# Patient Record
Sex: Female | Born: 1965 | Race: White | Hispanic: No | Marital: Married | Smoking: Never smoker
Health system: Southern US, Community
[De-identification: ages and names within clinical notes are randomized; demographics above are authoritative.]

## PROBLEM LIST (undated history)

## (undated) DIAGNOSIS — E282 Polycystic ovarian syndrome: Secondary | ICD-10-CM

## (undated) DIAGNOSIS — T8339XA Other mechanical complication of intrauterine contraceptive device, initial encounter: Secondary | ICD-10-CM

## (undated) DIAGNOSIS — E119 Type 2 diabetes mellitus without complications: Secondary | ICD-10-CM

## (undated) DIAGNOSIS — F32A Depression, unspecified: Secondary | ICD-10-CM

## (undated) DIAGNOSIS — K219 Gastro-esophageal reflux disease without esophagitis: Secondary | ICD-10-CM

## (undated) DIAGNOSIS — F329 Major depressive disorder, single episode, unspecified: Secondary | ICD-10-CM

## (undated) DIAGNOSIS — G894 Chronic pain syndrome: Secondary | ICD-10-CM

## (undated) DIAGNOSIS — M5030 Other cervical disc degeneration, unspecified cervical region: Secondary | ICD-10-CM

## (undated) DIAGNOSIS — I10 Essential (primary) hypertension: Secondary | ICD-10-CM

## (undated) DIAGNOSIS — Z332 Encounter for elective termination of pregnancy: Secondary | ICD-10-CM

## (undated) DIAGNOSIS — J4520 Mild intermittent asthma, uncomplicated: Secondary | ICD-10-CM

## (undated) DIAGNOSIS — J302 Other seasonal allergic rhinitis: Secondary | ICD-10-CM

## (undated) DIAGNOSIS — Z973 Presence of spectacles and contact lenses: Secondary | ICD-10-CM

## (undated) DIAGNOSIS — R7303 Prediabetes: Secondary | ICD-10-CM

## (undated) DIAGNOSIS — M51369 Other intervertebral disc degeneration, lumbar region without mention of lumbar back pain or lower extremity pain: Secondary | ICD-10-CM

## (undated) DIAGNOSIS — F411 Generalized anxiety disorder: Secondary | ICD-10-CM

## (undated) DIAGNOSIS — M5126 Other intervertebral disc displacement, lumbar region: Secondary | ICD-10-CM

## (undated) DIAGNOSIS — IMO0002 Reserved for concepts with insufficient information to code with codable children: Secondary | ICD-10-CM

## (undated) DIAGNOSIS — M5136 Other intervertebral disc degeneration, lumbar region: Secondary | ICD-10-CM

## (undated) DIAGNOSIS — F419 Anxiety disorder, unspecified: Secondary | ICD-10-CM

## (undated) DIAGNOSIS — I639 Cerebral infarction, unspecified: Secondary | ICD-10-CM

## (undated) HISTORY — DX: Other intervertebral disc displacement, lumbar region: M51.26

## (undated) HISTORY — PX: OTHER SURGICAL HISTORY: SHX169

## (undated) HISTORY — DX: Reserved for concepts with insufficient information to code with codable children: IMO0002

## (undated) HISTORY — DX: Depression, unspecified: F32.A

## (undated) HISTORY — PX: WISDOM TOOTH EXTRACTION: SHX21

## (undated) HISTORY — DX: Cerebral infarction, unspecified: I63.9

## (undated) HISTORY — DX: Anxiety disorder, unspecified: F41.9

## (undated) HISTORY — DX: Encounter for elective termination of pregnancy: Z33.2

## (undated) HISTORY — DX: Major depressive disorder, single episode, unspecified: F32.9

## (undated) HISTORY — DX: Essential (primary) hypertension: I10

---

## 1998-10-11 ENCOUNTER — Inpatient Hospital Stay (HOSPITAL_COMMUNITY): Admission: AD | Admit: 1998-10-11 | Discharge: 1998-10-11 | Payer: Self-pay | Admitting: Gynecology

## 1999-01-09 ENCOUNTER — Ambulatory Visit (HOSPITAL_COMMUNITY): Admission: RE | Admit: 1999-01-09 | Discharge: 1999-01-09 | Payer: Self-pay | Admitting: Gynecology

## 1999-01-09 ENCOUNTER — Encounter: Payer: Self-pay | Admitting: Gynecology

## 1999-02-23 HISTORY — PX: CHOLECYSTECTOMY: SHX55

## 1999-03-04 ENCOUNTER — Other Ambulatory Visit: Admission: RE | Admit: 1999-03-04 | Discharge: 1999-03-04 | Payer: Self-pay | Admitting: Internal Medicine

## 1999-06-09 ENCOUNTER — Encounter: Payer: Self-pay | Admitting: Gynecology

## 1999-06-09 ENCOUNTER — Ambulatory Visit (HOSPITAL_COMMUNITY): Admission: RE | Admit: 1999-06-09 | Discharge: 1999-06-09 | Payer: Self-pay | Admitting: Gynecology

## 1999-07-03 ENCOUNTER — Ambulatory Visit (HOSPITAL_COMMUNITY): Admission: RE | Admit: 1999-07-03 | Discharge: 1999-07-03 | Payer: Self-pay | Admitting: Gynecology

## 1999-07-03 ENCOUNTER — Encounter: Payer: Self-pay | Admitting: Gynecology

## 1999-08-04 ENCOUNTER — Inpatient Hospital Stay (HOSPITAL_COMMUNITY): Admission: AD | Admit: 1999-08-04 | Discharge: 1999-08-04 | Payer: Self-pay | Admitting: Gynecology

## 1999-09-24 ENCOUNTER — Inpatient Hospital Stay (HOSPITAL_COMMUNITY): Admission: AD | Admit: 1999-09-24 | Discharge: 1999-09-26 | Payer: Self-pay | Admitting: Gynecology

## 1999-10-22 ENCOUNTER — Encounter (INDEPENDENT_AMBULATORY_CARE_PROVIDER_SITE_OTHER): Payer: Self-pay | Admitting: Specialist

## 1999-10-22 ENCOUNTER — Ambulatory Visit (HOSPITAL_COMMUNITY): Admission: RE | Admit: 1999-10-22 | Discharge: 1999-10-23 | Payer: Self-pay | Admitting: Surgery

## 1999-11-13 ENCOUNTER — Other Ambulatory Visit: Admission: RE | Admit: 1999-11-13 | Discharge: 1999-11-13 | Payer: Self-pay | Admitting: Gynecology

## 1999-11-22 HISTORY — PX: CHOLECYSTECTOMY, LAPAROSCOPIC: SHX56

## 2000-11-18 ENCOUNTER — Other Ambulatory Visit: Admission: RE | Admit: 2000-11-18 | Discharge: 2000-11-18 | Payer: Self-pay | Admitting: Gynecology

## 2001-03-22 ENCOUNTER — Other Ambulatory Visit: Admission: RE | Admit: 2001-03-22 | Discharge: 2001-03-22 | Payer: Self-pay | Admitting: Radiology

## 2001-11-20 ENCOUNTER — Other Ambulatory Visit: Admission: RE | Admit: 2001-11-20 | Discharge: 2001-11-20 | Payer: Self-pay | Admitting: Gynecology

## 2002-12-11 ENCOUNTER — Other Ambulatory Visit: Admission: RE | Admit: 2002-12-11 | Discharge: 2002-12-11 | Payer: Self-pay | Admitting: Gynecology

## 2004-02-05 ENCOUNTER — Other Ambulatory Visit: Admission: RE | Admit: 2004-02-05 | Discharge: 2004-02-05 | Payer: Self-pay | Admitting: Gynecology

## 2005-04-09 ENCOUNTER — Ambulatory Visit: Payer: Self-pay | Admitting: "Endocrinology

## 2005-05-14 ENCOUNTER — Ambulatory Visit: Payer: Self-pay | Admitting: Family Medicine

## 2005-05-17 ENCOUNTER — Other Ambulatory Visit: Admission: RE | Admit: 2005-05-17 | Discharge: 2005-05-17 | Payer: Self-pay | Admitting: Gynecology

## 2005-12-16 ENCOUNTER — Ambulatory Visit: Payer: Self-pay | Admitting: Specialist

## 2006-01-10 ENCOUNTER — Ambulatory Visit: Payer: Self-pay | Admitting: Family Medicine

## 2006-04-23 ENCOUNTER — Encounter (INDEPENDENT_AMBULATORY_CARE_PROVIDER_SITE_OTHER): Payer: Self-pay | Admitting: Internal Medicine

## 2006-04-23 LAB — CONVERTED CEMR LAB: Hgb A1c MFr Bld: 5.4 %

## 2006-05-05 ENCOUNTER — Encounter (INDEPENDENT_AMBULATORY_CARE_PROVIDER_SITE_OTHER): Payer: Self-pay | Admitting: Internal Medicine

## 2006-07-15 ENCOUNTER — Other Ambulatory Visit: Admission: RE | Admit: 2006-07-15 | Discharge: 2006-07-15 | Payer: Self-pay | Admitting: Gynecology

## 2006-09-22 ENCOUNTER — Encounter (INDEPENDENT_AMBULATORY_CARE_PROVIDER_SITE_OTHER): Payer: Self-pay | Admitting: Internal Medicine

## 2006-10-27 ENCOUNTER — Telehealth (INDEPENDENT_AMBULATORY_CARE_PROVIDER_SITE_OTHER): Payer: Self-pay | Admitting: *Deleted

## 2006-11-11 ENCOUNTER — Encounter (INDEPENDENT_AMBULATORY_CARE_PROVIDER_SITE_OTHER): Payer: Self-pay | Admitting: Internal Medicine

## 2006-12-14 ENCOUNTER — Encounter (INDEPENDENT_AMBULATORY_CARE_PROVIDER_SITE_OTHER): Payer: Self-pay | Admitting: Internal Medicine

## 2006-12-14 DIAGNOSIS — E8881 Metabolic syndrome: Secondary | ICD-10-CM | POA: Insufficient documentation

## 2006-12-14 DIAGNOSIS — E669 Obesity, unspecified: Secondary | ICD-10-CM | POA: Insufficient documentation

## 2006-12-14 DIAGNOSIS — E282 Polycystic ovarian syndrome: Secondary | ICD-10-CM | POA: Insufficient documentation

## 2006-12-14 DIAGNOSIS — E119 Type 2 diabetes mellitus without complications: Secondary | ICD-10-CM

## 2006-12-14 DIAGNOSIS — E042 Nontoxic multinodular goiter: Secondary | ICD-10-CM | POA: Insufficient documentation

## 2006-12-14 DIAGNOSIS — J309 Allergic rhinitis, unspecified: Secondary | ICD-10-CM | POA: Insufficient documentation

## 2006-12-14 DIAGNOSIS — R7303 Prediabetes: Secondary | ICD-10-CM | POA: Insufficient documentation

## 2006-12-14 DIAGNOSIS — L680 Hirsutism: Secondary | ICD-10-CM | POA: Insufficient documentation

## 2006-12-14 DIAGNOSIS — J45909 Unspecified asthma, uncomplicated: Secondary | ICD-10-CM | POA: Insufficient documentation

## 2006-12-14 DIAGNOSIS — F411 Generalized anxiety disorder: Secondary | ICD-10-CM | POA: Insufficient documentation

## 2006-12-14 DIAGNOSIS — F419 Anxiety disorder, unspecified: Secondary | ICD-10-CM | POA: Insufficient documentation

## 2006-12-29 ENCOUNTER — Ambulatory Visit: Payer: Self-pay | Admitting: Family Medicine

## 2007-01-02 LAB — CONVERTED CEMR LAB
ALT: 14 units/L (ref 0–35)
AST: 13 units/L (ref 0–37)
Albumin: 3.2 g/dL — ABNORMAL LOW (ref 3.5–5.2)
Alkaline Phosphatase: 76 units/L (ref 39–117)
BUN: 12 mg/dL (ref 6–23)
Basophils Absolute: 0 10*3/uL (ref 0.0–0.1)
Basophils Relative: 0 % (ref 0.0–1.0)
Bilirubin, Direct: 0.1 mg/dL (ref 0.0–0.3)
CO2: 25 meq/L (ref 19–32)
Calcium: 8.5 mg/dL (ref 8.4–10.5)
Chloride: 108 meq/L (ref 96–112)
Cholesterol: 156 mg/dL (ref 0–200)
Creatinine, Ser: 0.8 mg/dL (ref 0.4–1.2)
Eosinophils Absolute: 0.2 10*3/uL (ref 0.0–0.6)
Eosinophils Relative: 1.4 % (ref 0.0–5.0)
GFR calc Af Amer: 102 mL/min
GFR calc non Af Amer: 84 mL/min
Glucose, Bld: 87 mg/dL (ref 70–99)
HCT: 37.5 % (ref 36.0–46.0)
HDL: 49.5 mg/dL (ref >39.0)
Hemoglobin: 13.4 g/dL (ref 12.0–15.0)
LDL Cholesterol: 82 mg/dL (ref 0–99)
Lymphocytes Relative: 16.5 % (ref 12.0–46.0)
MCHC: 35.7 g/dL (ref 30.0–36.0)
MCV: 88.2 fL (ref 78.0–100.0)
Monocytes Absolute: 0.4 10*3/uL (ref 0.2–0.7)
Monocytes Relative: 4.1 % (ref 3.0–11.0)
Neutro Abs: 8.4 10*3/uL — ABNORMAL HIGH (ref 1.4–7.7)
Neutrophils Relative %: 78 % — ABNORMAL HIGH (ref 43.0–77.0)
Platelets: 261 10*3/uL (ref 150–400)
Potassium: 4.1 meq/L (ref 3.5–5.1)
RBC: 4.25 M/uL (ref 3.87–5.11)
RDW: 11.9 % (ref 11.5–14.6)
Sodium: 140 meq/L (ref 135–145)
TSH: 0.66 microintl units/mL (ref 0.35–5.50)
Total Bilirubin: 0.6 mg/dL (ref 0.3–1.2)
Total CHOL/HDL Ratio: 3.2
Total Protein: 6.5 g/dL (ref 6.0–8.3)
Triglycerides: 125 mg/dL (ref 0–149)
VLDL: 25 mg/dL (ref 0–40)
WBC: 10.8 10*3/uL — ABNORMAL HIGH (ref 4.5–10.5)

## 2007-01-04 ENCOUNTER — Telehealth (INDEPENDENT_AMBULATORY_CARE_PROVIDER_SITE_OTHER): Payer: Self-pay | Admitting: Internal Medicine

## 2007-01-24 ENCOUNTER — Telehealth (INDEPENDENT_AMBULATORY_CARE_PROVIDER_SITE_OTHER): Payer: Self-pay | Admitting: *Deleted

## 2007-06-09 ENCOUNTER — Encounter (INDEPENDENT_AMBULATORY_CARE_PROVIDER_SITE_OTHER): Payer: Self-pay | Admitting: Internal Medicine

## 2007-07-14 ENCOUNTER — Ambulatory Visit: Payer: Self-pay | Admitting: Family Medicine

## 2007-07-14 LAB — CONVERTED CEMR LAB
Bilirubin Urine: NEGATIVE
Blood in Urine, dipstick: NEGATIVE
Glucose, Urine, Semiquant: NEGATIVE
Ketones, urine, test strip: NEGATIVE
Nitrite: NEGATIVE
Protein, U semiquant: NEGATIVE
Specific Gravity, Urine: 1.025
Urobilinogen, UA: 0.2
WBC Urine, dipstick: NEGATIVE
pH: 6

## 2007-07-18 LAB — CONVERTED CEMR LAB
ALT: 15 units/L (ref 0–35)
AST: 17 units/L (ref 0–37)
Albumin: 3.5 g/dL (ref 3.5–5.2)
Alkaline Phosphatase: 78 units/L (ref 39–117)
BUN: 15 mg/dL (ref 6–23)
Bilirubin, Direct: 0.1 mg/dL (ref 0.0–0.3)
CO2: 29 meq/L (ref 19–32)
Calcium: 8.7 mg/dL (ref 8.4–10.5)
Chloride: 103 meq/L (ref 96–112)
Cholesterol: 154 mg/dL (ref 0–200)
Creatinine, Ser: 0.8 mg/dL (ref 0.4–1.2)
GFR calc Af Amer: 102 mL/min
GFR calc non Af Amer: 84 mL/min
Glucose, Bld: 77 mg/dL (ref 70–99)
HDL: 52.2 mg/dL (ref >39.0)
Hgb A1c MFr Bld: 5.9 % (ref 4.6–6.0)
LDL Cholesterol: 76 mg/dL (ref 0–99)
Potassium: 3.8 meq/L (ref 3.5–5.1)
Sodium: 134 meq/L — ABNORMAL LOW (ref 135–145)
TSH: 0.55 microintl units/mL (ref 0.35–5.50)
Total Bilirubin: 0.8 mg/dL (ref 0.3–1.2)
Total CHOL/HDL Ratio: 3
Total Protein: 7.1 g/dL (ref 6.0–8.3)
Triglycerides: 127 mg/dL (ref 0–149)
VLDL: 25 mg/dL (ref 0–40)

## 2007-08-11 ENCOUNTER — Other Ambulatory Visit: Admission: RE | Admit: 2007-08-11 | Discharge: 2007-08-11 | Payer: Self-pay | Admitting: Gynecology

## 2007-09-05 ENCOUNTER — Ambulatory Visit: Payer: Self-pay | Admitting: Family Medicine

## 2007-09-05 DIAGNOSIS — N390 Urinary tract infection, site not specified: Secondary | ICD-10-CM | POA: Insufficient documentation

## 2007-09-05 LAB — CONVERTED CEMR LAB
Bilirubin Urine: NEGATIVE
Glucose, Urine, Semiquant: NEGATIVE
Ketones, urine, test strip: NEGATIVE
Nitrite: POSITIVE
Specific Gravity, Urine: 1.02
Urobilinogen, UA: 0.2
pH: 6.5

## 2007-09-18 ENCOUNTER — Encounter (INDEPENDENT_AMBULATORY_CARE_PROVIDER_SITE_OTHER): Payer: Self-pay | Admitting: Internal Medicine

## 2007-09-18 ENCOUNTER — Ambulatory Visit: Payer: Self-pay | Admitting: Family Medicine

## 2007-09-18 DIAGNOSIS — J069 Acute upper respiratory infection, unspecified: Secondary | ICD-10-CM | POA: Insufficient documentation

## 2007-09-18 DIAGNOSIS — M542 Cervicalgia: Secondary | ICD-10-CM | POA: Insufficient documentation

## 2007-09-18 DIAGNOSIS — R21 Rash and other nonspecific skin eruption: Secondary | ICD-10-CM | POA: Insufficient documentation

## 2007-09-19 LAB — CONVERTED CEMR LAB
Basophils Absolute: 0 10*3/uL (ref 0.0–0.1)
Basophils Relative: 0 % (ref 0–1)
Eosinophils Absolute: 0.5 10*3/uL (ref 0.0–0.7)
Eosinophils Relative: 6 % — ABNORMAL HIGH (ref 0–5)
HCT: 39.6 % (ref 36.0–46.0)
Hemoglobin: 13.3 g/dL (ref 12.0–15.0)
Lymphocytes Relative: 24 % (ref 12–46)
Lymphs Abs: 2 10*3/uL (ref 0.7–4.0)
MCHC: 33.6 g/dL (ref 30.0–36.0)
MCV: 87.4 fL (ref 78.0–100.0)
Monocytes Absolute: 0.4 10*3/uL (ref 0.1–1.0)
Monocytes Relative: 5 % (ref 3–12)
Neutro Abs: 5.5 10*3/uL (ref 1.7–7.7)
Neutrophils Relative %: 64 % (ref 43–77)
Platelets: 267 10*3/uL (ref 150–400)
RBC: 4.53 M/uL (ref 3.87–5.11)
RDW: 12.9 % (ref 11.5–15.5)
WBC: 8.5 10*3/uL (ref 4.0–10.5)

## 2007-09-25 ENCOUNTER — Telehealth (INDEPENDENT_AMBULATORY_CARE_PROVIDER_SITE_OTHER): Payer: Self-pay | Admitting: Internal Medicine

## 2007-10-05 ENCOUNTER — Encounter: Payer: Self-pay | Admitting: Family Medicine

## 2007-11-01 ENCOUNTER — Encounter (INDEPENDENT_AMBULATORY_CARE_PROVIDER_SITE_OTHER): Payer: Self-pay | Admitting: Internal Medicine

## 2007-12-21 ENCOUNTER — Telehealth (INDEPENDENT_AMBULATORY_CARE_PROVIDER_SITE_OTHER): Payer: Self-pay | Admitting: *Deleted

## 2008-02-12 ENCOUNTER — Telehealth (INDEPENDENT_AMBULATORY_CARE_PROVIDER_SITE_OTHER): Payer: Self-pay | Admitting: *Deleted

## 2008-05-03 ENCOUNTER — Encounter (INDEPENDENT_AMBULATORY_CARE_PROVIDER_SITE_OTHER): Payer: Self-pay | Admitting: *Deleted

## 2008-07-04 ENCOUNTER — Telehealth (INDEPENDENT_AMBULATORY_CARE_PROVIDER_SITE_OTHER): Payer: Self-pay | Admitting: Internal Medicine

## 2008-07-23 ENCOUNTER — Telehealth (INDEPENDENT_AMBULATORY_CARE_PROVIDER_SITE_OTHER): Payer: Self-pay | Admitting: Internal Medicine

## 2008-08-22 ENCOUNTER — Telehealth (INDEPENDENT_AMBULATORY_CARE_PROVIDER_SITE_OTHER): Payer: Self-pay | Admitting: Internal Medicine

## 2008-08-27 ENCOUNTER — Telehealth (INDEPENDENT_AMBULATORY_CARE_PROVIDER_SITE_OTHER): Payer: Self-pay | Admitting: Internal Medicine

## 2008-08-28 ENCOUNTER — Encounter: Payer: Self-pay | Admitting: Family Medicine

## 2008-10-10 ENCOUNTER — Encounter: Payer: Self-pay | Admitting: Gynecology

## 2008-10-10 ENCOUNTER — Ambulatory Visit: Payer: Self-pay | Admitting: Gynecology

## 2008-10-10 ENCOUNTER — Other Ambulatory Visit: Admission: RE | Admit: 2008-10-10 | Discharge: 2008-10-10 | Payer: Self-pay | Admitting: Gynecology

## 2008-10-10 ENCOUNTER — Encounter (INDEPENDENT_AMBULATORY_CARE_PROVIDER_SITE_OTHER): Payer: Self-pay | Admitting: Internal Medicine

## 2008-11-01 ENCOUNTER — Encounter (INDEPENDENT_AMBULATORY_CARE_PROVIDER_SITE_OTHER): Payer: Self-pay | Admitting: Internal Medicine

## 2008-11-01 ENCOUNTER — Ambulatory Visit: Payer: Self-pay | Admitting: Gynecology

## 2008-11-01 ENCOUNTER — Encounter (INDEPENDENT_AMBULATORY_CARE_PROVIDER_SITE_OTHER): Payer: Self-pay | Admitting: *Deleted

## 2008-11-12 ENCOUNTER — Encounter (INDEPENDENT_AMBULATORY_CARE_PROVIDER_SITE_OTHER): Payer: Self-pay | Admitting: *Deleted

## 2008-11-21 ENCOUNTER — Telehealth (INDEPENDENT_AMBULATORY_CARE_PROVIDER_SITE_OTHER): Payer: Self-pay | Admitting: Internal Medicine

## 2008-11-29 ENCOUNTER — Encounter (INDEPENDENT_AMBULATORY_CARE_PROVIDER_SITE_OTHER): Payer: Self-pay | Admitting: Internal Medicine

## 2008-12-27 ENCOUNTER — Ambulatory Visit: Payer: Self-pay | Admitting: Family Medicine

## 2008-12-27 DIAGNOSIS — E781 Pure hyperglyceridemia: Secondary | ICD-10-CM | POA: Insufficient documentation

## 2009-09-29 ENCOUNTER — Ambulatory Visit: Payer: Self-pay | Admitting: Gynecology

## 2009-10-16 ENCOUNTER — Other Ambulatory Visit: Admission: RE | Admit: 2009-10-16 | Discharge: 2009-10-16 | Payer: Self-pay | Admitting: Gynecology

## 2009-10-16 ENCOUNTER — Ambulatory Visit: Payer: Self-pay | Admitting: Gynecology

## 2010-01-02 ENCOUNTER — Telehealth: Payer: Self-pay | Admitting: Family Medicine

## 2010-03-22 LAB — CONVERTED CEMR LAB: Hgb A1c MFr Bld: 5.7 %

## 2010-03-24 NOTE — Progress Notes (Signed)
Summary: Needs appt  Phone Note Outgoing Call Call back at Home Phone [phone]   Call placed by: DeShannon Smith CMA Duncan Dull),  January 02, 2010 2:38 PM Call placed to: Patient Summary of Call: calling pt to advise she needs to be seen in order to get refill for Metformin, pt needs to establish with a MD. Initial call taken by: Mervin Hack CMA Duncan Dull),  January 02, 2010 2:38 PM  Follow-up for Phone Call        left message on machine at home for patient to return my call.  DeShannon Smith CMA Duncan Dull)  January 02, 2010 2:38 PM   Patient returned call, appt. scheduled for next week w/ Dr. Dayton Martes. (Patient requested female) Follow-up by: Melody Comas,  January 02, 2010 5:16 PM

## 2010-07-10 NOTE — Op Note (Signed)
[facility]  Patient:    Debra Myers, Debra Myers                        MRN: [id number] Proc. Date: 11/22/99 Adm. Date:  [id number] Disc. Date: [id number] Attending:  Abigail Miyamoto A                           Operative Report  PREOPERATIVE DIAGNOSIS:  Symptomatic cholelithiasis.  POSTOPERATIVE DIAGNOSIS:  Symptomatic cholelithiasis.  PROCEDURE:  Laparoscopic cholecystectomy.  SURGEON:  Douglas A. Magnus Ivan, M.D.  ASSISTANT:  Sandria Bales. Ezzard Standing, M.D.  ANESTHESIA:  General endotracheal anesthesia.  PROCEDURE IN DETAIL:  The patient was brought to the operating room, identified as Debra Myers.  She was placed supine on the operating table and general anesthesia was induced.  Her abdomen was then prepped and draped in usual sterile fashion.  Using a #15 blade, a small transverse incision was made just below the umbilicus.  Incision was carried down to the fascia.  The fascia was opened with a scalpel.  A hemostat was then used to pass into the peritoneal cavity.  Next, a 0 Vicryl purse-string suture was placed around the fascia opening.  The Hasson port was then placed through the opening and insufflation of the abdomen was begun.  Next, an 11 mm port was placed in patients epigastrium and two 5 mm ports were placed in the patients right flank, all under direct vision.  The gallbladder was then grasped and retracted above the liver bed.  Dissection was then carried out at the base. The cystic duct was dissected out and clipped three times proximally and once distally and transected with scissors.  The cystic artery was likewise identified and clipped twice proximally and once distally and transected as well.  The gallbladder was then slowly dissected free from the liver bed with the electrocautery.  Hemostasis appeared to be achieved in the liver bed.  The gallbladder was then completely excised and removed through the port at the umbilicus.  The abdomen was  then thoroughly irrigated.  The 0 Vicryl at the umbilicus was then fastened in place.  All ports were then removed under direct vision.  The abdomen was deflated.  All skin incisions were then anesthetized with 0.25% Marcaine and then closed with 4-0 Monocryl subcuticular stitches.  Steri-Strips, gauze and tape were then applied.  The patient tolerated the procedure well.  All sponge, needle and instrument counts were correct at the end of the procedure.  The patient was then taken in stable condition from the operating room to the recovery room. DD:  11/03/99 TD:  11/04/99 Job: [id number]

## 2010-07-10 NOTE — Discharge Summary (Signed)
[facility]  Patient:    Debra Myers                         MRN: [id number] Adm. Date:  [id number] Attending:  Tonye Royalty                           Discharge Summary  CHIEF COMPLAINT:              Spontaneous rupture of membranes at approximately 0730 hours today.  HISTORY:                      The patient is a 45 year old gravida 2, para 0, AB 1 with an estimated date of confinement of October 10, 1999, currently 37-1/[redacted] weeks gestation, who stated that at approximately 0730 hours this morning she had a rupture of membranes and mild contractions.  She presented to [facility] where she was placed on the monitor. Fetal heart rates were recorded at 130-140 beats per minute.  Reassuring fetal heart rate tracing with contractions every two to three minutes apart.  She had Nitrazine positive and positive pooling that confirmed spontaneous rupture of membranes.  Her cervix is 1.0 to 2.0 cm, 70% effaced, vertex, and -2 station.  She ambulated on the floor and was re-examined and was 3-4, about 80%-90% and effaced, and -3 station.  A bedside ultrasound to confirm presentation, indeed confirmed that it was a vertex presentation.  The patients prenatal course is significant for the fact that she has had a history of cholelithiasis and had a general surgical consultation, for which a cholecystectomy is planned after the postpartum.  She has also had a history of postpartum depression in the past as well.  She otherwise besides her occasional flare-ups of her colic attacks, and had done otherwise well during her pregnancy.  She has had normal liver function tests as well during her follow-up visits.  PAST MEDICAL HISTORY:         1. History of cholelithiasis in 1987.                               2. Had a therapeutic abortion.  ALLERGIES:                    Denied.  SOCIAL HISTORY:               She is a Engineer, civil (consulting).  PAST SURGICAL HISTORY:         A wisdom tooth removed in 1986.  REVIEW OF SYSTEMS:            See Holister form.  PHYSICAL EXAMINATION:  VITAL SIGNS:                  Blood pressure 143/76, respirations 18, pulse 90, temperature 99.3 degrees.  LUNGS:                        Clear to auscultation.  No rhonchi or wheezes.  HEART:                        A regular rate and rhythm, no murmurs or gallops.  ABDOMEN:  Gravid uterus, vertex presentation by bilateral ________ maneuver, confirmed by bedside ultrasound.  Positive fetal heart tones.  PELVIC:                       Is 3.0 to 4.0 cm, 80%-90% effaced, -3 station. Clear fluid noticed in the vaginal pooling, a -2 to -3 station.  EXTREMITIES:                  Deep tendon reflexes 1+, negative clonus.  PRENATAL LABORATORY DATA:      Blood type A-positive, negative antibody screen, VDRL nonreactive.  Rubella immune.  Hepatitis-B surface antigen and HIV negative.  She had declined a maternal serum alpha fetoprotein.  Diabetes screen was normal.  Group-B Streptococcus culture was negative, and a Pap smear was negative.  ASSESSMENT:                   A 45 year old gravida 2, para 0, AB 1, at                               37-1/2 weeks estimated gestational age with                               spontaneous rupture of membranes at 0730 hours                               this morning was clear.  Patient with                               group-B Streptococcus culture proven to                               be negative.  An uneventful prenatal course                               with the exception of a history of                               cholelithiasis, with occasional episodes of                               right upper quadrant discomfort.  Follow-up                               liver function tests have been normal.  PLAN:                         To proceed with a cholecystectomy after the six-week postpartum visit.  Admit to labor  and delivery.  Pitocin augmentation in the event of protracted labor.  Anticipate a vaginal delivery. DD:  09/24/99 TD:  09/24/99 Job: [id number]

## 2010-10-19 ENCOUNTER — Other Ambulatory Visit: Payer: Self-pay | Admitting: Gynecology

## 2010-11-19 ENCOUNTER — Other Ambulatory Visit: Payer: Self-pay | Admitting: Gynecology

## 2010-11-23 ENCOUNTER — Other Ambulatory Visit: Payer: Self-pay | Admitting: *Deleted

## 2010-11-23 MED ORDER — SPIRONOLACTONE 25 MG PO TABS: 25.0000 mg | ORAL_TABLET | Freq: Every day | ORAL | Status: DC

## 2010-11-23 NOTE — Telephone Encounter (Signed)
Overdue for annual exam.

## 2010-12-21 ENCOUNTER — Other Ambulatory Visit: Payer: Self-pay | Admitting: Gynecology

## 2010-12-25 ENCOUNTER — Encounter: Payer: Self-pay | Admitting: Gynecology

## 2010-12-25 ENCOUNTER — Ambulatory Visit (INDEPENDENT_AMBULATORY_CARE_PROVIDER_SITE_OTHER): Payer: PRIVATE HEALTH INSURANCE

## 2010-12-25 ENCOUNTER — Ambulatory Visit (INDEPENDENT_AMBULATORY_CARE_PROVIDER_SITE_OTHER): Payer: PRIVATE HEALTH INSURANCE | Admitting: Gynecology

## 2010-12-25 ENCOUNTER — Other Ambulatory Visit (HOSPITAL_COMMUNITY)
Admission: RE | Admit: 2010-12-25 | Discharge: 2010-12-25 | Disposition: A | Discharge status: Home / Self Care | Payer: PRIVATE HEALTH INSURANCE | Source: Ambulatory Visit | Attending: Gynecology | Admitting: Gynecology

## 2010-12-25 VITALS — BP 128/80 | Ht 64.5 in | Wt 258.0 lb

## 2010-12-25 DIAGNOSIS — N949 Unspecified condition associated with female genital organs and menstrual cycle: Secondary | ICD-10-CM

## 2010-12-25 DIAGNOSIS — IMO0001 Reserved for inherently not codable concepts without codable children: Secondary | ICD-10-CM

## 2010-12-25 DIAGNOSIS — Z309 Encounter for contraceptive management, unspecified: Secondary | ICD-10-CM

## 2010-12-25 DIAGNOSIS — I10 Essential (primary) hypertension: Secondary | ICD-10-CM

## 2010-12-25 DIAGNOSIS — Z01419 Encounter for gynecological examination (general) (routine) without abnormal findings: Secondary | ICD-10-CM

## 2010-12-25 DIAGNOSIS — R635 Abnormal weight gain: Secondary | ICD-10-CM

## 2010-12-25 DIAGNOSIS — E119 Type 2 diabetes mellitus without complications: Secondary | ICD-10-CM

## 2010-12-25 DIAGNOSIS — R102 Pelvic and perineal pain: Secondary | ICD-10-CM

## 2010-12-25 LAB — COMPREHENSIVE METABOLIC PANEL WITH GFR
ALT: 15 U/L (ref 0–35)
AST: 14 U/L (ref 0–37)
Albumin: 3.9 g/dL (ref 3.5–5.2)
Alkaline Phosphatase: 91 U/L (ref 39–117)
BUN: 10 mg/dL (ref 6–23)
CO2: 24 meq/L (ref 19–32)
Calcium: 8.6 mg/dL (ref 8.4–10.5)
Chloride: 106 meq/L (ref 96–112)
Creat: 0.76 mg/dL (ref 0.50–1.10)
Glucose, Bld: 81 mg/dL (ref 70–99)
Potassium: 4.2 meq/L (ref 3.5–5.3)
Sodium: 141 meq/L (ref 135–145)
Total Bilirubin: 0.3 mg/dL (ref 0.3–1.2)
Total Protein: 6.5 g/dL (ref 6.0–8.3)

## 2010-12-25 LAB — HEMOGLOBIN A1C
Hgb A1c MFr Bld: 5.8 % — ABNORMAL HIGH
Mean Plasma Glucose: 120 mg/dL — ABNORMAL HIGH

## 2010-12-25 MED ORDER — NORETHINDRONE ACET-ETHINYL EST 1-20 MG-MCG PO TABS: 1.0000 | ORAL_TABLET | Freq: Every day | ORAL | Status: DC

## 2010-12-25 MED ORDER — DESOGESTREL-ETHINYL ESTRADIOL 0.15-0.02/0.01 MG (21/5) PO TABS: 1.0000 | ORAL_TABLET | Freq: Every day | ORAL | Status: DC

## 2010-12-25 NOTE — Progress Notes (Signed)
RICK WARNICK 08-22-1965 [id number]   History:    45 y.o.  for annual exam with some complaints of on and off right lower quadrant discomfort. The patient is overweight. She's been followed by her family physician for type 2 diabetes for which she is on metformin 500 mg twice a day. She has not seen him in over 3 years. She also for hypertension she has been taking spironolactone. She's also been on low dose oral contraceptive pill it appears that she's on dispirinone known as a progestational agent. She reports her cycles being normal.  Past medical history,surgical history, family history and social history were all reviewed and documented in the EPIC chart.  ROS:  Was performed and pertinent positives and negatives are included in the history.  Exam: chaperone present BP 128/80  Ht 5' 4.5" (1.638 m)  Wt 258 lb (117.028 kg)  BMI 43.60 kg/m2  LMP 12/18/2010  Body mass index is 43.60 kg/(m^2).  General appearance : Well developed well nourished female. No acute distress HEENT: Neck supple, trachea midline, no carotid bruits, no thyroidmegaly Lungs: Clear to auscultation, no rhonchi or wheezes, or rib retractions  Heart: Regular rate and rhythm, no murmurs or gallops Breast:Examined in sitting and supine position were symmetrical in appearance, no palpable masses or tenderness,  no skin retraction, no nipple inversion, no nipple discharge, no skin discoloration, no axillary or supraclavicular lymphadenopathy Abdomen: no palpable masses or tenderness, no rebound or guarding Extremities: no edema or skin discoloration or tenderness  Pelvic:  Bartholin, Urethra, Skene Glands: Within normal limits             Vagina: No gross lesions or discharge  Cervix: No gross lesions or discharge  Uterus  anteverted no limited due to patient's abdominal girth, normal size, shape and consistency, non-tender and mobile  Adnexa  Without masses or tenderness  Anus and perineum  normal   Rectovaginal   normal sphincter tone without palpated masses or tenderness             Hemoccult not done     Assessment/Plan:  45 y.o. female for annual exam with complaints of some mild right lower quadrant discomfort. Due to patient's weight an abdominal girth would proceed with an ultrasound today which demonstrated an ultrasound with a uterus that measured 8.7 x 5.0 x 3.9 cm with an endometrial stripe of 3.7 mm she had a left ovarian pedicle free thin-walled avascular cyst measuring 29 x 25 x 24 mm right ovary and solid avascular cyst measuring 11 x 9 x 9 mm slightly smaller than the previous scan August 2011 and no free fluid was noted. Patient was encouraged to follow up with her internist has been 3 years and she would like to see a different internist. We will refer her to one of our internal medicine  colleague in the community. We will check a hemoglobin A1c will also check a CBC, TSH, total cholesterol, and comprehensive metabolic panel along with a urinalysis and Pap smear. Her last mammogram was in September 2011 she will schedule her for this month. She was encouraged to continue monthly self breast examination. Will notify her as any abnormality within the above mentioned test otherwise we'll see her back in one year or when necessary.  Ok Edwards MD, 1:53 PM 12/25/2010

## 2010-12-30 ENCOUNTER — Other Ambulatory Visit: Payer: Self-pay | Admitting: *Deleted

## 2010-12-30 DIAGNOSIS — R7309 Other abnormal glucose: Secondary | ICD-10-CM

## 2011-01-13 ENCOUNTER — Other Ambulatory Visit (INDEPENDENT_AMBULATORY_CARE_PROVIDER_SITE_OTHER): Payer: PRIVATE HEALTH INSURANCE | Admitting: *Deleted

## 2011-01-13 ENCOUNTER — Encounter: Payer: Self-pay | Admitting: Gynecology

## 2011-01-13 DIAGNOSIS — R7309 Other abnormal glucose: Secondary | ICD-10-CM

## 2011-01-13 DIAGNOSIS — R7989 Other specified abnormal findings of blood chemistry: Secondary | ICD-10-CM

## 2011-05-27 ENCOUNTER — Telehealth: Payer: Self-pay | Admitting: Internal Medicine

## 2011-05-27 NOTE — Telephone Encounter (Signed)
Yes please make this a 30 min appt

## 2011-05-27 NOTE — Telephone Encounter (Signed)
Pt is calling and was a previous Debra Myers pt last seen 02/22/2009 and she would like to establish with you. She was wondering if this would be ok and where I could put her on the schedule.

## 2011-06-11 ENCOUNTER — Ambulatory Visit: Payer: PRIVATE HEALTH INSURANCE | Admitting: Family Medicine

## 2011-07-09 ENCOUNTER — Encounter: Payer: Self-pay | Admitting: Family Medicine

## 2011-07-09 ENCOUNTER — Ambulatory Visit (INDEPENDENT_AMBULATORY_CARE_PROVIDER_SITE_OTHER): Payer: PRIVATE HEALTH INSURANCE | Admitting: Family Medicine

## 2011-07-09 VITALS — BP 120/82 | Temp 98.1°F | Resp 88 | Ht 64.25 in | Wt 264.0 lb

## 2011-07-09 DIAGNOSIS — E781 Pure hyperglyceridemia: Secondary | ICD-10-CM

## 2011-07-09 DIAGNOSIS — E042 Nontoxic multinodular goiter: Secondary | ICD-10-CM

## 2011-07-09 DIAGNOSIS — E119 Type 2 diabetes mellitus without complications: Secondary | ICD-10-CM

## 2011-07-09 DIAGNOSIS — I10 Essential (primary) hypertension: Secondary | ICD-10-CM | POA: Insufficient documentation

## 2011-07-09 LAB — LIPID PANEL
HDL: 55 mg/dL (ref >39)
Triglycerides: 246 mg/dL — ABNORMAL HIGH (ref <150)
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
## 2011-07-09 NOTE — Patient Instructions (Signed)
Great to meet you. Please make an appointment for physical for your convenience.

## 2011-07-12 ENCOUNTER — Other Ambulatory Visit: Payer: Self-pay | Admitting: *Deleted

## 2011-07-12 MED ORDER — METFORMIN HCL 500 MG PO TABS: 500.0000 mg | ORAL_TABLET | Freq: Two times a day (BID) | ORAL | Status: DC

## 2011-07-15 ENCOUNTER — Telehealth: Payer: Self-pay

## 2011-07-15 NOTE — Telephone Encounter (Signed)
Pt request lab orders to be faxed to [facility] fax # [phone]. Pt scheduled CPX 08/17/11.Pt wants labs drawn prior to CPX at [facility].Please advise.

## 2011-07-16 NOTE — Telephone Encounter (Signed)
Orders written, in my box. 

## 2011-07-16 NOTE — Telephone Encounter (Signed)
Order faxed.

## 2011-08-17 ENCOUNTER — Encounter: Payer: Self-pay | Admitting: Family Medicine

## 2011-08-17 ENCOUNTER — Ambulatory Visit (INDEPENDENT_AMBULATORY_CARE_PROVIDER_SITE_OTHER): Payer: PRIVATE HEALTH INSURANCE | Admitting: Family Medicine

## 2011-08-17 VITALS — BP 112/68 | HR 92 | Temp 98.0°F | Ht 64.5 in | Wt 263.0 lb

## 2011-08-17 DIAGNOSIS — E119 Type 2 diabetes mellitus without complications: Secondary | ICD-10-CM

## 2011-08-17 DIAGNOSIS — I10 Essential (primary) hypertension: Secondary | ICD-10-CM

## 2011-08-17 DIAGNOSIS — Z0000 Encounter for general adult medical examination without abnormal findings: Secondary | ICD-10-CM
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
## 2011-08-17 NOTE — Patient Instructions (Addendum)
Great to see you. Please send me your lab results when you get them.

## 2011-10-12 ENCOUNTER — Other Ambulatory Visit: Payer: Self-pay

## 2011-10-12 MED ORDER — HYDROCODONE-ACETAMINOPHEN 5-325 MG PO TABS: 1.0000 | ORAL_TABLET | Freq: Two times a day (BID) | ORAL | Status: DC

## 2011-10-12 NOTE — Telephone Encounter (Signed)
Pt left v/m requesting refill hydrocodone-apap to [facility] pt has first pain clinic appt 10/20/11. Pt request call back.

## 2011-10-12 NOTE — Telephone Encounter (Signed)
Medicine called to pharmacy. 

## 2011-10-12 NOTE — Telephone Encounter (Signed)
Ok to refill one month only. No further refills.

## 2011-10-20 ENCOUNTER — Ambulatory Visit: Payer: Self-pay | Admitting: Pain Medicine

## 2011-10-29 ENCOUNTER — Other Ambulatory Visit: Payer: Self-pay

## 2011-10-29 MED ORDER — HYDROCODONE-ACETAMINOPHEN 5-325 MG PO TABS: 1.0000 | ORAL_TABLET | Freq: Two times a day (BID) | ORAL | Status: DC

## 2011-10-29 MED ORDER — SPIRONOLACTONE 25 MG PO TABS: 25.0000 mg | ORAL_TABLET | Freq: Every day | ORAL | Status: DC

## 2011-10-29 NOTE — Telephone Encounter (Signed)
Medicine called to [facility], left message on cell phone voice mail advising patient.

## 2011-10-29 NOTE — Telephone Encounter (Signed)
Pt states last filled 10/12/11 form # 30.  States she takes one BID, tries to take less if she can.

## 2011-10-29 NOTE — Telephone Encounter (Signed)
Pt request refill Hydrocodone to [facility] pain clinic will not prescribe meds until complete work up is done; pt scheduled for MRI and psych eval. Pt will be out of med Tues and pt request call back.Please advise.

## 2011-10-29 NOTE — Telephone Encounter (Signed)
When was it last refilled?

## 2011-10-29 NOTE — Telephone Encounter (Signed)
Pt request refill spironolactone 25 mg to [facility]. Pt notified done while on phone.

## 2011-11-05 ENCOUNTER — Telehealth: Payer: Self-pay

## 2011-11-05 ENCOUNTER — Other Ambulatory Visit: Payer: Self-pay | Admitting: Family Medicine

## 2011-11-05 NOTE — Telephone Encounter (Signed)
Refill request denied.  We just refilled her Norco which has the same amount of hydrocodone in it as Vicodin 5-500.

## 2011-11-05 NOTE — Telephone Encounter (Signed)
Pt left v/m getting test at pain clinic; Norco 5-325 not holding pain; previously pt given Vicodin 5-500 and that seemed to help pain more.[facility] [address]Please advise.

## 2011-11-08 ENCOUNTER — Ambulatory Visit: Payer: Self-pay | Admitting: Pain Medicine

## 2011-11-08 NOTE — Telephone Encounter (Signed)
Left message asking pt to call back. 

## 2011-11-08 NOTE — Telephone Encounter (Signed)
Advised patient. She says the pharmacist told her that since the meds are generic, and made by different manufacturers, that's why the norco doesn't work as well as the vicodin.  Pt said she would be glad to bring the norco that she has left back here for disposal.  Please advise.

## 2011-11-08 NOTE — Telephone Encounter (Signed)
Advised patient, she will come in tomorrow.

## 2011-11-08 NOTE — Telephone Encounter (Signed)
Please have her bring in Norco and then we can send in rx.

## 2011-11-10 MED ORDER — HYDROCODONE-ACETAMINOPHEN 5-300 MG PO TABS: ORAL_TABLET | ORAL | Status: DC

## 2011-11-10 NOTE — Telephone Encounter (Signed)
Medicine called to pharmacy, changed on med list.

## 2011-11-10 NOTE — Telephone Encounter (Signed)
Ok to send in 30 day supply of vicodin as requested to her pharmacy.

## 2011-11-10 NOTE — Addendum Note (Signed)
Addended by: Eliezer Bottom on: 11/10/2011 09:37 AM   Modules accepted: Orders

## 2011-11-10 NOTE — Telephone Encounter (Signed)
Pt has brought in her bottle of norco 5/325 for disposal and wants new script for vicodin 5/300, what she has had before,  [facility] to [facility] [address]  See notes below.

## 2011-11-24 ENCOUNTER — Ambulatory Visit: Payer: Self-pay | Admitting: Pain Medicine

## 2011-11-30 ENCOUNTER — Ambulatory Visit: Payer: Self-pay | Admitting: Pain Medicine

## 2011-12-22 ENCOUNTER — Ambulatory Visit: Payer: Self-pay | Admitting: Pain Medicine

## 2011-12-26 ENCOUNTER — Other Ambulatory Visit: Payer: Self-pay | Admitting: Gynecology

## 2011-12-27 NOTE — Telephone Encounter (Signed)
Pt due for annual exam.

## 2012-01-06 ENCOUNTER — Ambulatory Visit: Payer: Self-pay | Admitting: Pain Medicine

## 2012-01-25 ENCOUNTER — Ambulatory Visit: Payer: Self-pay | Admitting: Pain Medicine

## 2012-02-08 ENCOUNTER — Encounter: Payer: PRIVATE HEALTH INSURANCE | Admitting: Gynecology

## 2012-02-09 ENCOUNTER — Ambulatory Visit: Payer: Self-pay | Admitting: Pain Medicine

## 2012-02-11 ENCOUNTER — Ambulatory Visit (INDEPENDENT_AMBULATORY_CARE_PROVIDER_SITE_OTHER): Payer: PRIVATE HEALTH INSURANCE | Admitting: Family Medicine

## 2012-02-11 ENCOUNTER — Encounter: Payer: Self-pay | Admitting: Family Medicine

## 2012-02-11 VITALS — BP 118/80 | HR 88 | Temp 97.9°F | Wt 265.0 lb

## 2012-02-11 DIAGNOSIS — E669 Obesity, unspecified: Secondary | ICD-10-CM

## 2012-02-11 NOTE — Patient Instructions (Addendum)
Good to see you. Please stop by to see Debra Myers on your way out.  Have a wonderful hliday.
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
## 2012-02-14 ENCOUNTER — Ambulatory Visit (INDEPENDENT_AMBULATORY_CARE_PROVIDER_SITE_OTHER): Payer: PRIVATE HEALTH INSURANCE | Admitting: Gynecology

## 2012-02-14 ENCOUNTER — Encounter: Payer: Self-pay | Admitting: Gynecology

## 2012-02-14 VITALS — BP 132/58 | Ht 64.0 in | Wt 266.0 lb

## 2012-02-14 DIAGNOSIS — N83209 Unspecified ovarian cyst, unspecified side: Secondary | ICD-10-CM | POA: Insufficient documentation

## 2012-02-14 DIAGNOSIS — Z01419 Encounter for gynecological examination (general) (routine) without abnormal findings: Secondary | ICD-10-CM

## 2012-02-14 DIAGNOSIS — I70209 Unspecified atherosclerosis of native arteries of extremities, unspecified extremity: Secondary | ICD-10-CM

## 2012-02-14 DIAGNOSIS — E1159 Type 2 diabetes mellitus with other circulatory complications: Secondary | ICD-10-CM

## 2012-02-14 DIAGNOSIS — E1151 Type 2 diabetes mellitus with diabetic peripheral angiopathy without gangrene: Secondary | ICD-10-CM

## 2012-02-14 DIAGNOSIS — E663 Overweight: Secondary | ICD-10-CM | POA: Insufficient documentation

## 2012-02-14 LAB — HEMOGLOBIN A1C: Mean Plasma Glucose: 128 mg/dL — ABNORMAL HIGH (ref <117)

## 2012-02-14 MED ORDER — DESOGESTREL-ETHINYL ESTRADIOL 0.15-0.02/0.01 MG (21/5) PO TABS: 1.0000 | ORAL_TABLET | Freq: Every day | ORAL | Status: DC

## 2012-02-14 NOTE — Progress Notes (Signed)
Debra Myers 1965-10-03 [id number]   History:    46 y.o.  for annual gyn exam with no complaints today. Her primary physician Dr. Dayton Martes has been following patient for her type 2 diabetes and hypertension and has been doing her lab work. She is in the process of being considered for lap banding as a result of her morbid obesity. Review of her record indicated that in November 2012 she had a left ovarian cyst which was echo-free measuring 2.9 x 2.5 x 2.4 cm an ACE small right ovarian cyst that measured 11 x 9 x 9 mm was avascular as well. Patient no prior history of abnormal Pap smear last was in 2012. Her last mammogram was in November 2012 as well as reported to be normal. She is having normal menstrual cycles and is on Viorele oral contraceptive pill.  Past medical history,surgical history, family history and social history were all reviewed and documented in the EPIC chart.  Gynecologic History Patient's last menstrual period was 01/17/2012. Contraception: OCP (estrogen/progesterone) Last Pap: 2012. Results were: normal Last mammogram: 2012. Results were: normal  Obstetric History OB History    Grav Para Term Preterm Abortions TAB SAB Ect Mult Living   2 1 1  1  1   1      # Outc Date GA Lbr Len/2nd Wgt Sex Del Anes PTL Lv   1 TRM     F SVD   Yes   2 SAB                ROS: A ROS was performed and pertinent positives and negatives are included in the history.  GENERAL: No fevers or chills. HEENT: No change in vision, no earache, sore throat or sinus congestion. NECK: No pain or stiffness. CARDIOVASCULAR: No chest pain or pressure. No palpitations. PULMONARY: No shortness of breath, cough or wheeze. GASTROINTESTINAL: No abdominal pain, nausea, vomiting or diarrhea, melena or bright red blood per rectum. GENITOURINARY: No urinary frequency, urgency, hesitancy or dysuria. MUSCULOSKELETAL: No joint or muscle pain, no back pain, no recent trauma. DERMATOLOGIC: No rash, no itching, no lesions.  ENDOCRINE: No polyuria, polydipsia, no heat or cold intolerance. No recent change in weight. HEMATOLOGICAL: No anemia or easy bruising or bleeding. NEUROLOGIC: No headache, seizures, numbness, tingling or weakness. PSYCHIATRIC: No depression, no loss of interest in normal activity or change in sleep pattern.     Exam: chaperone present  BP 132/58  Ht 5\' 4"  (1.626 m)  Wt 266 lb (120.657 kg)  BMI 45.66 kg/m2  LMP 01/17/2012  Body mass index is 45.66 kg/(m^2).  General appearance : Well developed well nourished female. No acute distress HEENT: Neck supple, trachea midline, no carotid bruits, no thyroidmegaly Lungs: Clear to auscultation, no rhonchi or wheezes, or rib retractions  Heart: Regular rate and rhythm, no murmurs or gallops Breast:Examined in sitting and supine position were symmetrical in appearance, no palpable masses or tenderness,  no skin retraction, no nipple inversion, no nipple discharge, no skin discoloration, no axillary or supraclavicular lymphadenopathy Abdomen: no palpable masses or tenderness, no rebound or guarding Extremities: no edema or skin discoloration or tenderness  Pelvic:  Bartholin, Urethra, Skene Glands: Within normal limits             Vagina: No gross lesions or discharge  Cervix: No gross lesions or discharge  Uterus  anteverted, normal size, shape and consistency, non-tender and mobile  Adnexa  Without masses or tenderness  Anus and perineum  normal  Rectovaginal  normal sphincter tone without palpated masses or tenderness             Hemoccult not done     Assessment/Plan:  46 y.o. female for annual exam who is morbidly obese and is being treated for hypertension diabetes my her primary care physician. She is in the process of being considered for lap banding procedure. We'll check her hemoglobin A1c in her TSH today. We did discuss the new Pap smear screening guidelines and no Pap smear was done today. She is scheduled to return back to the  office for an ultrasound next month for followup on these ovarian cyst. She had otherwise been asymptomatic. We discussed importance of calcium and vitamin D and regular exercise as well for osteoporosis prevention. She is due for mammogram next year.    Ok Edwards MD, 11:31 PM 02/14/2012

## 2012-02-14 NOTE — Patient Instructions (Addendum)
Bariatric Surgery (Gastrointestinal Surgery for Severe Obesity)  Severe obesity is a longstanding condition. It is difficult to treat through diet and exercise alone. Gastrointestinal surgery is the best option for people who are severely obese and cannot lose weight by traditional means, or who suffer from serious obesity-related health problems. The surgery promotes weight loss by decreasing the absorption of food and, in some operations, interrupting the digestive process. As in other treatments for obesity, the best results are achieved with healthy eating behaviors and regular physical activity.    People who may consider gastrointestinal surgery include those with a body mass index (BMI) above 40. This is about 100 pounds of overweight for men and 80 pounds for women. People with a BMI between 35 and 40 and who suffer from type 2 diabetes or life-threatening cardiopulmonary (heart and lung) problems, such as severe sleep apnea or obesity-related heart disease, may also be candidates for surgery. (To use the Body Mass Index chart. find your weight on the bottom of the graph. Go straight up from that point until you come to the line that matches your height. Then look to find your weight group).   The idea of gastrointestinal surgery to control obesity grew out of results of operations for cancer or severe ulcers that removed large portions of the stomach or small intestine. Patients undergoing these procedures tended to lose weight after surgery. So some physicians began to use such operations to treat severe obesity. The first operation that was widely used for severe obesity was the intestinal bypass. This operation was first used 40 years ago. It produced weight loss by causing malabsorption. The idea was that patients could eat large amounts of food, which would be poorly digested or passed along too fast for the body to absorb many calories. The problem with this surgery was that it caused a loss of essential nutrients. Also, its side effects were unpredictable and sometimes fatal. The original form of the intestinal bypass operation is no longer used.  THE NORMAL DIGESTIVE PROCESS  Normally, as food moves along the digestive tract, digestive juices and enzymes digest and absorb calories and nutrients. After we chew and swallow our food, it moves down the esophagus to the stomach. There a strong acid continues the digestive process. The stomach can hold about 3 pints of food at one time. When the stomach contents move to the first portion of the small intestine (duodenum ), bile and pancreatic juice speed up digestion. Most of the iron and calcium in the foods we eat is absorbed in the duodenum. The jejunum and ileum are the remaining two segments of the nearly 20 feet of small intestine. They complete the absorption of almost all calories and nutrients. The food particles that cannot be digested in the small intestine are stored in the large intestine until eliminated.    HOW DOES SURGERY PROMOTE WEIGHT LOSS?   Gastrointestinal surgery for obesity is also called bariatric surgery. It alters the digestive process. The operations promote weight loss by closing off parts of the stomach. This will make it smaller. Operations that only reduce stomach size are known as "restrictive operations". They restrict the amount of food the stomach can hold.  Some operations combine stomach restriction with a partial bypass of the small intestine. These procedures create a direct connection from the stomach to the lower segment of the small intestine. This causes bypassing portions of the digestive tract that absorb calories and nutrients. These are known as malabsorptive operations.    WHAT ARE THE SURGICAL OPTIONS?  There are several types of restrictive and malabsorptive operations. Each one carries its own benefits and risks.    Restrictive Operations   Restrictive operations serve only to restrict food intake. They do not interfere with the normal digestive process. To perform the surgery, doctors create a small pouch at the top of the stomach where food enters from the esophagus. At first, the pouch holds about 1 ounce of food. It later expands to 2-3 ounces. The lower outlet of the pouch usually has a diameter of only about  inch. This small outlet delays the emptying of food from the pouch and causes a feeling of fullness. As a result of this surgery, most people lose the ability to eat large amounts of food at one time. After an operation, the person usually can eat only  to 1 cup of food without discomfort or nausea. Also, food has to be well chewed. Restrictive operations for obesity include adjustable gastric banding (AGB) and vertical banded gastroplasty (VBG).    Adjustable gastric banding  In this procedure, a hollow band made of special material is placed around the stomach near its upper end. This creates a small pouch and a narrow passage into the larger remainder of the stomach. The band is then inflated with a salt solution. It can be tightened or loosened over time to change the size of the passage by increasing or decreasing the amount of salt solution.   The band is adjusted based on feelings of hunger and weight loss. Patients decide when they need an adjustment and come to their surgeons to evaluate this. The adjustment is done as an office visit. The band is fully reversible with a second surgery if the patient changes his/her mind. There is no cutting or re-routing of the intestine.   Vertical banded gastroplasty  VBG has been the most common restrictive operation for weight control. Both a band and staples are used to create a small stomach pouch. Vertical banded gastroplasty is based on the same principle of restriction as the band. But the stomach is surgically altered with the stapling. This treatment is not reversible.   Restrictive operations lead to weight loss in almost all patients. But they are less successful than malabsorptive operations in achieving substantial, long-term weight loss. About 30 percent of those who undergo VBG achieve normal weight. About 80 percent achieve some degree of weight loss. Some patients regain weight. Others are unable to adjust their eating habits and fail to lose the desired weight. Successful results depend on the patient's willingness to adopt a long-term plan of healthy eating and regular physical activity.    A common risk of restrictive operations is vomiting. This is caused when the small stomach is overly stretched by food particles that have not been chewed well. Band slippage and saline leakage have been reported after AGB. Risks of VBG include wearing away of the band and breakdown of the staple line. In a small number of cases, stomach juices may leak into the abdomen. This requires an emergency operation. In less than 1 percent of all cases, infection or death from complications may occur.  Malabsorptive Operations   Malabsorptive operations are the most common gastrointestinal surgeries for weight loss. They restrict both food intake and the amount of calories and nutrients the body absorbs.   Roux-en-Y gastric bypass (RGB)  This operation is the most common and successful malabsorptive surgery. First, a small stomach pouch is created to restrict food   intake. Next, a Y-shaped section of the small intestine is attached to the pouch. This allows food to bypass the lower stomach, the first segment of the small intestine (duodenum), and the first portion of the jejunum (the second segment of the small intestine). This bypass reduces the amount of calories and nutrients the body absorbs.   Biliopancreatic diversion (BPD)  In this more complicated malabsorptive operation, portions of the stomach are removed. The small pouch that remains is connected directly to the final segment of the small intestine, completely bypassing the duodenum and the jejunum. This procedure successfully promotes weight loss. But it is less frequently used than other types of surgery because of the high risk for nutritional deficiencies. A variation of BPD includes a "duodenal switch". This leaves a larger portion of the stomach intact, including the pyloric valve. This valve regulates the release of stomach contents into the small intestine. It also keeps a small part of the duodenum in the digestive pathway.    Malabsorptive operations produce more weight loss than restrictive operations. And they are more effective in reversing the health problems associated with severe obesity. Patients who have malabsorptive operations generally lose two-thirds of their excess weight within 2 years.   In addition to the risks of restrictive surgeries, malabsorptive operations also carry greater risk for nutritional deficiencies. This is because the procedure causes food to bypass the duodenum and jejunum. That is where most iron and calcium are absorbed. Menstruating women may develop anemia because not enough vitamin B12 and iron are absorbed. Decreased absorption of calcium may also bring on osteoporosis and metabolic bone disease. Patients are required to take nutritional supplements that usually prevent these deficiencies. Patients who have the biliopancreatic diversion surgery must also take fat-soluble (dissolved by fat) vitamins A, D, E, and K supplements.   RGB and BPD operations may also cause "dumping syndrome". This means that stomach contents move too rapidly through the small intestine. Symptoms include nausea, weakness, sweating, faintness, and sometimes diarrhea after eating. The duodenal switch operation keeps the pyloric valve intact. So it may reduce the likelihood of dumping syndrome.   The more extensive the bypass, the greater the risk is for complications and nutritional deficiencies. Patients with extensive bypasses of the normal digestive process require close monitoring. They also need life-long use of special foods, supplements, and medications.  EXPLORE BENEFITS AND RISKS  Surgery to produce weight loss is a serious undertaking. Anyone thinking about surgery should understand what the operation involves. Patients and physicians should carefully consider the following benefits and risks.    Benefits    Right after surgery, most patients lose weight quickly. They continue to lose for 18 to 24 months after the procedure. Most patients regain 5 to 10 percent of the weight they lost. But many maintain a long-term weight loss of about 100 pounds.   Surgery improves most obesity-related conditions. For example, in one study blood sugar levels of 83 percent of obese patients with diabetes returned to normal after surgery. Nearly all patients whose blood sugar levels did not return to normal were older. Or they had lived with diabetes for a long time.  Risks   Ten to 20 percent of patients who have weight-loss surgery require follow-up operations to correct complications. Abdominal hernia was the most common complication requiring follow-up surgery. But laparoscopic techniques seem to have solved this problem. In laparoscopy, the surgeon makes one or more small incisions. Slender surgical instruments are passed them. This technique eliminates the need   for a large incision. And it creates less tissue damage. Patients who are super obese (greater than 350 pounds) or have had previous abdominal surgery, may not be good candidates for laparoscopy. Less common complications include breakdown of the staple line and stretched stomach outlets.   Some obese patients who have weight-loss surgery develop gallstones. These are clumps of cholesterol and other matter that form in the gallbladder. During quick or substantial weight loss, one's risk of developing gallstones increases. Taking supplemental bile salts for the first 6 months after surgery can prevent them.   Nearly 30 percent of patients who have weight-loss surgery develop nutritional deficiencies. These include anemia, osteoporosis, and metabolic bone disease. These usually can be avoided if vitamin and mineral intakes are high enough.    Women of childbearing age should avoid pregnancy until their weight becomes stable. Quick weight loss and nutritional deficiencies can harm a growing fetus.   Other risks of restrictive surgeries include:   Band slippage.   Stomach prolapse.   Band erosion into the lumen of the stomach.   Port infection.   The main risk with malabsorption operations is life threatening. It is the risk of leak from any of the anastomosis. The more involved the operation, the more risk involved.   There is one other risk of having the surgery. If people do not follow a strict diet, they will stretch out their stomach pouches. Then they will not lose weight.  MEDICAL COSTS  Gastrointestinal surgery costs vary. They depend on the procedure. Medical insurance coverage varies by state and insurance provider. If you are considering gastrointestinal surgery, contact your r egional Medicare or Medicaid office or your insurance plan. Find out from them if the procedure is covered.  IS THE SURGERY FOR YOU?   Gastrointestinal surgery may be the next step for people who remain severely obese after trying nonsurgical approaches or have an obesity-related disease. Candidates for surgery have:   A BMI of 40 or more.   A BMI of 35 or more and a life-threatening obesity-related health problem such as:   Diabetes.   Severe sleep apnea.   Heart disease.   Obesity-related physical problems that interfere with:   Employment.   Walking.   Family function.  If you fit the profile for surgery, answers to these questions may help you decide whether weight-loss surgery is appropriate for you. Are you:   Unlikely to lose weight successfully without surgery?   Well informed about the surgical procedure? The effects of treatment?   Determined to lose weight? Improve your health?    Aware of how your life may change after the operation? Adjustment to the side effects of the surgery include the need to chew well and being unable to eat large meals.   Aware of the potential for serious complications? Dietary restrictions? Occasional failures?   Committed to lifelong medical follow-up?   Restrictive operations are very successful with patients who follow a diet created by a dietician. Support groups and follow up with caregivers is important.  Remember: There are no guarantees for any method to produce and maintain weight loss. This includes surgery. Success is possible only with:   Maximum cooperation.   Commitment to behavioral change.   Medical follow-up.  This cooperation and commitment must be carried out for the rest of your life.    ADDITIONAL RESOURCES  [employer] for Met[employer] Bariatric [employer]  [address], [address]  [place] [address]  www.asmbs.org      Weight-control Information Network (WIN)  [address] [place] [address]  www.niddk.nih.gov/health/nutrit/nutrit.htm  Document Released: 02/08/2005 Document Revised: 05/03/2011 Document Reviewed: 05/04/2006  [employer] Patient Information 2013 [employer].

## 2012-02-15 LAB — TSH: TSH: 0.684 u[IU]/mL (ref 0.350–4.500)

## 2012-03-03 ENCOUNTER — Ambulatory Visit: Payer: PRIVATE HEALTH INSURANCE | Admitting: Gynecology

## 2012-03-03 ENCOUNTER — Other Ambulatory Visit: Payer: PRIVATE HEALTH INSURANCE

## 2012-03-21 ENCOUNTER — Ambulatory Visit: Payer: Self-pay | Admitting: Pain Medicine

## 2012-04-12 ENCOUNTER — Ambulatory Visit: Payer: Self-pay | Admitting: Pain Medicine

## 2012-04-22 HISTORY — PX: LAPAROSCOPIC GASTRIC BANDING: SHX1100

## 2012-04-22 HISTORY — PX: OTHER SURGICAL HISTORY: SHX169

## 2012-04-24 ENCOUNTER — Ambulatory Visit: Payer: Self-pay | Admitting: Surgery

## 2012-05-03 ENCOUNTER — Telehealth: Payer: Self-pay

## 2012-05-03 ENCOUNTER — Ambulatory Visit: Payer: Self-pay | Admitting: Pain Medicine

## 2012-05-03 NOTE — Telephone Encounter (Signed)
Labs reviewed and I am not concerned about them.  TSH and FT4 were normal.  We can just keep an eye on them.

## 2012-05-03 NOTE — Telephone Encounter (Signed)
Pt to have lap band surgery 05/10/12 and pt had labs done at [facility]; T3 uptake was 22. Pt wants to know is that OK.Pt is faxing copy of labs;copy of labs in Dr Elmer Sow in box.Please advise. [facility] [employer].

## 2012-05-03 NOTE — Telephone Encounter (Signed)
Advised patient

## 2012-06-09 ENCOUNTER — Ambulatory Visit: Payer: Self-pay | Admitting: Pain Medicine

## 2012-06-15 ENCOUNTER — Other Ambulatory Visit: Payer: Self-pay | Admitting: Family Medicine

## 2012-07-14 ENCOUNTER — Other Ambulatory Visit: Payer: Self-pay | Admitting: Gynecology

## 2012-07-14 ENCOUNTER — Encounter: Payer: Self-pay | Admitting: Gynecology

## 2012-07-14 ENCOUNTER — Ambulatory Visit (INDEPENDENT_AMBULATORY_CARE_PROVIDER_SITE_OTHER): Payer: PRIVATE HEALTH INSURANCE | Admitting: Gynecology

## 2012-07-14 ENCOUNTER — Ambulatory Visit (INDEPENDENT_AMBULATORY_CARE_PROVIDER_SITE_OTHER): Payer: PRIVATE HEALTH INSURANCE

## 2012-07-14 VITALS — BP 124/80

## 2012-07-14 DIAGNOSIS — N946 Dysmenorrhea, unspecified: Secondary | ICD-10-CM

## 2012-07-14 DIAGNOSIS — N938 Other specified abnormal uterine and vaginal bleeding: Secondary | ICD-10-CM

## 2012-07-14 DIAGNOSIS — N949 Unspecified condition associated with female genital organs and menstrual cycle: Secondary | ICD-10-CM

## 2012-07-14 LAB — TSH: TSH: 0.425 u[IU]/mL (ref 0.350–4.500)

## 2012-07-14 LAB — CBC WITH DIFFERENTIAL/PLATELET
Basophils Absolute: 0 10*3/uL (ref 0.0–0.1)
HCT: 41.1 % (ref 36.0–46.0)
Hemoglobin: 14.1 g/dL (ref 12.0–15.0)
Lymphocytes Relative: 16 % (ref 12–46)
Lymphs Abs: 1.5 10*3/uL (ref 0.7–4.0)
Monocytes Absolute: 0.5 10*3/uL (ref 0.1–1.0)
Monocytes Relative: 5 % (ref 3–12)
Neutro Abs: 7.4 10*3/uL (ref 1.7–7.7)
RBC: 4.71 MIL/uL (ref 3.87–5.11)
WBC: 9.6 10*3/uL (ref 4.0–10.5)

## 2012-07-14 NOTE — Progress Notes (Signed)
47 year old gravida 2 para 1 AB 1 seen in the office for her annual exam 02/14/2012. Presented to the office today because of dysfunctional uterine bleeding while on hormone contraceptive pill. Patient had gastric banding  on March 19. One week afterwards had her normal menstrual cycle. She stated that she had crushed at least 2 of the oral contraceptive pills right before her surgery and still took them  on a regular days without any interruption. She spotted for 2 days and her periods restarted so she's had some breakthrough bleeding since her gastric banding. She is a type II diabetic. Since her surgery she lost 40 pounds. The patient states that she has maintained good compliance on oral contraceptive pill. She denied any unusual headache, double vision or nipple discharge. She is a type II diabetic on metformin.  Exam: Abdomen is: Soft nontender no rebound or guarding Pelvic: Bartholin urethra Skene was within normal limits Vagina: No lesions or discharge Cervix: No lesions or discharge Uterus: Anteverted , The limited because of abdominal girth Adnexa: Limited to abdominal girth Rectal exam: Not done  Sonohysterogram done today as a result of breakthrough bleeding on oral contraceptive pill results as follows:  Uterus measures 8.0 x 4.6 x 3.6 cm with endometrial stripe of 5 mm. Normal left ovary. A right corpus luteum cyst measuring 1.9 x 1.8 x 1.9 cm along with a small solid avascular area measuring 9 x 8 mm. There was no fluid in the cul-de-sac. The cervix was cleansed with Betadine solution and a sterile catheter was introduced into the uterine cavity and normal saline was instilled in an effort to perform the sonohysterogram. There was no intracavitary defect.    Assessment /plan: Breakthrough bleeding while on oral contraceptive pills. Patient has lost 40 pounds since her gastric banding in March of this year. TSH, prolactin, and CBC was obtained today. Unremarkable ultrasound today. It  appears that her isolated episode of breakthrough bleeding may be attributed to her weight gain as a result of her gastric banding in diet and exercise. We will doctors any abnormality on the above-mentioned test results. Otherwise will see her back in one year or when necessary.

## 2012-07-14 NOTE — Patient Instructions (Addendum)

## 2012-08-07 ENCOUNTER — Encounter: Payer: Self-pay | Admitting: Gynecology

## 2012-09-12 ENCOUNTER — Other Ambulatory Visit: Payer: Self-pay | Admitting: *Deleted

## 2012-09-12 MED ORDER — METFORMIN HCL 500 MG PO TABS: 500.0000 mg | ORAL_TABLET | Freq: Two times a day (BID) | ORAL | Status: DC

## 2012-09-24 ENCOUNTER — Other Ambulatory Visit: Payer: Self-pay | Admitting: Family Medicine

## 2012-09-29 ENCOUNTER — Ambulatory Visit: Payer: Self-pay | Admitting: Pain Medicine

## 2012-12-12 ENCOUNTER — Other Ambulatory Visit: Payer: Self-pay | Admitting: *Deleted

## 2012-12-12 MED ORDER — SPIRONOLACTONE 25 MG PO TABS: ORAL_TABLET | ORAL | Status: DC

## 2012-12-22 ENCOUNTER — Ambulatory Visit: Payer: Self-pay | Admitting: Pain Medicine

## 2013-01-12 ENCOUNTER — Ambulatory Visit (INDEPENDENT_AMBULATORY_CARE_PROVIDER_SITE_OTHER): Payer: PRIVATE HEALTH INSURANCE | Admitting: Internal Medicine

## 2013-01-12 ENCOUNTER — Encounter: Payer: Self-pay | Admitting: Internal Medicine

## 2013-01-12 VITALS — BP 120/80 | HR 91 | Temp 97.7°F | Wt 230.0 lb

## 2013-01-12 DIAGNOSIS — I10 Essential (primary) hypertension: Secondary | ICD-10-CM

## 2013-01-12 DIAGNOSIS — J309 Allergic rhinitis, unspecified: Secondary | ICD-10-CM

## 2013-01-12 DIAGNOSIS — E119 Type 2 diabetes mellitus without complications: Secondary | ICD-10-CM

## 2013-01-12 MED ORDER — MONTELUKAST SODIUM 10 MG PO TABS: 10.0000 mg | ORAL_TABLET | Freq: Every day | ORAL | Status: DC

## 2013-01-12 MED ORDER — CETIRIZINE HCL 10 MG PO TABS: 10.0000 mg | ORAL_TABLET | Freq: Every day | ORAL | Status: DC

## 2013-01-12 MED ORDER — SPIRONOLACTONE 25 MG PO TABS: 25.0000 mg | ORAL_TABLET | Freq: Every day | ORAL | Status: DC

## 2013-01-12 MED ORDER — METFORMIN HCL 500 MG PO TABS: 500.0000 mg | ORAL_TABLET | Freq: Every day | ORAL | Status: DC

## 2013-01-12 MED ORDER — FLUTICASONE PROPIONATE 50 MCG/ACT NA SUSP: 2.0000 | Freq: Every day | NASAL | Status: DC

## 2013-01-12 NOTE — Progress Notes (Signed)
Pre-visit discussion using our clinic review tool. No additional management support is needed unless otherwise documented below in the visit note.  

## 2013-01-12 NOTE — Assessment & Plan Note (Signed)
Last A1C 6.1 Metformin refilled today Foot exam today

## 2013-01-12 NOTE — Progress Notes (Signed)
Subjective:    Patient ID: Debra Myers, female    DOB: 1965/03/10, 47 y.o.   MRN: [id number]  HPI  Pt presents to the clinic today for a medication refill. She has no concerns today. She did have labs in July and she will fax them over.  Review of Systems      Past Medical History  Diagnosis Date  . Depression   . Vaginal delivery     ONE NSVD  . Elective abortion     ONE  . Asthma     ALLERGY INDUCED  . Diabetes mellitus     TYPE 2  . Anxiety   . Hypertension   . Degenerative disc disease     Current Outpatient Prescriptions  Medication Sig Dispense Refill  . buPROPion (WELLBUTRIN SR) 150 MG 12 hr tablet Take 150 mg by mouth 2 (two) times daily.      . cetirizine (ZYRTEC) 10 MG tablet Take 1 tablet (10 mg total) by mouth daily.  30 tablet  2  . desogestrel-ethinyl estradiol (VIORELE) 0.15-0.02/0.01 MG (21/5) tablet Take 1 tablet by mouth daily.  28 tablet  11  . fluticasone (FLONASE) 50 MCG/ACT nasal spray Place 2 sprays into both nostrils daily.  16 g  0  . Hydrocodone-Acetaminophen (VICODIN) 5-300 MG TABS Take one by mouth twice a day.  30 each  0  . LamoTRIgine (LAMICTAL PO) Take 250 mg by mouth.       Marland Kitchen LORazepam (ATIVAN) 0.5 MG tablet Take 0.5 mg by mouth every 8 (eight) hours.       . metaxalone (SKELAXIN) 800 MG tablet Take 800 mg by mouth daily.       . metFORMIN (GLUCOPHAGE) 500 MG tablet Take 1 tablet (500 mg total) by mouth daily with breakfast.  30 tablet  2  . montelukast (SINGULAIR) 10 MG tablet Take 1 tablet (10 mg total) by mouth at bedtime.  30 tablet  2  . naproxen sodium (ANAPROX) 220 MG tablet Take 220 mg by mouth 2 (two) times daily with a meal.        . PARoxetine (PAXIL-CR) 37.5 MG 24 hr tablet Take 37.5 mg by mouth 2 (two) times daily.      Marland Kitchen spironolactone (ALDACTONE) 25 MG tablet Take 1 tablet (25 mg total) by mouth daily.  30 tablet  2   No current facility-administered medications for this visit.    Allergies  Allergen Reactions  .  Penicillins     REACTION: GI UPSET  . Sulfonamide Derivatives     REACTION: rash    Family History  Problem Relation Age of Onset  . Hypertension Father   . Diabetes Father   . Asthma Maternal Grandmother     History   Social History  . Marital Status: Married    Spouse Name: N/A    Number of Children: N/A  . Years of Education: N/A   Occupational History  . Not on file.   Social History Main Topics  . Smoking status: Never Smoker   . Smokeless tobacco: Never Used  . Alcohol Use: Yes     Comment: rarely  . Drug Use: No  . Sexual Activity: Yes    Birth Control/ Protection: Pill   Other Topics Concern  . Not on file   Social History Narrative  . No narrative on file     Constitutional: Denies fever, malaise, fatigue, headache or abrupt weight changes.  Respiratory: Denies difficulty breathing, shortness of breath, cough  or sputum production.   Cardiovascular: Denies chest pain, chest tightness, palpitations or swelling in the hands or feet.  Neurological: Denies dizziness, difficulty with memory, difficulty with speech or problems with balance and coordination.   No other specific complaints in a complete review of systems (except as listed in HPI above).  Objective:   Physical Exam  BP 120/80  Pulse 91  Temp(Src) 97.7 F (36.5 C) (Tympanic)  Wt 230 lb (104.327 kg)  SpO2 98% Wt Readings from Last 3 Encounters:  01/12/13 230 lb (104.327 kg)  02/14/12 266 lb (120.657 kg)  02/11/12 265 lb (120.203 kg)    General: Appears her stated age, obese but well developed, well nourished in NAD. Cardiovascular: Normal rate and rhythm. S1,S2 noted.  No murmur, rubs or gallops noted. No JVD or BLE edema. No carotid bruits noted. Pulmonary/Chest: Normal effort and positive vesicular breath sounds. No respiratory distress. No wheezes, rales or ronchi noted.  Neurological: Alert and oriented. Cranial nerves II-XII intact. Coordination normal. +DTRs  bilaterally.   BMET    Component Value Date/Time   NA 141 12/25/2010 1103   K 4.2 12/25/2010 1103   CL 106 12/25/2010 1103   CO2 24 12/25/2010 1103   GLUCOSE 81 12/25/2010 1103   BUN 10 12/25/2010 1103   CREATININE 0.76 12/25/2010 1103   CREATININE 0.8 07/14/2007 1223   CALCIUM 8.6 12/25/2010 1103   GFRNONAA 84 07/14/2007 1223   GFRAA 102 07/14/2007 1223    Lipid Panel     Component Value Date/Time   CHOL 174 07/09/2011 1548   TRIG 246* 07/09/2011 1548   HDL 55 07/09/2011 1548   CHOLHDL 3.2 07/09/2011 1548   VLDL 49* 07/09/2011 1548   LDLCALC 70 07/09/2011 1548    CBC    Component Value Date/Time   WBC 9.6 07/14/2012 1055   RBC 4.71 07/14/2012 1055   HGB 14.1 07/14/2012 1055   HCT 41.1 07/14/2012 1055   PLT 253 07/14/2012 1055   MCV 87.3 07/14/2012 1055   MCH 29.9 07/14/2012 1055   MCHC 34.3 07/14/2012 1055   RDW 13.5 07/14/2012 1055   LYMPHSABS 1.5 07/14/2012 1055   MONOABS 0.5 07/14/2012 1055   EOSABS 0.2 07/14/2012 1055   BASOSABS 0.0 07/14/2012 1055    Hgb A1C Lab Results  Component Value Date   HGBA1C 6.1* 02/14/2012         Assessment & Plan:

## 2013-01-12 NOTE — Assessment & Plan Note (Signed)
Well controlled  Aldactone refilled today

## 2013-01-12 NOTE — Assessment & Plan Note (Signed)
No issues Refilled singulair, zyrtec and flonase

## 2013-02-09 ENCOUNTER — Other Ambulatory Visit: Payer: Self-pay | Admitting: *Deleted

## 2013-02-09 DIAGNOSIS — J309 Allergic rhinitis, unspecified: Secondary | ICD-10-CM

## 2013-02-09 MED ORDER — FLUTICASONE PROPIONATE 50 MCG/ACT NA SUSP: 2.0000 | Freq: Every day | NASAL | Status: DC

## 2013-02-20 ENCOUNTER — Other Ambulatory Visit: Payer: Self-pay | Admitting: Gynecology

## 2013-03-02 ENCOUNTER — Telehealth: Payer: Self-pay | Admitting: *Deleted

## 2013-03-02 DIAGNOSIS — Z01419 Encounter for gynecological examination (general) (routine) without abnormal findings: Secondary | ICD-10-CM

## 2013-03-02 NOTE — Telephone Encounter (Signed)
LEFT THE BELOW ON VOICEMAIL, ORDERS PLACED.

## 2013-03-02 NOTE — Telephone Encounter (Signed)
Dr. Dayton MartesAron had been doing her blood work since he is monitoring her hypertension and diabetes. She can have her TSH prolactin CBC done here the for office visit.

## 2013-03-02 NOTE — Telephone Encounter (Signed)
Pt has annual scheduled on 03/23/13 would like to have labs done prior to annual. Please advise

## 2013-03-23 ENCOUNTER — Encounter: Payer: PRIVATE HEALTH INSURANCE | Admitting: Gynecology

## 2013-03-28 ENCOUNTER — Other Ambulatory Visit: Payer: Self-pay | Admitting: Gynecology

## 2013-04-13 ENCOUNTER — Ambulatory Visit: Payer: Self-pay | Admitting: Pain Medicine

## 2013-04-13 ENCOUNTER — Other Ambulatory Visit: Payer: Self-pay | Admitting: Internal Medicine

## 2013-04-20 ENCOUNTER — Encounter: Payer: PRIVATE HEALTH INSURANCE | Admitting: Gynecology

## 2013-04-26 ENCOUNTER — Other Ambulatory Visit: Payer: Self-pay | Admitting: Gynecology

## 2013-05-14 ENCOUNTER — Other Ambulatory Visit: Payer: Self-pay | Admitting: Internal Medicine

## 2013-05-17 ENCOUNTER — Other Ambulatory Visit: Payer: Self-pay | Admitting: *Deleted

## 2013-05-17 MED ORDER — SPIRONOLACTONE 25 MG PO TABS: 25.0000 mg | ORAL_TABLET | Freq: Once | ORAL | Status: DC

## 2013-05-18 ENCOUNTER — Other Ambulatory Visit (HOSPITAL_COMMUNITY)
Admission: RE | Admit: 2013-05-18 | Discharge: 2013-05-18 | Disposition: A | Discharge status: Home / Self Care | Payer: PRIVATE HEALTH INSURANCE | Source: Ambulatory Visit | Attending: Gynecology | Admitting: Gynecology

## 2013-05-18 ENCOUNTER — Ambulatory Visit (INDEPENDENT_AMBULATORY_CARE_PROVIDER_SITE_OTHER): Payer: PRIVATE HEALTH INSURANCE | Admitting: Gynecology

## 2013-05-18 ENCOUNTER — Encounter: Payer: Self-pay | Admitting: Gynecology

## 2013-05-18 VITALS — BP 140/86 | Ht 66.0 in | Wt 227.0 lb

## 2013-05-18 DIAGNOSIS — Z01419 Encounter for gynecological examination (general) (routine) without abnormal findings: Secondary | ICD-10-CM

## 2013-05-18 DIAGNOSIS — Z1151 Encounter for screening for human papillomavirus (HPV): Secondary | ICD-10-CM | POA: Insufficient documentation

## 2013-05-18 DIAGNOSIS — Z8742 Personal history of other diseases of the female genital tract: Secondary | ICD-10-CM

## 2013-05-18 DIAGNOSIS — N949 Unspecified condition associated with female genital organs and menstrual cycle: Secondary | ICD-10-CM

## 2013-05-18 DIAGNOSIS — Z9884 Bariatric surgery status: Secondary | ICD-10-CM

## 2013-05-18 DIAGNOSIS — N938 Other specified abnormal uterine and vaginal bleeding: Secondary | ICD-10-CM

## 2013-05-18 DIAGNOSIS — R102 Pelvic and perineal pain: Secondary | ICD-10-CM

## 2013-05-18 DIAGNOSIS — Z113 Encounter for screening for infections with a predominantly sexual mode of transmission: Secondary | ICD-10-CM

## 2013-05-18 LAB — URINALYSIS W MICROSCOPIC + REFLEX CULTURE
BILIRUBIN URINE: NEGATIVE
Bacteria, UA: NONE SEEN
CASTS: NONE SEEN
GLUCOSE, UA: NEGATIVE mg/dL
Hgb urine dipstick: NEGATIVE
KETONES UR: NEGATIVE mg/dL
Leukocytes, UA: NEGATIVE
Nitrite: NEGATIVE
PH: 6 (ref 5.0–8.0)
Protein, ur: NEGATIVE mg/dL
RBC / HPF: NONE SEEN RBC/hpf (ref <3)
SPECIFIC GRAVITY, URINE: 1.027 (ref 1.005–1.030)
UROBILINOGEN UA: 0.2 mg/dL (ref 0.0–1.0)
WBC, UA: NONE SEEN WBC/hpf (ref <3)

## 2013-05-18 LAB — CBC WITH DIFFERENTIAL/PLATELET
BASOS ABS: 0 10*3/uL (ref 0.0–0.1)
Basophils Relative: 0 % (ref 0–1)
Eosinophils Absolute: 0.1 10*3/uL (ref 0.0–0.7)
Eosinophils Relative: 1 % (ref 0–5)
HEMATOCRIT: 37.4 % (ref 36.0–46.0)
HEMOGLOBIN: 13.2 g/dL (ref 12.0–15.0)
LYMPHS PCT: 17 % (ref 12–46)
Lymphs Abs: 1.6 10*3/uL (ref 0.7–4.0)
MCH: 29.8 pg (ref 26.0–34.0)
MCHC: 35.3 g/dL (ref 30.0–36.0)
MCV: 84.4 fL (ref 78.0–100.0)
MONO ABS: 0.3 10*3/uL (ref 0.1–1.0)
MONOS PCT: 3 % (ref 3–12)
NEUTROS ABS: 7.5 10*3/uL (ref 1.7–7.7)
Neutrophils Relative %: 79 % — ABNORMAL HIGH (ref 43–77)
Platelets: 291 10*3/uL (ref 150–400)
RBC: 4.43 MIL/uL (ref 3.87–5.11)
RDW: 13.5 % (ref 11.5–15.5)
WBC: 9.5 10*3/uL (ref 4.0–10.5)

## 2013-05-18 LAB — PREGNANCY, URINE: Preg Test, Ur: NEGATIVE

## 2013-05-18 NOTE — Progress Notes (Signed)
Debra ClockRobin D Myers [birth year] [id number]   History:    48 y.o.  for annual gyn exam who has not been seen for annual exam since 2013. Patient has complained of irregular bleeding for the past month and a half despite good compliance on oral contraceptive pill the same as she did a year ago. At that time last year she had an ultrasound and sonohysterogram with the following findings:  Uterus measures 8.0 x 4.6 x 3.6 cm with endometrial stripe of 5 mm. Normal left ovary. A right corpus luteum cyst measuring 1.9 x 1.8 x 1.9 cm along with a small solid avascular area measuring 9 x 8 mm. There was no fluid in the cul-de-sac. The cervix was cleansed with Betadine solution and a sterile catheter was introduced into the uterine cavity and normal saline was instilled in an effort to perform the sonohysterogram. There was no intracavitary defect.  The patient had gastric banding in March of 2014 it had lost 40 pounds so the irregular bleeding was attributed to that.  The patient also wanted to have an HIV and hepatitis C. test drawn since she was given injections to a friend of her (patient is a Engineer, civil (consulting)nurse) and had a needlestick last year and states that she had concerned about this individual who she assumes is gay and recently died.  Urine pregnancy test was negative today.  Past medical history,surgical history, family history and social history were all reviewed and documented in the EPIC chart.  Gynecologic History Patient's last menstrual period was 05/14/2013. Contraception: OCP (estrogen/progesterone) Last Pap: 2012. Results were: normal Last mammogram: 2014. Results were: normal  Obstetric History OB History  Gravida Para Term Preterm AB SAB TAB Ectopic Multiple Living  2 1 1  1 1    1     # Outcome Date GA Lbr Len/2nd Weight Sex Delivery Anes PTL Lv  2 SAB           1 TRM     F SVD   Y       ROS: A ROS was performed and pertinent positives and negatives are included in the history.  GENERAL: No fevers or chills. HEENT: No change in vision, no earache, sore throat or sinus congestion. NECK: No pain or stiffness. CARDIOVASCULAR: No chest pain or pressure. No palpitations. PULMONARY: No shortness of breath, cough or wheeze. GASTROINTESTINAL: No abdominal pain, nausea, vomiting or diarrhea, melena or bright red blood per rectum. GENITOURINARY: No urinary frequency, urgency, hesitancy or dysuria. MUSCULOSKELETAL: No joint or muscle pain, no back pain, no recent trauma. DERMATOLOGIC: No rash, no itching, no lesions. ENDOCRINE: No polyuria, polydipsia, no heat or cold intolerance. No recent change in weight. HEMATOLOGICAL: No anemia or easy bruising or bleeding. NEUROLOGIC: No headache, seizures, numbness, tingling or weakness. PSYCHIATRIC: No depression, no loss of interest in normal activity or change in sleep pattern.     Exam: chaperone present  BP 140/86  Ht 5\' 6"  (1.676 m)  Wt 227 lb (102.967 kg)  BMI 36.66 kg/m2  LMP 05/14/2013  Body mass index is 36.66 kg/(m^2).  General appearance : Well developed well nourished female. No acute distress HEENT: Neck supple, trachea midline, no carotid bruits, no thyroidmegaly Lungs: Clear to auscultation, no rhonchi or wheezes, or rib retractions  Heart: Regular rate and rhythm, no murmurs or gallops Breast:Examined in sitting and supine position were symmetrical in appearance, no palpable masses or tenderness,  no skin retraction, no nipple inversion, no nipple discharge, no skin discoloration,  no axillary or supraclavicular lymphadenopathy Abdomen: no palpable masses or tenderness, no rebound or guarding Extremities: no edema or skin discoloration or tenderness  Pelvic:  Bartholin, Urethra, Skene Glands: Within normal limits             Vagina: No gross lesions or discharge, dark brown blood present  Cervix: No gross lesions or discharge  Uterus  anteverted, normal size, shape and consistency, non-tender and mobile  Adnexa   Without masses or tenderness  Anus and perineum  normal   Rectovaginal  normal sphincter tone without palpated masses or tenderness             Hemoccult not indicated   Patient was counseled for endometrial biopsy after Pap smear was obtained. The cervix was cleansed with Betadine solution and a sterile Pipelle was introduced into the uterine cavity and tissue was obtained for histological evaluation.  Assessment/Plan:  48 y.o. female for annual exam with two-month history of breakthrough bleeding on 20 mcg oral contraceptive pill. Patient with history of gastric banding in 2014. She had been weighing 266 now weighing 227 pounds. An endometrial biopsy was done to rule out any pathology due to the fact that she is 48 years of age. She will return back to the office next week for a sonohysterogram in better assessment of her ovaries since she is slightly overweight and to compare with previous study 11 months ago. Because of her needlestick last year and HIV and hepatitis C was obtained today. The other labs obtained today were vitamin D, comprehensive metabolic panel, TSH, urinalysis, fasting lipid profile as well as her Pap smear. She is due for a mammogram in June of this year.  Note: This dictation was prepared with  Dragon/digital dictation along withSmart phrase technology. Any transcriptional errors that result from this process are unintentional.   Ok Edwards MD, 9:18 AM 05/18/2013

## 2013-05-19 LAB — COMPREHENSIVE METABOLIC PANEL
ALBUMIN: 3.6 g/dL (ref 3.5–5.2)
ALT: 10 U/L (ref 0–35)
AST: 10 U/L (ref 0–37)
Alkaline Phosphatase: 59 U/L (ref 39–117)
BUN: 14 mg/dL (ref 6–23)
CHLORIDE: 108 meq/L (ref 96–112)
CO2: 23 meq/L (ref 19–32)
Calcium: 8.3 mg/dL — ABNORMAL LOW (ref 8.4–10.5)
Creat: 0.74 mg/dL (ref 0.50–1.10)
GLUCOSE: 100 mg/dL — AB (ref 70–99)
POTASSIUM: 4.3 meq/L (ref 3.5–5.3)
Sodium: 137 mEq/L (ref 135–145)
Total Bilirubin: 0.3 mg/dL (ref 0.2–1.2)
Total Protein: 6.3 g/dL (ref 6.0–8.3)

## 2013-05-19 LAB — HEPATITIS C ANTIBODY: HCV AB: NEGATIVE

## 2013-05-19 LAB — LIPID PANEL
CHOL/HDL RATIO: 2.7 ratio
CHOLESTEROL: 146 mg/dL (ref 0–200)
HDL: 55 mg/dL (ref >39)
LDL Cholesterol: 70 mg/dL (ref 0–99)
TRIGLYCERIDES: 106 mg/dL (ref <150)
VLDL: 21 mg/dL (ref 0–40)

## 2013-05-19 LAB — HIV ANTIBODY (ROUTINE TESTING W REFLEX): HIV: NONREACTIVE

## 2013-05-19 LAB — VITAMIN D 25 HYDROXY (VIT D DEFICIENCY, FRACTURES): Vit D, 25-Hydroxy: 44 ng/mL (ref 30–89)

## 2013-05-19 LAB — TSH: TSH: 0.544 u[IU]/mL (ref 0.350–4.500)

## 2013-05-25 ENCOUNTER — Other Ambulatory Visit: Payer: Self-pay | Admitting: Gynecology

## 2013-05-31 ENCOUNTER — Encounter: Payer: PRIVATE HEALTH INSURANCE | Admitting: Family Medicine

## 2013-06-01 ENCOUNTER — Other Ambulatory Visit: Payer: Self-pay

## 2013-06-01 DIAGNOSIS — J309 Allergic rhinitis, unspecified: Secondary | ICD-10-CM

## 2013-06-01 MED ORDER — MONTELUKAST SODIUM 10 MG PO TABS: 10.0000 mg | ORAL_TABLET | Freq: Every day | ORAL | Status: DC

## 2013-06-06 ENCOUNTER — Ambulatory Visit: Payer: Self-pay | Admitting: Pain Medicine

## 2013-06-08 ENCOUNTER — Ambulatory Visit (INDEPENDENT_AMBULATORY_CARE_PROVIDER_SITE_OTHER): Payer: PRIVATE HEALTH INSURANCE | Admitting: Family Medicine

## 2013-06-08 ENCOUNTER — Encounter: Payer: Self-pay | Admitting: Family Medicine

## 2013-06-08 VITALS — BP 130/78 | Temp 98.0°F | Wt 231.5 lb

## 2013-06-08 DIAGNOSIS — I10 Essential (primary) hypertension: Secondary | ICD-10-CM

## 2013-06-08 DIAGNOSIS — N938 Other specified abnormal uterine and vaginal bleeding: Secondary | ICD-10-CM

## 2013-06-08 DIAGNOSIS — F411 Generalized anxiety disorder: Secondary | ICD-10-CM

## 2013-06-08 DIAGNOSIS — J45909 Unspecified asthma, uncomplicated: Secondary | ICD-10-CM

## 2013-06-08 DIAGNOSIS — N925 Other specified irregular menstruation: Secondary | ICD-10-CM

## 2013-06-08 DIAGNOSIS — J309 Allergic rhinitis, unspecified: Secondary | ICD-10-CM

## 2013-06-08 DIAGNOSIS — N949 Unspecified condition associated with female genital organs and menstrual cycle: Secondary | ICD-10-CM

## 2013-06-08 MED ORDER — SPIRONOLACTONE 25 MG PO TABS: 25.0000 mg | ORAL_TABLET | Freq: Once | ORAL | Status: DC

## 2013-06-08 MED ORDER — FLUTICASONE PROPIONATE 50 MCG/ACT NA SUSP: 2.0000 | Freq: Every day | NASAL | Status: DC

## 2013-06-08 MED ORDER — MONTELUKAST SODIUM 10 MG PO TABS: 10.0000 mg | ORAL_TABLET | Freq: Every day | ORAL | Status: DC

## 2013-06-08 MED ORDER — METFORMIN HCL 500 MG PO TABS: ORAL_TABLET | ORAL | Status: DC

## 2013-06-08 MED ORDER — CETIRIZINE HCL 10 MG PO TABS: 10.0000 mg | ORAL_TABLET | Freq: Every day | ORAL | Status: DC

## 2013-06-08 NOTE — Assessment & Plan Note (Signed)
>  25 minutes spent in face to face time with patient, >50% spent in counselling or coordination of care Feels symptoms controlled on current rx and grieving appropriately.

## 2013-06-08 NOTE — Assessment & Plan Note (Signed)
Seeing GYN
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
## 2013-06-08 NOTE — Progress Notes (Signed)
Pre visit review using our clinic review tool, if applicable. No additional management support is needed unless otherwise documented below in the visit note. 

## 2013-06-08 NOTE — Assessment & Plan Note (Signed)
Well controlled. No changes. 

## 2013-06-19 ENCOUNTER — Ambulatory Visit: Payer: Self-pay | Admitting: Pain Medicine

## 2013-07-06 ENCOUNTER — Ambulatory Visit: Payer: Self-pay | Admitting: Pain Medicine

## 2013-07-11 ENCOUNTER — Telehealth: Payer: Self-pay | Admitting: Gynecology

## 2013-07-11 NOTE — Telephone Encounter (Signed)
07/11/13-RECEIVED AUTHORIZATION FROM [employer] FOR PT SONOHYSTOGRAM- [id number]

## 2013-07-24 ENCOUNTER — Other Ambulatory Visit: Payer: Self-pay | Admitting: Gynecology

## 2013-07-24 DIAGNOSIS — N938 Other specified abnormal uterine and vaginal bleeding: Secondary | ICD-10-CM

## 2013-07-24 DIAGNOSIS — Z9884 Bariatric surgery status: Secondary | ICD-10-CM

## 2013-07-24 DIAGNOSIS — R102 Pelvic and perineal pain: Secondary | ICD-10-CM

## 2013-07-24 DIAGNOSIS — Z8742 Personal history of other diseases of the female genital tract: Secondary | ICD-10-CM

## 2013-07-26 ENCOUNTER — Telehealth: Payer: Self-pay | Admitting: *Deleted

## 2013-07-26 NOTE — Telephone Encounter (Signed)
Message copied by Aura Camps on Thu Jul 26, 2013  8:54 AM ------      Message from: Jerilynn Mages      Created: Thu Jul 26, 2013  8:24 AM      Regarding: shgm and bleeding       Patient is schedule for sonohysterogram tomorrow and started her cycle today, she is on a heavy flow.  Can you ask JF if he still wants to do sonohysterogram tomorrow? Her diagnosis last visit was DUB (dysfunctional uterine bleeding) ,  History of PCOS . ------

## 2013-07-26 NOTE — Telephone Encounter (Signed)
See below note.

## 2013-07-26 NOTE — Telephone Encounter (Signed)
Reschedule for next week

## 2013-07-26 NOTE — Telephone Encounter (Signed)
Debra Myers informed patient.

## 2013-07-27 ENCOUNTER — Other Ambulatory Visit: Payer: PRIVATE HEALTH INSURANCE

## 2013-07-27 ENCOUNTER — Ambulatory Visit: Payer: PRIVATE HEALTH INSURANCE | Admitting: Gynecology

## 2013-08-03 ENCOUNTER — Ambulatory Visit (INDEPENDENT_AMBULATORY_CARE_PROVIDER_SITE_OTHER): Payer: PRIVATE HEALTH INSURANCE

## 2013-08-03 ENCOUNTER — Ambulatory Visit (INDEPENDENT_AMBULATORY_CARE_PROVIDER_SITE_OTHER): Payer: PRIVATE HEALTH INSURANCE | Admitting: Gynecology

## 2013-08-03 DIAGNOSIS — N840 Polyp of corpus uteri: Secondary | ICD-10-CM

## 2013-08-03 DIAGNOSIS — Z8742 Personal history of other diseases of the female genital tract: Secondary | ICD-10-CM

## 2013-08-03 DIAGNOSIS — R102 Pelvic and perineal pain: Secondary | ICD-10-CM

## 2013-08-03 DIAGNOSIS — N938 Other specified abnormal uterine and vaginal bleeding: Secondary | ICD-10-CM

## 2013-08-03 DIAGNOSIS — N949 Unspecified condition associated with female genital organs and menstrual cycle: Secondary | ICD-10-CM

## 2013-08-03 DIAGNOSIS — N925 Other specified irregular menstruation: Secondary | ICD-10-CM

## 2013-08-03 DIAGNOSIS — Z9884 Bariatric surgery status: Secondary | ICD-10-CM

## 2013-08-03 NOTE — Progress Notes (Signed)
   Patient presented to the office today for evaluation of her dysfunctional uterine bleeding despite having good compliance oral contraceptive pill. Patient was seen in the office for annual exam on 05/18/2013 and was instructed to followup and she did not until now. Patient had a normal comprehensive metabolic panel, lipid profile, CBC, vitamin D level. She had a negative hepatitis C. and HIV testing because of concern of a possible needle stick at her work. All these tests were done on 05/18/2013. Her Pap smear at the same time was negative.  Ultrasound today: Uterus measures 7.6 x 4.1 x 3.2 cm with endometrial stripe of 3.7 mm. Normal uterus and ovaries. No free fluid seen in the cul-de-sac. The cervix was cleansed with Betadine solution. A sterile catheter was introduced into the uterine cavity and sterile normal saline was instilled into the uterine cavity and there was a questionable endometrial polyp measured 6 x 5 mm.  After this a sterile Pipelle was introduced into the uterine cavity and an endometrial biopsy was obtained and tissue submitted for histological evaluation.  Assessment/plan: Patient with several months of irregular bleeding although last menstrual cycle reported to be normal. Questionable small endometrial polyp. Patient has opted to wait 3 months for followup sonohysterogram. If polypoid lesions still present and or irregular bleeding persists despite good compliance on oral contraceptive pill and if the endometrial biopsy today is benign we will then proceed we'll resect scopic polypectomy.

## 2013-08-16 ENCOUNTER — Other Ambulatory Visit: Payer: Self-pay | Admitting: Family Medicine

## 2013-09-03 ENCOUNTER — Other Ambulatory Visit: Payer: Self-pay

## 2013-09-03 DIAGNOSIS — J302 Other seasonal allergic rhinitis: Secondary | ICD-10-CM

## 2013-09-03 MED ORDER — MONTELUKAST SODIUM 10 MG PO TABS: 10.0000 mg | ORAL_TABLET | Freq: Every day | ORAL | Status: DC

## 2013-09-04 ENCOUNTER — Telehealth: Payer: Self-pay

## 2013-09-04 NOTE — Telephone Encounter (Signed)
[address] [place] [address] p. [phone] f. [phone] To: [facility] (After Hours T[facility]) Fax: [phone] From: [facility] Date/ Time: 09/03/2013 5:30 PM Taken By: Felix AhmadiAmy Beinke, RN Caller: Zella Ballobin Facility: Not Collected Patient: Loralyn FreshwaterClapp, Betsey DOB: [birth year] Phone: [phone] Reason for Call: Caller was unable to be reached on callback - Left Message Regarding Appointment: No Appt Date: Appt Time: Unknown Provider: Reason: Details: Outcome:

## 2013-09-05 ENCOUNTER — Telehealth: Payer: Self-pay | Admitting: Family Medicine

## 2013-09-05 NOTE — Telephone Encounter (Signed)
Confidential Office Message [address] [place] [address] p. [phone] f. [phone] To: [facility] (After Hours Triage) Fax: [phone] From: [facility] Date/ Time: 09/03/2013 5:30 PM Taken By: Felix AhmadiAmy Beinke, RN Caller: Zella Ballobin Facility: Not Collected Patient: Debra Myers, Debra Myers DOB: 1966-01-13 Phone: [phone] Reason for Call: Caller was unable to be reached on callback - Left Message Regarding Appointment: No Appt Date: Appt Time: Unknown Provider: Reason: Details: Outcome: Confidential

## 2013-09-11 ENCOUNTER — Encounter: Payer: Self-pay | Admitting: Gynecology

## 2013-09-18 ENCOUNTER — Other Ambulatory Visit: Payer: Self-pay | Admitting: Family Medicine

## 2013-09-28 ENCOUNTER — Ambulatory Visit: Payer: Self-pay | Admitting: Pain Medicine

## 2013-10-18 ENCOUNTER — Other Ambulatory Visit: Payer: Self-pay | Admitting: Family Medicine

## 2013-10-23 ENCOUNTER — Other Ambulatory Visit: Payer: Self-pay | Admitting: Gynecology

## 2013-10-23 DIAGNOSIS — N840 Polyp of corpus uteri: Secondary | ICD-10-CM

## 2013-10-24 ENCOUNTER — Telehealth: Payer: Self-pay | Admitting: Gynecology

## 2013-10-24 NOTE — Telephone Encounter (Signed)
10/24/13-Per [employer] pt ins she will not need a prior authorization for repeat sonohystogram Scheduled with JF on 11/12/13/wl

## 2013-11-12 ENCOUNTER — Other Ambulatory Visit: Payer: Self-pay | Admitting: Gynecology

## 2013-11-12 ENCOUNTER — Ambulatory Visit: Payer: PRIVATE HEALTH INSURANCE | Admitting: Gynecology

## 2013-11-12 ENCOUNTER — Other Ambulatory Visit: Payer: PRIVATE HEALTH INSURANCE

## 2013-11-12 ENCOUNTER — Ambulatory Visit (INDEPENDENT_AMBULATORY_CARE_PROVIDER_SITE_OTHER): Payer: PRIVATE HEALTH INSURANCE | Admitting: Gynecology

## 2013-11-12 ENCOUNTER — Ambulatory Visit (INDEPENDENT_AMBULATORY_CARE_PROVIDER_SITE_OTHER): Payer: PRIVATE HEALTH INSURANCE

## 2013-11-12 DIAGNOSIS — N938 Other specified abnormal uterine and vaginal bleeding: Secondary | ICD-10-CM

## 2013-11-12 DIAGNOSIS — N83202 Unspecified ovarian cyst, left side: Secondary | ICD-10-CM

## 2013-11-12 DIAGNOSIS — N839 Noninflammatory disorder of ovary, fallopian tube and broad ligament, unspecified: Secondary | ICD-10-CM

## 2013-11-12 DIAGNOSIS — N840 Polyp of corpus uteri: Secondary | ICD-10-CM

## 2013-11-12 DIAGNOSIS — N83209 Unspecified ovarian cyst, unspecified side: Secondary | ICD-10-CM

## 2013-11-12 DIAGNOSIS — N83201 Unspecified ovarian cyst, right side: Secondary | ICD-10-CM | POA: Insufficient documentation

## 2013-11-12 DIAGNOSIS — N949 Unspecified condition associated with female genital organs and menstrual cycle: Secondary | ICD-10-CM

## 2013-11-12 MED ORDER — MEDROXYPROGESTERONE ACETATE 150 MG/ML IM SUSP
150.0000 mg | Freq: Once | INTRAMUSCULAR | Status: AC
  Administered 2013-11-12: 150 mg via INTRAMUSCULAR

## 2013-11-12 NOTE — Progress Notes (Signed)
   The patient presented to the office today for a sonohysterogram as part of her followup for dysfunction uterine bleeding. Patient was seen in the office 08/03/2013. Patient has been on her oral contraceptive pill and has been compliant.  June of this year patient had an ultrasound/sonohysterogram as follows: Ultrasound today: Uterus measures 7.6 x 4.1 x 3.2 cm with endometrial stripe of 3.7 mm. Normal uterus and ovaries. No free fluid seen in the cul-de-sac. The cervix was cleansed with Betadine solution. A sterile catheter was introduced into the uterine cavity and sterile normal saline was instilled into the uterine cavity and there was a questionable endometrial polyp measured 6 x 5 mm  She had an endometrial biopsy on the same day with a final result   Diagnosis Endometrium, biopsy - PROLIFERATIVE-TYPE ENDOMETRIUM WITH BREAKDOWN. - THERE IS NO EVIDENCE OF HYPERPLASIA OR MALIGNANCY. - SEE COMMENT. Microscopic Comment Sections show fragments of benign proliferative-type endometrium in which there are foci of breakdown with fragmented glands and condensed stroma. The features suggest anovulatory or estrogen withdrawal Bleeding.  Ultrasound today: Uterus measures 7.9 x 4.4 x 3.6 cm. A right solid focus was noted measuring 12 x 10 x 12 mm negative color flow. Left ovary remove all remaining tissue thin wall echo free cyst measuring 5.4 x 4.9 x 4.9 cm negative color flow. The cervix was cleansed with Betadine solution and a sterile catheter was introduced into the uterine cavity normal saline was instilled and a small right anterior defect measured 8 x 3 x 6 mm was noted very flimsy almost to the point that it was getting ready to slough off.   Assessment/plan: Left ovarian cyst measuring 5.1 cm thinwall echo free. We will do a CA 125 today. There is limitation discussed with the patient. She has 2 days remaining on her oral contraceptive pill which I have asked her not to continue. She will  receive a shot of Depo-Provera 150 mg IM in an attempt to decrease the size of this cyst and at the same time to see if a small endometrial polyp falls off. She will return back in 3 months for followup ultrasound sono histogram and if still present we will proceed with surgical resection of an endometrial polyp and or ovarian cystectomy as well. Literature information was provided./

## 2013-11-12 NOTE — Addendum Note (Signed)
Addended by: Berna Spare A on: 11/12/2013 12:37 PM   Modules accepted: Orders

## 2013-11-12 NOTE — Patient Instructions (Addendum)
Medroxyprogesterone injection [Contraceptive] What is this medicine? MEDROXYPROGESTERONE (me DROX ee proe JES te rone) contraceptive injections prevent pregnancy. They provide effective birth control for 3 months. Depo-subQ Provera 104 is also used for treating pain related to endometriosis. This medicine may be used for other purposes; ask your health care provider or pharmacist if you have questions. COMMON BRAND NAME(S): Depo-Provera, Depo-subQ Provera 104 What should I tell my health care provider before I take this medicine? They need to know if you have any of these conditions: -frequently drink alcohol -asthma -blood vessel disease or a history of a blood clot in the lungs or legs -bone disease such as osteoporosis -breast cancer -diabetes -eating disorder (anorexia nervosa or bulimia) -high blood pressure -HIV infection or AIDS -kidney disease -liver disease -mental depression -migraine -seizures (convulsions) -stroke -tobacco smoker -vaginal bleeding -an unusual or allergic reaction to medroxyprogesterone, other hormones, medicines, foods, dyes, or preservatives -pregnant or trying to get pregnant -breast-feeding How should I use this medicine? Depo-Provera Contraceptive injection is given into a muscle. Depo-subQ Provera 104 injection is given under the skin. These injections are given by a health care professional. You must not be pregnant before getting an injection. The injection is usually given during the first 5 days after the start of a menstrual period or 6 weeks after delivery of a baby. Talk to your pediatrician regarding the use of this medicine in children. Special care may be needed. These injections have been used in female children who have started having menstrual periods. Overdosage: If you think you have taken too much of this medicine contact a poison control center or emergency room at once. NOTE: This medicine is only for you. Do not share this medicine  with others. What if I miss a dose? Try not to miss a dose. You must get an injection once every 3 months to maintain birth control. If you cannot keep an appointment, call and reschedule it. If you wait longer than 13 weeks between Depo-Provera contraceptive injections or longer than 14 weeks between Depo-subQ Provera 104 injections, you could get pregnant. Use another method for birth control if you miss your appointment. You may also need a pregnancy test before receiving another injection. What may interact with this medicine? Do not take this medicine with any of the following medications: -bosentan This medicine may also interact with the following medications: -aminoglutethimide -antibiotics or medicines for infections, especially rifampin, rifabutin, rifapentine, and griseofulvin -aprepitant -barbiturate medicines such as phenobarbital or primidone -bexarotene -carbamazepine -medicines for seizures like ethotoin, felbamate, oxcarbazepine, phenytoin, topiramate -modafinil -St. John's wort This list may not describe all possible interactions. Give your health care provider a list of all the medicines, herbs, non-prescription drugs, or dietary supplements you use. Also tell them if you smoke, drink alcohol, or use illegal drugs. Some items may interact with your medicine. What should I watch for while using this medicine? This drug does not protect you against HIV infection (AIDS) or other sexually transmitted diseases. Use of this product may cause you to lose calcium from your bones. Loss of calcium may cause weak bones (osteoporosis). Only use this product for more than 2 years if other forms of birth control are not right for you. The longer you use this product for birth control the more likely you will be at risk for weak bones. Ask your health care professional how you can keep strong bones. You may have a change in bleeding pattern or irregular periods. Many females stop having    periods while taking this drug. If you have received your injections on time, your chance of being pregnant is very low. If you think you may be pregnant, see your health care professional as soon as possible. Tell your health care professional if you want to get pregnant within the next year. The effect of this medicine may last a long time after you get your last injection. What side effects may I notice from receiving this medicine? Side effects that you should report to your doctor or health care professional as soon as possible: -allergic reactions like skin rash, itching or hives, swelling of the face, lips, or tongue -breast tenderness or discharge -breathing problems -changes in vision -depression -feeling faint or lightheaded, falls -fever -pain in the abdomen, chest, groin, or leg -problems with balance, talking, walking -unusually weak or tired -yellowing of the eyes or skin Side effects that usually do not require medical attention (report to your doctor or health care professional if they continue or are bothersome): -acne -fluid retention and swelling -headache -irregular periods, spotting, or absent periods -temporary pain, itching, or skin reaction at site where injected -weight gain This list may not describe all possible side effects. Call your doctor for medical advice about side effects. You may report side effects to FDA at 1-800-FDA-1088. Where should I keep my medicine? This does not apply. The injection will be given to you by a health care professional. NOTE: This sheet is a summary. It may not cover all possible information. If you have questions about this medicine, talk to your doctor, pharmacist, or health care provider.  2015, Elsevier/Gold Standard. (2008-03-01 18:37:56) Ovarian Cyst An ovarian cyst is a fluid-filled sac that forms on an ovary. The ovaries are small organs that produce eggs in women. Various types of cysts can form on the ovaries. Most are  not cancerous. Many do not cause problems, and they often go away on their own. Some may cause symptoms and require treatment. Common types of ovarian cysts include:  Functional cysts--These cysts may occur every month during the menstrual cycle. This is normal. The cysts usually go away with the next menstrual cycle if the woman does not get pregnant. Usually, there are no symptoms with a functional cyst.  Endometrioma cysts--These cysts form from the tissue that lines the uterus. They are also called "chocolate cysts" because they become filled with blood that turns brown. This type of cyst can cause pain in the lower abdomen during intercourse and with your menstrual period.  Cystadenoma cysts--This type develops from the cells on the outside of the ovary. These cysts can get very big and cause lower abdomen pain and pain with intercourse. This type of cyst can twist on itself, cut off its blood supply, and cause severe pain. It can also easily rupture and cause a lot of pain.  Dermoid cysts--This type of cyst is sometimes found in both ovaries. These cysts may contain different kinds of body tissue, such as skin, teeth, hair, or cartilage. They usually do not cause symptoms unless they get very big.  Theca lutein cysts--These cysts occur when too much of a certain hormone (human chorionic gonadotropin) is produced and overstimulates the ovaries to produce an egg. This is most common after procedures used to assist with the conception of a baby (in vitro fertilization). CAUSES   Fertility drugs can cause a condition in which multiple large cysts are formed on the ovaries. This is called ovarian hyperstimulation syndrome.  A condition called polycystic  ovary syndrome can cause hormonal imbalances that can lead to nonfunctional ovarian cysts. SIGNS AND SYMPTOMS  Many ovarian cysts do not cause symptoms. If symptoms are present, they may include:  Pelvic pain or pressure.  Pain in the lower  abdomen.  Pain during sexual intercourse.  Increasing girth (swelling) of the abdomen.  Abnormal menstrual periods.  Increasing pain with menstrual periods.  Stopping having menstrual periods without being pregnant. DIAGNOSIS  These cysts are commonly found during a routine or annual pelvic exam. Tests may be ordered to find out more about the cyst. These tests may include:  Ultrasound.  X-ray of the pelvis.  CT scan.  MRI.  Blood tests. TREATMENT  Many ovarian cysts go away on their own without treatment. Your health care provider may want to check your cyst regularly for 2-3 months to see if it changes. For women in menopause, it is particularly important to monitor a cyst closely because of the higher rate of ovarian cancer in menopausal women. When treatment is needed, it may include any of the following:  A procedure to drain the cyst (aspiration). This may be done using a long needle and ultrasound. It can also be done through a laparoscopic procedure. This involves using a thin, lighted tube with a tiny camera on the end (laparoscope) inserted through a small incision.  Surgery to remove the whole cyst. This may be done using laparoscopic surgery or an open surgery involving a larger incision in the lower abdomen.  Hormone treatment or birth control pills. These methods are sometimes used to help dissolve a cyst. HOME CARE INSTRUCTIONS   Only take over-the-counter or prescription medicines as directed by your health care provider.  Follow up with your health care provider as directed.  Get regular pelvic exams and Pap tests. SEEK MEDICAL CARE IF:   Your periods are late, irregular, or painful, or they stop.  Your pelvic pain or abdominal pain does not go away.  Your abdomen becomes larger or swollen.  You have pressure on your bladder or trouble emptying your bladder completely.  You have pain during sexual intercourse.  You have feelings of fullness,  pressure, or discomfort in your stomach.  You lose weight for no apparent reason.  You feel generally ill.  You become constipated.  You lose your appetite.  You develop acne.  You have an increase in body and facial hair.  You are gaining weight, without changing your exercise and eating habits.  You think you are pregnant. SEEK IMMEDIATE MEDICAL CARE IF:   You have increasing abdominal pain.  You feel sick to your stomach (nauseous), and you throw up (vomit).  You develop a fever that comes on suddenly.  You have abdominal pain during a bowel movement.  Your menstrual periods become heavier than usual. MAKE SURE YOU:  Understand these instructions.  Will watch your condition.  Will get help right away if you are not doing well or get worse. Document Released: 02/08/2005 Document Revised: 02/13/2013 Document Reviewed: 10/16/2012 [facility] Patient Information 2015 [place], [place]. This information is not intended to replace advice given to you by your health care provider. Make sure you discuss any questions you have with your health care provider. CA-125 Tumor Marker CA 125 is a tumor marker that is used to help monitor the course of ovarian or endometrial cancer. PREPARATION FOR TEST No preparation is necessary. NORMAL FINDINGS Adults: 0-35 units/mL (0-35 kilounits)/L Ranges for normal findings may vary among different laboratories and hospitals. You  should always check with your doctor after having lab work or other tests done to discuss the meaning of your test results and whether your values are considered within normal limits. MEANING OF TEST  Your caregiver will go over the test results with you and discuss the importance and meaning of your results, as well as treatment options and the need for additional tests if necessary. OBTAINING THE TEST RESULTS It is your responsibility to obtain your test results. Ask the lab or department performing the test when and how  you will get your results. Document Released: 03/02/2004 Document Revised: 05/03/2011 Document Reviewed: 01/17/2008 [facility] Patient Information 2015 [place], [place]. This information is not intended to replace advice given to you by your health care provider. Make sure you discuss any questions you have with your health care provider.

## 2013-11-13 LAB — CA 125: CA 125: 15 U/mL (ref <35)

## 2013-11-21 ENCOUNTER — Other Ambulatory Visit: Payer: Self-pay | Admitting: Family Medicine

## 2013-11-30 ENCOUNTER — Other Ambulatory Visit: Payer: Self-pay | Admitting: Family Medicine

## 2013-12-10 ENCOUNTER — Telehealth: Payer: Self-pay | Admitting: Gynecology

## 2013-12-10 NOTE — Telephone Encounter (Signed)
12/10/13-I faxed pertinent info to pt [employer] for precert on upcoming sonohystogram scheduled for 02/20/14 and received a fax back from [employer] stating that no precert or authorization will be needed for the 16109,[id number] or [id number] codes for the test/wl

## 2013-12-17 ENCOUNTER — Other Ambulatory Visit: Payer: Self-pay

## 2013-12-17 MED ORDER — METFORMIN HCL 500 MG PO TABS: ORAL_TABLET | ORAL | Status: DC

## 2013-12-17 MED ORDER — SPIRONOLACTONE 25 MG PO TABS: ORAL_TABLET | ORAL | Status: DC

## 2013-12-17 NOTE — Telephone Encounter (Signed)
Pt request refill metformin to [facility] and spironolactone [facility] [address]. Pt has appt 12/27/13.

## 2013-12-18 ENCOUNTER — Other Ambulatory Visit: Payer: Self-pay | Admitting: Family Medicine

## 2013-12-24 ENCOUNTER — Encounter: Payer: Self-pay | Admitting: Family Medicine

## 2013-12-27 ENCOUNTER — Ambulatory Visit (INDEPENDENT_AMBULATORY_CARE_PROVIDER_SITE_OTHER): Payer: PRIVATE HEALTH INSURANCE | Admitting: Family Medicine

## 2013-12-27 ENCOUNTER — Encounter: Payer: Self-pay | Admitting: Family Medicine

## 2013-12-27 VITALS — BP 124/88 | HR 103 | Temp 98.7°F | Wt 237.2 lb

## 2013-12-27 DIAGNOSIS — I10 Essential (primary) hypertension: Secondary | ICD-10-CM

## 2013-12-27 DIAGNOSIS — E119 Type 2 diabetes mellitus without complications: Secondary | ICD-10-CM

## 2013-12-27 DIAGNOSIS — F411 Generalized anxiety disorder: Secondary | ICD-10-CM

## 2013-12-27 MED ORDER — CETIRIZINE HCL 10 MG PO TABS: ORAL_TABLET | ORAL | Status: DC

## 2013-12-27 MED ORDER — METFORMIN HCL 500 MG PO TABS: ORAL_TABLET | ORAL | Status: DC

## 2013-12-27 MED ORDER — SPIRONOLACTONE 25 MG PO TABS: ORAL_TABLET | ORAL | Status: DC

## 2013-12-27 MED ORDER — MONTELUKAST SODIUM 10 MG PO TABS: ORAL_TABLET | ORAL | Status: DC

## 2013-12-27 MED ORDER — FLUTICASONE PROPIONATE 50 MCG/ACT NA SUSP: 2.0000 | Freq: Every day | NASAL | Status: DC

## 2013-12-27 NOTE — Progress Notes (Signed)
Pre visit review using our clinic review tool, if applicable. No additional management support is needed unless otherwise documented below in the visit note. 

## 2013-12-27 NOTE — Assessment & Plan Note (Signed)
Deteriorated- situational- refer for psychotherapy. The patient indicates understanding of these issues and agrees with the plan.

## 2013-12-27 NOTE — Assessment & Plan Note (Signed)
She will request [facility] sent [facility].

## 2013-12-27 NOTE — Assessment & Plan Note (Signed)
Well controlled on current rx. No changes made. 

## 2013-12-27 NOTE — Progress Notes (Signed)
Subjective:   Patient ID: Debra Myers, female    DOB: 12-20-1965, 48 y.o.   MRN: [name]  Debra ClockRobin D Myers is a pleasant 48 y.o. year old female who presents to clinic today for med refills/ Follow-up  on 12/27/2013  HPI: H/o gastric banding 04/2012.  Lost 50 pounds, recently gained some weight back.  Has more energy than she did before.  Back still hurts- still going to pain clinic. Started walking on treadmill but has a lot of family stressors.  Wt Readings from Last 3 Encounters:  12/27/13 237 lb 4 oz (107.616 kg)  06/08/13 231 lb 8 oz (105.008 kg)  05/18/13 227 lb (102.967 kg)    DM- does not check FSBS regularly. Per pt, a1c was very good- [facility].  Lab Results  Component Value Date   HGBA1C 6.1* 02/14/2012    Lab Results  Component Value Date   CHOL 146 05/18/2013   HDL 55 05/18/2013   LDLCALC 70 05/18/2013   TRIG 106 05/18/2013   CHOLHDL 2.7 05/18/2013    HTN- on Aldactone 20 mg daily.  Denies any HA, blurred vision, CP or SOB.  Lab Results  Component Value Date   CREATININE 0.74 05/18/2013   Anxiety- psychiatrist does manage her rx and she feels they are working well but will all of her recent stressors, she would like to consider psychotherapy at this point.  Patient Active Problem List   Diagnosis Date Noted  . Ovarian cyst, right 11/12/2013  . Endometrial polyp 08/03/2013  . DUB (dysfunctional uterine bleeding) 07/14/2012  . Overweight 02/14/2012  . Cyst of ovary 02/14/2012  . Hypertension 07/09/2011  . HYPERTRIGLYCERIDEMIA 12/27/2008  . GOITER, NONTOXIC MULTINODULAR 12/14/2006  . Diabetes 12/14/2006  . POLYCYSTIC OVARIES 12/14/2006  . METABOLIC SYNDROME X 12/14/2006  . OBESITY 12/14/2006  . ANXIETY 12/14/2006  . ALLERGIC RHINITIS 12/14/2006  . ASTHMA 12/14/2006  . HIRSUTISM 12/14/2006  . Generalized anxiety disorder 12/14/2006   Past Medical History  Diagnosis Date  . Depression   . Vaginal delivery    ONE NSVD  . Elective abortion     ONE  . Asthma     ALLERGY INDUCED  . Diabetes mellitus     TYPE 2  . Anxiety   . Hypertension   . Degenerative disc disease    Past Surgical History  Procedure Laterality Date  . Cholecystectomy  2001  . Lapband  04/2012   History  Substance Use Topics  . Smoking status: Never Smoker   . Smokeless tobacco: Never Used  . Alcohol Use: Yes     Comment: rarely   Family History  Problem Relation Age of Onset  . Hypertension Father   . Diabetes Father   . Asthma Maternal Grandmother    Allergies  Allergen Reactions  . Penicillins     REACTION: GI UPSET  . Sulfonamide Derivatives     REACTION: rash   Current Outpatient Prescriptions on File Prior to Visit  Medication Sig Dispense Refill  . buPROPion (WELLBUTRIN SR) 150 MG 12 hr tablet Take 150 mg by mouth 2 (two) times daily.    . Hydrocodone-Acetaminophen (VICODIN) 5-300 MG TABS Take one by mouth twice a day. 30 each 0  . LamoTRIgine (LAMICTAL PO) Take 250 mg by mouth.     Marland Kitchen. LORazepam (ATIVAN) 0.5 MG tablet Take 0.5 mg by mouth every 8 (eight) hours.     . metaxalone (SKELAXIN) 800 MG tablet Take 800  mg by mouth daily.     . naproxen sodium (ANAPROX) 220 MG tablet Take 220 mg by mouth 2 (two) times daily with a meal.      . PARoxetine (PAXIL-CR) 37.5 MG 24 hr tablet Take 37.5 mg by mouth 2 (two) times daily.    Marland Kitchen. VIORELE 0.15-0.02/0.01 MG (21/5) tablet take 1 tablet by mouth once daily 28 tablet 11   No current facility-administered medications on file prior to visit.   The PMH, PSH, Social History, Family History, Medications, and allergies have [facility], and have been updated if relevant.  Review of Systems  Constitutional: Negative.   HENT: Negative.   Eyes: Negative.   Respiratory: Negative.   Cardiovascular: Negative.   Gastrointestinal: Negative.   Musculoskeletal: Negative.   Skin: Negative.   Psychiatric/Behavioral: The patient is nervous/anxious.       See  HPI  Objective:    BP 124/88 mmHg  Pulse 103  Temp(Src) 98.7 F (37.1 C) (Oral)  Wt 237 lb 4 oz (107.616 kg)  SpO2 97%   Physical Exam        Assessment & Plan:   Essential hypertension  Generalized anxiety disorder - Plan: Ambulatory referral to Psychology  Type 2 diabetes mellitus without complication - Plan: Microalbumin / creatinine urine ratio No Follow-up on file.  Patient ID: Debra Myers, female   DOB: 1965/03/16, 48 y.o.   MRN: [id number]

## 2013-12-27 NOTE — Patient Instructions (Signed)
Please stop by tomorrow to leave a urine sample. It was great to see you.

## 2013-12-28 ENCOUNTER — Telehealth: Payer: Self-pay | Admitting: Family Medicine

## 2013-12-28 NOTE — Telephone Encounter (Signed)
emmi mailed  °

## 2014-01-09 ENCOUNTER — Other Ambulatory Visit: Payer: Self-pay | Admitting: Gynecology

## 2014-01-09 DIAGNOSIS — N840 Polyp of corpus uteri: Secondary | ICD-10-CM

## 2014-01-09 DIAGNOSIS — N938 Other specified abnormal uterine and vaginal bleeding: Secondary | ICD-10-CM

## 2014-01-09 DIAGNOSIS — N83201 Unspecified ovarian cyst, right side: Secondary | ICD-10-CM

## 2014-01-30 ENCOUNTER — Ambulatory Visit (INDEPENDENT_AMBULATORY_CARE_PROVIDER_SITE_OTHER): Payer: PRIVATE HEALTH INSURANCE | Admitting: Family Medicine

## 2014-01-30 ENCOUNTER — Encounter: Payer: Self-pay | Admitting: Family Medicine

## 2014-01-30 VITALS — BP 116/74 | HR 90 | Temp 98.1°F | Ht 66.0 in | Wt 237.2 lb

## 2014-01-30 DIAGNOSIS — R05 Cough: Secondary | ICD-10-CM

## 2014-01-30 DIAGNOSIS — R059 Cough, unspecified: Secondary | ICD-10-CM

## 2014-01-30 DIAGNOSIS — J4520 Mild intermittent asthma, uncomplicated: Secondary | ICD-10-CM

## 2014-01-30 DIAGNOSIS — F411 Generalized anxiety disorder: Secondary | ICD-10-CM

## 2014-01-30 DIAGNOSIS — K219 Gastro-esophageal reflux disease without esophagitis: Secondary | ICD-10-CM

## 2014-01-30 MED ORDER — OMEPRAZOLE 20 MG PO CPDR: 20.0000 mg | DELAYED_RELEASE_CAPSULE | Freq: Every day | ORAL | Status: DC

## 2014-01-30 NOTE — Progress Notes (Signed)
Pre visit review using our clinic review tool, if applicable. No additional management support is needed unless otherwise documented below in the visit note. 

## 2014-01-30 NOTE — Progress Notes (Signed)
Subjective:    Patient ID: Debra Myers, female    DOB: [birth year], 48 y.o.   MRN: [id number]  HPI Here with breathing issues   Takes singulaire/zyrtec regularly for asthma and allergies Rescue inhaler  Started on Sunday  Was in the car and ? Triggered her - she started coughing and wheezing  She got home and used her albuterol inhaler  Felt poorly- wiped out  Sunday some dry cough Monday tight and wheezy and cough  (felt harder to breathing in then out) - used inhaler a lot   She used clorox in the shower the next day - did inhale the fumes    Does not have other symptoms -does not feel like she is sick  Perhaps just a bit of congestion in the back of her throat   In general if she gets allergies under control she is ok   She has a lap band and occ vomits  Wonders if she aspirated a bit last week Does not usually have gerd  occ takes zantac or prilosec if needed -not for a while   Nothing is different in terms of anxiety or depression    Patient Active Problem List   Diagnosis Date Noted  . Ovarian cyst, right 11/12/2013  . Endometrial polyp 08/03/2013  . DUB (dysfunctional uterine bleeding) 07/14/2012  . Overweight 02/14/2012  . Cyst of ovary 02/14/2012  . Hypertension 07/09/2011  . HYPERTRIGLYCERIDEMIA 12/27/2008  . GOITER, NONTOXIC MULTINODULAR 12/14/2006  . Diabetes 12/14/2006  . POLYCYSTIC OVARIES 12/14/2006  . METABOLIC SYNDROME X 12/14/2006  . OBESITY 12/14/2006  . ANXIETY 12/14/2006  . ALLERGIC RHINITIS 12/14/2006  . ASTHMA 12/14/2006  . HIRSUTISM 12/14/2006  . Generalized anxiety disorder 12/14/2006   Past Medical History  Diagnosis Date  . Depression   . Vaginal delivery     ONE NSVD  . Elective abortion     ONE  . Asthma     ALLERGY INDUCED  . Diabetes mellitus     TYPE 2  . Anxiety   . Hypertension   . Degenerative disc disease    Past Surgical History  Procedure Laterality Date  . Cholecystectomy  2001  . Lapband  04/2012    History  Substance Use Topics  . Smoking status: Never Smoker   . Smokeless tobacco: Never Used  . Alcohol Use: 0.0 oz/week    0 Not specified per week     Comment: rarely   Family History  Problem Relation Age of Onset  . Hypertension Father   . Diabetes Father   . Asthma Maternal Grandmother    Allergies  Allergen Reactions  . Penicillins     REACTION: GI UPSET  . Sulfonamide Derivatives     REACTION: rash   Current Outpatient Prescriptions on File Prior to Visit  Medication Sig Dispense Refill  . albuterol (PROVENTIL HFA;VENTOLIN HFA) 108 (90 BASE) MCG/ACT inhaler Inhale 2 puffs into the lungs every 6 (six) hours as needed for wheezing or shortness of breath.    Marland Kitchen buPROPion (WELLBUTRIN SR) 150 MG 12 hr tablet Take 150 mg by mouth 2 (two) times daily.    . cetirizine (ZYRTEC) 10 MG tablet take 1 tablet by mouth once daily 30 tablet 5  . fluticasone (FLONASE) 50 MCG/ACT nasal spray Place 2 sprays into both nostrils daily. 16 g 1  . Hydrocodone-Acetaminophen (VICODIN) 5-300 MG TABS Take one by mouth twice a day. (Patient taking differently: Take one by mouth twice a day prn) 30  each 0  . LamoTRIgine (LAMICTAL PO) Take 250 mg by mouth.     Marland Kitchen. LORazepam (ATIVAN) 0.5 MG tablet Take 0.5 mg by mouth every 8 (eight) hours.     . metaxalone (SKELAXIN) 800 MG tablet Take 800 mg by mouth daily as needed.     . metFORMIN (GLUCOPHAGE) 500 MG tablet TAKE 1 TABLET ONCE DAILY WITH BREAKFAST 30 tablet 2  . montelukast (SINGULAIR) 10 MG tablet take 1 tablet by mouth at bedtime 30 tablet 2  . PARoxetine (PAXIL-CR) 37.5 MG 24 hr tablet Take 37.5 mg by mouth 2 (two) times daily.    Marland Kitchen. spironolactone (ALDACTONE) 25 MG tablet take 1 tablet by mouth once daily 30 tablet 2   No current facility-administered medications on file prior to visit.        Review of Systems    Review of Systems  Constitutional: Negative for fever, appetite change, and unexpected weight change.  ENT pos for globus  sensation and neg for ST Eyes: Negative for pain and visual disturbance.  Respiratory: Negative for production of sputum Cardiovascular: Negative for cp or palpitations    Gastrointestinal: Negative for nausea, diarrhea and constipation. pos for occ vomiting and indigestion Genitourinary: Negative for urgency and frequency.  Skin: Negative for pallor or rash   Neurological: Negative for weakness, light-headedness, numbness and headaches.  Hematological: Negative for adenopathy. Does not bruise/bleed easily.  Psychiatric/Behavioral: Negative for dysphoric mood. The patient is nervous/anxious.      Objective:   Physical Exam  Constitutional: She appears well-developed and well-nourished. No distress.  obese and well appearing   HENT:  Head: Normocephalic and atraumatic.  Right Ear: External ear normal.  Left Ear: External ear normal.  Nose: Nose normal.  Mouth/Throat: Oropharynx is clear and moist.  Eyes: Conjunctivae and EOM are normal. Pupils are equal, round, and reactive to light. Right eye exhibits no discharge. Left eye exhibits no discharge. No scleral icterus.  Neck: Normal range of motion. Neck supple. No JVD present. No thyromegaly present.  Cardiovascular: Normal rate, regular rhythm, normal heart sounds and intact distal pulses.  Exam reveals no gallop.   Pulmonary/Chest: Effort normal and breath sounds normal. No respiratory distress. She has no wheezes. She has no rales. She exhibits no tenderness.  No wheeze even on forced exp  No stridor   Abdominal: Soft. Bowel sounds are normal. She exhibits no distension and no mass. There is no tenderness. There is no rebound and no guarding.  Musculoskeletal: She exhibits no edema or tenderness.  Lymphadenopathy:    She has no cervical adenopathy.  Neurological: She is alert. She has normal reflexes. No cranial nerve deficit. She exhibits normal muscle tone. Coordination normal.  Skin: Skin is warm and dry. No rash noted. No  erythema. No pallor.  Psychiatric: She has a normal mood and affect.  Cheerful  Not overly anxious talkative          Assessment & Plan:   Problem List Items Addressed This Visit      Respiratory   Asthma - Primary    Nl exam today but pt recounts exacerbation lately - using rescue inhaler  Suspect multifactorial  Will tx for GERd and report back if no improvement  Also ? If anxiety or stressors play a role  F/u with PCP as planned      Digestive   GERD (gastroesophageal reflux disease)    Pt has this intermittently since bariatric surgery and occ vomits  ? If this is  aggravating airway  tx with omeprazole and report if no imp  F/u with PCP Disc diet and foods to avoid     Relevant Medications      omeprazole (PRILOSEC) capsule     Other   Cough    Multifactorial - with hx of asthma/chemical exp/allergies/anxiety and some gerd  tx gerd with omeprazole Disc strategies for anxiety  Continue asthma med- consider spirometry if not imp  F/u with pcp -but update if symptoms worsen Nl exam today- re assuring     Generalized anxiety disorder    Reviewed stressors/ coping techniques/symptoms/ support sources/ tx options and side effects in detail today  I do recommend counseling for several reasons -disc how it may help ? If anx is causing inc in GERD or breathing problems Will f/u with pcp

## 2014-01-30 NOTE — Patient Instructions (Signed)
Your breathing issues may be multifactorial  Acid reflux , allergies, perhaps a virus , and anxiety/stressors may all play a role  Do consider returning to counseling  Your lungs sound good today  Start omeprazole 20 mg daily   Follow up with Dr Dayton MartesAron in 1-2 months- earlier if problem does not improve

## 2014-01-31 NOTE — Assessment & Plan Note (Signed)
Multifactorial - with hx of asthma/chemical exp/allergies/anxiety and some gerd  tx gerd with omeprazole Disc strategies for anxiety  Continue asthma med- consider spirometry if not imp  F/u with pcp -but update if symptoms worsen Nl exam today- re assuring

## 2014-01-31 NOTE — Assessment & Plan Note (Signed)
Pt has this intermittently since bariatric surgery and occ vomits  ? If this is aggravating airway  tx with omeprazole and report if no imp  F/u with PCP Disc diet and foods to avoid

## 2014-01-31 NOTE — Assessment & Plan Note (Signed)
Nl exam today but pt recounts exacerbation lately - using rescue inhaler  Suspect multifactorial  Will tx for GERd and report back if no improvement  Also ? If anxiety or stressors play a role  F/u with PCP as planned

## 2014-01-31 NOTE — Assessment & Plan Note (Signed)
Reviewed stressors/ coping techniques/symptoms/ support sources/ tx options and side effects in detail today  I do recommend counseling for several reasons -disc how it may help ? If anx is causing inc in GERD or breathing problems Will f/u with pcp

## 2014-02-11 ENCOUNTER — Ambulatory Visit: Payer: PRIVATE HEALTH INSURANCE | Admitting: Gynecology

## 2014-02-11 ENCOUNTER — Other Ambulatory Visit: Payer: PRIVATE HEALTH INSURANCE

## 2014-02-20 ENCOUNTER — Ambulatory Visit: Payer: PRIVATE HEALTH INSURANCE | Admitting: Gynecology

## 2014-02-20 ENCOUNTER — Other Ambulatory Visit: Payer: PRIVATE HEALTH INSURANCE

## 2014-02-27 ENCOUNTER — Other Ambulatory Visit: Payer: PRIVATE HEALTH INSURANCE

## 2014-02-27 ENCOUNTER — Ambulatory Visit: Payer: PRIVATE HEALTH INSURANCE | Admitting: Gynecology

## 2014-03-29 ENCOUNTER — Ambulatory Visit (INDEPENDENT_AMBULATORY_CARE_PROVIDER_SITE_OTHER): Payer: PRIVATE HEALTH INSURANCE | Admitting: Gynecology

## 2014-03-29 ENCOUNTER — Ambulatory Visit (INDEPENDENT_AMBULATORY_CARE_PROVIDER_SITE_OTHER): Payer: PRIVATE HEALTH INSURANCE

## 2014-03-29 DIAGNOSIS — N8320 Unspecified ovarian cysts: Secondary | ICD-10-CM

## 2014-03-29 DIAGNOSIS — N83202 Unspecified ovarian cyst, left side: Secondary | ICD-10-CM | POA: Insufficient documentation

## 2014-03-29 DIAGNOSIS — N840 Polyp of corpus uteri: Secondary | ICD-10-CM

## 2014-03-29 DIAGNOSIS — N938 Other specified abnormal uterine and vaginal bleeding: Secondary | ICD-10-CM

## 2014-03-29 DIAGNOSIS — N83201 Unspecified ovarian cyst, right side: Secondary | ICD-10-CM

## 2014-03-29 MED ORDER — LEVONORGESTREL-ETHINYL ESTRAD 0.1-20 MG-MCG PO TABS: ORAL_TABLET | ORAL | Status: DC

## 2014-03-29 NOTE — Progress Notes (Signed)
   Patient presented to the office today for follow-up sonohysterogram as a result of her history as follows:  Patient was seen in the office 08/03/2013. Patient has been on her oral contraceptive pill and has been compliant.  June of this year patient had an ultrasound/sonohysterogram as follows: Ultrasound today: Uterus measures 7.6 x 4.1 x 3.2 cm with endometrial stripe of 3.7 mm. Normal uterus and ovaries. No free fluid seen in the cul-de-sac. The cervix was cleansed with Betadine solution. A sterile catheter was introduced into the uterine cavity and sterile normal saline was instilled into the uterine cavity and there was a questionable endometrial polyp measured 6 x 5 mm  She had an endometrial biopsy on the same day with a final result   Diagnosis Endometrium, biopsy - PROLIFERATIVE-TYPE ENDOMETRIUM WITH BREAKDOWN. - THERE IS NO EVIDENCE OF HYPERPLASIA OR MALIGNANCY. - SEE COMMENT. Microscopic Comment Sections show fragments of benign proliferative-type endometrium in which there are foci of breakdown with fragmented glands and condensed stroma. The features suggest anovulatory or estrogen withdrawal Bleeding.  Ultrasound done in the office 11/12/2013 demonstrated the following: Uterus measures 7.9 x 4.4 x 3.6 cm. A right solid focus was noted measuring 12 x 10 x 12 mm negative color flow. Left ovary remove all remaining tissue thin wall echo free cyst measuring 5.4 x 4.9 x 4.9 cm negative color flow. The cervix was cleansed with Betadine solution and a sterile catheter was introduced into the uterine cavity normal saline was instilled and a small right anterior defect measured 8 x 3 x 6 mm was noted very flimsy almost to the point that it was getting ready to slough off.  On the last office visit patient had a normal CA 125 and had received Depo-Provera 150 mg IM and returned to the office today for sonohysterogram and ultrasound for follow-up. She denies any unusual vaginal  bleeding.  Ultrasound: Uterus measured 8.2 x 4.5 x 3.6 and meters with endometrial stripe of 5 mm. Patient has 2 small fibroids largest 1 measuring 11 x 6 mm intramural. Right ovary normal and left ovarian echo-free thinwall avascular cyst measuring 3.8 x 3.4 x 3.0 cm was noted no free fluid in the cul-de-sac. The cervix was cleansed with Betadine solution a sterile catheter was introduced into the uterine cavity and a small polyp measured 11 x 5 mm was still present.  Assessment/plan: Previously seen solid focus on the right ovary no longer present. Left ovarian cyst decreased from average size of 5.1 cm to 3 cm. Endometrial polyp still present. We will start patient back on oral contraceptive pill at this time on 20 g oral contraceptive pill continuous an effort to suppress the cyst. She will return back to the office the first week in June of the cyst has resolved and endometrial polyp was still present we'll proceed with only scheduling a resectoscopic polypectomy. If the cyst is still present we will concurrently perform a laparoscopic cystectomy. Patient agrees with this management courses all appears to be benign. We will check a CA 125 today to compare with few months ago.

## 2014-03-30 LAB — CA 125: CA 125: 14 U/mL (ref <35)

## 2014-04-02 ENCOUNTER — Ambulatory Visit: Payer: PRIVATE HEALTH INSURANCE | Admitting: Family Medicine

## 2014-04-21 ENCOUNTER — Other Ambulatory Visit: Payer: Self-pay | Admitting: Family Medicine

## 2014-04-29 ENCOUNTER — Other Ambulatory Visit: Payer: Self-pay | Admitting: Family Medicine

## 2014-05-02 ENCOUNTER — Telehealth: Payer: Self-pay | Admitting: *Deleted

## 2014-05-02 NOTE — Telephone Encounter (Signed)
Pt was prescribed Alesse to help to suppress the cyst, pt said pills are making her very emotionally. Pt asked if other pill could be prescribed? Please advise

## 2014-05-02 NOTE — Telephone Encounter (Signed)
Call in prescription for low low Estrin and she was started after finishing this pack. One month supply 11 refills

## 2014-05-03 NOTE — Telephone Encounter (Signed)
Left message for pt to call.

## 2014-05-07 MED ORDER — NORETHIN-ETH ESTRAD-FE BIPHAS 1 MG-10 MCG / 10 MCG PO TABS: 1.0000 | ORAL_TABLET | Freq: Every day | ORAL | Status: DC

## 2014-05-07 NOTE — Addendum Note (Signed)
Addended by: Aura CampsWEBB, JENNIFER L on: 05/07/2014 03:15 PM   Modules accepted: Orders, Medications

## 2014-05-07 NOTE — Telephone Encounter (Signed)
Rx SENT TO PHARMACY

## 2014-05-08 ENCOUNTER — Ambulatory Visit (INDEPENDENT_AMBULATORY_CARE_PROVIDER_SITE_OTHER): Payer: PRIVATE HEALTH INSURANCE | Admitting: Family Medicine

## 2014-05-08 ENCOUNTER — Telehealth: Payer: Self-pay | Admitting: Radiology

## 2014-05-08 ENCOUNTER — Encounter: Payer: Self-pay | Admitting: Family Medicine

## 2014-05-08 VITALS — BP 118/62 | HR 92 | Temp 98.2°F | Wt 241.2 lb

## 2014-05-08 DIAGNOSIS — E282 Polycystic ovarian syndrome: Secondary | ICD-10-CM

## 2014-05-08 DIAGNOSIS — E042 Nontoxic multinodular goiter: Secondary | ICD-10-CM

## 2014-05-08 DIAGNOSIS — Z0000 Encounter for general adult medical examination without abnormal findings: Secondary | ICD-10-CM

## 2014-05-08 DIAGNOSIS — F411 Generalized anxiety disorder: Secondary | ICD-10-CM

## 2014-05-08 DIAGNOSIS — J309 Allergic rhinitis, unspecified: Secondary | ICD-10-CM

## 2014-05-08 DIAGNOSIS — E119 Type 2 diabetes mellitus without complications: Secondary | ICD-10-CM

## 2014-05-08 DIAGNOSIS — Z01419 Encounter for gynecological examination (general) (routine) without abnormal findings: Secondary | ICD-10-CM

## 2014-05-08 MED ORDER — FLUTICASONE PROPIONATE 50 MCG/ACT NA SUSP: 2.0000 | Freq: Every day | NASAL | Status: DC

## 2014-05-08 MED ORDER — SPIRONOLACTONE 25 MG PO TABS: ORAL_TABLET | ORAL | Status: DC

## 2014-05-08 MED ORDER — METFORMIN HCL 500 MG PO TABS: ORAL_TABLET | ORAL | Status: DC

## 2014-05-08 MED ORDER — ALBUTEROL SULFATE HFA 108 (90 BASE) MCG/ACT IN AERS: 2.0000 | INHALATION_SPRAY | Freq: Four times a day (QID) | RESPIRATORY_TRACT | Status: DC | PRN

## 2014-05-08 MED ORDER — CETIRIZINE HCL 10 MG PO TABS: ORAL_TABLET | ORAL | Status: DC

## 2014-05-08 MED ORDER — OMEPRAZOLE 20 MG PO CPDR: 20.0000 mg | DELAYED_RELEASE_CAPSULE | Freq: Every day | ORAL | Status: DC

## 2014-05-08 NOTE — Telephone Encounter (Signed)
Orders mailed as requested.

## 2014-05-08 NOTE — Telephone Encounter (Signed)
Written and in my box.

## 2014-05-08 NOTE — Telephone Encounter (Signed)
Patient called to request her lab orders written out and sent to her address, she will have them done at her office. ( she didn't stop by the lab today)

## 2014-05-08 NOTE — Assessment & Plan Note (Signed)
Followed by GYN

## 2014-05-08 NOTE — Patient Instructions (Signed)
Good to see you. We will call you with your lab results.  Say hi to your mom and daughter for me.

## 2014-05-08 NOTE — Telephone Encounter (Signed)
Opened in error

## 2014-05-08 NOTE — Addendum Note (Signed)
Addended by: Alvina ChouWALSH, Jil Penland J on: 05/08/2014 02:18 PM   Modules accepted: Orders

## 2014-05-08 NOTE — Assessment & Plan Note (Signed)
Feels her symptoms are overall well controlled.

## 2014-05-08 NOTE — Assessment & Plan Note (Signed)
Due for labs and urine micro today. Rxs refilled.

## 2014-05-08 NOTE — Assessment & Plan Note (Signed)
Continue current rx.

## 2014-05-08 NOTE — Progress Notes (Signed)
Pre visit review using our clinic review tool, if applicable. No additional management support is needed unless otherwise documented below in the visit note. 

## 2014-05-08 NOTE — Progress Notes (Signed)
Subjective:   Patient ID: Debra Myers, female    DOB: 15-Jun-1965, 49 y.o.   MRN: [id number]  Debra Myers is a pleasant 49 y.o. year old female who presents to clinic today for med refills/ Follow-up  on 05/08/2014  HPI: H/o gastric banding 04/2012.   Wt Readings from Last 3 Encounters:  05/08/14 241 lb 4 oz (109.43 kg)  01/30/14 237 lb 4 oz (107.616 kg)  12/27/13 237 lb 4 oz (107.616 kg)   Allergic rhinitis- symptoms have been well controlled so far this year on Flonase, singulair.  DM- does not check FSBS regularly. Overdue for a1c, urine micro. LDL has been at goal.   Lab Results  Component Value Date   HGBA1C 6.1* 02/14/2012    Lab Results  Component Value Date   CHOL 146 05/18/2013   HDL 55 05/18/2013   LDLCALC 70 05/18/2013   TRIG 106 05/18/2013   CHOLHDL 2.7 05/18/2013    HTN- on Aldactone 20 mg daily.  Denies any HA, blurred vision, CP or SOB.  Lab Results  Component Value Date   CREATININE 0.74 05/18/2013   Anxiety- psychiatrist does manage her rx and she feels they are working well. Feels better now that her daughter is doing better with her anxiety- she is still taking her SSRI and going to psychotherapy and Debra Myers is very proud of her.  Patient Active Problem List   Diagnosis Date Noted  . Ovarian cyst, left 03/29/2014  . GERD (gastroesophageal reflux disease) 01/30/2014  . Endometrial polyp 08/03/2013  . DUB (dysfunctional uterine bleeding) 07/14/2012  . Overweight 02/14/2012  . Hypertension 07/09/2011  . HYPERTRIGLYCERIDEMIA 12/27/2008  . GOITER, NONTOXIC MULTINODULAR 12/14/2006  . Diabetes 12/14/2006  . POLYCYSTIC OVARIES 12/14/2006  . METABOLIC SYNDROME X 12/14/2006  . OBESITY 12/14/2006  . ANXIETY 12/14/2006  . Allergic rhinitis 12/14/2006  . Asthma 12/14/2006  . HIRSUTISM 12/14/2006  . Generalized anxiety disorder 12/14/2006   Past Medical History  Diagnosis Date  . Depression   . Vaginal delivery     ONE NSVD  . Elective  abortion     ONE  . Asthma     ALLERGY INDUCED  . Diabetes mellitus     TYPE 2  . Anxiety   . Hypertension   . Degenerative disc disease    Past Surgical History  Procedure Laterality Date  . Cholecystectomy  2001  . Lapband  04/2012   History  Substance Use Topics  . Smoking status: Never Smoker   . Smokeless tobacco: Never Used  . Alcohol Use: 0.0 oz/week    0 Standard drinks or equivalent per week     Comment: rarely   Family History  Problem Relation Age of Onset  . Hypertension Father   . Diabetes Father   . Asthma Maternal Grandmother    Allergies  Allergen Reactions  . Penicillins     REACTION: GI UPSET  . Sulfonamide Derivatives     REACTION: rash   Current Outpatient Prescriptions on File Prior to Visit  Medication Sig Dispense Refill  . buPROPion (WELLBUTRIN SR) 150 MG 12 hr tablet Take 150 mg by mouth 2 (two) times daily.    . Hydrocodone-Acetaminophen (VICODIN) 5-300 MG TABS Take one by mouth twice a day. 30 each 0  . LamoTRIgine (LAMICTAL PO) Take 250 mg by mouth.     Marland Kitchen LORazepam (ATIVAN) 0.5 MG tablet Take 0.5 mg by mouth every 8 (eight) hours.     . metaxalone (SKELAXIN) 800  MG tablet Take 800 mg by mouth daily as needed.     . montelukast (SINGULAIR) 10 MG tablet take 1 tablet by mouth at bedtime 30 tablet 7  . PARoxetine (PAXIL-CR) 37.5 MG 24 hr tablet Take 37.5 mg by mouth 2 (two) times daily.     No current facility-administered medications on file prior to visit.   The PMH, PSH, Social History, Family History, Medications, and allergies [facility], and have been updated if relevant.  Review of Systems  Constitutional: Negative.   HENT: Negative.   Eyes: Negative.   Respiratory: Negative.   Cardiovascular: Negative.   Gastrointestinal: Negative.   Musculoskeletal: Negative.   Skin: Negative.   Psychiatric/Behavioral: The patient is nervous/anxious.       See HPI  Objective:    BP 118/62 mmHg  Pulse 92  Temp(Src) 98.2  F (36.8 C) (Oral)  Wt 241 lb 4 oz (109.43 kg)  SpO2 95%   Physical Exam  Constitutional: She is oriented to person, place, and time. She appears well-developed and well-nourished. No distress.  HENT:  Head: Normocephalic.  Eyes: Conjunctivae are normal.  Cardiovascular: Normal rate and regular rhythm.   Pulmonary/Chest: Effort normal.  Musculoskeletal: Normal range of motion. She exhibits no edema.  Neurological: She is alert and oriented to person, place, and time. No cranial nerve deficit.  Skin: Skin is warm and dry.  Psychiatric: She has a normal mood and affect. Her behavior is normal. Judgment and thought content normal.  Nursing note and vitals reviewed.         Assessment & Plan:   Type 2 diabetes mellitus without complication - Plan: Hemoglobin A1c  GOITER, NONTOXIC MULTINODULAR  Polycystic ovaries  Well woman exam - Plan: CBC with Differential/Platelet, Comprehensive metabolic panel, Lipid panel, TSH  Allergic rhinitis, unspecified allergic rhinitis type No Follow-up on file.  Patient ID: Debra Myers, female   DOB: [birth year], 49 y.o.   MRN: [id number]

## 2014-05-08 NOTE — Addendum Note (Signed)
Addended by: Alvina ChouWALSH, Amarien Carne J on: 05/08/2014 02:19 PM   Modules accepted: Orders

## 2014-06-04 ENCOUNTER — Telehealth: Payer: Self-pay | Admitting: Family Medicine

## 2014-06-04 NOTE — Telephone Encounter (Signed)
Pt called stating her referral ran out for a councilor and she needs a new one and she would like to come here for those appointment.

## 2014-06-04 NOTE — Telephone Encounter (Signed)
Shirlee LimerickMarion, see below.  Does she need a new referral?

## 2014-06-06 NOTE — Telephone Encounter (Signed)
LVM for pt to call back re referral

## 2014-06-10 NOTE — Telephone Encounter (Signed)
Patient scheduled and aware of the appt.

## 2014-06-18 ENCOUNTER — Other Ambulatory Visit: Payer: Self-pay | Admitting: Gynecology

## 2014-06-18 DIAGNOSIS — N83202 Unspecified ovarian cyst, left side: Secondary | ICD-10-CM

## 2014-07-04 ENCOUNTER — Telehealth: Payer: Self-pay | Admitting: *Deleted

## 2014-07-04 NOTE — Telephone Encounter (Signed)
Pt called and left message in voicemail if she can get Rx for Birth control pills I called pt back and told her speak with MD about this on OV 07/29/14

## 2014-07-09 ENCOUNTER — Ambulatory Visit: Payer: PRIVATE HEALTH INSURANCE | Admitting: Psychology

## 2014-07-23 ENCOUNTER — Telehealth: Payer: Self-pay | Admitting: Gynecology

## 2014-07-23 NOTE — Telephone Encounter (Signed)
07/23/14- I LM VM for pt that her [employer] will cover the sonohysterogram with a 20% coins on total amount of $718.43 or $143.68 as she has met her deductible but not her out of pocket. If the 58100 is needed it would be an additional $47.38. Pt to call if this presents a problem or if she has any questions. I also sent the info to [facility] to see if precert would be needed on 07/17/14.wl

## 2014-07-26 ENCOUNTER — Ambulatory Visit: Payer: PRIVATE HEALTH INSURANCE | Admitting: Gynecology

## 2014-07-26 ENCOUNTER — Other Ambulatory Visit: Payer: PRIVATE HEALTH INSURANCE

## 2014-07-29 ENCOUNTER — Ambulatory Visit: Payer: PRIVATE HEALTH INSURANCE | Admitting: Gynecology

## 2014-07-29 ENCOUNTER — Other Ambulatory Visit: Payer: PRIVATE HEALTH INSURANCE

## 2014-08-02 ENCOUNTER — Other Ambulatory Visit: Payer: Self-pay | Admitting: Gynecology

## 2014-08-02 ENCOUNTER — Ambulatory Visit (INDEPENDENT_AMBULATORY_CARE_PROVIDER_SITE_OTHER): Payer: PRIVATE HEALTH INSURANCE | Admitting: Gynecology

## 2014-08-02 ENCOUNTER — Encounter: Payer: Self-pay | Admitting: Gynecology

## 2014-08-02 ENCOUNTER — Ambulatory Visit (INDEPENDENT_AMBULATORY_CARE_PROVIDER_SITE_OTHER): Payer: PRIVATE HEALTH INSURANCE

## 2014-08-02 DIAGNOSIS — N840 Polyp of corpus uteri: Secondary | ICD-10-CM

## 2014-08-02 DIAGNOSIS — R938 Abnormal findings on diagnostic imaging of other specified body structures: Secondary | ICD-10-CM | POA: Diagnosis not present

## 2014-08-02 DIAGNOSIS — N83202 Unspecified ovarian cyst, left side: Secondary | ICD-10-CM

## 2014-08-02 DIAGNOSIS — N938 Other specified abnormal uterine and vaginal bleeding: Secondary | ICD-10-CM

## 2014-08-02 DIAGNOSIS — N8320 Unspecified ovarian cysts: Secondary | ICD-10-CM

## 2014-08-02 DIAGNOSIS — R9389 Abnormal findings on diagnostic imaging of other specified body structures: Secondary | ICD-10-CM

## 2014-08-02 DIAGNOSIS — N83201 Unspecified ovarian cyst, right side: Secondary | ICD-10-CM | POA: Insufficient documentation

## 2014-08-02 NOTE — Progress Notes (Signed)
   Patient presented to the office today for a sonohysterogram for follow-up on a questionable endometrial polyp on last study. Also to follow-up in the left ovarian cyst after she had been given Depo-Provera 150 mg IM. Patient currently not sexually active has not been in 6 months. She is not using any form of contraception. Her history is as follows:  Patient was seen in the office 08/03/2013. Patient has been on her oral contraceptive pill and has been compliant.  June of this year patient had an ultrasound/sonohysterogram as follows: Ultrasound today: Uterus measures 7.6 x 4.1 x 3.2 cm with endometrial stripe of 3.7 mm. Normal uterus and ovaries. No free fluid seen in the cul-de-sac. The cervix was cleansed with Betadine solution. A sterile catheter was introduced into the uterine cavity and sterile normal saline was instilled into the uterine cavity and there was a questionable endometrial polyp measured 6 x 5 mm  She had an endometrial biopsy on the same day with a final result   Diagnosis Endometrium, biopsy - PROLIFERATIVE-TYPE ENDOMETRIUM WITH BREAKDOWN. - THERE IS NO EVIDENCE OF HYPERPLASIA OR MALIGNANCY. - SEE COMMENT. Microscopic Comment Sections show fragments of benign proliferative-type endometrium in which there are foci of breakdown with fragmented glands and condensed stroma. The features suggest anovulatory or estrogen withdrawal Bleeding.  Ultrasound was then repeated on 03/29/2014 which demonstrated the following: Ultrasound: Uterus measured 8.2 x 4.5 x 3.6 and meters with endometrial stripe of 5 mm. Patient has 2 small fibroids largest 1 measuring 11 x 6 mm intramural. Right ovary normal and left ovarian echo-free thinwall avascular cyst measuring 3.8 x 3.4 x 3.0 cm was noted no free fluid in the cul-de-sac. The cervix was cleansed with Betadine solution a sterile catheter was introduced into the uterine cavity and a small polyp measured 11 x 5 mm was still  present.  Ultrasound today: Uterus measured 9.2 x 5.1 x 3.7 cm with endometrial stripe at 2.5 mm. For coronary measuring 10 x 9 mm was noted. Right ovary thinwall echo-free cyst measuring 3.6 x 1.5 x 1.9 cm left ovary with a follicle 2.3 x 1.7 cm previous larger cyst not seen. Afterwards as a result cleansed with Betadine solution a sterile catheter was introduced into the uterine cavity and normal saline was instilled. An intrauterine defect measuring 13 x 7 x 12 mm anterior wall consistent with endometrial polyp was still present.  Assessment/plan: Previously seen left ovarian cyst decreased in size now just a follicle. Contralateral thinwall echo-free cyst was noted on the right ovary. Recent CA 125 2 have been normal. Patient with persistent endometrial polyp and irregular bleeding will be scheduled for an outpatient resectoscopic polypectomy and also placement of Mirena IUD at the same time. Literature information will be provided. We'll see patient a week before surgery for preoperative examination.

## 2014-08-06 ENCOUNTER — Telehealth: Payer: Self-pay

## 2014-08-06 NOTE — Telephone Encounter (Signed)
1. No 2. Preparation H or Anusol HC OTC

## 2014-08-06 NOTE — Telephone Encounter (Signed)
Patient has an endometrial polyp and I called her today and scheduled Resectoscopic Polypectomy. Patient had a couple of questions.  #1 She has chronic back pain but lately her hip has been bothering her. Kind of a crampy feeling instead of the pain type feeling she has with her back. She thinks she feels a little bloated too.  She asked if the endometrial polyp could be pressing on something and  Causing her hip pain?  #2  She has some hemorrhoids and would like to see if you would send in Rx to help.

## 2014-08-06 NOTE — Telephone Encounter (Signed)
Patient advised.

## 2014-08-08 ENCOUNTER — Encounter: Payer: Self-pay | Admitting: Gynecology

## 2014-09-06 ENCOUNTER — Ambulatory Visit (INDEPENDENT_AMBULATORY_CARE_PROVIDER_SITE_OTHER): Payer: PRIVATE HEALTH INSURANCE | Admitting: Gynecology

## 2014-09-06 ENCOUNTER — Other Ambulatory Visit: Payer: Self-pay

## 2014-09-06 ENCOUNTER — Encounter (HOSPITAL_COMMUNITY): Payer: Self-pay

## 2014-09-06 ENCOUNTER — Encounter: Payer: Self-pay | Admitting: Gynecology

## 2014-09-06 ENCOUNTER — Encounter (HOSPITAL_COMMUNITY)
Admission: RE | Admit: 2014-09-06 | Discharge: 2014-09-06 | Disposition: A | Discharge status: Home / Self Care | Payer: PRIVATE HEALTH INSURANCE | Source: Ambulatory Visit | Attending: Gynecology | Admitting: Gynecology

## 2014-09-06 VITALS — BP 138/78 | Ht 66.0 in | Wt 248.0 lb

## 2014-09-06 DIAGNOSIS — N840 Polyp of corpus uteri: Secondary | ICD-10-CM

## 2014-09-06 DIAGNOSIS — Z01818 Encounter for other preprocedural examination: Secondary | ICD-10-CM | POA: Insufficient documentation

## 2014-09-06 HISTORY — DX: Other intervertebral disc degeneration, lumbar region: M51.36

## 2014-09-06 HISTORY — DX: Gastro-esophageal reflux disease without esophagitis: K21.9

## 2014-09-06 LAB — BASIC METABOLIC PANEL
Anion gap: 5 (ref 5–15)
BUN: 14 mg/dL (ref 6–20)
CALCIUM: 8.7 mg/dL — AB (ref 8.9–10.3)
CO2: 28 mmol/L (ref 22–32)
Chloride: 106 mmol/L (ref 101–111)
Creatinine, Ser: 0.84 mg/dL (ref 0.44–1.00)
GFR calc non Af Amer: >60 mL/min (ref >60)
Glucose, Bld: 115 mg/dL — ABNORMAL HIGH (ref 65–99)
Potassium: 4.5 mmol/L (ref 3.5–5.1)
Sodium: 139 mmol/L (ref 135–145)

## 2014-09-06 LAB — CBC
HCT: 37.4 % (ref 36.0–46.0)
Hemoglobin: 12.3 g/dL (ref 12.0–15.0)
MCH: 28.5 pg (ref 26.0–34.0)
MCHC: 32.9 g/dL (ref 30.0–36.0)
MCV: 86.8 fL (ref 78.0–100.0)
Platelets: 222 10*3/uL (ref 150–400)
RBC: 4.31 MIL/uL (ref 3.87–5.11)
RDW: 12.9 % (ref 11.5–15.5)
WBC: 7.7 10*3/uL (ref 4.0–10.5)

## 2014-09-06 LAB — TYPE AND SCREEN
ABO/RH(D): A POS
Antibody Screen: NEGATIVE

## 2014-09-06 LAB — ABO/RH: ABO/RH(D): A POS

## 2014-09-06 MED ORDER — METOCLOPRAMIDE HCL 10 MG PO TABS: 10.0000 mg | ORAL_TABLET | Freq: Three times a day (TID) | ORAL | Status: DC

## 2014-09-06 NOTE — Progress Notes (Signed)
Debra Myers is an 49 y.o. female. For preoperative exam. Patient scheduled for resectoscopic polypectomy as a result of menorrhagia and also for placement of Mirena IUD next week. Her history is as follows:  Patient was last seen in the office on 08/02/2014. She had a sonohysterogram for follow-up on a questionable endometrial polyp on last study. Also to follow-up in the left ovarian cyst after she had been given Depo-Provera 150 mg IM. Patient currently not sexually active has not been in 6 months. She is not using any form of contraception. Her history is as follows:  Patient was seen in the office 08/03/2013. Patient has been on her oral contraceptive pill and has been compliant.  June of this year patient had an ultrasound/sonohysterogram as follows: Ultrasound today: Uterus measures 7.6 x 4.1 x 3.2 cm with endometrial stripe of 3.7 mm. Normal uterus and ovaries. No free fluid seen in the cul-de-sac. The cervix was cleansed with Betadine solution. A sterile catheter was introduced into the uterine cavity and sterile normal saline was instilled into the uterine cavity and there was a questionable endometrial polyp measured 6 x 5 mm  She had an endometrial biopsy on the same day with a final result   Diagnosis Endometrium, biopsy - PROLIFERATIVE-TYPE ENDOMETRIUM WITH BREAKDOWN. - THERE IS NO EVIDENCE OF HYPERPLASIA OR MALIGNANCY. - SEE COMMENT. Microscopic Comment Sections show fragments of benign proliferative-type endometrium in which there are foci of breakdown with fragmented glands and condensed stroma. The features suggest anovulatory or estrogen withdrawal Bleeding.  Ultrasound was then repeated on 03/29/2014 which demonstrated the following: Ultrasound: Uterus measured 8.2 x 4.5 x 3.6 and meters with endometrial stripe of 5 mm. Patient has 2 small fibroids largest 1 measuring 11 x 6 mm intramural. Right ovary normal and left ovarian echo-free thinwall avascular cyst measuring  3.8 x 3.4 x 3.0 cm was noted no free fluid in the cul-de-sac. The cervix was cleansed with Betadine solution a sterile catheter was introduced into the uterine cavity and a small polyp measured 11 x 5 mm was still present.  Ultrasound today: Uterus measured 9.2 x 5.1 x 3.7 cm with endometrial stripe at 2.5 mm. For coronary measuring 10 x 9 mm was noted. Right ovary thinwall echo-free cyst measuring 3.6 x 1.5 x 1.9 cm left ovary with a follicle 2.3 x 1.7 cm previous larger cyst not seen. Afterwards as a result cleansed with Betadine solution a sterile catheter was introduced into the uterine cavity and normal saline was instilled. An intrauterine defect measuring 13 x 7 x 12 mm anterior wall consistent with endometrial polyp was still present.   Pertinent Gynecological History: Menses: regular but heavy Bleeding: see above Contraception: condoms DES exposure: unknown Blood transfusions: none Sexually transmitted diseases: no past history Previous GYN Procedures: nsvs  Last mammogram: normal Date: 2015 Last pap: normal Date: 2015 OB History: G2, P2   Menstrual History: Menarche age: 60  Patient's last menstrual period was 08/23/2014.    Past Medical History  Diagnosis Date  . Depression   . Vaginal delivery     ONE NSVD  . Elective abortion     ONE  . Asthma     ALLERGY INDUCED  . Diabetes mellitus     TYPE 2  . Anxiety   . Hypertension   . Degenerative disc disease   . Degenerative lumbar disc   . GERD (gastroesophageal reflux disease)     takes prilosec     Past Surgical History  Procedure Laterality Date  . Cholecystectomy  2001  . Lapband  04/2012  . Wisdom tooth extraction      Family History  Problem Relation Age of Onset  . Hypertension Father   . Diabetes Father   . Asthma Maternal Grandmother     Social History:  reports that she has never smoked. She has never used smokeless tobacco. She reports that she drinks alcohol. She reports that she does not use  illicit drugs.  Allergies:  Allergies  Allergen Reactions  . Penicillins     REACTION: GI UPSET  . Sulfonamide Derivatives     REACTION: rash     (Not in a hospital admission)  REVIEW OF SYSTEMS: A ROS was performed and pertinent positives and negatives are included in the history.  GENERAL: No fevers or chills. HEENT: No change in vision, no earache, sore throat or sinus congestion. NECK: No pain or stiffness. CARDIOVASCULAR: No chest pain or pressure. No palpitations. PULMONARY: No shortness of breath, cough or wheeze. GASTROINTESTINAL: No abdominal pain, nausea, vomiting or diarrhea, melena or bright red blood per rectum. GENITOURINARY: No urinary frequency, urgency, hesitancy or dysuria. MUSCULOSKELETAL: No joint or muscle pain, no back pain, no recent trauma. DERMATOLOGIC: No rash, no itching, no lesions. ENDOCRINE: No polyuria, polydipsia, no heat or cold intolerance. No recent change in weight. HEMATOLOGICAL: No anemia or easy bruising or bleeding. NEUROLOGIC: No headache, seizures, numbness, tingling or weakness. PSYCHIATRIC: No depression, no loss of interest in normal activity or change in sleep pattern.     Blood pressure 138/78, height 5\' 6"  (1.676 m), weight 248 lb (112.492 kg), last menstrual period 08/23/2014.  Physical Exam:  HEENT:unremarkable Neck:Supple, midline, no thyroid megaly, no carotid bruits Lungs:  Clear to auscultation no rhonchi's or wheezes Heart:Regular rate and rhythm, no murmurs or gallops Breast Exam: Symmetrical in appearance no palpable mass or tenderness no supraclavicular axillary lymphadenopathy Abdomen: Pendulous, soft nontender no rebound or guarding Pelvic:BUS within normal limits Vagina: No lesions or discharge Cervix: No lesions or discharge Uterus: Anteverted Adnexa: No palpable masses or tenderness Extremities: No cords, no edema Rectal: Not done  Results for orders placed or performed during the hospital encounter of 09/06/14 (from  the past 24 hour(s))  Basic metabolic panel     Status: Abnormal   Collection Time: 09/06/14  9:50 AM  Result Value Ref Range   Sodium 139 135 - 145 mmol/L   Potassium 4.5 3.5 - 5.1 mmol/L   Chloride 106 101 - 111 mmol/L   CO2 28 22 - 32 mmol/L   Glucose, Bld 115 (H) 65 - 99 mg/dL   BUN 14 6 - 20 mg/dL   Creatinine, Ser 1.300.84 0.44 - 1.00 mg/dL   Calcium 8.7 (L) 8.9 - 10.3 mg/dL   GFR calc non Af Amer >60 >60 mL/min   GFR calc Af Amer >60 >60 mL/min   Anion gap 5 5 - 15  CBC     Status: None   Collection Time: 09/06/14  9:50 AM  Result Value Ref Range   WBC 7.7 4.0 - 10.5 K/uL   RBC 4.31 3.87 - 5.11 MIL/uL   Hemoglobin 12.3 12.0 - 15.0 g/dL   HCT 86.537.4 78.436.0 - 69.646.0 %   MCV 86.8 78.0 - 100.0 fL   MCH 28.5 26.0 - 34.0 pg   MCHC 32.9 30.0 - 36.0 g/dL   RDW 29.512.9 28.411.5 - 13.215.5 %   Platelets 222 150 - 400 K/uL    No results found.  Assessment/Plan: With persistent endometrial polyp and menorrhagia. Patient scheduled to undergo resectoscopic polypectomy and placement of Mirena IUD for contraception as well as for cycle control.  The following surgical risk were discussed:                        Patient was counseled as to the risk of surgery to include the following:  1. Infection (prohylactic antibiotics will be administered)  2. DVT/Pulmonary Embolism (prophylactic pneumo compression stockings will be used)  3.Trauma to internal organs requiring additional surgical procedure to repair any injury to     Internal organs requiring perhaps additional hospitalization days.  4.Hemmorhage requiring transfusion and blood products which carry risks such as             anaphylactic reaction, hepatitis and AIDS  Patient had received literature information on the procedure scheduled and all her questions were answered and fully accepts all risk.   [facility] [id number] PMTD@Note :    Ok Edwards 09/06/2014, 12:01 PM

## 2014-09-06 NOTE — Patient Instructions (Signed)
Levonorgestrel intrauterine device (IUD) What is this medicine? LEVONORGESTREL IUD (LEE voe nor jes trel) is a contraceptive (birth control) device. The device is placed inside the uterus by a healthcare professional. It is used to prevent pregnancy and can also be used to treat heavy bleeding that occurs during your period. Depending on the device, it can be used for 3 to 5 years. This medicine may be used for other purposes; ask your health care provider or pharmacist if you have questions. COMMON BRAND NAME(S): LILETTA, Mirena, Skyla What should I tell my health care provider before I take this medicine? They need to know if you have any of these conditions: -abnormal Pap smear -cancer of the breast, uterus, or cervix -diabetes -endometritis -genital or pelvic infection now or in the past -have more than one sexual partner or your partner has more than one partner -heart disease -history of an ectopic or tubal pregnancy -immune system problems -IUD in place -liver disease or tumor -problems with blood clots or take blood-thinners -use intravenous drugs -uterus of unusual shape -vaginal bleeding that has not been explained -an unusual or allergic reaction to levonorgestrel, other hormones, silicone, or polyethylene, medicines, foods, dyes, or preservatives -pregnant or trying to get pregnant -breast-feeding How should I use this medicine? This device is placed inside the uterus by a health care professional. Talk to your pediatrician regarding the use of this medicine in children. Special care may be needed. Overdosage: If you think you have taken too much of this medicine contact a poison control center or emergency room at once. NOTE: This medicine is only for you. Do not share this medicine with others. What if I miss a dose? This does not apply. What may interact with this medicine? Do not take this medicine with any of the following  medications: -amprenavir -bosentan -fosamprenavir This medicine may also interact with the following medications: -aprepitant -barbiturate medicines for inducing sleep or treating seizures -bexarotene -griseofulvin -medicines to treat seizures like carbamazepine, ethotoin, felbamate, oxcarbazepine, phenytoin, topiramate -modafinil -pioglitazone -rifabutin -rifampin -rifapentine -some medicines to treat HIV infection like atazanavir, indinavir, lopinavir, nelfinavir, tipranavir, ritonavir -St. John's wort -warfarin This list may not describe all possible interactions. Give your health care provider a list of all the medicines, herbs, non-prescription drugs, or dietary supplements you use. Also tell them if you smoke, drink alcohol, or use illegal drugs. Some items may interact with your medicine. What should I watch for while using this medicine? Visit your doctor or health care professional for regular check ups. See your doctor if you or your partner has sexual contact with others, becomes HIV positive, or gets a sexual transmitted disease. This product does not protect you against HIV infection (AIDS) or other sexually transmitted diseases. You can check the placement of the IUD yourself by reaching up to the top of your vagina with clean fingers to feel the threads. Do not pull on the threads. It is a good habit to check placement after each menstrual period. Call your doctor right away if you feel more of the IUD than just the threads or if you cannot feel the threads at all. The IUD may come out by itself. You may become pregnant if the device comes out. If you notice that the IUD has come out use a backup birth control method like condoms and call your health care provider. Using tampons will not change the position of the IUD and are okay to use during your period. What side effects may   I notice from receiving this medicine? Side effects that you should report to your doctor or  health care professional as soon as possible: -allergic reactions like skin rash, itching or hives, swelling of the face, lips, or tongue -fever, flu-like symptoms -genital sores -high blood pressure -no menstrual period for 6 weeks during use -pain, swelling, warmth in the leg -pelvic pain or tenderness -severe or sudden headache -signs of pregnancy -stomach cramping -sudden shortness of breath -trouble with balance, talking, or walking -unusual vaginal bleeding, discharge -yellowing of the eyes or skin Side effects that usually do not require medical attention (report to your doctor or health care professional if they continue or are bothersome): -acne -breast pain -change in sex drive or performance -changes in weight -cramping, dizziness, or faintness while the device is being inserted -headache -irregular menstrual bleeding within first 3 to 6 months of use -nausea This list may not describe all possible side effects. Call your doctor for medical advice about side effects. You may report side effects to FDA at 1-800-FDA-1088. Where should I keep my medicine? This does not apply. NOTE: This sheet is a summary. It may not cover all possible information. If you have questions about this medicine, talk to your doctor, pharmacist, or health care provider.  2015, Elsevier/Gold Standard. (2011-03-11 13:54:04) Hysteroscopy Hysteroscopy is a procedure used for looking inside the womb (uterus). It may be done for various reasons, including:  To evaluate abnormal bleeding, fibroid (benign, noncancerous) tumors, polyps, scar tissue (adhesions), and possibly cancer of the uterus.  To look for lumps (tumors) and other uterine growths.  To look for causes of why a woman cannot get pregnant (infertility), causes of recurrent loss of pregnancy (miscarriages), or a lost intrauterine device (IUD).  To perform a sterilization by blocking the fallopian tubes from inside the uterus. In this  procedure, a thin, flexible tube with a tiny light and camera on the end of it (hysteroscope) is used to look inside the uterus. A hysteroscopy should be done right after a menstrual period to be sure you are not pregnant. LET [facility] CARE PROVIDER KNOW ABOUT:   Any allergies you have.  All medicines you are taking, including vitamins, herbs, eye drops, creams, and over-the-counter medicines.  Previous problems you or members of your family have had with the use of anesthetics.  Any blood disorders you have.  Previous surgeries you have had.  Medical conditions you have. RISKS AND COMPLICATIONS  Generally, this is a safe procedure. However, as with any procedure, complications can occur. Possible complications include:  Putting a hole in the uterus.  Excessive bleeding.  Infection.  Damage to the cervix.  Injury to other organs.  Allergic reaction to medicines.  Too much fluid used in the uterus for the procedure. BEFORE THE PROCEDURE   Ask your health care provider about changing or stopping any regular medicines.  Do not take aspirin or blood thinners for 1 week before the procedure, or as directed by your health care provider. These can cause bleeding.  If you smoke, do not smoke for 2 weeks before the procedure.  In some cases, a medicine is placed in the cervix the day before the procedure. This medicine makes the cervix have a larger opening (dilate). This makes it easier for the instrument to be inserted into the uterus during the procedure.  Do not eat or drink anything for at least 8 hours before the surgery.  Arrange for someone to take you home after the  procedure. PROCEDURE   You may be given a medicine to relax you (sedative). You may also be given one of the following:  A medicine that numbs the area around the cervix (local anesthetic).  A medicine that makes you sleep through the procedure (general anesthetic).  The hysteroscope is inserted  through the vagina into the uterus. The camera on the hysteroscope sends a picture to a TV screen. This gives the surgeon a good view inside the uterus.  During the procedure, air or a liquid is put into the uterus, which allows the surgeon to see better.  Sometimes, tissue is gently scraped from inside the uterus. These tissue samples are sent to a lab for testing. AFTER THE PROCEDURE   If you had a general anesthetic, you may be groggy for a couple hours after the procedure.  If you had a local anesthetic, you will be able to go home as soon as you are stable and feel ready.  You may have some cramping. This normally lasts for a couple days.  You may have bleeding, which varies from light spotting for a few days to menstrual-like bleeding for 3-7 days. This is normal.  If your test results are not back during the visit, make an appointment with your health care provider to find out the results. Document Released: 05/17/2000 Document Revised: 11/29/2012 Document Reviewed: 09/07/2012 [facility] Patient Information 2015 [place]. This information is not intended to replace advice given to you by your health care provider. Make sure you discuss any questions you have with your health care provider.

## 2014-09-06 NOTE — Patient Instructions (Addendum)
Your procedure is scheduled on: September 10, 2014    Enter through [facility]tr[facility] at: 6:00 am   Pick up the phone at the desk and dial [phone].  Call this number if you have problems the morning of surgery: [phone].  Remember: Do NOT eat food: after midnight on Monday night  Do NOT drink clear liquids after:  Midnight on Monday night  Take these medicines the morning of surgery with a SIP OF WATER:  Wellbutrin, prilosec, ativan if needed,  and aldactone  Bring inhaler with you on day of surgery and hold Metformin for 24 hours prior to surgery   Do NOT wear jewelry (body piercing), metal hair clips/bobby pins, make-up, or nail polish. Do NOT wear lotions, powders, or perfumes.  You may wear deoderant. Do NOT shave for 48 hours prior to surgery. Do NOT bring valuables to the hospital. Contacts, dentures, or bridgework may not be worn into surgery. Have a responsible adult drive you home and stay with you for 24 hours after your procedure

## 2014-09-09 ENCOUNTER — Telehealth: Payer: Self-pay

## 2014-09-09 MED ORDER — OXYCODONE-ACETAMINOPHEN 5-325 MG PO TABS: ORAL_TABLET | ORAL | Status: DC

## 2014-09-09 NOTE — Telephone Encounter (Signed)
Patient is scheduled for D&C, Hyst and Mirena IUD in the morning. She said at pre-op consult you told her she would not need Rx for pain. Could take Ibuprofen.  She said that makes her very nervous. She said she does not tolerate pain well. Her concern is if she needs something for pain and [place] and would have to ride to [place] to pick it up. Also since she is on pain meds for her back pain [facility] told her she would need to bring the written Rx to them first and they will approve it considering the other pain meds she takes, interactions, etc. Then she could get it filled.   She is concerned that she will have post op pain and need something. She said at least if you could go ahead and write her something and let her pick if up today and have it on hand just in case she needs it that would make her feel less anxious about this.

## 2014-09-09 NOTE — Telephone Encounter (Signed)
Dr. Glenetta HewJF provided me with hand written Rx for the patient. I placed med order in system with "no print" so that he can sign it in system. Patient notified she can pick it up at front desk.

## 2014-09-10 ENCOUNTER — Encounter (HOSPITAL_COMMUNITY): Payer: Self-pay | Admitting: Anesthesiology

## 2014-09-10 ENCOUNTER — Encounter (HOSPITAL_COMMUNITY)
Admission: RE | Disposition: A | Discharge status: Home / Self Care | Payer: Self-pay | Source: Ambulatory Visit | Attending: Gynecology

## 2014-09-10 ENCOUNTER — Ambulatory Visit (HOSPITAL_COMMUNITY)
Admission: RE | Admit: 2014-09-10 | Discharge: 2014-09-10 | Disposition: A | Discharge status: Home / Self Care | Payer: PRIVATE HEALTH INSURANCE | Source: Ambulatory Visit | Attending: Gynecology | Admitting: Gynecology

## 2014-09-10 ENCOUNTER — Ambulatory Visit (HOSPITAL_COMMUNITY): Payer: PRIVATE HEALTH INSURANCE | Admitting: Anesthesiology

## 2014-09-10 DIAGNOSIS — I10 Essential (primary) hypertension: Secondary | ICD-10-CM | POA: Diagnosis not present

## 2014-09-10 DIAGNOSIS — J45909 Unspecified asthma, uncomplicated: Secondary | ICD-10-CM | POA: Diagnosis not present

## 2014-09-10 DIAGNOSIS — E119 Type 2 diabetes mellitus without complications: Secondary | ICD-10-CM | POA: Diagnosis not present

## 2014-09-10 DIAGNOSIS — K219 Gastro-esophageal reflux disease without esophagitis: Secondary | ICD-10-CM | POA: Diagnosis not present

## 2014-09-10 DIAGNOSIS — M1990 Unspecified osteoarthritis, unspecified site: Secondary | ICD-10-CM | POA: Insufficient documentation

## 2014-09-10 DIAGNOSIS — N938 Other specified abnormal uterine and vaginal bleeding: Secondary | ICD-10-CM | POA: Diagnosis not present

## 2014-09-10 DIAGNOSIS — N840 Polyp of corpus uteri: Secondary | ICD-10-CM | POA: Insufficient documentation

## 2014-09-10 DIAGNOSIS — E282 Polycystic ovarian syndrome: Secondary | ICD-10-CM | POA: Diagnosis not present

## 2014-09-10 DIAGNOSIS — Z30430 Encounter for insertion of intrauterine contraceptive device: Secondary | ICD-10-CM | POA: Diagnosis not present

## 2014-09-10 HISTORY — PX: DILATATION & CURETTAGE/HYSTEROSCOPY WITH MYOSURE: SHX6511

## 2014-09-10 LAB — PREGNANCY, URINE: Preg Test, Ur: NEGATIVE

## 2014-09-10 LAB — GLUCOSE, CAPILLARY
GLUCOSE-CAPILLARY: 101 mg/dL — AB (ref 65–99)
GLUCOSE-CAPILLARY: 126 mg/dL — AB (ref 65–99)

## 2014-09-10 SURGERY — DILATATION & CURETTAGE/HYSTEROSCOPY WITH MYOSURE
Anesthesia: General | Site: Vagina

## 2014-09-10 MED ORDER — LACTATED RINGERS IV SOLN
INTRAVENOUS | Status: DC
  Administered 2014-09-10 (×2): via INTRAVENOUS

## 2014-09-10 MED ORDER — HYDROCODONE-ACETAMINOPHEN 7.5-325 MG PO TABS
ORAL_TABLET | ORAL | Status: AC
  Administered 2014-09-10: 1 via ORAL
  Filled 2014-09-10: qty 1

## 2014-09-10 MED ORDER — SODIUM CHLORIDE 0.9 % IJ SOLN
INTRAMUSCULAR | Status: DC | PRN
  Administered 2014-09-10: 30 mL via INTRAVENOUS

## 2014-09-10 MED ORDER — SILVER NITRATE-POT NITRATE 75-25 % EX MISC
CUTANEOUS | Status: DC | PRN
  Administered 2014-09-10: 2

## 2014-09-10 MED ORDER — FENTANYL CITRATE (PF) 100 MCG/2ML IJ SOLN
INTRAMUSCULAR | Status: DC | PRN
  Administered 2014-09-10: 100 ug via INTRAVENOUS

## 2014-09-10 MED ORDER — ONDANSETRON HCL 4 MG/2ML IJ SOLN
INTRAMUSCULAR | Status: DC | PRN
  Administered 2014-09-10: 4 mg via INTRAVENOUS

## 2014-09-10 MED ORDER — GENTAMICIN SULFATE 40 MG/ML IJ SOLN
INTRAVENOUS | Status: AC
  Administered 2014-09-10: 115 mL via INTRAVENOUS
  Filled 2014-09-10: qty 9.5

## 2014-09-10 MED ORDER — VASOPRESSIN 20 UNIT/ML IV SOLN
INTRAVENOUS | Status: DC | PRN
  Administered 2014-09-10: 20 [IU]

## 2014-09-10 MED ORDER — KETOROLAC TROMETHAMINE 30 MG/ML IJ SOLN
INTRAMUSCULAR | Status: DC | PRN
  Administered 2014-09-10: 30 mg via INTRAVENOUS

## 2014-09-10 MED ORDER — MIDAZOLAM HCL 2 MG/2ML IJ SOLN
INTRAMUSCULAR | Status: AC
  Filled 2014-09-10: qty 2

## 2014-09-10 MED ORDER — SODIUM CHLORIDE 0.9 % IJ SOLN
INTRAMUSCULAR | Status: AC
  Filled 2014-09-10: qty 50

## 2014-09-10 MED ORDER — SODIUM CHLORIDE 0.9 % IJ SOLN
INTRAMUSCULAR | Status: AC
  Filled 2014-09-10: qty 10

## 2014-09-10 MED ORDER — FENTANYL CITRATE (PF) 250 MCG/5ML IJ SOLN
INTRAMUSCULAR | Status: AC
  Filled 2014-09-10: qty 5

## 2014-09-10 MED ORDER — SCOPOLAMINE 1 MG/3DAYS TD PT72
1.0000 | MEDICATED_PATCH | Freq: Once | TRANSDERMAL | Status: DC
  Administered 2014-09-10: 1.5 mg via TRANSDERMAL

## 2014-09-10 MED ORDER — ONDANSETRON HCL 4 MG/2ML IJ SOLN
INTRAMUSCULAR | Status: AC
  Filled 2014-09-10: qty 2

## 2014-09-10 MED ORDER — METOCLOPRAMIDE HCL 5 MG/ML IJ SOLN: 10.0000 mg | Freq: Once | INTRAMUSCULAR | Status: DC | PRN

## 2014-09-10 MED ORDER — SODIUM CHLORIDE 0.9 % IR SOLN
Status: DC | PRN
  Administered 2014-09-10: 3000 mL

## 2014-09-10 MED ORDER — LIDOCAINE HCL (CARDIAC) 20 MG/ML IV SOLN
INTRAVENOUS | Status: AC
Start: 2014-09-10 — End: 2014-09-10
  Filled 2014-09-10: qty 5

## 2014-09-10 MED ORDER — MEPERIDINE HCL 25 MG/ML IJ SOLN: 6.2500 mg | INTRAMUSCULAR | Status: DC | PRN

## 2014-09-10 MED ORDER — PROPOFOL 10 MG/ML IV BOLUS
INTRAVENOUS | Status: DC | PRN
  Administered 2014-09-10: 180 mg via INTRAVENOUS

## 2014-09-10 MED ORDER — LIDOCAINE HCL (CARDIAC) 20 MG/ML IV SOLN
INTRAVENOUS | Status: DC | PRN
  Administered 2014-09-10: 50 mg via INTRAVENOUS

## 2014-09-10 MED ORDER — FENTANYL CITRATE (PF) 100 MCG/2ML IJ SOLN: 25.0000 ug | INTRAMUSCULAR | Status: DC | PRN

## 2014-09-10 MED ORDER — SCOPOLAMINE 1 MG/3DAYS TD PT72
MEDICATED_PATCH | TRANSDERMAL | Status: AC
  Filled 2014-09-10: qty 1

## 2014-09-10 MED ORDER — VASOPRESSIN 20 UNIT/ML IV SOLN
INTRAVENOUS | Status: AC
  Filled 2014-09-10: qty 1

## 2014-09-10 MED ORDER — PROPOFOL 10 MG/ML IV BOLUS
INTRAVENOUS | Status: AC
  Filled 2014-09-10: qty 20

## 2014-09-10 MED ORDER — HYDROCODONE-ACETAMINOPHEN 7.5-325 MG PO TABS
1.0000 | ORAL_TABLET | Freq: Once | ORAL | Status: AC | PRN
  Administered 2014-09-10: 1 via ORAL

## 2014-09-10 MED ORDER — MIDAZOLAM HCL 2 MG/2ML IJ SOLN
INTRAMUSCULAR | Status: DC | PRN
  Administered 2014-09-10: 2 mg via INTRAVENOUS

## 2014-09-10 MED ORDER — KETOROLAC TROMETHAMINE 30 MG/ML IJ SOLN
INTRAMUSCULAR | Status: AC
  Filled 2014-09-10: qty 1

## 2014-09-10 SURGICAL SUPPLY — 28 items
CANISTERS HI-FLOW 3000CC (CANNISTER) ×2 IMPLANT
CATH FOLEY 2WAY SLVR 30CC 16FR (CATHETERS) ×2 IMPLANT
CATH ROBINSON RED A/P 16FR (CATHETERS) ×2 IMPLANT
CLOTH BEACON ORANGE TIMEOUT ST (SAFETY) ×2 IMPLANT
CONTAINER PREFILL 10% NBF 60ML (FORM) ×3 IMPLANT
CORD ACTIVE DISPOSABLE (ELECTRODE)
CORD ELECTRO ACTIVE DISP (ELECTRODE) IMPLANT
DEVICE MYOSURE CLASSIC (MISCELLANEOUS) IMPLANT
DEVICE MYOSURE LITE (MISCELLANEOUS) ×1 IMPLANT
ELECT VAPORTRODE GRVD BAR (ELECTRODE) IMPLANT
FILTER ARTHROSCOPY CONVERTOR (FILTER) ×2 IMPLANT
GLOVE BIOGEL PI IND STRL 8 (GLOVE) ×1 IMPLANT
GLOVE BIOGEL PI INDICATOR 8 (GLOVE) ×1
GLOVE ECLIPSE 7.5 STRL STRAW (GLOVE) ×4 IMPLANT
GOWN STRL REUS W/TWL LRG LVL3 (GOWN DISPOSABLE) ×4 IMPLANT
PACK VAGINAL MINOR WOMEN LF (CUSTOM PROCEDURE TRAY) ×2 IMPLANT
PAD OB MATERNITY 4.3X12.25 (PERSONAL CARE ITEMS) ×2 IMPLANT
PAD PREP 24X48 CUFFED NSTRL (MISCELLANEOUS) ×2 IMPLANT
PLUG CATH AND CAP STER (CATHETERS) IMPLANT
SEAL ROD LENS SCOPE MYOSURE (ABLATOR) ×2 IMPLANT
SET BERKELEY SUCTION TUBING (SUCTIONS) IMPLANT
SYR 30ML LL (SYRINGE) IMPLANT
TOWEL OR 17X24 6PK STRL BLUE (TOWEL DISPOSABLE) ×4 IMPLANT
TUBING AQUILEX INFLOW (TUBING) ×2 IMPLANT
TUBING AQUILEX OUTFLOW (TUBING) ×2 IMPLANT
VACURETTE 8 RIGID CVD (CANNULA) IMPLANT
VACURETTE 9 RIGID CVD (CANNULA) IMPLANT
WATER STERILE IRR 1000ML POUR (IV SOLUTION) ×2 IMPLANT

## 2014-09-10 NOTE — Anesthesia Postprocedure Evaluation (Signed)
Anesthesia Post Note  Patient: Debra Myers  Procedure(s) Performed: Procedure(s) (LRB): DILATATION & CURETTAGE/HYSTEROSCOPY WITH MYOSURE/INSERTION OF IUD (N/A)  Anesthesia type: General  Patient location: PACU  Post pain: Pain level controlled  Post assessment: Post-op Vital signs reviewed  Last Vitals:  Filed Vitals:   09/10/14 0900  BP: 127/72  Pulse: 98  Temp: 36.6 C  Resp: 18    Post vital signs: Reviewed  Level of consciousness: sedated  Complications: No apparent anesthesia complications

## 2014-09-10 NOTE — H&P (View-Only) (Signed)
Debra Myers is an 49 y.o. female. For preoperative exam. Patient scheduled for resectoscopic polypectomy as a result of menorrhagia and also for placement of Mirena IUD next week. Her history is as follows:  Patient was last seen in the office on 08/02/2014. She had a sonohysterogram for follow-up on a questionable endometrial polyp on last study. Also to follow-up in the left ovarian cyst after she had been given Depo-Provera 150 mg IM. Patient currently not sexually active has not been in 6 months. She is not using any form of contraception. Her history is as follows:  Patient was seen in the office 08/03/2013. Patient has been on her oral contraceptive pill and has been compliant.  June of this year patient had an ultrasound/sonohysterogram as follows: Ultrasound today: Uterus measures 7.6 x 4.1 x 3.2 cm with endometrial stripe of 3.7 mm. Normal uterus and ovaries. No free fluid seen in the cul-de-sac. The cervix was cleansed with Betadine solution. A sterile catheter was introduced into the uterine cavity and sterile normal saline was instilled into the uterine cavity and there was a questionable endometrial polyp measured 6 x 5 mm  She had an endometrial biopsy on the same day with a final result   Diagnosis Endometrium, biopsy - PROLIFERATIVE-TYPE ENDOMETRIUM WITH BREAKDOWN. - THERE IS NO EVIDENCE OF HYPERPLASIA OR MALIGNANCY. - SEE COMMENT. Microscopic Comment Sections show fragments of benign proliferative-type endometrium in which there are foci of breakdown with fragmented glands and condensed stroma. The features suggest anovulatory or estrogen withdrawal Bleeding.  Ultrasound was then repeated on 03/29/2014 which demonstrated the following: Ultrasound: Uterus measured 8.2 x 4.5 x 3.6 and meters with endometrial stripe of 5 mm. Patient has 2 small fibroids largest 1 measuring 11 x 6 mm intramural. Right ovary normal and left ovarian echo-free thinwall avascular cyst measuring  3.8 x 3.4 x 3.0 cm was noted no free fluid in the cul-de-sac. The cervix was cleansed with Betadine solution a sterile catheter was introduced into the uterine cavity and a small polyp measured 11 x 5 mm was still present.  Ultrasound today: Uterus measured 9.2 x 5.1 x 3.7 cm with endometrial stripe at 2.5 mm. For coronary measuring 10 x 9 mm was noted. Right ovary thinwall echo-free cyst measuring 3.6 x 1.5 x 1.9 cm left ovary with a follicle 2.3 x 1.7 cm previous larger cyst not seen. Afterwards as a result cleansed with Betadine solution a sterile catheter was introduced into the uterine cavity and normal saline was instilled. An intrauterine defect measuring 13 x 7 x 12 mm anterior wall consistent with endometrial polyp was still present.   Pertinent Gynecological History: Menses: regular but heavy Bleeding: see above Contraception: condoms DES exposure: unknown Blood transfusions: none Sexually transmitted diseases: no past history Previous GYN Procedures: nsvs  Last mammogram: normal Date: 2015 Last pap: normal Date: 2015 OB History: G2, P2   Menstrual History: Menarche age: 60  Patient's last menstrual period was 08/23/2014.    Past Medical History  Diagnosis Date  . Depression   . Vaginal delivery     ONE NSVD  . Elective abortion     ONE  . Asthma     ALLERGY INDUCED  . Diabetes mellitus     TYPE 2  . Anxiety   . Hypertension   . Degenerative disc disease   . Degenerative lumbar disc   . GERD (gastroesophageal reflux disease)     takes prilosec     Past Surgical History  Procedure Laterality Date  . Cholecystectomy  2001  . Lapband  04/2012  . Wisdom tooth extraction      Family History  Problem Relation Age of Onset  . Hypertension Father   . Diabetes Father   . Asthma Maternal Grandmother     Social History:  reports that she has never smoked. She has never used smokeless tobacco. She reports that she drinks alcohol. She reports that she does not use  illicit drugs.  Allergies:  Allergies  Allergen Reactions  . Penicillins     REACTION: GI UPSET  . Sulfonamide Derivatives     REACTION: rash     (Not in a hospital admission)  REVIEW OF SYSTEMS: A ROS was performed and pertinent positives and negatives are included in the history.  GENERAL: No fevers or chills. HEENT: No change in vision, no earache, sore throat or sinus congestion. NECK: No pain or stiffness. CARDIOVASCULAR: No chest pain or pressure. No palpitations. PULMONARY: No shortness of breath, cough or wheeze. GASTROINTESTINAL: No abdominal pain, nausea, vomiting or diarrhea, melena or bright red blood per rectum. GENITOURINARY: No urinary frequency, urgency, hesitancy or dysuria. MUSCULOSKELETAL: No joint or muscle pain, no back pain, no recent trauma. DERMATOLOGIC: No rash, no itching, no lesions. ENDOCRINE: No polyuria, polydipsia, no heat or cold intolerance. No recent change in weight. HEMATOLOGICAL: No anemia or easy bruising or bleeding. NEUROLOGIC: No headache, seizures, numbness, tingling or weakness. PSYCHIATRIC: No depression, no loss of interest in normal activity or change in sleep pattern.     Blood pressure 138/78, height 5\' 6"  (1.676 m), weight 248 lb (112.492 kg), last menstrual period 08/23/2014.  Physical Exam:  HEENT:unremarkable Neck:Supple, midline, no thyroid megaly, no carotid bruits Lungs:  Clear to auscultation no rhonchi's or wheezes Heart:Regular rate and rhythm, no murmurs or gallops Breast Exam: Symmetrical in appearance no palpable mass or tenderness no supraclavicular axillary lymphadenopathy Abdomen: Pendulous, soft nontender no rebound or guarding Pelvic:BUS within normal limits Vagina: No lesions or discharge Cervix: No lesions or discharge Uterus: Anteverted Adnexa: No palpable masses or tenderness Extremities: No cords, no edema Rectal: Not done  Results for orders placed or performed during the hospital encounter of 09/06/14 (from  the past 24 hour(s))  Basic metabolic panel     Status: Abnormal   Collection Time: 09/06/14  9:50 AM  Result Value Ref Range   Sodium 139 135 - 145 mmol/L   Potassium 4.5 3.5 - 5.1 mmol/L   Chloride 106 101 - 111 mmol/L   CO2 28 22 - 32 mmol/L   Glucose, Bld 115 (H) 65 - 99 mg/dL   BUN 14 6 - 20 mg/dL   Creatinine, Ser 1.300.84 0.44 - 1.00 mg/dL   Calcium 8.7 (L) 8.9 - 10.3 mg/dL   GFR calc non Af Amer >60 >60 mL/min   GFR calc Af Amer >60 >60 mL/min   Anion gap 5 5 - 15  CBC     Status: None   Collection Time: 09/06/14  9:50 AM  Result Value Ref Range   WBC 7.7 4.0 - 10.5 K/uL   RBC 4.31 3.87 - 5.11 MIL/uL   Hemoglobin 12.3 12.0 - 15.0 g/dL   HCT 86.537.4 78.436.0 - 69.646.0 %   MCV 86.8 78.0 - 100.0 fL   MCH 28.5 26.0 - 34.0 pg   MCHC 32.9 30.0 - 36.0 g/dL   RDW 29.512.9 28.411.5 - 13.215.5 %   Platelets 222 150 - 400 K/uL    No results found.  Assessment/Plan: With persistent endometrial polyp and menorrhagia. Patient scheduled to undergo resectoscopic polypectomy and placement of Mirena IUD for contraception as well as for cycle control.  The following surgical risk were discussed:                        Patient was counseled as to the risk of surgery to include the following:  1. Infection (prohylactic antibiotics will be administered)  2. DVT/Pulmonary Embolism (prophylactic pneumo compression stockings will be used)  3.Trauma to internal organs requiring additional surgical procedure to repair any injury to     Internal organs requiring perhaps additional hospitalization days.  4.Hemmorhage requiring transfusion and blood products which carry risks such as             anaphylactic reaction, hepatitis and AIDS  Patient had received literature information on the procedure scheduled and all her questions were answered and fully accepts all risk.   [facility] HMD12:50 PMTD@Note :    Ok Edwards 09/06/2014, 12:01 PM

## 2014-09-10 NOTE — Discharge Instructions (Signed)
DISCHARGE INSTRUCTIONS: HYSTEROSCOPY / ENDOMETRIAL ABLATION The following instructions have been prepared to help you care for yourself upon your return home.  May Remove Scop patch on or before Friday 09/13/14.  May take Ibuprofen after 2:00 pm today.  May take stool softner while taking narcotic pain medication to prevent constipation.  Drink plenty of water.  Personal hygiene:  Use sanitary pads for vaginal drainage, not tampons.  Shower the day after your procedure.  NO tub baths, pools or Jacuzzis for 2-3 weeks.  Wipe front to back after using the bathroom.  Activity and limitations:  Do NOT rest in bed all day.  Walking is encouraged.  Walk up and down stairs slowly.  You may resume your normal activity in one to two days or as indicated by your physician. Sexual activity: NO intercourse for at least 2 weeks after the procedure, or as indicated by your Doctor.  Diet: Eat a light meal as desired this evening. You may resume your usual diet tomorrow.  Return to Work: You may resume your work activities in one to two days or as indicated by Therapist, sportsyour Doctor.  What to expect after your surgery: Expect to have vaginal bleeding/discharge for 2-3 days and spotting for up to 10 days. It is not unusual to have soreness for up to 1-2 weeks. You may have a slight burning sensation when you urinate for the first day. Mild cramps may continue for a couple of days. You may have a regular period in 2-6 weeks.  Call your doctor for any of the following:  Excessive vaginal bleeding or clotting, saturating and changing one pad every hour.  Inability to urinate 6 hours after discharge from hospital.  Pain not relieved by pain medication.  Fever of 100.4 F or greater.  Unusual vaginal discharge or odor.  Return to office _________________Call for an appointment ___________________ Patients signature: ______________________ Nurses signature ________________________  Post  Anesthesia Care Unit [phone]

## 2014-09-10 NOTE — Interval H&P Note (Signed)
History and Physical Interval Note:  09/10/2014 7:19 AM  Debra Myers  has presented today for surgery, with the diagnosis of ENDOMETRIAL POLYP, PLACEMENT OF IUD  The various methods of treatment have been discussed with the patient and family. After consideration of risks, benefits and other options for treatment, the patient has consented to  Procedure(s): DILATATION & CURETTAGE/HYSTEROSCOPY WITH MYOSURE/INSERTION OF IUD (N/A) as a surgical intervention .  The patient's history has been reviewed, patient examined, no change in status, stable for surgery.  I have reviewed the patient's chart and labs.  Questions were answered to the patient's satisfaction.     Ok EdwardsFERNANDEZ,Treyvin Glidden H

## 2014-09-10 NOTE — Anesthesia Preprocedure Evaluation (Addendum)
Anesthesia Evaluation  Patient identified by MRN, date of birth, ID band Patient awake    Reviewed: Allergy & Precautions, NPO status , Patient's Chart, lab work & pertinent test results  Airway Mallampati: I  TM Distance: >3 FB Neck ROM: Full    Dental no notable dental hx. (+) Teeth Intact   Pulmonary asthma ,  Snores breath sounds clear to auscultation  Pulmonary exam normal       Cardiovascular hypertension, Pt. on medications Normal cardiovascular examRhythm:Regular Rate:Normal     Neuro/Psych PSYCHIATRIC DISORDERS Anxiety Depression negative neurological ROS     GI/Hepatic Neg liver ROS, GERD-  Medicated and Controlled,  Endo/Other  diabetes, Well Controlled, Type 2, Oral Hypoglycemic AgentsMorbid obesityPCOS Hyperlipidemia  Renal/GU negative Renal ROS  negative genitourinary   Musculoskeletal  (+) Arthritis -, Osteoarthritis,  DDD lumbar spine   Abdominal (+) + obese,   Peds  Hematology   Anesthesia Other Findings   Reproductive/Obstetrics Endometrial polyp                            Anesthesia Physical Anesthesia Plan  ASA: III  Anesthesia Plan: General   Post-op Pain Management:    Induction: Intravenous  Airway Management Planned: LMA  Additional Equipment:   Intra-op Plan:   Post-operative Plan: Extubation in OR  Informed Consent: I have reviewed the patients History and Physical, chart, labs and discussed the procedure including the risks, benefits and alternatives for the proposed anesthesia with the patient or authorized representative who has indicated his/her understanding and acceptance.   Dental advisory given  Plan Discussed with: Anesthesiologist, CRNA and Surgeon  Anesthesia Plan Comments:         Anesthesia Quick Evaluation

## 2014-09-10 NOTE — Op Note (Signed)
   Operative Note  09/10/2014  8:24 AM  PATIENT:  Debra Myers  49 y.o. female  PRE-OPERATIVE DIAGNOSIS:  ENDOMETRIAL POLYP, PLACEMENT OF IUD  POST-OPERATIVE DIAGNOSIS:  ENDOMETRIAL POLYP, PLACEMENT OF IUD  PROCEDURE:  Procedure(s): DILATATION & CURETTAGE/HYSTEROSCOPY WITH MYOSURE/INSERTION OF IUD  SURGEON:  Surgeon(s): Ok EdwardsJuan H Amry Cathy, MD  ANESTHESIA:   general, Pitressin was infiltrated into the cervical vaginal stroma at the 2, 4, 8, and 10:00 position. (Pitressin 10 units in 30 cc of normal saline) total 10 cc  FINDINGS: Large anterior uterine polyp  DESCRIPTION OF OPERATION:FINDINGS: The patient was taken to the operating room where she underwent a successful general endotracheal anesthesia. Patient had PAS stockings for DVT prophylaxis. She received gentamicin and clindamycin IV because of penicillin allergy for prophylaxis.. Time out was undertaken to properly identify the patient and the proper operation schedule. The vagina and perineum were prepped and draped in usual sterile fashion. A red rubber Roxan HockeyRobinson was inserted to empty the bladder is content for approximately 50 cc. Bimanual examination demonstrated an anteverted uterus. Patient's legs were in the high lithotomy position. A weighted speculum was placed in the posterior vaginal vault. A single-tooth tenaculum was placed on the anterior cervical lip. Pitressin was infiltrated at the cervical vaginal stroma at the 2, 4, 8, and 10:00 position for 10 cc. (Pitressin 10 units in 30 cc of normal saline total used 10 cc) The uterus sounded to 7-1/2 cm. Pratt dilator were used to dilate the cervical canal to a 15 mm size. The Hologic Myosure resectoscopic morcellator with a scope of 6.25 mm and an operating blade of 3.0 mm was introduced into the intrauterine cavity. 0.9% normal saline was the distending media. Inspection of the endometrial cavity demonstrated a large anterior uterine polyp. With the resectoscopic morcellator it was  removed and submitted for histological evaluation. Fluid deficit was approximately 75 cc's. Since we had previously measured the uterus and it sounded to 7-1/2 cm the Mirena IUD was introduced and the string was cut and was placed without any difficulty. The single-tooth tenaculum was removed patient was extubated transferred to recovery room stable vital signs blood loss was minimal. She received 30 mg of Toradol in route to the recovery room.    ESTIMATED BLOOD LOSS: Minimal   Intake/Output Summary (Last 24 hours) at 09/10/14 0824 Last data filed at 09/10/14 0800  Gross per 24 hour  Intake   1500 ml  Output     80 ml  Net   1420 ml     BLOOD ADMINISTERED:none   LOCAL MEDICATIONS USED:  OTHER see above  SPECIMEN:  Source of Specimen:  Endometrial polyp  DISPOSITION OF SPECIMEN:  PATHOLOGY  COUNTS:  YES  PLAN OF CARE: Transfer to PACU  [facility] HospitalFERNANDEZ,Hillel Card HMD8:24 AMTD@  Mirena IUD lot number [id number]

## 2014-09-10 NOTE — Transfer of Care (Signed)
Immediate Anesthesia Transfer of Care Note  Patient: Debra Myers  Procedure(s) Performed: Procedure(s): DILATATION & CURETTAGE/HYSTEROSCOPY WITH MYOSURE/INSERTION OF IUD (N/A)  Patient Location: PACU  Anesthesia Type:General  Level of Consciousness: awake  Airway & Oxygen Therapy: Patient Spontanous Breathing  Post-op Assessment: Report given to PACU RN  Post vital signs: stable  Filed Vitals:   09/10/14 0614  BP: 155/76  Pulse: 106  Temp: 36.9 C  Resp: 20    Complications: No apparent anesthesia complications

## 2014-09-10 NOTE — Anesthesia Procedure Notes (Signed)
Procedure Name: LMA Insertion Date/Time: 09/10/2014 7:35 AM Performed by: Cephus ShellingBURGER, Merle Whitehorn A Pre-anesthesia Checklist: Patient being monitored, Patient identified, Emergency Drugs available and Suction available Patient Re-evaluated:Patient Re-evaluated prior to inductionOxygen Delivery Method: Circle system utilized Preoxygenation: Pre-oxygenation with 100% oxygen Intubation Type: IV induction and Inhalational induction Ventilation: Mask ventilation without difficulty LMA: LMA inserted LMA Size: 4.0 Number of attempts: 1 Airway Equipment and Method: Patient positioned with wedge pillow Placement Confirmation: positive ETCO2 and breath sounds checked- equal and bilateral Dental Injury: Teeth and Oropharynx as per pre-operative assessment

## 2014-09-11 ENCOUNTER — Encounter (HOSPITAL_COMMUNITY): Payer: Self-pay | Admitting: Gynecology

## 2014-10-01 ENCOUNTER — Ambulatory Visit (INDEPENDENT_AMBULATORY_CARE_PROVIDER_SITE_OTHER): Payer: PRIVATE HEALTH INSURANCE | Admitting: Gynecology

## 2014-10-01 ENCOUNTER — Encounter: Payer: Self-pay | Admitting: Gynecology

## 2014-10-01 VITALS — BP 128/86

## 2014-10-01 DIAGNOSIS — Z09 Encounter for follow-up examination after completed treatment for conditions other than malignant neoplasm: Secondary | ICD-10-CM

## 2014-10-01 DIAGNOSIS — Z30430 Encounter for insertion of intrauterine contraceptive device: Secondary | ICD-10-CM | POA: Insufficient documentation

## 2014-10-01 DIAGNOSIS — Z975 Presence of (intrauterine) contraceptive device: Secondary | ICD-10-CM | POA: Insufficient documentation

## 2014-10-01 NOTE — Progress Notes (Signed)
    Patient presented to the office today for her 3 week postop visit. Patient is status post resectoscopic polypectomy along with insertion of Mirena IUD secondary to endometrial polyp and menorrhagia. Pictures from her surgery as well as pathology report and findings were discussed with the patient as follows:  FINDINGS: Large anterior uterine polyp  Diagnosis Endometrial polyp - FRAGMENTS OF BENIGN ENDOMETRIAL POLYP. - NO EVIDENCE OF HYPERPLASIA OR MALIGNANCY.   Patient's without any complaints today has done well postop.    Exam:  Blood pressure 128/86 Gen. Appearance well-developed well-nourished female in no acute distress  Abdomen: Soft nontender no rebound or guarding Pelvic: R the urethra Skene was within normal limits Vagina: No lesions or discharge Cervix: No lesions or discharge IUD string visualized Bimanual exam: Uterus anteverted normal size shape and consistency Adnexa: No palpable mass or tenderness Rectal exam: Not done    assessment/plan: Patient 3 weeks status post resectoscopic polypectomy and placement of Mirena IUD as a result of dysfunction uterine bleeding attributed to endometrial polyp. Patient done well postop may resume full normal activity she scheduled to return back in 6 months for annual exam or when necessary.

## 2014-10-04 ENCOUNTER — Ambulatory Visit: Payer: PRIVATE HEALTH INSURANCE | Admitting: Gynecology

## 2014-10-17 LAB — HM DIABETES EYE EXAM

## 2014-10-21 ENCOUNTER — Encounter: Payer: Self-pay | Admitting: Family Medicine

## 2014-11-26 ENCOUNTER — Telehealth: Payer: Self-pay | Admitting: *Deleted

## 2014-11-26 NOTE — Telephone Encounter (Signed)
Pt called c/o hormone issues, c/o feeling emotional, having crying episodes. I advise pt to schedule OV with provider, appointments will contact pt.

## 2014-11-28 ENCOUNTER — Ambulatory Visit: Payer: PRIVATE HEALTH INSURANCE | Attending: Pain Medicine | Admitting: Pain Medicine

## 2014-11-28 ENCOUNTER — Encounter: Payer: Self-pay | Admitting: Pain Medicine

## 2014-11-28 VITALS — BP 117/75 | HR 95 | Temp 98.3°F | Resp 18 | Ht 64.0 in | Wt 265.0 lb

## 2014-11-28 DIAGNOSIS — F1190 Opioid use, unspecified, uncomplicated: Secondary | ICD-10-CM | POA: Diagnosis not present

## 2014-11-28 DIAGNOSIS — E042 Nontoxic multinodular goiter: Secondary | ICD-10-CM | POA: Insufficient documentation

## 2014-11-28 DIAGNOSIS — G894 Chronic pain syndrome: Secondary | ICD-10-CM | POA: Diagnosis not present

## 2014-11-28 DIAGNOSIS — M549 Dorsalgia, unspecified: Secondary | ICD-10-CM | POA: Insufficient documentation

## 2014-11-28 DIAGNOSIS — F1120 Opioid dependence, uncomplicated: Secondary | ICD-10-CM

## 2014-11-28 DIAGNOSIS — E663 Overweight: Secondary | ICD-10-CM | POA: Insufficient documentation

## 2014-11-28 DIAGNOSIS — I10 Essential (primary) hypertension: Secondary | ICD-10-CM | POA: Diagnosis not present

## 2014-11-28 DIAGNOSIS — E119 Type 2 diabetes mellitus without complications: Secondary | ICD-10-CM | POA: Insufficient documentation

## 2014-11-28 DIAGNOSIS — Z79899 Other long term (current) drug therapy: Secondary | ICD-10-CM | POA: Diagnosis not present

## 2014-11-28 DIAGNOSIS — Z79891 Long term (current) use of opiate analgesic: Secondary | ICD-10-CM

## 2014-11-28 DIAGNOSIS — J45909 Unspecified asthma, uncomplicated: Secondary | ICD-10-CM | POA: Diagnosis not present

## 2014-11-28 MED ORDER — METAXALONE 800 MG PO TABS: 800.0000 mg | ORAL_TABLET | Freq: Three times a day (TID) | ORAL | Status: DC

## 2014-11-28 MED ORDER — HYDROCODONE-ACETAMINOPHEN 5-325 MG PO TABS: 1.0000 | ORAL_TABLET | Freq: Two times a day (BID) | ORAL | Status: DC

## 2014-11-28 MED ORDER — KETOROLAC TROMETHAMINE 60 MG/2ML IM SOLN
60.0000 mg | Freq: Once | INTRAMUSCULAR | Status: AC
  Administered 2014-11-28: 60 mg via INTRAMUSCULAR

## 2014-11-28 MED ORDER — KETOROLAC TROMETHAMINE 60 MG/2ML IM SOLN
INTRAMUSCULAR | Status: AC
  Administered 2014-11-28: 60 mg via INTRAMUSCULAR
  Filled 2014-11-28: qty 2

## 2014-11-28 MED ORDER — HYDROCODONE-ACETAMINOPHEN 5-325 MG PO TABS: 1.0000 | ORAL_TABLET | Freq: Two times a day (BID) | ORAL | Status: DC | PRN

## 2014-11-28 NOTE — Progress Notes (Signed)
Safety precautions to be maintained throughout the outpatient stay will include: orient to surroundings, keep bed in low position, maintain call bell within reach at all times, provide assistance with transfer out of bed and ambulation.  Pills remaining 37/60 Hydrocodone

## 2014-12-02 DIAGNOSIS — Z79891 Long term (current) use of opiate analgesic: Secondary | ICD-10-CM | POA: Insufficient documentation

## 2014-12-02 DIAGNOSIS — F1190 Opioid use, unspecified, uncomplicated: Secondary | ICD-10-CM | POA: Insufficient documentation

## 2014-12-02 NOTE — Progress Notes (Signed)
Patient's Name: Debra Myers MRN: [id number] DOB: 1965-09-11 DOS: 11/28/2014  Primary Reason(s) for Visit: Encounter for Medication Management. CC: Back Pain   HPI:   Debra Myers is a 49 y.o. year old, female patient, who returns today as an established patient. She has GOITER, NONTOXIC MULTINODULAR; Diabetes (HCC); POLYCYSTIC OVARIES; HYPERTRIGLYCERIDEMIA; METABOLIC SYNDROME X; OBESITY; Anxiety state; Allergic rhinitis; Asthma; HIRSUTISM; Generalized anxiety disorder; Hypertension; Overweight(278.02); DUB (dysfunctional uterine bleeding); Endometrial polyp; GERD (gastroesophageal reflux disease); Ovarian cyst, right; IUD (intrauterine device) in place; Chronic pain syndrome; Opiate use; Uncomplicated opioid dependence (HCC); and Long term prescription opiate use on her problem list.. Her primarily concern today is the Back Pain   The patient is having an acute exacerbation of her pain today. She has asked to [name] to provide her with an IM injection of an analgesic.  Pharmacotherapy Review: Side-effects or Adverse reactions: None reported. Effectiveness: Described as relatively effective, allowing for increase in activities of daily living (ADL). Onset of action: Within expected pharmacological parameters. Duration of action: Within normal limits for medication. Peak effect: Timing and results are as within normal expected parameters. [place] PMP: Compliant with practice rules and regulations. DST: Compliant with practice rules and regulations. Lab work: No new labs ordered by our practice. Treatment compliance: Compliant. Substance Use Disorder (SUD) Risk Level: Low Planned course of action: Continue therapy as is.  Allergies: Debra Myers is allergic to oxycodone; penicillins; and sulfonamide derivatives.  Meds: The patient has a current medication list which includes the following prescription(s): albuterol, bupropion, cetirizine, fluticasone, hydrocodone-acetaminophen, ibuprofen, lamotrigine,  lorazepam, metformin, montelukast, naproxen sodium, omeprazole, paroxetine, spironolactone, vitamin d3, hydrocodone-acetaminophen, hydrocodone-acetaminophen, metaxalone, metoclopramide, and oxycodone-acetaminophen. Requested Prescriptions   Signed Prescriptions Disp Refills  . metaxalone (SKELAXIN) 800 MG tablet 90 tablet 2    Sig: Take 1 tablet (800 mg total) by mouth 3 (three) times daily.  Marland Kitchen HYDROcodone-acetaminophen (NORCO/VICODIN) 5-325 MG tablet 60 tablet 0    Sig: Take 1 tablet by mouth 2 (two) times daily.  Marland Kitchen HYDROcodone-acetaminophen (NORCO/VICODIN) 5-325 MG tablet 60 tablet 0    Sig: Take 1 tablet by mouth 2 (two) times daily as needed for moderate pain.    ROS: Constitutional: Afebrile, no chills, well hydrated and well nourished Gastrointestinal: negative Musculoskeletal:negative Neurological: negative Behavioral/Psych: negative  PFSH: Medical:  Debra Myers  has a past medical history of Depression; Vaginal delivery; Elective abortion; Asthma; Diabetes mellitus; Anxiety; Hypertension; Degenerative disc disease; Degenerative lumbar disc; and GERD (gastroesophageal reflux disease). Family: family history includes Asthma in her maternal grandmother; Diabetes in her father; Hypertension in her father. Surgical:  has past surgical history that includes Cholecystectomy (2001); Lapband (04/2012); Wisdom tooth extraction; and Dilatation & curettage/hysteroscopy with myosure (N/A, 09/10/2014). Tobacco:  reports that she has never smoked. She has never used smokeless tobacco. Alcohol:  reports that she drinks alcohol. Drug:  reports that she does not use illicit drugs.  Physical Exam: Vitals:  Today's Vitals   11/28/14 0857 11/28/14 0904  BP: 117/75   Pulse: 95   Temp: 98.3 F (36.8 C)   TempSrc: Oral   Resp: 18   Height:  (1.626 m)   Weight: 265 lb (120.203 kg)   SpO2: 99%   PainSc:  5   Calculated BMI: Body mass index is 45.46 kg/(m^2). General appearance: alert,  cooperative, appears stated age and moderate distress Eyes: conjunctivae/corneas clear. PERRL, EOM's intact. Fundi benign. Lungs: No evidence respiratory distress, no audible rales or ronchi and no use of accessory muscles of respiration Neck:  no adenopathy, no carotid bruit, no JVD, supple, symmetrical, trachea midline and thyroid not enlarged, symmetric, no tenderness/mass/nodules Back: symmetric, no curvature. ROM normal. No CVA tenderness. Extremities: extremities normal, atraumatic, no cyanosis or edema Pulses: 2+ and symmetric Skin: Skin color, texture, turgor normal. No rashes or lesions Neurologic: Gait: Antalgic    Assessment: Encounter Diagnosis:  Primary Diagnosis: Chronic pain syndrome [G89.4]  Plan: Debra Myers was seen today for back pain.  Diagnoses and all orders for this visit:  Chronic pain syndrome -     ketorolac (TORADOL) 60 MG/2ML injection;  -     metaxalone (SKELAXIN) 800 MG tablet; Take 1 tablet (800 mg total) by mouth 3 (three) times daily. -     ketorolac (TORADOL) injection 60 mg; Inject 2 mLs (60 mg total) into the muscle once. -     HYDROcodone-acetaminophen (NORCO/VICODIN) 5-325 MG tablet; Take 1 tablet by mouth 2 (two) times daily. -     HYDROcodone-acetaminophen (NORCO/VICODIN) 5-325 MG tablet; Take 1 tablet by mouth 2 (two) times daily as needed for moderate pain.  Opiate use  Uncomplicated opioid dependence (HCC)  Long term prescription opiate use     There are no Patient Instructions on file for this visit. Medications discontinued today:  Medications Discontinued During This Encounter  Medication Reason  . metaxalone (SKELAXIN) 800 MG tablet Duplicate   Medications administered today:  We administered ketorolac and ketorolac.  Primary Care Physician: Ruthe Mannan, MD Location: [facility] Note by: Sydnee Levans. Laban Emperor, M.D, DABA, DABAPM, DABPM, DABIPP, FIPP

## 2014-12-16 ENCOUNTER — Encounter: Payer: PRIVATE HEALTH INSURANCE | Admitting: Pain Medicine

## 2014-12-26 ENCOUNTER — Other Ambulatory Visit: Payer: Self-pay | Admitting: Pain Medicine

## 2014-12-26 ENCOUNTER — Other Ambulatory Visit: Payer: Self-pay | Admitting: Family Medicine

## 2014-12-31 ENCOUNTER — Other Ambulatory Visit: Payer: Self-pay | Admitting: Family Medicine

## 2015-01-09 ENCOUNTER — Other Ambulatory Visit: Payer: Self-pay

## 2015-01-09 MED ORDER — METFORMIN HCL 500 MG PO TABS: ORAL_TABLET | ORAL | Status: DC

## 2015-01-09 NOTE — Telephone Encounter (Signed)
Pt request refill on aldactone and metformin to [facility] [address]; [facility] and pt last got metformin filled 12/01/14. Pt has appt scheduled for 01/13/15 to see Dr Dayton MartesAron for med refills; pt will come fasting to appt incase labs need to be done. Per protocol will refill metformin # 30 x 0 since pt seen for diabetic appt on 05-08-14. Pt voiced understanding and will ck with pharmacy later today.

## 2015-01-13 ENCOUNTER — Encounter: Payer: Self-pay | Admitting: *Deleted

## 2015-01-13 ENCOUNTER — Ambulatory Visit (INDEPENDENT_AMBULATORY_CARE_PROVIDER_SITE_OTHER): Payer: PRIVATE HEALTH INSURANCE | Admitting: Family Medicine

## 2015-01-13 ENCOUNTER — Encounter: Payer: Self-pay | Admitting: Family Medicine

## 2015-01-13 VITALS — BP 126/92 | HR 93 | Temp 98.1°F | Wt 252.5 lb

## 2015-01-13 DIAGNOSIS — G894 Chronic pain syndrome: Secondary | ICD-10-CM | POA: Diagnosis not present

## 2015-01-13 DIAGNOSIS — I10 Essential (primary) hypertension: Secondary | ICD-10-CM | POA: Diagnosis not present

## 2015-01-13 DIAGNOSIS — E119 Type 2 diabetes mellitus without complications: Secondary | ICD-10-CM | POA: Diagnosis not present

## 2015-01-13 LAB — COMPREHENSIVE METABOLIC PANEL
ALT: 16 U/L (ref 0–35)
AST: 16 U/L (ref 0–37)
Albumin: 3.9 g/dL (ref 3.5–5.2)
Alkaline Phosphatase: 99 U/L (ref 39–117)
BUN: 11 mg/dL (ref 6–23)
CHLORIDE: 105 meq/L (ref 96–112)
CO2: 25 meq/L (ref 19–32)
Calcium: 8.5 mg/dL (ref 8.4–10.5)
Creatinine, Ser: 0.75 mg/dL (ref 0.40–1.20)
GFR: 87.48 mL/min (ref >60.00)
Glucose, Bld: 93 mg/dL (ref 70–99)
Potassium: 4.3 mEq/L (ref 3.5–5.1)
SODIUM: 137 meq/L (ref 135–145)
Total Bilirubin: 0.3 mg/dL (ref 0.2–1.2)
Total Protein: 6.8 g/dL (ref 6.0–8.3)

## 2015-01-13 LAB — LIPID PANEL
CHOL/HDL RATIO: 3
Cholesterol: 154 mg/dL (ref 0–200)
HDL: 51.1 mg/dL (ref >39.00)
LDL CALC: 92 mg/dL (ref 0–99)
NONHDL: 102.56
Triglycerides: 55 mg/dL (ref 0.0–149.0)
VLDL: 11 mg/dL (ref 0.0–40.0)

## 2015-01-13 LAB — TSH: TSH: 0.65 u[IU]/mL (ref 0.35–4.50)

## 2015-01-13 LAB — HEMOGLOBIN A1C: Hgb A1c MFr Bld: 5.9 % (ref 4.6–6.5)

## 2015-01-13 MED ORDER — MONTELUKAST SODIUM 10 MG PO TABS: 10.0000 mg | ORAL_TABLET | Freq: Every day | ORAL | Status: DC

## 2015-01-13 MED ORDER — OMEPRAZOLE 20 MG PO CPDR: 20.0000 mg | DELAYED_RELEASE_CAPSULE | Freq: Every day | ORAL | Status: DC

## 2015-01-13 MED ORDER — METFORMIN HCL 500 MG PO TABS: ORAL_TABLET | ORAL | Status: DC

## 2015-01-13 MED ORDER — CETIRIZINE HCL 10 MG PO TABS: ORAL_TABLET | ORAL | Status: DC

## 2015-01-13 MED ORDER — ALBUTEROL SULFATE HFA 108 (90 BASE) MCG/ACT IN AERS: 2.0000 | INHALATION_SPRAY | Freq: Four times a day (QID) | RESPIRATORY_TRACT | Status: DC | PRN

## 2015-01-13 MED ORDER — SPIRONOLACTONE 25 MG PO TABS: ORAL_TABLET | ORAL | Status: DC

## 2015-01-13 MED ORDER — FLUTICASONE PROPIONATE 50 MCG/ACT NA SUSP: 2.0000 | Freq: Every day | NASAL | Status: DC

## 2015-01-13 NOTE — Patient Instructions (Signed)
Good to see you. Happy holidays.  We will call you with your lab results.

## 2015-01-13 NOTE — Assessment & Plan Note (Signed)
Followed by pain clinic.  

## 2015-01-13 NOTE — Progress Notes (Signed)
Pre visit review using our clinic review tool, if applicable. No additional management support is needed unless otherwise documented below in the visit note. 

## 2015-01-13 NOTE — Progress Notes (Signed)
Subjective:   Patient ID: Debra Myers, female    DOB: 1965/04/25, 49 y.o.   MRN: [id number]  Debra ClockRobin D Prell is a pleasant 49 y.o. year old female who presents to clinic today for med refills/ Follow-up  on 01/13/2015  HPI:  Chronic pain- followed by Dr. Laban EmperorNaveira at pain clinic.  Was last seen on 11/28/14.  Note reviewed.   DM- does not check FSBS regularly. Overdue for a1c. Neg urine micro 04/2014. LDL has been at goal.  Does feel like her hair is falling out more despite taking spironolactone.  Thinks it's from the mirena IUD but wants TSH checked today too. Lab Results  Component Value Date   HGBA1C 6.1* 02/14/2012    Lab Results  Component Value Date   CHOL 146 05/18/2013   HDL 55 05/18/2013   LDLCALC 70 05/18/2013   TRIG 106 05/18/2013   CHOLHDL 2.7 05/18/2013    HTN- on Aldactone 20 mg daily.  Denies any HA, blurred vision, CP or SOB.  Lab Results  Component Value Date   CREATININE 0.84 09/06/2014   Anxiety- psychiatrist does manage her rx and she feels they are working well.  Patient Active Problem List   Diagnosis Date Noted  . Opiate use 12/02/2014  . Uncomplicated opioid dependence (HCC) 12/02/2014  . Long term prescription opiate use 12/02/2014  . Chronic pain syndrome 11/28/2014  . IUD (intrauterine device) in place 10/01/2014  . Ovarian cyst, right 08/02/2014  . GERD (gastroesophageal reflux disease) 01/30/2014  . Endometrial polyp 08/03/2013  . DUB (dysfunctional uterine bleeding) 07/14/2012  . Overweight(278.02) 02/14/2012  . Hypertension 07/09/2011  . HYPERTRIGLYCERIDEMIA 12/27/2008  . GOITER, NONTOXIC MULTINODULAR 12/14/2006  . Diabetes (HCC) 12/14/2006  . POLYCYSTIC OVARIES 12/14/2006  . METABOLIC SYNDROME X 12/14/2006  . OBESITY 12/14/2006  . Anxiety state 12/14/2006  . Allergic rhinitis 12/14/2006  . Asthma 12/14/2006  . HIRSUTISM 12/14/2006  . Generalized anxiety disorder 12/14/2006   Past Medical History  Diagnosis Date  .  Depression   . Vaginal delivery     ONE NSVD  . Elective abortion     ONE  . Asthma     ALLERGY INDUCED  . Diabetes mellitus     TYPE 2  . Anxiety   . Hypertension   . Degenerative disc disease   . Degenerative lumbar disc   . GERD (gastroesophageal reflux disease)     takes prilosec    Past Surgical History  Procedure Laterality Date  . Cholecystectomy  2001  . Lapband  04/2012  . Wisdom tooth extraction    . Dilatation & curettage/hysteroscopy with myosure N/A 09/10/2014    Procedure: DILATATION & CURETTAGE/HYSTEROSCOPY WITH MYOSURE/INSERTION OF IUD;  Surgeon: Ok EdwardsJuan H Fernandez, MD;  Location: [facility];  Service: Gynecology;  Laterality: N/A;   Social History  Substance Use Topics  . Smoking status: Never Smoker   . Smokeless tobacco: Never Used  . Alcohol Use: 0.0 oz/week    0 Standard drinks or equivalent per week     Comment: rarely   Family History  Problem Relation Age of Onset  . Hypertension Father   . Diabetes Father   . Asthma Maternal Grandmother    Allergies  Allergen Reactions  . Oxycodone Itching  . Penicillins     REACTION: GI UPSET  . Sulfonamide Derivatives     REACTION: rash   Current Outpatient Prescriptions on File Prior to Visit  Medication Sig Dispense Refill  . albuterol (PROVENTIL HFA;VENTOLIN HFA) 108 (  90 BASE) MCG/ACT inhaler Inhale 2 puffs into the lungs every 6 (six) hours as needed for wheezing or shortness of breath. 1 Inhaler 5  . buPROPion (WELLBUTRIN XL) 300 MG 24 hr tablet Take 300 mg by mouth daily.    . cetirizine (ZYRTEC) 10 MG tablet take 1 tablet by mouth once daily 30 tablet 11  . Cholecalciferol (VITAMIN D3) 5000 UNITS CAPS Take 1 capsule by mouth at bedtime.    . fluticasone (FLONASE) 50 MCG/ACT nasal spray Place 2 sprays into both nostrils daily. (Patient taking differently: Place 2 sprays into both nostrils daily as needed for allergies. ) 16 g 1  . HYDROcodone-acetaminophen (NORCO/VICODIN) 5-325 MG tablet Take 1 tablet by  mouth 2 (two) times daily as needed for moderate pain. 60 tablet 0  . ibuprofen (ADVIL,MOTRIN) 200 MG tablet Take 400 mg by mouth every 4 (four) hours as needed.    . lamoTRIgine (LAMICTAL) 200 MG tablet Take 300 mg by mouth daily. Take 1 and 1/2 tablets daily.    Marland Kitchen LORazepam (ATIVAN) 1 MG tablet Take 1 mg by mouth at bedtime as needed for sleep.    . metaxalone (SKELAXIN) 800 MG tablet Take 1 tablet (800 mg total) by mouth 3 (three) times daily. 90 tablet 2  . metFORMIN (GLUCOPHAGE) 500 MG tablet take 1 tablet by mouth once daily WITH BREAKFAST 30 tablet 0  . montelukast (SINGULAIR) 10 MG tablet take 1 tablet by mouth at bedtime 30 tablet 7  . naproxen sodium (ALEVE) 220 MG tablet Take 440 mg by mouth daily as needed (pain).    Marland Kitchen omeprazole (PRILOSEC) 20 MG capsule Take 1 capsule (20 mg total) by mouth daily. 30 capsule 11  . spironolactone (ALDACTONE) 25 MG tablet take 1 tablet by mouth once daily 30 tablet 11   No current facility-administered medications on file prior to visit.   The PMH, PSH, Social History, Family History, Medications, and allergies have been reviewed in [facility], and have been updated if relevant.  Review of Systems  Constitutional: Negative.   HENT: Negative.   Eyes: Negative.   Respiratory: Negative.   Cardiovascular: Negative.   Gastrointestinal: Negative.   Musculoskeletal: Negative.   Skin: Negative.   Psychiatric/Behavioral: The patient is nervous/anxious.       See HPI  Objective:    BP 126/92 mmHg  Pulse 93  Temp(Src) 98.1 F (36.7 C) (Oral)  Wt 252 lb 8 oz (114.533 kg)  SpO2 97%   Physical Exam  Constitutional: She is oriented to person, place, and time. She appears well-developed and well-nourished. No distress.  HENT:  Head: Normocephalic.  Eyes: Conjunctivae are normal.  Cardiovascular: Normal rate and regular rhythm.   Pulmonary/Chest: Effort normal.  Musculoskeletal: Normal range of motion. She exhibits no edema.  Neurological: She is  alert and oriented to person, place, and time. No cranial nerve deficit.  Skin: Skin is warm and dry.  Psychiatric: She has a normal mood and affect. Her behavior is normal. Judgment and thought content normal.  Nursing note and vitals reviewed.         Assessment & Plan:   Essential hypertension  Type 2 diabetes mellitus without complication, without long-term current use of insulin (HCC)  Chronic pain syndrome No Follow-up on file.

## 2015-01-13 NOTE — Assessment & Plan Note (Signed)
Well controlled.  No changes made. 

## 2015-01-13 NOTE — Assessment & Plan Note (Signed)
No changes made to rxs. Labs today.  Orders Placed This Encounter  Procedures  . Hemoglobin A1c  . Comprehensive metabolic panel  . Lipid panel  . TSH

## 2015-01-23 ENCOUNTER — Ambulatory Visit: Payer: PRIVATE HEALTH INSURANCE | Admitting: Women's Health

## 2015-01-24 ENCOUNTER — Encounter: Payer: Self-pay | Admitting: Women's Health

## 2015-01-24 ENCOUNTER — Ambulatory Visit (INDEPENDENT_AMBULATORY_CARE_PROVIDER_SITE_OTHER): Payer: PRIVATE HEALTH INSURANCE | Admitting: Women's Health

## 2015-01-24 VITALS — BP 123/83

## 2015-01-24 DIAGNOSIS — R350 Frequency of micturition: Secondary | ICD-10-CM

## 2015-01-24 DIAGNOSIS — N898 Other specified noninflammatory disorders of vagina: Secondary | ICD-10-CM

## 2015-01-24 DIAGNOSIS — R1031 Right lower quadrant pain: Secondary | ICD-10-CM | POA: Diagnosis not present

## 2015-01-24 LAB — URINALYSIS W MICROSCOPIC + REFLEX CULTURE
Bilirubin Urine: NEGATIVE
CASTS: NONE SEEN [LPF]
Crystals: NONE SEEN [HPF]
Glucose, UA: NEGATIVE
Hgb urine dipstick: NEGATIVE
KETONES UR: NEGATIVE
Leukocytes, UA: NEGATIVE
NITRITE: NEGATIVE
Protein, ur: NEGATIVE
RBC / HPF: NONE SEEN RBC/HPF (ref <=2)
Specific Gravity, Urine: 1.025 (ref 1.001–1.035)
WBC, UA: NONE SEEN WBC/HPF (ref <=5)
Yeast: NONE SEEN [HPF]
pH: 6 (ref 5.0–8.0)

## 2015-01-24 LAB — WET PREP FOR TRICH, YEAST, CLUE
Clue Cells Wet Prep HPF POC: NONE SEEN
Trich, Wet Prep: NONE SEEN
Yeast Wet Prep HPF POC: NONE SEEN

## 2015-01-24 NOTE — Patient Instructions (Signed)
Ovarian Cyst An ovarian cyst is a fluid-filled sac that forms on an ovary. The ovaries are small organs that produce eggs in women. Various types of cysts can form on the ovaries. Most are not cancerous. Many do not cause problems, and they often go away on their own. Some may cause symptoms and require treatment. Common types of ovarian cysts include:  Functional cysts--These cysts may occur every month during the menstrual cycle. This is normal. The cysts usually go away with the next menstrual cycle if the woman does not get pregnant. Usually, there are no symptoms with a functional cyst.  Endometrioma cysts--These cysts form from the tissue that lines the uterus. They are also called "chocolate cysts" because they become filled with blood that turns brown. This type of cyst can cause pain in the lower abdomen during intercourse and with your menstrual period.  Cystadenoma cysts--This type develops from the cells on the outside of the ovary. These cysts can get very big and cause lower abdomen pain and pain with intercourse. This type of cyst can twist on itself, cut off its blood supply, and cause severe pain. It can also easily rupture and cause a lot of pain.  Dermoid cysts--This type of cyst is sometimes found in both ovaries. These cysts may contain different kinds of body tissue, such as skin, teeth, hair, or cartilage. They usually do not cause symptoms unless they get very big.  Theca lutein cysts--These cysts occur when too much of a certain hormone (human chorionic gonadotropin) is produced and overstimulates the ovaries to produce an egg. This is most common after procedures used to assist with the conception of a baby (in vitro fertilization). CAUSES   Fertility drugs can cause a condition in which multiple large cysts are formed on the ovaries. This is called ovarian hyperstimulation syndrome.  A condition called polycystic ovary syndrome can cause hormonal imbalances that can lead to  nonfunctional ovarian cysts. SIGNS AND SYMPTOMS  Many ovarian cysts do not cause symptoms. If symptoms are present, they may include:  Pelvic pain or pressure.  Pain in the lower abdomen.  Pain during sexual intercourse.  Increasing girth (swelling) of the abdomen.  Abnormal menstrual periods.  Increasing pain with menstrual periods.  Stopping having menstrual periods without being pregnant. DIAGNOSIS  These cysts are commonly found during a routine or annual pelvic exam. Tests may be ordered to find out more about the cyst. These tests may include:  Ultrasound.  X-ray of the pelvis.  CT scan.  MRI.  Blood tests. TREATMENT  Many ovarian cysts go away on their own without treatment. Your health care provider may want to check your cyst regularly for 2-3 months to see if it changes. For women in menopause, it is particularly important to monitor a cyst closely because of the higher rate of ovarian cancer in menopausal women. When treatment is needed, it may include any of the following:  A procedure to drain the cyst (aspiration). This may be done using a long needle and ultrasound. It can also be done through a laparoscopic procedure. This involves using a thin, lighted tube with a tiny camera on the end (laparoscope) inserted through a small incision.  Surgery to remove the whole cyst. This may be done using laparoscopic surgery or an open surgery involving a larger incision in the lower abdomen.  Hormone treatment or birth control pills. These methods are sometimes used to help dissolve a cyst. HOME CARE INSTRUCTIONS   Only take over-the-counter   or prescription medicines as directed by your health care provider.  Follow up with your health care provider as directed.  Get regular pelvic exams and Pap tests. SEEK MEDICAL CARE IF:   Your periods are late, irregular, or painful, or they stop.  Your pelvic pain or abdominal pain does not go away.  Your abdomen becomes  larger or swollen.  You have pressure on your bladder or trouble emptying your bladder completely.  You have pain during sexual intercourse.  You have feelings of fullness, pressure, or discomfort in your stomach.  You lose weight for no apparent reason.  You feel generally ill.  You become constipated.  You lose your appetite.  You develop acne.  You have an increase in body and facial hair.  You are gaining weight, without changing your exercise and eating habits.  You think you are pregnant. SEEK IMMEDIATE MEDICAL CARE IF:   You have increasing abdominal pain.  You feel sick to your stomach (nauseous), and you throw up (vomit).  You develop a fever that comes on suddenly.  You have abdominal pain during a bowel movement.  Your menstrual periods become heavier than usual. MAKE SURE YOU:  Understand these instructions.  Will watch your condition.  Will get help right away if you are not doing well or get worse.   This information is not intended to replace advice given to you by your health care provider. Make sure you discuss any questions you have with your health care provider.   Document Released: 02/08/2005 Document Revised: 02/13/2013 Document Reviewed: 10/16/2012 Elsevier Interactive Patient Education 2016 [employer]  

## 2015-01-24 NOTE — Progress Notes (Signed)
Patient ID: Debra Myers, female   DOB: September 13, 1965, 49 y.o.   MRN: [id number] Presents with complaint of increased pelvic pressure, lower right sided discomfort with a pressure sensation, when bending over feels a pinching sensation questions if it is IUD for the past 2 -3 weeks. Also having some urinary urgency, denies pain or burning with urination, minimal discharge without itching or odor. No fever.  History of chronic back pain reports pain is different. 09/2014 endometrial polyp removed Mirena IUD placed. History of anxiety and depression.  Exam: Appears well. No CVAT, abdomen soft, obese, nontender no rebound or radiation of pain. External genitalia within normal limits, speculum exam scant discharge wet prep negative IUD strings visible, bimanual no CMT, exam limited due to habitus. UA: Negative  Right lower quadrant pressure Urinary urgency  Plan: Ultrasound will schedule. Urine culture pending. Reviewed importance of emptying bladder in a regular intervals, avoiding caffeinated beverages.

## 2015-01-26 LAB — URINE CULTURE

## 2015-01-31 ENCOUNTER — Other Ambulatory Visit: Payer: PRIVATE HEALTH INSURANCE

## 2015-01-31 ENCOUNTER — Ambulatory Visit: Payer: PRIVATE HEALTH INSURANCE | Admitting: Women's Health

## 2015-02-04 ENCOUNTER — Encounter: Payer: Self-pay | Admitting: Gynecology

## 2015-02-07 ENCOUNTER — Ambulatory Visit: Payer: Self-pay | Admitting: Physician Assistant

## 2015-02-07 ENCOUNTER — Encounter: Payer: Self-pay | Admitting: Physician Assistant

## 2015-02-07 VITALS — BP 130/80 | HR 96 | Temp 98.1°F

## 2015-02-07 DIAGNOSIS — N390 Urinary tract infection, site not specified: Secondary | ICD-10-CM

## 2015-02-07 LAB — POCT URINALYSIS DIPSTICK
Bilirubin, UA: NEGATIVE
Glucose, UA: NEGATIVE
Ketones, UA: NEGATIVE
Nitrite, UA: POSITIVE
PH UA: 6
PROTEIN UA: NEGATIVE
SPEC GRAV UA: 1.02
UROBILINOGEN UA: 0.2

## 2015-02-07 MED ORDER — PHENAZOPYRIDINE HCL 200 MG PO TABS: 200.0000 mg | ORAL_TABLET | Freq: Three times a day (TID) | ORAL | Status: DC | PRN

## 2015-02-07 MED ORDER — CIPROFLOXACIN HCL 500 MG PO TABS: 500.0000 mg | ORAL_TABLET | Freq: Two times a day (BID) | ORAL | Status: DC

## 2015-02-07 NOTE — Progress Notes (Signed)
S. Patient c/o dysuria for 3 days.  Also c/o frequency and hesitancy but denies vaginal discharge and flank pain. O.  No acute distress. Dip UA revealed +nitrate and Leuc. A.  UTI P.  Cipro and Pyridium.  F/U PCP in one week.

## 2015-02-07 NOTE — Addendum Note (Signed)
Addended by: Catha BrowEACON, Raesha Coonrod T on: 02/07/2015 02:03 PM   Modules accepted: Orders

## 2015-02-12 ENCOUNTER — Ambulatory Visit (INDEPENDENT_AMBULATORY_CARE_PROVIDER_SITE_OTHER): Payer: PRIVATE HEALTH INSURANCE | Admitting: Women's Health

## 2015-02-12 ENCOUNTER — Encounter: Payer: Self-pay | Admitting: Women's Health

## 2015-02-12 ENCOUNTER — Ambulatory Visit (INDEPENDENT_AMBULATORY_CARE_PROVIDER_SITE_OTHER): Payer: PRIVATE HEALTH INSURANCE

## 2015-02-12 ENCOUNTER — Other Ambulatory Visit: Payer: Self-pay | Admitting: Women's Health

## 2015-02-12 VITALS — BP 138/80 | Ht 66.0 in | Wt 252.0 lb

## 2015-02-12 DIAGNOSIS — N83201 Unspecified ovarian cyst, right side: Secondary | ICD-10-CM

## 2015-02-12 DIAGNOSIS — B373 Candidiasis of vulva and vagina: Secondary | ICD-10-CM | POA: Diagnosis not present

## 2015-02-12 DIAGNOSIS — R1031 Right lower quadrant pain: Secondary | ICD-10-CM | POA: Diagnosis not present

## 2015-02-12 DIAGNOSIS — B3731 Acute candidiasis of vulva and vagina: Secondary | ICD-10-CM

## 2015-02-12 DIAGNOSIS — R350 Frequency of micturition: Secondary | ICD-10-CM | POA: Diagnosis not present

## 2015-02-12 LAB — WET PREP FOR TRICH, YEAST, CLUE
CLUE CELLS WET PREP: NONE SEEN
Trich, Wet Prep: NONE SEEN
WBC, Wet Prep HPF POC: NONE SEEN
Yeast Wet Prep HPF POC: NONE SEEN

## 2015-02-12 LAB — URINALYSIS W MICROSCOPIC + REFLEX CULTURE
Bilirubin Urine: NEGATIVE
CRYSTALS: NONE SEEN [HPF]
Casts: NONE SEEN [LPF]
Glucose, UA: NEGATIVE
Hgb urine dipstick: NEGATIVE
Ketones, ur: NEGATIVE
Leukocytes, UA: NEGATIVE
Nitrite: NEGATIVE
PROTEIN: NEGATIVE
RBC / HPF: NONE SEEN RBC/HPF (ref <=2)
Specific Gravity, Urine: >1.03 (ref 1.001–1.035)
Yeast: NONE SEEN [HPF]
pH: 5 (ref 5.0–8.0)

## 2015-02-12 MED ORDER — FLUCONAZOLE 150 MG PO TABS: 150.0000 mg | ORAL_TABLET | Freq: Once | ORAL | Status: DC

## 2015-02-12 NOTE — Progress Notes (Signed)
Patient ID: Debra Myers, female   DOB: Jun 30, 1965, 49 y.o.   MRN: [id number] Presents for ultrasound. Was having intermittent intense lower right and left lower quadrant pain that is now resolving.  UTI treated with Cipro at health services at work 2 weeks ago, questions if has yeast infection now. 08/2014 benign endometrial polypectomy and insertion of Mirena IUD. Denies a fever. History of ovarian cysts.  Exam: Appears well. UA: Negative. External genitalia mild erythema,  speculum exam scant discharge, wet prep negative. Ultrasound: T/V anteverted uterus with IUD seen in normal position. Right ovary rim of tissue seen with thin-walled echo-free avascular cyst 43 x 48 x 37 mean 43 mm. Avascular. Left ovary normal. Negative cul-de-sac. Endometrium 3.2 mm  Right ovarian cyst Resolved UTI  Plan: Repeat ultrasound in 2 months to check for resolution of cyst, reviewed most likely physiologic. Reassured IUD in proper position. Prescription for Diflucan 150 mg if needed. History of yeast infections after antibiotics.

## 2015-02-12 NOTE — Patient Instructions (Addendum)
Ovarian Cyst An ovarian cyst is a fluid-filled sac that forms on an ovary. The ovaries are small organs that produce eggs in women. Various types of cysts can form on the ovaries. Most are not cancerous. Many do not cause problems, and they often go away on their own. Some may cause symptoms and require treatment. Common types of ovarian cysts include:  Functional cysts--These cysts may occur every month during the menstrual cycle. This is normal. The cysts usually go away with the next menstrual cycle if the woman does not get pregnant. Usually, there are no symptoms with a functional cyst.  Endometrioma cysts--These cysts form from the tissue that lines the uterus. They are also called "chocolate cysts" because they become filled with blood that turns brown. This type of cyst can cause pain in the lower abdomen during intercourse and with your menstrual period.  Cystadenoma cysts--This type develops from the cells on the outside of the ovary. These cysts can get very big and cause lower abdomen pain and pain with intercourse. This type of cyst can twist on itself, cut off its blood supply, and cause severe pain. It can also easily rupture and cause a lot of pain.  Dermoid cysts--This type of cyst is sometimes found in both ovaries. These cysts may contain different kinds of body tissue, such as skin, teeth, hair, or cartilage. They usually do not cause symptoms unless they get very big.  Theca lutein cysts--These cysts occur when too much of a certain hormone (human chorionic gonadotropin) is produced and overstimulates the ovaries to produce an egg. This is most common after procedures used to assist with the conception of a baby (in vitro fertilization). CAUSES   Fertility drugs can cause a condition in which multiple large cysts are formed on the ovaries. This is called ovarian hyperstimulation syndrome.  A condition called polycystic ovary syndrome can cause hormonal imbalances that can lead to  nonfunctional ovarian cysts. SIGNS AND SYMPTOMS  Many ovarian cysts do not cause symptoms. If symptoms are present, they may include:  Pelvic pain or pressure.  Pain in the lower abdomen.  Pain during sexual intercourse.  Increasing girth (swelling) of the abdomen.  Abnormal menstrual periods.  Increasing pain with menstrual periods.  Stopping having menstrual periods without being pregnant. DIAGNOSIS  These cysts are commonly found during a routine or annual pelvic exam. Tests may be ordered to find out more about the cyst. These tests may include:  Ultrasound.  X-ray of the pelvis.  CT scan.  MRI.  Blood tests. TREATMENT  Many ovarian cysts go away on their own without treatment. Your health care provider may want to check your cyst regularly for 2-3 months to see if it changes. For women in menopause, it is particularly important to monitor a cyst closely because of the higher rate of ovarian cancer in menopausal women. When treatment is needed, it may include any of the following:  A procedure to drain the cyst (aspiration). This may be done using a long needle and ultrasound. It can also be done through a laparoscopic procedure. This involves using a thin, lighted tube with a tiny camera on the end (laparoscope) inserted through a small incision.  Surgery to remove the whole cyst. This may be done using laparoscopic surgery or an open surgery involving a larger incision in the lower abdomen.  Hormone treatment or birth control pills. These methods are sometimes used to help dissolve a cyst. HOME CARE INSTRUCTIONS   Only take over-the-counter   or prescription medicines as directed by your health care provider.  Follow up with your health care provider as directed.  Get regular pelvic exams and Pap tests. SEEK MEDICAL CARE IF:   Your periods are late, irregular, or painful, or they stop.  Your pelvic pain or abdominal pain does not go away.  Your abdomen becomes  larger or swollen.  You have pressure on your bladder or trouble emptying your bladder completely.  You have pain during sexual intercourse.  You have feelings of fullness, pressure, or discomfort in your stomach.  You lose weight for no apparent reason.  You feel generally ill.  You become constipated.  You lose your appetite.  You develop acne.  You have an increase in body and facial hair.  You are gaining weight, without changing your exercise and eating habits.  You think you are pregnant. SEEK IMMEDIATE MEDICAL CARE IF:   You have increasing abdominal pain.  You feel sick to your stomach (nauseous), and you throw up (vomit).  You develop a fever that comes on suddenly.  You have abdominal pain during a bowel movement.  Your menstrual periods become heavier than usual. MAKE SURE YOU:  Understand these instructions.  Will watch your condition.  Will get help right away if you are not doing well or get worse.   This information is not intended to replace advice given to you by your health care provider. Make sure you discuss any questions you have with your health care provider.   Document Released: 02/08/2005 Document Revised: 02/13/2013 Document Reviewed: 10/16/2012 Elsevier Interactive Patient Education [employer]2016 [employer] 3

## 2015-02-13 LAB — URINE CULTURE
Colony Count: NO GROWTH
ORGANISM ID, BACTERIA: NO GROWTH

## 2015-02-26 ENCOUNTER — Ambulatory Visit
Admission: RE | Admit: 2015-02-26 | Discharge: 2015-02-26 | Disposition: A | Discharge status: Home / Self Care | Payer: Managed Care, Other (non HMO) | Source: Ambulatory Visit | Attending: Pain Medicine | Admitting: Pain Medicine

## 2015-02-26 ENCOUNTER — Other Ambulatory Visit: Payer: Self-pay | Admitting: Pain Medicine

## 2015-02-26 ENCOUNTER — Ambulatory Visit (HOSPITAL_BASED_OUTPATIENT_CLINIC_OR_DEPARTMENT_OTHER): Payer: Managed Care, Other (non HMO) | Admitting: Pain Medicine

## 2015-02-26 ENCOUNTER — Encounter: Payer: Self-pay | Admitting: Pain Medicine

## 2015-02-26 VITALS — BP 138/63 | HR 70 | Temp 98.4°F | Resp 16 | Ht 64.0 in | Wt 268.0 lb

## 2015-02-26 DIAGNOSIS — M545 Low back pain, unspecified: Secondary | ICD-10-CM

## 2015-02-26 DIAGNOSIS — M50322 Other cervical disc degeneration at C5-C6 level: Secondary | ICD-10-CM | POA: Insufficient documentation

## 2015-02-26 DIAGNOSIS — M5412 Radiculopathy, cervical region: Secondary | ICD-10-CM

## 2015-02-26 DIAGNOSIS — G894 Chronic pain syndrome: Secondary | ICD-10-CM | POA: Diagnosis not present

## 2015-02-26 DIAGNOSIS — M51369 Other intervertebral disc degeneration, lumbar region without mention of lumbar back pain or lower extremity pain: Secondary | ICD-10-CM

## 2015-02-26 DIAGNOSIS — F1120 Opioid dependence, uncomplicated: Secondary | ICD-10-CM

## 2015-02-26 DIAGNOSIS — M5126 Other intervertebral disc displacement, lumbar region: Secondary | ICD-10-CM | POA: Diagnosis not present

## 2015-02-26 DIAGNOSIS — Z7984 Long term (current) use of oral hypoglycemic drugs: Secondary | ICD-10-CM | POA: Diagnosis not present

## 2015-02-26 DIAGNOSIS — M50323 Other cervical disc degeneration at C6-C7 level: Secondary | ICD-10-CM | POA: Diagnosis not present

## 2015-02-26 DIAGNOSIS — E669 Obesity, unspecified: Secondary | ICD-10-CM | POA: Diagnosis not present

## 2015-02-26 DIAGNOSIS — M5416 Radiculopathy, lumbar region: Secondary | ICD-10-CM

## 2015-02-26 DIAGNOSIS — Z880 Allergy status to penicillin: Secondary | ICD-10-CM | POA: Insufficient documentation

## 2015-02-26 DIAGNOSIS — M79605 Pain in left leg: Secondary | ICD-10-CM | POA: Insufficient documentation

## 2015-02-26 DIAGNOSIS — M4806 Spinal stenosis, lumbar region: Secondary | ICD-10-CM | POA: Diagnosis not present

## 2015-02-26 DIAGNOSIS — M47816 Spondylosis without myelopathy or radiculopathy, lumbar region: Secondary | ICD-10-CM | POA: Insufficient documentation

## 2015-02-26 DIAGNOSIS — R200 Anesthesia of skin: Secondary | ICD-10-CM

## 2015-02-26 DIAGNOSIS — M549 Dorsalgia, unspecified: Secondary | ICD-10-CM | POA: Insufficient documentation

## 2015-02-26 DIAGNOSIS — Z79899 Other long term (current) drug therapy: Secondary | ICD-10-CM

## 2015-02-26 DIAGNOSIS — R208 Other disturbances of skin sensation: Secondary | ICD-10-CM | POA: Diagnosis present

## 2015-02-26 DIAGNOSIS — I10 Essential (primary) hypertension: Secondary | ICD-10-CM | POA: Diagnosis not present

## 2015-02-26 DIAGNOSIS — N83201 Unspecified ovarian cyst, right side: Secondary | ICD-10-CM | POA: Insufficient documentation

## 2015-02-26 DIAGNOSIS — M4726 Other spondylosis with radiculopathy, lumbar region: Secondary | ICD-10-CM

## 2015-02-26 DIAGNOSIS — F1190 Opioid use, unspecified, uncomplicated: Secondary | ICD-10-CM

## 2015-02-26 DIAGNOSIS — Z7189 Other specified counseling: Secondary | ICD-10-CM

## 2015-02-26 DIAGNOSIS — Z5181 Encounter for therapeutic drug level monitoring: Secondary | ICD-10-CM

## 2015-02-26 DIAGNOSIS — M5136 Other intervertebral disc degeneration, lumbar region: Secondary | ICD-10-CM

## 2015-02-26 DIAGNOSIS — Z79891 Long term (current) use of opiate analgesic: Secondary | ICD-10-CM | POA: Diagnosis not present

## 2015-02-26 DIAGNOSIS — G8929 Other chronic pain: Secondary | ICD-10-CM | POA: Diagnosis not present

## 2015-02-26 DIAGNOSIS — N840 Polyp of corpus uteri: Secondary | ICD-10-CM | POA: Diagnosis not present

## 2015-02-26 DIAGNOSIS — M48061 Spinal stenosis, lumbar region without neurogenic claudication: Secondary | ICD-10-CM | POA: Insufficient documentation

## 2015-02-26 DIAGNOSIS — E041 Nontoxic single thyroid nodule: Secondary | ICD-10-CM | POA: Diagnosis not present

## 2015-02-26 HISTORY — DX: Other intervertebral disc displacement, lumbar region: M51.26

## 2015-02-26 HISTORY — DX: Other intervertebral disc degeneration, lumbar region: M51.36

## 2015-02-26 HISTORY — DX: Other intervertebral disc degeneration, lumbar region without mention of lumbar back pain or lower extremity pain: M51.369

## 2015-02-26 MED ORDER — HYDROCODONE-ACETAMINOPHEN 5-325 MG PO TABS: 1.0000 | ORAL_TABLET | Freq: Two times a day (BID) | ORAL | Status: DC | PRN

## 2015-02-26 NOTE — Progress Notes (Signed)
Patient is here for medication refill.  Reports new pain in both arms that was caused from positioning in last GYN surgery.  Numbness and tingling experienced in 3rd digit.   Hydrocodone-acetaminophen count qty 30

## 2015-02-26 NOTE — Progress Notes (Signed)
Patient's Name: Debra Myers MRN: [id number] DOB: 12/08/1965 DOS: 02/26/2015  Primary Reason(s) for Visit: Encounter for Medication Management CC: Back Pain   HPI:    Debra Myers is a 50 y.o. year old, female patient, who returns today as an established patient. She has GOITER, NONTOXIC MULTINODULAR; Diabetes (HCC); POLYCYSTIC OVARIES; HYPERTRIGLYCERIDEMIA; METABOLIC SYNDROME X; OBESITY; Anxiety state; Allergic rhinitis; Asthma; HIRSUTISM; Generalized anxiety disorder; Hypertension; Overweight(278.02); DUB (dysfunctional uterine bleeding); Endometrial polyp; GERD (gastroesophageal reflux disease); Ovarian cyst, right; IUD (intrauterine device) in place; Chronic pain syndrome; Opiate use; Uncomplicated opioid dependence (HCC); Long term prescription opiate use; Chronic pain; Long term current use of opiate analgesic; Encounter for therapeutic drug level monitoring; Encounter for chronic pain management; Numbness of upper extremity; Radiculitis involving upper extremity; Chronic low back pain; Lumbar spondylosis; Chronic pain of left lower extremity; Chronic radicular lumbar pain (Left); Lumbar 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 Her primarily concern today is the Back Pain     The patient returns to the clinics today for pharmacological management of her chronic pain. Recently she had some gynecological surgery which she indicates she did well with. However, she has a new problem with numbness in her thumb and ring finger, which apparently developed immediately after surgery. This problem seems to be bilateral. She also admits to neck pain. This appears to be a radiculitis which at this point it is improving. However, we will go in and order an x-ray of the cervical spine to ambulate. She did continue or worsen, we'll consider MRI.  Today's Pain Score: 3  (last pain med this morning around 0800), clinically she looks  like a 1-2/10. Reported level of pain is incompatible with clinical obrservations. This may be secondary to a possible lack of understanding on how the pain scale works. Pain Type: Chronic pain Pain Location: Back Pain Orientation: Left, Lower Pain Descriptors / Indicators: Aching, Cramping, Sore, Constant Pain Frequency: Constant (severity varies)  Date of Last Visit: 11/28/14 Service Provided on Last Visit: Med Refill  Pharmacotherapy Review:   Side-effects or Adverse reactions: None reported Effectiveness: Described as relatively effective, allowing for increase in activities of daily living (ADL) Onset of action: Within expected pharmacological parameters Duration of action: Within normal limits for medication Peak effect: Timing and results are as within normal expected parameters [place] PMP: Compliant with practice rules and regulations UDS Results: No UDS available, at this time UDS Interpretation: No UDS available, at this time Medication Assessment Form: Reviewed. Patient indicates being compliant with therapy Treatment compliance: Compliant Substance Use Disorder (SUD) Risk Level: Low Pharmacologic Plan: Continue therapy as is  Lab Work: Inflammation Markers No results found for: ESRSEDRATE, CRP  Renal Function Lab Results  Component Value Date   BUN 11 01/13/2015   CREATININE 0.75 01/13/2015   GFRAA >60 09/06/2014   GFRNONAA >60 09/06/2014    Hepatic Function Lab Results  Component Value Date   AST 16 01/13/2015   ALT 16 01/13/2015   ALBUMIN 3.9 01/13/2015    Electrolytes Lab Results  Component Value Date   NA 137 01/13/2015   K 4.3 01/13/2015   CL 105 01/13/2015   CALCIUM 8.5 01/13/2015    Illicit Drugs No results found for: THCU, COCAINSCRNUR, PCPSCRNUR, MDMA, AMPHETMU, METHADONE, ETOH   Allergies:  Debra Myers is allergic to oxycodone; penicillins; and sulfonamide derivatives.  Meds:  The patient has a current medication list which includes the  following prescription(s): albuterol,  bupropion, fluticasone, hydrocodone-acetaminophen, ibuprofen, lamotrigine, lorazepam, metaxalone, metformin, montelukast, omeprazole, paroxetine, ra cetirizine, spironolactone, vyvanse, hydrocodone-acetaminophen, and hydrocodone-acetaminophen. Requested Prescriptions   Signed Prescriptions Disp Refills  . HYDROcodone-acetaminophen (NORCO/VICODIN) 5-325 MG tablet 60 tablet 0    Sig: Take 1 tablet by mouth 2 (two) times daily as needed for moderate pain.  Marland Kitchen. HYDROcodone-acetaminophen (NORCO) 5-325 MG tablet 60 tablet 0    Sig: Take 1 tablet by mouth 2 (two) times daily as needed for moderate pain or severe pain.  Marland Kitchen. HYDROcodone-acetaminophen (NORCO) 5-325 MG tablet 60 tablet 0    Sig: Take 1 tablet by mouth 2 (two) times daily as needed for moderate pain.    ROS:  Constitutional: Afebrile, no chills, well hydrated and well nourished Gastrointestinal: negative Musculoskeletal:negative Neurological: negative Behavioral/Psych: negative  PFSH:  Medical:  Debra Myers  has a past medical history of Depression; Vaginal delivery; Elective abortion; Asthma; Diabetes mellitus; Anxiety; Hypertension; Degenerative disc disease; Degenerative lumbar disc; and GERD (gastroesophageal reflux disease). Family: family history includes Asthma in her maternal grandmother; Diabetes in her father; Hypertension in her father. Surgical:  has past surgical history that includes Cholecystectomy (2001); Lapband (04/2012); Wisdom tooth extraction; and Dilatation & curettage/hysteroscopy with myosure (N/A, 09/10/2014). Tobacco:  reports that she has never smoked. She has never used smokeless tobacco. Alcohol:  reports that she does not drink alcohol. Drug:  reports that she does not use illicit drugs.  Physical Exam:  Vitals:  Today's Vitals   02/26/15 0838  BP: 138/63  Pulse: 70  Temp: 98.4 F (36.9 C)  TempSrc: Oral  Resp: 16  Height: 5\' 4"  (1.626 m)  Weight: 268 lb (121.564  kg)  SpO2: 100%  PainSc: 3   Calculated BMI: Body mass index is 45.98 kg/(m^2). General appearance: alert, cooperative, appears stated age, no distress and morbidly obese Eyes: PERLA Respiratory: No evidence respiratory distress, no audible rales or ronchi and no use of accessory muscles of respiration Neck: no adenopathy, no carotid bruit, no JVD, supple, symmetrical, trachea midline and thyroid not enlarged, symmetric, no tenderness/mass/nodules  Cervical Spine ROM: Adequate for flexion, extension, rotation, and lateral bending Palpation: No palpable trigger points  Upper Extremities ROM: Adequate bilaterally Strength: 5/5 for all flexors and extensors of the upper extremity, bilaterally Pulses: Palpable bilaterally Neurologic: No allodynia, No hyperesthesia, No hyperpathia and No sensory abnormalities  Lumbar Spine ROM: Adequate for flexion, extension, rotation, and lateral bending Palpation: No palpable trigger points Lumbar Hyperextension and rotation: Non-contributory Patrick's Maneuver: Non-contributory  Lower Extremities ROM: Adequate bilaterally Strength: 5/5 for all flexors and extensors of the lower extremity, bilaterally Pulses: Palpable bilaterally Neurologic: No allodynia, No hyperesthesia, No hyperpathia, No sensory abnormalities and No antalgic gait or posture  Assessment:  Encounter Diagnosis:  Primary Diagnosis: Chronic pain [G89.29]  Plan:   Interventional Therapies: PRN procedure: 1. Left L4-5 lumbar epidural steroid injection under fluoroscopic guidance for lower extremity pain (no sedation)  2. Bilateral lumbar facet block under fluoroscopic guidance, with IV sedation. (For low back pain)    Zella BallRobin was seen today for back pain.  Diagnoses and all orders for this visit:  Chronic pain -     HYDROcodone-acetaminophen (NORCO/VICODIN) 5-325 MG tablet; Take 1 tablet by mouth 2 (two) times daily as needed for moderate pain. -     HYDROcodone-acetaminophen  (NORCO) 5-325 MG tablet; Take 1 tablet by mouth 2 (two) times daily as needed for moderate pain or severe pain. -     HYDROcodone-acetaminophen (NORCO) 5-325 MG tablet; Take 1 tablet  by mouth 2 (two) times daily as needed for moderate pain. -     C-reactive protein -     Magnesium -     Sedimentation rate -     Vitamin B12 -     Vitamin D pnl(25-hydrxy+1,25-dihy)-bld  Long term current use of opiate analgesic -     Drugs of abuse screen w/o alc, rtn urine-sln  Encounter for therapeutic drug level monitoring  Encounter for chronic pain management  Long term prescription opiate use  Opiate use  Uncomplicated opioid dependence (HCC)  Chronic pain syndrome  Numbness of upper extremity -     DG Cervical Spine Complete; Future  Radiculitis involving upper extremity -     DG Cervical Spine Complete; Future  Chronic low back pain  Osteoarthritis of spine with radiculopathy, lumbar region  Chronic pain of left lower extremity  Chronic radicular lumbar pain (Left) -     LUMBAR EPIDURAL STEROID INJECTION; Standing  Lumbar foraminal stenosis (severe left L3-4) -     LUMBAR EPIDURAL STEROID INJECTION; Standing  Bulging lumbar disc (L3-4)  Lumbar facet syndrome (Bilateral) -     LUMBAR FACET(MEDIAL BRANCH NERVE BLOCK) MBNB; Standing     There are no Patient Instructions on file for this visit. Medications discontinued today:  Medications Discontinued During This Encounter  Medication Reason  . Cholecalciferol (VITAMIN D3) 5000 UNITS CAPS Error  . ciprofloxacin (CIPRO) 500 MG tablet Error  . fluconazole (DIFLUCAN) 150 MG tablet Error  . naproxen sodium (ALEVE) 220 MG tablet Error  . phenazopyridine (PYRIDIUM) 200 MG tablet Error  . VYVANSE 20 MG capsule Error  . HYDROcodone-acetaminophen (NORCO/VICODIN) 5-325 MG tablet Reorder   Medications administered today:  Ms. Prell had no medications administered during this visit.  Primary Care Physician: Ruthe Mannan,  MD Location: [facility] Note by: Sydnee Levans. Laban Emperor, M.D, DABA, DABAPM, DABPM, DABIPP, FIPP

## 2015-03-03 LAB — TOXASSURE SELECT 13 (MW), URINE: PDF: 0

## 2015-03-04 ENCOUNTER — Encounter: Payer: Self-pay | Admitting: Pain Medicine

## 2015-03-04 ENCOUNTER — Other Ambulatory Visit: Payer: Self-pay | Admitting: Pain Medicine

## 2015-03-04 DIAGNOSIS — G8929 Other chronic pain: Secondary | ICD-10-CM | POA: Insufficient documentation

## 2015-03-04 DIAGNOSIS — M5412 Radiculopathy, cervical region: Secondary | ICD-10-CM

## 2015-03-04 DIAGNOSIS — M4802 Spinal stenosis, cervical region: Secondary | ICD-10-CM | POA: Insufficient documentation

## 2015-03-04 DIAGNOSIS — M47812 Spondylosis without myelopathy or radiculopathy, cervical region: Secondary | ICD-10-CM

## 2015-03-04 DIAGNOSIS — M4310 Spondylolisthesis, site unspecified: Secondary | ICD-10-CM | POA: Insufficient documentation

## 2015-03-04 DIAGNOSIS — M542 Cervicalgia: Secondary | ICD-10-CM

## 2015-03-04 NOTE — Progress Notes (Signed)
Quick Note:  Abnormal findings detected on recent study. IMPRESSION: 1. There is no cervical spine compression fracture nor other acute bony abnormality. 2. Moderate degenerative disc disease centered at C5-6 with milder changes at C6-7. Grade 1 anterolisthesis of C4 with respect to C5 likely on the basis of degenerative disc disease. 3. Moderate multilevel bony encroachment upon the neural foramina bilaterally. Given these findings and the patient's symptoms, cervical spine MRI may be useful. Please arrange for cervical MRI to be done. I have already placed and ordering the electronic medical record. ______

## 2015-04-11 ENCOUNTER — Other Ambulatory Visit: Payer: PRIVATE HEALTH INSURANCE

## 2015-04-11 ENCOUNTER — Ambulatory Visit: Payer: PRIVATE HEALTH INSURANCE | Admitting: Gynecology

## 2015-05-02 ENCOUNTER — Encounter: Payer: PRIVATE HEALTH INSURANCE | Admitting: Gynecology

## 2015-05-05 ENCOUNTER — Other Ambulatory Visit: Payer: Self-pay | Admitting: Pain Medicine

## 2015-05-13 ENCOUNTER — Telehealth: Payer: Self-pay | Admitting: Pain Medicine

## 2015-05-13 NOTE — Telephone Encounter (Signed)
She has script for Hydrocodone but not the skelaxin, could you check if she was supposed to get refill on skelaxin, she has appt on 05-26-15

## 2015-05-13 NOTE — Telephone Encounter (Signed)
Was last written on 02-26-15, with no refills. Will ask Dr. Laban EmperorNaveira if he will write new prescription.

## 2015-05-15 ENCOUNTER — Other Ambulatory Visit: Payer: Self-pay

## 2015-05-15 MED ORDER — METAXALONE 800 MG PO TABS: 800.0000 mg | ORAL_TABLET | Freq: Three times a day (TID) | ORAL | Status: DC

## 2015-05-15 NOTE — Telephone Encounter (Signed)
Script sent in for 1 month per Dr Laban EmperorNaveira

## 2015-05-26 ENCOUNTER — Encounter: Payer: Self-pay | Admitting: Pain Medicine

## 2015-05-26 ENCOUNTER — Ambulatory Visit: Payer: Managed Care, Other (non HMO) | Attending: Pain Medicine | Admitting: Pain Medicine

## 2015-05-26 VITALS — BP 129/78 | HR 81 | Temp 98.4°F | Resp 16 | Ht 64.0 in | Wt 250.0 lb

## 2015-05-26 DIAGNOSIS — E8881 Metabolic syndrome: Secondary | ICD-10-CM | POA: Diagnosis not present

## 2015-05-26 DIAGNOSIS — Z79891 Long term (current) use of opiate analgesic: Secondary | ICD-10-CM | POA: Diagnosis not present

## 2015-05-26 DIAGNOSIS — J45909 Unspecified asthma, uncomplicated: Secondary | ICD-10-CM | POA: Diagnosis not present

## 2015-05-26 DIAGNOSIS — E041 Nontoxic single thyroid nodule: Secondary | ICD-10-CM | POA: Insufficient documentation

## 2015-05-26 DIAGNOSIS — M47812 Spondylosis without myelopathy or radiculopathy, cervical region: Secondary | ICD-10-CM | POA: Insufficient documentation

## 2015-05-26 DIAGNOSIS — M4806 Spinal stenosis, lumbar region: Secondary | ICD-10-CM | POA: Insufficient documentation

## 2015-05-26 DIAGNOSIS — M47816 Spondylosis without myelopathy or radiculopathy, lumbar region: Secondary | ICD-10-CM | POA: Insufficient documentation

## 2015-05-26 DIAGNOSIS — M4692 Unspecified inflammatory spondylopathy, cervical region: Secondary | ICD-10-CM | POA: Insufficient documentation

## 2015-05-26 DIAGNOSIS — Z9049 Acquired absence of other specified parts of digestive tract: Secondary | ICD-10-CM | POA: Insufficient documentation

## 2015-05-26 DIAGNOSIS — M4316 Spondylolisthesis, lumbar region: Secondary | ICD-10-CM | POA: Diagnosis not present

## 2015-05-26 DIAGNOSIS — E669 Obesity, unspecified: Secondary | ICD-10-CM | POA: Diagnosis not present

## 2015-05-26 DIAGNOSIS — G8929 Other chronic pain: Secondary | ICD-10-CM | POA: Insufficient documentation

## 2015-05-26 DIAGNOSIS — F329 Major depressive disorder, single episode, unspecified: Secondary | ICD-10-CM | POA: Diagnosis not present

## 2015-05-26 DIAGNOSIS — I10 Essential (primary) hypertension: Secondary | ICD-10-CM | POA: Diagnosis not present

## 2015-05-26 DIAGNOSIS — Z5181 Encounter for therapeutic drug level monitoring: Secondary | ICD-10-CM | POA: Diagnosis not present

## 2015-05-26 DIAGNOSIS — F411 Generalized anxiety disorder: Secondary | ICD-10-CM | POA: Diagnosis not present

## 2015-05-26 DIAGNOSIS — M5126 Other intervertebral disc displacement, lumbar region: Secondary | ICD-10-CM | POA: Diagnosis not present

## 2015-05-26 DIAGNOSIS — K219 Gastro-esophageal reflux disease without esophagitis: Secondary | ICD-10-CM | POA: Diagnosis not present

## 2015-05-26 DIAGNOSIS — J309 Allergic rhinitis, unspecified: Secondary | ICD-10-CM | POA: Insufficient documentation

## 2015-05-26 DIAGNOSIS — L680 Hirsutism: Secondary | ICD-10-CM | POA: Insufficient documentation

## 2015-05-26 DIAGNOSIS — E781 Pure hyperglyceridemia: Secondary | ICD-10-CM | POA: Diagnosis not present

## 2015-05-26 DIAGNOSIS — N938 Other specified abnormal uterine and vaginal bleeding: Secondary | ICD-10-CM | POA: Insufficient documentation

## 2015-05-26 DIAGNOSIS — E119 Type 2 diabetes mellitus without complications: Secondary | ICD-10-CM | POA: Diagnosis not present

## 2015-05-26 DIAGNOSIS — M25559 Pain in unspecified hip: Secondary | ICD-10-CM | POA: Diagnosis present

## 2015-05-26 DIAGNOSIS — M549 Dorsalgia, unspecified: Secondary | ICD-10-CM | POA: Diagnosis present

## 2015-05-26 MED ORDER — HYDROCODONE-ACETAMINOPHEN 5-325 MG PO TABS: 1.0000 | ORAL_TABLET | Freq: Two times a day (BID) | ORAL | Status: DC | PRN

## 2015-05-26 NOTE — Progress Notes (Signed)
Patient's Name: Debra Myers Patient type: Established  MRN: [id number] Service setting: Ambulatory outpatient  DOB: [birth year]   DOS: 05/26/2015    Primary Reason(s) for Visit: Encounter for prescription drug management (Level of risk: moderate) CC: Back Pain and Hip Pain   HPI  Debra Myers is a 50 y.o. year old, female patient, who returns today as an established patient. She has GOITER, NONTOXIC MULTINODULAR; Diabetes (HCC); POLYCYSTIC OVARIES; HYPERTRIGLYCERIDEMIA; METABOLIC SYNDROME X; OBESITY; Anxiety state; Allergic rhinitis; Asthma; HIRSUTISM; Generalized anxiety disorder; Hypertension; Overweight(278.02); DUB (dysfunctional uterine bleeding); Endometrial polyp; GERD (gastroesophageal reflux disease); Ovarian cyst, right; IUD (intrauterine device) in place; Chronic pain syndrome; Opiate use; Uncomplicated opioid dependence (HCC); Long term prescription opiate use; Chronic pain; Long term current use of opiate analgesic; Encounter for therapeutic drug level monitoring; Encounter for chronic pain management; Numbness of upper extremity; Radiculitis involving upper extremity; Chronic low back pain; Lumbar spondylosis; Chronic pain of left lower extremity; Chronic radicular lumbar pain (Left); 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 Her primarily concern today is the Back Pain and Hip Pain   Pain Assessment: Self-Reported Pain Score: 1  Reported level is compatible with observation Pain Type: Chronic pain Pain Location: Back Pain Orientation: Left, Lower Pain Descriptors / Indicators: Aching, Constant Pain Frequency: Constant  The patient returns to the clinics today for pharmacological management of her chronic pain.  Date of  Last Visit: 02/26/15 Service Provided on Last Visit: Med Refill  Controlled Substance Pharmacotherapy Assessment  Analgesic: Hydrocodone/APAP 5/325 2 tablets per day (10 mg/day) Pill Count:  Hydrocodone 5//325mg   Count  is 8.5/60 filled 04/26/2015 MME/day: 10 mg/day.  Pharmacokinetics: Onset of action (Liberation/Absorption): alert, cooperative, appears stated age, no distress and morbidly obese Time to Peak effect (Distribution): Timing and results are as within normal expected parameters Duration of action (Metabolism/Excretion): Within normal limits for medication Pharmacodynamics: Analgesic Effect: More than 50% Activity Facilitation: Medication(s) allow patient to sit, stand, walk, and do the basic ADLs Perceived Effectiveness: Described as relatively effective, allowing for increase in activities of daily living (ADL) Side-effects or Adverse reactions: None reported Monitoring: [place] PMP: Online review of the past 1168-month period conducted. Compliant with practice rules and regulations UDS Results/interpretation:  Last UDS done on 02/26/2015 came back with unexpected results as it detected levels of amphetamine and lorazepam for medications that were not declared. This appears to be an issue with the recorded medications as the patient has documented in the electronic medical record that she takes Vyvanse and Ativan. The patient was informed of the CDC guidelines and recommendations. Medication Assessment Form: Reviewed. Patient indicates being compliant with therapy Treatment compliance: Compliant Risk Assessment: Aberrant Behavior: None observed today Substance Use Disorder (SUD) Risk Level: Low-to-moderate Risk of opioid abuse or dependence: 0.7-3.0% with doses ? 36 MME/day and 6.1-26% with doses ? 120 MME/day. Opioid Risk Tool (ORT) Score: Total Score: 5 Moderate Risk for SUD (Score between 4-7) Depression Scale Score: PHQ-2: PHQ-2 Total Score: 0 No depression (0) PHQ-9: PHQ-9  Total Score: 0 No depression (0-4)  Pharmacologic Plan: No change in therapy, at this time  Laboratory Chemistry  Inflammation Markers No results found for: ESRSEDRATE, CRP  Renal Function Lab Results  Component Value Date   BUN 11 01/13/2015   CREATININE 0.75 01/13/2015   GFRAA >60  09/06/2014   GFRNONAA >60 09/06/2014    Hepatic Function Lab Results  Component Value Date   AST 16 01/13/2015   ALT 16 01/13/2015   ALBUMIN 3.9 01/13/2015    Electrolytes Lab Results  Component Value Date   NA 137 01/13/2015   K 4.3 01/13/2015   CL 105 01/13/2015   CALCIUM 8.5 01/13/2015    Pain Modulating Vitamins Lab Results  Component Value Date   VD25OH 44 05/18/2013    Coagulation Parameters No results found for: INR, LABPROT  Note: I personally reviewed the above data. Results shared with patient.  Meds  The patient has a current medication list which includes the following prescription(s): albuterol, bupropion, fluticasone, hydrocodone-acetaminophen, hydrocodone-acetaminophen, hydrocodone-acetaminophen, ibuprofen, lamotrigine, lorazepam, metaxalone, metformin, montelukast, omeprazole, paroxetine, ra cetirizine, spironolactone, vyvanse, and vyvanse.  Current Outpatient Prescriptions on File Prior to Visit  Medication Sig  . albuterol (PROVENTIL HFA;VENTOLIN HFA) 108 (90 BASE) MCG/ACT inhaler Inhale 2 puffs into the lungs every 6 (six) hours as needed for wheezing or shortness of breath.  Marland Kitchen buPROPion (WELLBUTRIN XL) 300 MG 24 hr tablet Take 300 mg by mouth daily.  . fluticasone (FLONASE) 50 MCG/ACT nasal spray Place 2 sprays into both nostrils daily.  Marland Kitchen ibuprofen (ADVIL,MOTRIN) 200 MG tablet Take 400 mg by mouth every 4 (four) hours as needed.  . lamoTRIgine (LAMICTAL) 200 MG tablet Take 300 mg by mouth daily. Take 1 and 1/2 tablets daily.  Marland Kitchen LORazepam (ATIVAN) 1 MG tablet Take 1 mg by mouth at bedtime as needed for sleep.  . metaxalone (SKELAXIN) 800 MG tablet Take 1 tablet  (800 mg total) by mouth 3 (three) times daily.  . metFORMIN (GLUCOPHAGE) 500 MG tablet take 1 tablet by mouth once daily WITH BREAKFAST  . montelukast (SINGULAIR) 10 MG tablet Take 1 tablet (10 mg total) by mouth at bedtime.  Marland Kitchen omeprazole (PRILOSEC) 20 MG capsule Take 1 capsule (20 mg total) by mouth daily.  Marland Kitchen PARoxetine (PAXIL-CR) 25 MG 24 hr tablet Take 50 mg by mouth daily.  Marland Kitchen RA CETIRIZINE 10 MG tablet Take 10 mg by mouth daily.  Marland Kitchen spironolactone (ALDACTONE) 25 MG tablet take 1 tablet by mouth once daily  . VYVANSE 30 MG capsule Take 30 mg by mouth daily. Reported on 05/26/2015   No current facility-administered medications on file prior to visit.    ROS  Constitutional: Afebrile, no chills, well hydrated and well nourished Gastrointestinal: No upper or lower GI bleeding, no nausea, no vomiting and no acute GI distress Musculoskeletal: No acute joint swelling or redness, no acute loss of range of motion and no acute onset weakness Neurological: Denies any acute onset apraxia, no episodes of paralysis, no acute loss of coordination, no acute loss of consciousness and no acute onset aphasia, dysarthria, agnosia, or amnesia  Allergies  Debra Myers is allergic to oxycodone; penicillins; and sulfonamide derivatives.  PFSH  Medical:  Debra Myers  has a past medical history of Depression; Vaginal delivery; Elective abortion; Asthma; Diabetes mellitus; Anxiety; Hypertension; Degenerative disc disease; Degenerative lumbar disc; and GERD (gastroesophageal reflux disease). Family: family history includes Asthma in her maternal grandmother; Diabetes in her father; Hypertension in her father and mother. Surgical:  has past surgical history that includes Cholecystectomy (2001); Lapband (04/2012); Wisdom tooth extraction; and Dilatation & curettage/hysteroscopy with myosure (N/A, 09/10/2014). Tobacco:  reports that she has never smoked. She has never used smokeless tobacco. Alcohol:  reports that she does  not drink alcohol. Drug:  reports that she does not  use illicit drugs.  Physical Examination  Constitutional Vitals:  Today's Vitals   05/26/15 0759 05/26/15 0801  BP: 129/78   Pulse: 81   Temp: 98.4 F (36.9 C)   TempSrc: Oral   Resp: 16   Height:  (1.626 m)   Weight: 250 lb (113.399 kg)   SpO2: 98%   PainSc: 1  1   PainLoc: Back     Calculated BMI: Body mass index is 42.89 kg/(m^2). Extreme obesity (Class III) (>40 kg/m2) - 254% higher incidence of chronic pain General appearance: alert, cooperative, appears stated age, no distress and morbidly obese Eyes: PERLA Respiratory: No evidence respiratory distress, no audible rales or ronchi and no use of accessory muscles of respiration  Cervical Spine Inspection: Normal anatomy Alignment: Symetrical AROM: Adequate  Upper Extremities Right  Left  Inspection: No gross anomalies detected  Inspection: No gross anomalies detected  AROM: Adequate  AROM: Adequate  Sensory: Normal  Sensory: Normal  Motor: Unremarkable Normal strength (5/5)  Motor: Unremarkable Normal strength (5/5)   Thoracic Spine Inspection: No gross anomalies detected Alignment: Symetrical AROM: Adequate  Lumbar Spine Inspection: No gross anomalies detected Alignment: Symetrical AROM: Adequate  Gait: WNL  Lower Extremities Right  Left  Inspection: No gross anomalies detected  Inspection: No gross anomalies detected  AROM: Adequate  AROM: Adequate  Sensory:  Normal  Sensory:  Normal  Motor: Unremarkable       Motor: Unremarkable        Assessment & Plan  Primary Diagnosis & Pertinent Problem List: The primary encounter diagnosis was Chronic pain. Diagnoses of Encounter for therapeutic drug level monitoring and Long term current use of opiate analgesic were also pertinent to this visit.  Visit Diagnosis: 1. Chronic pain   2. Encounter for therapeutic drug level monitoring   3. Long term current use of opiate analgesic     Problem-specific  Plan(s): No problem-specific assessment & plan notes found for this encounter.   Plan of Care  Pharmacotherapy (Medications Ordered): Meds ordered this encounter  Medications  . HYDROcodone-acetaminophen (NORCO) 5-325 MG tablet    Sig: Take 1 tablet by mouth 2 (two) times daily as needed for moderate pain or severe pain.    Dispense:  60 tablet    Refill:  0    Do not place this medication, or any other prescription from our practice, on "Automatic Refill". Patient may have prescription filled one day early if pharmacy is closed on scheduled refill date. Do not fill until: 06/05/15 To last until: 07/05/15  . HYDROcodone-acetaminophen (NORCO) 5-325 MG tablet    Sig: Take 1 tablet by mouth 2 (two) times daily as needed for moderate pain or severe pain.    Dispense:  60 tablet    Refill:  0    Do not place this medication, or any other prescription from our practice, on "Automatic Refill". Patient may have prescription filled one day early if pharmacy is closed on scheduled refill date. Do not fill until: 07/05/15 To last until: 08/04/15  . HYDROcodone-acetaminophen (NORCO/VICODIN) 5-325 MG tablet    Sig: Take 1 tablet by mouth 2 (two) times daily as needed for moderate pain or severe pain.    Dispense:  60 tablet    Refill:  0    Do not place this medication, or any other prescription from our practice, on "Automatic Refill". Patient may have prescription filled one day early if pharmacy is closed on scheduled refill date. Do not fill until: 08/04/15 To  last until: 09/03/15    [facility] & Procedure Ordered: Orders Placed This Encounter  Procedures  . ToxASSURE Select 13 (MW), Urine    Volume: 30 ml(s). Minimum 3 ml of urine is needed. Document temperature of fresh sample. Indications: Long term (current) use of opiate analgesic (Z61.096)    Imaging Ordered: None  Interventional Therapies: Scheduled:  None at this time. Considering:  None at this time. PRN Procedures:  None  at this time.   Referral(s) or Consult(s): None at this time.  Medications administered during this visit: Debra Myers had no medications administered during this visit.  Future Appointments Date Time Provider Department Center  06/27/2015 11:30 AM Ok Edwards, MD [facility]  08/21/2015 2:00 PM Delano Metz, MD [facility] None    Primary Care Physician: Ruthe Mannan, MD Location: [facility] Note by: Sydnee Levans. Laban Emperor, M.D, DABA, DABAPM, DABPM, DABIPP, FIPP  Pain Score Disclaimer: We use the NRS-11 scale. This is a self-reported, subjective measurement of pain severity with only modest accuracy. It is used primarily to identify changes within a particular patient. It must be understood that outpatient pain scales are significantly less accurate that those used for research, where they can be applied under ideal controlled circumstances with minimal exposure to variables. In reality, the score is likely to be a combination of pain intensity and pain affect, where pain affect describes the degree of emotional arousal or changes in action readiness caused by the sensory experience of pain. Factors such as social and work situation, setting, emotional state, anxiety levels, expectation, and prior pain experience may influence pain perception and show large inter-individual differences that may also be affected by time variables.

## 2015-05-26 NOTE — Progress Notes (Signed)
Safety precautions to be maintained throughout the outpatient stay will include: orient to surroundings, keep bed in low position, maintain call bell within reach at all times, provide assistance with transfer out of bed and ambulation.  Hydrocodone 5//325mg   Count  is 8.5/60 filled 04/26/2015

## 2015-05-29 LAB — TOXASSURE SELECT 13 (MW), URINE: PDF: 0

## 2015-06-11 ENCOUNTER — Emergency Department: Payer: Managed Care, Other (non HMO)

## 2015-06-11 ENCOUNTER — Observation Stay
Admission: EM | Admit: 2015-06-11 | Discharge: 2015-06-12 | Disposition: A | Discharge status: Home / Self Care | Payer: Managed Care, Other (non HMO) | Attending: Internal Medicine | Admitting: Internal Medicine

## 2015-06-11 DIAGNOSIS — Z880 Allergy status to penicillin: Secondary | ICD-10-CM | POA: Diagnosis not present

## 2015-06-11 DIAGNOSIS — E785 Hyperlipidemia, unspecified: Secondary | ICD-10-CM | POA: Diagnosis not present

## 2015-06-11 DIAGNOSIS — E119 Type 2 diabetes mellitus without complications: Secondary | ICD-10-CM | POA: Insufficient documentation

## 2015-06-11 DIAGNOSIS — F411 Generalized anxiety disorder: Secondary | ICD-10-CM | POA: Diagnosis present

## 2015-06-11 DIAGNOSIS — Z7951 Long term (current) use of inhaled steroids: Secondary | ICD-10-CM | POA: Insufficient documentation

## 2015-06-11 DIAGNOSIS — I639 Cerebral infarction, unspecified: Secondary | ICD-10-CM

## 2015-06-11 DIAGNOSIS — I10 Essential (primary) hypertension: Secondary | ICD-10-CM | POA: Diagnosis present

## 2015-06-11 DIAGNOSIS — K219 Gastro-esophageal reflux disease without esophagitis: Secondary | ICD-10-CM | POA: Diagnosis not present

## 2015-06-11 DIAGNOSIS — Z9889 Other specified postprocedural states: Secondary | ICD-10-CM | POA: Diagnosis not present

## 2015-06-11 DIAGNOSIS — R7303 Prediabetes: Secondary | ICD-10-CM

## 2015-06-11 DIAGNOSIS — Z9884 Bariatric surgery status: Secondary | ICD-10-CM | POA: Insufficient documentation

## 2015-06-11 DIAGNOSIS — R079 Chest pain, unspecified: Secondary | ICD-10-CM | POA: Diagnosis not present

## 2015-06-11 DIAGNOSIS — Z885 Allergy status to narcotic agent status: Secondary | ICD-10-CM | POA: Insufficient documentation

## 2015-06-11 DIAGNOSIS — M5136 Other intervertebral disc degeneration, lumbar region: Secondary | ICD-10-CM | POA: Diagnosis not present

## 2015-06-11 DIAGNOSIS — G894 Chronic pain syndrome: Secondary | ICD-10-CM | POA: Insufficient documentation

## 2015-06-11 DIAGNOSIS — Z9049 Acquired absence of other specified parts of digestive tract: Secondary | ICD-10-CM | POA: Insufficient documentation

## 2015-06-11 DIAGNOSIS — Z8673 Personal history of transient ischemic attack (TIA), and cerebral infarction without residual deficits: Secondary | ICD-10-CM

## 2015-06-11 DIAGNOSIS — Z79891 Long term (current) use of opiate analgesic: Secondary | ICD-10-CM | POA: Diagnosis not present

## 2015-06-11 DIAGNOSIS — J45909 Unspecified asthma, uncomplicated: Secondary | ICD-10-CM | POA: Diagnosis not present

## 2015-06-11 DIAGNOSIS — R531 Weakness: Secondary | ICD-10-CM | POA: Insufficient documentation

## 2015-06-11 DIAGNOSIS — G459 Transient cerebral ischemic attack, unspecified: Secondary | ICD-10-CM | POA: Diagnosis not present

## 2015-06-11 DIAGNOSIS — M549 Dorsalgia, unspecified: Secondary | ICD-10-CM | POA: Diagnosis not present

## 2015-06-11 DIAGNOSIS — R200 Anesthesia of skin: Secondary | ICD-10-CM | POA: Insufficient documentation

## 2015-06-11 DIAGNOSIS — F329 Major depressive disorder, single episode, unspecified: Secondary | ICD-10-CM | POA: Insufficient documentation

## 2015-06-11 DIAGNOSIS — Z79899 Other long term (current) drug therapy: Secondary | ICD-10-CM | POA: Insufficient documentation

## 2015-06-11 DIAGNOSIS — Z7984 Long term (current) use of oral hypoglycemic drugs: Secondary | ICD-10-CM | POA: Diagnosis not present

## 2015-06-11 DIAGNOSIS — R42 Dizziness and giddiness: Secondary | ICD-10-CM | POA: Diagnosis not present

## 2015-06-11 HISTORY — DX: Personal history of transient ischemic attack (TIA), and cerebral infarction without residual deficits: Z86.73

## 2015-06-11 HISTORY — DX: Cerebral infarction, unspecified: I63.9

## 2015-06-11 LAB — PROTIME-INR
INR: 1.1
Prothrombin Time: 14.4 seconds (ref 11.4–15.0)

## 2015-06-11 LAB — CBC WITH DIFFERENTIAL/PLATELET
BASOS ABS: 0.1 10*3/uL (ref 0–0.1)
Basophils Relative: 1 %
Eosinophils Absolute: 0.3 10*3/uL (ref 0–0.7)
Eosinophils Relative: 2 %
HEMATOCRIT: 39.6 % (ref 35.0–47.0)
Hemoglobin: 13.1 g/dL (ref 12.0–16.0)
LYMPHS ABS: 3.1 10*3/uL (ref 1.0–3.6)
LYMPHS PCT: 25 %
MCH: 27.6 pg (ref 26.0–34.0)
MCHC: 33 g/dL (ref 32.0–36.0)
MCV: 83.5 fL (ref 80.0–100.0)
MONO ABS: 0.6 10*3/uL (ref 0.2–0.9)
Monocytes Relative: 5 %
NEUTROS ABS: 8.3 10*3/uL — AB (ref 1.4–6.5)
Neutrophils Relative %: 67 %
Platelets: 287 10*3/uL (ref 150–440)
RBC: 4.74 MIL/uL (ref 3.80–5.20)
RDW: 14.5 % (ref 11.5–14.5)
WBC: 12.4 10*3/uL — ABNORMAL HIGH (ref 3.6–11.0)

## 2015-06-11 LAB — COMPREHENSIVE METABOLIC PANEL
ALT: 25 U/L (ref 14–54)
AST: 17 U/L (ref 15–41)
Albumin: 4.2 g/dL (ref 3.5–5.0)
Alkaline Phosphatase: 116 U/L (ref 38–126)
Anion gap: 9 (ref 5–15)
BUN: 12 mg/dL (ref 6–20)
CHLORIDE: 103 mmol/L (ref 101–111)
CO2: 26 mmol/L (ref 22–32)
CREATININE: 0.83 mg/dL (ref 0.44–1.00)
Calcium: 9 mg/dL (ref 8.9–10.3)
GFR calc Af Amer: >60 mL/min (ref >60)
Glucose, Bld: 85 mg/dL (ref 65–99)
Potassium: 4 mmol/L (ref 3.5–5.1)
Sodium: 138 mmol/L (ref 135–145)
Total Bilirubin: 0.5 mg/dL (ref 0.3–1.2)
Total Protein: 7.5 g/dL (ref 6.5–8.1)

## 2015-06-11 LAB — URINALYSIS COMPLETE WITH MICROSCOPIC (ARMC ONLY)
BILIRUBIN URINE: NEGATIVE
Bacteria, UA: NONE SEEN
GLUCOSE, UA: NEGATIVE mg/dL
Hgb urine dipstick: NEGATIVE
KETONES UR: NEGATIVE mg/dL
Leukocytes, UA: NEGATIVE
NITRITE: NEGATIVE
PROTEIN: NEGATIVE mg/dL
Specific Gravity, Urine: 1.023 (ref 1.005–1.030)
pH: 6 (ref 5.0–8.0)

## 2015-06-11 LAB — ETHANOL: Alcohol, Ethyl (B): <5 mg/dL (ref <5)

## 2015-06-11 LAB — APTT: APTT: 33 s (ref 24–36)

## 2015-06-11 LAB — TROPONIN I

## 2015-06-11 LAB — POCT PREGNANCY, URINE: Preg Test, Ur: NEGATIVE

## 2015-06-11 MED ORDER — ASPIRIN 325 MG PO TABS
325.0000 mg | ORAL_TABLET | Freq: Once | ORAL | Status: AC
  Administered 2015-06-11: 325 mg via ORAL
  Filled 2015-06-11: qty 1

## 2015-06-11 NOTE — ED Notes (Addendum)
Dr. Yao at bedside. 

## 2015-06-11 NOTE — ED Notes (Signed)
Spoke with Dr Mayford KnifeWilliams regarding pt's symptoms and orders obtained

## 2015-06-11 NOTE — ED Provider Notes (Signed)
CSN: [id number]     Arrival date & time 06/11/15  1948 History   First MD Initiated Contact with Patient 06/11/15 2018     Chief Complaint  Patient presents with  . Code Stroke     (Consider location/radiation/quality/duration/timing/severity/associated sxs/prior Treatment) The history is provided by the patient.  Debra Myers is a 50 y.o. female hx of DM, GERD, depression, chronic back pain here with L tongue and lip numbness, left arm and leg numbness. Symptoms started around 4 PM this afternoon. Her with left tongue and lower lip numbness. She also occasionally has left arm and leg numbness as well. She states that the numbness is improving. Denies any trouble walking or speech problems. Denies any trouble walking. Patient thought she was just anxious but the symptoms persisted so she came to the ER for evaluation. Code stroke was activated in triage. No hx of strokes, father had stroke age 50s.    Past Medical History  Diagnosis Date  . Depression   . Vaginal delivery     ONE NSVD  . Elective abortion     ONE  . Asthma     ALLERGY INDUCED  . Diabetes mellitus     TYPE 2  . Anxiety   . Hypertension   . Degenerative disc disease   . Degenerative lumbar disc   . GERD (gastroesophageal reflux disease)     takes prilosec    Past Surgical History  Procedure Laterality Date  . Cholecystectomy  2001  . Lapband  04/2012  . Wisdom tooth extraction    . Dilatation & curettage/hysteroscopy with myosure N/A 09/10/2014    Procedure: DILATATION & CURETTAGE/HYSTEROSCOPY WITH MYOSURE/INSERTION OF IUD;  Surgeon: Ok Edwards, MD;  Location: [facility];  Service: Gynecology;  Laterality: N/A;   Family History  Problem Relation Age of Onset  . Hypertension Father   . Diabetes Father   . Asthma Maternal Grandmother   . Hypertension Mother    Social History  Substance Use Topics  . Smoking status: Never Smoker   . Smokeless tobacco: Never Used  . Alcohol Use: No     Comment: rarely    OB History    Gravida Para Term Preterm AB TAB SAB Ectopic Multiple Living   Review of Systems  Neurological: Positive for numbness.  All other systems reviewed and are negative.     Allergies  Oxycodone; Penicillins; and Sulfa antibiotics  Home Medications   Prior to Admission medications   Medication Sig Start Date End Date Taking? Authorizing Provider  albuterol (PROVENTIL HFA;VENTOLIN HFA) 108 (90 BASE) MCG/ACT inhaler Inhale 2 puffs into the lungs every 6 (six) hours as needed for wheezing or shortness of breath. 01/13/15  Yes Dianne Dun, MD  buPROPion (WELLBUTRIN XL) 300 MG 24 hr tablet Take 300 mg by mouth daily.   Yes Historical Provider, MD  cetirizine (ZYRTEC) 10 MG tablet Take 10 mg by mouth at bedtime.   Yes Historical Provider, MD  fluticasone (FLONASE) 50 MCG/ACT nasal spray Place 2 sprays into both nostrils daily as needed for rhinitis.   Yes Historical Provider, MD  HYDROcodone-acetaminophen (NORCO/VICODIN) 5-325 MG tablet Take 0.5-1 tablets by mouth 2 (two) times daily as needed for moderate pain.   Yes Historical Provider, MD  ibuprofen (ADVIL,MOTRIN) 200 MG tablet Take 800 mg by mouth every 4 (four) hours as needed for headache or mild pain.    Yes  Historical Provider, MD  ketotifen (ZADITOR) 0.025 % ophthalmic solution Place 1 drop into both eyes 2 (two) times daily as needed (for allergies).   Yes Historical Provider, MD  lamoTRIgine (LAMICTAL) 200 MG tablet Take 300 mg by mouth at bedtime.    Yes Historical Provider, MD  lisdexamfetamine (VYVANSE) 30 MG capsule Take 30 mg by mouth daily.   Yes Historical Provider, MD  LORazepam (ATIVAN) 1 MG tablet Take 0.5-1 mg by mouth every 8 (eight) hours as needed for anxiety or sleep.    Yes Historical Provider, MD  metaxalone (SKELAXIN) 800 MG tablet Take 800 mg by mouth 3 (three) times daily as needed for muscle spasms.   Yes Historical Provider, MD  metFORMIN (GLUCOPHAGE) 500 MG tablet Take 500 mg  by mouth daily with breakfast.   Yes Historical Provider, MD  montelukast (SINGULAIR) 10 MG tablet Take 1 tablet (10 mg total) by mouth at bedtime. 01/13/15  Yes Dianne Dunalia M Aron, MD  omeprazole (PRILOSEC) 20 MG capsule Take 1 capsule (20 mg total) by mouth daily. 01/13/15  Yes Dianne Dunalia M Aron, MD  PARoxetine (PAXIL-CR) 25 MG 24 hr tablet Take 50 mg by mouth at bedtime.    Yes Historical Provider, MD  spironolactone (ALDACTONE) 25 MG tablet Take 25 mg by mouth daily.   Yes Historical Provider, MD   BP 112/69 mmHg  Pulse 115  Temp(Src) 98.6 F (37 C) (Oral)  Resp 17  Ht 5\' 4"  (1.626 m)  Wt 253 lb (114.76 kg)  BMI 43.41 kg/m2  SpO2 98% Physical Exam  Constitutional: She is oriented to person, place, and time. She appears well-developed and well-nourished.  HENT:  Head: Normocephalic.  Mouth/Throat: Oropharynx is clear and moist.  Eyes: Conjunctivae are normal. Pupils are equal, round, and reactive to light.  Neck: Normal range of motion. Neck supple.  Cardiovascular: Normal rate, regular rhythm and normal heart sounds.   Pulmonary/Chest: Effort normal and breath sounds normal. No respiratory distress. She has no wheezes. She has no rales.  Abdominal: Soft. Bowel sounds are normal. She exhibits no distension. There is no tenderness. There is no rebound.  Musculoskeletal: Normal range of motion. She exhibits no edema or tenderness.  Neurological: She is alert and oriented to person, place, and time.  Slightly dec sensation L lower lip. Nl sensation bilateral arms and legs. Nl strength throughout. CN otherwise unremarkable   Skin: Skin is warm and dry.  Psychiatric: She has a normal mood and affect. Her behavior is normal. Judgment and thought content normal.  Nursing note and vitals reviewed.   ED Course  Procedures (including critical care time) Labs Review Labs Reviewed  URINALYSIS COMPLETEWITH MICROSCOPIC ([facility] ONLY) - Abnormal; Notable for the following:    Color, Urine YELLOW (*)     APPearance CLEAR (*)    Squamous Epithelial / LPF 0-5 (*)    All other components within normal limits  CBC WITH DIFFERENTIAL/PLATELET - Abnormal; Notable for the following:    WBC 12.4 (*)    Neutro Abs 8.3 (*)    All other components within normal limits  ETHANOL  PROTIME-INR  APTT  COMPREHENSIVE METABOLIC PANEL  TROPONIN I  POCT PREGNANCY, URINE    Imaging Review Ct Head Wo Contrast  06/11/2015  CLINICAL DATA:  Code stroke. Tongue and lower lip numbness for 3 hours. EXAM: CT HEAD WITHOUT CONTRAST TECHNIQUE: Contiguous axial images were obtained from the base of the skull through the vertex without intravenous contrast. COMPARISON:  None. FINDINGS: Brain: No  evidence of acute infarction, hemorrhage, extra-axial collection, ventriculomegaly, or mass effect. Vascular: No hyperdense vessel or unexpected calcification. Skull: Negative for fracture or focal lesion. Sinuses/Orbits: No acute findings. Other: None. IMPRESSION: No acute intracranial abnormality. Electronically Signed   By: Rubye Oaks M.D.   On: 06/11/2015 20:20   I have personally reviewed and evaluated these images and lab results as part of my medical decision-making.   EKG Interpretation None       ED ECG REPORT I, Braxon Suder, the attending physician, personally viewed and interpreted this ECG.   Date: 06/11/2015  EKG Time: 20:04  Rate: 107  Rhythm: sinus tachycardia  Axis: normal  Intervals:none  ST&T Change: none   MDM   Final diagnoses:  None    PRIYANA MCCAREY is a 50 y.o. female here with L lower lip numbness, arm and leg numbness that is improving. No motor deficits. Code stroke activated in triage. CT head showed no bleed. Will have [facility] neurology see patient. Not TPA candidate due to lack of deficits.   9pm SOC recommend admission for TIA workup. Labs pending, CT head showed no bleed.   10:47 PM Labs unremarkable. Will admit for TIA workup      Richardean Canal, MD 06/11/15 2247

## 2015-06-11 NOTE — ED Notes (Signed)
To room 8, placed in hosp gown and on card monitor

## 2015-06-11 NOTE — Progress Notes (Signed)
°   06/11/15 2000  Clinical Encounter Type  Visited With Patient and family together  Visit Type Code  Referral From Nurse  Consult/Referral To Chaplain  Spiritual Encounters  Spiritual Needs Prayer  Stress Factors  Patient Stress Factors Health changes;Major life changes  Family Stress Factors Health changes  Prayed with pt. And family.

## 2015-06-11 NOTE — ED Notes (Signed)
Placed hat in toilet for urine collection, urged patient to give us sample when she's able.

## 2015-06-11 NOTE — ED Notes (Signed)
Patient ambulatory to triage with steady gait, without difficulty or distress noted; pt reports numbness to tongue and lower lip x 3 hours and "weird feeling in left arm"; pt A&Ox3, MAEW,. PERRL, speech clear and face symmetrical

## 2015-06-11 NOTE — ED Notes (Addendum)
Pt taken to CT via w/c; ED secretary Olegario MessierKathy notified to call Code Stroke

## 2015-06-11 NOTE — ED Notes (Signed)
SOC in room. 

## 2015-06-11 NOTE — ED Notes (Signed)
SOC concluded

## 2015-06-11 NOTE — ED Notes (Signed)
Patient laughing and joking, no obvious deficits noted during smile and interaction with patient, will proceed with NIH

## 2015-06-12 ENCOUNTER — Observation Stay
Admit: 2015-06-12 | Discharge: 2015-06-12 | Disposition: A | Discharge status: Home / Self Care | Payer: Managed Care, Other (non HMO) | Attending: Internal Medicine | Admitting: Internal Medicine

## 2015-06-12 ENCOUNTER — Observation Stay: Payer: Managed Care, Other (non HMO)

## 2015-06-12 DIAGNOSIS — Z8673 Personal history of transient ischemic attack (TIA), and cerebral infarction without residual deficits: Secondary | ICD-10-CM | POA: Diagnosis present

## 2015-06-12 DIAGNOSIS — G459 Transient cerebral ischemic attack, unspecified: Secondary | ICD-10-CM

## 2015-06-12 LAB — CREATININE, SERUM
Creatinine, Ser: 0.8 mg/dL (ref 0.44–1.00)
GFR calc non Af Amer: >60 mL/min (ref >60)

## 2015-06-12 LAB — ECHOCARDIOGRAM COMPLETE
Height: 64 in
WEIGHTICAEL: 3998.4 [oz_av]

## 2015-06-12 LAB — LIPID PANEL
CHOL/HDL RATIO: 3.7 ratio
CHOLESTEROL: 160 mg/dL (ref 0–200)
HDL: 43 mg/dL (ref >40)
LDL Cholesterol: 87 mg/dL (ref 0–99)
TRIGLYCERIDES: 148 mg/dL (ref <150)
VLDL: 30 mg/dL (ref 0–40)

## 2015-06-12 LAB — GLUCOSE, CAPILLARY
Glucose-Capillary: 89 mg/dL (ref 65–99)
Glucose-Capillary: 115 mg/dL — ABNORMAL HIGH (ref 65–99)
Glucose-Capillary: 107 mg/dL — ABNORMAL HIGH (ref 65–99)

## 2015-06-12 LAB — CBC
HEMATOCRIT: 36 % (ref 35.0–47.0)
HEMOGLOBIN: 12 g/dL (ref 12.0–16.0)
MCH: 27.4 pg (ref 26.0–34.0)
MCHC: 33.4 g/dL (ref 32.0–36.0)
MCV: 82 fL (ref 80.0–100.0)
PLATELETS: 245 10*3/uL (ref 150–440)
RBC: 4.39 MIL/uL (ref 3.80–5.20)
RDW: 14.4 % (ref 11.5–14.5)
WBC: 11.6 10*3/uL — AB (ref 3.6–11.0)

## 2015-06-12 LAB — HEMOGLOBIN A1C: HEMOGLOBIN A1C: 5.9 % (ref 4.0–6.0)

## 2015-06-12 MED ORDER — ASPIRIN EC 81 MG PO TBEC: 81.0000 mg | DELAYED_RELEASE_TABLET | Freq: Every day | ORAL | Status: DC

## 2015-06-12 MED ORDER — INSULIN ASPART 100 UNIT/ML ~~LOC~~ SOLN: 0.0000 [IU] | Freq: Every day | SUBCUTANEOUS | Status: DC

## 2015-06-12 MED ORDER — BUPROPION HCL ER (XL) 300 MG PO TB24
300.0000 mg | ORAL_TABLET | Freq: Every day | ORAL | Status: DC
  Administered 2015-06-12: 300 mg via ORAL
  Filled 2015-06-12: qty 1

## 2015-06-12 MED ORDER — GADOBENATE DIMEGLUMINE 529 MG/ML IV SOLN: 20.0000 mL | Freq: Once | INTRAVENOUS | Status: DC | PRN

## 2015-06-12 MED ORDER — ACETAMINOPHEN 325 MG PO TABS: 650.0000 mg | ORAL_TABLET | Freq: Four times a day (QID) | ORAL | Status: DC | PRN

## 2015-06-12 MED ORDER — INSULIN ASPART 100 UNIT/ML ~~LOC~~ SOLN: 0.0000 [IU] | Freq: Three times a day (TID) | SUBCUTANEOUS | Status: DC

## 2015-06-12 MED ORDER — PAROXETINE HCL ER 12.5 MG PO TB24
50.0000 mg | ORAL_TABLET | Freq: Every day | ORAL | Status: DC
  Filled 2015-06-12: qty 4

## 2015-06-12 MED ORDER — PANTOPRAZOLE SODIUM 40 MG PO TBEC
40.0000 mg | DELAYED_RELEASE_TABLET | Freq: Every day | ORAL | Status: DC
  Administered 2015-06-12: 11:00:00 40 mg via ORAL
  Filled 2015-06-12: qty 1

## 2015-06-12 MED ORDER — STROKE: EARLY STAGES OF RECOVERY BOOK
Freq: Once | Status: AC
  Administered 2015-06-12: 03:00:00

## 2015-06-12 MED ORDER — LORAZEPAM 0.5 MG PO TABS
0.5000 mg | ORAL_TABLET | Freq: Three times a day (TID) | ORAL | Status: DC | PRN
  Administered 2015-06-12: 11:00:00 1 mg via ORAL
  Filled 2015-06-12: qty 2

## 2015-06-12 MED ORDER — HYDROCODONE-ACETAMINOPHEN 5-325 MG PO TABS: 0.5000 | ORAL_TABLET | Freq: Two times a day (BID) | ORAL | Status: DC | PRN

## 2015-06-12 MED ORDER — LAMOTRIGINE 100 MG PO TABS: 300.0000 mg | ORAL_TABLET | Freq: Every day | ORAL | Status: DC

## 2015-06-12 MED ORDER — PERFLUTREN LIPID MICROSPHERE
INTRAVENOUS | Status: AC
  Filled 2015-06-12: qty 10

## 2015-06-12 MED ORDER — ENOXAPARIN SODIUM 40 MG/0.4ML ~~LOC~~ SOLN
40.0000 mg | Freq: Two times a day (BID) | SUBCUTANEOUS | Status: DC
  Administered 2015-06-12: 11:00:00 40 mg via SUBCUTANEOUS
  Filled 2015-06-12: qty 0.4

## 2015-06-12 MED ORDER — MONTELUKAST SODIUM 10 MG PO TABS: 10.0000 mg | ORAL_TABLET | Freq: Every day | ORAL | Status: DC

## 2015-06-12 MED ORDER — LORAZEPAM 2 MG/ML IJ SOLN
0.5000 mg | Freq: Once | INTRAMUSCULAR | Status: AC
  Administered 2015-06-12: 15:00:00 0.5 mg via INTRAVENOUS
  Filled 2015-06-12: qty 1

## 2015-06-12 MED ORDER — ALBUTEROL SULFATE (2.5 MG/3ML) 0.083% IN NEBU: 3.0000 mL | INHALATION_SOLUTION | Freq: Four times a day (QID) | RESPIRATORY_TRACT | Status: DC | PRN

## 2015-06-12 NOTE — H&P (Signed)
[facility] - [place] at [facility]   PATIENT NAME: Debra Myers    MR#:  [id number]  DATE OF BIRTH:  [birth year]  DATE OF ADMISSION:  06/11/2015  PRIMARY CARE PHYSICIAN: Ruthe Mannan, MD   REQUESTING/REFERRING PHYSICIAN: Silverio Lay, MD  CHIEF COMPLAINT:   Chief Complaint  Patient presents with  . Code Stroke    HISTORY OF PRESENT ILLNESS:  Debra Myers  is a 50 y.o. female who presents After an episode earlier today of "feeling funny", followed by some transient left upper and lower extremity weakness, and then some perioral and lingual numbness. She was seen here in the ED by the telemetry neurologist who recommended admission for workup of TIA. The majority of her symptoms had resolved by that point, with only some intermittent purulent lingual numbness still occurring. Initial workup in the ED was largely negative. Hospitalists were called for admission.  PAST MEDICAL HISTORY:   Past Medical History  Diagnosis Date  . Depression   . Vaginal delivery     ONE NSVD  . Elective abortion     ONE  . Asthma     ALLERGY INDUCED  . Diabetes mellitus     TYPE 2  . Anxiety   . Hypertension   . Degenerative disc disease   . Degenerative lumbar disc   . GERD (gastroesophageal reflux disease)     takes prilosec     PAST SURGICAL HISTORY:   Past Surgical History  Procedure Laterality Date  . Cholecystectomy  2001  . Lapband  04/2012  . Wisdom tooth extraction    . Dilatation & curettage/hysteroscopy with myosure N/A 09/10/2014    Procedure: DILATATION & CURETTAGE/HYSTEROSCOPY WITH MYOSURE/INSERTION OF IUD;  Surgeon: Ok Edwards, MD;  Location: [facility];  Service: Gynecology;  Laterality: N/A;    SOCIAL HISTORY:   Social History  Substance Use Topics  . Smoking status: Never Smoker   . Smokeless tobacco: Never Used  . Alcohol Use: No     Comment: rarely    FAMILY HISTORY:   Family History  Problem Relation Age of Onset  . Hypertension Father   .  Diabetes Father   . Asthma Maternal Grandmother   . Hypertension Mother     DRUG ALLERGIES:   Allergies  Allergen Reactions  . Oxycodone Itching  . Penicillins Other (See Comments)    Reaction:  GI upset  Has patient had a PCN reaction causing immediate rash, facial/tongue/throat swelling, SOB or lightheadedness with hypotension: No Has patient had a PCN reaction causing severe rash involving mucus membranes or skin necrosis: No Has patient had a PCN reaction that required hospitalization No Has patient had a PCN reaction occurring within the last 10 years: No If all of the above answers are "NO", then may proceed with Cephalosporin use.  . Sulfa Antibiotics Itching and Rash    MEDICATIONS AT HOME:   Prior to Admission medications   Medication Sig Start Date End Date Taking? Authorizing Provider  albuterol (PROVENTIL HFA;VENTOLIN HFA) 108 (90 BASE) MCG/ACT inhaler Inhale 2 puffs into the lungs every 6 (six) hours as needed for wheezing or shortness of breath. 01/13/15  Yes Dianne Dun, MD  buPROPion (WELLBUTRIN XL) 300 MG 24 hr tablet Take 300 mg by mouth daily.   Yes Historical Provider, MD  cetirizine (ZYRTEC) 10 MG tablet Take 10 mg by mouth at bedtime.   Yes Historical Provider, MD  fluticasone (FLONASE) 50 MCG/ACT nasal spray Place 2 sprays into both nostrils  daily as needed for rhinitis.   Yes Historical Provider, MD  HYDROcodone-acetaminophen (NORCO/VICODIN) 5-325 MG tablet Take 0.5-1 tablets by mouth 2 (two) times daily as needed for moderate pain.   Yes Historical Provider, MD  ibuprofen (ADVIL,MOTRIN) 200 MG tablet Take 800 mg by mouth every 4 (four) hours as needed for headache or mild pain.    Yes Historical Provider, MD  ketotifen (ZADITOR) 0.025 % ophthalmic solution Place 1 drop into both eyes 2 (two) times daily as needed (for allergies).   Yes Historical Provider, MD  lamoTRIgine (LAMICTAL) 200 MG tablet Take 300 mg by mouth at bedtime.    Yes Historical Provider, MD   lisdexamfetamine (VYVANSE) 30 MG capsule Take 30 mg by mouth daily.   Yes Historical Provider, MD  LORazepam (ATIVAN) 1 MG tablet Take 0.5-1 mg by mouth every 8 (eight) hours as needed for anxiety or sleep.    Yes Historical Provider, MD  metaxalone (SKELAXIN) 800 MG tablet Take 800 mg by mouth 3 (three) times daily as needed for muscle spasms.   Yes Historical Provider, MD  metFORMIN (GLUCOPHAGE) 500 MG tablet Take 500 mg by mouth daily with breakfast.   Yes Historical Provider, MD  montelukast (SINGULAIR) 10 MG tablet Take 1 tablet (10 mg total) by mouth at bedtime. 01/13/15  Yes Dianne Dun, MD  omeprazole (PRILOSEC) 20 MG capsule Take 1 capsule (20 mg total) by mouth daily. 01/13/15  Yes Dianne Dun, MD  PARoxetine (PAXIL-CR) 25 MG 24 hr tablet Take 50 mg by mouth at bedtime.    Yes Historical Provider, MD  spironolactone (ALDACTONE) 25 MG tablet Take 25 mg by mouth daily.   Yes Historical Provider, MD    REVIEW OF SYSTEMS:  Review of Systems  Constitutional: Negative for fever, chills, weight loss and malaise/fatigue.  HENT: Negative for ear pain, hearing loss and tinnitus.   Eyes: Negative for blurred vision, double vision, pain and redness.  Respiratory: Negative for cough, hemoptysis and shortness of breath.   Cardiovascular: Negative for chest pain, palpitations, orthopnea and leg swelling.  Gastrointestinal: Negative for nausea, vomiting, abdominal pain, diarrhea and constipation.  Genitourinary: Negative for dysuria, frequency and hematuria.  Musculoskeletal: Negative for back pain, joint pain and neck pain.  Skin:       No acne, rash, or lesions  Neurological: Positive for tingling, sensory change and focal weakness. Negative for dizziness, tremors and weakness.  Endo/Heme/Allergies: Negative for polydipsia. Does not bruise/bleed easily.  Psychiatric/Behavioral: Negative for depression. The patient is not nervous/anxious and does not have insomnia.      VITAL SIGNS:    Filed Vitals:   06/11/15 1958 06/11/15 2030 06/11/15 2108 06/11/15 2130  BP: 147/94 130/79  112/69  Pulse: 115     Temp: 98.6 F (37 C)  98.6 F (37 C)   TempSrc: Oral     Resp: 20 25  17   Height: 5\' 4"  (1.626 m)     Weight: 114.76 kg (253 lb)     SpO2: 98%      Wt Readings from Last 3 Encounters:  06/11/15 114.76 kg (253 lb)  05/26/15 113.399 kg (250 lb)  02/26/15 121.564 kg (268 lb)    PHYSICAL EXAMINATION:  Physical Exam  Vitals reviewed. Constitutional: She is oriented to person, place, and time. She appears well-developed and well-nourished. No distress.  HENT:  Head: Normocephalic and atraumatic.  Mouth/Throat: Oropharynx is clear and moist.  Eyes: Conjunctivae and EOM are normal. Pupils are equal, round, and reactive  to light. No scleral icterus.  Neck: Normal range of motion. Neck supple. No JVD present. No thyromegaly present.  Cardiovascular: Normal rate, regular rhythm and intact distal pulses.  Exam reveals no gallop and no friction rub.   No murmur heard. Respiratory: Effort normal and breath sounds normal. No respiratory distress. She has no wheezes. She has no rales.  GI: Soft. Bowel sounds are normal. She exhibits no distension. There is no tenderness.  Musculoskeletal: Normal range of motion. She exhibits no edema.  No arthritis, no gout  Lymphadenopathy:    She has no cervical adenopathy.  Neurological: She is alert and oriented to person, place, and time. No cranial nerve deficit.  Neurologic: Cranial nerves II-XII intact, Sensation intact to light touch/pinprick except around the left side of her lips, 5/5 strength in all extremities with some very subtle left lower extremity weakness which the patient states is not new, and is not related to her symptoms today, but rather related to chronic lower back problems, no dysarthria, no aphasia, no dysphagia, memory intact, finger to nose testing showed no abnormality, no pronator drift, DTR intact, Babinski sign  not present.   Skin: Skin is warm and dry. No rash noted. No erythema.  Psychiatric: She has a normal mood and affect. Her behavior is normal. Judgment and thought content normal.    LABORATORY PANEL:   CBC  Recent Labs Lab 06/11/15 2010  WBC 12.4*  HGB 13.1  HCT 39.6  PLT 287   ------------------------------------------------------------------------------------------------------------------  Chemistries   Recent Labs Lab 06/11/15 2011  NA 138  K 4.0  CL 103  CO2 26  GLUCOSE 85  BUN 12  CREATININE 0.83  CALCIUM 9.0  AST 17  ALT 25  ALKPHOS 116  BILITOT 0.5   ------------------------------------------------------------------------------------------------------------------  Cardiac Enzymes  Recent Labs Lab 06/11/15 2010  TROPONINI <0.03   ------------------------------------------------------------------------------------------------------------------  RADIOLOGY:  Ct Head Wo Contrast  06/11/2015  CLINICAL DATA:  Code stroke. Tongue and lower lip numbness for 3 hours. EXAM: CT HEAD WITHOUT CONTRAST TECHNIQUE: Contiguous axial images were obtained from the base of the skull through the vertex without intravenous contrast. COMPARISON:  None. FINDINGS: Brain: No evidence of acute infarction, hemorrhage, extra-axial collection, ventriculomegaly, or mass effect. Vascular: No hyperdense vessel or unexpected calcification. Skull: Negative for fracture or focal lesion. Sinuses/Orbits: No acute findings. Other: None. IMPRESSION: No acute intracranial abnormality. Electronically Signed   By: Rubye Oaks M.D.   On: 06/11/2015 20:20    EKG:   Orders placed or performed during the hospital encounter of 06/11/15  . ED EKG  . ED EKG  . EKG 12-Lead  . EKG 12-Lead    IMPRESSION AND PLAN:  Principal Problem:   TIA (transient ischemic attack) - admission and workup per TIA/stroke admission orders set including standard imaging, labs and neurology consult Active  Problems:   Diabetes (HCC) - type 2, patient is not on insulin at home. We'll have her modified diet here with sliding scale insulin and corresponding glucose checks   Generalized anxiety disorder - home dose anxiolytics   Hypertension - would allow for permissive hypertension less than 220/120, however patient's blood pressure is controlled on its own.   Asthma - home inhalers   GERD (gastroesophageal reflux disease) - home dose PPI  All the records are reviewed and case discussed with ED provider. Management plans discussed with the patient and/or family.  DVT PROPHYLAXIS: SubQ lovenox  GI PROPHYLAXIS: PPI  ADMISSION STATUS: Observation  CODE STATUS: Full Code  Status History    This patient does not have a recorded code status. Please follow your organizational policy for patients in this situation.      TOTAL TIME TAKING CARE OF THIS PATIENT: 45 minutes.    Abhijot Straughter FIELDING 06/12/2015, 1:42 AM  [facility]  Office  [phone]  CC: Primary care physician; Ruthe Mannanalia Aron, MD

## 2015-06-12 NOTE — Discharge Summary (Signed)
[facility] - [place] at [facility]   PATIENT NAME: Debra Myers    MR#:  [id number]  DATE OF BIRTH:  1965-10-14  DATE OF ADMISSION:  06/11/2015 ADMITTING PHYSICIAN: Oralia Manis, MD  DATE OF DISCHARGE: 06/12/15  PRIMARY CARE PHYSICIAN: Ruthe Mannan, MD    ADMISSION DIAGNOSIS:  Numbness [R20.0]  DISCHARGE DIAGNOSIS:  TIA  SECONDARY DIAGNOSIS:   Past Medical History  Diagnosis Date  . Depression   . Vaginal delivery     ONE NSVD  . Elective abortion     ONE  . Asthma     ALLERGY INDUCED  . Diabetes mellitus     TYPE 2  . Anxiety   . Hypertension   . Degenerative disc disease   . Degenerative lumbar disc   . GERD (gastroesophageal reflux disease)     takes prilosec     HOSPITAL COURSE:  1.TIA (transient ischemic attack) - admission and workup per TIA/stroke admission orders set including standard imaging, labs and neurology consult -symptoms improved -MRI/MRA neg Cont asa  2. Diabetes (HCC) - type 2, patient is not on insulin at home.  -resumed home meds Sugars ok  3.Generalized anxiety disorder - home dose anxiolytics  4. Hypertension - would allow for permissive hypertension less than 220/120, however patient's blood pressure is controlled on its own.  5. Asthma - home inhalers   6.GERD (gastroesophageal reflux disease) - home dose PPI  Overall better D/c home  CONSULTS OBTAINED:  Treatment Team:  Thana Farr, MD  DRUG ALLERGIES:   Allergies  Allergen Reactions  . Oxycodone Itching  . Penicillins Other (See Comments)    Reaction:  GI upset  Has patient had a PCN reaction causing immediate rash, facial/tongue/throat swelling, SOB or lightheadedness with hypotension: No Has patient had a PCN reaction causing severe rash involving mucus membranes or skin necrosis: No Has patient had a PCN reaction that required hospitalization No Has patient had a PCN reaction occurring within the last 10 years: No If all of the  above answers are "NO", then may proceed with Cephalosporin use.  . Sulfa Antibiotics Itching and Rash    DISCHARGE MEDICATIONS:   Current Discharge Medication List    START taking these medications   Details  aspirin EC 81 MG tablet Take 1 tablet (81 mg total) by mouth daily. Qty: 30 tablet, Refills: 2      CONTINUE these medications which have NOT CHANGED   Details  albuterol (PROVENTIL HFA;VENTOLIN HFA) 108 (90 BASE) MCG/ACT inhaler Inhale 2 puffs into the lungs every 6 (six) hours as needed for wheezing or shortness of breath. Qty: 1 Inhaler, Refills: 5    buPROPion (WELLBUTRIN XL) 300 MG 24 hr tablet Take 300 mg by mouth daily.    cetirizine (ZYRTEC) 10 MG tablet Take 10 mg by mouth at bedtime.    fluticasone (FLONASE) 50 MCG/ACT nasal spray Place 2 sprays into both nostrils daily as needed for rhinitis.    HYDROcodone-acetaminophen (NORCO/VICODIN) 5-325 MG tablet Take 0.5-1 tablets by mouth 2 (two) times daily as needed for moderate pain.    ibuprofen (ADVIL,MOTRIN) 200 MG tablet Take 800 mg by mouth every 4 (four) hours as needed for headache or mild pain.    Associated Diagnoses: Chronic pain syndrome    ketotifen (ZADITOR) 0.025 % ophthalmic solution Place 1 drop into both eyes 2 (two) times daily as needed (for allergies).    lamoTRIgine (LAMICTAL) 200 MG tablet Take 300 mg by mouth at bedtime.  lisdexamfetamine (VYVANSE) 30 MG capsule Take 30 mg by mouth daily.    LORazepam (ATIVAN) 1 MG tablet Take 0.5-1 mg by mouth every 8 (eight) hours as needed for anxiety or sleep.     metaxalone (SKELAXIN) 800 MG tablet Take 800 mg by mouth 3 (three) times daily as needed for muscle spasms.    metFORMIN (GLUCOPHAGE) 500 MG tablet Take 500 mg by mouth daily with breakfast.    montelukast (SINGULAIR) 10 MG tablet Take 1 tablet (10 mg total) by mouth at bedtime. Qty: 30 tablet, Refills: 7    omeprazole (PRILOSEC) 20 MG capsule Take 1 capsule (20 mg total) by mouth  daily. Qty: 30 capsule, Refills: 11    PARoxetine (PAXIL-CR) 25 MG 24 hr tablet Take 50 mg by mouth at bedtime.     spironolactone (ALDACTONE) 25 MG tablet Take 25 mg by mouth daily.        If you experience worsening of your admission symptoms, develop shortness of breath, life threatening emergency, suicidal or homicidal thoughts you must seek medical attention immediately by calling 911 or calling your MD immediately  if symptoms less severe.  You Must read complete instructions/literature along with all the possible adverse reactions/side effects for all the Medicines you take and that have been prescribed to you. Take any new Medicines after you have completely understood and accept all the possible adverse reactions/side effects.   Please note  You were cared for by a hospitalist during your hospital stay. If you have any questions about your discharge medications or the care you received while you were in the hospital after you are discharged, you can call the unit and asked to speak with the hospitalist on call if the hospitalist that took care of you is not available. Once you are discharged, your primary care physician will handle any further medical issues. Please note that NO REFILLS for any discharge medications will be authorized once you are discharged, as it is imperative that you return to your primary care physician (or establish a relationship with a primary care physician if you do not have one) for your aftercare needs so that they can reassess your need for medications and monitor your lab values. Today   SUBJECTIVE   Left UE and LE numbness better.   VITAL SIGNS:  Blood pressure 130/69, pulse 94, temperature 98 F (36.7 C), temperature source Oral, resp. rate 18, height 5\' 4"  (1.626 m), weight 113.354 kg (249 lb 14.4 oz), SpO2 99 %.  I/O:   Intake/Output Summary (Last 24 hours) at 06/12/15 1742 Last data filed at 06/12/15 0349  Gross per 24 hour  Intake      0  ml  Output     50 ml  Net    -50 ml    PHYSICAL EXAMINATION:  GENERAL:  50 y.o.-year-old patient lying in the bed with no acute distress. obese EYES: Pupils equal, round, reactive to light and accommodation. No scleral icterus. Extraocular muscles intact.  HEENT: Head atraumatic, normocephalic. Oropharynx and nasopharynx clear.  NECK:  Supple, no jugular venous distention. No thyroid enlargement, no tenderness.  LUNGS: Normal breath sounds bilaterally, no wheezing, rales,rhonchi or crepitation. No use of accessory muscles of respiration.  CARDIOVASCULAR: S1, S2 normal. No murmurs, rubs, or gallops.  ABDOMEN: Soft, non-tender, non-distended. Bowel sounds present. No organomegaly or mass.  EXTREMITIES: No pedal edema, cyanosis, or clubbing.  NEUROLOGIC: Cranial nerves II through XII are intact. Muscle strength 5/5 in all extremities. Sensation intact. Gait not  checked.  PSYCHIATRIC: The patient is alert and oriented x 3.  SKIN: No obvious rash, lesion, or ulcer.   DATA REVIEW:   CBC   Recent Labs Lab 06/12/15 0421  WBC 11.6*  HGB 12.0  HCT 36.0  PLT 245    Chemistries   Recent Labs Lab 06/11/15 2011 06/12/15 0421  NA 138  --   K 4.0  --   CL 103  --   CO2 26  --   GLUCOSE 85  --   BUN 12  --   CREATININE 0.83 0.80  CALCIUM 9.0  --   AST 17  --   ALT 25  --   ALKPHOS 116  --   BILITOT 0.5  --     Microbiology Results   No results found for this or any previous visit (from the past 240 hour(s)).  RADIOLOGY:  Dg Chest 2 View  06/12/2015  CLINICAL DATA:  TIA EXAM: CHEST  2 VIEW COMPARISON:  04/24/2012 chest radiograph. FINDINGS: Stable cardiomediastinal silhouette with normal heart size. No pneumothorax. No pleural effusion. Lungs appear clear, with no acute consolidative airspace disease and no pulmonary edema. There is a laparoscopic gastric band in the expected location just below the esophagogastric junction with partially visualized tubing extending inferiorly  into the ventral abdominal wall. IMPRESSION: No active cardiopulmonary disease. Electronically Signed   By: Delbert Phenix M.D.   On: 06/12/2015 09:09   Ct Head Wo Contrast  06/12/2015  CLINICAL DATA:  Pt states she began experiencing tingling in lip/tongue yesterday, left sided weakness yesterday has since subsided. Pt states she experienced some right sided chest pain through to back yesterday but it may be reflux related. Pt states hx of anxiety, reflux, severe back pain. Pt denies vision or balance changes. No hx trauma. No hx stroke. EXAM: CT HEAD WITHOUT CONTRAST TECHNIQUE: Contiguous axial images were obtained from the base of the skull through the vertex without intravenous contrast. COMPARISON:  06/11/2015 FINDINGS: The ventricles are normal in size and configuration. There are no parenchymal masses or mass effect, no evidence of an infarct, no extra-axial masses or abnormal fluid collections and no evidence of intracranial hemorrhage. Visualized sinuses and mastoid air cells are clear. No skull lesion. IMPRESSION: Normal unenhanced CT scan of the brain. Electronically Signed   By: Amie Portland M.D.   On: 06/12/2015 10:12   Ct Head Wo Contrast  06/11/2015  CLINICAL DATA:  Code stroke. Tongue and lower lip numbness for 3 hours. EXAM: CT HEAD WITHOUT CONTRAST TECHNIQUE: Contiguous axial images were obtained from the base of the skull through the vertex without intravenous contrast. COMPARISON:  None. FINDINGS: Brain: No evidence of acute infarction, hemorrhage, extra-axial collection, ventriculomegaly, or mass effect. Vascular: No hyperdense vessel or unexpected calcification. Skull: Negative for fracture or focal lesion. Sinuses/Orbits: No acute findings. Other: None. IMPRESSION: No acute intracranial abnormality. Electronically Signed   By: Rubye Oaks M.D.   On: 06/11/2015 20:20   Mr Brain Wo Contrast  06/12/2015  CLINICAL DATA:  Patient presents with an episode of feeling funny, earlier  today, with transient LEFT upper and lower extremity weakness. No current reported neurologic deficit. EXAM: MRI HEAD WITHOUT CONTRAST MRA HEAD WITHOUT CONTRAST TECHNIQUE: Multiplanar, multiecho pulse sequences of the brain and surrounding structures were obtained without intravenous contrast. Angiographic images of the head were obtained using MRA technique without contrast. COMPARISON:  CT head 06/11/2015 and 06/12/2015. FINDINGS: MRI HEAD FINDINGS The patient was unable to remain motionless for  the exam. Small or subtle lesions could be overlooked. No evidence for acute infarction, hemorrhage, mass lesion, hydrocephalus, or extra-axial fluid. Normal cerebral volume. No definite white matter disease. Pituitary, pineal, and cerebellar tonsils unremarkable. No upper cervical lesions. Flow voids are maintained throughout the carotid, basilar, and vertebral arteries. There are no areas of chronic hemorrhage. Visualized calvarium, skull base, and upper cervical osseous structures unremarkable. Scalp and extracranial soft tissues, orbits, sinuses, and mastoids show no acute process. Compared with recent CT, similar appearance. MRA HEAD FINDINGS The internal carotid arteries are widely patent. The basilar artery is widely patent. No M1 stenosis or dissection. Dominant RIGHT A1 ACA with mild to moderate disease of the LEFT A1 and M2 ACA segments. Basilar artery widely patent with RIGHT vertebral dominant. No PCA stenosis. No cerebellar branch occlusion. No visible intracranial aneurysm. IMPRESSION: Motion degraded exam demonstrating no acute or focal intracranial abnormality. No intracranial stenosis or occlusion of significance is observed. LEFT A1 and A2 ACA disease, doubtful significance. Electronically Signed   By: Elsie Stain M.D.   On: 06/12/2015 16:24   Mr Maxine Glenn Head/brain Wo Cm  06/12/2015  CLINICAL DATA:  Patient presents with an episode of feeling funny, earlier today, with transient LEFT upper and lower  extremity weakness. No current reported neurologic deficit. EXAM: MRI HEAD WITHOUT CONTRAST MRA HEAD WITHOUT CONTRAST TECHNIQUE: Multiplanar, multiecho pulse sequences of the brain and surrounding structures were obtained without intravenous contrast. Angiographic images of the head were obtained using MRA technique without contrast. COMPARISON:  CT head 06/11/2015 and 06/12/2015. FINDINGS: MRI HEAD FINDINGS The patient was unable to remain motionless for the exam. Small or subtle lesions could be overlooked. No evidence for acute infarction, hemorrhage, mass lesion, hydrocephalus, or extra-axial fluid. Normal cerebral volume. No definite white matter disease. Pituitary, pineal, and cerebellar tonsils unremarkable. No upper cervical lesions. Flow voids are maintained throughout the carotid, basilar, and vertebral arteries. There are no areas of chronic hemorrhage. Visualized calvarium, skull base, and upper cervical osseous structures unremarkable. Scalp and extracranial soft tissues, orbits, sinuses, and mastoids show no acute process. Compared with recent CT, similar appearance. MRA HEAD FINDINGS The internal carotid arteries are widely patent. The basilar artery is widely patent. No M1 stenosis or dissection. Dominant RIGHT A1 ACA with mild to moderate disease of the LEFT A1 and M2 ACA segments. Basilar artery widely patent with RIGHT vertebral dominant. No PCA stenosis. No cerebellar branch occlusion. No visible intracranial aneurysm. IMPRESSION: Motion degraded exam demonstrating no acute or focal intracranial abnormality. No intracranial stenosis or occlusion of significance is observed. LEFT A1 and A2 ACA disease, doubtful significance. Electronically Signed   By: Elsie Stain M.D.   On: 06/12/2015 16:24     Management plans discussed with the patient, family and they are in agreement.  CODE STATUS:     Code Status Orders        Start     Ordered   06/12/15 0308  Full code   Continuous      06/12/15 0307    Code Status History    Date Active Date Inactive Code Status Order ID Comments User Context   This patient has a current code status but no historical code status.      TOTAL TIME TAKING CARE OF THIS PATIENT:31minutes.    Antwaine Boomhower M.D on 06/12/2015 at 5:42 PM  Between 7am to 6pm - Pager - [phone] After 6pm go to [email or web address] - password [facility]  Office  [phone]  CC: Primary care physician; Ruthe Mannan, MD

## 2015-06-12 NOTE — Evaluation (Signed)
Occupational Therapy Evaluation Patient Details Name: Debra ClockRobin D Myers MRN: [id number] DOB: [birth year] Today's Date: 06/12/2015    History of Present Illness Pt is a 50 y.o. female presenting to hospital with perioral and lingual numbness and transient L UE and LE weakness.  PMH includes DM, htn, depression.  She reports past issues with back and neck with occasional numbness and tingling in extremities.     Clinical Impression   Patient seen for OT evaluation this date.  Patient lives with husband and family in a one story home with 2 steps to enter, she has been independent with all self care tasks, IADL tasks including driving and working full time.  She reports she has had neck and back issues in the past and occasionally has some numbness and tingling in her extremities.  She admits to feeling stressed at work and reports she did not stay hydrated or eat much on the day of her symptoms.  Patient demonstrates 5/5 strength in her bilateral UEs and no apparent deficits noted with coordination with finger to nose or rapid alternating movements. She is able to demonstrate full ROM of BUE and 5/5 strength overall BUE.  She was able to demonstrate independent dressing and toileting this date.  Recommended patient follow up with employer with return to work regarding workspace to determine if it is ergonomically correct to decrease further neck and back issues. OT eval only, no further needs at this time, will complete OT order.  If needs arise, please reconsult.      Follow Up Recommendations  No OT follow up    Equipment Recommendations       Recommendations for Other Services       Precautions / Restrictions Precautions Precautions: None Restrictions Weight Bearing Restrictions: No      Mobility Bed Mobility Overal bed mobility: Independent             General bed mobility comments: Supine to/from sit  Transfers Overall transfer level: Independent Equipment used: None              General transfer comment: steady without loss of balance    Balance Overall balance assessment: Independent                              per PT eval: Standardized Balance Assessment Standardized Balance Assessment :  (Pt scored 28/28 on Tinetti balance assessment)          ADL Overall ADL's : At baseline;Independent                                       General ADL Comments: Patient able to demo lower body dressing, toileting and grooming independently and appears at baseline.      Vision     Perception     Praxis      Pertinent Vitals/Pain Pain Assessment: 0-10 Pain Score: 4  Pain Location: low back pain, neck stiffness Pain Descriptors / Indicators: Aching Pain Intervention(s): Limited activity within patient's tolerance;Monitored during session;Repositioned     Hand Dominance Right   Extremity/Trunk Assessment Upper Extremity Assessment Upper Extremity Assessment: Overall WFL for tasks assessed   Lower Extremity Assessment Lower Extremity Assessment: Defer to PT evaluation RLE Deficits / Details: hip flexion, knee flexion/extension, and DF all 4+/5; intact sensation, proprioception, and coordination LLE Deficits / Details: L hip flexion 4-/5 (  d/t back pain); knee flexion/extension 4+/5; DF 4+/5; intact proprioception and coordination LLE Sensation: decreased light touch (decreased sensation L lateral LE compared to R LE)   Cervical / Trunk Assessment Cervical / Trunk Assessment: Normal   Communication Communication Communication: No difficulties   Cognition Arousal/Alertness: Awake/alert Behavior During Therapy: WFL for tasks assessed/performed Overall Cognitive Status: Within Functional Limits for tasks assessed                     General Comments       Exercises       Shoulder Instructions      Home Living Family/patient expects to be discharged to:: Private residence Living Arrangements:  Spouse/significant other;Children Available Help at Discharge: Family Type of Home: House Home Access: Stairs to enter Secretary/administrator of Steps: 2 Entrance Stairs-Rails: None Home Layout: One level     Bathroom Shower/Tub: Tub/shower unit;Curtain Shower/tub characteristics: Engineer, building services: Standard Bathroom Accessibility: Yes   Home Equipment: None          Prior Functioning/Environment Level of Independence: Independent        Comments: Nursing occupation.    OT Diagnosis:     OT Problem List:     OT Treatment/Interventions:      OT Goals(Current goals can be found in the care plan section) Acute Rehab OT Goals Patient Stated Goal: patient wants to go home OT Goal Formulation: With patient Time For Goal Achievement: 06/26/15 Potential to Achieve Goals: Good  OT Frequency:     Barriers to D/C:            Co-evaluation              End of Session    Activity Tolerance: Patient tolerated treatment well Patient left: in bed;with call bell/phone within reach;with family/visitor present   Time: 1610-9604 OT Time Calculation (min): 21 min Charges:  OT General Charges $OT Visit: 1 Procedure OT Evaluation $OT Eval Low Complexity: 1 Procedure G-Codes:    Danessa Mensch T Vidit Boissonneault, OTR/L, CLT  Vernor Monnig 06/12/2015, 11:48 AM

## 2015-06-12 NOTE — Consult Note (Signed)
Referring Physician: Allena KatzPatel    Chief Complaint: Left sided numbness  HPI: Debra Myers is an 50 y.o. female who reports that she was at work on the day of admission and had the acute onset of numbness on the end of her tongue (left greater than right).  Patient then began to notice numbness on the lower lip on the left.  She noted some dizziness as well.  Patient then noted some unusual sensation on the left upper and lower extremity.  With no resolution of symptoms and presented to the ED for further evaluation.  Was seen by teleneurology who decided against treatment due to minimal symptoms.  Initial NIHSS of 0. Patient's symptoms have improved at this time and only include her tongue and left side of her lips (upper and lower).    Date last known well: 06/11/2015 Time last known well: Time: 14:00 tPA Given: No: Lack of deficits  Past Medical History  Diagnosis Date  . Depression   . Vaginal delivery     ONE NSVD  . Elective abortion     ONE  . Asthma     ALLERGY INDUCED  . Diabetes mellitus     TYPE 2  . Anxiety   . Hypertension   . Degenerative disc disease   . Degenerative lumbar disc   . GERD (gastroesophageal reflux disease)     takes prilosec     Past Surgical History  Procedure Laterality Date  . Cholecystectomy  2001  . Lapband  04/2012  . Wisdom tooth extraction    . Dilatation & curettage/hysteroscopy with myosure N/A 09/10/2014    Procedure: DILATATION & CURETTAGE/HYSTEROSCOPY WITH MYOSURE/INSERTION OF IUD;  Surgeon: Ok EdwardsJuan H Fernandez, MD;  Location: [facility];  Service: Gynecology;  Laterality: N/A;    Family History  Problem Relation Age of Onset  . Hypertension Father   . Diabetes Father   . Asthma Maternal Grandmother   . Hypertension Mother    Social History:  reports that she has never smoked. She has never used smokeless tobacco. She reports that she does not drink alcohol or use illicit drugs.  Allergies:  Allergies  Allergen Reactions  . Oxycodone  Itching  . Penicillins Other (See Comments)    Reaction:  GI upset  Has patient had a PCN reaction causing immediate rash, facial/tongue/throat swelling, SOB or lightheadedness with hypotension: No Has patient had a PCN reaction causing severe rash involving mucus membranes or skin necrosis: No Has patient had a PCN reaction that required hospitalization No Has patient had a PCN reaction occurring within the last 10 years: No If all of the above answers are "NO", then may proceed with Cephalosporin use.  . Sulfa Antibiotics Itching and Rash    Medications:  I have reviewed the patient's current medications. Prior to Admission:  Prescriptions prior to admission  Medication Sig Dispense Refill Last Dose  . albuterol (PROVENTIL HFA;VENTOLIN HFA) 108 (90 BASE) MCG/ACT inhaler Inhale 2 puffs into the lungs every 6 (six) hours as needed for wheezing or shortness of breath. 1 Inhaler 5 Past Month at Unknown time  . buPROPion (WELLBUTRIN XL) 300 MG 24 hr tablet Take 300 mg by mouth daily.   06/11/2015 at Unknown time  . cetirizine (ZYRTEC) 10 MG tablet Take 10 mg by mouth at bedtime.   06/10/2015 at Unknown time  . fluticasone (FLONASE) 50 MCG/ACT nasal spray Place 2 sprays into both nostrils daily as needed for rhinitis.   Past Week at Unknown time  .  HYDROcodone-acetaminophen (NORCO/VICODIN) 5-325 MG tablet Take 0.5-1 tablets by mouth 2 (two) times daily as needed for moderate pain.   06/11/2015 at 1300  . ibuprofen (ADVIL,MOTRIN) 200 MG tablet Take 800 mg by mouth every 4 (four) hours as needed for headache or mild pain.    Past Week at Unknown time  . ketotifen (ZADITOR) 0.025 % ophthalmic solution Place 1 drop into both eyes 2 (two) times daily as needed (for allergies).   06/11/2015 at Unknown time  . lamoTRIgine (LAMICTAL) 200 MG tablet Take 300 mg by mouth at bedtime.    06/10/2015 at Unknown time  . lisdexamfetamine (VYVANSE) 30 MG capsule Take 30 mg by mouth daily.   06/11/2015 at Unknown time   . LORazepam (ATIVAN) 1 MG tablet Take 0.5-1 mg by mouth every 8 (eight) hours as needed for anxiety or sleep.    06/11/2015 at Unknown time  . metaxalone (SKELAXIN) 800 MG tablet Take 800 mg by mouth 3 (three) times daily as needed for muscle spasms.   06/11/2015 at Unknown time  . metFORMIN (GLUCOPHAGE) 500 MG tablet Take 500 mg by mouth daily with breakfast.   06/11/2015 at Unknown time  . montelukast (SINGULAIR) 10 MG tablet Take 1 tablet (10 mg total) by mouth at bedtime. 30 tablet 7 06/10/2015 at Unknown time  . omeprazole (PRILOSEC) 20 MG capsule Take 1 capsule (20 mg total) by mouth daily. 30 capsule 11 06/11/2015 at Unknown time  . PARoxetine (PAXIL-CR) 25 MG 24 hr tablet Take 50 mg by mouth at bedtime.    06/10/2015 at Unknown time  . spironolactone (ALDACTONE) 25 MG tablet Take 25 mg by mouth daily.   06/11/2015 at Unknown time   Scheduled: . buPROPion  300 mg Oral Daily  . enoxaparin (LOVENOX) injection  40 mg Subcutaneous BID  . insulin aspart  0-5 Units Subcutaneous QHS  . insulin aspart  0-9 Units Subcutaneous TID WC  . lamoTRIgine  300 mg Oral QHS  . montelukast  10 mg Oral QHS  . pantoprazole  40 mg Oral Daily  . PARoxetine  50 mg Oral QHS  . perflutren lipid microspheres (DEFINITY) IV suspension        ROS: History obtained from the patient  General ROS: negative for - chills, fatigue, fever, night sweats, weight gain or weight loss Psychological ROS: negative for - behavioral disorder, hallucinations, memory difficulties, mood swings or suicidal ideation Ophthalmic ROS: negative for - blurry vision, double vision, eye pain or loss of vision ENT ROS: negative for - epistaxis, nasal discharge, oral lesions, sore throat, tinnitus or vertigo Allergy and Immunology ROS: negative for - hives or itchy/watery eyes Hematological and Lymphatic ROS: negative for - bleeding problems, bruising or swollen lymph nodes Endocrine ROS: negative for - galactorrhea, hair pattern changes,  polydipsia/polyuria or temperature intolerance Respiratory ROS: negative for - cough, hemoptysis, shortness of breath or wheezing Cardiovascular ROS: negative for - chest pain, dyspnea on exertion, edema or irregular heartbeat Gastrointestinal ROS: negative for - abdominal pain, diarrhea, hematemesis, nausea/vomiting or stool incontinence Genito-Urinary ROS: negative for - dysuria, hematuria, incontinence or urinary frequency/urgency Musculoskeletal ROS: negative for - joint swelling or muscular weakness Neurological ROS: as noted in HPI Dermatological ROS: negative for rash and skin lesion changes  Physical Examination: Blood pressure 130/69, pulse 94, temperature 98 F (36.7 C), temperature source Oral, resp. rate 18, height 5\' 4"  (1.626 m), weight 113.354 kg (249 lb 14.4 oz), SpO2 99 %.  HEENT-  Normocephalic, no lesions, without obvious abnormality.  Normal external eye and conjunctiva.  Normal TM's bilaterally.  Normal auditory canals and external ears. Normal external nose, mucus membranes and septum.  Normal pharynx. Cardiovascular- S1, S2 normal, pulses palpable throughout   Lungs- chest clear, no wheezing, rales, normal symmetric air entry Abdomen- soft, non-tender; bowel sounds normal; no masses,  no organomegaly Extremities- no edema Lymph-no adenopathy palpable Musculoskeletal-no joint tenderness, deformity or swelling Skin-warm and dry, no hyperpigmentation, vitiligo, or suspicious lesions  Neurological Examination Mental Status: Alert, oriented, thought content appropriate.  Speech fluent without evidence of aphasia.  Able to follow 3 step commands without difficulty. Cranial Nerves: II: Discs flat bilaterally; Visual fields grossly normal, pupils equal, round, reactive to light and accommodation III,IV, VI: ptosis not present, extra-ocular motions intact bilaterally V,VII: smile symmetric, facial light touch sensation decreased on the left side of the lips VIII: hearing  normal bilaterally IX,X: gag reflex present XI: bilateral shoulder shrug XII: midline tongue extension Motor: Right : Upper extremity   5/5    Left:     Upper extremity   5/5  Lower extremity   5/5     Lower extremity   5/5 Tone and bulk:normal tone throughout; no atrophy noted Sensory: Pinprick and light touch decreased on the left side of the mouth and the lateral aspect of the left lower leg Deep Tendon Reflexes: 2+ and symmetric throughout Plantars: Right: downgoing   Left: downgoing Cerebellar: normal finger-to-nose, normal rapid alternating movements and normal heel-to-shin test Gait: not tested due to safety concerns    Laboratory Studies:  Basic Metabolic Panel:  Recent Labs Lab 06/11/15 2011 06/12/15 0421  NA 138  --   K 4.0  --   CL 103  --   CO2 26  --   GLUCOSE 85  --   BUN 12  --   CREATININE 0.83 0.80  CALCIUM 9.0  --     Liver Function Tests:  Recent Labs Lab 06/11/15 2011  AST 17  ALT 25  ALKPHOS 116  BILITOT 0.5  PROT 7.5  ALBUMIN 4.2   No results for input(s): LIPASE, AMYLASE in the last 168 hours. No results for input(s): AMMONIA in the last 168 hours.  CBC:  Recent Labs Lab 06/11/15 2010 06/12/15 0421  WBC 12.4* 11.6*  NEUTROABS 8.3*  --   HGB 13.1 12.0  HCT 39.6 36.0  MCV 83.5 82.0  PLT 287 245    Cardiac Enzymes:  Recent Labs Lab 06/11/15 2010  TROPONINI <0.03    BNP: Invalid input(s): POCBNP  CBG:  Recent Labs Lab 06/12/15 0730 06/12/15 1125  GLUCAP 89 115*    Microbiology: Results for orders placed or performed in visit on 02/12/15  WET PREP FOR TRICH, YEAST, CLUE     Status: None   Collection Time: 02/12/15  3:25 PM  Result Value Ref Range Status   Yeast Wet Prep HPF POC NONE SEEN NONE SEEN Final   Trich, Wet Prep NONE SEEN NONE SEEN Final   Clue Cells Wet Prep HPF POC NONE SEEN NONE SEEN Final   WBC, Wet Prep HPF POC NONE SEEN NONE SEEN Final    Comment: FEW BACTERIA SEEN (7-12) EPITH. CELLS PER  HPF   Urine culture     Status: None   Collection Time: 02/12/15  3:25 PM  Result Value Ref Range Status   Colony Count NO GROWTH  Final   Organism ID, Bacteria NO GROWTH  Final    Coagulation Studies:  Recent Labs  06/11/15 2011  LABPROT 14.4  INR 1.10    Urinalysis:  Recent Labs Lab 06/11/15 2149  COLORURINE YELLOW*  LABSPEC 1.023  PHURINE 6.0  GLUCOSEU NEGATIVE  HGBUR NEGATIVE  BILIRUBINUR NEGATIVE  KETONESUR NEGATIVE  PROTEINUR NEGATIVE  NITRITE NEGATIVE  LEUKOCYTESUR NEGATIVE    Lipid Panel:    Component Value Date/Time   CHOL 160 06/12/2015 0421   TRIG 148 06/12/2015 0421   HDL 43 06/12/2015 0421   CHOLHDL 3.7 06/12/2015 0421   VLDL 30 06/12/2015 0421   LDLCALC 87 06/12/2015 0421    HgbA1C:  Lab Results  Component Value Date   HGBA1C 5.9 06/12/2015    Urine Drug Screen:  No results found for: LABOPIA, COCAINSCRNUR, LABBENZ, AMPHETMU, THCU, LABBARB  Alcohol Level:  Recent Labs Lab 06/11/15 2011  ETH <5    Other results: EKG: sinus tachycardia at 107 bpm.  Imaging: Dg Chest 2 View  06/12/2015  CLINICAL DATA:  TIA EXAM: CHEST  2 VIEW COMPARISON:  04/24/2012 chest radiograph. FINDINGS: Stable cardiomediastinal silhouette with normal heart size. No pneumothorax. No pleural effusion. Lungs appear clear, with no acute consolidative airspace disease and no pulmonary edema. There is a laparoscopic gastric band in the expected location just below the esophagogastric junction with partially visualized tubing extending inferiorly into the ventral abdominal wall. IMPRESSION: No active cardiopulmonary disease. Electronically Signed   By: Delbert Phenix M.D.   On: 06/12/2015 09:09   Ct Head Wo Contrast  06/12/2015  CLINICAL DATA:  Pt states she began experiencing tingling in lip/tongue yesterday, left sided weakness yesterday has since subsided. Pt states she experienced some right sided chest pain through to back yesterday but it may be reflux related. Pt  states hx of anxiety, reflux, severe back pain. Pt denies vision or balance changes. No hx trauma. No hx stroke. EXAM: CT HEAD WITHOUT CONTRAST TECHNIQUE: Contiguous axial images were obtained from the base of the skull through the vertex without intravenous contrast. COMPARISON:  06/11/2015 FINDINGS: The ventricles are normal in size and configuration. There are no parenchymal masses or mass effect, no evidence of an infarct, no extra-axial masses or abnormal fluid collections and no evidence of intracranial hemorrhage. Visualized sinuses and mastoid air cells are clear. No skull lesion. IMPRESSION: Normal unenhanced CT scan of the brain. Electronically Signed   By: Amie Portland M.D.   On: 06/12/2015 10:12   Ct Head Wo Contrast  06/11/2015  CLINICAL DATA:  Code stroke. Tongue and lower lip numbness for 3 hours. EXAM: CT HEAD WITHOUT CONTRAST TECHNIQUE: Contiguous axial images were obtained from the base of the skull through the vertex without intravenous contrast. COMPARISON:  None. FINDINGS: Brain: No evidence of acute infarction, hemorrhage, extra-axial collection, ventriculomegaly, or mass effect. Vascular: No hyperdense vessel or unexpected calcification. Skull: Negative for fracture or focal lesion. Sinuses/Orbits: No acute findings. Other: None. IMPRESSION: No acute intracranial abnormality. Electronically Signed   By: Rubye Oaks M.D.   On: 06/11/2015 20:20   Mr Brain Wo Contrast  06/12/2015  CLINICAL DATA:  Patient presents with an episode of feeling funny, earlier today, with transient LEFT upper and lower extremity weakness. No current reported neurologic deficit. EXAM: MRI HEAD WITHOUT CONTRAST MRA HEAD WITHOUT CONTRAST TECHNIQUE: Multiplanar, multiecho pulse sequences of the brain and surrounding structures were obtained without intravenous contrast. Angiographic images of the head were obtained using MRA technique without contrast. COMPARISON:  CT head 06/11/2015 and 06/12/2015. FINDINGS:  MRI HEAD FINDINGS The patient was unable to remain motionless for the exam.  Small or subtle lesions could be overlooked. No evidence for acute infarction, hemorrhage, mass lesion, hydrocephalus, or extra-axial fluid. Normal cerebral volume. No definite white matter disease. Pituitary, pineal, and cerebellar tonsils unremarkable. No upper cervical lesions. Flow voids are maintained throughout the carotid, basilar, and vertebral arteries. There are no areas of chronic hemorrhage. Visualized calvarium, skull base, and upper cervical osseous structures unremarkable. Scalp and extracranial soft tissues, orbits, sinuses, and mastoids show no acute process. Compared with recent CT, similar appearance. MRA HEAD FINDINGS The internal carotid arteries are widely patent. The basilar artery is widely patent. No M1 stenosis or dissection. Dominant RIGHT A1 ACA with mild to moderate disease of the LEFT A1 and M2 ACA segments. Basilar artery widely patent with RIGHT vertebral dominant. No PCA stenosis. No cerebellar branch occlusion. No visible intracranial aneurysm. IMPRESSION: Motion degraded exam demonstrating no acute or focal intracranial abnormality. No intracranial stenosis or occlusion of significance is observed. LEFT A1 and A2 ACA disease, doubtful significance. Electronically Signed   By: Elsie Stain M.D.   On: 06/12/2015 16:24   Mr Maxine Glenn Head/brain Wo Cm  06/12/2015  CLINICAL DATA:  Patient presents with an episode of feeling funny, earlier today, with transient LEFT upper and lower extremity weakness. No current reported neurologic deficit. EXAM: MRI HEAD WITHOUT CONTRAST MRA HEAD WITHOUT CONTRAST TECHNIQUE: Multiplanar, multiecho pulse sequences of the brain and surrounding structures were obtained without intravenous contrast. Angiographic images of the head were obtained using MRA technique without contrast. COMPARISON:  CT head 06/11/2015 and 06/12/2015. FINDINGS: MRI HEAD FINDINGS The patient was unable to  remain motionless for the exam. Small or subtle lesions could be overlooked. No evidence for acute infarction, hemorrhage, mass lesion, hydrocephalus, or extra-axial fluid. Normal cerebral volume. No definite white matter disease. Pituitary, pineal, and cerebellar tonsils unremarkable. No upper cervical lesions. Flow voids are maintained throughout the carotid, basilar, and vertebral arteries. There are no areas of chronic hemorrhage. Visualized calvarium, skull base, and upper cervical osseous structures unremarkable. Scalp and extracranial soft tissues, orbits, sinuses, and mastoids show no acute process. Compared with recent CT, similar appearance. MRA HEAD FINDINGS The internal carotid arteries are widely patent. The basilar artery is widely patent. No M1 stenosis or dissection. Dominant RIGHT A1 ACA with mild to moderate disease of the LEFT A1 and M2 ACA segments. Basilar artery widely patent with RIGHT vertebral dominant. No PCA stenosis. No cerebellar branch occlusion. No visible intracranial aneurysm. IMPRESSION: Motion degraded exam demonstrating no acute or focal intracranial abnormality. No intracranial stenosis or occlusion of significance is observed. LEFT A1 and A2 ACA disease, doubtful significance. Electronically Signed   By: Elsie Stain M.D.   On: 06/12/2015 16:24    Assessment: 50 y.o. female presenting with left sided numbness including tongue and lips.  Patient continues to have lip and tongue numbness but remaining symptoms have resolved.  MRI of the brain personally reviewed and although with movement artifact shows no evidence of acute infarct.  MRA shows no evidence of aneurysm.  Echocardiogram shows an EF of 60-65% with no cardiac source identified.  A1c 5.9.  LDL 87.   Patient with negative work up but with vascular risk factors.     Stroke Risk Factors - hyperlipidemia and hypertension  Plan: 1. PT consult, OT consult, Speech consult 2. Carotid dopplers since patient unable to  tolerate MRI long enough to obtain MRA of the neck 3. Prophylactic therapy-Antiplatelet med: Aspirin - dose 81mg  daily 4. Telemetry monitoring 5. Frequent neuro checks  Thana Farr, MD Neurology [phone] 06/12/2015, 4:46 PM

## 2015-06-12 NOTE — Evaluation (Addendum)
Physical Therapy Evaluation Patient Details Name: Debra Myers MRN: [id number] DOB: [birth year] Today's Date: 06/12/2015   History of Present Illness  Pt is a 50 y.o. female presenting to hospital with perioral and lingual numbness and transient L UE and LE weakness.  PMH includes DM, htn, depression.  Clinical Impression  Pt demonstrates steady independent functional mobility without AD.  Pt scored 28/28 on Tinetti balance assessment indicating pt is at low risk for falls.  Pt still c/o tingling/numbness B left lips and tip/lateral left portion of tongue but reports L side (UE and LE) at baseline for her. Pt's HR increased from 95-126 bpm with activity but decreased to low 100's with rest.  No acute PT needs identified; will discharge pt in house and complete PT order.  Please re-consult PT if any PT needs arise.    Follow Up Recommendations No PT follow up    Equipment Recommendations  None recommended by PT    Recommendations for Other Services       Precautions / Restrictions Precautions Precautions:  (Moderate fall risk) Restrictions Weight Bearing Restrictions: No      Mobility  Bed Mobility Overal bed mobility: Independent             General bed mobility comments: Supine to/from sit  Transfers Overall transfer level: Independent Equipment used: None             General transfer comment: steady without loss of balance  Ambulation/Gait Ambulation/Gait assistance: Independent Ambulation Distance (Feet): 220 Feet Assistive device: None Gait Pattern/deviations: WFL(Within Functional Limits);Step-through pattern Gait velocity: normal Gait velocity interpretation: at or above normal speed for age/gender General Gait Details: steady without loss of balance; no loss of balance with ambulation and head turns R/L/up/down, increasing/decreasing speed, and turning and stopping  Stairs Stairs: Yes Stairs assistance: Modified independent (Device/Increase  time) Stair Management: One rail Left;Alternating pattern;Forwards Number of Stairs: 6 General stair comments: steady without loss of balance  Wheelchair Mobility    Modified Rankin (Stroke Patients Only)       Balance Overall balance assessment: Independent                               Standardized Balance Assessment Standardized Balance Assessment :  (Pt scored 28/28 on Tinetti balance assessment)           Pertinent Vitals/Pain Pain Assessment: 0-10 Pain Score: 5  Pain Location: chronic L low back pain Pain Descriptors / Indicators: Aching Pain Intervention(s): Limited activity within patient's tolerance;Monitored during session;Repositioned;Patient requesting pain meds-RN notified    Home Living Family/patient expects to be discharged to:: Private residence Living Arrangements: Spouse/significant other;Children Available Help at Discharge: Family Type of Home: House Home Access: Stairs to enter Entrance Stairs-Rails: None Entrance Stairs-Number of Steps: 2 Home Layout: One level Home Equipment: None      Prior Function Level of Independence: Independent         Comments: Nursing occupation.     Hand Dominance        Extremity/Trunk Assessment   Upper Extremity Assessment:  (5/5 B shoulder flexion, shoulder abduction, elbow flexion, elbow extesion; equal strong B grip strengths; no pronator drift noted; decreased sensation L lateral UE compared to R UE (pt reports having this chronic d/t neck issues))           Lower Extremity Assessment: RLE deficits/detail;LLE deficits/detail RLE Deficits / Details: hip flexion, knee flexion/extension, and DF all 4+/5; intact  sensation, proprioception, and coordination LLE Deficits / Details: L hip flexion 4-/5 (d/t back pain); knee flexion/extension 4+/5; DF 4+/5; intact proprioception and coordination  Cervical / Trunk Assessment: Normal  Communication   Communication: No difficulties   Cognition Arousal/Alertness: Awake/alert Behavior During Therapy: WFL for tasks assessed/performed Overall Cognitive Status: Within Functional Limits for tasks assessed                      General Comments General comments (skin integrity, edema, etc.): Pt laying in bed with husband present beginning of session.    Exercises        Assessment/Plan    PT Assessment Patent does not need any further PT services  PT Diagnosis     PT Problem List    PT Treatment Interventions     PT Goals (Current goals can be found in the Care Plan section) Acute Rehab PT Goals Patient Stated Goal: to go home PT Goal Formulation: With patient Time For Goal Achievement: 06/26/15 Potential to Achieve Goals: Good    Frequency     Barriers to discharge        Co-evaluation               End of Session Equipment Utilized During Treatment: Gait belt Activity Tolerance: Patient tolerated treatment well Patient left: in bed;with call bell/phone within reach;with family/visitor present Nurse Communication: Mobility status;Patient requests pain meds    Functional Assessment Tool Used: AM-PAC with stairs Functional Limitation: Mobility: Walking and moving around Mobility: Walking and Moving Around Current Status [phone](G8978): 0 percent impaired, limited or restricted Mobility: Walking and Moving Around Goal Status [phone](G8979): 0 percent impaired, limited or restricted Mobility: Walking and Moving Around Discharge Status [phone](G8980): 0 percent impaired, limited or restricted    Time: 0950-1017 PT Time Calculation (min) (ACUTE ONLY): 27 min   Charges:   PT Evaluation $PT Eval Low Complexity: 1 Procedure     PT G Codes:   PT G-Codes **NOT FOR INPATIENT CLASS** Functional Assessment Tool Used: AM-PAC with stairs Functional Limitation: Mobility: Walking and moving around Mobility: Walking and Moving Around Current Status ([id number](G8978): 0 percent impaired, limited or restricted Mobility: Walking  and Moving Around Goal Status ([id number](G8979): 0 percent impaired, limited or restricted Mobility: Walking and Moving Around Discharge Status ([id number](G8980): 0 percent impaired, limited or restricted    Hendricks Limesmily Milfred Krammes 06/12/2015, 10:42 AM Hendricks LimesEmily Adonnis Salceda, PT [phone]  AddendumSherron Monday:  Spoke with MD Allena KatzPatel prior to PT session regarding PT order for start time tomorrow and MD reports to start PT today (instead of tomorrow). Hendricks LimesEmily Norely Schlick, PT [phone]

## 2015-06-12 NOTE — Progress Notes (Signed)
Speech Therapy Note: received order, reviewed chart notes and consulted NSG then met w/ pt and husband. Pt denied any difficulty swallowing; has been tolerating meals and meds per NSG w/ noted problems. Pt conversed at conversational level w/ a quick rate of speech; appeared min. nervous re: upcoming MRI. No deficits of speech or language noted during lengthy conversation w/ pt in room. Pt only c/o min. tingling in left corner of lip and lateral tongue are which have been improving since yesterday - no impact on articulation of speech noted.  ST will sign off at this time w/ NSG to reconsult if any change in status while admitted. Pt agreed.

## 2015-06-12 NOTE — Progress Notes (Signed)
*  PRELIMINARY RESULTS* Echocardiogram 2D Echocardiogram has been performed.  Georgann HousekeeperJerry R Hege 06/12/2015, 1:03 PM

## 2015-06-12 NOTE — Progress Notes (Signed)
MD making rounds. Discharge orders received. IV discontinued. Telemetry discontinued.Prescription E-Scribed to pharmacy. Provided with Education Handouts. Discharge paperwork provided, explained, signed and witnessed. No unanswered questions. Discharged via wheelchair by Nursing Staff. Belongings sent with patient and family.

## 2015-06-13 ENCOUNTER — Other Ambulatory Visit: Payer: Self-pay | Admitting: Family Medicine

## 2015-06-13 ENCOUNTER — Telehealth: Payer: Self-pay | Admitting: *Deleted

## 2015-06-13 NOTE — Telephone Encounter (Signed)
Transition Care Management Follow-up Telephone Call   Date discharged? 06/12/15   How have you been since you were released from the hospital? Doing well - feeling much better.  Still c/o intermittent tongue/lip numbness   Do you understand why you were in the hospital? yes   Do you understand the discharge instructions? yes   Where were you discharged to? home   Items Reviewed:  Medications reviewed: yes  Allergies reviewed: yes  Dietary changes reviewed: no  Referrals reviewed: none   Functional Questionnaire:   Activities of Daily Living (ADLs):   She states they are independent in the following: ambulation, bathing and hygiene, feeding, continence, grooming, toileting and dressing States they require assistance with the following: none   Any transportation issues/concerns?: no   Any patient concerns? no   Confirmed importance and date/time of follow-up visits scheduled yes, 06/17/15 @ 0915  Provider Appointment booked with Ruthe Mannanalia Aron, MD  Confirmed with patient if condition begins to worsen call PCP or go to the ER.  Patient was given the office number and encouraged to call back with question or concerns.  : yes

## 2015-06-17 ENCOUNTER — Ambulatory Visit (INDEPENDENT_AMBULATORY_CARE_PROVIDER_SITE_OTHER): Payer: Managed Care, Other (non HMO) | Admitting: Family Medicine

## 2015-06-17 ENCOUNTER — Encounter: Payer: Self-pay | Admitting: Family Medicine

## 2015-06-17 VITALS — BP 124/84 | HR 84 | Temp 97.8°F | Wt 256.2 lb

## 2015-06-17 DIAGNOSIS — E119 Type 2 diabetes mellitus without complications: Secondary | ICD-10-CM | POA: Diagnosis not present

## 2015-06-17 DIAGNOSIS — I10 Essential (primary) hypertension: Secondary | ICD-10-CM

## 2015-06-17 DIAGNOSIS — G459 Transient cerebral ischemic attack, unspecified: Secondary | ICD-10-CM

## 2015-06-17 NOTE — Progress Notes (Signed)
Subjective:   Patient ID: Debra Myers, female    DOB: [birth year], 50 y.o.   MRN: [id number]  Debra Myers is a pleasant 50 y.o. year old female who presents to clinic today with Hospitalization Follow-up  on 06/17/2015  HPI:  [facility] 06[facility]12[facility]. Notes reviewed.  Presented with an episode of "feeling funny," followed by transient left sided weakness and perioral numbness. Code stroke was called in ED.  MRI/MRA neg. Neuro consulted. PT/OT speech were also consulted.  Advised to continue ASA 81 mg at discharge.  Overall, symptoms have resolved but her tongue and mouth still feel " a little funny."  Lab Results  Component Value Date   CHOL 160 06/12/2015   HDL 43 06/12/2015   LDLCALC 87 06/12/2015   TRIG 148 06/12/2015   CHOLHDL 3.7 06/12/2015     Dg Chest 2 View  06/12/2015  CLINICAL DATA:  TIA EXAM: CHEST  2 VIEW COMPARISON:  04/24/2012 chest radiograph. FINDINGS: Stable cardiomediastinal silhouette with normal heart size. No pneumothorax. No pleural effusion. Lungs appear clear, with no acute consolidative airspace disease and no pulmonary edema. There is a laparoscopic gastric band in the expected location just below the esophagogastric junction with partially visualized tubing extending inferiorly into the ventral abdominal wall. IMPRESSION: No active cardiopulmonary disease. Electronically Signed   By: Delbert PhenixJason A Poff M.D.   On: 06/12/2015 09:09   Ct Head Wo Contrast  06/12/2015  CLINICAL DATA:  Pt states she began experiencing tingling in lip/tongue yesterday, left sided weakness yesterday has since subsided. Pt states she experienced some right sided chest pain through to back yesterday but it may be reflux related. Pt states hx of anxiety, reflux, severe back pain. Pt denies vision or balance changes. No hx trauma. No hx stroke. EXAM: CT HEAD WITHOUT CONTRAST TECHNIQUE: Contiguous axial images were obtained from the base of the skull through the vertex without  intravenous contrast. COMPARISON:  06/11/2015 FINDINGS: The ventricles are normal in size and configuration. There are no parenchymal masses or mass effect, no evidence of an infarct, no extra-axial masses or abnormal fluid collections and no evidence of intracranial hemorrhage. Visualized sinuses and mastoid air cells are clear. No skull lesion. IMPRESSION: Normal unenhanced CT scan of the brain. Electronically Signed   By: Amie Portlandavid  Ormond M.D.   On: 06/12/2015 10:12   Ct Head Wo Contrast  06/11/2015  CLINICAL DATA:  Code stroke. Tongue and lower lip numbness for 3 hours. EXAM: CT HEAD WITHOUT CONTRAST TECHNIQUE: Contiguous axial images were obtained from the base of the skull through the vertex without intravenous contrast. COMPARISON:  None. FINDINGS: Brain: No evidence of acute infarction, hemorrhage, extra-axial collection, ventriculomegaly, or mass effect. Vascular: No hyperdense vessel or unexpected calcification. Skull: Negative for fracture or focal lesion. Sinuses/Orbits: No acute findings. Other: None. IMPRESSION: No acute intracranial abnormality. Electronically Signed   By: Rubye OaksMelanie  Ehinger M.D.   On: 06/11/2015 20:20   Mr Brain Wo Contrast  06/12/2015  CLINICAL DATA:  Patient presents with an episode of feeling funny, earlier today, with transient LEFT upper and lower extremity weakness. No current reported neurologic deficit. EXAM: MRI HEAD WITHOUT CONTRAST MRA HEAD WITHOUT CONTRAST TECHNIQUE: Multiplanar, multiecho pulse sequences of the brain and surrounding structures were obtained without intravenous contrast. Angiographic images of the head were obtained using MRA technique without contrast. COMPARISON:  CT head 06/11/2015 and 06/12/2015. FINDINGS: MRI HEAD FINDINGS The patient was unable to remain motionless for the exam. Small or subtle  lesions could be overlooked. No evidence for acute infarction, hemorrhage, mass lesion, hydrocephalus, or extra-axial fluid. Normal cerebral volume. No  definite white matter disease. Pituitary, pineal, and cerebellar tonsils unremarkable. No upper cervical lesions. Flow voids are maintained throughout the carotid, basilar, and vertebral arteries. There are no areas of chronic hemorrhage. Visualized calvarium, skull base, and upper cervical osseous structures unremarkable. Scalp and extracranial soft tissues, orbits, sinuses, and mastoids show no acute process. Compared with recent CT, similar appearance. MRA HEAD FINDINGS The internal carotid arteries are widely patent. The basilar artery is widely patent. No M1 stenosis or dissection. Dominant RIGHT A1 ACA with mild to moderate disease of the LEFT A1 and M2 ACA segments. Basilar artery widely patent with RIGHT vertebral dominant. No PCA stenosis. No cerebellar branch occlusion. No visible intracranial aneurysm. IMPRESSION: Motion degraded exam demonstrating no acute or focal intracranial abnormality. No intracranial stenosis or occlusion of significance is observed. LEFT A1 and A2 ACA disease, doubtful significance. Electronically Signed   By: Elsie Stain M.D.   On: 06/12/2015 16:24   Mr Maxine Glenn Head/brain Wo Cm  06/12/2015  CLINICAL DATA:  Patient presents with an episode of feeling funny, earlier today, with transient LEFT upper and lower extremity weakness. No current reported neurologic deficit. EXAM: MRI HEAD WITHOUT CONTRAST MRA HEAD WITHOUT CONTRAST TECHNIQUE: Multiplanar, multiecho pulse sequences of the brain and surrounding structures were obtained without intravenous contrast. Angiographic images of the head were obtained using MRA technique without contrast. COMPARISON:  CT head 06/11/2015 and 06/12/2015. FINDINGS: MRI HEAD FINDINGS The patient was unable to remain motionless for the exam. Small or subtle lesions could be overlooked. No evidence for acute infarction, hemorrhage, mass lesion, hydrocephalus, or extra-axial fluid. Normal cerebral volume. No definite white matter disease. Pituitary,  pineal, and cerebellar tonsils unremarkable. No upper cervical lesions. Flow voids are maintained throughout the carotid, basilar, and vertebral arteries. There are no areas of chronic hemorrhage. Visualized calvarium, skull base, and upper cervical osseous structures unremarkable. Scalp and extracranial soft tissues, orbits, sinuses, and mastoids show no acute process. Compared with recent CT, similar appearance. MRA HEAD FINDINGS The internal carotid arteries are widely patent. The basilar artery is widely patent. No M1 stenosis or dissection. Dominant RIGHT A1 ACA with mild to moderate disease of the LEFT A1 and M2 ACA segments. Basilar artery widely patent with RIGHT vertebral dominant. No PCA stenosis. No cerebellar branch occlusion. No visible intracranial aneurysm. IMPRESSION: Motion degraded exam demonstrating no acute or focal intracranial abnormality. No intracranial stenosis or occlusion of significance is observed. LEFT A1 and A2 ACA disease, doubtful significance. Electronically Signed   By: Elsie Stain M.D.   On: 06/12/2015 16:24   Current Outpatient Prescriptions on File Prior to Visit  Medication Sig Dispense Refill  . albuterol (PROVENTIL HFA;VENTOLIN HFA) 108 (90 BASE) MCG/ACT inhaler Inhale 2 puffs into the lungs every 6 (six) hours as needed for wheezing or shortness of breath. 1 Inhaler 5  . aspirin EC 81 MG tablet Take 1 tablet (81 mg total) by mouth daily. 30 tablet 2  . buPROPion (WELLBUTRIN XL) 300 MG 24 hr tablet Take 300 mg by mouth daily.    . cetirizine (ZYRTEC) 10 MG tablet Take 10 mg by mouth at bedtime.    . fluticasone (FLONASE) 50 MCG/ACT nasal spray Place 2 sprays into both nostrils daily as needed for rhinitis.    Marland Kitchen HYDROcodone-acetaminophen (NORCO/VICODIN) 5-325 MG tablet Take 0.5-1 tablets by mouth 2 (two) times daily as needed for moderate  pain.    . ibuprofen (ADVIL,MOTRIN) 200 MG tablet Take 800 mg by mouth every 4 (four) hours as needed for headache or mild pain.      Marland Kitchen ketotifen (ZADITOR) 0.025 % ophthalmic solution Place 1 drop into both eyes 2 (two) times daily as needed (for allergies).    Marland Kitchen lamoTRIgine (LAMICTAL) 200 MG tablet Take 300 mg by mouth at bedtime.     Marland Kitchen lisdexamfetamine (VYVANSE) 30 MG capsule Take 30 mg by mouth daily.    Marland Kitchen LORazepam (ATIVAN) 1 MG tablet Take 0.5-1 mg by mouth every 8 (eight) hours as needed for anxiety or sleep.     . metaxalone (SKELAXIN) 800 MG tablet Take 800 mg by mouth 3 (three) times daily as needed for muscle spasms.    . metFORMIN (GLUCOPHAGE) 500 MG tablet Take 500 mg by mouth daily with breakfast.    . montelukast (SINGULAIR) 10 MG tablet Take 1 tablet (10 mg total) by mouth at bedtime. 30 tablet 7  . omeprazole (PRILOSEC) 20 MG capsule Take 1 capsule (20 mg total) by mouth daily. 30 capsule 11  . PARoxetine (PAXIL-CR) 25 MG 24 hr tablet Take 50 mg by mouth at bedtime.     Marland Kitchen spironolactone (ALDACTONE) 25 MG tablet Take 25 mg by mouth daily.    Marland Kitchen spironolactone (ALDACTONE) 25 MG tablet take 1 tablet by mouth once daily 30 tablet 0   No current facility-administered medications on file prior to visit.    Allergies  Allergen Reactions  . Oxycodone Itching  . Penicillins Other (See Comments)    Reaction:  GI upset  Has patient had a PCN reaction causing immediate rash, facial/tongue/throat swelling, SOB or lightheadedness with hypotension: No Has patient had a PCN reaction causing severe rash involving mucus membranes or skin necrosis: No Has patient had a PCN reaction that required hospitalization No Has patient had a PCN reaction occurring within the last 10 years: No If all of the above answers are "NO", then may proceed with Cephalosporin use.  . Sulfa Antibiotics Itching and Rash    Past Medical History  Diagnosis Date  . Depression   . Vaginal delivery     ONE NSVD  . Elective abortion     ONE  . Asthma     ALLERGY INDUCED  . Diabetes mellitus     TYPE 2  . Anxiety   . Hypertension   .  Degenerative disc disease   . Degenerative lumbar disc   . GERD (gastroesophageal reflux disease)     takes prilosec     Past Surgical History  Procedure Laterality Date  . Cholecystectomy  2001  . Lapband  04/2012  . Wisdom tooth extraction    . Dilatation & curettage/hysteroscopy with myosure N/A 09/10/2014    Procedure: DILATATION & CURETTAGE/HYSTEROSCOPY WITH MYOSURE/INSERTION OF IUD;  Surgeon: Ok Edwards, MD;  Location: [facility];  Service: Gynecology;  Laterality: N/A;    Family History  Problem Relation Age of Onset  . Hypertension Father   . Diabetes Father   . Asthma Maternal Grandmother   . Hypertension Mother     Social History   Social History  . Marital Status: Married    Spouse Name: N/A  . Number of Children: N/A  . Years of Education: N/A   Occupational History  . Not on file.   Social History Main Topics  . Smoking status: Never Smoker   . Smokeless tobacco: Never Used  . Alcohol Use: No  Comment: rarely  . Drug Use: No  . Sexual Activity: Yes    Birth Control/ Protection: Pill   Other Topics Concern  . Not on file   Social History Narrative   The PMH, PSH, Social History, Family History, Medications, and allergies have been reviewed in [facility], and have been updated if relevant.   Review of Systems  Constitutional: Negative.   HENT: Negative.   Eyes: Negative.   Respiratory: Negative.   Cardiovascular: Negative.   Gastrointestinal: Negative.   Endocrine: Negative.   Genitourinary: Negative.   Musculoskeletal: Negative.   Skin: Negative.   Allergic/Immunologic: Negative.   Neurological: Positive for numbness.  Hematological: Negative.   Psychiatric/Behavioral: Negative.   All other systems reviewed and are negative.      Objective:    BP 124/84 mmHg  Pulse 84  Temp(Src) 97.8 F (36.6 C) (Oral)  Wt 256 lb 4 oz (116.234 kg)  SpO2 97%   Physical Exam  Constitutional: She is oriented to person, place, and time. She appears  well-developed and well-nourished. No distress.  HENT:  Head: Normocephalic.  Eyes: Conjunctivae are normal.  Neck: Normal range of motion.  Cardiovascular: Normal rate and regular rhythm.   Pulmonary/Chest: Effort normal and breath sounds normal.  Musculoskeletal: Normal range of motion.  Neurological: She is alert and oriented to person, place, and time. She has normal reflexes. She displays normal reflexes. No cranial nerve deficit. She exhibits normal muscle tone. Coordination normal.  Grip strength symmetrical   Skin: Skin is warm and dry. She is not diaphoretic.  Psychiatric: She has a normal mood and affect. Her behavior is normal. Judgment and thought content normal.          Assessment & Plan:   Transient cerebral ischemia, unspecified transient cerebral ischemia type  Type 2 diabetes mellitus without complication, without long-term current use of insulin (HCC)  Essential hypertension No Follow-up on file.

## 2015-06-17 NOTE — Assessment & Plan Note (Addendum)
New- persistent mild numbness in perioral area. Exam otherwise reassuring. Continue ASA 81 mg daily. Neurology referral placed. The patient indicates understanding of these issues and agrees with the plan.

## 2015-06-17 NOTE — Patient Instructions (Addendum)
Happy Birthday! Please continue taking your aspirin 81 mg daily. Keep your appointment with neurology.  Happy birthday!

## 2015-06-17 NOTE — Progress Notes (Signed)
Pre visit review using our clinic review tool, if applicable. No additional management support is needed unless otherwise documented below in the visit note. 

## 2015-06-27 ENCOUNTER — Encounter: Payer: Self-pay | Admitting: Gynecology

## 2015-06-27 ENCOUNTER — Ambulatory Visit (INDEPENDENT_AMBULATORY_CARE_PROVIDER_SITE_OTHER): Payer: Managed Care, Other (non HMO) | Admitting: Gynecology

## 2015-06-27 VITALS — BP 132/82 | Ht 64.0 in | Wt 249.8 lb

## 2015-06-27 DIAGNOSIS — E663 Overweight: Secondary | ICD-10-CM | POA: Diagnosis not present

## 2015-06-27 DIAGNOSIS — Z01419 Encounter for gynecological examination (general) (routine) without abnormal findings: Secondary | ICD-10-CM

## 2015-06-27 DIAGNOSIS — R102 Pelvic and perineal pain: Secondary | ICD-10-CM | POA: Diagnosis not present

## 2015-06-27 DIAGNOSIS — N83201 Unspecified ovarian cyst, right side: Secondary | ICD-10-CM

## 2015-06-27 NOTE — Progress Notes (Signed)
Debra Myers Nov 02, 1965 [id number]   History:    50 y.o. who was seen the office in December last year complaining vitamin right lower quadrant discomfort and was seen by her nurse practitioner who had ordered an ultrasound. The ultrasound demonstrated the following: T/V anteverted uterus with IUD seen in normal position. Right ovary rim of tissue seen with thin-walled echo-free avascular cyst 43 x 48 x 37 mean 43 mm. Avascular. Left ovary normal. Negative cul-de-sac. Endometrium 3.2 mm. Patient was instructed to return to the office in 2-3 months for follow-up ultrasound and did not do so. Most recently she was seen in emergency room and had been diagnosed with a TIA Anes in the process of following up with a neurologist.  In July 2016 patient underwent an outpatient resectoscopic polypectomy as a result of dysfunction uterine bleeding and endometrial polyp and had a Mirena IUD placed per she has done well as to her cycles. Patient had gastric banding March 2014. Patient with no past history of abnormal Pap smears reported.  Past medical history,surgical history, family history and social history were all reviewed and documented in the EPIC chart.  Gynecologic History No LMP recorded. Patient is not currently having periods (Reason: IUD). Contraception: IUD Last Pap: 2015. Results were: normal Last mammogram: 2016. Results were: normal  Obstetric History OB History  Gravida Para Term Preterm AB SAB TAB Ectopic Multiple Living  2 1 1  1 1    1     # Outcome Date GA Lbr Len/2nd Weight Sex Delivery Anes PTL Lv  2 SAB           1 Term     F Vag-Spont   Y       ROS: A ROS was performed and pertinent positives and negatives are included in the history.  GENERAL: No fevers or chills. HEENT: No change in vision, no earache, sore throat or sinus congestion. NECK: No pain or stiffness. CARDIOVASCULAR: No chest pain or pressure. No palpitations. PULMONARY: No shortness of breath, cough or  wheeze. GASTROINTESTINAL: No abdominal pain, nausea, vomiting or diarrhea, melena or bright red blood per rectum. GENITOURINARY: No urinary frequency, urgency, hesitancy or dysuria. MUSCULOSKELETAL: No joint or muscle pain, no back pain, no recent trauma. DERMATOLOGIC: No rash, no itching, no lesions. ENDOCRINE: No polyuria, polydipsia, no heat or cold intolerance. No recent change in weight. HEMATOLOGICAL: No anemia or easy bruising or bleeding. NEUROLOGIC: No headache, seizures, numbness, tingling or weakness. PSYCHIATRIC: No depression, no loss of interest in normal activity or change in sleep pattern.     Exam: chaperone present  BP 132/82 mmHg  Ht 5\' 4"  (1.626 m)  Wt 249 lb 12.8 oz (113.309 kg)  BMI 42.86 kg/m2  Body mass index is 42.86 kg/(m^2).  General appearance : Well developed well nourished female. No acute distress HEENT: Eyes: no retinal hemorrhage or exudates,  Neck supple, trachea midline, no carotid bruits, no thyroidmegaly Lungs: Clear to auscultation, no rhonchi or wheezes, or rib retractions  Heart: Regular rate and rhythm, no murmurs or gallops Breast:Examined in sitting and supine position were symmetrical in appearance, no palpable masses or tenderness,  no skin retraction, no nipple inversion, no nipple discharge, no skin discoloration, no axillary or supraclavicular lymphadenopathy Abdomen: no palpable masses or tenderness, no rebound or guarding Extremities: no edema or skin discoloration or tenderness  Pelvic:  Bartholin, Urethra, Skene Glands: Within normal limits             Vagina:  No gross lesions or discharge  Cervix: No gross lesions or discharge  Uterus  anteverted, normal size, shape and consistency, non-tender and mobile  Adnexa  difficult to evaluate the patient's abdominal girth but no pain elicited  Anus and perineum  normal   Rectovaginal  normal sphincter tone without palpated masses or tenderness             Hemoccult not indicated      Assessment/Plan:  50 y.o. female for annual exam patient to return back to the office next week for long overdue pelvic ultrasound to see the been resolution of the right ovarian cyst. Her PCP Dr. Dayton Martes has been doing her blood work. Patient was reminded to schedule her mammogram for this year. Pap smear not indicated this year.   Ok Edwards MD, 2:15 PM 06/27/2015

## 2015-06-30 ENCOUNTER — Ambulatory Visit: Payer: Managed Care, Other (non HMO) | Admitting: Gynecology

## 2015-07-11 ENCOUNTER — Encounter: Payer: Self-pay | Admitting: Gynecology

## 2015-07-11 ENCOUNTER — Encounter: Payer: Self-pay | Admitting: Neurology

## 2015-07-11 ENCOUNTER — Ambulatory Visit (INDEPENDENT_AMBULATORY_CARE_PROVIDER_SITE_OTHER): Payer: Managed Care, Other (non HMO) | Admitting: Gynecology

## 2015-07-11 ENCOUNTER — Other Ambulatory Visit: Payer: Self-pay | Admitting: Gynecology

## 2015-07-11 ENCOUNTER — Ambulatory Visit (INDEPENDENT_AMBULATORY_CARE_PROVIDER_SITE_OTHER): Payer: Managed Care, Other (non HMO)

## 2015-07-11 ENCOUNTER — Ambulatory Visit (INDEPENDENT_AMBULATORY_CARE_PROVIDER_SITE_OTHER): Payer: Managed Care, Other (non HMO) | Admitting: Neurology

## 2015-07-11 VITALS — BP 128/80

## 2015-07-11 VITALS — BP 140/78 | HR 102 | Ht 64.0 in | Wt 249.0 lb

## 2015-07-11 DIAGNOSIS — G459 Transient cerebral ischemic attack, unspecified: Secondary | ICD-10-CM | POA: Diagnosis not present

## 2015-07-11 DIAGNOSIS — I10 Essential (primary) hypertension: Secondary | ICD-10-CM | POA: Diagnosis not present

## 2015-07-11 DIAGNOSIS — N83202 Unspecified ovarian cyst, left side: Secondary | ICD-10-CM

## 2015-07-11 DIAGNOSIS — E119 Type 2 diabetes mellitus without complications: Secondary | ICD-10-CM | POA: Diagnosis not present

## 2015-07-11 DIAGNOSIS — N838 Other noninflammatory disorders of ovary, fallopian tube and broad ligament: Secondary | ICD-10-CM

## 2015-07-11 DIAGNOSIS — N83201 Unspecified ovarian cyst, right side: Secondary | ICD-10-CM | POA: Diagnosis not present

## 2015-07-11 DIAGNOSIS — N839 Noninflammatory disorder of ovary, fallopian tube and broad ligament, unspecified: Secondary | ICD-10-CM

## 2015-07-11 DIAGNOSIS — E663 Overweight: Secondary | ICD-10-CM | POA: Diagnosis not present

## 2015-07-11 DIAGNOSIS — R102 Pelvic and perineal pain unspecified side: Secondary | ICD-10-CM

## 2015-07-11 MED ORDER — ATORVASTATIN CALCIUM 10 MG PO TABS: 10.0000 mg | ORAL_TABLET | Freq: Every day | ORAL | Status: DC

## 2015-07-11 NOTE — Progress Notes (Signed)
NEUROLOGY CONSULTATION NOTE  ZARIYA MINNER MRN: [id number] DOB: 02-08-1966  Referring provider: Dr. Dayton Martes Primary care provider: Dr. Dayton Martes  Reason for consult:  TIA  HISTORY OF PRESENT ILLNESS: Debra Myers is a 50 year old right-handed woman with hypertension, type 2 diabetes, degenerative disc disease, anxiety and depression and status post lap-band surgery who presents for TIA.  History obtained by patient and hospital notes.  Labs, echo report and imaging of brain CT, MRI and MRA were personally reviewed.  She was admitted to [facility] from 06/11/15 to 06/12/15 for acute onset of numbness that started at the end of her tongue and then spread to the lower lip and left upper and lower weakness.  There was also associated word-finding difficulties.  Symptoms didn't last long but she still had numbness on the tongue and side of mouth in the hospital.  Due to NIHSS score of 0 at time of evaluation, she did not receive tPA.  CT of head on 06/11/15 was negative.  Repeat CT of head the following day was unchanged.  MRI and MRA of head were unremarkable.  2D echo showed EF 60-65% with moderate LVH but no cardiac source of emboli.  LDL was 87.  Hgb A1c was 5.9.  She was discharged on ASA  daily with a diagnosis of TIA.  She reports history of significant stress and anxiety, which was particularly more severe for the prior several days.  She also started Vyvance to help treat binge eating.  PAST MEDICAL HISTORY: Past Medical History  Diagnosis Date  . Depression   . Vaginal delivery     ONE NSVD  . Elective abortion     ONE  . Asthma     ALLERGY INDUCED  . Diabetes mellitus     TYPE 2  . Anxiety   . Hypertension   . Degenerative disc disease   . Degenerative lumbar disc   . GERD (gastroesophageal reflux disease)     takes prilosec     PAST SURGICAL HISTORY: Past Surgical History  Procedure Laterality Date  . Cholecystectomy  2001  . Lapband  04/2012  . Wisdom tooth extraction    .  Dilatation & curettage/hysteroscopy with myosure N/A 09/10/2014    Procedure: DILATATION & CURETTAGE/HYSTEROSCOPY WITH MYOSURE/INSERTION OF IUD;  Surgeon: Ok Edwards, MD;  Location: [facility];  Service: Gynecology;  Laterality: N/A;    MEDICATIONS: Current Outpatient Prescriptions on File Prior to Visit  Medication Sig Dispense Refill  . albuterol (PROVENTIL HFA;VENTOLIN HFA) 108 (90 BASE) MCG/ACT inhaler Inhale 2 puffs into the lungs every 6 (six) hours as needed for wheezing or shortness of breath. 1 Inhaler 5  . aspirin EC 81 MG tablet Take 1 tablet (81 mg total) by mouth daily. 30 tablet 2  . buPROPion (WELLBUTRIN XL) 300 MG 24 hr tablet Take 300 mg by mouth daily.    . cetirizine (ZYRTEC) 10 MG tablet Take 10 mg by mouth at bedtime.    . fluticasone (FLONASE) 50 MCG/ACT nasal spray Place 2 sprays into both nostrils daily as needed for rhinitis.    Marland Kitchen HYDROcodone-acetaminophen (NORCO/VICODIN) 5-325 MG tablet Take 0.5-1 tablets by mouth 2 (two) times daily as needed for moderate pain. Reported on 06/27/2015    . ibuprofen (ADVIL,MOTRIN) 200 MG tablet Take 800 mg by mouth every 4 (four) hours as needed for headache or mild pain.     Marland Kitchen lamoTRIgine (LAMICTAL) 200 MG tablet Take 300 mg by mouth at bedtime.     Marland Kitchen  LORazepam (ATIVAN) 1 MG tablet Take 0.5-1 mg by mouth every 8 (eight) hours as needed for anxiety or sleep.     . metaxalone (SKELAXIN) 800 MG tablet Take 800 mg by mouth 3 (three) times daily as needed for muscle spasms.    . metFORMIN (GLUCOPHAGE) 500 MG tablet Take 500 mg by mouth daily with breakfast.    . montelukast (SINGULAIR) 10 MG tablet Take 1 tablet (10 mg total) by mouth at bedtime. 30 tablet 7  . omeprazole (PRILOSEC) 20 MG capsule Take 1 capsule (20 mg total) by mouth daily. 30 capsule 11  . PARoxetine (PAXIL-CR) 25 MG 24 hr tablet Take 50 mg by mouth at bedtime.     Marland Kitchen spironolactone (ALDACTONE) 25 MG tablet Take 25 mg by mouth daily. Reported on 06/27/2015    .  lisdexamfetamine (VYVANSE) 30 MG capsule Take 30 mg by mouth daily. Reported on 07/11/2015     No current facility-administered medications on file prior to visit.    ALLERGIES: Allergies  Allergen Reactions  . Oxycodone Itching  . Penicillins Other (See Comments)    Reaction:  GI upset  Has patient had a PCN reaction causing immediate rash, facial/tongue/throat swelling, SOB or lightheadedness with hypotension: No Has patient had a PCN reaction causing severe rash involving mucus membranes or skin necrosis: No Has patient had a PCN reaction that required hospitalization No Has patient had a PCN reaction occurring within the last 10 years: No If all of the above answers are "NO", then may proceed with Cephalosporin use.  . Sulfa Antibiotics Itching and Rash    FAMILY HISTORY: Family History  Problem Relation Age of Onset  . Hypertension Father   . Diabetes Father   . Asthma Maternal Grandmother   . Stroke Maternal Grandmother   . Hypertension Mother   . Stroke Maternal Grandfather     SOCIAL HISTORY: Social History   Social History  . Marital Status: Married    Spouse Name: N/A  . Number of Children: N/A  . Years of Education: N/A   Occupational History  . Not on file.   Social History Main Topics  . Smoking status: Never Smoker   . Smokeless tobacco: Never Used  . Alcohol Use: No     Comment: rarely  . Drug Use: No  . Sexual Activity: Not Currently    Birth Control/ Protection: IUD   Other Topics Concern  . Not on file   Social History Narrative    REVIEW OF SYSTEMS: Constitutional: No fevers, chills, or sweats, no generalized fatigue, change in appetite Eyes: No visual changes, double vision, eye pain Ear, nose and throat: No hearing loss, ear pain, nasal congestion, sore throat Cardiovascular: No chest pain, palpitations Respiratory:  No shortness of breath at rest or with exertion, wheezes GastrointestinaI: No nausea, vomiting, diarrhea, abdominal  pain, fecal incontinence Genitourinary:  No dysuria, urinary retention or frequency Musculoskeletal:  No neck pain, back pain Integumentary: No rash, pruritus, skin lesions Neurological: as above Psychiatric: No depression, insomnia, anxiety Endocrine: No palpitations, fatigue, diaphoresis, mood swings, change in appetite, change in weight, increased thirst Hematologic/Lymphatic:  No purpura, petechiae. Allergic/Immunologic: no itchy/runny eyes, nasal congestion, recent allergic reactions, rashes  PHYSICAL EXAM: Filed Vitals:   07/11/15 0821  BP: 140/78  Pulse: 102   General: No acute distress.  Patient appears well-groomed.  Morbidly obese. Head:  Normocephalic/atraumatic Eyes:  fundi examined but not visualized Neck: supple, no paraspinal tenderness, full range of motion Back: No paraspinal tenderness  Heart: regular rate and rhythm Lungs: Clear to auscultation bilaterally. Vascular: No carotid bruits. Neurological Exam: Mental status: alert and oriented to person, place, and time, recent and remote memory intact, fund of knowledge intact, attention and concentration intact, speech fluent and not dysarthric, language intact. Cranial nerves: CN I: not tested CN II: pupils equal, round and reactive to light, visual fields intact CN III, IV, VI:  full range of motion, no nystagmus, no ptosis CN V: facial sensation intact CN VII: upper and lower face symmetric CN VIII: hearing intact CN IX, X: gag intact, uvula midline CN XI: sternocleidomastoid and trapezius muscles intact CN XII: tongue midline Bulk & Tone: normal, no fasciculations. Motor:  5/5 throughout  Sensation:  Pinprick and vibration sensation intact. Deep Tendon Reflexes:  2+ throughout, toes downgoing. Finger to nose testing:  Without dysmetria.  Heel to shin:  Without dysmetria.  Gait:  Normal station and stride.  Able to turn and tandem walk. Romberg negative.  IMPRESSION: Transient ischemic attack.  Morbid  obesity Type 2 diabetes Hypertension  PLAN: 1.  Continue ASA 81mg  daily 2.  Start Lipitor 10mg  daily.  LDL goal should be less than 70.  Will recheck in 3 months. 3.  Mediterranean diet and weight loss 4.  Exercise 5.  Maintain blood pressure control 6.  Follow up in 3 months. 7.  Given that Vyvanse rarely may cause stroke, I advise not to take it. 8.  Check carotid doppler  Thank you for allowing me to take part in the care of this patient.  Shon MilletAdam Haydon Dorris, DO  CC:  Ruthe Mannanalia Aron, MD

## 2015-07-11 NOTE — Progress Notes (Signed)
   HPI: Patient is a 50 year old who was seen the office for annual exam on 06/27/2015. It had come to my attention that in December 2015 1 reviewed her record that she had an ultrasound as a result of right lower quadrant abdominal discomfort. An ultrasound at that time demonstrated the following:  T/V anteverted uterus with IUD seen in normal position. Right ovary rim of tissue seen with thin-walled echo-free avascular cyst 43 x 48 x 37 mean 43 mm. Avascular. Left ovary normal. Negative cul-de-sac. Endometrium 3.2 mm. also in July 2016 patient underwent an outpatient resectoscopic polypectomy as a result of dysfunction uterine bleeding and endometrial polyp and had a Mirena IUD placed per she has done well as to her cycles. Patient had received Depo-Provera 150 mg IM in hopes of resolution of the cyst which appeared to be benign but she did not return to the office for that follow-up ultrasound until today.  Ultrasound today: Uterus measured 8.1 x 4.7 x 3.8 cm with endometrial stripe of 3.6 mm. IUD was seen in the normal position. Right ovarian cyst not seen complete resolution. A solid calcified area measured 13 x 12 x 12 mm was noted echogenic, negative color flow. Arterial blood flow was seen to that right ovary. Left ovary thinwall cyst with layering of debris negative color flow. Mass measuring 3.5 x 2.6 x 3.7 cm average size 3.3 mm no fluid in the cul-de-sac.   ROS: A ROS was performed and pertinent positives and negatives are included in the history.  GENERAL: No fevers or chills. HEENT: No change in vision, no earache, sore throat or sinus congestion. NECK: No pain or stiffness. CARDIOVASCULAR: No chest pain or pressure. No palpitations. PULMONARY: No shortness of breath, cough or wheeze. GASTROINTESTINAL: No abdominal pain, nausea, vomiting or diarrhea, melena or bright red blood per rectum. GENITOURINARY: No urinary frequency, urgency, hesitancy or dysuria. MUSCULOSKELETAL: No joint or muscle  pain, no back pain, no recent trauma. DERMATOLOGIC: No rash, no itching, no lesions. ENDOCRINE: No polyuria, polydipsia, no heat or cold intolerance. No recent change in weight. HEMATOLOGICAL: No anemia or easy bruising or bleeding. NEUROLOGIC: No headache, seizures, numbness, tingling or weakness. PSYCHIATRIC: No depression, no loss of interest in normal activity or change in sleep pattern.    Complete physical exam was done last month see previous note   Assessment Plan: 50 year old overweight female with Mirena IUD doing well. History of right ovarian cysts in 2016 completely resolved now has 3.3 cm average size left ovarian cyst in the calcified or solid area on the right ovary. Patient refuses to receive her second shot of the Depo-Provera because she did not feel well with it. She would like to wait and repeat the ultrasound in 3-4 months. We'll check a CA 125 ovarian cancer screening. Its limitations were discussed with the patient. Patient otherwise was asymptomatic.    Greater than 50% of time was spent in counseling and coordinating care of this patient.   Time of consultation: 15   Minutes.

## 2015-07-11 NOTE — Patient Instructions (Signed)
1.  Start Lipitor (atorvastatin)  daily to reduce cholesterol 2.  Continue aspirin  daily 3.  Diet and exercise important.  Recommend Mediterranean diet    Why follow it? Research shows. . Those who follow the Mediterranean diet have a reduced risk of heart disease  . The diet is associated with a reduced incidence of Parkinson's and Alzheimer's diseases . People following the diet may have longer life expectancies and lower rates of chronic diseases  . The Dietary Guidelines for Americans recommends the Mediterranean diet as an eating plan to promote health and prevent disease  What Is the Mediterranean Diet?  . Healthy eating plan based on typical foods and recipes of Mediterranean-style cooking . The diet is primarily a plant based diet; these foods should make up a majority of meals   Starches - Plant based foods should make up a majority of meals - They are an important sources of vitamins, minerals, energy, antioxidants, and fiber - Choose whole grains, foods high in fiber and minimally processed items  - Typical grain sources include wheat, oats, barley, corn, brown rice, bulgar, farro, millet, polenta, couscous  - Various types of beans include chickpeas, lentils, fava beans, black beans, white beans   Fruits  Veggies - Large quantities of antioxidant rich fruits & veggies; 6 or more servings  - Vegetables can be eaten raw or lightly drizzled with oil and cooked  - Vegetables common to the traditional Mediterranean Diet include: artichokes, arugula, beets, broccoli, brussel sprouts, cabbage, carrots, celery, collard greens, cucumbers, eggplant, kale, leeks, lemons, lettuce, mushrooms, okra, onions, peas, peppers, potatoes, pumpkin, radishes, rutabaga, shallots, spinach, sweet potatoes, turnips, zucchini - Fruits common to the Mediterranean Diet include: apples, apricots, avocados, cherries, clementines, dates, figs, grapefruits, grapes, melons, nectarines, oranges, peaches,  pears, pomegranates, strawberries, tangerines  Fats - Replace butter and margarine with healthy oils, such as olive oil, canola oil, and tahini  - Limit nuts to no more than a handful a day  - Nuts include walnuts, almonds, pecans, pistachios, pine nuts  - Limit or avoid candied, honey roasted or heavily salted nuts - Olives are central to the [employer] - can be eaten whole or used in a variety of dishes   Meats Protein - Limiting red meat: no more than a few times a month - When eating red meat: choose lean cuts and keep the portion to the size of deck of cards - Eggs: approx. 0 to 4 times a week  - Fish and lean poultry: at least 2 a week  - Healthy protein sources include, chicken, [place], lean beef, lamb - Increase intake of seafood such as tuna, salmon, trout, mackerel, shrimp, scallops - Avoid or limit high fat processed meats such as sausage and bacon  Dairy - Include moderate amounts of low fat dairy products  - Focus on healthy dairy such as fat free yogurt, skim milk, low or reduced fat cheese - Limit dairy products higher in fat such as whole or 2% milk, cheese, ice cream  Alcohol - Moderate amounts of red wine is ok  - No more than 5 oz daily for women (all ages) and men older than age 61  - No more than 10 oz of wine daily for men younger than 43  Other - Limit sweets and other desserts  - Use herbs and spices instead of salt to flavor foods  - Herbs and spices common to the traditional Mediterranean Diet include: basil, bay leaves, chives, cloves, cumin, fennel,  garlic, lavender, marjoram, mint, oregano, parsley, pepper, rosemary, sage, savory, sumac, tarragon, thyme   It's not just a diet, it's a lifestyle:  . The Mediterranean diet includes lifestyle factors typical of those in the region  . Foods, drinks and meals are best eaten with others and savored . Daily physical activity is important for overall good health . This could be strenuous exercise like running  and aerobics . This could also be more leisurely activities such as walking, housework, yard-work, or taking the stairs . Moderation is the key; a balanced and healthy diet accommodates most foods and drinks . Consider portion sizes and frequency of consumption of certain foods   Meal Ideas & Options:  . Breakfast:  o Whole wheat toast or whole wheat English muffins with peanut butter & hard boiled egg o Steel cut oats topped with apples & cinnamon and skim milk  o Fresh fruit: banana, strawberries, melon, berries, peaches  o Smoothies: strawberries, bananas, greek yogurt, peanut butter o Low fat greek yogurt with blueberries and granola  o Egg white omelet with spinach and mushrooms o Breakfast couscous: whole wheat couscous, apricots, skim milk, cranberries  . Sandwiches:  o Hummus and grilled vegetables (peppers, zucchini, squash) on whole wheat bread   o Grilled chicken on whole wheat pita with lettuce, tomatoes, cucumbers or tzatziki  o Tuna salad on whole wheat bread: tuna salad made with greek yogurt, olives, red peppers, capers, green onions o Garlic rosemary lamb pita: lamb sauted with garlic, rosemary, salt & pepper; add lettuce, cucumber, greek yogurt to pita - flavor with lemon juice and black pepper  . Seafood:  o Mediterranean grilled salmon, seasoned with garlic, basil, parsley, lemon juice and black pepper o Shrimp, lemon, and spinach whole-grain pasta salad made with low fat greek yogurt  o Seared scallops with lemon orzo  o Seared tuna steaks seasoned salt, pepper, coriander topped with tomato mixture of olives, tomatoes, olive oil, minced garlic, parsley, green onions and cappers  . Meats:  o Herbed greek chicken salad with kalamata olives, cucumber, feta  o Red bell peppers stuffed with spinach, bulgur, lean ground beef (or lentils) & topped with feta   o Kebabs: skewers of chicken, tomatoes, onions, zucchini, squash  o [place] burgers: made with red onions, mint,  dill, lemon juice, feta cheese topped with roasted red peppers . Vegetarian o Cucumber salad: cucumbers, artichoke hearts, celery, red onion, feta cheese, tossed in olive oil & lemon juice  o Hummus and whole grain pita points with a greek salad (lettuce, tomato, feta, olives, cucumbers, red onion) o Lentil soup with celery, carrots made with vegetable broth, garlic, salt and pepper  o Tabouli salad: parsley, bulgur, mint, scallions, cucumbers, tomato, radishes, lemon juice, olive oil, salt and pepper. 4.  Check carotid doppler 5.  Recheck fasting lipid panel in 3 months.  Follow up soon afterwards.

## 2015-07-11 NOTE — Patient Instructions (Signed)
CA-125 Tumor Marker Test WHY AM I HAVING THIS TEST? This test is used to check the level of cancer antigen 125 (CA-125) in your blood. The CA-125 tumor marker test can be helpful in detecting ovarian cancer. The test is only performed if you are considered at high risk for ovarian cancer. Your health care provider may recommend this test if:  You have a strong family history of ovarian cancer.  You have a breast cancer antigen (BRCA) genetic defect. If you have already been diagnosed with ovarian cancer, your health care provider may use this test to help identify the extent of the disease and to monitor your response to treatment. WHAT KIND OF SAMPLE IS TAKEN? A blood sample is required for this test. It is usually collected by inserting a needle into a vein. HOW DO I PREPARE FOR THE TEST? There is no preparation required for this test. WHAT ARE THE REFERENCE RANGES? Reference ranges are considered healthy ranges established after testing a large group of healthy people. Reference ranges may vary among different people, labs, and hospitals. It is your responsibility to obtain your test results. Ask the lab or department performing the test when and how you will get your results. The reference range for this test is 0-35 units/mL or less than 35 kunits/L (SI units). WHAT DO THE RESULTS MEAN? Increased levels of CA-125 may indicate:  Certain types of cancer, including:  Ovarian cancer.  Pancreatic cancer.  Colon cancer.  Lung cancer.  Breast cancer.  Lymphoma.  Noncancerous (benign) disorders, including:  Cirrhosis.  Pregnancy.  Endometriosis.  Pancreatitis.  Pelvic inflammatory disease (PID). Talk with your health care provider to discuss your results, treatment options, and if necessary, the need for more tests. Talk with your health care provider if you have any questions about your results.   This information is not intended to replace advice given to you by your  health care provider. Make sure you discuss any questions you have with your health care provider.   Document Released: 03/02/2004 Document Revised: 03/01/2014 Document Reviewed: 06/28/2013 Elsevier Interactive Patient Education 2016 [employer] Ovarian Cyst An ovarian cyst is a fluid-filled sac that forms on an ovary. The ovaries are small organs that produce eggs in women. Various types of cysts can form on the ovaries. Most are not cancerous. Many do not cause problems, and they often go away on their own. Some may cause symptoms and require treatment. Common types of ovarian cysts include:  Functional cysts--These cysts may occur every month during the menstrual cycle. This is normal. The cysts usually go away with the next menstrual cycle if the woman does not get pregnant. Usually, there are no symptoms with a functional cyst.  Endometrioma cysts--These cysts form from the tissue that lines the uterus. They are also called "chocolate cysts" because they become filled with blood that turns brown. This type of cyst can cause pain in the lower abdomen during intercourse and with your menstrual period.  Cystadenoma cysts--This type develops from the cells on the outside of the ovary. These cysts can get very big and cause lower abdomen pain and pain with intercourse. This type of cyst can twist on itself, cut off its blood supply, and cause severe pain. It can also easily rupture and cause a lot of pain.  Dermoid cysts--This type of cyst is sometimes found in both ovaries. These cysts may contain different kinds of body tissue, such as skin, teeth, hair, or cartilage. They usually do not cause  symptoms unless they get very big.  Theca lutein cysts--These cysts occur when too much of a certain hormone (human chorionic gonadotropin) is produced and overstimulates the ovaries to produce an egg. This is most common after procedures used to assist with the conception of a baby (in vitro  fertilization). CAUSES   Fertility drugs can cause a condition in which multiple large cysts are formed on the ovaries. This is called ovarian hyperstimulation syndrome.  A condition called polycystic ovary syndrome can cause hormonal imbalances that can lead to nonfunctional ovarian cysts. SIGNS AND SYMPTOMS  Many ovarian cysts do not cause symptoms. If symptoms are present, they may include:  Pelvic pain or pressure.  Pain in the lower abdomen.  Pain during sexual intercourse.  Increasing girth (swelling) of the abdomen.  Abnormal menstrual periods.  Increasing pain with menstrual periods.  Stopping having menstrual periods without being pregnant. DIAGNOSIS  These cysts are commonly found during a routine or annual pelvic exam. Tests may be ordered to find out more about the cyst. These tests may include:  Ultrasound.  X-ray of the pelvis.  CT scan.  MRI.  Blood tests. TREATMENT  Many ovarian cysts go away on their own without treatment. Your health care provider may want to check your cyst regularly for 2-3 months to see if it changes. For women in menopause, it is particularly important to monitor a cyst closely because of the higher rate of ovarian cancer in menopausal women. When treatment is needed, it may include any of the following:  A procedure to drain the cyst (aspiration). This may be done using a long needle and ultrasound. It can also be done through a laparoscopic procedure. This involves using a thin, lighted tube with a tiny camera on the end (laparoscope) inserted through a small incision.  Surgery to remove the whole cyst. This may be done using laparoscopic surgery or an open surgery involving a larger incision in the lower abdomen.  Hormone treatment or birth control pills. These methods are sometimes used to help dissolve a cyst. HOME CARE INSTRUCTIONS   Only take over-the-counter or prescription medicines as directed by your health care  provider.  Follow up with your health care provider as directed.  Get regular pelvic exams and Pap tests. SEEK MEDICAL CARE IF:   Your periods are late, irregular, or painful, or they stop.  Your pelvic pain or abdominal pain does not go away.  Your abdomen becomes larger or swollen.  You have pressure on your bladder or trouble emptying your bladder completely.  You have pain during sexual intercourse.  You have feelings of fullness, pressure, or discomfort in your stomach.  You lose weight for no apparent reason.  You feel generally ill.  You become constipated.  You lose your appetite.  You develop acne.  You have an increase in body and facial hair.  You are gaining weight, without changing your exercise and eating habits.  You think you are pregnant. SEEK IMMEDIATE MEDICAL CARE IF:   You have increasing abdominal pain.  You feel sick to your stomach (nauseous), and you throw up (vomit).  You develop a fever that comes on suddenly.  You have abdominal pain during a bowel movement.  Your menstrual periods become heavier than usual. MAKE SURE YOU:  Understand these instructions.  Will watch your condition.  Will get help right away if you are not doing well or get worse.   This information is not intended to replace advice given to  you by your health care provider. Make sure you discuss any questions you have with your health care provider.   Document Released: 02/08/2005 Document Revised: 02/13/2013 Document Reviewed: 10/16/2012 Elsevier Interactive Patient Education [employer].

## 2015-07-12 LAB — CA 125: CA 125: 14 U/mL (ref <35)

## 2015-07-18 ENCOUNTER — Other Ambulatory Visit: Payer: Self-pay | Admitting: Family Medicine

## 2015-07-18 ENCOUNTER — Telehealth: Payer: Self-pay | Admitting: *Deleted

## 2015-07-18 ENCOUNTER — Ambulatory Visit
Admission: RE | Admit: 2015-07-18 | Discharge: 2015-07-18 | Disposition: A | Discharge status: Home / Self Care | Payer: Managed Care, Other (non HMO) | Source: Ambulatory Visit | Attending: Neurology | Admitting: Neurology

## 2015-07-18 DIAGNOSIS — G459 Transient cerebral ischemic attack, unspecified: Secondary | ICD-10-CM | POA: Diagnosis present

## 2015-07-18 NOTE — Telephone Encounter (Signed)
lvm made the pt aware that her appt for 08/21/15 @ 2pm has been changed. Gave appt date and time for 08/21/15@ 10:20am..Marland Kitchen.TD

## 2015-07-19 NOTE — Telephone Encounter (Signed)
Last filled 06/13/2015--please advise if okay for pt to continue

## 2015-07-23 ENCOUNTER — Telehealth: Payer: Self-pay

## 2015-07-23 ENCOUNTER — Telehealth: Payer: Self-pay | Admitting: *Deleted

## 2015-07-23 NOTE — Telephone Encounter (Signed)
-----   Message from [place] Chardonika K Patel, DO sent at 07/18/2015  5:45 PM EDT ----- Please inform patient that her carotid ultrasound looks normal. Thanks

## 2015-07-23 NOTE — Telephone Encounter (Signed)
Patient asking about getting refills on Skelaxin. Dr. Laban EmperorNaveira asked, said no refills outside of appt. Patient notified, requested sooner appointment. 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

## 2015-07-23 NOTE — Telephone Encounter (Signed)
Results were left on pt's voicemail, with instructions to call back with any questions or concerns in relation to results.   

## 2015-07-23 NOTE — Telephone Encounter (Signed)
Pt lvm asking for a nurse to give her a call...thanks

## 2015-08-05 ENCOUNTER — Ambulatory Visit: Payer: Managed Care, Other (non HMO) | Attending: Pain Medicine | Admitting: Pain Medicine

## 2015-08-05 ENCOUNTER — Encounter: Payer: Self-pay | Admitting: Pain Medicine

## 2015-08-05 VITALS — BP 148/75 | HR 90 | Temp 98.3°F | Resp 14 | Ht 64.5 in | Wt 252.0 lb

## 2015-08-05 DIAGNOSIS — M79605 Pain in left leg: Secondary | ICD-10-CM | POA: Diagnosis not present

## 2015-08-05 DIAGNOSIS — L680 Hirsutism: Secondary | ICD-10-CM | POA: Insufficient documentation

## 2015-08-05 DIAGNOSIS — M5030 Other cervical disc degeneration, unspecified cervical region: Secondary | ICD-10-CM | POA: Insufficient documentation

## 2015-08-05 DIAGNOSIS — M4802 Spinal stenosis, cervical region: Secondary | ICD-10-CM | POA: Insufficient documentation

## 2015-08-05 DIAGNOSIS — M5412 Radiculopathy, cervical region: Secondary | ICD-10-CM

## 2015-08-05 DIAGNOSIS — M5382 Other specified dorsopathies, cervical region: Secondary | ICD-10-CM

## 2015-08-05 DIAGNOSIS — M4806 Spinal stenosis, lumbar region: Secondary | ICD-10-CM | POA: Insufficient documentation

## 2015-08-05 DIAGNOSIS — E119 Type 2 diabetes mellitus without complications: Secondary | ICD-10-CM | POA: Diagnosis not present

## 2015-08-05 DIAGNOSIS — F419 Anxiety disorder, unspecified: Secondary | ICD-10-CM | POA: Insufficient documentation

## 2015-08-05 DIAGNOSIS — M47812 Spondylosis without myelopathy or radiculopathy, cervical region: Secondary | ICD-10-CM

## 2015-08-05 DIAGNOSIS — M48061 Spinal stenosis, lumbar region without neurogenic claudication: Secondary | ICD-10-CM

## 2015-08-05 DIAGNOSIS — E8881 Metabolic syndrome: Secondary | ICD-10-CM | POA: Diagnosis not present

## 2015-08-05 DIAGNOSIS — I6529 Occlusion and stenosis of unspecified carotid artery: Secondary | ICD-10-CM | POA: Insufficient documentation

## 2015-08-05 DIAGNOSIS — G459 Transient cerebral ischemic attack, unspecified: Secondary | ICD-10-CM | POA: Diagnosis not present

## 2015-08-05 DIAGNOSIS — M545 Low back pain: Secondary | ICD-10-CM | POA: Insufficient documentation

## 2015-08-05 DIAGNOSIS — E785 Hyperlipidemia, unspecified: Secondary | ICD-10-CM | POA: Insufficient documentation

## 2015-08-05 DIAGNOSIS — E781 Pure hyperglyceridemia: Secondary | ICD-10-CM | POA: Insufficient documentation

## 2015-08-05 DIAGNOSIS — M5416 Radiculopathy, lumbar region: Secondary | ICD-10-CM | POA: Insufficient documentation

## 2015-08-05 DIAGNOSIS — M62838 Other muscle spasm: Secondary | ICD-10-CM | POA: Insufficient documentation

## 2015-08-05 DIAGNOSIS — E042 Nontoxic multinodular goiter: Secondary | ICD-10-CM | POA: Insufficient documentation

## 2015-08-05 DIAGNOSIS — Z79891 Long term (current) use of opiate analgesic: Secondary | ICD-10-CM | POA: Diagnosis not present

## 2015-08-05 DIAGNOSIS — M7918 Myalgia, other site: Secondary | ICD-10-CM | POA: Insufficient documentation

## 2015-08-05 DIAGNOSIS — G5603 Carpal tunnel syndrome, bilateral upper limbs: Secondary | ICD-10-CM | POA: Insufficient documentation

## 2015-08-05 DIAGNOSIS — M542 Cervicalgia: Secondary | ICD-10-CM | POA: Diagnosis not present

## 2015-08-05 DIAGNOSIS — J309 Allergic rhinitis, unspecified: Secondary | ICD-10-CM | POA: Insufficient documentation

## 2015-08-05 DIAGNOSIS — F1190 Opioid use, unspecified, uncomplicated: Secondary | ICD-10-CM

## 2015-08-05 DIAGNOSIS — Z7984 Long term (current) use of oral hypoglycemic drugs: Secondary | ICD-10-CM | POA: Insufficient documentation

## 2015-08-05 DIAGNOSIS — Z6841 Body Mass Index (BMI) 40.0 and over, adult: Secondary | ICD-10-CM | POA: Insufficient documentation

## 2015-08-05 DIAGNOSIS — Z7982 Long term (current) use of aspirin: Secondary | ICD-10-CM | POA: Insufficient documentation

## 2015-08-05 DIAGNOSIS — E282 Polycystic ovarian syndrome: Secondary | ICD-10-CM | POA: Insufficient documentation

## 2015-08-05 DIAGNOSIS — I10 Essential (primary) hypertension: Secondary | ICD-10-CM | POA: Diagnosis not present

## 2015-08-05 DIAGNOSIS — Z975 Presence of (intrauterine) contraceptive device: Secondary | ICD-10-CM | POA: Insufficient documentation

## 2015-08-05 DIAGNOSIS — M4310 Spondylolisthesis, site unspecified: Secondary | ICD-10-CM

## 2015-08-05 DIAGNOSIS — M47816 Spondylosis without myelopathy or radiculopathy, lumbar region: Secondary | ICD-10-CM

## 2015-08-05 DIAGNOSIS — G8929 Other chronic pain: Secondary | ICD-10-CM | POA: Insufficient documentation

## 2015-08-05 DIAGNOSIS — M791 Myalgia: Secondary | ICD-10-CM | POA: Diagnosis not present

## 2015-08-05 DIAGNOSIS — M4312 Spondylolisthesis, cervical region: Secondary | ICD-10-CM | POA: Insufficient documentation

## 2015-08-05 DIAGNOSIS — J45909 Unspecified asthma, uncomplicated: Secondary | ICD-10-CM | POA: Diagnosis not present

## 2015-08-05 DIAGNOSIS — K219 Gastro-esophageal reflux disease without esophagitis: Secondary | ICD-10-CM | POA: Diagnosis not present

## 2015-08-05 DIAGNOSIS — M549 Dorsalgia, unspecified: Secondary | ICD-10-CM | POA: Diagnosis present

## 2015-08-05 DIAGNOSIS — M6249 Contracture of muscle, multiple sites: Secondary | ICD-10-CM

## 2015-08-05 DIAGNOSIS — Q762 Congenital spondylolisthesis: Secondary | ICD-10-CM

## 2015-08-05 DIAGNOSIS — E669 Obesity, unspecified: Secondary | ICD-10-CM | POA: Insufficient documentation

## 2015-08-05 MED ORDER — HYDROCODONE-ACETAMINOPHEN 5-325 MG PO TABS: 0.5000 | ORAL_TABLET | Freq: Two times a day (BID) | ORAL | Status: DC | PRN

## 2015-08-05 MED ORDER — METAXALONE 800 MG PO TABS: 800.0000 mg | ORAL_TABLET | Freq: Three times a day (TID) | ORAL | Status: DC | PRN

## 2015-08-05 NOTE — Progress Notes (Signed)
Patient's Name: Debra Myers  Patient type: Established  MRN: [id number]  Service setting: Ambulatory outpatient  DOB: 1965-06-22  Location: [facility]  DOS: 08/05/2015  Primary Care Physician: Arnette Norris, MD  Note by: Kathlen Brunswick. Dossie Arbour, M.D, DABA, DABAPM, DABPM, DABIPP, FIPP  Referring Physician: Lucille Passy, MD  Specialty: Board-Certified Interventional Pain Management  Last Visit to Pain Management: 07/23/2015   Primary Reason(s) for Visit: Encounter for prescription drug management (Level of risk: moderate) CC: Back Pain   HPI  Debra Myers is a 50 y.o. year old, female patient, who returns today as an established patient. She has GOITER, NONTOXIC MULTINODULAR; Diabetes ([place]); POLYCYSTIC OVARIES; HYPERTRIGLYCERIDEMIA; Metabolic Syndrome X; OBESITY; Anxiety; Allergic rhinitis; Asthma; HIRSUTISM; Generalized anxiety disorder; Hypertension; Overweight(278.02); Endometrial polyp; GERD (gastroesophageal reflux disease); IUD (intrauterine device) in place; Chronic pain syndrome; Opiate use (10 MME/Day); Long term prescription opiate use; Chronic pain; Long term current use of opiate analgesic; Encounter for therapeutic drug level monitoring; Encounter for chronic pain management; Numbness of upper extremity; Radiculitis involving upper extremity; Chronic low back pain (Location of Primary Source of Pain) (Bilateral) (L>R); Lumbar spondylosis; Chronic lower extremity pain (Location of Secondary source of pain) (Left); Chronic radicular lumbar pain (Left); 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 Her primarily concern today is the Back Pain   Pain Assessment: Self-Reported Pain Score: 1  Reported level is compatible with observation Pain Type: Chronic pain Pain Location: Back Pain Orientation: Left, Lower Pain Descriptors / Indicators: Aching Pain Frequency: Constant  The patient comes into the clinics today for pharmacological management of her chronic pain. I last saw this patient on 05/26/2015. The patient  reports that she does not use illicit drugs. Her body mass index is 42.6 kg/(m^2).   She is currently a little stressed out because she was admitted to the hospital due to a TIA. She is now willing to do what it takes to get herself better, decrease the stress of job, treat her high cholesterol, and perhaps lose some weight.  Date of Last Visit: 05/26/15 Service Provided on Last Visit: Med Refill  Controlled Substance Pharmacotherapy Assessment & REMS (Risk Evaluation and Mitigation Strategy)  Analgesic: Hydrocodone/APAP 5/325 2 tablets per day (10 mg/day) Pill Count: Hydrocodone 25.5/60; filled 07/14/15. Has two scripts that was not filled. Would like to have script for Skelaxin also. MME/day: 10 mg/day Pharmacokinetics: Onset of action (Liberation/Absorption): Within expected pharmacological parameters Time to Peak effect (Distribution): Timing and results are as within normal expected parameters Duration of action (Metabolism/Excretion): Within normal limits for medication Pharmacodynamics: Analgesic Effect: More than 50% Activity Facilitation: Medication(s) allow patient to sit, stand, walk, and do the basic ADLs Perceived Effectiveness: Described as relatively effective, allowing for increase in activities of daily living (ADL) Side-effects or Adverse reactions: None reported Monitoring: [place] PMP:  Online review of the past 64-monthperiod conducted. Compliant with practice rules and regulations Last UDS on record: TOXASSURE SELECT 13  Date Value Ref Range Status  05/26/2015 FINAL  Final    Comment:    ==================================================================== TOXASSURE SSELECT 68(MW) ====================================================================  Test                             Result       Flag       Units Drug Present and Declared for Prescription Verification   Amphetamine                    688          EXPECTED   ng/mg creat    Amphetamine is available as a schedule II prescription drug.   Lorazepam                      1506         EXPECTED   ng/mg creat    Source of lorazepam is a scheduled prescription medication.   Hydrocodone                    124          EXPECTED   ng/mg creat   Norhydrocodone                 283          EXPECTED   ng/mg creat    Sources of hydrocodone include scheduled prescription    medications. Norhydrocodone is an expected metabolite of    hydrocodone. ==================================================================== Test                      Result    Flag   Units      Ref Range   Creatinine              124              mg/dL      >=20 ==================================================================== Declared Medications:  The flagging and interpretation on this report are based on the  following declared medications.  Unexpected results may arise from  inaccuracies in the declared medications.  **Note: The testing scope of this panel includes these medications:  Amphetamine (Vyvanse)  Hydrocodone (Hydrocodone-Acetaminophen)  Lorazepam (Ativan)  **Note: The testing scope of this panel does not include following  reported medications:  Acetaminophen (Hydrocodone-Acetaminophen)  Albuterol  Bupropion  Cetirizine  Fluticasone (Flonase)  Ibuprofen  Lamotrigine (Lamictal)  Metaxalone (Skelaxin)  Metformin   Montelukast (Singulair)  Omeprazole  Paroxetine  Spironolactone (Aldactone) ==================================================================== For clinical consultation, please call [phone]. ====================================================================    UDS interpretation: Compliant Medication Assessment Form: Reviewed. Patient indicates being compliant with therapy Treatment compliance: Compliant Risk Assessment: Aberrant Behavior: None observed today Substance Use Disorder (SUD) Risk Level: Low Risk of opioid abuse or dependence: 0.7-3.0% with doses ? 36 MME/day and 6.1-26% with doses ? 120 MME/day. Opioid Risk Tool (ORT) Score: Total Score: 1 Low Risk for SUD (Score <3) Depression Scale Score: PHQ-2: PHQ-2 Total Score: 0 No depression (0) PHQ-9: PHQ-9 Total Score: 0 No depression (0-4)  Pharmacologic Plan: No change in therapy, at this time  Laboratory Chemistry  Inflammation Markers No results found for: ESRSEDRATE, CRP  Renal Function Lab Results  Component Value Date   BUN 12 06/11/2015   CREATININE 0.80 06/12/2015   GFRAA >60 06/12/2015   GFRNONAA >60 06/12/2015    Hepatic Function Lab Results  Component Value Date   AST 17 06/11/2015   ALT 25 06/11/2015   ALBUMIN 4.2 06/11/2015    Electrolytes Lab Results  Component Value Date  NA 138 06/11/2015   K 4.0 06/11/2015   CL 103 06/11/2015   CALCIUM 9.0 06/11/2015    Pain Modulating Vitamins Lab Results  Component Value Date   VD25OH 44 05/18/2013    Coagulation Parameters Lab Results  Component Value Date   INR 1.10 06/11/2015   LABPROT 14.4 06/11/2015   APTT 33 06/11/2015   PLT 245 06/12/2015    Note: Labs Reviewed.  Recent Diagnostic Imaging  US Carotid Bilateral  07/18/2015  CLINICAL DATA:  TIA. History of hypertension, hyperlipidemia and diabetes. EXAM: BILATERAL CAROTID DUPLEX ULTRASOUND TECHNIQUE: Pearline Cables scale imaging, color Doppler and duplex ultrasound were performed  of bilateral carotid and vertebral arteries in the neck. COMPARISON:  Brain MRI - 06/12/2015 FINDINGS: Criteria: Quantification of carotid stenosis is based on velocity parameters that correlate the residual internal carotid diameter with NASCET-based stenosis levels, using the diameter of the distal internal carotid lumen as the denominator for stenosis measurement. The following velocity measurements were obtained: RIGHT ICA:  86/33 cm/sec CCA:  71/69 cm/sec SYSTOLIC ICA/CCA RATIO:  1.0 DIASTOLIC ICA/CCA RATIO:  2.5 ECA:  101 cm/sec LEFT ICA:  73/31 cm/sec CCA:  678/93 cm/sec SYSTOLIC ICA/CCA RATIO:  0.7 DIASTOLIC ICA/CCA RATIO:  1.9 ECA:  104 cm/sec RIGHT CAROTID ARTERY: There is no grayscale evidence of significant intimal thickening or atherosclerotic plaque affecting the interrogated portions of the right carotid system. There are no elevated peak systolic velocities within the interrogated course the right internal carotid artery to suggest a hemodynamically significant stenosis. RIGHT VERTEBRAL ARTERY:  Antegrade Flow LEFT CAROTID ARTERY: There is no grayscale evidence of significant intimal thickening or atherosclerotic plaque affecting the interrogated portions of the left carotid system. There are no elevated peak systolic velocities within the interrogated course of the left internal carotid artery to suggest a hemodynamically significant stenosis. LEFT VERTEBRAL ARTERY:  Antegrade Flow IMPRESSION: Unremarkable carotid Doppler ultrasound. Electronically Signed   By: Sandi Mariscal M.D.   On: 07/18/2015 17:33   Cervical Imaging: Cervical DG complete:  Results for orders placed during the hospital encounter of 02/26/15  DG Cervical Spine Complete   Narrative CLINICAL DATA:  Onset of numbness, tingling in the arms and finger tips associated with neck pain since gyn surgery in July.  EXAM: CERVICAL SPINE - COMPLETE 4+ VIEW  COMPARISON:  None in PACs  FINDINGS: The cervical vertebral bodies are  preserved in height. There is moderate degenerative disc space narrowing at C5-6 with milder narrowing at C6-7. There are anterior posterior endplate osteophytes at these levels. There is 3 mm of grade 1 anterolisthesis of C4 with respect to C5. There is no perched facet. There is mild bony encroachment upon the neural foramina bilaterally at multiple levels. The odontoid is intact. The prevertebral soft tissue spaces are normal.  IMPRESSION: 1. There is no cervical spine compression fracture nor other acute bony abnormality. 2. Moderate degenerative disc disease centered at C5-6 with milder changes at C6-7. Grade 1 anterolisthesis of C4 with respect to C5 likely on the basis of degenerative disc disease. 3. Moderate multilevel bony encroachment upon the neural foramina bilaterally. Given these findings and the patient's symptoms, cervical spine MRI may be useful.   Electronically Signed   By: David  Martinique M.D.   On: 02/26/2015 10:10    Lumbosacral Imaging: Lumbar MR wo contrast:  Results for orders placed in visit on 11/08/11  [place] W/O Cm   Narrative * PRIOR REPORT IMPORTED FROM AN EXTERNAL SYSTEM *  PRIOR REPORT IMPORTED FROM THE [facility] WORKFLOW SYSTEM   REASON FOR EXAM:    low back pain lumbar radiculitis  lumbar stenosis  COMMENTS:   PROCEDURE:     MMR - MMR LUMBAR SPINE WO CONTRAST  - Nov 08 2011 11:20AM   RESULT:   TECHNIQUE: Multiplanar and multisequence imaging of the lumbar spine was  obtained without administration of Gadolinium.   FINDINGS:  The conus medullaris terminates at L2 level. The cauda equina  demonstrate no evidence of clumping or thickening.   At the T12-L1 level, there is no evidence of thecal sac stenosis or  neuroforaminal narrowing.   At the L1-2 level, a focal disc protrusion is appreciated demonstrating  cranial migration. This causes mass effect upon the right paracentral  portion of the thecal sac. There is mild thecal sac  stenosis appreciated  secondary to this finding. There is no evidence of neuroforaminal  narrowing.   At the L2-3 disc space level, there is no evidence of thecal sac stenosis  or  neuroforaminal narrowing.   At the L3-4 disc space level, a broad-based disc bulge is appreciated  causing partial effacement of the anterior CSF space contributing to  multifactorial mild thecal sac narrowing. There is moderate to severe  multifactorial neuroforaminal narrowing on the left with findings  concerning  for exiting nerve root compromise and possible compression. Mild to  moderate  stenosis is appreciated on the right and also multifactorial with possible  exiting nerve root compromise.   At the L4-5 disc space level, a broad-based disc bulge is appreciated  causing partial effacement of the anterior CSF space with resulting mild  thecal sac narrowing. There is no evidence of neuroforaminal narrowing.   At the L5-S1 level, a broad-based disc bulge is appreciated demonstrating  lateralization to the right and left. This causes partial effacement of  the  anterior CSF space. The lateralized portion of the disc contributes to  multifactorial moderate to severe neuroforaminal narrowing on the left as  well as moderate on the right and again multifactorial. Exiting nerve root  compromise and compression on the left is of diagnostic concern with  findings concerning for mild compression and compromise on the right.   The osseous structures demonstrate Modic end-plate changes at the L3-4  level  without further evidence of marrow edema.   IMPRESSION:      Multilevel, multifactorial degenerative changes with  areas  of moderate to severe neuroforaminal narrowing on the left at L3-4 and  L5-S1. At these levels there are findings concerning for exiting nerve  root  compromise and compression. Areas of moderate neuroforaminal narrowing are  also identified at L5-S1 on the right and L3-4 and on  the left at L4-5.   Thank you for this opportunity to contribute to the care of your patient.       Meds  The patient has a current medication list which includes the following prescription(s): albuterol, aspirin ec, atorvastatin, bupropion, cetirizine, fluticasone, hydrocodone-acetaminophen, hydrocodone-acetaminophen, hydrocodone-acetaminophen, ibuprofen, lamotrigine, lorazepam, metaxalone, metformin, montelukast, omeprazole, paroxetine, and spironolactone.  Current Outpatient Prescriptions on File Prior to Visit  Medication Sig  . albuterol (PROVENTIL HFA;VENTOLIN HFA) 108 (90 BASE) MCG/ACT inhaler Inhale 2 puffs into the lungs every 6 (six) hours as needed for wheezing or shortness of breath.  Marland Kitchen aspirin EC 81 MG tablet Take 1 tablet (81 mg total) by mouth daily.  Marland Kitchen atorvastatin (LIPITOR) 10 MG tablet Take 1 tablet (10 mg total) by mouth daily.  Marland Kitchen buPROPion (  WELLBUTRIN XL) 300 MG 24 hr tablet Take 300 mg by mouth daily.  . cetirizine (ZYRTEC) 10 MG tablet Take 10 mg by mouth at bedtime.  . fluticasone (FLONASE) 50 MCG/ACT nasal spray Place 2 sprays into both nostrils daily as needed for rhinitis.  Marland Kitchen ibuprofen (ADVIL,MOTRIN) 200 MG tablet Take 800 mg by mouth every 4 (four) hours as needed for headache or mild pain.   Marland Kitchen lamoTRIgine (LAMICTAL) 200 MG tablet Take 300 mg by mouth at bedtime.   Marland Kitchen LORazepam (ATIVAN) 1 MG tablet Take 0.5-1 mg by mouth every 8 (eight) hours as needed for anxiety or sleep.   . metFORMIN (GLUCOPHAGE) 500 MG tablet Take 500 mg by mouth daily with breakfast.  . montelukast (SINGULAIR) 10 MG tablet Take 1 tablet (10 mg total) by mouth at bedtime.  Marland Kitchen omeprazole (PRILOSEC) 20 MG capsule Take 1 capsule (20 mg total) by mouth daily.  Marland Kitchen PARoxetine (PAXIL-CR) 25 MG 24 hr tablet Take 50 mg by mouth at bedtime.   Marland Kitchen spironolactone (ALDACTONE) 25 MG tablet Take 25 mg by mouth daily. Reported on 06/27/2015   No current facility-administered medications on file prior to visit.     ROS  Constitutional: Denies any fever or chills Gastrointestinal: No reported hemesis, hematochezia, vomiting, or acute GI distress Musculoskeletal: Denies any acute onset joint swelling, redness, loss of ROM, or weakness Neurological: No reported episodes of acute onset apraxia, aphasia, dysarthria, agnosia, amnesia, paralysis, loss of coordination, or loss of consciousness  Allergies  Ms. Holian is allergic to oxycodone; penicillins; and sulfa antibiotics.  [place]  Medical:  Ms. Montufar  has a past medical history of Depression; Vaginal delivery; Elective abortion; Asthma; Diabetes mellitus; Anxiety; Hypertension; Degenerative disc disease; Degenerative lumbar disc; and GERD (gastroesophageal reflux disease). Family: family history includes Asthma in her maternal grandmother; Diabetes in her father; Hypertension in her father and mother; Stroke in her maternal grandfather and maternal grandmother. Surgical:  has past surgical history that includes Cholecystectomy (2001); Lapband (04/2012); Wisdom tooth extraction; and Dilatation & curettage/hysteroscopy with myosure (N/A, 09/10/2014). Tobacco:  reports that she has never smoked. She has never used smokeless tobacco. Alcohol:  reports that she does not drink alcohol. Drug:  reports that she does not use illicit drugs.  Constitutional Exam  Vitals: Blood pressure 148/75, pulse 90, temperature 98.3 F (36.8 C), temperature source Oral, resp. rate 14, height 5' 4.5" (1.638 m), weight 252 lb (114.306 kg), SpO2 100 %. General appearance: Well nourished, well developed, and well hydrated. In no acute distress Calculated BMI/Body habitus: Body mass index is 42.6 kg/(m^2). (>40 kg/m2) Extreme obesity (Class III) - 254% higher incidence of chronic pain Psych/Mental status: Alert and oriented x 3 (person, place, & time) Eyes: PERLA Respiratory: No evidence of acute respiratory distress  Cervical Spine Exam  Inspection: No masses, redness, or  swelling Alignment: Symmetrical ROM: Functional: Diminished ROM Stability: No instability detected Muscle strength & Tone: Functionally intact Sensory: Unimpaired Palpation: Tender  Upper Extremity (UE) Exam    Side: Right upper extremity  Side: Left upper extremity  Inspection: No masses, redness, swelling, or asymmetry  Inspection: No masses, redness, swelling, or asymmetry  ROM:  ROM:  Functional: ROM is within functional limits [facility])  Functional: ROM is within functional limits [facility])  Muscle strength & Tone: Functionally intact  Muscle strength & Tone: Functionally intact  Sensory: Positive Phalen's sign for carpal tunnel syndrome  Sensory: Positive Phalen's sign and Tinel's sign for carpal tunnel syndrome  Palpation: Non-contributory  Palpation:  Non-contributory   Thoracic Spine Exam  Inspection: No masses, redness, or swelling Alignment: Symmetrical ROM: Functional: ROM is within functional limits [facility]) Stability: No instability detected Sensory: Unimpaired Muscle strength & Tone: Functionally intact Palpation: No complaints of tenderness  Lumbar Spine Exam  Inspection: No masses, redness, or swelling Alignment: Symmetrical ROM: Functional: Decreased ROM Stability: No instability detected Muscle strength & Tone: Functionally intact Sensory: Unimpaired Palpation: Tender Provocative Tests: Lumbar Hyperextension and rotation test: Positive for bilateral lumbar facet pain Patrick's Maneuver: deferred  Gait & Posture Assessment  Ambulation: Unassisted Gait: Unaffected Posture: WNL  Lower Extremity Exam    Side: Right lower extremity  Side: Left lower extremity  Inspection: No masses, redness, swelling, or asymmetry ROM:  Inspection: No masses, redness, swelling, or asymmetry ROM:  Functional: ROM is within functional limits [facility])  Functional: ROM is within functional limits [facility])  Muscle strength & Tone: Functionally intact  Muscle strength & Tone: Functionally  intact  Sensory: Unimpaired  Sensory: Unimpaired  Palpation: Non-contributory  Palpation: Non-contributory   Assessment & Plan  Primary Diagnosis & Pertinent Problem List: The primary encounter diagnosis was Chronic pain. 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  Visit Diagnosis: 1. Chronic pain   2. Muscle spasm, nocturnal   3. Musculoskeletal pain   4. Lumbar facet syndrome (Bilateral)   5. Opiate use (10 MME/Day)   6. 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 Chronic low back pain   8. Chronic neck pain   9. Chronic pain of left lower extremity   10. Chronic radicular lumbar pain (Left)   11. Radiculitis involving upper extremity   12. Cervical foraminal stenosis (multilevel) (Bilateral)   13. Chronic cervical radicular pain   14. Long term current use of opiate analgesic   15. Carpal tunnel syndrome (Bilateral) (L>R)   16. Grade 1 anterolisthesis of C4 with respect to C5   17. Cervical facet syndrome (Location of Tertiary source of pain) (Bilateral) (L>R)     Problems updated and reviewed during this visit: Problem  Muscle Spasm, Nocturnal  Musculoskeletal Pain  Carpal tunnel syndrome (Bilateral) (L>R)   Positive Phalen's test bilaterally and positive Tinel's test on the left.   Cervical facet syndrome (Location of Tertiary source of pain) (Bilateral) (L>R)   Chronic neck pain (Location of Tertiary source of pain) (Bilateral) (L>R)  Chronic cervical radicular pain (Bilateral) (L>R)  Chronic low back pain (Location of Primary Source of Pain) (Bilateral) (L>R)  Chronic lower extremity pain (Location of Secondary source of pain) (Left)   Pain goes down to the level of the knee through the lateral portion of the leg.   Opiate use (10 MME/Day)  Long Term Prescription Opiate Use  Metabolic Syndrome X   Qualifier: Diagnosis of  By: Maxie Better FNP, Rosalita Levan      Problem-specific Plan(s): No problem-specific assessment & plan notes found for this encounter.  No new assessment & plan notes have been filed under this hospital service since the last note was generated.  Service: Pain Management   Plan of Care   Problem List Items Addressed This Visit      High   Carpal tunnel syndrome (Bilateral) (L>R) (Chronic)   Relevant Medications   metaxalone (SKELAXIN) 800 MG tablet   Cervical facet syndrome (Location of Tertiary source of pain) (Bilateral) (L>R) (Chronic)   Relevant Orders   CERVICAL FACET (MEDIAL BRANCH NERVE BLOCK)    Cervical foraminal stenosis (multilevel) (Bilateral) (Chronic)   Relevant Orders   CERVICAL EPIDURAL STEROID INJECTION   Chronic cervical radicular pain (Bilateral) (L>R) (Chronic)   Relevant Medications   metaxalone (SKELAXIN) 800 MG tablet   Other Relevant Orders   CERVICAL EPIDURAL STEROID INJECTION   Chronic low back pain (Location of Primary Source of Pain) (Bilateral) (L>R) (Chronic)   Relevant Medications   HYDROcodone-acetaminophen (NORCO/VICODIN) 5-325 MG tablet   metaxalone (SKELAXIN) 800 MG tablet   HYDROcodone-acetaminophen (NORCO/VICODIN) 5-325 MG tablet   HYDROcodone-acetaminophen (NORCO/VICODIN) 5-325 MG tablet   Other Relevant Orders   LUMBAR FACET(MEDIAL BRANCH NERVE BLOCK) MBNB   Chronic lower extremity pain (Location of Secondary source of pain) (Left) (Chronic)   Relevant Medications    HYDROcodone-acetaminophen (NORCO/VICODIN) 5-325 MG tablet   metaxalone (SKELAXIN) 800 MG tablet   HYDROcodone-acetaminophen (NORCO/VICODIN) 5-325 MG tablet   HYDROcodone-acetaminophen (NORCO/VICODIN) 5-325 MG tablet   Other Relevant Orders   LUMBAR EPIDURAL STEROID INJECTION   Chronic neck pain (Location of Tertiary source of pain) (Bilateral) (L>R) (Chronic)   Relevant Medications   HYDROcodone-acetaminophen (NORCO/VICODIN) 5-325 MG tablet   metaxalone (SKELAXIN) 800 MG tablet   HYDROcodone-acetaminophen (NORCO/VICODIN) 5-325 MG tablet   HYDROcodone-acetaminophen (NORCO/VICODIN) 5-325 MG tablet   Other Relevant Orders   CERVICAL EPIDURAL STEROID INJECTION   Chronic pain - Primary (Chronic)   Relevant Medications   HYDROcodone-acetaminophen (NORCO/VICODIN) 5-325 MG tablet   metaxalone (SKELAXIN) 800 MG tablet   HYDROcodone-acetaminophen (NORCO/VICODIN) 5-325 MG tablet   HYDROcodone-acetaminophen (NORCO/VICODIN) 5-325 MG tablet   Other Relevant Orders   ToxASSURE Select 13 (MW), Urine   C-reactive protein   Magnesium   Sedimentation rate   Vitamin B12   25-Hydroxyvitamin D Lcms D2+D3   Chronic radicular lumbar pain (Left) (Chronic)   Relevant Orders   LUMBAR EPIDURAL STEROID INJECTION   Grade 1 anterolisthesis of C4 with respect to C5 (Chronic)   Relevant Orders   CERVICAL FACET (MEDIAL BRANCH NERVE BLOCK)    Lumbar facet syndrome (Bilateral) (Chronic)   Relevant Medications   HYDROcodone-acetaminophen (NORCO/VICODIN) 5-325 MG tablet   metaxalone (SKELAXIN) 800 MG tablet   HYDROcodone-acetaminophen (NORCO/VICODIN) 5-325 MG tablet   HYDROcodone-acetaminophen (NORCO/VICODIN) 5-325 MG tablet   Other Relevant Orders   LUMBAR FACET(MEDIAL BRANCH NERVE BLOCK) MBNB   Lumbar foraminal stenosis (severe left L3-4) (Chronic)   Relevant Orders   LUMBAR EPIDURAL STEROID INJECTION   Muscle spasm, nocturnal (Chronic)   Relevant Medications   metaxalone (SKELAXIN) 800 MG tablet    Musculoskeletal pain (Chronic)   Relevant Medications   metaxalone (SKELAXIN) 800 MG tablet   Radiculitis involving upper extremity (Chronic)   Relevant Medications   metaxalone (SKELAXIN) 800 MG tablet   Other Relevant Orders   CERVICAL EPIDURAL STEROID INJECTION     Medium   Long term current use of opiate analgesic (Chronic)   Opiate use (10 MME/Day) (Chronic)       Pharmacotherapy (Medications Ordered): Meds ordered this encounter  Medications  . HYDROcodone-acetaminophen (NORCO/VICODIN) 5-325 MG tablet    Sig: Take 0.5-1 tablets by mouth 2 (two) times daily as needed  for severe pain.    Dispense:  60 tablet    Refill:  0    Do not add this medication to the electronic "Automatic Refill" notification system. Patient may have prescription filled one day early if pharmacy is closed on scheduled refill date. Do not fill until: 08/17/15 To last until: 09/16/15  . metaxalone (SKELAXIN) 800 MG tablet    Sig: Take 1 tablet (800 mg total) by mouth 3 (three) times daily as needed for muscle spasms.    Dispense:  90 tablet    Refill:  2    Do not add this medication to the electronic "Automatic Refill" notification system. Patient may have prescription filled one day early if pharmacy is closed on scheduled refill date.  Marland Kitchen HYDROcodone-acetaminophen (NORCO/VICODIN) 5-325 MG tablet    Sig: Take 0.5-1 tablets by mouth 2 (two) times daily as needed for severe pain.    Dispense:  60 tablet    Refill:  0    Do not add this medication to the electronic "Automatic Refill" notification system. Patient may have prescription filled one day early if pharmacy is closed on scheduled refill date. Do not fill until: 09/16/15 To last until: 10/16/15  . HYDROcodone-acetaminophen (NORCO/VICODIN) 5-325 MG tablet    Sig: Take 0.5-1 tablets by mouth 2 (two) times daily as needed for severe pain.    Dispense:  60 tablet    Refill:  0    Do not add this medication to the electronic "Automatic Refill"  notification system. Patient may have prescription filled one day early if pharmacy is closed on scheduled refill date. Do not fill until: 10/16/15 To last until: 11/15/15    [facility] & Procedure Ordered: Orders Placed This Encounter  Procedures  . CERVICAL EPIDURAL STEROID INJECTION  . LUMBAR EPIDURAL STEROID INJECTION  . LUMBAR FACET(MEDIAL BRANCH NERVE BLOCK) MBNB  . CERVICAL FACET (MEDIAL BRANCH NERVE BLOCK)   . ToxASSURE Select 13 (MW), Urine  . C-reactive protein  . Magnesium  . Sedimentation rate  . Vitamin B12  . 25-Hydroxyvitamin D Lcms D2+D3    Imaging Ordered: None  Interventional Therapies: Scheduled:  None at this time.    Considering:   1. Diagnostic left L3-4 lumbar epidural steroid injection under fluoroscopic guidance, with or without sedation.  2. Diagnostic bilateral lumbar facet block under fluoroscopic guidance and IV sedation.  3. Possible bilateral lumbar facet radiofrequency ablation.  4. Diagnostic left-sided cervical epidural steroid injection under fluoroscopic guidance, with or without sedation.  5. Diagnostic bilateral cervical facet block under fluoroscopic guidance and IV sedation.  6. Possible bilateral cervical facet radiofrequency ablation.    PRN Procedures:   1. Diagnostic left L3-4 lumbar epidural steroid injection under fluoroscopic guidance, with or without sedation.  2. Diagnostic bilateral lumbar facet block under fluoroscopic guidance and IV sedation.  3. Diagnostic left-sided cervical epidural steroid injection under fluoroscopic guidance, with or without sedation.  4. Diagnostic bilateral cervical facet block under fluoroscopic guidance and IV sedation.    Referral(s) or Consult(s): None at this time.  New Prescriptions   No medications on file    Medications administered during this visit: Ms. Berenguer had no medications administered during this visit.  Requested PM Follow-up: Return in about 3 months (around 10/20/2015) for  Procedure (PRN - Patient will call), Medication Management, (3-Mo).  Future Appointments Date Time Provider [place]  10/14/2015 9:00 AM Pieter Partridge, DO [facility] None  11/07/2015 10:00 AM [facility]TRAS[facility]ACHINE [facility] None  11/07/2015 10:30 AM  Terrance Mass, MD Wilhe[facility] Bonito    Primary Care Physician: Arnette Norris, MD Location: [facility] Note by: Kathlen Brunswick. Dossie Arbour, M.D, DABA, DABAPM, DABPM, DABIPP, FIPP  Pain Score Disclaimer: We use the NRS-11 scale. This is a self-reported, subjective measurement of pain severity with only modest accuracy. It is used primarily to identify changes within a particular patient. It must be understood that outpatient pain scales are significantly less accurate that those used for research, where they can be applied under ideal controlled circumstances with minimal exposure to variables. In reality, the score is likely to be a combination of pain intensity and pain affect, where pain affect describes the degree of emotional arousal or changes in action readiness caused by the sensory experience of pain. Factors such as social and work situation, setting, emotional state, anxiety levels, expectation, and prior pain experience may influence pain perception and show large inter-individual differences that may also be affected by time variables.  Patient instructions provided during this appointment: Patient Instructions   GENERAL RISKS AND COMPLICATIONS  What are the risk, side effects and possible complications? Generally speaking, most procedures are safe.  However, with any procedure there are risks, side effects, and the possibility of complications.  The risks and complications are dependent upon the sites that are lesioned, or the type of nerve block to be performed.  The closer the procedure is to the spine, the more serious the risks are.  Great care is taken when placing the radio frequency needles, block  needles or lesioning probes, but sometimes complications can occur. 1. Infection: Any time there is an injection through the skin, there is a risk of infection.  This is why sterile conditions are used for these blocks.  There are four possible types of infection. 1. Localized skin infection. 2. Central Nervous System Infection-This can be in the form of Meningitis, which can be deadly. 3. Epidural Infections-This can be in the form of an epidural abscess, which can cause pressure inside of the spine, causing compression of the spinal cord with subsequent paralysis. This would require an emergency surgery to decompress, and there are no guarantees that the patient would recover from the paralysis. 4. Discitis-This is an infection of the intervertebral discs.  It occurs in about 1% of discography procedures.  It is difficult to treat and it may lead to surgery.        2. Pain: the needles have to go through skin and soft tissues, will cause soreness.       3. Damage to internal structures:  The nerves to be lesioned may be near blood vessels or    other nerves which can be potentially damaged.       4. Bleeding: Bleeding is more common if the patient is taking blood thinners such as  aspirin, Coumadin, Ticiid, Plavix, etc., or if he/she have some genetic predisposition  such as hemophilia. Bleeding into the spinal canal can cause compression of the spinal  cord with subsequent paralysis.  This would require an emergency surgery to  decompress and there are no guarantees that the patient would recover from the  paralysis.       5. Pneumothorax:  Puncturing of a lung is a possibility, every time a needle is introduced in  the area of the chest or upper back.  Pneumothorax refers to free air around the  collapsed lung(s), inside of the thoracic cavity (chest cavity).  Another two possible  complications related to a  similar event would include: Hemothorax and Chylothorax.   These are variations of the  Pneumothorax, where instead of air around the collapsed  lung(s), you may have blood or chyle, respectively.       6. Spinal headaches: They may occur with any procedures in the area of the spine.       7. Persistent CSF (Cerebro-Spinal Fluid) leakage: This is a rare problem, but may occur  with prolonged intrathecal or epidural catheters either due to the formation of a fistulous  track or a dural tear.       8. Nerve damage: By working so close to the spinal cord, there is always a possibility of  nerve damage, which could be as serious as a permanent spinal cord injury with  paralysis.       9. Death:  Although rare, severe deadly allergic reactions known as "Anaphylactic  reaction" can occur to any of the medications used.      10. Worsening of the symptoms:  We can always make thing worse.  What are the chances of something like this happening? Chances of any of this occuring are extremely low.  By statistics, you have more of a chance of getting killed in a motor vehicle accident: while driving to the hospital than any of the above occurring .  Nevertheless, you should be aware that they are possibilities.  In general, it is similar to taking a shower.  Everybody knows that you can slip, hit your head and get killed.  Does that mean that you should not shower again?  Nevertheless always keep in mind that statistics do not mean anything if you happen to be on the wrong side of them.  Even if a procedure has a 1 (one) in a 1,000,000 (million) chance of going wrong, it you happen to be that one..Also, keep in mind that by statistics, you have more of a chance of having something go wrong when taking medications.  Who should not have this procedure? If you are on a blood thinning medication (e.g. Coumadin, Plavix, see list of "Blood Thinners"), or if you have an active infection going on, you should not have the procedure.  If you are taking any blood thinners, please inform your physician.  How  should I prepare for this procedure?  Do not eat or drink anything at least six hours prior to the procedure.  Bring a driver with you .  It cannot be a taxi.  Come accompanied by an adult that can drive you back, and that is strong enough to help you if your legs get weak or numb from the local anesthetic.  Take all of your medicines the morning of the procedure with just enough water to swallow them.  If you have diabetes, make sure that you are scheduled to have your procedure done first thing in the morning, whenever possible.  If you have diabetes, take only half of your insulin dose and notify our nurse that you have done so as soon as you arrive at the clinic.  If you are diabetic, but only take blood sugar pills (oral hypoglycemic), then do not take them on the morning of your procedure.  You may take them after you have had the procedure.  Do not take aspirin or any aspirin-containing medications, at least eleven (11) days prior to the procedure.  They may prolong bleeding.  Wear loose fitting clothing that may be easy to take off and that you would not mind  if it got stained with Betadine or blood.  Do not wear any jewelry or perfume  Remove any nail coloring.  It will interfere with some of our monitoring equipment.  NOTE: Remember that this is not meant to be interpreted as a complete list of all possible complications.  Unforeseen problems may occur.  BLOOD THINNERS The following drugs contain aspirin or other products, which can cause increased bleeding during surgery and should not be taken for 2 weeks prior to and 1 week after surgery.  If you should need take something for relief of minor pain, you may take acetaminophen which is found in Tylenol,m Datril, Anacin-3 and Panadol. It is not blood thinner. The products listed below are.  Do not take any of the products listed below in addition to any listed on your instruction sheet.  A.P.C or A.P.C with Codeine Codeine  Phosphate Capsules #3 Ibuprofen Ridaura  ABC compound Congesprin Imuran rimadil  Advil Cope Indocin Robaxisal  Alka-Seltzer Effervescent Pain Reliever and Antacid Coricidin or Coricidin-D  Indomethacin Rufen  Alka-Seltzer plus Cold Medicine Cosprin Ketoprofen S-A-C Tablets  Anacin Analgesic Tablets or Capsules Coumadin Korlgesic Salflex  Anacin Extra Strength Analgesic tablets or capsules CP-2 Tablets Lanoril Salicylate  Anaprox Cuprimine Capsules Levenox Salocol  Anexsia-D Dalteparin Magan Salsalate  Anodynos Darvon compound Magnesium Salicylate Sine-off  Ansaid Dasin Capsules Magsal Sodium Salicylate  Anturane Depen Capsules Marnal Soma  APF Arthritis pain formula Dewitt's Pills Measurin Stanback  Argesic Dia-Gesic Meclofenamic Sulfinpyrazone  Arthritis Bayer Timed Release Aspirin Diclofenac Meclomen Sulindac  Arthritis pain formula Anacin Dicumarol Medipren Supac  Analgesic (Safety coated) Arthralgen Diffunasal Mefanamic Suprofen  Arthritis Strength Bufferin Dihydrocodeine Mepro Compound Suprol  Arthropan liquid Dopirydamole Methcarbomol with Aspirin Synalgos  ASA tablets/Enseals Disalcid Micrainin Tagament  Ascriptin Doan's Midol Talwin  Ascriptin A/D Dolene Mobidin Tanderil  Ascriptin Extra Strength Dolobid Moblgesic Ticlid  Ascriptin with Codeine Doloprin or Doloprin with Codeine Momentum Tolectin  Asperbuf Duoprin Mono-gesic Trendar  Aspergum Duradyne Motrin or Motrin IB Triminicin  Aspirin plain, buffered or enteric coated Durasal Myochrisine Trigesic  Aspirin Suppositories Easprin Nalfon Trillsate  Aspirin with Codeine Ecotrin Regular or Extra Strength Naprosyn Uracel  Atromid-S Efficin Naproxen Ursinus  Auranofin Capsules Elmiron Neocylate Vanquish  Axotal Emagrin Norgesic Verin  Azathioprine Empirin or Empirin with Codeine Normiflo Vitamin E  Azolid Emprazil Nuprin Voltaren  Bayer Aspirin plain, buffered or children's or timed BC Tablets or powders Encaprin Orgaran  Warfarin Sodium  Buff-a-Comp Enoxaparin Orudis Zorpin  Buff-a-Comp with Codeine Equegesic Os-Cal-Gesic   Buffaprin Excedrin plain, buffered or Extra Strength Oxalid   Bufferin Arthritis Strength Feldene Oxphenbutazone   Bufferin plain or Extra Strength Feldene Capsules Oxycodone with Aspirin   Bufferin with Codeine Fenoprofen Fenoprofen Pabalate or Pabalate-SF   Buffets II Flogesic Panagesic   Buffinol plain or Extra Strength Florinal or Florinal with Codeine Panwarfarin   Buf-Tabs Flurbiprofen Penicillamine   Butalbital Compound Four-way cold tablets Penicillin   Butazolidin Fragmin Pepto-Bismol   Carbenicillin Geminisyn Percodan   Carna Arthritis Reliever Geopen Persantine   Carprofen Gold's salt Persistin   Chloramphenicol Goody's Phenylbutazone   Chloromycetin Haltrain Piroxlcam   Clmetidine heparin Plaquenil   Cllnoril Hyco-pap Ponstel   Clofibrate Hydroxy chloroquine Propoxyphen         Before stopping any of these medications, be sure to consult the physician who ordered them.  Some, such as Coumadin (Warfarin) are ordered to prevent or treat serious conditions such as "deep thrombosis", "pumonary embolisms", and other heart problems.  The amount  of time that you may need off of the medication may also vary with the medication and the reason for which you were taking it.  If you are taking any of these medications, please make sure you notify your pain physician before you undergo any procedures.     Facet Blocks Patient Information  Description: The facets are joints in the spine between the vertebrae.  Like any joints in the body, facets can become irritated and painful.  Arthritis can also effect the facets.  By injecting steroids and local anesthetic in and around these joints, we can temporarily block the nerve supply to them.  Steroids act directly on irritated nerves and tissues to reduce selling and inflammation which often leads to decreased pain.  Facet blocks may be  done anywhere along the spine from the neck to the low back depending upon the location of your pain.   After numbing the skin with local anesthetic (like Novocaine), a small needle is passed onto the facet joints under x-ray guidance.  You may experience a sensation of pressure while this is being done.  The entire block usually lasts about 15-25 minutes.   Conditions which may be treated by facet blocks:   Low back/buttock pain  Neck/shoulder pain  Certain types of headaches  Preparation for the injection:  1. Do not eat any solid food or dairy products within 8 hours of your appointment. 2. You may drink clear liquid up to 3 hours before appointment.  Clear liquids include water, black coffee, juice or soda.  No milk or cream please. 3. You may take your regular medication, including pain medications, with a sip of water before your appointment.  Diabetics should hold regular insulin (if taken separately) and take 1/2 normal NPH dose the morning of the procedure.  Carry some sugar containing items with you to your appointment. 4. A driver must accompany you and be prepared to drive you home after your procedure. 5. Bring all your current medications with you. 6. An IV may be inserted and sedation may be given at the discretion of the physician. 7. A blood pressure cuff, EKG and other monitors will often be applied during the procedure.  Some patients may need to have extra oxygen administered for a short period. 8. You will be asked to provide medical information, including your allergies and medications, prior to the procedure.  We must know immediately if you are taking blood thinners (like Coumadin/Warfarin) or if you are allergic to IV iodine contrast (dye).  We must know if you could possible be pregnant.  Possible side-effects:   Bleeding from needle site  Infection (rare, may require surgery)  Nerve injury (rare)  Numbness & tingling (temporary)  Difficulty urinating  (rare, temporary)  Spinal headache (a headache worse with upright posture)  Light-headedness (temporary)  Pain at injection site (serveral days)  Decreased blood pressure (rare, temporary)  Weakness in arm/leg (temporary)  Pressure sensation in back/neck (temporary)   Call if you experience:   Fever/chills associated with headache or increased back/neck pain  Headache worsened by an upright position  New onset, weakness or numbness of an extremity below the injection site  Hives or difficulty breathing (go to the emergency room)  Inflammation or drainage at the injection site(s)  Severe back/neck pain greater than usual  New symptoms which are concerning to you  Please note:  Although the local anesthetic injected can often make your back or neck feel good for several hours after the  injection, the pain will likely return. It takes 3-7 days for steroids to work.  You may not notice any pain relief for at least one week.  If effective, we will often do a series of 2-3 injections spaced 3-6 weeks apart to maximally decrease your pain.  After the initial series, you may be a candidate for a more permanent nerve block of the facets.  If you have any questions, please call #[phone] [facility]  Epidural Steroid Injection Patient Information  Description: The epidural space surrounds the nerves as they exit the spinal cord.  In some patients, the nerves can be compressed and inflamed by a bulging disc or a tight spinal canal (spinal stenosis).  By injecting steroids into the epidural space, we can bring irritated nerves into direct contact with a potentially helpful medication.  These steroids act directly on the irritated nerves and can reduce swelling and inflammation which often leads to decreased pain.  Epidural steroids may be injected anywhere along the spine and from the neck to the low back depending upon the location of your  pain.   After numbing the skin with local anesthetic (like Novocaine), a small needle is passed into the epidural space slowly.  You may experience a sensation of pressure while this is being done.  The entire block usually last less than 10 minutes.  Conditions which may be treated by epidural steroids:   Low back and leg pain  Neck and arm pain  Spinal stenosis  Post-laminectomy syndrome  Herpes zoster (shingles) pain  Pain from compression fractures  Preparation for the injection:  1. Do not eat any solid food or dairy products within 8 hours of your appointment.  2. You may drink clear liquids up to 3 hours before appointment.  Clear liquids include water, black coffee, juice or soda.  No milk or cream please. 3. You may take your regular medication, including pain medications, with a sip of water before your appointment  Diabetics should hold regular insulin (if taken separately) and take 1/2 normal NPH dos the morning of the procedure.  Carry some sugar containing items with you to your appointment. 4. A driver must accompany you and be prepared to drive you home after your procedure.  5. Bring all your current medications with your. 6. An IV may be inserted and sedation may be given at the discretion of the physician.   7. A blood pressure cuff, EKG and other monitors will often be applied during the procedure.  Some patients may need to have extra oxygen administered for a short period. 8. You will be asked to provide medical information, including your allergies, prior to the procedure.  We must know immediately if you are taking blood thinners (like Coumadin/Warfarin)  Or if you are allergic to IV iodine contrast (dye). We must know if you could possible be pregnant.  Possible side-effects:  Bleeding from needle site  Infection (rare, may require surgery)  Nerve injury (rare)  Numbness & tingling (temporary)  Difficulty urinating (rare, temporary)  Spinal headache (  a headache worse with upright posture)  Light -headedness (temporary)  Pain at injection site (several days)  Decreased blood pressure (temporary)  Weakness in arm/leg (temporary)  Pressure sensation in back/neck (temporary)  Call if you experience:  Fever/chills associated with headache or increased back/neck pain.  Headache worsened by an upright position.  New onset weakness or numbness of an extremity below the injection site  Hives or  difficulty breathing (go to the emergency room)  Inflammation or drainage at the infection site  Severe back/neck pain  Any new symptoms which are concerning to you  Please note:  Although the local anesthetic injected can often make your back or neck feel good for several hours after the injection, the pain will likely return.  It takes 3-7 days for steroids to work in the epidural space.  You may not notice any pain relief for at least that one week.  If effective, we will often do a series of three injections spaced 3-6 weeks apart to maximally decrease your pain.  After the initial series, we generally will wait several months before considering a repeat injection of the same type.  If you have any questions, please call [phone] [facility]  PRN PROCEDURES: CERVICAL EPIDURAL STEROID INJECTION FOR NECK AND UPPER EXTREMITY PAIN BILATERAL LUMBAR FACET BLOCK FOR LOW BACK PAIN LUMBAR EPIDURAL STEROID INJECTION FOR LOWER EXTREMITY PAIN

## 2015-08-05 NOTE — Progress Notes (Signed)
Had a TIA 06/11/15, [facility] to [facility]. Started on aspirin and lipitor. Pill count: Hydrocodone 25.5/60; filled 07/14/15 Has two scripts that was not filled. Would like to have script for Skelaxin also.

## 2015-08-05 NOTE — Patient Instructions (Addendum)
GENERAL RISKS AND COMPLICATIONS  What are the risk, side effects and possible complications? Generally speaking, most procedures are safe.  However, with any procedure there are risks, side effects, and the possibility of complications.  The risks and complications are dependent upon the sites that are lesioned, or the type of nerve block to be performed.  The closer the procedure is to the spine, the more serious the risks are.  Great care is taken when placing the radio frequency needles, block needles or lesioning probes, but sometimes complications can occur. 1. Infection: Any time there is an injection through the skin, there is a risk of infection.  This is why sterile conditions are used for these blocks.  There are four possible types of infection. 1. Localized skin infection. 2. Central Nervous System Infection-This can be in the form of Meningitis, which can be deadly. 3. Epidural Infections-This can be in the form of an epidural abscess, which can cause pressure inside of the spine, causing compression of the spinal cord with subsequent paralysis. This would require an emergency surgery to decompress, and there are no guarantees that the patient would recover from the paralysis. 4. Discitis-This is an infection of the intervertebral discs.  It occurs in about 1% of discography procedures.  It is difficult to treat and it may lead to surgery.        2. Pain: the needles have to go through skin and soft tissues, will cause soreness.       3. Damage to internal structures:  The nerves to be lesioned may be near blood vessels or    other nerves which can be potentially damaged.       4. Bleeding: Bleeding is more common if the patient is taking blood thinners such as  aspirin, Coumadin, Ticiid, Plavix, etc., or if he/she have some genetic predisposition  such as hemophilia. Bleeding into the spinal canal can cause compression of the spinal  cord with subsequent paralysis.  This would require an  emergency surgery to  decompress and there are no guarantees that the patient would recover from the  paralysis.       5. Pneumothorax:  Puncturing of a lung is a possibility, every time a needle is introduced in  the area of the chest or upper back.  Pneumothorax refers to free air around the  collapsed lung(s), inside of the thoracic cavity (chest cavity).  Another two possible  complications related to a similar event would include: Hemothorax and Chylothorax.   These are variations of the Pneumothorax, where instead of air around the collapsed  lung(s), you may have blood or chyle, respectively.       6. Spinal headaches: They may occur with any procedures in the area of the spine.       7. Persistent CSF (Cerebro-Spinal Fluid) leakage: This is a rare problem, but may occur  with prolonged intrathecal or epidural catheters either due to the formation of a fistulous  track or a dural tear.       8. Nerve damage: By working so close to the spinal cord, there is always a possibility of  nerve damage, which could be as serious as a permanent spinal cord injury with  paralysis.       9. Death:  Although rare, severe deadly allergic reactions known as "Anaphylactic  reaction" can occur to any of the medications used.      10. Worsening of the symptoms:  We can always make thing worse.    What are the chances of something like this happening? Chances of any of this occuring are extremely low.  By statistics, you have more of a chance of getting killed in a motor vehicle accident: while driving to the hospital than any of the above occurring .  Nevertheless, you should be aware that they are possibilities.  In general, it is similar to taking a shower.  Everybody knows that you can slip, hit your head and get killed.  Does that mean that you should not shower again?  Nevertheless always keep in mind that statistics do not mean anything if you happen to be on the wrong side of them.  Even if a procedure has a 1  (one) in a 1,000,000 (million) chance of going wrong, it you happen to be that one..Also, keep in mind that by statistics, you have more of a chance of having something go wrong when taking medications.  Who should not have this procedure? If you are on a blood thinning medication (e.g. Coumadin, Plavix, see list of "Blood Thinners"), or if you have an active infection going on, you should not have the procedure.  If you are taking any blood thinners, please inform your physician.  How should I prepare for this procedure?  Do not eat or drink anything at least six hours prior to the procedure.  Bring a driver with you .  It cannot be a taxi.  Come accompanied by an adult that can drive you back, and that is strong enough to help you if your legs get weak or numb from the local anesthetic.  Take all of your medicines the morning of the procedure with just enough water to swallow them.  If you have diabetes, make sure that you are scheduled to have your procedure done first thing in the morning, whenever possible.  If you have diabetes, take only half of your insulin dose and notify our nurse that you have done so as soon as you arrive at the clinic.  If you are diabetic, but only take blood sugar pills (oral hypoglycemic), then do not take them on the morning of your procedure.  You may take them after you have had the procedure.  Do not take aspirin or any aspirin-containing medications, at least eleven (11) days prior to the procedure.  They may prolong bleeding.  Wear loose fitting clothing that may be easy to take off and that you would not mind if it got stained with Betadine or blood.  Do not wear any jewelry or perfume  Remove any nail coloring.  It will interfere with some of our monitoring equipment.  NOTE: Remember that this is not meant to be interpreted as a complete list of all possible complications.  Unforeseen problems may occur.  BLOOD THINNERS The following drugs  contain aspirin or other products, which can cause increased bleeding during surgery and should not be taken for 2 weeks prior to and 1 week after surgery.  If you should need take something for relief of minor pain, you may take acetaminophen which is found in Tylenol,m Datril, Anacin-3 and Panadol. It is not blood thinner. The products listed below are.  Do not take any of the products listed below in addition to any listed on your instruction sheet.  A.P.C or A.P.C with Codeine Codeine Phosphate Capsules #3 Ibuprofen Ridaura  ABC compound Congesprin Imuran rimadil  Advil Cope Indocin Robaxisal  Alka-Seltzer Effervescent Pain Reliever and Antacid Coricidin or Coricidin-D  Indomethacin Rufen    Alka-Seltzer plus Cold Medicine Cosprin Ketoprofen S-A-C Tablets  Anacin Analgesic Tablets or Capsules Coumadin Korlgesic Salflex  Anacin Extra Strength Analgesic tablets or capsules CP-2 Tablets Lanoril Salicylate  Anaprox Cuprimine Capsules Levenox Salocol  Anexsia-D Dalteparin Magan Salsalate  Anodynos Darvon compound Magnesium Salicylate Sine-off  Ansaid Dasin Capsules Magsal Sodium Salicylate  Anturane Depen Capsules Marnal Soma  APF Arthritis pain formula Dewitt's Pills Measurin Stanback  Argesic Dia-Gesic Meclofenamic Sulfinpyrazone  Arthritis Bayer Timed Release Aspirin Diclofenac Meclomen Sulindac  Arthritis pain formula Anacin Dicumarol Medipren Supac  Analgesic (Safety coated) Arthralgen Diffunasal Mefanamic Suprofen  Arthritis Strength Bufferin Dihydrocodeine Mepro Compound Suprol  Arthropan liquid Dopirydamole Methcarbomol with Aspirin Synalgos  ASA tablets/Enseals Disalcid Micrainin Tagament  Ascriptin Doan's Midol Talwin  Ascriptin A/D Dolene Mobidin Tanderil  Ascriptin Extra Strength Dolobid Moblgesic Ticlid  Ascriptin with Codeine Doloprin or Doloprin with Codeine Momentum Tolectin  Asperbuf Duoprin Mono-gesic Trendar  Aspergum Duradyne Motrin or Motrin IB Triminicin  Aspirin  plain, buffered or enteric coated Durasal Myochrisine Trigesic  Aspirin Suppositories Easprin Nalfon Trillsate  Aspirin with Codeine Ecotrin Regular or Extra Strength Naprosyn Uracel  Atromid-S Efficin Naproxen Ursinus  Auranofin Capsules Elmiron Neocylate Vanquish  Axotal Emagrin Norgesic Verin  Azathioprine Empirin or Empirin with Codeine Normiflo Vitamin E  Azolid Emprazil Nuprin Voltaren  Bayer Aspirin plain, buffered or children's or timed BC Tablets or powders Encaprin Orgaran Warfarin Sodium  Buff-a-Comp Enoxaparin Orudis Zorpin  Buff-a-Comp with Codeine Equegesic Os-Cal-Gesic   Buffaprin Excedrin plain, buffered or Extra Strength Oxalid   Bufferin Arthritis Strength Feldene Oxphenbutazone   Bufferin plain or Extra Strength Feldene Capsules Oxycodone with Aspirin   Bufferin with Codeine Fenoprofen Fenoprofen Pabalate or Pabalate-SF   Buffets II Flogesic Panagesic   Buffinol plain or Extra Strength Florinal or Florinal with Codeine Panwarfarin   Buf-Tabs Flurbiprofen Penicillamine   Butalbital Compound Four-way cold tablets Penicillin   Butazolidin Fragmin Pepto-Bismol   Carbenicillin Geminisyn Percodan   Carna Arthritis Reliever Geopen Persantine   Carprofen Gold's salt Persistin   Chloramphenicol Goody's Phenylbutazone   Chloromycetin Haltrain Piroxlcam   Clmetidine heparin Plaquenil   Cllnoril Hyco-pap Ponstel   Clofibrate Hydroxy chloroquine Propoxyphen         Before stopping any of these medications, be sure to consult the physician who ordered them.  Some, such as Coumadin (Warfarin) are ordered to prevent or treat serious conditions such as "deep thrombosis", "pumonary embolisms", and other heart problems.  The amount of time that you may need off of the medication may also vary with the medication and the reason for which you were taking it.  If you are taking any of these medications, please make sure you notify your pain physician before you undergo any  procedures.     Facet Blocks Patient Information  Description: The facets are joints in the spine between the vertebrae.  Like any joints in the body, facets can become irritated and painful.  Arthritis can also effect the facets.  By injecting steroids and local anesthetic in and around these joints, we can temporarily block the nerve supply to them.  Steroids act directly on irritated nerves and tissues to reduce selling and inflammation which often leads to decreased pain.  Facet blocks may be done anywhere along the spine from the neck to the low back depending upon the location of your pain.   After numbing the skin with local anesthetic (like Novocaine), a small needle is passed onto the facet joints under x-ray guidance.  You may experience  a sensation of pressure while this is being done.  The entire block usually lasts about 15-25 minutes.   Conditions which may be treated by facet blocks:   Low back/buttock pain  Neck/shoulder pain  Certain types of headaches  Preparation for the injection:  1. Do not eat any solid food or dairy products within 8 hours of your appointment. 2. You may drink clear liquid up to 3 hours before appointment.  Clear liquids include water, black coffee, juice or soda.  No milk or cream please. 3. You may take your regular medication, including pain medications, with a sip of water before your appointment.  Diabetics should hold regular insulin (if taken separately) and take 1/2 normal NPH dose the morning of the procedure.  Carry some sugar containing items with you to your appointment. 4. A driver must accompany you and be prepared to drive you home after your procedure. 5. Bring all your current medications with you. 6. An IV may be inserted and sedation may be given at the discretion of the physician. 7. A blood pressure cuff, EKG and other monitors will often be applied during the procedure.  Some patients may need to have extra oxygen administered  for a short period. 8. You will be asked to provide medical information, including your allergies and medications, prior to the procedure.  We must know immediately if you are taking blood thinners (like Coumadin/Warfarin) or if you are allergic to IV iodine contrast (dye).  We must know if you could possible be pregnant.  Possible side-effects:   Bleeding from needle site  Infection (rare, may require surgery)  Nerve injury (rare)  Numbness & tingling (temporary)  Difficulty urinating (rare, temporary)  Spinal headache (a headache worse with upright posture)  Light-headedness (temporary)  Pain at injection site (serveral days)  Decreased blood pressure (rare, temporary)  Weakness in arm/leg (temporary)  Pressure sensation in back/neck (temporary)   Call if you experience:   Fever/chills associated with headache or increased back/neck pain  Headache worsened by an upright position  New onset, weakness or numbness of an extremity below the injection site  Hives or difficulty breathing (go to the emergency room)  Inflammation or drainage at the injection site(s)  Severe back/neck pain greater than usual  New symptoms which are concerning to you  Please note:  Although the local anesthetic injected can often make your back or neck feel good for several hours after the injection, the pain will likely return. It takes 3-7 days for steroids to work.  You may not notice any pain relief for at least one week.  If effective, we will often do a series of 2-3 injections spaced 3-6 weeks apart to maximally decrease your pain.  After the initial series, you may be a candidate for a more permanent nerve block of the facets.  If you have any questions, please call #[phone] [facility]  Epidural Steroid Injection Patient Information  Description: The epidural space surrounds the nerves as they exit the spinal cord.  In some patients,  the nerves can be compressed and inflamed by a bulging disc or a tight spinal canal (spinal stenosis).  By injecting steroids into the epidural space, we can bring irritated nerves into direct contact with a potentially helpful medication.  These steroids act directly on the irritated nerves and can reduce swelling and inflammation which often leads to decreased pain.  Epidural steroids may be injected anywhere along the spine and from  the neck to the low back depending upon the location of your pain.   After numbing the skin with local anesthetic (like Novocaine), a small needle is passed into the epidural space slowly.  You may experience a sensation of pressure while this is being done.  The entire block usually last less than 10 minutes.  Conditions which may be treated by epidural steroids:   Low back and leg pain  Neck and arm pain  Spinal stenosis  Post-laminectomy syndrome  Herpes zoster (shingles) pain  Pain from compression fractures  Preparation for the injection:  1. Do not eat any solid food or dairy products within 8 hours of your appointment.  2. You may drink clear liquids up to 3 hours before appointment.  Clear liquids include water, black coffee, juice or soda.  No milk or cream please. 3. You may take your regular medication, including pain medications, with a sip of water before your appointment  Diabetics should hold regular insulin (if taken separately) and take 1/2 normal NPH dos the morning of the procedure.  Carry some sugar containing items with you to your appointment. 4. A driver must accompany you and be prepared to drive you home after your procedure.  5. Bring all your current medications with your. 6. An IV may be inserted and sedation may be given at the discretion of the physician.   7. A blood pressure cuff, EKG and other monitors will often be applied during the procedure.  Some patients may need to have extra oxygen administered for a short  period. 8. You will be asked to provide medical information, including your allergies, prior to the procedure.  We must know immediately if you are taking blood thinners (like Coumadin/Warfarin)  Or if you are allergic to IV iodine contrast (dye). We must know if you could possible be pregnant.  Possible side-effects:  Bleeding from needle site  Infection (rare, may require surgery)  Nerve injury (rare)  Numbness & tingling (temporary)  Difficulty urinating (rare, temporary)  Spinal headache ( a headache worse with upright posture)  Light -headedness (temporary)  Pain at injection site (several days)  Decreased blood pressure (temporary)  Weakness in arm/leg (temporary)  Pressure sensation in back/neck (temporary)  Call if you experience:  Fever/chills associated with headache or increased back/neck pain.  Headache worsened by an upright position.  New onset weakness or numbness of an extremity below the injection site  Hives or difficulty breathing (go to the emergency room)  Inflammation or drainage at the infection site  Severe back/neck pain  Any new symptoms which are concerning to you  Please note:  Although the local anesthetic injected can often make your back or neck feel good for several hours after the injection, the pain will likely return.  It takes 3-7 days for steroids to work in the epidural space.  You may not notice any pain relief for at least that one week.  If effective, we will often do a series of three injections spaced 3-6 weeks apart to maximally decrease your pain.  After the initial series, we generally will wait several months before considering a repeat injection of the same type.  If you have any questions, please call [phone] [facility]  PRN PROCEDURES: CERVICAL EPIDURAL STEROID INJECTION FOR NECK AND UPPER EXTREMITY PAIN BILATERAL LUMBAR FACET BLOCK FOR LOW BACK PAIN LUMBAR EPIDURAL  STEROID INJECTION FOR LOWER EXTREMITY PAIN

## 2015-08-07 ENCOUNTER — Encounter: Payer: Self-pay | Admitting: Pain Medicine

## 2015-08-15 LAB — TOXASSURE SELECT 13 (MW), URINE: PDF: 0

## 2015-08-21 ENCOUNTER — Encounter: Payer: Self-pay | Admitting: Pain Medicine

## 2015-08-27 ENCOUNTER — Other Ambulatory Visit: Payer: Self-pay | Admitting: Family Medicine

## 2015-08-28 NOTE — Telephone Encounter (Signed)
Pt called to ck on status of refills for metformin and spironolactone; advised pt refills already done; pt voiced understanding.

## 2015-08-29 ENCOUNTER — Other Ambulatory Visit: Payer: Self-pay | Admitting: Family Medicine

## 2015-09-01 NOTE — Telephone Encounter (Signed)
Last f/u 04/2014 

## 2015-09-02 ENCOUNTER — Ambulatory Visit: Payer: Self-pay | Admitting: Family Medicine

## 2015-09-18 ENCOUNTER — Encounter: Payer: Self-pay | Admitting: Family Medicine

## 2015-09-18 ENCOUNTER — Ambulatory Visit (INDEPENDENT_AMBULATORY_CARE_PROVIDER_SITE_OTHER): Payer: Managed Care, Other (non HMO) | Admitting: Family Medicine

## 2015-09-18 VITALS — BP 134/80 | HR 98 | Temp 98.3°F | Wt 250.0 lb

## 2015-09-18 DIAGNOSIS — F419 Anxiety disorder, unspecified: Secondary | ICD-10-CM | POA: Diagnosis not present

## 2015-09-18 DIAGNOSIS — I10 Essential (primary) hypertension: Secondary | ICD-10-CM | POA: Diagnosis not present

## 2015-09-18 DIAGNOSIS — E282 Polycystic ovarian syndrome: Secondary | ICD-10-CM

## 2015-09-18 DIAGNOSIS — J309 Allergic rhinitis, unspecified: Secondary | ICD-10-CM | POA: Diagnosis not present

## 2015-09-18 DIAGNOSIS — E119 Type 2 diabetes mellitus without complications: Secondary | ICD-10-CM

## 2015-09-18 MED ORDER — SPIRONOLACTONE 25 MG PO TABS: 25.0000 mg | ORAL_TABLET | Freq: Every day | ORAL | 3 refills | Status: DC

## 2015-09-18 MED ORDER — ALBUTEROL SULFATE HFA 108 (90 BASE) MCG/ACT IN AERS: 2.0000 | INHALATION_SPRAY | Freq: Four times a day (QID) | RESPIRATORY_TRACT | 5 refills | Status: DC | PRN

## 2015-09-18 MED ORDER — METFORMIN HCL 500 MG PO TABS: ORAL_TABLET | ORAL | 2 refills | Status: DC

## 2015-09-18 MED ORDER — FLUTICASONE PROPIONATE 50 MCG/ACT NA SUSP: 2.0000 | Freq: Every day | NASAL | 1 refills | Status: DC | PRN

## 2015-09-18 MED ORDER — OMEPRAZOLE 20 MG PO CPDR: 20.0000 mg | DELAYED_RELEASE_CAPSULE | Freq: Every day | ORAL | 3 refills | Status: DC

## 2015-09-18 MED ORDER — MONTELUKAST SODIUM 10 MG PO TABS: 10.0000 mg | ORAL_TABLET | Freq: Every day | ORAL | 3 refills | Status: DC

## 2015-09-18 NOTE — Assessment & Plan Note (Signed)
Feels symptoms under control with rxs and psychotherapy.

## 2015-09-18 NOTE — Progress Notes (Deleted)
   Subjective:   Patient ID: Debra Myers, female    DOB: Jun 27, 1965, 50 y.o.   MRN: [id number]  Debra Myers is a pleasant 50 y.o. year old female who presents to clinic today with Follow-up and Medication Refill  on 09/18/2015  HPI: ***  Review of Systems     Objective:    BP 134/80   Pulse 98   Temp 98.3 F (36.8 C) (Oral)   Wt 250 lb (113.4 kg)   SpO2 96%   BMI 42.25 kg/m    Physical Exam        Assessment & Plan:   No diagnosis found. No Follow-up on file.

## 2015-09-18 NOTE — Assessment & Plan Note (Signed)
Well controlled on current rx. No changes made today. 

## 2015-09-18 NOTE — Assessment & Plan Note (Signed)
Refill flonase today.

## 2015-09-18 NOTE — Assessment & Plan Note (Signed)
A1c at goal. No changes made to rxs today. LDL at goal.

## 2015-09-18 NOTE — Progress Notes (Signed)
Pre visit review using our clinic review tool, if applicable. No additional management support is needed unless otherwise documented below in the visit note. 

## 2015-09-18 NOTE — Progress Notes (Signed)
Subjective:   Patient ID: Debra Myers, female    DOB: [birth year], 50 y.o.   MRN: [id number]  Debra Myers is a pleasant 50 y.o. year old female who presents to clinic today for med refills/ Follow-up and Medication Refill  on 09/18/2015  HPI:  Chronic pain- followed by Dr. Laban Emperor at pain clinic.  Was last seen on 11/28/14.  Note reviewed.   DM- does not check FSBS regularly.  Neg urine micro 04/2014. LDL has been at goal. Lab Results  Component Value Date   HGBA1C 5.9 06/12/2015   Lab Results  Component Value Date   CHOL 160 06/12/2015   HDL 43 06/12/2015   LDLCALC 87 06/12/2015   TRIG 148 06/12/2015   CHOLHDL 3.7 06/12/2015     Lab Results  Component Value Date   HGBA1C 5.9 06/12/2015    Lab Results  Component Value Date   CHOL 160 06/12/2015   HDL 43 06/12/2015   LDLCALC 87 06/12/2015   TRIG 148 06/12/2015   CHOLHDL 3.7 06/12/2015    HTN- on Aldactone 20 mg daily.  Denies any HA, blurred vision, CP or SOB.  Lab Results  Component Value Date   CREATININE 0.80 06/12/2015   Anxiety- psychiatrist does manage her rx and she feels they are working well.  Allergic rhinitis- flonase is effective this season.   Patient Active Problem List   Diagnosis Date Noted  . Muscle spasm, nocturnal 08/05/2015  . Musculoskeletal pain 08/05/2015  . Carpal tunnel syndrome (Bilateral) (L>R) 08/05/2015  . Cervical facet syndrome (Location of Tertiary source of pain) (Bilateral) (L>R) 08/05/2015  . TIA (transient ischemic attack) 06/12/2015  . Cervical spondylosis (C5-6 & C6-7 DDD) 03/04/2015  . Cervical (3 mm) Grade 1 Anterolisthesis of C4 over C5 03/04/2015  . Chronic neck pain (Location of Tertiary source of pain) (Bilateral) (L>R) 03/04/2015  . Chronic cervical radicular pain (Bilateral) (L>R) 03/04/2015  . Cervical foraminal stenosis (multilevel) (Bilateral) 03/04/2015  . Chronic pain 02/26/2015  . Long term current use of opiate analgesic 02/26/2015  . Encounter for  therapeutic drug level monitoring 02/26/2015  . Encounter for chronic pain management 02/26/2015  . Numbness of upper extremity 02/26/2015  . Radiculitis involving upper extremity 02/26/2015  . Chronic low back pain (Location of Primary Source of Pain) (Bilateral) (L>R) 02/26/2015  . Lumbar spondylosis (Bulging Disc & Severe Left Foraminal Stenosis at L3-4) 02/26/2015  . Chronic lower extremity pain (Location of Secondary source of pain) (Left) 02/26/2015  . Lumbar foraminal stenosis (Severe Left L3-4) 02/26/2015  . Lumbar facet syndrome (Location of Primary Source of Pain) (Bilateral) (L>R) 02/26/2015  . Opiate use (10 MME/Day) 12/02/2014  . Long term prescription opiate use 12/02/2014  . IUD (intrauterine device) in place 10/01/2014  . GERD (gastroesophageal reflux disease) 01/30/2014  . Endometrial polyp 08/03/2013  . Overweight(278.02) 02/14/2012  . Hypertension 07/09/2011  . HYPERTRIGLYCERIDEMIA 12/27/2008  . GOITER, NONTOXIC MULTINODULAR 12/14/2006  . Diabetes (HCC) 12/14/2006  . POLYCYSTIC OVARIES 12/14/2006  . Metabolic Syndrome X 12/14/2006  . OBESITY 12/14/2006  . Anxiety 12/14/2006  . Allergic rhinitis 12/14/2006  . Asthma 12/14/2006  . HIRSUTISM 12/14/2006  . Generalized anxiety disorder 12/14/2006   Past Medical History:  Diagnosis Date  . Anxiety   . Asthma    ALLERGY INDUCED  . Bulging lumbar disc (L3-4) 02/26/2015  . Degenerative disc disease   . Degenerative lumbar disc   . Depression   . Diabetes mellitus    TYPE 2  .  Elective abortion    ONE  . GERD (gastroesophageal reflux disease)    takes prilosec   . Hypertension   . Vaginal delivery    ONE NSVD   Past Surgical History:  Procedure Laterality Date  . CHOLECYSTECTOMY  2001  . DILATATION & CURETTAGE/HYSTEROSCOPY WITH MYOSURE N/A 09/10/2014   Procedure: DILATATION & CURETTAGE/HYSTEROSCOPY WITH MYOSURE/INSERTION OF IUD;  Surgeon: Ok Edwards, MD;  Location: [facility];  Service: Gynecology;   Laterality: N/A;  . Lapband  04/2012  . WISDOM TOOTH EXTRACTION     Social History  Substance Use Topics  . Smoking status: Never Smoker  . Smokeless tobacco: Never Used  . Alcohol use No     Comment: rarely   Family History  Problem Relation Age of Onset  . Hypertension Father   . Diabetes Father   . Asthma Maternal Grandmother   . Stroke Maternal Grandmother   . Hypertension Mother   . Stroke Maternal Grandfather    Allergies  Allergen Reactions  . Oxycodone Itching  . Penicillins Other (See Comments)    Reaction:  GI upset  Has patient had a PCN reaction causing immediate rash, facial/tongue/throat swelling, SOB or lightheadedness with hypotension: No Has patient had a PCN reaction causing severe rash involving mucus membranes or skin necrosis: No Has patient had a PCN reaction that required hospitalization No Has patient had a PCN reaction occurring within the last 10 years: No If all of the above answers are "NO", then may proceed with Cephalosporin use.  . Sulfa Antibiotics Itching and Rash   Current Outpatient Prescriptions on File Prior to Visit  Medication Sig Dispense Refill  . aspirin EC 81 MG tablet Take 1 tablet (81 mg total) by mouth daily. 30 tablet 2  . atorvastatin (LIPITOR) 10 MG tablet Take 1 tablet (10 mg total) by mouth daily. 30 tablet 3  . buPROPion (WELLBUTRIN XL) 300 MG 24 hr tablet Take 300 mg by mouth daily.    . cetirizine (ZYRTEC) 10 MG tablet Take 10 mg by mouth at bedtime.    Marland Kitchen HYDROcodone-acetaminophen (NORCO/VICODIN) 5-325 MG tablet Take 0.5-1 tablets by mouth 2 (two) times daily as needed for severe pain. 60 tablet 0  . ibuprofen (ADVIL,MOTRIN) 200 MG tablet Take 800 mg by mouth every 4 (four) hours as needed for headache or mild pain.     Marland Kitchen lamoTRIgine (LAMICTAL) 200 MG tablet Take 300 mg by mouth at bedtime.     Marland Kitchen LORazepam (ATIVAN) 1 MG tablet Take 0.5-1 mg by mouth every 8 (eight) hours as needed for anxiety or sleep.     . metaxalone  (SKELAXIN) 800 MG tablet Take 1 tablet (800 mg total) by mouth 3 (three) times daily as needed for muscle spasms. 90 tablet 2  . PARoxetine (PAXIL-CR) 25 MG 24 hr tablet Take 50 mg by mouth at bedtime.      No current facility-administered medications on file prior to visit.    The PMH, PSH, Social History, Family History, Medications, and allergies have been reviewed in [facility], and have been updated if relevant.  Review of Systems  Constitutional: Negative.   HENT: Negative.   Eyes: Negative.   Respiratory: Negative.   Cardiovascular: Negative.   Gastrointestinal: Negative.   Musculoskeletal: Negative.   Skin: Negative.   Psychiatric/Behavioral: The patient is nervous/anxious.       See HPI  Objective:    BP 134/80   Pulse 98   Temp 98.3 F (36.8 C) (Oral)  Wt 250 lb (113.4 kg)   SpO2 96%   BMI 42.25 kg/m    Physical Exam  Constitutional: She is oriented to person, place, and time. She appears well-developed and well-nourished. No distress.  HENT:  Head: Normocephalic.  Eyes: Conjunctivae are normal.  Cardiovascular: Normal rate and regular rhythm.   Pulmonary/Chest: Effort normal.  Musculoskeletal: Normal range of motion. She exhibits no edema.  Neurological: She is alert and oriented to person, place, and time. No cranial nerve deficit.  Skin: Skin is warm and dry.  Psychiatric: She has a normal mood and affect. Her behavior is normal. Judgment and thought content normal.  Nursing note and vitals reviewed.         Assessment & Plan:   Allergic rhinitis, unspecified allergic rhinitis type  Anxiety  Polycystic ovaries  Essential hypertension No Follow-up on file.

## 2015-10-13 ENCOUNTER — Telehealth: Payer: Self-pay

## 2015-10-13 NOTE — Telephone Encounter (Signed)
-----   Message from Richarda OverlieJada A Romonia Yanik, [place] sent at 07/11/2015  9:03 AM EDT ----- Lipid fasting, order placed

## 2015-10-14 ENCOUNTER — Ambulatory Visit: Payer: Self-pay | Admitting: Neurology

## 2015-10-20 ENCOUNTER — Telehealth: Payer: Self-pay

## 2015-10-20 NOTE — Telephone Encounter (Signed)
Patient called and left vm to r/s appt. I called her back and she r/s to Sept. 14

## 2015-10-21 ENCOUNTER — Encounter: Payer: Self-pay | Admitting: Pain Medicine

## 2015-10-31 ENCOUNTER — Encounter: Payer: Self-pay | Admitting: Physician Assistant

## 2015-10-31 ENCOUNTER — Ambulatory Visit: Payer: Self-pay | Admitting: Physician Assistant

## 2015-10-31 VITALS — BP 149/83 | HR 89 | Temp 98.3°F

## 2015-10-31 DIAGNOSIS — Z299 Encounter for prophylactic measures, unspecified: Secondary | ICD-10-CM

## 2015-10-31 DIAGNOSIS — K219 Gastro-esophageal reflux disease without esophagitis: Secondary | ICD-10-CM

## 2015-10-31 MED ORDER — PANTOPRAZOLE SODIUM 40 MG PO TBEC: 40.0000 mg | DELAYED_RELEASE_TABLET | Freq: Every day | ORAL | 3 refills | Status: DC

## 2015-10-31 NOTE — Addendum Note (Signed)
Addended by: Catha BrowEACON, MONIQUE T on: 10/31/2015 10:42 AM   Modules accepted: Orders

## 2015-10-31 NOTE — Progress Notes (Signed)
S: states doesn't think her prilosec is working, having to take zantac in between, states reflux has gotten worse so is having to sleep in the recliner, has gained some weight and is drinking more caffeine than previously  O: vitals wnl, nad, lungs c ta , cv rrr, abd soft nontender bs normal, n/v intact  A: gerd  P: protonix 40mg  qd

## 2015-11-01 LAB — CMP12+LP+TP+TSH+6AC+CBC/D/PLT
ALBUMIN: 4.4 g/dL (ref 3.5–5.5)
ALT: 16 IU/L (ref 0–32)
AST: 16 IU/L (ref 0–40)
Albumin/Globulin Ratio: 1.8 (ref 1.2–2.2)
Alkaline Phosphatase: 125 IU/L — ABNORMAL HIGH (ref 39–117)
BASOS ABS: 0 10*3/uL (ref 0.0–0.2)
BUN / CREAT RATIO: 14 (ref 9–23)
BUN: 12 mg/dL (ref 6–24)
Basos: 1 %
Bilirubin Total: 0.4 mg/dL (ref 0.0–1.2)
CALCIUM: 9.1 mg/dL (ref 8.7–10.2)
CHLORIDE: 99 mmol/L (ref 96–106)
CHOLESTEROL TOTAL: 130 mg/dL (ref 100–199)
Chol/HDL Ratio: 2.5 ratio units (ref 0.0–4.4)
Creatinine, Ser: 0.86 mg/dL (ref 0.57–1.00)
EOS (ABSOLUTE): 0.2 10*3/uL (ref 0.0–0.4)
EOS: 2 %
Estimated CHD Risk: <0.5 times avg. (ref 0.0–1.0)
FREE THYROXINE INDEX: 1.4 (ref 1.2–4.9)
GFR calc non Af Amer: 80 mL/min/{1.73_m2} (ref >59)
GFR, EST AFRICAN AMERICAN: 92 mL/min/{1.73_m2} (ref >59)
GGT: 19 IU/L (ref 0–60)
GLOBULIN, TOTAL: 2.5 g/dL (ref 1.5–4.5)
Glucose: 99 mg/dL (ref 65–99)
HDL: 53 mg/dL (ref >39)
HEMOGLOBIN: 12 g/dL (ref 11.1–15.9)
Hematocrit: 36 % (ref 34.0–46.6)
IMMATURE GRANULOCYTES: 0 %
IRON: 46 ug/dL (ref 27–159)
Immature Grans (Abs): 0 10*3/uL (ref 0.0–0.1)
LDH: 203 IU/L (ref 119–226)
LDL Calculated: 59 mg/dL (ref 0–99)
Lymphocytes Absolute: 2 10*3/uL (ref 0.7–3.1)
Lymphs: 23 %
MCH: 27.1 pg (ref 26.6–33.0)
MCHC: 33.3 g/dL (ref 31.5–35.7)
MCV: 81 fL (ref 79–97)
MONOCYTES: 4 %
Monocytes Absolute: 0.4 10*3/uL (ref 0.1–0.9)
NEUTROS PCT: 70 %
Neutrophils Absolute: 6 10*3/uL (ref 1.4–7.0)
PHOSPHORUS: 4 mg/dL (ref 2.5–4.5)
PLATELETS: 259 10*3/uL (ref 150–379)
Potassium: 4.9 mmol/L (ref 3.5–5.2)
RBC: 4.42 x10E6/uL (ref 3.77–5.28)
RDW: 15.2 % (ref 12.3–15.4)
Sodium: 139 mmol/L (ref 134–144)
T3 UPTAKE RATIO: 24 % (ref 24–39)
T4, Total: 5.8 ug/dL (ref 4.5–12.0)
TOTAL PROTEIN: 6.9 g/dL (ref 6.0–8.5)
TRIGLYCERIDES: 89 mg/dL (ref 0–149)
TSH: 0.939 u[IU]/mL (ref 0.450–4.500)
Uric Acid: 4 mg/dL (ref 2.5–7.1)
VLDL CHOLESTEROL CAL: 18 mg/dL (ref 5–40)
WBC: 8.7 10*3/uL (ref 3.4–10.8)

## 2015-11-06 ENCOUNTER — Encounter: Payer: Self-pay | Admitting: Pain Medicine

## 2015-11-07 ENCOUNTER — Ambulatory Visit: Payer: Managed Care, Other (non HMO) | Admitting: Gynecology

## 2015-11-07 ENCOUNTER — Other Ambulatory Visit: Payer: Managed Care, Other (non HMO)

## 2015-11-10 ENCOUNTER — Encounter: Payer: Self-pay | Admitting: Pain Medicine

## 2015-11-10 ENCOUNTER — Ambulatory Visit: Payer: Managed Care, Other (non HMO) | Attending: Pain Medicine | Admitting: Pain Medicine

## 2015-11-10 VITALS — BP 126/68 | HR 86 | Temp 98.2°F | Resp 16 | Ht 64.0 in | Wt 260.0 lb

## 2015-11-10 DIAGNOSIS — M7918 Myalgia, other site: Secondary | ICD-10-CM

## 2015-11-10 DIAGNOSIS — Z79891 Long term (current) use of opiate analgesic: Secondary | ICD-10-CM | POA: Insufficient documentation

## 2015-11-10 DIAGNOSIS — G8929 Other chronic pain: Secondary | ICD-10-CM | POA: Diagnosis not present

## 2015-11-10 DIAGNOSIS — M50121 Cervical disc disorder at C4-C5 level with radiculopathy: Secondary | ICD-10-CM | POA: Diagnosis not present

## 2015-11-10 DIAGNOSIS — E785 Hyperlipidemia, unspecified: Secondary | ICD-10-CM | POA: Diagnosis not present

## 2015-11-10 DIAGNOSIS — Z7984 Long term (current) use of oral hypoglycemic drugs: Secondary | ICD-10-CM | POA: Insufficient documentation

## 2015-11-10 DIAGNOSIS — M50123 Cervical disc disorder at C6-C7 level with radiculopathy: Secondary | ICD-10-CM | POA: Diagnosis not present

## 2015-11-10 DIAGNOSIS — M62838 Other muscle spasm: Secondary | ICD-10-CM | POA: Insufficient documentation

## 2015-11-10 DIAGNOSIS — E669 Obesity, unspecified: Secondary | ICD-10-CM | POA: Diagnosis not present

## 2015-11-10 DIAGNOSIS — Z6841 Body Mass Index (BMI) 40.0 and over, adult: Secondary | ICD-10-CM | POA: Insufficient documentation

## 2015-11-10 DIAGNOSIS — M791 Myalgia: Secondary | ICD-10-CM | POA: Diagnosis not present

## 2015-11-10 DIAGNOSIS — M47816 Spondylosis without myelopathy or radiculopathy, lumbar region: Secondary | ICD-10-CM

## 2015-11-10 DIAGNOSIS — I6529 Occlusion and stenosis of unspecified carotid artery: Secondary | ICD-10-CM | POA: Diagnosis not present

## 2015-11-10 DIAGNOSIS — M79605 Pain in left leg: Secondary | ICD-10-CM

## 2015-11-10 DIAGNOSIS — F1190 Opioid use, unspecified, uncomplicated: Secondary | ICD-10-CM

## 2015-11-10 DIAGNOSIS — G459 Transient cerebral ischemic attack, unspecified: Secondary | ICD-10-CM | POA: Diagnosis not present

## 2015-11-10 DIAGNOSIS — E119 Type 2 diabetes mellitus without complications: Secondary | ICD-10-CM | POA: Diagnosis not present

## 2015-11-10 DIAGNOSIS — M4806 Spinal stenosis, lumbar region: Secondary | ICD-10-CM | POA: Diagnosis not present

## 2015-11-10 DIAGNOSIS — M50122 Cervical disc disorder at C5-C6 level with radiculopathy: Secondary | ICD-10-CM | POA: Insufficient documentation

## 2015-11-10 DIAGNOSIS — M545 Low back pain: Secondary | ICD-10-CM

## 2015-11-10 DIAGNOSIS — K219 Gastro-esophageal reflux disease without esophagitis: Secondary | ICD-10-CM | POA: Diagnosis not present

## 2015-11-10 DIAGNOSIS — M6249 Contracture of muscle, multiple sites: Secondary | ICD-10-CM

## 2015-11-10 DIAGNOSIS — G5603 Carpal tunnel syndrome, bilateral upper limbs: Secondary | ICD-10-CM | POA: Insufficient documentation

## 2015-11-10 MED ORDER — HYDROCODONE-ACETAMINOPHEN 5-325 MG PO TABS: 0.5000 | ORAL_TABLET | Freq: Two times a day (BID) | ORAL | 0 refills | Status: DC | PRN

## 2015-11-10 MED ORDER — METAXALONE 800 MG PO TABS: 800.0000 mg | ORAL_TABLET | Freq: Three times a day (TID) | ORAL | 2 refills | Status: DC | PRN

## 2015-11-10 NOTE — Progress Notes (Addendum)
Patient here for medication management.    Metaxalone 38/90  Last fill 10/10/15 Hydrocodone 5-325 mg 28/60 last fill 09/07/217  Safety precautions to be maintained throughout the outpatient stay will include: orient to surroundings, keep bed in low position, maintain call bell within reach at all times, provide assistance with transfer out of bed and ambulation.

## 2015-11-10 NOTE — Progress Notes (Signed)
Patient's Name: Debra Myers  MRN: [id number]  Referring Provider: Dianne Dun, MD  DOB: [birth year]  PCP: Ruthe Mannan, MD  DOS: 11/10/2015  Note by: Sydnee Levans. Laban Emperor, MD  Service setting: Ambulatory outpatient  Specialty: Interventional Pain Management  Location: [facility] (AMB) Pain Management Facility    Patient type: Established   Primary Reason(s) for Visit: Encounter for prescription drug management (Level of risk: moderate) CC: Back Pain (lower left )  HPI  Debra Myers is a 50 y.o. year old, female patient, who returns today as an established patient. She has GOITER, NONTOXIC MULTINODULAR; Diabetes (HCC); POLYCYSTIC OVARIES; HYPERTRIGLYCERIDEMIA; Metabolic Syndrome X; OBESITY; Anxiety; Allergic rhinitis; Asthma; HIRSUTISM; Generalized anxiety disorder; Hypertension; Overweight(278.02); Endometrial polyp; GERD (gastroesophageal reflux disease); IUD (intrauterine device) in place; Opiate use (10 MME/Day); Long term prescription opiate use; Chronic pain; Long term current use of opiate analgesic; Encounter for therapeutic drug level monitoring; Encounter for chronic pain management; Numbness of upper extremity; Radiculitis involving upper extremity; Chronic low back pain (Location of Primary Source of Pain) (Bilateral) (L>R); Lumbar spondylosis (Bulging Disc & Severe Left Foraminal Stenosis at L3-4); Chronic lower extremity pain (Location of Secondary source of pain) (Left); Lumbar foraminal stenosis (Severe Left L3-4); Lumbar facet syndrome (Location of Primary Source of Pain) (Bilateral) (L>R); Cervical spondylosis (C5-6 & C6-7 DDD); Cervical (3 mm) Grade 1 Anterolisthesis of C4 over C5; Chronic neck pain (Location of Tertiary source of pain) (Bilateral) (L>R); Chronic cervical radicular pain (Bilateral) (L>R); Cervical foraminal stenosis (multilevel) (Bilateral); TIA (transient ischemic attack); Muscle spasm, nocturnal; Musculoskeletal pain; Carpal tunnel syndrome (Bilateral) (L>R); and Cervical facet  syndrome (Location of Tertiary source of pain) (Bilateral) (L>R) on her problem list.. Her primarily concern today is the Back Pain (lower left )  Pain Assessment: Self-Reported Pain Score: 2              Reported level is compatible with observation.       Pain Type: Chronic pain Pain Location: Back Pain Orientation: Lower, Left Pain Descriptors / Indicators: Aching, Constant Pain Frequency: Constant  The patient comes into the clinics today for pharmacological management of her chronic pain. I last saw this patient on 08/05/2015. The patient  reports that she does not use drugs. Her body mass index is 44.63 kg/m.  Date of Last Visit: 08/05/15 Service Provided on Last Visit: Med Refill  Controlled Substance Pharmacotherapy Assessment & REMS (Risk Evaluation and Mitigation Strategy)  Analgesic: Hydrocodone/APAP 5/325 2 tablets per day (10 mg/day) MME/day: 10 mg/day  Pill Count: Hydrocodone 5-325 mg 28/60 last fill 09/07/217. Pharmacokinetics: Onset of action (Liberation/Absorption): Within expected pharmacological parameters Time to Peak effect (Distribution): Timing and results are as within normal expected parameters Duration of action (Metabolism/Excretion): Within normal limits for medication Pharmacodynamics: Analgesic Effect: More than 50% Activity Facilitation: Medication(s) allow patient to sit, stand, walk, and do the basic ADLs Perceived Effectiveness: Described as relatively effective, allowing for increase in activities of daily living (ADL) Side-effects or Adverse reactions: None reported Monitoring: [place] PMP: Online review of the past 91-month period conducted. Compliant with practice rules and regulations Last UDS on record: [employer]ox[employer] Select 13  Date Value Ref Range Status  08/05/2015 FINAL  Final    Comment:    ==================================================================== TOXASSURE SELECT 13  (MW) ==================================================================== Test                             Result       Flag  Units Drug Present and Declared for Prescription Verification   Lorazepam                      925          EXPECTED   ng/mg creat    Source of lorazepam is a scheduled prescription medication.   Hydrocodone                    433          EXPECTED   ng/mg creat   Norhydrocodone                 871          EXPECTED   ng/mg creat    Sources of hydrocodone include scheduled prescription    medications. Norhydrocodone is an expected metabolite of    hydrocodone. ==================================================================== Test                      Result    Flag   Units      Ref Range   Creatinine              52               mg/dL      >=16 ==================================================================== Declared Medications:  The flagging and interpretation on this report are based on the  following declared medications.  Unexpected results may arise from  inaccuracies in the declared medications.  **Note: The testing scope of this panel includes these medications:  Hydrocodone (Hydrocodone-Acetaminophen)  Lorazepam (Ativan)  **Note: The testing scope of this panel does not include following  reported medications:  Acetaminophen (Hydrocodone-Acetaminophen)  Albuterol  Aspirin  Atorvastatin  Bupropion (Wellbutrin)  Cetirizine  Fluticasone (Flonase)  Ibuprofen  Lamotrigine  Metaxalone (Skelaxin)  Metformin  Montelukast (Singulair)  Omeprazole  Paroxetine  Spironolactone (Aldactone) ==================================================================== For clinical consultation, please call [phone]. ====================================================================    UDS interpretation: Compliant          Medication Assessment Form: Reviewed. Patient indicates being compliant with therapy Treatment compliance:  Compliant Risk Assessment: Aberrant Behavior: None observed today Substance Use Disorder (SUD) Risk Level: Low-to-moderate Risk of opioid abuse or dependence: 0.7-3.0% with doses ? 36 MME/day and 6.1-26% with doses ? 120 MME/day. Opioid Risk Tool (ORT) Score: Total Score: 6 Moderate Risk for SUD (Score between 4-7) Depression Scale Score: PHQ-2: PHQ-2 Total Score: 0 No depression (0) PHQ-9: PHQ-9 Total Score: 0 No depression (0-4)  Pharmacologic Plan: No change in therapy, at this time  Laboratory Chemistry  Inflammation Markers No results found for: ESRSEDRATE, CRP Renal Function Lab Results  Component Value Date   BUN 12 10/31/2015   CREATININE 0.86 10/31/2015   GFRAA 92 10/31/2015   GFRNONAA 80 10/31/2015   Hepatic Function Lab Results  Component Value Date   AST 16 10/31/2015   ALT 16 10/31/2015   ALBUMIN 4.4 10/31/2015   Electrolytes Lab Results  Component Value Date   NA 139 10/31/2015   K 4.9 10/31/2015   CL 99 10/31/2015   CALCIUM 9.1 10/31/2015   Pain Modulating Vitamins Lab Results  Component Value Date   VD25OH 44 05/18/2013   Coagulation Parameters Lab Results  Component Value Date   INR 1.10 06/11/2015   LABPROT 14.4 06/11/2015   APTT 33 06/11/2015   PLT 259 10/31/2015   Cardiovascular Lab Results  Component Value Date   HGB 12.0 06/12/2015   HCT 36.0  10/31/2015   Note: Lab results reviewed.  Recent Diagnostic Imaging  US Carotid Bilateral  Result Date: 07/18/2015 CLINICAL DATA:  TIA. History of hypertension, hyperlipidemia and diabetes. EXAM: BILATERAL CAROTID DUPLEX ULTRASOUND TECHNIQUE: Wallace Cullens scale imaging, color Doppler and duplex ultrasound were performed of bilateral carotid and vertebral arteries in the neck. COMPARISON:  Brain MRI - 06/12/2015 FINDINGS: Criteria: Quantification of carotid stenosis is based on velocity parameters that correlate the residual internal carotid diameter with NASCET-based stenosis levels, using the diameter  of the distal internal carotid lumen as the denominator for stenosis measurement. The following velocity measurements were obtained: RIGHT ICA:  86/33 cm/sec CCA:  83/13 cm/sec SYSTOLIC ICA/CCA RATIO:  1.0 DIASTOLIC ICA/CCA RATIO:  2.5 ECA:  101 cm/sec LEFT ICA:  73/31 cm/sec CCA:  109/16 cm/sec SYSTOLIC ICA/CCA RATIO:  0.7 DIASTOLIC ICA/CCA RATIO:  1.9 ECA:  104 cm/sec RIGHT CAROTID ARTERY: There is no grayscale evidence of significant intimal thickening or atherosclerotic plaque affecting the interrogated portions of the right carotid system. There are no elevated peak systolic velocities within the interrogated course the right internal carotid artery to suggest a hemodynamically significant stenosis. RIGHT VERTEBRAL ARTERY:  Antegrade Flow LEFT CAROTID ARTERY: There is no grayscale evidence of significant intimal thickening or atherosclerotic plaque affecting the interrogated portions of the left carotid system. There are no elevated peak systolic velocities within the interrogated course of the left internal carotid artery to suggest a hemodynamically significant stenosis. LEFT VERTEBRAL ARTERY:  Antegrade Flow IMPRESSION: Unremarkable carotid Doppler ultrasound. Electronically Signed   By: Simonne Come M.D.   On: 07/18/2015 17:33    Meds  The patient has a current medication list which includes the following prescription(s): aspirin ec, atorvastatin, bupropion, cetirizine, fluticasone, hydrocodone-acetaminophen, hydrocodone-acetaminophen, hydrocodone-acetaminophen, ibuprofen, lamotrigine, lorazepam, metaxalone, metformin, montelukast, pantoprazole, paroxetine, spironolactone, and vyvanse.  Current Outpatient Prescriptions on File Prior to Visit  Medication Sig  . aspirin EC 81 MG tablet Take 1 tablet (81 mg total) by mouth daily.  Marland Kitchen atorvastatin (LIPITOR) 10 MG tablet Take 1 tablet (10 mg total) by mouth daily.  Marland Kitchen buPROPion (WELLBUTRIN XL) 300 MG 24 hr tablet Take 300 mg by mouth daily.  . cetirizine  (ZYRTEC) 10 MG tablet Take 10 mg by mouth at bedtime.  . fluticasone (FLONASE) 50 MCG/ACT nasal spray Place 2 sprays into both nostrils daily as needed for rhinitis.  Marland Kitchen ibuprofen (ADVIL,MOTRIN) 200 MG tablet Take 800 mg by mouth every 4 (four) hours as needed for headache or mild pain.   Marland Kitchen lamoTRIgine (LAMICTAL) 200 MG tablet Take 300 mg by mouth at bedtime.   Marland Kitchen LORazepam (ATIVAN) 1 MG tablet Take 0.5-1 mg by mouth every 8 (eight) hours as needed for anxiety or sleep.   . metFORMIN (GLUCOPHAGE) 500 MG tablet take 1 tablet by mouth once daily with BREAKFAST  . montelukast (SINGULAIR) 10 MG tablet Take 1 tablet (10 mg total) by mouth at bedtime.  . pantoprazole (PROTONIX) 40 MG tablet Take 1 tablet (40 mg total) by mouth daily.  Marland Kitchen PARoxetine (PAXIL-CR) 25 MG 24 hr tablet Take 50 mg by mouth at bedtime.   Marland Kitchen spironolactone (ALDACTONE) 25 MG tablet Take 1 tablet (25 mg total) by mouth daily.  Marland Kitchen VYVANSE 30 MG capsule Take 30 mg by mouth daily.   No current facility-administered medications on file prior to visit.     ROS  Constitutional: Denies any fever or chills Gastrointestinal: No reported hemesis, hematochezia, vomiting, or acute GI distress Musculoskeletal: Denies any acute onset joint  swelling, redness, loss of ROM, or weakness Neurological: No reported episodes of acute onset apraxia, aphasia, dysarthria, agnosia, amnesia, paralysis, loss of coordination, or loss of consciousness  Allergies  Ms. Baratta is allergic to oxycodone; penicillins; and sulfa antibiotics.  PFSH  Medical:  Ms. Saur  has a past medical history of Anxiety; Asthma; Bulging lumbar disc (L3-4) (02/26/2015); Degenerative disc disease; Degenerative lumbar disc; Depression; Diabetes mellitus; Elective abortion; GERD (gastroesophageal reflux disease); Hypertension; Stroke [facility]) (06/11/2015); and Vaginal delivery. Family: family history includes Asthma in her maternal grandmother; Diabetes in her father; Hypertension in her  father and mother; Stroke in her maternal grandfather and maternal grandmother. Surgical:  has a past surgical history that includes Cholecystectomy (2001); Lapband (04/2012); Wisdom tooth extraction; and Dilatation & curettage/hysteroscopy with myosure (N/A, 09/10/2014). Tobacco:  reports that she has never smoked. She has never used smokeless tobacco. Alcohol:  reports that she does not drink alcohol. Drug:  reports that she does not use drugs.  Constitutional Exam  General appearance: Well nourished, well developed, and well hydrated. In no acute distress Vitals:   11/10/15 0849  BP: 126/68  Pulse: 86  Resp: 16  Temp: 98.2 F (36.8 C)  TempSrc: Oral  SpO2: 99%  Weight: 260 lb (117.9 kg)  Height: 5\' 4"  (1.626 m)  BMI Assessment: Estimated body mass index is 44.63 kg/m as calculated from the following:   Height as of this encounter: 5\' 4"  (1.626 m).   Weight as of this encounter: 260 lb (117.9 kg).   BMI interpretation: (>40 kg/m2) = Extreme obesity (Class III): This range is associated with a 254% higher incidence of chronic pain. BMI Readings from Last 4 Encounters:  11/10/15 44.63 kg/m  09/18/15 42.25 kg/m  08/05/15 42.59 kg/m  07/11/15 42.74 kg/m   Wt Readings from Last 4 Encounters:  11/10/15 260 lb (117.9 kg)  09/18/15 250 lb (113.4 kg)  08/05/15 252 lb (114.3 kg)  07/11/15 249 lb (112.9 kg)  Psych/Mental status: Alert and oriented x 3 (person, place, & time) Eyes: PERLA Respiratory: No evidence of acute respiratory distress  Cervical Spine Exam  Inspection: No masses, redness, or swelling Alignment: Symmetrical Functional ROM: ROM appears unrestricted Stability: No instability detected Muscle strength & Tone: Functionally intact Sensory: Unimpaired Palpation: Non-contributory  Upper Extremity (UE) Exam    Side: Right upper extremity  Side: Left upper extremity  Inspection: No masses, redness, swelling, or asymmetry  Inspection: No masses, redness, swelling,  or asymmetry  Functional ROM: ROM appears unrestricted          Functional ROM: ROM appears unrestricted          Muscle strength & Tone: Functionally intact  Muscle strength & Tone: Functionally intact  Sensory: Unimpaired  Sensory: Unimpaired  Palpation: Non-contributory  Palpation: Non-contributory   Thoracic Spine Exam  Inspection: No masses, redness, or swelling Alignment: Symmetrical Functional ROM: ROM appears unrestricted Stability: No instability detected Sensory: Unimpaired Muscle strength & Tone: Functionally intact Palpation: Non-contributory  Lumbar Spine Exam  Inspection: No masses, redness, or swelling Alignment: Symmetrical Functional ROM: ROM appears unrestricted Stability: No instability detected Muscle strength & Tone: Functionally intact Sensory: Unimpaired Palpation: Non-contributory Provocative Tests: Lumbar Hyperextension and rotation test: evaluation deferred today       Patrick's Maneuver: evaluation deferred today              Gait & Posture Assessment  Ambulation: Unassisted Gait: Relatively normal for age and body habitus Posture: WNL   Lower Extremity Exam  Side: Right lower extremity  Side: Left lower extremity  Inspection: No masses, redness, swelling, or asymmetry  Inspection: No masses, redness, swelling, or asymmetry  Functional ROM: ROM appears unrestricted          Functional ROM: ROM appears unrestricted          Muscle strength & Tone: Functionally intact  Muscle strength & Tone: Functionally intact  Sensory: Unimpaired  Sensory: Unimpaired  Palpation: Non-contributory  Palpation: Non-contributory   Assessment & Plan  Primary Diagnosis & Pertinent Problem List: The primary encounter diagnosis was Chronic pain. Diagnoses of Opiate use (10 MME/Day), Long term current use of opiate analgesic, Chronic low back pain (Location of Primary Source of Pain) (Bilateral) (L>R), Chronic lower extremity pain (Location of Secondary source of pain)  (Left), Lumbar facet syndrome (Location of Primary Source of Pain) (Bilateral) (L>R), Muscle spasm, nocturnal, and Musculoskeletal pain were also pertinent to this visit.  Visit Diagnosis: 1. Chronic pain   2. Opiate use (10 MME/Day)   3. Long term current use of opiate analgesic   4. Chronic low back pain (Location of Primary Source of Pain) (Bilateral) (L>R)   5. Chronic lower extremity pain (Location of Secondary source of pain) (Left)   6. Lumbar facet syndrome (Location of Primary Source of Pain) (Bilateral) (L>R)   7. Muscle spasm, nocturnal   8. Musculoskeletal pain     Problems updated and reviewed during this visit: No problems updated. Problem-specific Plan(s): No problem-specific Assessment & Plan notes found for this encounter.  No new Assessment & Plan notes have been filed under this hospital service since the last note was generated. Service: Pain Management  Plan of Care   Problem List Items Addressed This Visit      High   Chronic low back pain (Location of Primary Source of Pain) (Bilateral) (L>R) (Chronic)   Relevant Medications   HYDROcodone-acetaminophen (NORCO/VICODIN) 5-325 MG tablet (Start on 11/15/2015)   HYDROcodone-acetaminophen (NORCO/VICODIN) 5-325 MG tablet (Start on 12/15/2015)   HYDROcodone-acetaminophen (NORCO/VICODIN) 5-325 MG tablet (Start on 01/14/2016)   metaxalone (SKELAXIN) 800 MG tablet (Start on 11/15/2015)   Chronic lower extremity pain (Location of Secondary source of pain) (Left) (Chronic)   Relevant Medications   HYDROcodone-acetaminophen (NORCO/VICODIN) 5-325 MG tablet (Start on 11/15/2015)   HYDROcodone-acetaminophen (NORCO/VICODIN) 5-325 MG tablet (Start on 12/15/2015)   HYDROcodone-acetaminophen (NORCO/VICODIN) 5-325 MG tablet (Start on 01/14/2016)   metaxalone (SKELAXIN) 800 MG tablet (Start on 11/15/2015)   Chronic pain - Primary (Chronic)   Relevant Medications   HYDROcodone-acetaminophen (NORCO/VICODIN) 5-325 MG tablet (Start on  11/15/2015)   HYDROcodone-acetaminophen (NORCO/VICODIN) 5-325 MG tablet (Start on 12/15/2015)   HYDROcodone-acetaminophen (NORCO/VICODIN) 5-325 MG tablet (Start on 01/14/2016)   metaxalone (SKELAXIN) 800 MG tablet (Start on 11/15/2015)   Lumbar facet syndrome (Location of Primary Source of Pain) (Bilateral) (L>R) (Chronic)   Relevant Medications   HYDROcodone-acetaminophen (NORCO/VICODIN) 5-325 MG tablet (Start on 11/15/2015)   HYDROcodone-acetaminophen (NORCO/VICODIN) 5-325 MG tablet (Start on 12/15/2015)   HYDROcodone-acetaminophen (NORCO/VICODIN) 5-325 MG tablet (Start on 01/14/2016)   metaxalone (SKELAXIN) 800 MG tablet (Start on 11/15/2015)   Muscle spasm, nocturnal (Chronic)   Relevant Medications   metaxalone (SKELAXIN) 800 MG tablet (Start on 11/15/2015)   Musculoskeletal pain (Chronic)   Relevant Medications   metaxalone (SKELAXIN) 800 MG tablet (Start on 11/15/2015)     Medium   Long term current use of opiate analgesic (Chronic)   Opiate use (10 MME/Day) (Chronic)    Other Visit Diagnoses   None.  Pharmacotherapy (Medications Ordered): Meds ordered this encounter  Medications  . HYDROcodone-acetaminophen (NORCO/VICODIN) 5-325 MG tablet    Sig: Take 0.5-1 tablets by mouth 2 (two) times daily as needed for severe pain.    Dispense:  60 tablet    Refill:  0    Do not add this medication to the electronic "Automatic Refill" notification system. Patient may have prescription filled one day early if pharmacy is closed on scheduled refill date. Do not fill until: 11/15/15 To last until: 12/15/15  . HYDROcodone-acetaminophen (NORCO/VICODIN) 5-325 MG tablet    Sig: Take 0.5-1 tablets by mouth 2 (two) times daily as needed for severe pain.    Dispense:  60 tablet    Refill:  0    Do not add this medication to the electronic "Automatic Refill" notification system. Patient may have prescription filled one day early if pharmacy is closed on scheduled refill date. Do not fill until:  12/15/15 To last until: 01/14/16  . HYDROcodone-acetaminophen (NORCO/VICODIN) 5-325 MG tablet    Sig: Take 0.5-1 tablets by mouth 2 (two) times daily as needed for severe pain.    Dispense:  60 tablet    Refill:  0    Do not add this medication to the electronic "Automatic Refill" notification system. Patient may have prescription filled one day early if pharmacy is closed on scheduled refill date. Do not fill until: 01/14/16 To last until: 02/13/16  . metaxalone (SKELAXIN) 800 MG tablet    Sig: Take 1 tablet (800 mg total) by mouth 3 (three) times daily as needed for muscle spasms.    Dispense:  90 tablet    Refill:  2    Do not add this medication to the electronic "Automatic Refill" notification system. Patient may have prescription filled one day early if pharmacy is closed on scheduled refill date.   Lab-work & Procedure Ordered: No orders of the defined types were placed in this encounter.  Imaging Ordered: None  Interventional Therapies: Scheduled:  None at this time.    Considering:   Diagnostic left L3-4 lumbar epidural steroid injection under fluoroscopic guidance, with or without sedation.  Diagnostic bilateral lumbar facet block under fluoroscopic guidance and IV sedation.  Possible bilateral lumbar facet radiofrequency ablation.  Diagnostic left-sided cervical epidural steroid injection under fluoroscopic guidance, with or without sedation.  Diagnostic bilateral cervical facet block under fluoroscopic guidance and IV sedation.  Possible bilateral cervical facet radiofrequency ablation.    PRN Procedures:   Diagnostic left L3-4 lumbar epidural steroid injection under fluoroscopic guidance, with or without sedation.  Diagnostic bilateral lumbar facet block under fluoroscopic guidance and IV sedation.  Diagnostic left-sided cervical epidural steroid injection under fluoroscopic guidance, with or without sedation.  Diagnostic bilateral cervical facet block under  fluoroscopic guidance and IV sedation.    Referral(s) or Consult(s): None at this time.  New Prescriptions   No medications on file    Medications administered during this visit: Ms. Whittier had no medications administered during this visit.  Requested PM Follow-up: Return in 3 months (on 02/02/2016) for Med-Mgmt, In addition, (PRN) Procedure.  Future Appointments Date Time Provider Department Center  11/21/2015 9:00 AM [facility] ULTRASOUND MACHINE [facility] None  11/21/2015 9:30 AM Ok Edwards, MD [facility]  11/21/2015 1:00 PM Drema Dallas, DO [facility] None  02/02/2016 9:00 AM Delano Metz, MD [facility] None    Primary Care Physician: Ruthe Mannan, MD Location: [facility] Note by: Sydnee Levans. Laban Emperor, M.D, DABA, DABAPM, DABPM,  DABIPP, FIPP  Pain Score Disclaimer: We use the NRS-11 scale. This is a self-reported, subjective measurement of pain severity with only modest accuracy. It is used primarily to identify changes within a particular patient. It must be understood that outpatient pain scales are significantly less accurate that those used for research, where they can be applied under ideal controlled circumstances with minimal exposure to variables. In reality, the score is likely to be a combination of pain intensity and pain affect, where pain affect describes the degree of emotional arousal or changes in action readiness caused by the sensory experience of pain. Factors such as social and work situation, setting, emotional state, anxiety levels, expectation, and prior pain experience may influence pain perception and show large inter-individual differences that may also be affected by time variables.  Patient instructions provided during this appointment: There are no Patient Instructions on file for this visit.

## 2015-11-14 ENCOUNTER — Ambulatory Visit: Payer: Self-pay | Admitting: Neurology

## 2015-11-17 ENCOUNTER — Telehealth: Payer: Self-pay | Admitting: Neurology

## 2015-11-17 NOTE — Telephone Encounter (Signed)
Pt called and is concerned that her lip went numb Fri and Sat around the same time of day.  Please call - wondering if it could be meds or something else.

## 2015-11-17 NOTE — Telephone Encounter (Signed)
TIAs and stroke don't usually recur with the same exact symptoms.  The numbness of the lips and tongue is likely not TIA or stroke.  She should go to the ED if she experiences slurred speech or unilateral weakness or numbness.  Last time, she had left sided weakness.   If this should recur, stroke is less likely but I would still go to the ED since it has only occurred once before and it should still be re-evaluated.

## 2015-11-17 NOTE — Telephone Encounter (Signed)
Message relayed to patient. Verbalized understanding. Pt did question about what signs/symptoms should make her report to E.R. (if clinic is closed). Pt concerned she could mistake TIA/Stroke for episode she had. Did inform pt that TIA/Stroke symptoms would include weakness and slurred speech. Please advise.

## 2015-11-17 NOTE — Telephone Encounter (Signed)
Pt complaining of numbness of lip on both Friday and Saturday. It was around the same time of day. No other environmental similarities noted. Please advise.

## 2015-11-17 NOTE — Telephone Encounter (Signed)
I don't have an explanation.  It could be environmental or medication side effect if she can associated it.  It is likely not a TIA given that it is the second time it happened.

## 2015-11-17 NOTE — Telephone Encounter (Signed)
Message relayed to patient. Verbalized understanding and denied questions.   

## 2015-11-21 ENCOUNTER — Other Ambulatory Visit: Payer: Managed Care, Other (non HMO)

## 2015-11-21 ENCOUNTER — Ambulatory Visit (INDEPENDENT_AMBULATORY_CARE_PROVIDER_SITE_OTHER): Payer: Managed Care, Other (non HMO) | Admitting: Neurology

## 2015-11-21 ENCOUNTER — Encounter: Payer: Self-pay | Admitting: Neurology

## 2015-11-21 ENCOUNTER — Ambulatory Visit: Payer: Managed Care, Other (non HMO) | Admitting: Gynecology

## 2015-11-21 VITALS — BP 132/82 | HR 68 | Ht 64.0 in | Wt 251.0 lb

## 2015-11-21 DIAGNOSIS — G459 Transient cerebral ischemic attack, unspecified: Secondary | ICD-10-CM

## 2015-11-21 DIAGNOSIS — I10 Essential (primary) hypertension: Secondary | ICD-10-CM | POA: Diagnosis not present

## 2015-11-21 DIAGNOSIS — F419 Anxiety disorder, unspecified: Secondary | ICD-10-CM | POA: Diagnosis not present

## 2015-11-21 DIAGNOSIS — E119 Type 2 diabetes mellitus without complications: Secondary | ICD-10-CM | POA: Diagnosis not present

## 2015-11-21 MED ORDER — ATORVASTATIN CALCIUM 10 MG PO TABS: 10.0000 mg | ORAL_TABLET | Freq: Every day | ORAL | 5 refills | Status: DC

## 2015-11-21 NOTE — Progress Notes (Signed)
NEUROLOGY FOLLOW UP OFFICE NOTE  Debra Myers [id number]  HISTORY OF PRESENT ILLNESS: Debra Myers is a 50 year old right-handed woman with hypertension, type 2 diabetes, degenerative disc disease, anxiety and depression and status post lap-band surgery who follows up for TIA/transient unilateral weakness.  UPDATE: She is taking ASA 81mg  daily and Lipitor 10mg  daily.  Recent LDL was 59.  Carotid doppler revealed no hemodynamically significant stenosis.  Last weekend, she reported numbness of her bottom lip and tip of her tongue.  It lasted 2 days.  It was not accompanied by unilateral weakness or slurred speech.  She did report stress leading up to the weekend.   HISTORY: She was admitted to [facility] from 06/11/15 to 06/12/15 for acute onset of numbness that started at the end of her tongue and then spread to the lower lip and left upper and lower weakness.  There was also associated word-finding difficulties.  Symptoms didn't last long but she still had numbness on the tongue and side of mouth in the hospital.  Due to NIHSS score of 0 at time of evaluation, she did not receive tPA.  CT of head on 06/11/15 was negative.  Repeat CT of head the following day was unchanged.  MRI and MRA of head were unremarkable.  2D echo showed EF 60-65% with moderate LVH but no cardiac source of emboli.  LDL was 87.  Hgb A1c was 5.9.  She was discharged on ASA 81mg  daily with a diagnosis of TIA.   She reports history of significant stress and anxiety, which was particularly more severe for the prior several days.  She also started Vyvance to help treat binge eating.  PAST MEDICAL HISTORY: Past Medical History:  Diagnosis Date  . Anxiety   . Asthma    ALLERGY INDUCED  . Bulging lumbar disc (L3-4) 02/26/2015  . Degenerative disc disease   . Degenerative lumbar disc   . Depression   . Diabetes mellitus    TYPE 2  . Elective abortion    ONE  . GERD (gastroesophageal reflux disease)    takes prilosec   .  Hypertension   . Stroke (HCC) 06/11/2015   patient describes as mini stroke  . Vaginal delivery    ONE NSVD    MEDICATIONS: Current Outpatient Prescriptions on File Prior to Visit  Medication Sig Dispense Refill  . aspirin EC 81 MG tablet Take 1 tablet (81 mg total) by mouth daily. 30 tablet 2  . buPROPion (WELLBUTRIN XL) 300 MG 24 hr tablet Take 300 mg by mouth daily.    . cetirizine (ZYRTEC) 10 MG tablet Take 10 mg by mouth at bedtime.    . fluticasone (FLONASE) 50 MCG/ACT nasal spray Place 2 sprays into both nostrils daily as needed for rhinitis. 16 g 1  . HYDROcodone-acetaminophen (NORCO/VICODIN) 5-325 MG tablet Take 0.5-1 tablets by mouth 2 (two) times daily as needed for severe pain. 60 tablet 0  . [START ON 12/15/2015] HYDROcodone-acetaminophen (NORCO/VICODIN) 5-325 MG tablet Take 0.5-1 tablets by mouth 2 (two) times daily as needed for severe pain. 60 tablet 0  . [START ON 01/14/2016] HYDROcodone-acetaminophen (NORCO/VICODIN) 5-325 MG tablet Take 0.5-1 tablets by mouth 2 (two) times daily as needed for severe pain. 60 tablet 0  . ibuprofen (ADVIL,MOTRIN) 200 MG tablet Take 800 mg by mouth every 4 (four) hours as needed for headache or mild pain.     Marland Kitchen lamoTRIgine (LAMICTAL) 200 MG tablet Take 300 mg by mouth at bedtime.     Marland Kitchen  LORazepam (ATIVAN) 1 MG tablet Take 0.5-1 mg by mouth every 8 (eight) hours as needed for anxiety or sleep.     . metaxalone (SKELAXIN) 800 MG tablet Take 1 tablet (800 mg total) by mouth 3 (three) times daily as needed for muscle spasms. 90 tablet 2  . metFORMIN (GLUCOPHAGE) 500 MG tablet take 1 tablet by mouth once daily with BREAKFAST 90 tablet 2  . montelukast (SINGULAIR) 10 MG tablet Take 1 tablet (10 mg total) by mouth at bedtime. 90 tablet 3  . pantoprazole (PROTONIX) 40 MG tablet Take 1 tablet (40 mg total) by mouth daily. 30 tablet 3  . PARoxetine (PAXIL-CR) 25 MG 24 hr tablet Take 50 mg by mouth at bedtime.     Marland Kitchen. spironolactone (ALDACTONE) 25 MG tablet  Take 1 tablet (25 mg total) by mouth daily. 90 tablet 3  . VYVANSE 30 MG capsule Take 30 mg by mouth daily.  0   No current facility-administered medications on file prior to visit.     ALLERGIES: Allergies  Allergen Reactions  . Oxycodone Itching  . Penicillins Other (See Comments)    Reaction:  GI upset  Has patient had a PCN reaction causing immediate rash, facial/tongue/throat swelling, SOB or lightheadedness with hypotension: No Has patient had a PCN reaction causing severe rash involving mucus membranes or skin necrosis: No Has patient had a PCN reaction that required hospitalization No Has patient had a PCN reaction occurring within the last 10 years: No If all of the above answers are "NO", then may proceed with Cephalosporin use.  . Sulfa Antibiotics Itching and Rash    FAMILY HISTORY: Family History  Problem Relation Age of Onset  . Hypertension Father   . Diabetes Father   . Hypertension Mother   . Asthma Maternal Grandmother   . Stroke Maternal Grandmother   . Stroke Maternal Grandfather     SOCIAL HISTORY: Social History   Social History  . Marital status: Married    Spouse name: N/A  . Number of children: N/A  . Years of education: N/A   Occupational History  . Not on file.   Social History Main Topics  . Smoking status: Never Smoker  . Smokeless tobacco: Never Used  . Alcohol use No     Comment: rarely  . Drug use: No  . Sexual activity: Not Currently    Birth control/ protection: IUD   Other Topics Concern  . Not on file   Social History Narrative  . No narrative on file    REVIEW OF SYSTEMS: Constitutional: No fevers, chills, or sweats, no generalized fatigue, change in appetite Eyes: No visual changes, double vision, eye pain Ear, nose and throat: No hearing loss, ear pain, nasal congestion, sore throat Cardiovascular: No chest pain, palpitations Respiratory:  No shortness of breath at rest or with exertion, wheezes GastrointestinaI:  No nausea, vomiting, diarrhea, abdominal pain, fecal incontinence Genitourinary:  No dysuria, urinary retention or frequency Musculoskeletal:  No neck pain, back pain Integumentary: No rash, pruritus, skin lesions Neurological: as above Psychiatric: No depression, insomnia, anxiety Endocrine: No palpitations, fatigue, diaphoresis, mood swings, change in appetite, change in weight, increased thirst Hematologic/Lymphatic:  No purpura, petechiae. Allergic/Immunologic: no itchy/runny eyes, nasal congestion, recent allergic reactions, rashes  PHYSICAL EXAM: Vitals:   11/21/15 1312  BP: 132/82  Pulse: 68   General: No acute distress.  Patient appears well-groomed.  . Head:  Normocephalic/atraumatic Eyes:  Fundi examined but not visualized Neck: supple, no paraspinal tenderness, full  range of motion Heart:  Regular rate and rhythm Lungs:  Clear to auscultation bilaterally Back: No paraspinal tenderness Neurological Exam: alert and oriented to person, place, and time. Attention span and concentration intact, recent and remote memory intact, fund of knowledge intact.  Speech fluent and not dysarthric, language intact.  CN II-XII intact. Bulk and tone normal, muscle strength 5/5 throughout.  Sensation to light touch  intact.  Deep tendon reflexes 2+ throughout.  Finger to nose testing intact.  Gait normal, Romberg negative.  IMPRESSION: Episode of transient unilateral weakness and perioral numbness.  My suspicion is that it was related to anxiety.  However, TIA cannot be ruled out, as she does have stroke risk factors.  I do believe the perioral numbness from last weekend was anxiety. HTN Type 2 diabetes Morbid obesity  PLAN: 1.  Continue ASA 81mg  daily for secondary stroke prevention 2.  Continue Lipitor (LDL at goal less than 70) 3.  Blood pressure and glycemic control.   4.  Weight loss 5.  Follow up in 6 months.  28 minutes spent face to face with patient, over 50% spent  counseling.  Shon Millet, DO  CC:  Ruthe Mannan, MD

## 2015-11-21 NOTE — Patient Instructions (Signed)
1.  Continue aspirin 81mg  daily 2.  Continue Lipitor 10mg  daily 3.  Mediterranean diet    Why follow it? Research shows. . Those who follow the Mediterranean diet have a reduced risk of heart disease  . The diet is associated with a reduced incidence of Parkinson's and Alzheimer's diseases . People following the diet may have longer life expectancies and lower rates of chronic diseases  . The Dietary Guidelines for Americans recommends the Mediterranean diet as an eating plan to promote health and prevent disease  What Is the Mediterranean Diet?  . Healthy eating plan based on typical foods and recipes of Mediterranean-style cooking . The diet is primarily a plant based diet; these foods should make up a majority of meals   Starches - Plant based foods should make up a majority of meals - They are an important sources of vitamins, minerals, energy, antioxidants, and fiber - Choose whole grains, foods high in fiber and minimally processed items  - Typical grain sources include wheat, oats, barley, corn, brown rice, bulgar, farro, millet, polenta, couscous  - Various types of beans include chickpeas, lentils, fava beans, black beans, white beans   Fruits  Veggies - Large quantities of antioxidant rich fruits & veggies; 6 or more servings  - Vegetables can be eaten raw or lightly drizzled with oil and cooked  - Vegetables common to the traditional Mediterranean Diet include: artichokes, arugula, beets, broccoli, brussel sprouts, cabbage, carrots, celery, collard greens, cucumbers, eggplant, kale, leeks, lemons, lettuce, mushrooms, okra, onions, peas, peppers, potatoes, pumpkin, radishes, rutabaga, shallots, spinach, sweet potatoes, turnips, zucchini - Fruits common to the Mediterranean Diet include: apples, apricots, avocados, cherries, clementines, dates, figs, grapefruits, grapes, melons, nectarines, oranges, peaches, pears, pomegranates, strawberries, tangerines  Fats - Replace butter and  margarine with healthy oils, such as olive oil, canola oil, and tahini  - Limit nuts to no more than a handful a day  - Nuts include walnuts, almonds, pecans, pistachios, pine nuts  - Limit or avoid candied, honey roasted or heavily salted nuts - Olives are central to the [employer]iterranean diet - can be eaten whole or used in a variety of dishes   Meats Protein - Limiting red meat: no more than a few times a month - When eating red meat: choose lean cuts and keep the portion to the size of deck of cards - Eggs: approx. 0 to 4 times a week  - Fish and lean poultry: at least 2 a week  - Healthy protein sources include, chicken, [place], lean beef, lamb - Increase intake of seafood such as tuna, salmon, trout, mackerel, shrimp, scallops - Avoid or limit high fat processed meats such as sausage and bacon  Dairy - Include moderate amounts of low fat dairy products  - Focus on healthy dairy such as fat free yogurt, skim milk, low or reduced fat cheese - Limit dairy products higher in fat such as whole or 2% milk, cheese, ice cream  Alcohol - Moderate amounts of red wine is ok  - No more than 5 oz daily for women (all ages) and men older than age 49  - No more than 10 oz of wine daily for men younger than 7265  Other - Limit sweets and other desserts  - Use herbs and spices instead of salt to flavor foods  - Herbs and spices common to the traditional Mediterranean Diet include: basil, bay leaves, chives, cloves, cumin, fennel, garlic, lavender, marjoram, mint, oregano, parsley, pepper, rosemary, sage, savory,  sumac, tarragon, thyme   It's not just a diet, it's a lifestyle:  . The Mediterranean diet includes lifestyle factors typical of those in the region  . Foods, drinks and meals are best eaten with others and savored . Daily physical activity is important for overall good health . This could be strenuous exercise like running and aerobics . This could also be more leisurely activities such as  walking, housework, yard-work, or taking the stairs . Moderation is the key; a balanced and healthy diet accommodates most foods and drinks . Consider portion sizes and frequency of consumption of certain foods   Meal Ideas & Options:  . Breakfast:  o Whole wheat toast or whole wheat English muffins with peanut butter & hard boiled egg o Steel cut oats topped with apples & cinnamon and skim milk  o Fresh fruit: banana, strawberries, melon, berries, peaches  o Smoothies: strawberries, bananas, greek yogurt, peanut butter o Low fat greek yogurt with blueberries and granola  o Egg white omelet with spinach and mushrooms o Breakfast couscous: whole wheat couscous, apricots, skim milk, cranberries  . Sandwiches:  o Hummus and grilled vegetables (peppers, zucchini, squash) on whole wheat bread   o Grilled chicken on whole wheat pita with lettuce, tomatoes, cucumbers or tzatziki  o Tuna salad on whole wheat bread: tuna salad made with greek yogurt, olives, red peppers, capers, green onions o Garlic rosemary lamb pita: lamb sauted with garlic, rosemary, salt & pepper; add lettuce, cucumber, greek yogurt to pita - flavor with lemon juice and black pepper  . Seafood:  o Mediterranean grilled salmon, seasoned with garlic, basil, parsley, lemon juice and black pepper o Shrimp, lemon, and spinach whole-grain pasta salad made with low fat greek yogurt  o Seared scallops with lemon orzo  o Seared tuna steaks seasoned salt, pepper, coriander topped with tomato mixture of olives, tomatoes, olive oil, minced garlic, parsley, green onions and cappers  . Meats:  o Herbed greek chicken salad with kalamata olives, cucumber, feta  o Red bell peppers stuffed with spinach, bulgur, lean ground beef (or lentils) & topped with feta   o Kebabs: skewers of chicken, tomatoes, onions, zucchini, squash  o [place] burgers: made with red onions, mint, dill, lemon juice, feta cheese topped with roasted red  peppers . Vegetarian o Cucumber salad: cucumbers, artichoke hearts, celery, red onion, feta cheese, tossed in olive oil & lemon juice  o Hummus and whole grain pita points with a greek salad (lettuce, tomato, feta, olives, cucumbers, red onion) o Lentil soup with celery, carrots made with vegetable broth, garlic, salt and pepper  o Tabouli salad: parsley, bulgur, mint, scallions, cucumbers, tomato, radishes, lemon juice, olive oil, salt and pepper. 4.  Follow up in 6 months.

## 2015-11-27 ENCOUNTER — Encounter: Payer: Self-pay | Admitting: Neurology

## 2016-01-23 ENCOUNTER — Ambulatory Visit (INDEPENDENT_AMBULATORY_CARE_PROVIDER_SITE_OTHER): Payer: Managed Care, Other (non HMO)

## 2016-01-23 ENCOUNTER — Ambulatory Visit (INDEPENDENT_AMBULATORY_CARE_PROVIDER_SITE_OTHER): Payer: Managed Care, Other (non HMO) | Admitting: Gynecology

## 2016-01-23 ENCOUNTER — Encounter: Payer: Self-pay | Admitting: Gynecology

## 2016-01-23 VITALS — BP 130/84

## 2016-01-23 DIAGNOSIS — N83202 Unspecified ovarian cyst, left side: Secondary | ICD-10-CM | POA: Diagnosis not present

## 2016-01-23 NOTE — Progress Notes (Signed)
   Patient is a 50 year old who presented to the office today to discuss her ultrasound. We have been monitoring her left ovarian cyst and small calcified area on the right breast. Her history is as follows:  In December 2015 patient had had an ultrasound as a result of right lower quadrant discomfort on and off and ultrasound had demonstrated the following: T/V anteverted uterus with IUD seen in normal position. Right ovary rim of tissue seen with thin-walled echo-free avascular cyst 43 x 48 x 37 mean 43 mm. Avascular. Left ovary normal. Negative cul-de-sac. Endometrium 3.2 mm. also in July 2016 patient underwent an outpatient resectoscopic polypectomy as a result of dysfunction uterine bleeding and endometrial polyp and had a Mirena IUD placed per she has done well as to her cycles. Patient had received Depo-Provera 150 mg IM in hopes of resolution of the cyst which appeared to be benign but she did not return to the office for that follow-up ultrasound until today.  Ultrasound done 07/11/2015 demonstrated the following: Uterus measured 8.1 x 4.7 x 3.8 cm with endometrial stripe of 3.6 mm. IUD was seen in the normal position. Right ovarian cyst not seen complete resolution. A solid calcified area measured 13 x 12 x 12 mm was noted echogenic, negative color flow. Arterial blood flow was seen to that right ovary. Left ovary thinwall cyst with layering of debris negative color flow. Mass measuring 3.5 x 2.6 x 3.7 cm average size 3.3 mm no fluid in the cul-de-sac Patient refused a second round of Depo-Provera and a CA-125 that was drawn was normal with a value of 14  Patient is asymptomatic ultrasound today demonstrated following: Uterus measured 8.2 x 4.3 0.6 cm endometrial stripe 4.1 mm. IUD was seen in the normal position. Right ovary continued presence of a solid echogenic focus 13 x 12 mm negative color flow. Left ovary thinwall echo-free cyst measuring 32 x 34 x 29 mm oversize 3.2 cm negative color  flow. Since had some slight low level echoes 13 x 10 mm. Negative color flow Doppler no fluid in the cul-de-sac.  Assessment/plan: Calcified small area of the right ovary. Thinwall echo-free benign-appearing left ovarian cyst normal CA 125 I explained to the patient sometime receive these of functional cysts especially patient's with the Mirena IUD. She scheduled to return back in 6 months for her annual exam and we'll repeat the ultrasound at that point. Patient was otherwise asymptomatic.

## 2016-01-23 NOTE — Patient Instructions (Signed)
Ovarian Cyst  An ovarian cyst is a fluid-filled sac that forms on an ovary. The ovaries are small organs that produce eggs in women. Various types of cysts can form on the ovaries. Some may cause symptoms and require treatment. Most ovarian cysts go away on their own, are not cancerous (are benign), and do not cause problems. Common types of ovarian cysts include:  Functional (follicle) cysts.  Occur during the menstrual cycle, and usually go away with the next menstrual cycle if you do not get pregnant.  Usually cause no symptoms.  Endometriomas.  Are cysts that form from the tissue that lines the uterus (endometrium).  Are sometimes called "chocolate cysts" because they become filled with blood that turns brown.  Can cause pain in the lower abdomen during intercourse and during your period.  Cystadenoma cysts.  Develop from cells on the outside surface of the ovary.  Can get very large and cause lower abdomen pain and pain with intercourse.  Can cause severe pain if they twist or break open (rupture).  Dermoid cysts.  Are sometimes found in both ovaries.  May contain different kinds of body tissue, such as skin, teeth, hair, or cartilage.  Usually do not cause symptoms unless they get very big.  Theca lutein cysts.  Occur when too much of a certain hormone (human chorionic gonadotropin) is produced and overstimulates the ovaries to produce an egg.  Are most common after having procedures used to assist with the conception of a baby (in vitro fertilization). What are the causes? Ovarian cysts may be caused by:  Ovarian hyperstimulation syndrome. This is a condition that can develop from taking fertility medicines. It causes multiple large ovarian cysts to form.  Polycystic ovarian syndrome (PCOS). This is a common hormonal disorder that can cause ovarian cysts, as well as problems with your period or fertility. What increases the risk? The following factors may make you  more likely to develop ovarian cysts:  Being overweight or obese.  Taking fertility medicines.  Taking certain forms of hormonal birth control.  Smoking. What are the signs or symptoms? Many ovarian cysts do not cause symptoms. If symptoms are present, they may include:  Pelvic pain or pressure.  Pain in the lower abdomen.  Pain during sex.  Abdominal swelling.  Abnormal menstrual periods.  Increasing pain with menstrual periods. How is this diagnosed? These cysts are commonly found during a routine pelvic exam. You may have tests to find out more about the cyst, such as:  Ultrasound.  X-ray of the pelvis.  CT scan.  MRI.  Blood tests. How is this treated? Many ovarian cysts go away on their own without treatment. Your health care provider may want to check your cyst regularly for 2-3 months to see if it changes. If you are in menopause, it is especially important to have your cyst monitored closely because menopausal women have a higher rate of ovarian cancer. When treatment is needed, it may include:  Medicines to help relieve pain.  A procedure to drain the cyst (aspiration).  Surgery to remove the whole cyst.  Hormone treatment or birth control pills. These methods are sometimes used to help dissolve a cyst. Follow these instructions at home:  Take over-the-counter and prescription medicines only as told by your health care provider.  Do not drive or use heavy machinery while taking prescription pain medicine.  Get regular pelvic exams and Pap tests as often as told by your health care provider.  Return to your   normal activities as told by your health care provider. Ask your health care provider what activities are safe for you.  Do not use any products that contain nicotine or tobacco, such as cigarettes and e-cigarettes. If you need help quitting, ask your health care provider.  Keep all follow-up visits as told by your health care provider. This is  important. Contact a health care provider if:  Your periods are late, irregular, or painful, or they stop.  You have pelvic pain that does not go away.  You have pressure on your bladder or trouble emptying your bladder completely.  You have pain during sex.  You have any of the following in your abdomen:  A feeling of fullness.  Pressure.  Discomfort.  Pain that does not go away.  Swelling.  You feel generally ill.  You become constipated.  You lose your appetite.  You develop severe acne.  You start to have more body hair and facial hair.  You are gaining weight or losing weight without changing your exercise and eating habits.  You think you may be pregnant. Get help right away if:  You have abdominal pain that is severe or gets worse.  You cannot eat or drink without vomiting.  You suddenly develop a fever.  Your menstrual period is much heavier than usual. This information is not intended to replace advice given to you by your health care provider. Make sure you discuss any questions you have with your health care provider. Document Released: 02/08/2005 Document Revised: 08/29/2015 Document Reviewed: 07/13/2015 [employer]vier Interactive Patient Education  2017 [employer]  

## 2016-01-27 ENCOUNTER — Ambulatory Visit: Payer: Managed Care, Other (non HMO) | Admitting: Pain Medicine

## 2016-01-30 ENCOUNTER — Ambulatory Visit: Payer: Managed Care, Other (non HMO) | Attending: Pain Medicine | Admitting: Pain Medicine

## 2016-01-30 ENCOUNTER — Encounter: Payer: Self-pay | Admitting: Pain Medicine

## 2016-01-30 VITALS — BP 141/84 | HR 91 | Temp 97.9°F | Resp 16 | Ht 64.5 in | Wt 246.0 lb

## 2016-01-30 DIAGNOSIS — M5412 Radiculopathy, cervical region: Secondary | ICD-10-CM | POA: Insufficient documentation

## 2016-01-30 DIAGNOSIS — Z880 Allergy status to penicillin: Secondary | ICD-10-CM | POA: Insufficient documentation

## 2016-01-30 DIAGNOSIS — M48061 Spinal stenosis, lumbar region without neurogenic claudication: Secondary | ICD-10-CM | POA: Insufficient documentation

## 2016-01-30 DIAGNOSIS — M79605 Pain in left leg: Secondary | ICD-10-CM

## 2016-01-30 DIAGNOSIS — Z833 Family history of diabetes mellitus: Secondary | ICD-10-CM | POA: Insufficient documentation

## 2016-01-30 DIAGNOSIS — Z9049 Acquired absence of other specified parts of digestive tract: Secondary | ICD-10-CM | POA: Insufficient documentation

## 2016-01-30 DIAGNOSIS — M5126 Other intervertebral disc displacement, lumbar region: Secondary | ICD-10-CM | POA: Diagnosis not present

## 2016-01-30 DIAGNOSIS — F411 Generalized anxiety disorder: Secondary | ICD-10-CM | POA: Insufficient documentation

## 2016-01-30 DIAGNOSIS — F1190 Opioid use, unspecified, uncomplicated: Secondary | ICD-10-CM | POA: Diagnosis not present

## 2016-01-30 DIAGNOSIS — Z823 Family history of stroke: Secondary | ICD-10-CM | POA: Insufficient documentation

## 2016-01-30 DIAGNOSIS — K219 Gastro-esophageal reflux disease without esophagitis: Secondary | ICD-10-CM | POA: Insufficient documentation

## 2016-01-30 DIAGNOSIS — Z7984 Long term (current) use of oral hypoglycemic drugs: Secondary | ICD-10-CM | POA: Diagnosis not present

## 2016-01-30 DIAGNOSIS — E119 Type 2 diabetes mellitus without complications: Secondary | ICD-10-CM | POA: Diagnosis not present

## 2016-01-30 DIAGNOSIS — Z7982 Long term (current) use of aspirin: Secondary | ICD-10-CM | POA: Insufficient documentation

## 2016-01-30 DIAGNOSIS — M545 Low back pain: Secondary | ICD-10-CM | POA: Diagnosis not present

## 2016-01-30 DIAGNOSIS — Z885 Allergy status to narcotic agent status: Secondary | ICD-10-CM | POA: Insufficient documentation

## 2016-01-30 DIAGNOSIS — Z8249 Family history of ischemic heart disease and other diseases of the circulatory system: Secondary | ICD-10-CM | POA: Insufficient documentation

## 2016-01-30 DIAGNOSIS — F329 Major depressive disorder, single episode, unspecified: Secondary | ICD-10-CM | POA: Insufficient documentation

## 2016-01-30 DIAGNOSIS — E785 Hyperlipidemia, unspecified: Secondary | ICD-10-CM | POA: Diagnosis not present

## 2016-01-30 DIAGNOSIS — M791 Myalgia: Secondary | ICD-10-CM | POA: Insufficient documentation

## 2016-01-30 DIAGNOSIS — Z6841 Body Mass Index (BMI) 40.0 and over, adult: Secondary | ICD-10-CM | POA: Insufficient documentation

## 2016-01-30 DIAGNOSIS — G8929 Other chronic pain: Secondary | ICD-10-CM

## 2016-01-30 DIAGNOSIS — Z8673 Personal history of transient ischemic attack (TIA), and cerebral infarction without residual deficits: Secondary | ICD-10-CM | POA: Insufficient documentation

## 2016-01-30 DIAGNOSIS — G894 Chronic pain syndrome: Secondary | ICD-10-CM | POA: Insufficient documentation

## 2016-01-30 DIAGNOSIS — Z79891 Long term (current) use of opiate analgesic: Secondary | ICD-10-CM | POA: Insufficient documentation

## 2016-01-30 DIAGNOSIS — Z881 Allergy status to other antibiotic agents status: Secondary | ICD-10-CM | POA: Diagnosis not present

## 2016-01-30 DIAGNOSIS — M542 Cervicalgia: Secondary | ICD-10-CM

## 2016-01-30 DIAGNOSIS — Z825 Family history of asthma and other chronic lower respiratory diseases: Secondary | ICD-10-CM | POA: Diagnosis not present

## 2016-01-30 DIAGNOSIS — M62838 Other muscle spasm: Secondary | ICD-10-CM

## 2016-01-30 DIAGNOSIS — M7918 Myalgia, other site: Secondary | ICD-10-CM

## 2016-01-30 MED ORDER — HYDROCODONE-ACETAMINOPHEN 5-325 MG PO TABS: 0.5000 | ORAL_TABLET | Freq: Two times a day (BID) | ORAL | 0 refills | Status: DC | PRN

## 2016-01-30 MED ORDER — METAXALONE 800 MG PO TABS: 800.0000 mg | ORAL_TABLET | Freq: Three times a day (TID) | ORAL | 2 refills | Status: DC | PRN

## 2016-01-30 NOTE — Progress Notes (Signed)
Patient's Name: Debra Myers  MRN: [id number]  Referring Provider: Lucille Passy, MD  DOB: [birth year]  PCP: Lucille Passy, MD  DOS: 01/30/2016  Note by: Kathlen Brunswick. Dossie Arbour, MD  Service setting: Ambulatory outpatient  Specialty: Interventional Pain Management  Location: [facility] (AMB) Pain Management Facility    Patient type: Established   Primary Reason(s) for Visit: Encounter for prescription drug management (Level of risk: moderate) CC: Back Pain (lower left) and Neck Pain  HPI  Debra Myers is a 50 y.o. year old, female patient, who comes today for a medication management evaluation. She has GOITER, NONTOXIC MULTINODULAR; Diabetes ([place]); POLYCYSTIC OVARIES; HYPERTRIGLYCERIDEMIA; Metabolic Syndrome X; OBESITY; Anxiety; Allergic rhinitis; Asthma; HIRSUTISM; Generalized anxiety disorder; Hypertension; Overweight(278.02); GERD (gastroesophageal reflux disease); IUD (intrauterine device) in place; Opiate use (10 MME/Day); Long term prescription opiate use; Long term current use of opiate analgesic; Encounter for therapeutic drug level monitoring; Encounter for chronic pain management; Numbness of upper extremity; Radiculitis involving upper extremity; Chronic low back pain (Location of Primary Source of Pain) (Bilateral) (L>R); Lumbar spondylosis (Bulging Disc & Severe Left Foraminal Stenosis at L3-4); Chronic lower extremity pain (Location of Secondary source of pain) (Left); Lumbar foraminal stenosis (Severe Left L3-4); Lumbar facet syndrome (Location of Primary Source of Pain) (Bilateral) (L>R); Cervical spondylosis (C5-6 & C6-7 DDD); Cervical (3 mm) Grade 1 Anterolisthesis of C4 over C5; Chronic neck pain (Location of Tertiary source of pain) (Bilateral) (L>R); Chronic cervical radicular pain (Bilateral) (L>R); Cervical foraminal stenosis (multilevel) (Bilateral); TIA (transient ischemic attack); Muscle spasm, nocturnal; Musculoskeletal pain; Carpal tunnel syndrome (Bilateral) (L>R); Cervical facet syndrome  (Location of Tertiary source of pain) (Bilateral) (L>R); Chronic pain syndrome; and Morbid obesity with BMI of 40.0-44.9, adult (HCC) on her problem list. Her primarily concern today is the Back Pain (lower left) and Neck Pain  Pain Assessment: Self-Reported Pain Score: 3 /10             Reported level is compatible with observation.       Pain Type: Chronic pain Pain Location: Back (neck) Pain Orientation: Lower, Left Pain Descriptors / Indicators: Aching, Shooting, Sharp Pain Frequency: Intermittent  Debra Myers was last seen on 11/10/2015 for medication management. During today's appointment we reviewed Debra Myers's chronic pain status, as well as her outpatient medication regimen.  The patient  reports that she does not use drugs. Her body mass index is 41.57 kg/m.  Further details on both, my assessment(s), as well as the proposed treatment plan, please see below.  Controlled Substance Pharmacotherapy Assessment REMS (Risk Evaluation and Mitigation Strategy)  Analgesic:Hydrocodone/APAP 5/325 2 tablets per day (10 mg/day) MME/day:10 mg/day  Janett Billow, RN  01/30/2016  9:39 AM  Sign at close encounter Nursing Pain Medication Assessment:  Safety precautions to be maintained throughout the outpatient stay will include: orient to surroundings, keep bed in low position, maintain call bell within reach at all times, provide assistance with transfer out of bed and ambulation.  Medication Inspection Compliance: Pill count conducted under aseptic conditions, in front of the patient. Neither the pills nor the bottle was removed from the patient's sight at any time. Once count was completed pills were immediately returned to the patient in their original bottle.  Medication: See above Pill Count: 19 of 60 pills remain Bottle Appearance: Standard pharmacy container. Clearly labeled. Filled Date: 18 / 15 / 2017 Medication last intake: 01/30/16   Pharmacokinetics: Liberation and  absorption (onset of action): WNL Distribution (time to peak effect): WNL Metabolism and  excretion (duration of action): WNL         Pharmacodynamics: Desired effects: Analgesia: Debra Myers reports >50% benefit. Functional ability: Patient reports that medication allows her to accomplish basic ADLs Clinically meaningful improvement in function (CMIF): Sustained CMIF goals met Perceived effectiveness: Described as relatively effective, allowing for increase in activities of daily living (ADL) Undesirable effects: Side-effects or Adverse reactions: None reported Monitoring: [place] PMP: Online review of the past 39-monthperiod conducted. Compliant with practice rules and regulations List of all UDS test(s) done:  Lab Results  Component Value Date   TOXASSSELUR FINAL 08/05/2015   TOXASSSELUR FINAL 05/26/2015   [place] 02/26/2015   Last UDS on record: ToxAssure Select 13  Date Value Ref Range Status  08/05/2015 FINAL  Final    Comment:    ==================================================================== TOXASSURE SELECT 13 (MW) ==================================================================== Test                             Result       Flag       Units Drug Present and Declared for Prescription Verification   Lorazepam                      925          EXPECTED   ng/mg creat    Source of lorazepam is a scheduled prescription medication.   Hydrocodone                    433          EXPECTED   ng/mg creat   Norhydrocodone                 871          EXPECTED   ng/mg creat    Sources of hydrocodone include scheduled prescription    medications. Norhydrocodone is an expected metabolite of    hydrocodone. ==================================================================== Test                      Result    Flag   Units      Ref Range   Creatinine              52               mg/dL      >=20 ==================================================================== Declared  Medications:  The flagging and interpretation on this report are based on the  following declared medications.  Unexpected results may arise from  inaccuracies in the declared medications.  **Note: The testing scope of this panel includes these medications:  Hydrocodone (Hydrocodone-Acetaminophen)  Lorazepam (Ativan)  **Note: The testing scope of this panel does not include following  reported medications:  Acetaminophen (Hydrocodone-Acetaminophen)  Albuterol  Aspirin  Atorvastatin  Bupropion (Wellbutrin)  Cetirizine  Fluticasone (Flonase)  Ibuprofen  Lamotrigine  Metaxalone (Skelaxin)  Metformin  Montelukast (Singulair)  Omeprazole  Paroxetine  Spironolactone (Aldactone) ==================================================================== For clinical consultation, please call [phone] ====================================================================    UDS interpretation: Compliant          Medication Assessment Form: Reviewed. Patient indicates being compliant with therapy Treatment compliance: Compliant Risk Assessment Profile: Aberrant behavior: See prior evaluations. None observed or detected today Comorbid factors increasing risk of overdose: See prior notes. No additional risks detected today Risk of substance use disorder (SUD): Low Opioid Risk Tool (ORT) Total Score: 6  Interpretation Table:  Score <3 =  Low Risk for SUD  Score between 4-7 = Moderate Risk for SUD  Score >8 = High Risk for Opioid Abuse   Risk Mitigation Strategies:  Patient Counseling: Covered Patient-Prescriber Agreement (PPA): Present and active  Notification to other healthcare providers: Done  Pharmacologic Plan: No change in therapy, at this time  Laboratory Chemistry  Inflammation Markers No results found for: ESRSEDRATE, CRP Renal Function Lab Results  Component Value Date   BUN 12 10/31/2015   CREATININE 0.86 10/31/2015   GFRAA 92 10/31/2015   GFRNONAA 80 10/31/2015    Hepatic Function Lab Results  Component Value Date   AST 16 10/31/2015   ALT 16 10/31/2015   ALBUMIN 4.4 10/31/2015   Electrolytes Lab Results  Component Value Date   NA 139 10/31/2015   K 4.9 10/31/2015   CL 99 10/31/2015   CALCIUM 9.1 10/31/2015   Pain Modulating Vitamins Lab Results  Component Value Date   VD25OH 44 05/18/2013   Coagulation Parameters Lab Results  Component Value Date   INR 1.10 06/11/2015   LABPROT 14.4 06/11/2015   APTT 33 06/11/2015   PLT 259 10/31/2015   Cardiovascular Lab Results  Component Value Date   HGB 12.0 06/12/2015   HCT 36.0 10/31/2015   Note: Lab results reviewed.  Recent Diagnostic Imaging Review  US Carotid Bilateral  Result Date: 07/18/2015 CLINICAL DATA:  TIA. History of hypertension, hyperlipidemia and diabetes. EXAM: BILATERAL CAROTID DUPLEX ULTRASOUND TECHNIQUE: Pearline Cables scale imaging, color Doppler and duplex ultrasound were performed of bilateral carotid and vertebral arteries in the neck. COMPARISON:  Brain MRI - 06/12/2015 FINDINGS: Criteria: Quantification of carotid stenosis is based on velocity parameters that correlate the residual internal carotid diameter with NASCET-based stenosis levels, using the diameter of the distal internal carotid lumen as the denominator for stenosis measurement. The following velocity measurements were obtained: RIGHT ICA:  86/33 cm/sec CCA:  59/56 cm/sec SYSTOLIC ICA/CCA RATIO:  1.0 DIASTOLIC ICA/CCA RATIO:  2.5 ECA:  101 cm/sec LEFT ICA:  73/31 cm/sec CCA:  387/56 cm/sec SYSTOLIC ICA/CCA RATIO:  0.7 DIASTOLIC ICA/CCA RATIO:  1.9 ECA:  104 cm/sec RIGHT CAROTID ARTERY: There is no grayscale evidence of significant intimal thickening or atherosclerotic plaque affecting the interrogated portions of the right carotid system. There are no elevated peak systolic velocities within the interrogated course the right internal carotid artery to suggest a hemodynamically significant stenosis. RIGHT VERTEBRAL  ARTERY:  Antegrade Flow LEFT CAROTID ARTERY: There is no grayscale evidence of significant intimal thickening or atherosclerotic plaque affecting the interrogated portions of the left carotid system. There are no elevated peak systolic velocities within the interrogated course of the left internal carotid artery to suggest a hemodynamically significant stenosis. LEFT VERTEBRAL ARTERY:  Antegrade Flow IMPRESSION: Unremarkable carotid Doppler ultrasound. Electronically Signed   By: Sandi Mariscal M.D.   On: 07/18/2015 17:33   Note: Imaging results reviewed.          Meds  The patient has a current medication list which includes the following prescription(s): aspirin ec, atorvastatin, bupropion, cetirizine, fluticasone, hydrocodone-acetaminophen, hydrocodone-acetaminophen, hydrocodone-acetaminophen, lamotrigine, lorazepam, metaxalone, metformin, montelukast, pantoprazole, paroxetine, spironolactone, and vyvanse.  Current Outpatient Prescriptions on File Prior to Visit  Medication Sig  . aspirin EC 81 MG tablet Take 1 tablet (81 mg total) by mouth daily.  Marland Kitchen atorvastatin (LIPITOR) 10 MG tablet Take 1 tablet (10 mg total) by mouth daily.  Marland Kitchen buPROPion (WELLBUTRIN XL) 300 MG 24 hr tablet Take 300 mg by mouth daily.  . cetirizine (ZYRTEC)  10 MG tablet Take 10 mg by mouth at bedtime.  . fluticasone (FLONASE) 50 MCG/ACT nasal spray Place 2 sprays into both nostrils daily as needed for rhinitis.  Marland Kitchen lamoTRIgine (LAMICTAL) 200 MG tablet Take 300 mg by mouth at bedtime.   Marland Kitchen LORazepam (ATIVAN) 1 MG tablet Take 0.5-1 mg by mouth every 8 (eight) hours as needed for anxiety or sleep.   . metFORMIN (GLUCOPHAGE) 500 MG tablet take 1 tablet by mouth once daily with BREAKFAST  . montelukast (SINGULAIR) 10 MG tablet Take 1 tablet (10 mg total) by mouth at bedtime.  . pantoprazole (PROTONIX) 40 MG tablet Take 1 tablet (40 mg total) by mouth daily.  Marland Kitchen PARoxetine (PAXIL-CR) 25 MG 24 hr tablet Take 50 mg by mouth at bedtime.    Marland Kitchen spironolactone (ALDACTONE) 25 MG tablet Take 1 tablet (25 mg total) by mouth daily.  Marland Kitchen VYVANSE 30 MG capsule Take 30 mg by mouth daily.    No current facility-administered medications on file prior to visit.    ROS  Constitutional: Denies any fever or chills Gastrointestinal: No reported hemesis, hematochezia, vomiting, or acute GI distress Musculoskeletal: Denies any acute onset joint swelling, redness, loss of ROM, or weakness Neurological: No reported episodes of acute onset apraxia, aphasia, dysarthria, agnosia, amnesia, paralysis, loss of coordination, or loss of consciousness  Allergies  Ms. Petitjean is allergic to oxycodone; penicillins; and sulfa antibiotics.  [place]  Drug: Ms. Howald  reports that she does not use drugs. Alcohol:  reports that she does not drink alcohol. Tobacco:  reports that she has never smoked. She has never used smokeless tobacco. Medical:  has a past medical history of Anxiety; Asthma; Bulging lumbar disc (L3-4) (02/26/2015); Degenerative disc disease; Degenerative lumbar disc; Depression; Diabetes mellitus; Elective abortion; GERD (gastroesophageal reflux disease); Hypertension; Stroke [facility]) (06/11/2015); and Vaginal delivery. Family: family history includes Asthma in her maternal grandmother; Diabetes in her father; Hypertension in her father and mother; Stroke in her maternal grandfather and maternal grandmother.  Past Surgical History:  Procedure Laterality Date  . CHOLECYSTECTOMY  2001  . DILATATION & CURETTAGE/HYSTEROSCOPY WITH MYOSURE N/A 09/10/2014   Procedure: DILATATION & CURETTAGE/HYSTEROSCOPY WITH MYOSURE/INSERTION OF IUD;  Surgeon: Terrance Mass, MD;  Location: [facility];  Service: Gynecology;  Laterality: N/A;  . Lapband  04/2012  . WISDOM TOOTH EXTRACTION     Constitutional Exam  General appearance: Well nourished, well developed, and well hydrated. In no apparent acute distress Vitals:   01/30/16 0932  BP: (!) 141/84  Pulse: 91  Resp: 16   Temp: 97.9 F (36.6 C)  TempSrc: Oral  SpO2: 99%  Weight: 246 lb (111.6 kg)  Height: 5' 4.5" (1.638 m)   BMI Assessment: Estimated body mass index is 41.57 kg/m as calculated from the following:   Height as of this encounter: 5' 4.5" (1.638 m).   Weight as of this encounter: 246 lb (111.6 kg).  BMI interpretation table: BMI level Category Range association with higher incidence of chronic pain  <18 kg/m2 Underweight   18.5-24.9 kg/m2 Ideal body weight   25-29.9 kg/m2 Overweight Increased incidence by 20%  30-34.9 kg/m2 Obese (Class I) Increased incidence by 68%  35-39.9 kg/m2 Severe obesity (Class II) Increased incidence by 136%  >40 kg/m2 Extreme obesity (Class III) Increased incidence by 254%   BMI Readings from Last 4 Encounters:  01/30/16 41.57 kg/m  11/21/15 43.08 kg/m  11/10/15 44.63 kg/m  09/18/15 42.25 kg/m   Wt Readings from Last 4 Encounters:  01/30/16  246 lb (111.6 kg)  11/21/15 251 lb (113.9 kg)  11/10/15 260 lb (117.9 kg)  09/18/15 250 lb (113.4 kg)  Psych/Mental status: Alert, oriented x 3 (person, place, & time) Eyes: PERLA Respiratory: No evidence of acute respiratory distress  Cervical Spine Exam  Inspection: No masses, redness, or swelling Alignment: Symmetrical Functional ROM: Unrestricted ROM Stability: No instability detected Muscle strength & Tone: Functionally intact Sensory: Unimpaired Palpation: Non-contributory  Upper Extremity (UE) Exam    Side: Right upper extremity  Side: Left upper extremity  Inspection: No masses, redness, swelling, or asymmetry  Inspection: No masses, redness, swelling, or asymmetry  Functional ROM: Unrestricted ROM          Functional ROM: Unrestricted ROM          Muscle strength & Tone: Functionally intact  Muscle strength & Tone: Functionally intact  Sensory: Unimpaired  Sensory: Unimpaired  Palpation: Non-contributory  Palpation: Non-contributory   Thoracic Spine Exam  Inspection: No masses, redness, or  swelling Alignment: Symmetrical Functional ROM: Unrestricted ROM Stability: No instability detected Sensory: Unimpaired Muscle strength & Tone: Functionally intact Palpation: Non-contributory  Lumbar Spine Exam  Inspection: No masses, redness, or swelling Alignment: Symmetrical Functional ROM: Unrestricted ROM Stability: No instability detected Muscle strength & Tone: Functionally intact Sensory: Unimpaired Palpation: Non-contributory Provocative Tests: Lumbar Hyperextension and rotation test: evaluation deferred today       Patrick's Maneuver: evaluation deferred today              Gait & Posture Assessment  Ambulation: Unassisted Gait: Relatively normal for age and body habitus Posture: WNL   Lower Extremity Exam    Side: Right lower extremity  Side: Left lower extremity  Inspection: No masses, redness, swelling, or asymmetry  Inspection: No masses, redness, swelling, or asymmetry  Functional ROM: Unrestricted ROM          Functional ROM: Unrestricted ROM          Muscle strength & Tone: Functionally intact  Muscle strength & Tone: Functionally intact  Sensory: Unimpaired  Sensory: Unimpaired  Palpation: Non-contributory  Palpation: Non-contributory   Assessment  Primary Diagnosis & Pertinent Problem List: The primary encounter diagnosis was Long term prescription opiate use. Diagnoses of Chronic pain syndrome, Opiate use (10 MME/Day), Chronic low back pain (Location of Primary Source of Pain) (Bilateral) (L>R), Chronic lower extremity pain (Location of Secondary source of pain) (Left), Chronic neck pain (Location of Tertiary source of pain) (Bilateral) (L>R), Muscle spasm, nocturnal, Musculoskeletal pain, and Morbid obesity with BMI of 40.0-44.9, adult (HCC) were also pertinent to this visit.  Visit Diagnosis: 1. Long term prescription opiate use   2. Chronic pain syndrome   3. Opiate use (10 MME/Day)   4. Chronic low back pain (Location of Primary Source of Pain)  (Bilateral) (L>R)   5. Chronic lower extremity pain (Location of Secondary source of pain) (Left)   6. Chronic neck pain (Location of Tertiary source of pain) (Bilateral) (L>R)   7. Muscle spasm, nocturnal   8. Musculoskeletal pain   9. Morbid obesity with BMI of 40.0-44.9, adult ([place])    Plan of Care  Pharmacotherapy (Medications Ordered): Meds ordered this encounter  Medications  . HYDROcodone-acetaminophen (NORCO/VICODIN) 5-325 MG tablet    Sig: Take 0.5-1 tablets by mouth 2 (two) times daily as needed for severe pain.    Dispense:  60 tablet    Refill:  0    Do not add this medication to the electronic "Automatic Refill" notification system. Patient may  have prescription filled one day early if pharmacy is closed on scheduled refill date. Do not fill until: 02/13/16 To last until: 03/14/16  . HYDROcodone-acetaminophen (NORCO/VICODIN) 5-325 MG tablet    Sig: Take 0.5-1 tablets by mouth 2 (two) times daily as needed for severe pain.    Dispense:  60 tablet    Refill:  0    Do not add this medication to the electronic "Automatic Refill" notification system. Patient may have prescription filled one day early if pharmacy is closed on scheduled refill date. Do not fill until: 03/14/16 To last until: 04/13/16  . HYDROcodone-acetaminophen (NORCO/VICODIN) 5-325 MG tablet    Sig: Take 0.5-1 tablets by mouth 2 (two) times daily as needed for severe pain.    Dispense:  60 tablet    Refill:  0    Do not add this medication to the electronic "Automatic Refill" notification system. Patient may have prescription filled one day early if pharmacy is closed on scheduled refill date. Do not fill until: 04/13/16 To last until: 05/14/16  . metaxalone (SKELAXIN) 800 MG tablet    Sig: Take 1 tablet (800 mg total) by mouth 3 (three) times daily as needed for muscle spasms.    Dispense:  90 tablet    Refill:  2    Do not add this medication to the electronic "Automatic Refill" notification system.  Patient may have prescription filled one day early if pharmacy is closed on scheduled refill date.   New Prescriptions   No medications on file   Medications administered today: Ms. Borgwardt had no medications administered during this visit. Lab-work, procedure(s), and/or referral(s): No orders of the defined types were placed in this encounter.  Imaging and/or referral(s): None  Interventional therapies: Planned, scheduled, and/or pending:   None at this time secondary to the patient's body habitus    Considering:   Diagnostic left L3-4 lumbar epidural steroid injection under fluoroscopic guidance, with or without sedation.  Diagnostic bilateral lumbar facet block under fluoroscopic guidance and IV sedation.  Possible bilateral lumbar facet radiofrequency ablation.  Diagnostic left-sided cervical epidural steroid injection under fluoroscopic guidance, with or without sedation.  Diagnostic bilateral cervical facet block under fluoroscopic guidance and IV sedation.  Possible bilateral cervical facet radiofrequency ablation.    Palliative PRN treatment(s):   Diagnostic left L3-4 lumbar epidural steroid injection under fluoroscopic guidance, with or without sedation.  Diagnostic bilateral lumbar facet block under fluoroscopic guidance and IV sedation.  Diagnostic left-sided cervical epidural steroid injection under fluoroscopic guidance, with or without sedation.  Diagnostic bilateral cervical facet block under fluoroscopic guidance and IV sedation.    Provider-requested follow-up: Return in about 3 months (around 04/29/2016) for Med-Mgmt.  Future Appointments Date Time Provider [place]  04/29/2016 8:15 AM Milinda Pointer, MD [facility] None  05/24/2016 9:00 AM Pieter Partridge, DO [facility] None   Primary Care Physician: Lucille Passy, MD Location: [facility] Note by: Kathlen Brunswick. Dossie Arbour, M.D, DABA, DABAPM, DABPM, DABIPP, FIPP Date: 01/30/16; Time:  1:14 PM  Pain Score Disclaimer: We use the NRS-11 scale. This is a self-reported, subjective measurement of pain severity with only modest accuracy. It is used primarily to identify changes within a particular patient. It must be understood that outpatient pain scales are significantly less accurate that those used for research, where they can be applied under ideal controlled circumstances with minimal exposure to variables. In reality, the score is likely to be a combination of pain intensity and pain affect,  where pain affect describes the degree of emotional arousal or changes in action readiness caused by the sensory experience of pain. Factors such as social and work situation, setting, emotional state, anxiety levels, expectation, and prior pain experience may influence pain perception and show large inter-individual differences that may also be affected by time variables.  Patient instructions provided during this appointment: There are no Patient Instructions on file for this visit.

## 2016-01-30 NOTE — Progress Notes (Signed)
Nursing Pain Medication Assessment:  Safety precautions to be maintained throughout the outpatient stay will include: orient to surroundings, keep bed in low position, maintain call bell within reach at all times, provide assistance with transfer out of bed and ambulation.  Medication Inspection Compliance: Pill count conducted under aseptic conditions, in front of the patient. Neither the pills nor the bottle was removed from the patient's sight at any time. Once count was completed pills were immediately returned to the patient in their original bottle.  Medication: See above Pill Count: 19 of 60 pills remain Bottle Appearance: Standard pharmacy container. Clearly labeled. Filled Date: 2811 / 15 / 2017 Medication last intake: 01/30/16

## 2016-02-02 ENCOUNTER — Encounter: Payer: Self-pay | Admitting: Pain Medicine

## 2016-04-04 ENCOUNTER — Encounter (HOSPITAL_COMMUNITY): Payer: Self-pay | Admitting: Emergency Medicine

## 2016-04-04 ENCOUNTER — Emergency Department (HOSPITAL_COMMUNITY)
Admission: EM | Admit: 2016-04-04 | Discharge: 2016-04-05 | Disposition: A | Discharge status: Home / Self Care | Payer: Managed Care, Other (non HMO) | Attending: Emergency Medicine | Admitting: Emergency Medicine

## 2016-04-04 DIAGNOSIS — J45909 Unspecified asthma, uncomplicated: Secondary | ICD-10-CM | POA: Diagnosis not present

## 2016-04-04 DIAGNOSIS — E119 Type 2 diabetes mellitus without complications: Secondary | ICD-10-CM | POA: Insufficient documentation

## 2016-04-04 DIAGNOSIS — Z79899 Other long term (current) drug therapy: Secondary | ICD-10-CM | POA: Diagnosis not present

## 2016-04-04 DIAGNOSIS — T7840XA Allergy, unspecified, initial encounter: Secondary | ICD-10-CM | POA: Diagnosis not present

## 2016-04-04 DIAGNOSIS — Z8673 Personal history of transient ischemic attack (TIA), and cerebral infarction without residual deficits: Secondary | ICD-10-CM | POA: Diagnosis not present

## 2016-04-04 DIAGNOSIS — I10 Essential (primary) hypertension: Secondary | ICD-10-CM | POA: Diagnosis not present

## 2016-04-04 DIAGNOSIS — Z7984 Long term (current) use of oral hypoglycemic drugs: Secondary | ICD-10-CM | POA: Diagnosis not present

## 2016-04-04 DIAGNOSIS — Z7982 Long term (current) use of aspirin: Secondary | ICD-10-CM | POA: Diagnosis not present

## 2016-04-04 LAB — CBC WITH DIFFERENTIAL/PLATELET
BASOS ABS: 0 10*3/uL (ref 0.0–0.1)
BASOS PCT: 0 %
EOS PCT: 3 %
Eosinophils Absolute: 0.3 10*3/uL (ref 0.0–0.7)
HEMATOCRIT: 36.8 % (ref 36.0–46.0)
Hemoglobin: 11.8 g/dL — ABNORMAL LOW (ref 12.0–15.0)
LYMPHS PCT: 30 %
Lymphs Abs: 3.3 10*3/uL (ref 0.7–4.0)
MCH: 26.8 pg (ref 26.0–34.0)
MCHC: 32.1 g/dL (ref 30.0–36.0)
MCV: 83.6 fL (ref 78.0–100.0)
Monocytes Absolute: 0.5 10*3/uL (ref 0.1–1.0)
Monocytes Relative: 4 %
NEUTROS ABS: 7 10*3/uL (ref 1.7–7.7)
Neutrophils Relative %: 63 %
PLATELETS: 281 10*3/uL (ref 150–400)
RBC: 4.4 MIL/uL (ref 3.87–5.11)
RDW: 14.4 % (ref 11.5–15.5)
WBC: 11.1 10*3/uL — AB (ref 4.0–10.5)

## 2016-04-04 LAB — BASIC METABOLIC PANEL
ANION GAP: 10 (ref 5–15)
BUN: 12 mg/dL (ref 6–20)
CO2: 23 mmol/L (ref 22–32)
Calcium: 9.2 mg/dL (ref 8.9–10.3)
Chloride: 104 mmol/L (ref 101–111)
Creatinine, Ser: 0.88 mg/dL (ref 0.44–1.00)
GFR calc Af Amer: >60 mL/min (ref >60)
Glucose, Bld: 98 mg/dL (ref 65–99)
POTASSIUM: 4.3 mmol/L (ref 3.5–5.1)
SODIUM: 137 mmol/L (ref 135–145)

## 2016-04-04 MED ORDER — FAMOTIDINE IN NACL 20-0.9 MG/50ML-% IV SOLN
20.0000 mg | Freq: Once | INTRAVENOUS | Status: AC
  Administered 2016-04-04: 20 mg via INTRAVENOUS
  Filled 2016-04-04: qty 50

## 2016-04-04 MED ORDER — METHYLPREDNISOLONE SODIUM SUCC 125 MG IJ SOLR
125.0000 mg | Freq: Once | INTRAMUSCULAR | Status: AC
  Administered 2016-04-04: 125 mg via INTRAVENOUS
  Filled 2016-04-04: qty 2

## 2016-04-04 MED ORDER — DIPHENHYDRAMINE HCL 50 MG/ML IJ SOLN
12.5000 mg | Freq: Once | INTRAMUSCULAR | Status: AC
  Administered 2016-04-04: 12.5 mg via INTRAVENOUS
  Filled 2016-04-04: qty 1

## 2016-04-04 NOTE — ED Provider Notes (Signed)
[facility]-EMERGENCY DEPT Provider Note   CSN: [id number] Arrival date & time: 04/04/16  2210     History   Chief Complaint Chief Complaint  Patient presents with  . Allergic Reaction    HPI Debra Myers is a 51 y.o. female.  The history is provided by the patient, the spouse and medical records. No language interpreter was used.   Debra Myers is a 51 y.o. female  with a PMH of asthma, anxiety, DM, TIA, HTN, chronic back pain who presents to the Emergency Department complaining of persistent allergic reaction which began two hours prior to arrival. Patient states that she was eating a Mediterranean salad from [employer] and shortly after felt her lips tingling. She then felt as if the tingling was starting to move toward the back of her throat. She describes the feeling in her throat as a "thickness". She has no known food allergies. She does have an allergy to oxycodone and pcn as well as seasonal allergies. She has taken no medications prior to arrival for her symptoms. She denies any lip or tongue swelling. She denies difficulty swallowing or shortness of breath. No rashes, muscle weakness, slurred speech, fever/chills.    Past Medical History:  Diagnosis Date  . Anxiety   . Asthma    ALLERGY INDUCED  . Bulging lumbar disc (L3-4) 02/26/2015  . Degenerative disc disease   . Degenerative lumbar disc   . Depression   . Diabetes mellitus    TYPE 2  . Elective abortion    ONE  . GERD (gastroesophageal reflux disease)    takes prilosec   . Hypertension   . Stroke (HCC) 06/11/2015   patient describes as mini stroke  . Vaginal delivery    ONE NSVD    Patient Active Problem List   Diagnosis Date Noted  . Chronic pain syndrome 01/30/2016  . Morbid obesity with BMI of 40.0-44.9, adult (HCC) 01/30/2016  . Muscle spasm, nocturnal 08/05/2015  . Musculoskeletal pain 08/05/2015  . Carpal tunnel syndrome (Bilateral) (L>R) 08/05/2015  . Cervical facet syndrome (Location of Tertiary  source of pain) (Bilateral) (L>R) 08/05/2015  . TIA (transient ischemic attack) 06/12/2015  . Cervical spondylosis (C5-6 & C6-7 DDD) 03/04/2015  . Cervical (3 mm) Grade 1 Anterolisthesis of C4 over C5 03/04/2015  . Chronic neck pain (Location of Tertiary source of pain) (Bilateral) (L>R) 03/04/2015  . Chronic cervical radicular pain (Bilateral) (L>R) 03/04/2015  . Cervical foraminal stenosis (multilevel) (Bilateral) 03/04/2015  . Long term current use of opiate analgesic 02/26/2015  . Encounter for therapeutic drug level monitoring 02/26/2015  . Encounter for chronic pain management 02/26/2015  . Numbness of upper extremity 02/26/2015  . Radiculitis involving upper extremity 02/26/2015  . Chronic low back pain (Location of Primary Source of Pain) (Bilateral) (L>R) 02/26/2015  . Lumbar spondylosis (Bulging Disc & Severe Left Foraminal Stenosis at L3-4) 02/26/2015  . Chronic lower extremity pain (Location of Secondary source of pain) (Left) 02/26/2015  . Lumbar foraminal stenosis (Severe Left L3-4) 02/26/2015  . Lumbar facet syndrome (Location of Primary Source of Pain) (Bilateral) (L>R) 02/26/2015  . Opiate use (10 MME/Day) 12/02/2014  . Long term prescription opiate use 12/02/2014  . IUD (intrauterine device) in place 10/01/2014  . GERD (gastroesophageal reflux disease) 01/30/2014  . Overweight(278.02) 02/14/2012  . Hypertension 07/09/2011  . HYPERTRIGLYCERIDEMIA 12/27/2008  . GOITER, NONTOXIC MULTINODULAR 12/14/2006  . Diabetes (HCC) 12/14/2006  . POLYCYSTIC OVARIES 12/14/2006  . Metabolic Syndrome X 12/14/2006  . OBESITY 12/14/2006  .  Anxiety 12/14/2006  . Allergic rhinitis 12/14/2006  . Asthma 12/14/2006  . HIRSUTISM 12/14/2006  . Generalized anxiety disorder 12/14/2006    Past Surgical History:  Procedure Laterality Date  . CHOLECYSTECTOMY  2001  . DILATATION & CURETTAGE/HYSTEROSCOPY WITH MYOSURE N/A 09/10/2014   Procedure: DILATATION & CURETTAGE/HYSTEROSCOPY WITH  MYOSURE/INSERTION OF IUD;  Surgeon: Ok EdwardsJuan H Fernandez, MD;  Location: [facility];  Service: Gynecology;  Laterality: N/A;  . Lapband  04/2012  . WISDOM TOOTH EXTRACTION      OB History    Gravida Para Term Preterm AB Living   2 1 1   1 1    SAB TAB Ectopic Multiple Live Births   1       1       Home Medications    Prior to Admission medications   Medication Sig Start Date End Date Taking? Authorizing Provider  aspirin EC 81 MG tablet Take 1 tablet (81 mg total) by mouth daily. 06/12/15  Yes Enedina FinnerSona Patel, MD  atorvastatin (LIPITOR) 10 MG tablet Take 1 tablet (10 mg total) by mouth daily. 11/21/15  Yes Adam Mliss Fritz Jaffe, DO  buPROPion (WELLBUTRIN XL) 300 MG 24 hr tablet Take 300 mg by mouth daily.   Yes Historical Provider, MD  cetirizine (ZYRTEC) 10 MG tablet Take 10 mg by mouth at bedtime.   Yes Historical Provider, MD  fluticasone (FLONASE) 50 MCG/ACT nasal spray Place 2 sprays into both nostrils daily as needed for rhinitis. 09/18/15  Yes Dianne Dunalia M Aron, MD  HYDROcodone-acetaminophen (NORCO/VICODIN) 5-325 MG tablet Take 0.5-1 tablets by mouth 2 (two) times daily as needed for severe pain. 03/14/16 04/13/16 Yes Delano MetzFrancisco Naveira, MD  lamoTRIgine (LAMICTAL) 200 MG tablet Take 300 mg by mouth at bedtime.    Yes Historical Provider, MD  LORazepam (ATIVAN) 1 MG tablet Take 0.5-1 mg by mouth every 8 (eight) hours as needed for anxiety or sleep.    Yes Historical Provider, MD  metaxalone (SKELAXIN) 800 MG tablet Take 1 tablet (800 mg total) by mouth 3 (three) times daily as needed for muscle spasms. 02/13/16 05/13/16 Yes Delano MetzFrancisco Naveira, MD  metFORMIN (GLUCOPHAGE) 500 MG tablet take 1 tablet by mouth once daily with BREAKFAST 09/18/15  Yes Dianne Dunalia M Aron, MD  montelukast (SINGULAIR) 10 MG tablet Take 1 tablet (10 mg total) by mouth at bedtime. 09/18/15  Yes Dianne Dunalia M Aron, MD  pantoprazole (PROTONIX) 40 MG tablet Take 1 tablet (40 mg total) by mouth daily. 10/31/15  Yes Faythe GheeSusan W Fisher, PA-C  PARoxetine (PAXIL-CR) 25 MG  24 hr tablet Take 50 mg by mouth at bedtime.    Yes Historical Provider, MD  spironolactone (ALDACTONE) 25 MG tablet Take 1 tablet (25 mg total) by mouth daily. 09/18/15  Yes Dianne Dunalia M Aron, MD  VYVANSE 30 MG capsule Take 30 mg by mouth daily.  09/15/15  Yes Historical Provider, MD  HYDROcodone-acetaminophen (NORCO/VICODIN) 5-325 MG tablet Take 0.5-1 tablets by mouth 2 (two) times daily as needed for severe pain. 02/13/16 03/14/16  Delano MetzFrancisco Naveira, MD  HYDROcodone-acetaminophen (NORCO/VICODIN) 5-325 MG tablet Take 0.5-1 tablets by mouth 2 (two) times daily as needed for severe pain. Patient not taking: Reported on 04/04/2016 04/13/16 05/13/16  Delano MetzFrancisco Naveira, MD    Family History Family History  Problem Relation Age of Onset  . Hypertension Father   . Diabetes Father   . Hypertension Mother   . Asthma Maternal Grandmother   . Stroke Maternal Grandmother   . Stroke Maternal Grandfather     Social  History Social History  Substance Use Topics  . Smoking status: Never Smoker  . Smokeless tobacco: Never Used  . Alcohol use No     Comment: rarely     Allergies   Oxycodone; Penicillins; and Sulfa antibiotics   Review of Systems Review of Systems  Constitutional: Negative for chills and fever.  HENT: Negative for facial swelling and trouble swallowing.   Eyes: Negative for visual disturbance.  Respiratory: Negative for cough and shortness of breath.   Cardiovascular: Negative.   Gastrointestinal: Negative for abdominal pain, nausea and vomiting.  Musculoskeletal: Negative for neck pain.  Skin: Negative for rash.  Allergic/Immunologic: Positive for environmental allergies. Negative for food allergies.  Neurological: Negative for speech difficulty, weakness and headaches.       + lip tingling     Physical Exam Updated Vital Signs BP 148/80   Pulse 99   Resp 21   Ht 5\' 4"  (1.626 m)   Wt 117.9 kg   SpO2 100%   BMI 44.63 kg/m   Physical Exam  Constitutional: She is  oriented to person, place, and time. She appears well-developed and well-nourished. No distress.  Tearful, anxious but speaking in full sentences.  HENT:  Head: Normocephalic and atraumatic.  Mild tonsillar hypertrophy. Airway patent.   Neck: Normal range of motion. Neck supple.  No tenderness.   Cardiovascular: Normal rate, regular rhythm and normal heart sounds.   No murmur heard. Pulmonary/Chest: Effort normal and breath sounds normal. No respiratory distress. She has no wheezes. She has no rales. She exhibits no tenderness.  Lungs clear to auscultation.   Abdominal: Soft. She exhibits no distension. There is no tenderness.  Neurological: She is alert and oriented to person, place, and time.  5/5 muscle strength in all four extremities.  Clear goal oriented speech.  CN II-XII grossly intact.   Skin: Skin is warm and dry.  Nursing note and vitals reviewed.    ED Treatments / Results  Labs (all labs ordered are listed, but only abnormal results are displayed) Labs Reviewed  CBC WITH DIFFERENTIAL/PLATELET - Abnormal; Notable for the following:       Result Value   WBC 11.1 (*)    Hemoglobin 11.8 (*)    All other components within normal limits  BASIC METABOLIC PANEL    EKG  EKG Interpretation  Date/Time:  Sunday April 04 2016 22:17:38 EST Ventricular Rate:  101 PR Interval:    QRS Duration: 94 QT Interval:  343 QTC Calculation: 445 R Axis:   69 Text Interpretation:  Sinus tachycardia Atrial premature complex since last tracing no significant change Confirmed by BELFI  MD, MELANIE ([id number]) on 04/04/2016 11:12:08 PM       Radiology No results found.  Procedures Procedures (including critical care time)  Medications Ordered in ED Medications  methylPREDNISolone sodium succinate (SOLU-MEDROL) 125 mg/2 mL injection 125 mg (125 mg Intravenous Given 04/04/16 2246)  famotidine (PEPCID) IVPB 20 mg premix (0 mg Intravenous Stopped 04/04/16 2343)  diphenhydrAMINE  (BENADRYL) injection 12.5 mg (12.5 mg Intravenous Given 04/04/16 2240)     Initial Impression / Assessment and Plan / ED Course  I have reviewed the triage vital signs and the nursing notes.  Pertinent labs & imaging results that were available during my care of the patient were reviewed by me and considered in my medical decision making (see chart for details).    KRISHAUNA SCHATZMAN is a 51 y.o. female who presents to ED for possible allergic reaction shortly after  eating a [employer] salad. Hx of prior TIA. No neuro deficits on exam. Does appear c/w allergic reaction. Airway intact. No oral/facial swelling appreciated. Lungs CTA bilaterally. Solu-medrol, benadryl, pepcid given. On re-evaluation, patient feels much improved. Will continue to observe.   Patient again re-evaluated. Symptoms now resolved. She feels comfortable with discharge to home. Home care instructions discussed. Return to ER if symptoms return. All questions answered.   Patient seen by and discussed with Dr. Fredderick Phenix who agrees with treatment plan.    Final Clinical Impressions(s) / ED Diagnoses   Final diagnoses:  Allergic reaction, initial encounter    New Prescriptions New Prescriptions   No medications on file     [facility], PA-C 04/05/16 1610    Rolan Bucco, MD 04/05/16 1416

## 2016-04-04 NOTE — ED Triage Notes (Signed)
Pt having an allergic reaction, states she ate on [employer] a salad and started having a allergic reaction with swollen on her mouth pat able to talk with some horse voice.

## 2016-04-04 NOTE — ED Notes (Signed)
The pt is feeling better

## 2016-04-05 NOTE — Discharge Instructions (Signed)
Benadryl as needed. If symptoms return and are not resolved with benadryl, please return to the nearest Emergency Department immediately.

## 2016-04-14 ENCOUNTER — Ambulatory Visit: Payer: Self-pay | Admitting: Physician Assistant

## 2016-04-14 ENCOUNTER — Encounter: Payer: Self-pay | Admitting: Physician Assistant

## 2016-04-14 VITALS — BP 154/80 | HR 106 | Temp 98.7°F

## 2016-04-14 DIAGNOSIS — Z7189 Other specified counseling: Secondary | ICD-10-CM

## 2016-04-14 NOTE — Progress Notes (Signed)
S: c/o having a couple of episodes where her tongue and lips tingle, went to the ER and thought it was an allergic reaction, no hives, no swelling, no sob, no v/d; no new medications, states similar sx in the past with a mini stroke, stopped taking her lipitor bc she read it can cause pain and already has pain in her back/knees, had not rouble for months on this medication, was given the medication after her ministroke, was seen by neurolgy afterwards, wants to get a second opinion so [place]; vitals wnl elevated bp, neuro intact  A: medication interaction?, med counseling  P: pt to f/u with neurology and psychiatrist, ?seratonin reaction or stroke like when sx happen, encouraged her to stay on lipitor

## 2016-04-29 ENCOUNTER — Ambulatory Visit: Payer: Managed Care, Other (non HMO) | Attending: Pain Medicine | Admitting: Pain Medicine

## 2016-04-29 ENCOUNTER — Encounter: Payer: Self-pay | Admitting: Pain Medicine

## 2016-04-29 VITALS — BP 132/75 | HR 101 | Temp 98.5°F | Resp 16 | Ht 64.0 in | Wt 255.0 lb

## 2016-04-29 DIAGNOSIS — K219 Gastro-esophageal reflux disease without esophagitis: Secondary | ICD-10-CM | POA: Insufficient documentation

## 2016-04-29 DIAGNOSIS — J45909 Unspecified asthma, uncomplicated: Secondary | ICD-10-CM | POA: Insufficient documentation

## 2016-04-29 DIAGNOSIS — M4722 Other spondylosis with radiculopathy, cervical region: Secondary | ICD-10-CM | POA: Insufficient documentation

## 2016-04-29 DIAGNOSIS — Z975 Presence of (intrauterine) contraceptive device: Secondary | ICD-10-CM | POA: Insufficient documentation

## 2016-04-29 DIAGNOSIS — Z7982 Long term (current) use of aspirin: Secondary | ICD-10-CM | POA: Insufficient documentation

## 2016-04-29 DIAGNOSIS — F1190 Opioid use, unspecified, uncomplicated: Secondary | ICD-10-CM | POA: Diagnosis not present

## 2016-04-29 DIAGNOSIS — G894 Chronic pain syndrome: Secondary | ICD-10-CM

## 2016-04-29 DIAGNOSIS — F329 Major depressive disorder, single episode, unspecified: Secondary | ICD-10-CM | POA: Diagnosis not present

## 2016-04-29 DIAGNOSIS — M62838 Other muscle spasm: Secondary | ICD-10-CM | POA: Insufficient documentation

## 2016-04-29 DIAGNOSIS — M791 Myalgia: Secondary | ICD-10-CM | POA: Diagnosis not present

## 2016-04-29 DIAGNOSIS — Z833 Family history of diabetes mellitus: Secondary | ICD-10-CM | POA: Diagnosis not present

## 2016-04-29 DIAGNOSIS — M545 Low back pain: Secondary | ICD-10-CM

## 2016-04-29 DIAGNOSIS — Z7951 Long term (current) use of inhaled steroids: Secondary | ICD-10-CM | POA: Diagnosis not present

## 2016-04-29 DIAGNOSIS — M488X2 Other specified spondylopathies, cervical region: Secondary | ICD-10-CM | POA: Diagnosis not present

## 2016-04-29 DIAGNOSIS — M488X6 Other specified spondylopathies, lumbar region: Secondary | ICD-10-CM | POA: Diagnosis not present

## 2016-04-29 DIAGNOSIS — Z8673 Personal history of transient ischemic attack (TIA), and cerebral infarction without residual deficits: Secondary | ICD-10-CM | POA: Diagnosis not present

## 2016-04-29 DIAGNOSIS — Z882 Allergy status to sulfonamides status: Secondary | ICD-10-CM | POA: Insufficient documentation

## 2016-04-29 DIAGNOSIS — M5136 Other intervertebral disc degeneration, lumbar region: Secondary | ICD-10-CM | POA: Diagnosis not present

## 2016-04-29 DIAGNOSIS — E119 Type 2 diabetes mellitus without complications: Secondary | ICD-10-CM | POA: Diagnosis not present

## 2016-04-29 DIAGNOSIS — Z880 Allergy status to penicillin: Secondary | ICD-10-CM | POA: Insufficient documentation

## 2016-04-29 DIAGNOSIS — M79605 Pain in left leg: Secondary | ICD-10-CM

## 2016-04-29 DIAGNOSIS — M1288 Other specific arthropathies, not elsewhere classified, other specified site: Secondary | ICD-10-CM | POA: Diagnosis not present

## 2016-04-29 DIAGNOSIS — Z885 Allergy status to narcotic agent status: Secondary | ICD-10-CM | POA: Insufficient documentation

## 2016-04-29 DIAGNOSIS — Z6841 Body Mass Index (BMI) 40.0 and over, adult: Secondary | ICD-10-CM | POA: Diagnosis not present

## 2016-04-29 DIAGNOSIS — Z8249 Family history of ischemic heart disease and other diseases of the circulatory system: Secondary | ICD-10-CM | POA: Insufficient documentation

## 2016-04-29 DIAGNOSIS — I10 Essential (primary) hypertension: Secondary | ICD-10-CM | POA: Diagnosis not present

## 2016-04-29 DIAGNOSIS — M4312 Spondylolisthesis, cervical region: Secondary | ICD-10-CM | POA: Insufficient documentation

## 2016-04-29 DIAGNOSIS — M7918 Myalgia, other site: Secondary | ICD-10-CM

## 2016-04-29 DIAGNOSIS — Z7984 Long term (current) use of oral hypoglycemic drugs: Secondary | ICD-10-CM | POA: Diagnosis not present

## 2016-04-29 DIAGNOSIS — G8929 Other chronic pain: Secondary | ICD-10-CM

## 2016-04-29 DIAGNOSIS — M48061 Spinal stenosis, lumbar region without neurogenic claudication: Secondary | ICD-10-CM | POA: Insufficient documentation

## 2016-04-29 DIAGNOSIS — Z79891 Long term (current) use of opiate analgesic: Secondary | ICD-10-CM

## 2016-04-29 DIAGNOSIS — M47816 Spondylosis without myelopathy or radiculopathy, lumbar region: Secondary | ICD-10-CM | POA: Insufficient documentation

## 2016-04-29 DIAGNOSIS — Z79899 Other long term (current) drug therapy: Secondary | ICD-10-CM | POA: Insufficient documentation

## 2016-04-29 DIAGNOSIS — E781 Pure hyperglyceridemia: Secondary | ICD-10-CM | POA: Insufficient documentation

## 2016-04-29 DIAGNOSIS — F419 Anxiety disorder, unspecified: Secondary | ICD-10-CM | POA: Diagnosis not present

## 2016-04-29 DIAGNOSIS — Z5181 Encounter for therapeutic drug level monitoring: Secondary | ICD-10-CM | POA: Insufficient documentation

## 2016-04-29 DIAGNOSIS — G5603 Carpal tunnel syndrome, bilateral upper limbs: Secondary | ICD-10-CM | POA: Insufficient documentation

## 2016-04-29 DIAGNOSIS — M47812 Spondylosis without myelopathy or radiculopathy, cervical region: Secondary | ICD-10-CM

## 2016-04-29 MED ORDER — HYDROCODONE-ACETAMINOPHEN 5-325 MG PO TABS: 0.5000 | ORAL_TABLET | Freq: Two times a day (BID) | ORAL | 0 refills | Status: DC | PRN

## 2016-04-29 MED ORDER — METAXALONE 800 MG PO TABS: 800.0000 mg | ORAL_TABLET | Freq: Three times a day (TID) | ORAL | 2 refills | Status: DC | PRN

## 2016-04-29 NOTE — Progress Notes (Signed)
Patient's Name: Debra Myers  MRN: [id number]  Referring Provider: Lucille Passy, MD  DOB: [birth year]  PCP: Lucille Passy, MD  DOS: 04/29/2016  Note by: Kathlen Brunswick. Dossie Arbour, MD  Service setting: Ambulatory outpatient  Specialty: Interventional Pain Management  Location: [facility] (AMB) Pain Management Facility    Patient type: Established   Primary Reason(s) for Visit: Encounter for prescription drug management (Level of risk: moderate) CC: Back Pain (left and low)  HPI  Debra Myers is a 51 y.o. year old, female patient, who comes today for a medication management evaluation. She has GOITER, NONTOXIC MULTINODULAR; Diabetes ([place]); POLYCYSTIC OVARIES; HYPERTRIGLYCERIDEMIA; Metabolic Syndrome X; OBESITY; Anxiety; Allergic rhinitis; Asthma; HIRSUTISM; Generalized anxiety disorder; Hypertension; Overweight(278.02); GERD (gastroesophageal reflux disease); IUD (intrauterine device) in place; Opiate use (10 MME/Day); Long term prescription opiate use; Long term current use of opiate analgesic; Encounter for therapeutic drug level monitoring; Encounter for chronic pain management; Numbness of upper extremity; Radiculitis involving upper extremity; Chronic low back pain (Location of Primary Source of Pain) (Bilateral) (L>R); Lumbar spondylosis (Bulging Disc & Severe Left Foraminal Stenosis at L3-4); Chronic lower extremity pain (Location of Secondary source of pain) (Left); Lumbar foraminal stenosis (Severe Left L3-4); Lumbar facet syndrome (Location of Primary Source of Pain) (Bilateral) (L>R); Cervical spondylosis (C5-6 & C6-7 DDD); Cervical (3 mm) Grade 1 Anterolisthesis of C4 over C5; Chronic neck pain (Location of Tertiary source of pain) (Bilateral) (L>R); Chronic cervical radicular pain (Bilateral) (L>R); Cervical foraminal stenosis (multilevel) (Bilateral); TIA (transient ischemic attack); Muscle spasm, nocturnal; Musculoskeletal pain; Carpal tunnel syndrome (Bilateral) (L>R); Cervical facet syndrome (Location of  Tertiary source of pain) (Bilateral) (L>R); Chronic pain syndrome; and Morbid obesity with BMI of 40.0-44.9, adult ([place]) on her problem list. Her primarily concern today is the Back Pain (left and low)  Pain Assessment: Self-Reported Pain Score: 2 /10             Reported level is compatible with observation.       Pain Location: Back Pain Orientation: Lower, Left Pain Descriptors / Indicators: Aching, Numbness, Spasm Pain Frequency: Intermittent  Debra Myers was last scheduled for an appointment on 01/30/2016 for medication management. During today's appointment we reviewed Debra Myers's chronic pain status, as well as her outpatient medication regimen.  The patient  reports that she does not use drugs. Her body mass index is 43.77 kg/m.  Further details on both, my assessment(s), as well as the proposed treatment plan, please see below.  Controlled Substance Pharmacotherapy Assessment REMS (Risk Evaluation and Mitigation Strategy)  Analgesic:Hydrocodone/APAP 5/325 2 tablets per day (10 mg/day) MME/day:10 mg/day  Angelique Holm, RN  04/29/2016  8:51 AM  Sign at close encounter Nursing Pain Medication Assessment:  Safety precautions to be maintained throughout the outpatient stay will include: orient to surroundings, keep bed in low position, maintain call bell within reach at all times, provide assistance with transfer out of bed and ambulation.  Medication Inspection Compliance: Pill count conducted under aseptic conditions, in front of the patient. Neither the pills nor the bottle was removed from the patient's sight at any time. Once count was completed pills were immediately returned to the patient in their original bottle.  Medication: See above Pill/Patch Count: 5 of 60 pills remain Bottle Appearance: Standard pharmacy container. Clearly labeled. Filled Date: 02 / 02 / 2018 Last Medication intake:  Today  Zenovia Jarred, RN  04/29/2016  8:39 AM  Signed A Lavender aromatherapy sample  has been provided to the patient.  Quantity 1 gtt of 50% diluted lavender. Instructed regarding safe and effective use of essential oil.  Teach back 3 done, consent signed.   Pharmacokinetics: Liberation and absorption (onset of action): WNL Distribution (time to peak effect): WNL Metabolism and excretion (duration of action): WNL         Pharmacodynamics: Desired effects: Analgesia: Debra Myers reports >50% benefit. Functional ability: Patient reports that medication allows her to accomplish basic ADLs Clinically meaningful improvement in function (CMIF): Sustained CMIF goals met Perceived effectiveness: Described as relatively effective, allowing for increase in activities of daily living (ADL) Undesirable effects: Side-effects or Adverse reactions: None reported Monitoring: [place] PMP: Online review of the past 57-monthperiod conducted. Compliant with practice rules and regulations List of all UDS test(s) done:  Lab Results  Component Value Date   TOXASSSELUR FINAL 08/05/2015   TOXASSSELUR FINAL 05/26/2015   TParrishFINAL 02/26/2015   Last UDS on record: ToxAssure Select 13  Date Value Ref Range Status  08/05/2015 FINAL  Final    Comment:    ==================================================================== TOXASSURE SELECT 13 (MW) ==================================================================== Test                             Result       Flag       Units Drug Present and Declared for Prescription Verification   Lorazepam                      925          EXPECTED   ng/mg creat    Source of lorazepam is a scheduled prescription medication.   Hydrocodone                    433          EXPECTED   ng/mg creat   Norhydrocodone                 871          EXPECTED   ng/mg creat    Sources of hydrocodone include scheduled prescription    medications. Norhydrocodone is an expected metabolite of     hydrocodone. ==================================================================== Test                      Result    Flag   Units      Ref Range   Creatinine              52               mg/dL      >=20 ==================================================================== Declared Medications:  The flagging and interpretation on this report are based on the  following declared medications.  Unexpected results may arise from  inaccuracies in the declared medications.  **Note: The testing scope of this panel includes these medications:  Hydrocodone (Hydrocodone-Acetaminophen)  Lorazepam (Ativan)  **Note: The testing scope of this panel does not include following  reported medications:  Acetaminophen (Hydrocodone-Acetaminophen)  Albuterol  Aspirin  Atorvastatin  Bupropion (Wellbutrin)  Cetirizine  Fluticasone (Flonase)  Ibuprofen  Lamotrigine  Metaxalone (Skelaxin)  Metformin  Montelukast (Singulair)  Omeprazole  Paroxetine  Spironolactone (Aldactone) ==================================================================== For clinical consultation, please call [phone] ====================================================================    UDS interpretation: Compliant          Medication Assessment Form: Reviewed. Patient indicates being compliant with therapy Treatment compliance: Compliant Risk Assessment Profile: Aberrant behavior: See  prior evaluations. None observed or detected today Comorbid factors increasing risk of overdose: See prior notes. No additional risks detected today Risk of substance use disorder (SUD): Low Opioid Risk Tool (ORT) Total Score: 7  Interpretation Table:  Score <3 = Low Risk for SUD  Score between 4-7 = Moderate Risk for SUD  Score >8 = High Risk for Opioid Abuse   Risk Mitigation Strategies:  Patient Counseling: Covered Patient-Prescriber Agreement (PPA): Present and active  Notification to other healthcare providers:  Done  Pharmacologic Plan: No change in therapy, at this time  Laboratory Chemistry  Inflammation Markers No results found for: CRP, ESRSEDRATE (CRP: Acute Phase) (ESR: Chronic Phase) Renal Function Markers Lab Results  Component Value Date   BUN 12 04/04/2016   CREATININE 0.88 04/04/2016   GFRAA >60 04/04/2016   GFRNONAA >60 04/04/2016   Hepatic Function Markers Lab Results  Component Value Date   AST 16 10/31/2015   ALT 16 10/31/2015   ALBUMIN 4.4 10/31/2015   ALKPHOS 125 (H) 10/31/2015   HCVAB NEGATIVE 05/18/2013   Electrolytes Lab Results  Component Value Date   NA 137 04/04/2016   K 4.3 04/04/2016   CL 104 04/04/2016   CALCIUM 9.2 04/04/2016   Neuropathy Markers No results found for: [id number] Bone Pathology Markers Lab Results  Component Value Date   ALKPHOS 125 (H) 10/31/2015   VD25OH 44 05/18/2013   CALCIUM 9.2 04/04/2016   Coagulation Parameters Lab Results  Component Value Date   INR 1.10 06/11/2015   LABPROT 14.4 06/11/2015   APTT 33 06/11/2015   PLT 281 04/04/2016   Cardiovascular Markers Lab Results  Component Value Date   HGB 11.8 (L) 04/04/2016   HCT 36.8 04/04/2016   Note: Lab results reviewed.  Recent Diagnostic Imaging Review  No results found. Note: Imaging results reviewed.          Meds  The patient has a current medication list which includes the following prescription(s): aspirin ec, atorvastatin, bupropion, cetirizine, fluticasone, hydrocodone-acetaminophen, hydrocodone-acetaminophen, hydrocodone-acetaminophen, lamotrigine, lisdexamfetamine, lorazepam, metaxalone, metformin, montelukast, pantoprazole, paroxetine, and spironolactone.  Current Outpatient Prescriptions on File Prior to Visit  Medication Sig  . aspirin EC 81 MG tablet Take 1 tablet (81 mg total) by mouth daily.  Marland Kitchen atorvastatin (LIPITOR) 10 MG tablet Take 1 tablet (10 mg total) by mouth daily.  Marland Kitchen buPROPion (WELLBUTRIN XL) 300 MG 24 hr tablet Take 300 mg by mouth  daily.  . cetirizine (ZYRTEC) 10 MG tablet Take 10 mg by mouth at bedtime.  . fluticasone (FLONASE) 50 MCG/ACT nasal spray Place 2 sprays into both nostrils daily as needed for rhinitis.  Marland Kitchen lamoTRIgine (LAMICTAL) 200 MG tablet Take 300 mg by mouth at bedtime.   Marland Kitchen LORazepam (ATIVAN) 1 MG tablet Take 0.5-1 mg by mouth every 8 (eight) hours as needed for anxiety or sleep.   . metFORMIN (GLUCOPHAGE) 500 MG tablet take 1 tablet by mouth once daily with BREAKFAST  . montelukast (SINGULAIR) 10 MG tablet Take 1 tablet (10 mg total) by mouth at bedtime.  . pantoprazole (PROTONIX) 40 MG tablet Take 1 tablet (40 mg total) by mouth daily.  Marland Kitchen PARoxetine (PAXIL-CR) 25 MG 24 hr tablet Take 50 mg by mouth at bedtime.   Marland Kitchen spironolactone (ALDACTONE) 25 MG tablet Take 1 tablet (25 mg total) by mouth daily.   No current facility-administered medications on file prior to visit.    ROS  Constitutional: Denies any fever or chills Gastrointestinal: No reported hemesis, hematochezia, vomiting, or acute  GI distress Musculoskeletal: Denies any acute onset joint swelling, redness, loss of ROM, or weakness Neurological: No reported episodes of acute onset apraxia, aphasia, dysarthria, agnosia, amnesia, paralysis, loss of coordination, or loss of consciousness  Allergies  Debra Myers is allergic to oxycodone; penicillins; and sulfa antibiotics.  [place]  Drug: Debra Myers  reports that she does not use drugs. Alcohol:  reports that she does not drink alcohol. Tobacco:  reports that she has never smoked. She has never used smokeless tobacco. Medical:  has a past medical history of Anxiety; Asthma; Bulging lumbar disc (L3-4) (02/26/2015); Degenerative disc disease; Degenerative lumbar disc; Depression; Diabetes mellitus; Elective abortion; GERD (gastroesophageal reflux disease); Hypertension; Stroke [facility]) (06/11/2015); and Vaginal delivery. Family: family history includes Asthma in her maternal grandmother; Diabetes in her  father; Hypertension in her father and mother; Stroke in her maternal grandfather and maternal grandmother.  Past Surgical History:  Procedure Laterality Date  . CHOLECYSTECTOMY  2001  . DILATATION & CURETTAGE/HYSTEROSCOPY WITH MYOSURE N/A 09/10/2014   Procedure: DILATATION & CURETTAGE/HYSTEROSCOPY WITH MYOSURE/INSERTION OF IUD;  Surgeon: Terrance Mass, MD;  Location: [facility];  Service: Gynecology;  Laterality: N/A;  . Lapband  04/2012  . WISDOM TOOTH EXTRACTION     Constitutional Exam  General appearance: Well nourished, well developed, and well hydrated. In no apparent acute distress Vitals:   04/29/16 0830  BP: 132/75  Pulse: (!) 101  Resp: 16  Temp: 98.5 F (36.9 C)  TempSrc: Oral  SpO2: 99%  Weight: 255 lb (115.7 kg)  Height: '5\' 4"'  (1.626 m)   BMI Assessment: Estimated body mass index is 43.77 kg/m as calculated from the following:   Height as of this encounter: '5\' 4"'  (1.626 m).   Weight as of this encounter: 255 lb (115.7 kg).  BMI interpretation table: BMI level Category Range association with higher incidence of chronic pain  <18 kg/m2 Underweight   18.5-24.9 kg/m2 Ideal body weight   25-29.9 kg/m2 Overweight Increased incidence by 20%  30-34.9 kg/m2 Obese (Class I) Increased incidence by 68%  35-39.9 kg/m2 Severe obesity (Class II) Increased incidence by 136%  >40 kg/m2 Extreme obesity (Class III) Increased incidence by 254%   BMI Readings from Last 4 Encounters:  04/29/16 43.77 kg/m  04/04/16 44.63 kg/m  01/30/16 41.57 kg/m  11/21/15 43.08 kg/m   Wt Readings from Last 4 Encounters:  04/29/16 255 lb (115.7 kg)  04/04/16 260 lb (117.9 kg)  01/30/16 246 lb (111.6 kg)  11/21/15 251 lb (113.9 kg)  Psych/Mental status: Alert, oriented x 3 (person, place, & time)       Eyes: PERLA Respiratory: No evidence of acute respiratory distress  Cervical Spine Exam  Inspection: No masses, redness, or swelling Alignment: Symmetrical Functional ROM: Unrestricted  ROM Stability: No instability detected Muscle strength & Tone: Functionally intact Sensory: Unimpaired Palpation: Non-contributory  Upper Extremity (UE) Exam    Side: Right upper extremity  Side: Left upper extremity  Inspection: No masses, redness, swelling, or asymmetry. No contractures  Inspection: No masses, redness, swelling, or asymmetry. No contractures  Functional ROM: Unrestricted ROM          Functional ROM: Unrestricted ROM          Muscle strength & Tone: Functionally intact  Muscle strength & Tone: Functionally intact  Sensory: Unimpaired  Sensory: Unimpaired  Palpation: Euthermic  Palpation: Euthermic  Specialized Test(s): Deferred         Specialized Test(s): Deferred  Thoracic Spine Exam  Inspection: No masses, redness, or swelling Alignment: Symmetrical Functional ROM: Unrestricted ROM Stability: No instability detected Sensory: Unimpaired Muscle strength & Tone: Functionally intact Palpation: Non-contributory  Lumbar Spine Exam  Inspection: No masses, redness, or swelling Alignment: Symmetrical Functional ROM: Unrestricted ROM Stability: No instability detected Muscle strength & Tone: Functionally intact Sensory: Unimpaired Palpation: Non-contributory Provocative Tests: Lumbar Hyperextension and rotation test: evaluation deferred today       Patrick's Maneuver: evaluation deferred today              Gait & Posture Assessment  Ambulation: Unassisted Gait: Relatively normal for age and body habitus Posture: WNL   Lower Extremity Exam    Side: Right lower extremity  Side: Left lower extremity  Inspection: No masses, redness, swelling, or asymmetry. No contractures  Inspection: No masses, redness, swelling, or asymmetry. No contractures  Functional ROM: Unrestricted ROM          Functional ROM: Unrestricted ROM          Muscle strength & Tone: Functionally intact  Muscle strength & Tone: Functionally intact  Sensory: Unimpaired  Sensory: Unimpaired   Palpation: No palpable anomalies  Palpation: No palpable anomalies   Assessment  Primary Diagnosis & Pertinent Problem List: The primary encounter diagnosis was Chronic low back pain (Location of Primary Source of Pain) (Bilateral) (L>R). Diagnoses of Lumbar facet syndrome (Location of Primary Source of Pain) (Bilateral) (L>R), Chronic lower extremity pain (Location of Secondary source of pain) (Left), Cervical facet syndrome (Location of Tertiary source of pain) (Bilateral) (L>R), Chronic pain syndrome, Long term prescription opiate use, Opiate use (10 MME/Day), Morbid obesity with BMI of 40.0-44.9, adult ([facility]), Muscle spasm, nocturnal, and Musculoskeletal pain were also pertinent to this visit.  Status Diagnosis  Controlled Controlled Controlled 1. Chronic low back pain (Location of Primary Source of Pain) (Bilateral) (L>R)   2. Lumbar facet syndrome (Location of Primary Source of Pain) (Bilateral) (L>R)   3. Chronic lower extremity pain (Location of Secondary source of pain) (Left)   4. Cervical facet syndrome (Location of Tertiary source of pain) (Bilateral) (L>R)   5. Chronic pain syndrome   6. Long term prescription opiate use   7. Opiate use (10 MME/Day)   8. Morbid obesity with BMI of 40.0-44.9, adult ([place])   9. Muscle spasm, nocturnal   10. Musculoskeletal pain      Plan of Care  Pharmacotherapy (Medications Ordered): Meds ordered this encounter  Medications  . metaxalone (SKELAXIN) 800 MG tablet    Sig: Take 1 tablet (800 mg total) by mouth 3 (three) times daily as needed for muscle spasms.    Dispense:  90 tablet    Refill:  2    Do not add this medication to the electronic "Automatic Refill" notification system. Patient may have prescription filled one day early if pharmacy is closed on scheduled refill date.  Marland Kitchen HYDROcodone-acetaminophen (NORCO/VICODIN) 5-325 MG tablet    Sig: Take 0.5-1 tablets by mouth 2 (two) times daily as needed for severe pain.    Dispense:  60  tablet    Refill:  0    Do not add this medication to the electronic "Automatic Refill" notification system. Patient may have prescription filled one day early if pharmacy is closed on scheduled refill date. Do not fill until: 06/13/16 To last until: 07/13/16  . HYDROcodone-acetaminophen (NORCO/VICODIN) 5-325 MG tablet    Sig: Take 0.5-1 tablets by mouth 2 (two) times daily as needed for severe pain.  Dispense:  60 tablet    Refill:  0    Do not add this medication to the electronic "Automatic Refill" notification system. Patient may have prescription filled one day early if pharmacy is closed on scheduled refill date. Do not fill until: 05/14/16 To last until: 06/13/16  . HYDROcodone-acetaminophen (NORCO/VICODIN) 5-325 MG tablet    Sig: Take 0.5-1 tablets by mouth 2 (two) times daily as needed for severe pain.    Dispense:  60 tablet    Refill:  0    Do not add this medication to the electronic "Automatic Refill" notification system. Patient may have prescription filled one day early if pharmacy is closed on scheduled refill date. Do not fill until: 07/13/16 To last until: 08/12/16   New Prescriptions   No medications on file   Medications administered today: Debra Myers had no medications administered during this visit. Lab-work, procedure(s), and/or referral(s): No orders of the defined types were placed in this encounter.  Imaging and/or referral(s): None  Interventional therapies: Planned, scheduled, and/or pending:   None at this time secondary to the patient's body habitus    Considering:   Diagnostic left L3-4 lumbar epidural steroid injection  Diagnostic bilateral lumbar facet block  Possible bilateral lumbar facet radiofrequency ablation.  Diagnostic left-sided cervical epidural steroid injection  Diagnostic bilateral cervical facet block  Possible bilateral cervical facet radiofrequency ablation.    Palliative PRN treatment(s):   Diagnostic left L3-4 lumbar  epidural steroid injection  Diagnostic bilateral lumbar facet block  Diagnostic left-sided cervical epidural steroid injection  Diagnostic bilateral cervical facet block    Provider-requested follow-up: Return in about 3 months (around 07/30/2016) for (Nurse Practitioner) Med-Mgmt.  Future Appointments Date Time Provider [place]  05/24/2016 9:00 AM Pieter Partridge, DO [facility] None   Primary Care Physician: Lucille Passy, MD Location: [facility] Note by: Kathlen Brunswick. Dossie Arbour, M.D, DABA, DABAPM, DABPM, DABIPP, FIPP Date: 04/29/2016; Time: 8:05 AM  Pain Score Disclaimer: We use the NRS-11 scale. This is a self-reported, subjective measurement of pain severity with only modest accuracy. It is used primarily to identify changes within a particular patient. It must be understood that outpatient pain scales are significantly less accurate that those used for research, where they can be applied under ideal controlled circumstances with minimal exposure to variables. In reality, the score is likely to be a combination of pain intensity and pain affect, where pain affect describes the degree of emotional arousal or changes in action readiness caused by the sensory experience of pain. Factors such as social and work situation, setting, emotional state, anxiety levels, expectation, and prior pain experience may influence pain perception and show large inter-individual differences that may also be affected by time variables.  Patient instructions provided during this appointment: Patient Instructions  Today you were given 3 prescriptions for Hydrocodone x 3 and metaxalone.

## 2016-04-29 NOTE — Progress Notes (Signed)
Nursing Pain Medication Assessment:  Safety precautions to be maintained throughout the outpatient stay will include: orient to surroundings, keep bed in low position, maintain call bell within reach at all times, provide assistance with transfer out of bed and ambulation.  Medication Inspection Compliance: Pill count conducted under aseptic conditions, in front of the patient. Neither the pills nor the bottle was removed from the patient's sight at any time. Once count was completed pills were immediately returned to the patient in their original bottle.  Medication: See above Pill/Patch Count: 5 of 60 pills remain Bottle Appearance: Standard pharmacy container. Clearly labeled. Filled Date: 02 / 02 / 2018 Last Medication intake:  Today

## 2016-04-29 NOTE — Patient Instructions (Signed)
Today you were given 3 prescriptions for Hydrocodone x 3 and metaxalone.

## 2016-04-29 NOTE — Progress Notes (Signed)
A Lavender aromatherapy sample has been provided to the patient.   Quantity 1 gtt of 50% diluted lavender. Instructed regarding safe and effective use of essential oil.  Teach back 3 done, consent signed.

## 2016-05-14 ENCOUNTER — Other Ambulatory Visit: Payer: Self-pay | Admitting: Physician Assistant

## 2016-05-14 ENCOUNTER — Encounter: Payer: Self-pay | Admitting: Gynecology

## 2016-05-17 ENCOUNTER — Telehealth: Payer: Self-pay | Admitting: *Deleted

## 2016-05-17 NOTE — Telephone Encounter (Signed)
Med refill for protonix approved.  Increased to 90 day supply with 3 refills

## 2016-05-21 ENCOUNTER — Ambulatory Visit: Payer: Self-pay | Admitting: Neurology

## 2016-05-24 ENCOUNTER — Ambulatory Visit: Payer: Self-pay | Admitting: Neurology

## 2016-05-24 DIAGNOSIS — Z029 Encounter for administrative examinations, unspecified: Secondary | ICD-10-CM

## 2016-05-25 ENCOUNTER — Encounter: Payer: Self-pay | Admitting: Neurology

## 2016-06-08 ENCOUNTER — Ambulatory Visit: Payer: Self-pay | Admitting: Physician Assistant

## 2016-06-12 ENCOUNTER — Other Ambulatory Visit: Payer: Self-pay | Admitting: Family Medicine

## 2016-07-02 ENCOUNTER — Encounter: Payer: Self-pay | Admitting: Physician Assistant

## 2016-07-02 ENCOUNTER — Ambulatory Visit: Payer: Self-pay | Admitting: Physician Assistant

## 2016-07-02 VITALS — BP 140/70 | HR 113 | Temp 98.5°F | Resp 16 | Ht 64.0 in | Wt 260.0 lb

## 2016-07-02 DIAGNOSIS — Z0000 Encounter for general adult medical examination without abnormal findings: Secondary | ICD-10-CM

## 2016-07-02 NOTE — Progress Notes (Signed)
   Subjective:    Patient ID: Debra Myers, female    DOB: 01/27/1966, 51 y.o.   MRN: [id number]  HPI Patient present for [employer] physical exam.   Review of Systems Hyperlipidemia, Seizure, and Depression.    Objective:   Physical Exam Overweight. HEENT unremarkable. Neck supple w/o adenopathy. Lungs CTA and Heart RRR. Abdominal distension 2nd to body habitus. Normoactive BS, no guarding with palpation. No upper/lower extremity deformities. F/E ROM. No spinal deformity, decrease ROM with flexion. Negative straight leg tes. CNII-XII grossly intact.       Assessment & Plan:Well exam  Follow up for lab results.

## 2016-07-07 ENCOUNTER — Encounter: Payer: Self-pay | Admitting: Gynecology

## 2016-07-13 ENCOUNTER — Ambulatory Visit (INDEPENDENT_AMBULATORY_CARE_PROVIDER_SITE_OTHER): Payer: Managed Care, Other (non HMO) | Admitting: Neurology

## 2016-07-13 ENCOUNTER — Encounter: Payer: Self-pay | Admitting: Neurology

## 2016-07-13 VITALS — BP 140/90 | HR 104 | Ht 64.0 in | Wt 259.6 lb

## 2016-07-13 DIAGNOSIS — G459 Transient cerebral ischemic attack, unspecified: Secondary | ICD-10-CM | POA: Diagnosis not present

## 2016-07-13 DIAGNOSIS — E119 Type 2 diabetes mellitus without complications: Secondary | ICD-10-CM | POA: Diagnosis not present

## 2016-07-13 DIAGNOSIS — Z794 Long term (current) use of insulin: Secondary | ICD-10-CM | POA: Diagnosis not present

## 2016-07-13 DIAGNOSIS — I10 Essential (primary) hypertension: Secondary | ICD-10-CM

## 2016-07-13 MED ORDER — ATORVASTATIN CALCIUM 10 MG PO TABS: 10.0000 mg | ORAL_TABLET | Freq: Every day | ORAL | 5 refills | Status: DC

## 2016-07-13 NOTE — Progress Notes (Signed)
NEUROLOGY FOLLOW UP OFFICE NOTE  Debra Myers [id number]  HISTORY OF PRESENT ILLNESS: Debra Myers is a 51 year old right-handed woman with hypertension, type 2 diabetes, degenerative disc disease, anxiety and depression and status post lap-band surgery who follows up for TIA.  UPDATE: She is taking ASA 81mg  daily.  She was started on Lipitor 10mg  daily.  She has no recurrent events.  She reports some weight gain.   HISTORY: She was admitted to [facility] from 06/11/15 to 06/12/15 for acute onset of numbness that started at the end of her tongue and then spread to the lower lip and left upper and lower weakness.  There was also associated word-finding difficulties.  Symptoms didn't last long but she still had numbness on the tongue and side of mouth in the hospital.  Due to NIHSS score of 0 at time of evaluation, she did not receive tPA.  CT of head on 06/11/15 was negative.  Repeat CT of head the following day was unchanged.  MRI and MRA of head were unremarkable.  2D echo showed EF 60-65% with moderate LVH but no cardiac source of emboli.  LDL was 87.  Hgb A1c was 5.9.  She was discharged on ASA 81mg  daily with a diagnosis of TIA.  Lipid panel from 10/31/15 showed cholesterol 130, TG 89, HDL 53 and LDL 59.  Carotid doppler from 07/18/15 revealed no hemodynamically significant ICA stenosis.  PAST MEDICAL HISTORY: Past Medical History:  Diagnosis Date  . Anxiety   . Asthma    ALLERGY INDUCED  . Bulging lumbar disc (L3-4) 02/26/2015  . Degenerative disc disease   . Degenerative lumbar disc   . Depression   . Diabetes mellitus    TYPE 2  . Elective abortion    ONE  . GERD (gastroesophageal reflux disease)    takes prilosec   . Hypertension   . Stroke (HCC) 06/11/2015   patient describes as mini stroke  . Vaginal delivery    ONE NSVD    MEDICATIONS: Current Outpatient Prescriptions on File Prior to Visit  Medication Sig Dispense Refill  . buPROPion (WELLBUTRIN XL) 300 MG 24 hr tablet Take  300 mg by mouth daily.    . cetirizine (ZYRTEC) 10 MG tablet Take 10 mg by mouth at bedtime.    . fluticasone (FLONASE) 50 MCG/ACT nasal spray Place 2 sprays into both nostrils daily as needed for rhinitis. 16 g 1  . HYDROcodone-acetaminophen (NORCO/VICODIN) 5-325 MG tablet Take 0.5-1 tablets by mouth 2 (two) times daily as needed for severe pain. 60 tablet 0  . lamoTRIgine (LAMICTAL) 200 MG tablet Take 300 mg by mouth at bedtime.     Marland Kitchen lisdexamfetamine (VYVANSE) 20 MG capsule Take 20 mg by mouth daily.    Marland Kitchen LORazepam (ATIVAN) 1 MG tablet Take 0.5-1 mg by mouth every 8 (eight) hours as needed for anxiety or sleep.     . metaxalone (SKELAXIN) 800 MG tablet Take 1 tablet (800 mg total) by mouth 3 (three) times daily as needed for muscle spasms. 90 tablet 2  . metFORMIN (GLUCOPHAGE) 500 MG tablet take 1 tablet by mouth once daily with BREAKFAST 90 tablet 2  . montelukast (SINGULAIR) 10 MG tablet Take 1 tablet (10 mg total) by mouth at bedtime. 90 tablet 3  . pantoprazole (PROTONIX) 40 MG tablet take 1 tablet by mouth once daily 90 tablet 3  . PARoxetine (PAXIL-CR) 25 MG 24 hr tablet Take 50 mg by mouth at bedtime.     Marland Kitchen  RA ASPIRIN EC ADULT LOW ST 81 MG EC tablet take 1 tablet by mouth once daily 30 tablet 0  . spironolactone (ALDACTONE) 25 MG tablet Take 1 tablet (25 mg total) by mouth daily. 90 tablet 3   No current facility-administered medications on file prior to visit.     ALLERGIES: Allergies  Allergen Reactions  . Oxycodone Itching  . Penicillins Other (See Comments)    Reaction:  GI upset  Has patient had a PCN reaction causing immediate rash, facial/tongue/throat swelling, SOB or lightheadedness with hypotension: No Has patient had a PCN reaction causing severe rash involving mucus membranes or skin necrosis: No Has patient had a PCN reaction that required hospitalization No Has patient had a PCN reaction occurring within the last 10 years: No If all of the above answers are "NO",  then may proceed with Cephalosporin use.  . Sulfa Antibiotics Itching and Rash    FAMILY HISTORY: Family History  Problem Relation Age of Onset  . Hypertension Father   . Diabetes Father   . Hypertension Mother   . Asthma Maternal Grandmother   . Stroke Maternal Grandmother   . Stroke Maternal Grandfather     SOCIAL HISTORY: Social History   Social History  . Marital status: Married    Spouse name: N/A  . Number of children: N/A  . Years of education: N/A   Occupational History  . Not on file.   Social History Main Topics  . Smoking status: Never Smoker  . Smokeless tobacco: Never Used  . Alcohol use No     Comment: rarely  . Drug use: No  . Sexual activity: Not Currently    Birth control/ protection: IUD   Other Topics Concern  . Not on file   Social History Narrative  . No narrative on file    REVIEW OF SYSTEMS: Constitutional: No fevers, chills, or sweats, no generalized fatigue, change in appetite Eyes: No visual changes, double vision, eye pain Ear, nose and throat: No hearing loss, ear pain, nasal congestion, sore throat Cardiovascular: No chest pain, palpitations Respiratory:  No shortness of breath at rest or with exertion, wheezes GastrointestinaI: No nausea, vomiting, diarrhea, abdominal pain, fecal incontinence Genitourinary:  No dysuria, urinary retention or frequency Musculoskeletal:  No neck pain, back pain Integumentary: No rash, pruritus, skin lesions Neurological: as above Psychiatric: No depression, insomnia, anxiety Endocrine: No palpitations, fatigue, diaphoresis, mood swings, change in appetite, change in weight, increased thirst Hematologic/Lymphatic:  No purpura, petechiae. Allergic/Immunologic: no itchy/runny eyes, nasal congestion, recent allergic reactions, rashes  PHYSICAL EXAM: Vitals:   07/13/16 1121  BP: 140/90  Pulse: (!) 104   General: No acute distress.  Patient appears well-groomed.  Morbidly obese body habitus. Head:   Normocephalic/atraumatic Eyes:  Fundi examined but not visualized Neck: supple, no paraspinal tenderness, full range of motion Heart:  Regular rate and rhythm Lungs:  Clear to auscultation bilaterally Back: No paraspinal tenderness Neurological Exam: alert and oriented to person, place, and time. Attention span and concentration intact, recent and remote memory intact, fund of knowledge intact.  Speech fluent and not dysarthric, language intact.  CN II-XII intact. Bulk and tone normal, muscle strength 5/5 throughout.  Sensation to light touch, temperature and vibration intact.  Deep tendon reflexes 2+ throughout, toes downgoing.  Finger to nose and heel to shin testing intact.  Gait normal, Romberg negative.  IMPRESSION: Episode of transient unilateral weakness and perioral numbness.  My suspicion is that it was related to anxiety.  However,  TIA cannot be ruled out, as she does have stroke risk factors.  I do believe the perioral numbness from last weekend was anxiety. HTN Type 2 diabetes Morbid obesity  PLAN: 1.  Continue ASA 81mg  daily for secondary stroke prevention 2.  Continue Lipitor as managed by PCP (LDL at goal less than 70) 3.  Blood pressure and glycemic control as managed by PCP.  Blood pressure is borderline elevated.  Recheck at home and follow up with PCP if remains elevated. 4.  Weight loss 5.  Follow up as needed.  25 minutes spent face to face with patient, over 50% spent discussing management.  Shon MilletAdam Jaffe, DO  CC:  Ruthe Mannanalia Aron, MD

## 2016-07-13 NOTE — Patient Instructions (Signed)
1.  Continue aspirin 81mg  daily 2.  Continue blood pressure medication 3.  Continue Lipitor 4.  Follow up as needed.

## 2016-07-13 NOTE — Progress Notes (Deleted)
Patient's Name: Debra Myers  MRN: [id number]  Referring Provider: Lucille Passy, MD  DOB: [birth year]  PCP: Lucille Passy, MD  DOS: 07/27/2016  Note by: Vevelyn Francois NP  Service setting: Ambulatory outpatient  Specialty: Interventional Pain Management  Location: [facility] (AMB) Pain Management Facility    Patient type: Established    Primary Reason(s) for Visit: Encounter for prescription drug management (Level of risk: moderate) CC: No chief complaint on file.  HPI  Debra Myers is a 51 y.o. year old, female patient, who comes today for a medication management evaluation. She has GOITER, NONTOXIC MULTINODULAR; Diabetes ([place]); Polycystic ovaries; HYPERTRIGLYCERIDEMIA; Metabolic Syndrome X; OBESITY; Anxiety; Allergic rhinitis; Asthma; Hirsutism; Generalized anxiety disorder; Hypertension; Overweight(278.02); GERD (gastroesophageal reflux disease); IUD (intrauterine device) in place; Opiate use (10 MME/Day); Long term prescription opiate use; Long term current use of opiate analgesic; Encounter for therapeutic drug level monitoring; Encounter for chronic pain management; Numbness of upper extremity; Radiculitis involving upper extremity; Chronic low back pain (Location of Primary Source of Pain) (Bilateral) (L>R); Lumbar spondylosis (Bulging Disc & Severe Left Foraminal Stenosis at L3-4); Chronic lower extremity pain (Location of Secondary source of pain) (Left); Lumbar foraminal stenosis (Severe Left L3-4); Lumbar facet syndrome (Location of Primary Source of Pain) (Bilateral) (L>R); Cervical spondylosis (C5-6 & C6-7 DDD); Cervical (3 mm) Grade 1 Anterolisthesis of C4 over C5; Chronic neck pain (Location of Tertiary source of pain) (Bilateral) (L>R); Chronic cervical radicular pain (Bilateral) (L>R); Cervical foraminal stenosis (multilevel) (Bilateral); TIA (transient ischemic attack); Muscle spasm, nocturnal; Musculoskeletal pain; Carpal tunnel syndrome (Bilateral) (L>R); Cervical facet syndrome (Location of  Tertiary source of pain) (Bilateral) (L>R); Chronic pain syndrome; and Morbid obesity with BMI of 40.0-44.9, adult ([place]) on her problem list. Her primarily concern today is the No chief complaint on file.  Pain Assessment: Self-Reported Pain Score:  /10             Reported level is compatible with observation.          Debra Myers was last scheduled for an appointment on 04/29/16 for medication management. During today's appointment we reviewed Ms. Jarema's chronic pain status, as well as her outpatient medication regimen.  The patient  reports that she does not use drugs. Her body mass index is unknown because there is no height or weight on file.  Further details on both, my assessment(s), as well as the proposed treatment plan, please see below.  Controlled Substance Pharmacotherapy Assessment REMS (Risk Evaluation and Mitigation Strategy)  Analgesic:Hydrocodone/APAP 5/325 2 tablets per day (10 mg/day) MME/day:10 mg/day   No notes on file Pharmacokinetics: Liberation and absorption (onset of action): WNL Distribution (time to peak effect): WNL Metabolism and excretion (duration of action): WNL         Pharmacodynamics: Desired effects: Analgesia: Ms. Artley reports >50% benefit. Functional ability: Patient reports that medication allows her to accomplish basic ADLs Clinically meaningful improvement in function (CMIF): Sustained CMIF goals met Perceived effectiveness: Described as relatively effective, allowing for increase in activities of daily living (ADL) Undesirable effects: Side-effects or Adverse reactions: None reported Monitoring: [place] PMP: Online review of the past 22-monthperiod conducted. Compliant with practice rules and regulations List of all UDS test(s) done:  Lab Results  Component Value Date   TOXASSSELUR FINAL 08/05/2015   TOXASSSELUR FINAL 05/26/2015   [place] 02/26/2015   Last UDS on record: ToxAssure Select 13  Date Value Ref Range Status   08/05/2015 FINAL  Final    Comment:    ====================================================================  TOXASSURE SELECT 13 (MW) ==================================================================== Test                             Result       Flag       Units Drug Present and Declared for Prescription Verification   Lorazepam                      925          EXPECTED   ng/mg creat    Source of lorazepam is a scheduled prescription medication.   Hydrocodone                    433          EXPECTED   ng/mg creat   Norhydrocodone                 871          EXPECTED   ng/mg creat    Sources of hydrocodone include scheduled prescription    medications. Norhydrocodone is an expected metabolite of    hydrocodone. ==================================================================== Test                      Result    Flag   Units      Ref Range   Creatinine              52               mg/dL      >=20 ==================================================================== Declared Medications:  The flagging and interpretation on this report are based on the  following declared medications.  Unexpected results may arise from  inaccuracies in the declared medications.  **Note: The testing scope of this panel includes these medications:  Hydrocodone (Hydrocodone-Acetaminophen)  Lorazepam (Ativan)  **Note: The testing scope of this panel does not include following  reported medications:  Acetaminophen (Hydrocodone-Acetaminophen)  Albuterol  Aspirin  Atorvastatin  Bupropion (Wellbutrin)  Cetirizine  Fluticasone (Flonase)  Ibuprofen  Lamotrigine  Metaxalone (Skelaxin)  Metformin  Montelukast (Singulair)  Omeprazole  Paroxetine  Spironolactone (Aldactone) ==================================================================== For clinical consultation, please call [phone]. ====================================================================    UDS interpretation: Compliant           Medication Assessment Form: Reviewed. Patient indicates being compliant with therapy Treatment compliance: Compliant Risk Assessment Profile: Aberrant behavior: See prior evaluations. None observed or detected today Comorbid factors increasing risk of overdose: See prior notes. No additional risks detected today Risk of substance use disorder (SUD): Low Opioid Risk Tool (ORT) Total Score:    Interpretation Table:  Score <3 = Low Risk for SUD  Score between 4-7 = Moderate Risk for SUD  Score >8 = High Risk for Opioid Abuse   Risk Mitigation Strategies:  Patient Counseling: Covered Patient-Prescriber Agreement (PPA): Present and active  Notification to other healthcare providers: Done  Pharmacologic Plan: No change in therapy, at this time  Laboratory Chemistry  Inflammation Markers No results found for: CRP, ESRSEDRATE (CRP: Acute Phase) (ESR: Chronic Phase) Renal Function Markers Lab Results  Component Value Date   BUN 12 04/04/2016   CREATININE 0.88 04/04/2016   GFRAA >60 04/04/2016   GFRNONAA >60 04/04/2016   Hepatic Function Markers Lab Results  Component Value Date   AST 16 10/31/2015   ALT 16 10/31/2015   ALBUMIN 4.4 10/31/2015   ALKPHOS 125 (H) 10/31/2015   HCVAB NEGATIVE 05/18/2013  Electrolytes Lab Results  Component Value Date   NA 137 04/04/2016   K 4.3 04/04/2016   CL 104 04/04/2016   CALCIUM 9.2 04/04/2016   Neuropathy Markers No results found for: [id number] Bone Pathology Markers Lab Results  Component Value Date   ALKPHOS 125 (H) 10/31/2015   VD25OH 44 05/18/2013   CALCIUM 9.2 04/04/2016   Coagulation Parameters Lab Results  Component Value Date   INR 1.10 06/11/2015   LABPROT 14.4 06/11/2015   APTT 33 06/11/2015   PLT 281 04/04/2016   Cardiovascular Markers Lab Results  Component Value Date   HGB 11.8 (L) 04/04/2016   HCT 36.8 04/04/2016   Note: Lab results reviewed.  Recent Diagnostic Imaging Review  No results  found. Note: Imaging results reviewed.          Meds  The patient has a current medication list which includes the following prescription(s): atorvastatin, bupropion, cetirizine, fluticasone, hydrocodone-acetaminophen, hydrocodone-acetaminophen, hydrocodone-acetaminophen, lamotrigine, lisdexamfetamine, lorazepam, metaxalone, metformin, montelukast, pantoprazole, paroxetine, ra aspirin ec adult low st, and spironolactone.  Current Outpatient Prescriptions on File Prior to Visit  Medication Sig  . atorvastatin (LIPITOR) 10 MG tablet Take 1 tablet (10 mg total) by mouth daily.  Marland Kitchen buPROPion (WELLBUTRIN XL) 300 MG 24 hr tablet Take 300 mg by mouth daily.  . cetirizine (ZYRTEC) 10 MG tablet Take 10 mg by mouth at bedtime.  . fluticasone (FLONASE) 50 MCG/ACT nasal spray Place 2 sprays into both nostrils daily as needed for rhinitis.  Marland Kitchen HYDROcodone-acetaminophen (NORCO/VICODIN) 5-325 MG tablet Take 0.5-1 tablets by mouth 2 (two) times daily as needed for severe pain.  Marland Kitchen HYDROcodone-acetaminophen (NORCO/VICODIN) 5-325 MG tablet Take 0.5-1 tablets by mouth 2 (two) times daily as needed for severe pain.  Marland Kitchen HYDROcodone-acetaminophen (NORCO/VICODIN) 5-325 MG tablet Take 0.5-1 tablets by mouth 2 (two) times daily as needed for severe pain.  Marland Kitchen lamoTRIgine (LAMICTAL) 200 MG tablet Take 300 mg by mouth at bedtime.   Marland Kitchen lisdexamfetamine (VYVANSE) 20 MG capsule Take 20 mg by mouth daily.  Marland Kitchen LORazepam (ATIVAN) 1 MG tablet Take 0.5-1 mg by mouth every 8 (eight) hours as needed for anxiety or sleep.   . metaxalone (SKELAXIN) 800 MG tablet Take 1 tablet (800 mg total) by mouth 3 (three) times daily as needed for muscle spasms.  . metFORMIN (GLUCOPHAGE) 500 MG tablet take 1 tablet by mouth once daily with BREAKFAST  . montelukast (SINGULAIR) 10 MG tablet Take 1 tablet (10 mg total) by mouth at bedtime.  . pantoprazole (PROTONIX) 40 MG tablet take 1 tablet by mouth once daily  . PARoxetine (PAXIL-CR) 25 MG 24 hr tablet  Take 50 mg by mouth at bedtime.   Marland Kitchen RA ASPIRIN EC ADULT LOW ST 81 MG EC tablet take 1 tablet by mouth once daily  . spironolactone (ALDACTONE) 25 MG tablet Take 1 tablet (25 mg total) by mouth daily.   No current facility-administered medications on file prior to visit.    ROS  Constitutional: Denies any fever or chills Gastrointestinal: No reported hemesis, hematochezia, vomiting, or acute GI distress Musculoskeletal: Denies any acute onset joint swelling, redness, loss of ROM, or weakness Neurological: No reported episodes of acute onset apraxia, aphasia, dysarthria, agnosia, amnesia, paralysis, loss of coordination, or loss of consciousness  Allergies  Ms. Costanza is allergic to oxycodone; penicillins; and sulfa antibiotics.  [place]  Drug: Ms. Austill  reports that she does not use drugs. Alcohol:  reports that she does not drink alcohol. Tobacco:  reports that she has  never smoked. She has never used smokeless tobacco. Medical:  has a past medical history of Anxiety; Asthma; Bulging lumbar disc (L3-4) (02/26/2015); Degenerative disc disease; Degenerative lumbar disc; Depression; Diabetes mellitus; Elective abortion; GERD (gastroesophageal reflux disease); Hypertension; Stroke [facility]) (06/11/2015); and Vaginal delivery. Family: family history includes Asthma in her maternal grandmother; Diabetes in her father; Hypertension in her father and mother; Stroke in her maternal grandfather and maternal grandmother.  Past Surgical History:  Procedure Laterality Date  . CHOLECYSTECTOMY  2001  . DILATATION & CURETTAGE/HYSTEROSCOPY WITH MYOSURE N/A 09/10/2014   Procedure: DILATATION & CURETTAGE/HYSTEROSCOPY WITH MYOSURE/INSERTION OF IUD;  Surgeon: Terrance Mass, MD;  Location: [facility];  Service: Gynecology;  Laterality: N/A;  . Lapband  04/2012  . WISDOM TOOTH EXTRACTION     Constitutional Exam  General appearance: Well nourished, well developed, and well hydrated. In no apparent acute distress There  were no vitals filed for this visit. BMI Assessment: Estimated body mass index is 44.63 kg/m as calculated from the following:   Height as of 07/02/16: _0  (1.626 m).   Weight as of 07/02/16: 260 lb (117.9 kg).  BMI interpretation table: BMI level Category Range association with higher incidence of chronic pain  <18 kg/m2 Underweight   18.5-24.9 kg/m2 Ideal body weight   25-29.9 kg/m2 Overweight Increased incidence by 20%  30-34.9 kg/m2 Obese (Class I) Increased incidence by 68%  35-39.9 kg/m2 Severe obesity (Class II) Increased incidence by 136%  >40 kg/m2 Extreme obesity (Class III) Increased incidence by 254%   BMI Readings from Last 4 Encounters:  07/02/16 44.63 kg/m  04/29/16 43.77 kg/m  04/04/16 44.63 kg/m  01/30/16 41.57 kg/m   Wt Readings from Last 4 Encounters:  07/02/16 260 lb (117.9 kg)  04/29/16 255 lb (115.7 kg)  04/04/16 260 lb (117.9 kg)  01/30/16 246 lb (111.6 kg)  Psych/Mental status: Alert, oriented x 3 (person, place, & time)       Eyes: PERLA Respiratory: No evidence of acute respiratory distress  Cervical Spine Exam  Inspection: No masses, redness, or swelling Alignment: Symmetrical Functional ROM: Unrestricted ROM      Stability: No instability detected Muscle strength & Tone: Functionally intact Sensory: Unimpaired Palpation: No palpable anomalies              Upper Extremity (UE) Exam    Side: Right upper extremity  Side: Left upper extremity  Inspection: No masses, redness, swelling, or asymmetry. No contractures  Inspection: No masses, redness, swelling, or asymmetry. No contractures  Functional ROM: Unrestricted ROM          Functional ROM: Unrestricted ROM          Muscle strength & Tone: Functionally intact  Muscle strength & Tone: Functionally intact  Sensory: Unimpaired  Sensory: Unimpaired  Palpation: No palpable anomalies              Palpation: No palpable anomalies              Specialized Test(s): Deferred         Specialized  Test(s): Deferred          Thoracic Spine Exam  Inspection: No masses, redness, or swelling Alignment: Symmetrical Functional ROM: Unrestricted ROM Stability: No instability detected Sensory: Unimpaired Muscle strength & Tone: No palpable anomalies  Lumbar Spine Exam  Inspection: No masses, redness, or swelling Alignment: Symmetrical Functional ROM: Unrestricted ROM      Stability: No instability detected Muscle strength & Tone: Functionally intact Sensory: Unimpaired Palpation: No  palpable anomalies       Provocative Tests: Lumbar Hyperextension and rotation test: evaluation deferred today       Patrick's Maneuver: evaluation deferred today                    Gait & Posture Assessment  Ambulation: Unassisted Gait: Relatively normal for age and body habitus Posture: WNL   Lower Extremity Exam    Side: Right lower extremity  Side: Left lower extremity  Inspection: No masses, redness, swelling, or asymmetry. No contractures  Inspection: No masses, redness, swelling, or asymmetry. No contractures  Functional ROM: Unrestricted ROM          Functional ROM: Unrestricted ROM          Muscle strength & Tone: Functionally intact  Muscle strength & Tone: Functionally intact  Sensory: Unimpaired  Sensory: Unimpaired  Palpation: No palpable anomalies  Palpation: No palpable anomalies   Assessment  Primary Diagnosis & Pertinent Problem List: Diagnoses of Chronic pain syndrome, Muscle spasm, nocturnal, and Musculoskeletal pain were pertinent to this visit.  Status Diagnosis  Controlled Controlled Controlled 1. Chronic pain syndrome   2. Muscle spasm, nocturnal   3. Musculoskeletal pain     Problems updated and reviewed during this visit: Problem  Chronic Pain Syndrome  Muscle Spasm, Nocturnal  Musculoskeletal Pain  Carpal tunnel syndrome (Bilateral) (L>R)   Positive Phalen's test bilaterally and positive Tinel's test on the left.   Cervical facet syndrome (Location of Tertiary  source of pain) (Bilateral) (L>R)  Cervical spondylosis (C5-6 & C6-7 DDD)  Cervical (3 mm) Grade 1 Anterolisthesis of C4 over C5  Chronic neck pain (Location of Tertiary source of pain) (Bilateral) (L>R)  Chronic cervical radicular pain (Bilateral) (L>R)  Numbness of Upper Extremity  Radiculitis Involving Upper Extremity  Chronic low back pain (Location of Primary Source of Pain) (Bilateral) (L>R)  Lumbar spondylosis (Bulging Disc & Severe Left Foraminal Stenosis at L3-4)  Chronic lower extremity pain (Location of Secondary source of pain) (Left)   Pain goes down to the level of the knee through the lateral portion of the leg.   Lumbar foraminal stenosis (Severe Left L3-4)  Lumbar facet syndrome (Location of Primary Source of Pain) (Bilateral) (L>R)  Morbid Obesity With Bmi of 40.0-44.9, Adult ([facility])  Cervical foraminal stenosis (multilevel) (Bilateral)  Long Term Current Use of Opiate Analgesic  Encounter for Therapeutic Drug Level Monitoring  Encounter for Chronic Pain Management  Opiate use (10 MME/Day)  Long Term Prescription Opiate Use  IUD (Intrauterine Device) in Place    Mirena IUD placed  July 2016 good for 5 years   Overweight(278.02)  Allergic Rhinitis   Qualifier: Diagnosis of  By: Maxie Better FNP, Rosalita Levan    Tia (Transient Ischemic Attack)  Gerd (Gastroesophageal Reflux Disease)  Hypertension  HYPERTRIGLYCERIDEMIA   Qualifier: Diagnosis of  By: Lia Hopping LPN, Rena     GOITER, NONTOXIC MULTINODULAR   Qualifier: Diagnosis of  By: Maxie Better FNP, Rosalita Levan    Diabetes ([facility])   Qualifier: Diagnosis of  By: Maxie Better FNP, Rosalita Levan    Polycystic Ovaries   Qualifier: Diagnosis of  By: Maxie Better FNP, Rosalita Levan    Metabolic Syndrome X   Qualifier: Diagnosis of  By: Maxie Better FNP, Rosalita Levan    OBESITY   Qualifier: Diagnosis of  By: Maxie Better FNP, Rosalita Levan    Anxiety   Qualifier: Diagnosis of  By: Maxie Better FNP, Rosalita Levan    Asthma   Qualifier: Diagnosis of  By: Maxie Better FNP, Rosalita Levan    Hirsutism   Qualifier: Diagnosis of  By: Maxie Better FNP, Rosalita Levan    Generalized Anxiety Disorder   Qualifier: Diagnosis of  By: Maxie Better FNP, [place] of Care  Pharmacotherapy (Medications Ordered): No orders of the defined types were placed in this encounter.  New Prescriptions   No medications on file   Medications administered today: Ms. Strom had no medications administered during this visit. Lab-work, procedure(s), and/or referral(s): No orders of the defined types were placed in this encounter.  Imaging and/or referral(s): None  Interventional therapies: Planned, scheduled, and/or pending:   Not at this time.   Considering:   Diagnostic left L3-4 lumbar epidural steroid injection  Diagnostic bilateral lumbar facet block  Possible bilateral lumbar facet radiofrequency ablation.  Diagnostic left-sided cervical epidural steroid injection  Diagnostic bilateral cervical facet block  Possible bilateral cervical facet radiofrequency ablation.    Palliative PRN treatment(s):   Palliative left L3-4 lumbar epidural steroid injection  Palliative bilateral lumbar facet block  Palliative left-sided cervical epidural steroid injection  Palliative bilateral cervical facet block    Provider-requested follow-up: No Follow-up on file.  Future Appointments Date Time Provider [place]  07/27/2016 8:00 AM Vevelyn Francois, NP [facility] None   Primary Care Physician: Lucille Passy, MD Location: [facility] Note by: Vevelyn Francois NP Date: 07/27/2016; Time: 11:14 AM  Pain Score Disclaimer: We use the NRS-11 scale. This is a self-reported, subjective measurement of pain severity with only modest accuracy. It is used primarily to identify changes within a particular patient. It must be understood that outpatient pain scales are  significantly less accurate that those used for research, where they can be applied under ideal controlled circumstances with minimal exposure to variables. In reality, the score is likely to be a combination of pain intensity and pain affect, where pain affect describes the degree of emotional arousal or changes in action readiness caused by the sensory experience of pain. Factors such as social and work situation, setting, emotional state, anxiety levels, expectation, and prior pain experience may influence pain perception and show large inter-individual differences that may also be affected by time variables.  Patient instructions provided during this appointment: There are no Patient Instructions on file for this visit.

## 2016-07-27 ENCOUNTER — Ambulatory Visit: Payer: Managed Care, Other (non HMO) | Admitting: Nurse Practitioner

## 2016-08-19 ENCOUNTER — Encounter: Payer: Self-pay | Admitting: Nurse Practitioner

## 2016-08-19 ENCOUNTER — Ambulatory Visit: Payer: Managed Care, Other (non HMO) | Attending: Nurse Practitioner | Admitting: Nurse Practitioner

## 2016-08-19 VITALS — BP 154/74 | HR 106 | Temp 98.2°F | Resp 16 | Ht 64.0 in | Wt 262.0 lb

## 2016-08-19 DIAGNOSIS — E119 Type 2 diabetes mellitus without complications: Secondary | ICD-10-CM | POA: Insufficient documentation

## 2016-08-19 DIAGNOSIS — K219 Gastro-esophageal reflux disease without esophagitis: Secondary | ICD-10-CM | POA: Insufficient documentation

## 2016-08-19 DIAGNOSIS — M47816 Spondylosis without myelopathy or radiculopathy, lumbar region: Secondary | ICD-10-CM | POA: Insufficient documentation

## 2016-08-19 DIAGNOSIS — Z833 Family history of diabetes mellitus: Secondary | ICD-10-CM | POA: Insufficient documentation

## 2016-08-19 DIAGNOSIS — Z9889 Other specified postprocedural states: Secondary | ICD-10-CM | POA: Insufficient documentation

## 2016-08-19 DIAGNOSIS — Z975 Presence of (intrauterine) contraceptive device: Secondary | ICD-10-CM | POA: Diagnosis not present

## 2016-08-19 DIAGNOSIS — Z6841 Body Mass Index (BMI) 40.0 and over, adult: Secondary | ICD-10-CM | POA: Insufficient documentation

## 2016-08-19 DIAGNOSIS — Z880 Allergy status to penicillin: Secondary | ICD-10-CM | POA: Diagnosis not present

## 2016-08-19 DIAGNOSIS — Z885 Allergy status to narcotic agent status: Secondary | ICD-10-CM | POA: Insufficient documentation

## 2016-08-19 DIAGNOSIS — Z7982 Long term (current) use of aspirin: Secondary | ICD-10-CM | POA: Insufficient documentation

## 2016-08-19 DIAGNOSIS — G894 Chronic pain syndrome: Secondary | ICD-10-CM | POA: Insufficient documentation

## 2016-08-19 DIAGNOSIS — Z9049 Acquired absence of other specified parts of digestive tract: Secondary | ICD-10-CM | POA: Insufficient documentation

## 2016-08-19 DIAGNOSIS — Z8673 Personal history of transient ischemic attack (TIA), and cerebral infarction without residual deficits: Secondary | ICD-10-CM | POA: Insufficient documentation

## 2016-08-19 DIAGNOSIS — I10 Essential (primary) hypertension: Secondary | ICD-10-CM | POA: Diagnosis not present

## 2016-08-19 DIAGNOSIS — Z825 Family history of asthma and other chronic lower respiratory diseases: Secondary | ICD-10-CM | POA: Insufficient documentation

## 2016-08-19 DIAGNOSIS — Z9884 Bariatric surgery status: Secondary | ICD-10-CM | POA: Insufficient documentation

## 2016-08-19 DIAGNOSIS — M791 Myalgia: Secondary | ICD-10-CM | POA: Insufficient documentation

## 2016-08-19 DIAGNOSIS — Z823 Family history of stroke: Secondary | ICD-10-CM | POA: Insufficient documentation

## 2016-08-19 DIAGNOSIS — F329 Major depressive disorder, single episode, unspecified: Secondary | ICD-10-CM | POA: Diagnosis not present

## 2016-08-19 DIAGNOSIS — Z882 Allergy status to sulfonamides status: Secondary | ICD-10-CM | POA: Insufficient documentation

## 2016-08-19 DIAGNOSIS — Z7984 Long term (current) use of oral hypoglycemic drugs: Secondary | ICD-10-CM | POA: Diagnosis not present

## 2016-08-19 DIAGNOSIS — M62838 Other muscle spasm: Secondary | ICD-10-CM

## 2016-08-19 DIAGNOSIS — M4696 Unspecified inflammatory spondylopathy, lumbar region: Secondary | ICD-10-CM

## 2016-08-19 DIAGNOSIS — M7918 Myalgia, other site: Secondary | ICD-10-CM

## 2016-08-19 DIAGNOSIS — Z79891 Long term (current) use of opiate analgesic: Secondary | ICD-10-CM | POA: Insufficient documentation

## 2016-08-19 DIAGNOSIS — Z8249 Family history of ischemic heart disease and other diseases of the circulatory system: Secondary | ICD-10-CM | POA: Insufficient documentation

## 2016-08-19 DIAGNOSIS — E781 Pure hyperglyceridemia: Secondary | ICD-10-CM | POA: Insufficient documentation

## 2016-08-19 MED ORDER — HYDROCODONE-ACETAMINOPHEN 5-325 MG PO TABS: 0.5000 | ORAL_TABLET | Freq: Two times a day (BID) | ORAL | 0 refills | Status: DC | PRN

## 2016-08-19 MED ORDER — METAXALONE 800 MG PO TABS: 800.0000 mg | ORAL_TABLET | Freq: Three times a day (TID) | ORAL | 2 refills | Status: DC | PRN

## 2016-08-19 NOTE — Progress Notes (Signed)
Patient's Name: Debra Myers  MRN: [id number]  Referring Provider: Lucille Passy, MD  DOB: [birth year]  PCP: Lucille Passy, MD  DOS: 08/19/2016  Note by: Vevelyn Francois NP  Service setting: Ambulatory outpatient  Specialty: Interventional Pain Management  Location: [facility] (AMB) Pain Management Facility    Patient type: Established    Primary Reason(s) for Visit: Encounter for prescription drug management (Level of risk: moderate) CC: No chief complaint on file.  HPI  Debra Myers is a 51 y.o. year old, female patient, who comes today for a medication management evaluation. She has GOITER, NONTOXIC MULTINODULAR; Diabetes ([place]); Polycystic ovaries; HYPERTRIGLYCERIDEMIA; Metabolic Syndrome X; OBESITY; Anxiety; Allergic rhinitis; Asthma; Hirsutism; Generalized anxiety disorder; Hypertension; Overweight(278.02); GERD (gastroesophageal reflux disease); IUD (intrauterine device) in place; Opiate use (10 MME/Day); Long term prescription opiate use; Long term current use of opiate analgesic; Encounter for therapeutic drug level monitoring; Encounter for chronic pain management; Numbness of upper extremity; Radiculitis involving upper extremity; Chronic low back pain (Location of Primary Source of Pain) (Bilateral) (L>R); Lumbar spondylosis (Bulging Disc & Severe Left Foraminal Stenosis at L3-4); Chronic lower extremity pain (Location of Secondary source of pain) (Left); Lumbar foraminal stenosis (Severe Left L3-4); Lumbar facet syndrome (Location of Primary Source of Pain) (Bilateral) (L>R); Cervical spondylosis (C5-6 & C6-7 DDD); Cervical (3 mm) Grade 1 Anterolisthesis of C4 over C5; Chronic neck pain (Location of Tertiary source of pain) (Bilateral) (L>R); Chronic cervical radicular pain (Bilateral) (L>R); Cervical foraminal stenosis (multilevel) (Bilateral); TIA (transient ischemic attack); Muscle spasm, nocturnal; Musculoskeletal pain; Carpal tunnel syndrome (Bilateral) (L>R); Cervical facet syndrome (Location of  Tertiary source of pain) (Bilateral) (L>R); Chronic pain syndrome; and Morbid obesity with BMI of 40.0-44.9, adult ([place]) on her problem list. Her primarily concern today is the No chief complaint on file.  Pain Assessment: Location:     Radiating:   Onset:   Duration:   Quality:   Severity: 1 /10 (self-reported pain score)  Note: Reported level is compatible with observation.                   Effect on ADL:   Timing:   Modifying factors:    Debra Myers was last scheduled for an appointment on 04/29/16 for medication management. During today's appointment we reviewed Debra Myers's chronic pain status, as well as her outpatient medication regimen. She has chronic low back pain. She has radicular symptoms that do down into her left hip with occasional radiating to her knee. She has some numbness and tingling. She denies any weakness. She admits that she getting in the pool is effect. She is trying to walk more.   The patient  reports that she does not use drugs. Her body mass index is 44.97 kg/m.  Further details on both, my assessment(s), as well as the proposed treatment plan, please see below.  Controlled Substance Pharmacotherapy Assessment REMS (Risk Evaluation and Mitigation Strategy)  Analgesic:Hydrocodone/APAP 5/325 2 tablets per day (10 mg/day) MME/day:10 mg/day  Charna Busman, NT  08/19/2016  2:07 PM  Sign at close encounter Nursing Pain Medication Assessment:  Safety precautions to be maintained throughout the outpatient stay will include: orient to surroundings, keep bed in low position, maintain call bell within reach at all times, provide assistance with transfer out of bed and ambulation.  Medication Inspection Compliance: Pill count conducted under aseptic conditions, in front of the patient. Neither the pills nor the bottle was removed from the patient's sight at any time. Once count was  completed pills were immediately returned to the patient in their original  bottle.  Medication: Hydrocodone/APAP Pill/Patch Count: 7.5 of 60 pills remain Pill/Patch Appearance: Markings consistent with prescribed medication Bottle Appearance: Standard pharmacy container. Clearly labeled. Filled Date:05/31/ 2018 Last Medication intake:  Today   Pharmacokinetics: Liberation and absorption (onset of action): WNL Distribution (time to peak effect): WNL Metabolism and excretion (duration of action): WNL         Pharmacodynamics: Desired effects: Analgesia: Debra Myers reports >50% benefit. Functional ability: Patient reports that medication allows her to accomplish basic ADLs Clinically meaningful improvement in function (CMIF): Sustained CMIF goals met Perceived effectiveness: Described as relatively effective, allowing for increase in activities of daily living (ADL) Undesirable effects: Side-effects or Adverse reactions: None reported Monitoring: [place] PMP: Online review of the past 26-monthperiod conducted. Compliant with practice rules and regulations List of all UDS test(s) done:  Lab Results  Component Value Date   TOXASSSELUR FINAL 08/05/2015   TOXASSSELUR FINAL 05/26/2015   [place] 02/26/2015   Last UDS on record: ToxAssure Select 13  Date Value Ref Range Status  08/05/2015 FINAL  Final    Comment:    ==================================================================== TOXASSURE SELECT 13 (MW) ==================================================================== Test                             Result       Flag       Units Drug Present and Declared for Prescription Verification   Lorazepam                      925          EXPECTED   ng/mg creat    Source of lorazepam is a scheduled prescription medication.   Hydrocodone                    433          EXPECTED   ng/mg creat   Norhydrocodone                 871          EXPECTED   ng/mg creat    Sources of hydrocodone include scheduled prescription    medications. Norhydrocodone is an  expected metabolite of    hydrocodone. ==================================================================== Test                      Result    Flag   Units      Ref Range   Creatinine              52               mg/dL      >=20 ==================================================================== Declared Medications:  The flagging and interpretation on this report are based on the  following declared medications.  Unexpected results may arise from  inaccuracies in the declared medications.  **Note: The testing scope of this panel includes these medications:  Hydrocodone (Hydrocodone-Acetaminophen)  Lorazepam (Ativan)  **Note: The testing scope of this panel does not include following  reported medications:  Acetaminophen (Hydrocodone-Acetaminophen)  Albuterol  Aspirin  Atorvastatin  Bupropion (Wellbutrin)  Cetirizine  Fluticasone (Flonase)  Ibuprofen  Lamotrigine  Metaxalone (Skelaxin)  Metformin  Montelukast (Singulair)  Omeprazole  Paroxetine  Spironolactone (Aldactone) ==================================================================== For clinical consultation, please call [phone] ====================================================================    UDS interpretation: Compliant  Medication Assessment Form: Reviewed. Patient indicates being compliant with therapy Treatment compliance: Compliant Risk Assessment Profile: Aberrant behavior: See prior evaluations. None observed or detected today Comorbid factors increasing risk of overdose: See prior notes. No additional risks detected today Risk of substance use disorder (SUD): Low Opioid Risk Tool (ORT) Total Score: 3  Interpretation Table:  Score <3 = Low Risk for SUD  Score between 4-7 = Moderate Risk for SUD  Score >8 = High Risk for Opioid Abuse   Risk Mitigation Strategies:  Patient Counseling: Covered Patient-Prescriber Agreement (PPA): Present and active  Notification to other  healthcare providers: Done  Pharmacologic Plan: No change in therapy, at this time  Laboratory Chemistry  Inflammation Markers No results found for: CRP, ESRSEDRATE (CRP: Acute Phase) (ESR: Chronic Phase) Renal Function Markers Lab Results  Component Value Date   BUN 12 04/04/2016   CREATININE 0.88 04/04/2016   GFRAA >60 04/04/2016   GFRNONAA >60 04/04/2016   Hepatic Function Markers Lab Results  Component Value Date   AST 16 10/31/2015   ALT 16 10/31/2015   ALBUMIN 4.4 10/31/2015   ALKPHOS 125 (H) 10/31/2015   HCVAB NEGATIVE 05/18/2013   Electrolytes Lab Results  Component Value Date   NA 137 04/04/2016   K 4.3 04/04/2016   CL 104 04/04/2016   CALCIUM 9.2 04/04/2016   Neuropathy Markers No results found for: [id number] Bone Pathology Markers Lab Results  Component Value Date   ALKPHOS 125 (H) 10/31/2015   VD25OH 44 05/18/2013   CALCIUM 9.2 04/04/2016   Coagulation Parameters Lab Results  Component Value Date   INR 1.10 06/11/2015   LABPROT 14.4 06/11/2015   APTT 33 06/11/2015   PLT 281 04/04/2016   Cardiovascular Markers Lab Results  Component Value Date   HGB 11.8 (L) 04/04/2016   HCT 36.8 04/04/2016   Note: Lab results reviewed.  Recent Diagnostic Imaging Review  No results found. Note: Imaging results reviewed.          Meds   Current Outpatient Prescriptions (Endocrine & Metabolic):  .  metFORMIN (GLUCOPHAGE) 500 MG tablet, take 1 tablet by mouth once daily with BREAKFAST  Current Outpatient Prescriptions (Cardiovascular):  .  atorvastatin (LIPITOR) 10 MG tablet, Take 1 tablet (10 mg total) by mouth daily. Marland Kitchen  spironolactone (ALDACTONE) 25 MG tablet, Take 1 tablet (25 mg total) by mouth daily.  Current Outpatient Prescriptions (Respiratory):  .  cetirizine (ZYRTEC) 10 MG tablet, Take 10 mg by mouth at bedtime. .  fluticasone (FLONASE) 50 MCG/ACT nasal spray, Place 2 sprays into both nostrils daily as needed for rhinitis. Marland Kitchen  montelukast  (SINGULAIR) 10 MG tablet, Take 1 tablet (10 mg total) by mouth at bedtime.  Current Outpatient Prescriptions (Analgesics):  .  RA ASPIRIN EC ADULT LOW ST 81 MG EC tablet, take 1 tablet by mouth once daily .  [START ON 08/21/2016] HYDROcodone-acetaminophen (NORCO/VICODIN) 5-325 MG tablet, Take 0.5-1 tablets by mouth 2 (two) times daily as needed for severe pain. Derrill Memo ON 09/20/2016] HYDROcodone-acetaminophen (NORCO/VICODIN) 5-325 MG tablet, Take 0.5-1 tablets by mouth 2 (two) times daily as needed for severe pain. Derrill Memo ON 10/21/2016] HYDROcodone-acetaminophen (NORCO/VICODIN) 5-325 MG tablet, Take 0.5-1 tablets by mouth 2 (two) times daily as needed for severe pain.   Current Outpatient Prescriptions (Other):  Marland Kitchen  buPROPion (WELLBUTRIN XL) 300 MG 24 hr tablet, Take 300 mg by mouth daily. Marland Kitchen  lamoTRIgine (LAMICTAL) 200 MG tablet, Take 300 mg by mouth at bedtime.  Marland Kitchen  lisdexamfetamine (VYVANSE) 20 MG capsule, Take 20 mg by mouth daily. Marland Kitchen  LORazepam (ATIVAN) 1 MG tablet, Take 0.5-1 mg by mouth every 8 (eight) hours as needed for anxiety or sleep.  .  pantoprazole (PROTONIX) 40 MG tablet, take 1 tablet by mouth once daily .  PARoxetine (PAXIL-CR) 25 MG 24 hr tablet, Take 50 mg by mouth at bedtime.  Derrill Memo ON 08/21/2016] metaxalone (SKELAXIN) 800 MG tablet, Take 1 tablet (800 mg total) by mouth 3 (three) times daily as needed for muscle spasms.  ROS  Constitutional: Denies any fever or chills Gastrointestinal: No reported hemesis, hematochezia, vomiting, or acute GI distress Musculoskeletal: Denies any acute onset joint swelling, redness, loss of ROM, or weakness Neurological: No reported episodes of acute onset apraxia, aphasia, dysarthria, agnosia, amnesia, paralysis, loss of coordination, or loss of consciousness  Allergies  Debra Myers is allergic to oxycodone; penicillins; and sulfa antibiotics.  [place]  Drug: Debra Myers  reports that she does not use drugs. Alcohol:  reports that she does  not drink alcohol. Tobacco:  reports that she has never smoked. She has never used smokeless tobacco. Medical:  has a past medical history of Anxiety; Asthma; Bulging lumbar disc (L3-4) (02/26/2015); Degenerative disc disease; Degenerative lumbar disc; Depression; Diabetes mellitus; Elective abortion; GERD (gastroesophageal reflux disease); Hypertension; Stroke [facility]) (06/11/2015); and Vaginal delivery. Surgical: Debra Myers  has a past surgical history that includes Cholecystectomy (2001); Lapband (04/2012); Wisdom tooth extraction; and Dilatation & curettage/hysteroscopy with myosure (N/A, 09/10/2014). Family: family history includes Asthma in her maternal grandmother; Diabetes in her father; Hypertension in her father and mother; Stroke in her maternal grandfather and maternal grandmother.  Constitutional Exam  General appearance: Well nourished, well developed, and well hydrated. In no apparent acute distress Vitals:   08/19/16 1354  BP: (!) 154/74  Pulse: (!) 106  Resp: 16  Temp: 98.2 F (36.8 C)  TempSrc: Oral  SpO2: 100%  Weight: 262 lb (118.8 kg)  Height: '5\' 4"'  (1.626 m)   BMI Assessment: Estimated body mass index is 44.97 kg/m as calculated from the following:   Height as of this encounter: '5\' 4"'  (1.626 m).   Weight as of this encounter: 262 lb (118.8 kg).  BMI interpretation table: BMI level Category Range association with higher incidence of chronic pain  <18 kg/m2 Underweight   18.5-24.9 kg/m2 Ideal body weight   25-29.9 kg/m2 Overweight Increased incidence by 20%  30-34.9 kg/m2 Obese (Class I) Increased incidence by 68%  35-39.9 kg/m2 Severe obesity (Class II) Increased incidence by 136%  >40 kg/m2 Extreme obesity (Class III) Increased incidence by 254%   BMI Readings from Last 4 Encounters:  08/19/16 44.97 kg/m  07/13/16 44.55 kg/m  07/02/16 44.63 kg/m  04/29/16 43.77 kg/m   Wt Readings from Last 4 Encounters:  08/19/16 262 lb (118.8 kg)  07/13/16 259 lb 9 oz  (117.7 kg)  07/02/16 260 lb (117.9 kg)  04/29/16 255 lb (115.7 kg)  Psych/Mental status: Alert, oriented x 3 (person, place, & time)       Eyes: PERLA Respiratory: No evidence of acute respiratory distress  Cervical Spine Exam  Inspection: No masses, redness, or swelling Alignment: Symmetrical Functional ROM: Unrestricted ROM      Stability: No instability detected Muscle strength & Tone: Functionally intact Sensory: Unimpaired Palpation: No palpable anomalies              Upper Extremity (UE) Exam    Side: Right upper extremity  Side: Left upper extremity  Inspection: No masses, redness, swelling, or asymmetry. No contractures  Inspection: No masses, redness, swelling, or asymmetry. No contractures  Functional ROM: Unrestricted ROM          Functional ROM: Unrestricted ROM          Muscle strength & Tone: Functionally intact  Muscle strength & Tone: Functionally intact  Sensory: Unimpaired  Sensory: Unimpaired  Palpation: No palpable anomalies              Palpation: No palpable anomalies              Specialized Test(s): Deferred         Specialized Test(s): Deferred          Thoracic Spine Exam  Inspection: No masses, redness, or swelling Alignment: Symmetrical Functional ROM: Unrestricted ROM Stability: No instability detected Sensory: Unimpaired Muscle strength & Tone: No palpable anomalies  Lumbar Spine Exam  Inspection: No masses, redness, or swelling Alignment: Symmetrical Functional ROM: Unrestricted ROM      Stability: No instability detected Muscle strength & Tone: Functionally intact Sensory: Unimpaired Palpation: No palpable anomalies       Provocative Tests: Lumbar Hyperextension and rotation test: evaluation deferred today       Patrick's Maneuver: evaluation deferred today                    Gait & Posture Assessment  Ambulation: Unassisted Gait: Relatively normal for age and body habitus Posture: WNL   Lower Extremity Exam    Side: Right lower  extremity  Side: Left lower extremity  Inspection: No masses, redness, swelling, or asymmetry. No contractures  Inspection: No masses, redness, swelling, or asymmetry. No contractures  Functional ROM: Unrestricted ROM          Functional ROM: Unrestricted ROM          Muscle strength & Tone: Functionally intact  Muscle strength & Tone: Functionally intact  Sensory: Unimpaired  Sensory: Unimpaired  Palpation: No palpable anomalies  Palpation: No palpable anomalies   Assessment  Primary Diagnosis & Pertinent Problem List: The primary encounter diagnosis was Lumbar facet syndrome (Location of Primary Source of Pain) (Bilateral) (L>R). Diagnoses of Lumbar spondylosis (Bulging Disc & Severe Left Foraminal Stenosis at L3-4), Muscle spasm, nocturnal, Musculoskeletal pain, and Chronic pain syndrome were also pertinent to this visit.  Status Diagnosis  Controlled Controlled Controlled 1. Lumbar facet syndrome (Location of Primary Source of Pain) (Bilateral) (L>R)   2. Lumbar spondylosis (Bulging Disc & Severe Left Foraminal Stenosis at L3-4)   3. Muscle spasm, nocturnal   4. Musculoskeletal pain   5. Chronic pain syndrome     Problems updated and reviewed during this visit: No problems updated. Plan of Care  Pharmacotherapy (Medications Ordered): Meds ordered this encounter  Medications  . HYDROcodone-acetaminophen (NORCO/VICODIN) 5-325 MG tablet    Sig: Take 0.5-1 tablets by mouth 2 (two) times daily as needed for severe pain.    Dispense:  60 tablet    Refill:  0    Do not add this medication to the electronic "Automatic Refill" notification system. Patient may have prescription filled one day early if pharmacy is closed on scheduled refill date. Do not fill until: 08/21/16 To last until: 09/20/16    Order Specific Question:   Supervising Provider    Answer:   Milinda Pointer [phone]  . metaxalone (SKELAXIN) 800 MG tablet    Sig: Take 1 tablet (800 mg total) by mouth 3 (three) times  daily as  needed for muscle spasms.    Dispense:  90 tablet    Refill:  2    Do not add this medication to the electronic "Automatic Refill" notification system. Patient may have prescription filled one day early if pharmacy is closed on scheduled refill date.    Order Specific Question:   Supervising Provider    Answer:   Milinda Pointer [phone]  . HYDROcodone-acetaminophen (NORCO/VICODIN) 5-325 MG tablet    Sig: Take 0.5-1 tablets by mouth 2 (two) times daily as needed for severe pain.    Dispense:  60 tablet    Refill:  0    Do not add this medication to the electronic "Automatic Refill" notification system. Patient may have prescription filled one day early if pharmacy is closed on scheduled refill date. Do not fill until: 09/20/16 To last until: 10/21/16    Order Specific Question:   Supervising Provider    Answer:   Milinda Pointer [phone]  . HYDROcodone-acetaminophen (NORCO/VICODIN) 5-325 MG tablet    Sig: Take 0.5-1 tablets by mouth 2 (two) times daily as needed for severe pain.    Dispense:  60 tablet    Refill:  0    Do not add this medication to the electronic "Automatic Refill" notification system. Patient may have prescription filled one day early if pharmacy is closed on scheduled refill date. Do not fill until:10/21/16 To last until: 11/20/16    Order Specific Question:   Supervising Provider    Answer:   Milinda Pointer [phone]   New Prescriptions   No medications on file   Medications administered today: Debra Myers had no medications administered during this visit. Lab-work, procedure(s), and/or referral(s): No orders of the defined types were placed in this encounter.  Imaging and/or referral(s): None  Interventional therapies: Planned, scheduled, and/or pending:   None at this time secondary to the patient's body habitus    Considering:   Diagnostic left L3-4 lumbar epidural steroid injection  Diagnostic bilateral lumbar facet block  Possible  bilateral lumbar facet radiofrequency ablation.  Diagnostic left-sided cervical epidural steroid injection  Diagnostic bilateral cervical facet block  Possible bilateral cervical facet radiofrequency ablation.    Palliative PRN treatment(s):   Diagnostic left L3-4 lumbar epidural steroid injection  Diagnostic bilateral lumbar facet block  Diagnostic left-sided cervical epidural steroid injection  Diagnostic bilateral cervical facet block    Provider-requested follow-up: Return in about 3 months (around 11/19/2016) for MedMgmt.  Future Appointments Date Time Provider [place]  11/18/2016 8:45 AM Vevelyn Francois, NP [facility] None   Primary Care Physician: Lucille Passy, MD Location: [facility] Note by: Vevelyn Francois NP Date: 08/19/2016; Time: 2:43 PM  Pain Score Disclaimer: We use the NRS-11 scale. This is a self-reported, subjective measurement of pain severity with only modest accuracy. It is used primarily to identify changes within a particular patient. It must be understood that outpatient pain scales are significantly less accurate that those used for research, where they can be applied under ideal controlled circumstances with minimal exposure to variables. In reality, the score is likely to be a combination of pain intensity and pain affect, where pain affect describes the degree of emotional arousal or changes in action readiness caused by the sensory experience of pain. Factors such as social and work situation, setting, emotional state, anxiety levels, expectation, and prior pain experience may influence pain perception and show large inter-individual differences that may also be affected by time variables.  Patient instructions  provided during this appointment: Patient Instructions   ____________________________________________________________________________________________  Medication Rules  Applies to: All patients receiving  prescriptions (written or electronic).  Pharmacy of record: Pharmacy where electronic prescriptions will be sent. If written prescriptions are taken to a different pharmacy, please inform the nursing staff. The pharmacy listed in the electronic medical record should be the one where you would like electronic prescriptions to be sent.  Prescription refills: Only during scheduled appointments. Applies to both, written and electronic prescriptions.  NOTE: The following applies primarily to controlled substances (Opioid* Pain Medications).   Patient's responsibilities: 1. Pain Pills: Bring all pain pills to every appointment (except for procedure appointments). 2. Pill Bottles: Bring pills in original pharmacy bottle. Always bring newest bottle. Bring bottle, even if empty. 3. Medication refills: You are responsible for knowing and keeping track of what medications you need refilled. The day before your appointment, write a list of all prescriptions that need to be refilled. Bring that list to your appointment and give it to the admitting nurse. Prescriptions will be written only during appointments. If you forget a medication, it will not be "Called in", "Faxed", or "electronically sent". You will need to get another appointment to get these prescribed. 4. Prescription Accuracy: You are responsible for carefully inspecting your prescriptions before leaving our office. Have the discharge nurse carefully go over each prescription with you, before taking them home. Make sure that your name is accurately spelled, that your address is correct. Check the name and dose of your medication to make sure it is accurate. Check the number of pills, and the written instructions to make sure they are clear and accurate. Make sure that you are given enough medication to last until your next medication refill appointment. 5. Taking Medication: Take medication as prescribed. Never take more pills than instructed. Never take  medication more frequently than prescribed. Taking less pills or less frequently is permitted and encouraged, when it comes to controlled substances (written prescriptions).  6. Inform other Doctors: Always inform, all of your healthcare providers, of all the medications you take. 7. Pain Medication from other Providers: You are not allowed to accept any additional pain medication from any other Doctor or Healthcare provider. There are two exceptions to this rule. (see below) In the event that you require additional pain medication, you are responsible for notifying us, as stated below. 8. Medication Agreement: You are responsible for carefully reading and following our Medication Agreement. This must be signed before receiving any prescriptions from our practice. Safely store a copy of your signed Agreement. Violations to the Agreement will result in no further prescriptions. (Additional copies of our Medication Agreement are available upon request.) 9. Laws, Rules, & Regulations: All patients are expected to follow all Federal and [employer], Rules, [employer]. Ignorance of the Laws does not constitute a valid excuse. The use of any illegal substances is prohibited. 10. Adopted CDC guidelines & recommendations: Target dosing levels will be at or below 60 MME/day. Use of benzodiazepines** is not recommended.  Exceptions: There are only two exceptions to the rule of not receiving pain medications from other Healthcare Providers. 1. Exception #1 (Emergencies): In the event of an emergency (i.e.: accident requiring emergency care), you are allowed to receive additional pain medication. However, you are responsible for: As soon as you are able, call our office [phone], at any time of the day or night, and leave a message stating your name, the date and nature of the emergency,  and the name and dose of the medication prescribed. In the event that your call is answered by a member of our  staff, make sure to document and save the date, time, and the name of the person that took your information.  2. Exception #2 (Planned Surgery): In the event that you are scheduled by another doctor or dentist to have any type of surgery or procedure, you are allowed (for a period no longer than 30 days), to receive additional pain medication, for the acute post-op pain. However, in this case, you are responsible for picking up a copy of our "Post-op Pain Management for Surgeons" handout, and giving it to your surgeon or dentist. This document is available at our office, and does not require an appointment to obtain it. Simply go to our office during business hours (Monday-Thursday from 8:00 AM to 4:00 PM) (Friday 8:00 AM to 12:00 Noon) or if you have a scheduled appointment with [name], prior to your surgery, and ask for it by name. In addition, you will need to provide [name] with your name, name of your surgeon, type of surgery, and date of procedure or surgery.  *Opioid medications include: morphine, codeine, oxycodone, oxymorphone, hydrocodone, hydromorphone, meperidine, tramadol, tapentadol, buprenorphine, fentanyl, methadone. **Benzodiazepine medications include: diazepam (Valium), alprazolam (Xanax), clonazepam (Klonopine), lorazepam (Ativan), clorazepate (Tranxene), chlordiazepoxide (Librium), estazolam (Prosom), oxazepam (Serax), temazepam (Restoril), triazolam (Halcion)  You were given 3 prescriptions for Hydrocodone and one for Skelaxin today. ____________________________________________________________________________________________

## 2016-08-19 NOTE — Progress Notes (Signed)
Nursing Pain Medication Assessment:  Safety precautions to be maintained throughout the outpatient stay will include: orient to surroundings, keep bed in low position, maintain call bell within reach at all times, provide assistance with transfer out of bed and ambulation.  Medication Inspection Compliance: Pill count conducted under aseptic conditions, in front of the patient. Neither the pills nor the bottle was removed from the patient's sight at any time. Once count was completed pills were immediately returned to the patient in their original bottle.  Medication: Hydrocodone/APAP Pill/Patch Count: 7.5 of 60 pills remain Pill/Patch Appearance: Markings consistent with prescribed medication Bottle Appearance: Standard pharmacy container. Clearly labeled. Filled Date:05/31/ 2018 Last Medication intake:  Today

## 2016-08-19 NOTE — Patient Instructions (Addendum)
____________________________________________________________________________________________  Medication Rules  Applies to: All patients receiving prescriptions (written or electronic).  Pharmacy of record: Pharmacy where electronic prescriptions will be sent. If written prescriptions are taken to a different pharmacy, please inform the nursing staff. The pharmacy listed in the electronic medical record should be the one where you would like electronic prescriptions to be sent.  Prescription refills: Only during scheduled appointments. Applies to both, written and electronic prescriptions.  NOTE: The following applies primarily to controlled substances (Opioid* Pain Medications).   Patient's responsibilities: 1. Pain Pills: Bring all pain pills to every appointment (except for procedure appointments). 2. Pill Bottles: Bring pills in original pharmacy bottle. Always bring newest bottle. Bring bottle, even if empty. 3. Medication refills: You are responsible for knowing and keeping track of what medications you need refilled. The day before your appointment, write a list of all prescriptions that need to be refilled. Bring that list to your appointment and give it to the admitting nurse. Prescriptions will be written only during appointments. If you forget a medication, it will not be "Called in", "Faxed", or "electronically sent". You will need to get another appointment to get these prescribed. 4. Prescription Accuracy: You are responsible for carefully inspecting your prescriptions before leaving our office. Have the discharge nurse carefully go over each prescription with you, before taking them home. Make sure that your name is accurately spelled, that your address is correct. Check the name and dose of your medication to make sure it is accurate. Check the number of pills, and the written instructions to make sure they are clear and accurate. Make sure that you are given enough medication to  last until your next medication refill appointment. 5. Taking Medication: Take medication as prescribed. Never take more pills than instructed. Never take medication more frequently than prescribed. Taking less pills or less frequently is permitted and encouraged, when it comes to controlled substances (written prescriptions).  6. Inform other Doctors: Always inform, all of your healthcare providers, of all the medications you take. 7. Pain Medication from other Providers: You are not allowed to accept any additional pain medication from any other Doctor or Healthcare provider. There are two exceptions to this rule. (see below) In the event that you require additional pain medication, you are responsible for notifying us, as stated below. 8. Medication Agreement: You are responsible for carefully reading and following our Medication Agreement. This must be signed before receiving any prescriptions from our practice. Safely store a copy of your signed Agreement. Violations to the Agreement will result in no further prescriptions. (Additional copies of our Medication Agreement are available upon request.) 9. Laws, Rules, & Regulations: All patients are expected to follow all Federal and State Laws, Statutes, Rules, & Regulations. Ignorance of the Laws does not constitute a valid excuse. The use of any illegal substances is prohibited. 10. Adopted CDC guidelines & recommendations: Target dosing levels will be at or below 60 MME/day. Use of benzodiazepines** is not recommended.  Exceptions: There are only two exceptions to the rule of not receiving pain medications from other Healthcare Providers. 1. Exception #1 (Emergencies): In the event of an emergency (i.e.: accident requiring emergency care), you are allowed to receive additional pain medication. However, you are responsible for: As soon as you are able, call our office [phone], at any time of the day or night, and leave a message stating your  name, the date and nature of the emergency, and the name and dose of the medication   prescribed. In the event that your call is answered by a member of our staff, make sure to document and save the date, time, and the name of the person that took your information.  2. Exception #2 (Planned Surgery): In the event that you are scheduled by another doctor or dentist to have any type of surgery or procedure, you are allowed (for a period no longer than 30 days), to receive additional pain medication, for the acute post-op pain. However, in this case, you are responsible for picking up a copy of our "Post-op Pain Management for Surgeons" handout, and giving it to your surgeon or dentist. This document is available at our office, and does not require an appointment to obtain it. Simply go to our office during business hours (Monday-Thursday from 8:00 AM to 4:00 PM) (Friday 8:00 AM to 12:00 Noon) or if you have a scheduled appointment with us, prior to your surgery, and ask for it by name. In addition, you will need to provide us with your name, name of your surgeon, type of surgery, and date of procedure or surgery.  *Opioid medications include: morphine, codeine, oxycodone, oxymorphone, hydrocodone, hydromorphone, meperidine, tramadol, tapentadol, buprenorphine, fentanyl, methadone. **Benzodiazepine medications include: diazepam (Valium), alprazolam (Xanax), clonazepam (Klonopine), lorazepam (Ativan), clorazepate (Tranxene), chlordiazepoxide (Librium), estazolam (Prosom), oxazepam (Serax), temazepam (Restoril), triazolam (Halcion)  You were given 3 prescriptions for Hydrocodone and one for Skelaxin today. ____________________________________________________________________________________________

## 2016-09-15 ENCOUNTER — Encounter: Payer: Managed Care, Other (non HMO) | Admitting: Family Medicine

## 2016-09-15 DIAGNOSIS — Z01419 Encounter for gynecological examination (general) (routine) without abnormal findings: Secondary | ICD-10-CM | POA: Insufficient documentation

## 2016-09-18 ENCOUNTER — Other Ambulatory Visit: Payer: Self-pay | Admitting: Family Medicine

## 2016-09-29 ENCOUNTER — Encounter: Payer: Managed Care, Other (non HMO) | Admitting: Family Medicine

## 2016-10-27 ENCOUNTER — Other Ambulatory Visit: Payer: Self-pay | Admitting: Family Medicine

## 2016-11-03 ENCOUNTER — Other Ambulatory Visit: Payer: Self-pay

## 2016-11-03 MED ORDER — MONTELUKAST SODIUM 10 MG PO TABS: 10.0000 mg | ORAL_TABLET | Freq: Every day | ORAL | 3 refills | Status: DC

## 2016-11-03 NOTE — Telephone Encounter (Signed)
Pt left v/m requesting status of singulair refill to [facility] [address]. Pt last seen and rx last refilled # 90 x 3 on 09/17/16.Please advise.

## 2016-11-04 NOTE — Telephone Encounter (Signed)
[name] notified refills on her Singulair was sent to her pharmacy on 11/03/2016.

## 2016-11-18 ENCOUNTER — Encounter: Payer: Self-pay | Admitting: Nurse Practitioner

## 2016-11-29 ENCOUNTER — Ambulatory Visit: Payer: Managed Care, Other (non HMO) | Attending: Nurse Practitioner | Admitting: Nurse Practitioner

## 2016-11-29 ENCOUNTER — Encounter: Payer: Self-pay | Admitting: Nurse Practitioner

## 2016-11-29 VITALS — BP 139/78 | HR 106 | Temp 97.9°F | Resp 16 | Ht 64.0 in | Wt 250.0 lb

## 2016-11-29 DIAGNOSIS — F419 Anxiety disorder, unspecified: Secondary | ICD-10-CM | POA: Diagnosis not present

## 2016-11-29 DIAGNOSIS — M79605 Pain in left leg: Secondary | ICD-10-CM | POA: Diagnosis not present

## 2016-11-29 DIAGNOSIS — Z5181 Encounter for therapeutic drug level monitoring: Secondary | ICD-10-CM | POA: Insufficient documentation

## 2016-11-29 DIAGNOSIS — F329 Major depressive disorder, single episode, unspecified: Secondary | ICD-10-CM | POA: Insufficient documentation

## 2016-11-29 DIAGNOSIS — M48061 Spinal stenosis, lumbar region without neurogenic claudication: Secondary | ICD-10-CM | POA: Diagnosis not present

## 2016-11-29 DIAGNOSIS — M5412 Radiculopathy, cervical region: Secondary | ICD-10-CM | POA: Insufficient documentation

## 2016-11-29 DIAGNOSIS — Z8673 Personal history of transient ischemic attack (TIA), and cerebral infarction without residual deficits: Secondary | ICD-10-CM | POA: Diagnosis not present

## 2016-11-29 DIAGNOSIS — M5126 Other intervertebral disc displacement, lumbar region: Secondary | ICD-10-CM | POA: Diagnosis not present

## 2016-11-29 DIAGNOSIS — Z880 Allergy status to penicillin: Secondary | ICD-10-CM | POA: Insufficient documentation

## 2016-11-29 DIAGNOSIS — Z6841 Body Mass Index (BMI) 40.0 and over, adult: Secondary | ICD-10-CM | POA: Diagnosis not present

## 2016-11-29 DIAGNOSIS — K219 Gastro-esophageal reflux disease without esophagitis: Secondary | ICD-10-CM | POA: Diagnosis not present

## 2016-11-29 DIAGNOSIS — G894 Chronic pain syndrome: Secondary | ICD-10-CM | POA: Diagnosis not present

## 2016-11-29 DIAGNOSIS — M542 Cervicalgia: Secondary | ICD-10-CM | POA: Diagnosis not present

## 2016-11-29 DIAGNOSIS — Z79899 Other long term (current) drug therapy: Secondary | ICD-10-CM | POA: Insufficient documentation

## 2016-11-29 DIAGNOSIS — Z7982 Long term (current) use of aspirin: Secondary | ICD-10-CM | POA: Diagnosis not present

## 2016-11-29 DIAGNOSIS — I10 Essential (primary) hypertension: Secondary | ICD-10-CM | POA: Diagnosis not present

## 2016-11-29 DIAGNOSIS — M47816 Spondylosis without myelopathy or radiculopathy, lumbar region: Secondary | ICD-10-CM | POA: Insufficient documentation

## 2016-11-29 DIAGNOSIS — Z7984 Long term (current) use of oral hypoglycemic drugs: Secondary | ICD-10-CM | POA: Diagnosis not present

## 2016-11-29 DIAGNOSIS — M7918 Myalgia, other site: Secondary | ICD-10-CM

## 2016-11-29 DIAGNOSIS — Z79891 Long term (current) use of opiate analgesic: Secondary | ICD-10-CM

## 2016-11-29 DIAGNOSIS — M545 Low back pain: Secondary | ICD-10-CM

## 2016-11-29 DIAGNOSIS — E119 Type 2 diabetes mellitus without complications: Secondary | ICD-10-CM | POA: Insufficient documentation

## 2016-11-29 DIAGNOSIS — M62838 Other muscle spasm: Secondary | ICD-10-CM | POA: Diagnosis not present

## 2016-11-29 DIAGNOSIS — G8929 Other chronic pain: Secondary | ICD-10-CM | POA: Diagnosis not present

## 2016-11-29 MED ORDER — METAXALONE 800 MG PO TABS: 800.0000 mg | ORAL_TABLET | Freq: Three times a day (TID) | ORAL | 2 refills | Status: DC | PRN

## 2016-11-29 MED ORDER — HYDROCODONE-ACETAMINOPHEN 5-325 MG PO TABS: 0.5000 | ORAL_TABLET | Freq: Two times a day (BID) | ORAL | 0 refills | Status: DC | PRN

## 2016-11-29 NOTE — Patient Instructions (Signed)
____________________________________________________________________________________________  Medication Rules  Applies to: All patients receiving prescriptions (written or electronic).  Pharmacy of record: Pharmacy where electronic prescriptions will be sent. If written prescriptions are taken to a different pharmacy, please inform the nursing staff. The pharmacy listed in the electronic medical record should be the one where you would like electronic prescriptions to be sent.  Prescription refills: Only during scheduled appointments. Applies to both, written and electronic prescriptions.  NOTE: The following applies primarily to controlled substances (Opioid* Pain Medications).   Patient's responsibilities: 1. Pain Pills: Bring all pain pills to every appointment (except for procedure appointments). 2. Pill Bottles: Bring pills in original pharmacy bottle. Always bring newest bottle. Bring bottle, even if empty. 3. Medication refills: You are responsible for knowing and keeping track of what medications you need refilled. The day before your appointment, write a list of all prescriptions that need to be refilled. Bring that list to your appointment and give it to the admitting nurse. Prescriptions will be written only during appointments. If you forget a medication, it will not be "Called in", "Faxed", or "electronically sent". You will need to get another appointment to get these prescribed. 4. Prescription Accuracy: You are responsible for carefully inspecting your prescriptions before leaving our office. Have the discharge nurse carefully go over each prescription with you, before taking them home. Make sure that your name is accurately spelled, that your address is correct. Check the name and dose of your medication to make sure it is accurate. Check the number of pills, and the written instructions to make sure they are clear and accurate. Make sure that you are given enough medication to  last until your next medication refill appointment. 5. Taking Medication: Take medication as prescribed. Never take more pills than instructed. Never take medication more frequently than prescribed. Taking less pills or less frequently is permitted and encouraged, when it comes to controlled substances (written prescriptions).  6. Inform other Doctors: Always inform, all of your healthcare providers, of all the medications you take. 7. Pain Medication from other Providers: You are not allowed to accept any additional pain medication from any other Doctor or Healthcare provider. There are two exceptions to this rule. (see below) In the event that you require additional pain medication, you are responsible for notifying us, as stated below. 8. Medication Agreement: You are responsible for carefully reading and following our Medication Agreement. This must be signed before receiving any prescriptions from our practice. Safely store a copy of your signed Agreement. Violations to the Agreement will result in no further prescriptions. (Additional copies of our Medication Agreement are available upon request.) 9. Laws, Rules, & Regulations: All patients are expected to follow all Federal and State Laws, Statutes, Rules, & Regulations. Ignorance of the Laws does not constitute a valid excuse. The use of any illegal substances is prohibited. 10. Adopted CDC guidelines & recommendations: Target dosing levels will be at or below 60 MME/day. Use of benzodiazepines** is not recommended.  Exceptions: There are only two exceptions to the rule of not receiving pain medications from other Healthcare Providers. 1. Exception #1 (Emergencies): In the event of an emergency (i.e.: accident requiring emergency care), you are allowed to receive additional pain medication. However, you are responsible for: As soon as you are able, call our office [phone], at any time of the day or night, and leave a message stating your  name, the date and nature of the emergency, and the name and dose of the medication   prescribed. In the event that your call is answered by a member of our staff, make sure to document and save the date, time, and the name of the person that took your information.  2. Exception #2 (Planned Surgery): In the event that you are scheduled by another doctor or dentist to have any type of surgery or procedure, you are allowed (for a period no longer than 30 days), to receive additional pain medication, for the acute post-op pain. However, in this case, you are responsible for picking up a copy of our "Post-op Pain Management for Surgeons" handout, and giving it to your surgeon or dentist. This document is available at our office, and does not require an appointment to obtain it. Simply go to our office during business hours (Monday-Thursday from 8:00 AM to 4:00 PM) (Friday 8:00 AM to 12:00 Noon) or if you have a scheduled appointment with [name], prior to your surgery, and ask for it by name. In addition, you will need to provide [name] with your name, name of your surgeon, type of surgery, and date of procedure or surgery.  *Opioid medications include: morphine, codeine, oxycodone, oxymorphone, hydrocodone, hydromorphone, meperidine, tramadol, tapentadol, buprenorphine, fentanyl, methadone. **Benzodiazepine medications include: diazepam (Valium), alprazolam (Xanax), clonazepam (Klonopine), lorazepam (Ativan), clorazepate (Tranxene), chlordiazepoxide (Librium), estazolam (Prosom), oxazepam (Serax), temazepam (Restoril), triazolam (Halcion)  ____________________________________________________________________________________________  BMI Assessment: Estimated body mass index is 42.91 kg/m as calculated from the following:   Height as of this encounter:  (1.626 m).   Weight as of this encounter: 250 lb (113.4 kg).  BMI interpretation table: BMI level Category Range association with higher incidence of chronic pain   <18 kg/m2 Underweight   18.5-24.9 kg/m2 Ideal body weight   25-29.9 kg/m2 Overweight Increased incidence by 20%  30-34.9 kg/m2 Obese (Class I) Increased incidence by 68%  35-39.9 kg/m2 Severe obesity (Class II) Increased incidence by 136%  >40 kg/m2 Extreme obesity (Class III) Increased incidence by 254%   BMI Readings from Last 4 Encounters:  11/29/16 42.91 kg/m  08/19/16 44.97 kg/m  07/13/16 44.55 kg/m  07/02/16 44.63 kg/m   Wt Readings from Last 4 Encounters:  11/29/16 250 lb (113.4 kg)  08/19/16 262 lb (118.8 kg)  07/13/16 259 lb 9 oz (117.7 kg)  07/02/16 260 lb (117.9 kg)

## 2016-11-29 NOTE — Progress Notes (Signed)
Patient's Name: Debra Myers  MRN: [id number]  Referring Provider: Lucille Passy, MD  DOB: 20-May-1965  PCP: Lucille Passy, MD  DOS: 11/29/2016  Note by: Vevelyn Francois NP  Service setting: Ambulatory outpatient  Specialty: Interventional Pain Management  Location: [facility] (AMB) Pain Management Facility    Patient type: Established    Primary Reason(s) for Visit: Encounter for prescription drug management. (Level of risk: moderate)  CC: Back Pain (lower)  HPI  Debra Myers is a 51 y.o. year old, female patient, who comes today for a medication management evaluation. She has GOITER, NONTOXIC MULTINODULAR; Diabetes ([place]); Polycystic ovaries; HYPERTRIGLYCERIDEMIA; Metabolic Syndrome X; OBESITY; Anxiety; Allergic rhinitis; Asthma; Hirsutism; Generalized anxiety disorder; Hypertension; Overweight(278.02); GERD (gastroesophageal reflux disease); IUD (intrauterine device) in place; Opiate use (10 MME/Day); Long term prescription opiate use; Long term current use of opiate analgesic; Encounter for therapeutic drug level monitoring; Encounter for chronic pain management; Numbness of upper extremity; Radiculitis involving upper extremity; Chronic low back pain (Location of Primary Source of Pain) (Bilateral) (L>R); Lumbar spondylosis (Bulging Disc & Severe Left Foraminal Stenosis at L3-4); Chronic lower extremity pain (Location of Secondary source of pain) (Left); Lumbar foraminal stenosis (Severe Left L3-4); Lumbar facet syndrome (Location of Primary Source of Pain) (Bilateral) (L>R); Cervical spondylosis (C5-6 & C6-7 DDD); Cervical (3 mm) Grade 1 Anterolisthesis of C4 over C5; Chronic neck pain (Location of Tertiary source of pain) (Bilateral) (L>R); Chronic cervical radicular pain (Bilateral) (L>R); Cervical foraminal stenosis (multilevel) (Bilateral); TIA (transient ischemic attack); Muscle spasm, nocturnal; Musculoskeletal pain; Carpal tunnel syndrome (Bilateral) (L>R); Cervical facet syndrome (Location of Tertiary  source of pain) (Bilateral) (L>R); Chronic pain syndrome; Morbid obesity with BMI of 40.0-44.9, adult ([place]); and Well woman exam on her problem list. Her primarily concern today is the Back Pain (lower)  Pain Assessment: Location:  (New pain for last 1.5 weeks) Back Radiating: hips bilaterally right is new onset- hips bilateral Onset: More than a month ago Duration: Chronic pain Quality: Discomfort, Aching, Tender, Stabbing Severity: 3 /10 (self-reported pain score)  Note: Reported level is compatible with observation.                    Effect on ADL: sleeping in recliner, prolonged walking, prlonged sitting Timing: Constant Modifying factors: Chiropactor, ice, streching, change postions  Debra Myers was last scheduled for an appointment on 08/19/2016 for medication management. During today's appointment we reviewed Debra Myers chronic pain status, as well as her outpatient medication regimen. She feels like the right side pain that is getting worse. She is not sure what happened. She is going to the [facility]. She does follow them for alignment every 2 weeks.   The patient  reports that she does not use drugs. Her body mass index is 42.91 kg/m.  Further details on both, my assessment(s), as well as the proposed treatment plan, please see below.  Controlled Substance Pharmacotherapy Assessment REMS (Risk Evaluation and Mitigation Strategy)  Analgesic:Hydrocodone/APAP 5/325 2 tablets per day (10 mg/day) MME/day:10 mg/day   Ignatius Specking, RN  11/29/2016  1:50 PM  Sign at close encounter Nursing Pain Medication Assessment:  Safety precautions to be maintained throughout the outpatient stay will include: orient to surroundings, keep bed in low position, maintain call bell within reach at all times, provide assistance with transfer out of bed and ambulation.  Medication Inspection Compliance: Pill count conducted under aseptic conditions, in front of the patient. Neither the pills nor  the bottle was removed from  the patient's sight at any time. Once count was completed pills were immediately returned to the patient in their original bottle.  Medication: See above Pill/Patch Count: 31 of 60 pills remain Pill/Patch Appearance: Markings consistent with prescribed medication Bottle Appearance: Standard pharmacy container. Clearly labeled. Filled Date: 09 / 23 / 2018 Last Medication intake:  Today   Pharmacokinetics: Liberation and absorption (onset of action): WNL Distribution (time to peak effect): WNL Metabolism and excretion (duration of action): WNL         Pharmacodynamics: Desired effects: Analgesia: Debra Myers reports >50% benefit. Functional ability: Patient reports that medication allows her to accomplish basic ADLs Clinically meaningful improvement in function (CMIF): Sustained CMIF goals met Perceived effectiveness: Described as relatively effective, allowing for increase in activities of daily living (ADL) Undesirable effects: Side-effects or Adverse reactions: None reported Monitoring: [place] PMP: Online review of the past 91-monthperiod conducted. Compliant with practice rules and regulations Last UDS on record: No results found for: SUMMARY UDS interpretation: Compliant          Medication Assessment Form: Reviewed. Patient indicates being compliant with therapy Treatment compliance: Compliant Risk Assessment Profile: Aberrant behavior: See prior evaluations. None observed or detected today Comorbid factors increasing risk of overdose: See prior notes. No additional risks detected today Risk of substance use disorder (SUD): Low     Opioid Risk Tool - 11/29/16 1342      Family History of Substance Abuse   Alcohol Negative   Illegal Drugs Negative   Rx Drugs Negative     Personal History of Substance Abuse   Alcohol Negative   Illegal Drugs Negative   Rx Drugs Negative     History of Preadolescent Sexual Abuse   History of Preadolescent Sexual  Abuse Negative or Female     Psychological Disease   Psychological Disease Positive   ADD Negative   OCD Negative   Bipolar Negative   Schizophrenia Negative   Depression Positive     Total Score   Opioid Risk Tool Scoring 3   Opioid Risk Interpretation Low Risk     ORT Scoring interpretation table:  Score <3 = Low Risk for SUD  Score between 4-7 = Moderate Risk for SUD  Score >8 = High Risk for Opioid Abuse   Risk Mitigation Strategies:  Patient Counseling: Covered Patient-Prescriber Agreement (PPA): Present and active  Notification to other healthcare providers: Done  Pharmacologic Plan: No change in therapy, at this time  Laboratory Chemistry  Inflammation Markers (CRP: Acute Phase) (ESR: Chronic Phase) No results found for: CRP, ESRSEDRATE               Renal Function Markers Lab Results  Component Value Date   BUN 12 04/04/2016   CREATININE 0.88 04/04/2016   GFRAA >60 04/04/2016   GFRNONAA >60 04/04/2016                 Hepatic Function Markers Lab Results  Component Value Date   AST 16 10/31/2015   ALT 16 10/31/2015   ALBUMIN 4.4 10/31/2015   ALKPHOS 125 (H) 10/31/2015   HCVAB NEGATIVE 05/18/2013                 Electrolytes Lab Results  Component Value Date   NA 137 04/04/2016   K 4.3 04/04/2016   CL 104 04/04/2016   CALCIUM 9.2 04/04/2016                 Neuropathy Markers No results found for: [id number]  Bone Pathology Markers Lab Results  Component Value Date   ALKPHOS 125 (H) 10/31/2015   VD25OH 44 05/18/2013   CALCIUM 9.2 04/04/2016                 Coagulation Parameters Lab Results  Component Value Date   INR 1.10 06/11/2015   LABPROT 14.4 06/11/2015   APTT 33 06/11/2015   PLT 281 04/04/2016                 Cardiovascular Markers Lab Results  Component Value Date   HGB 11.8 (L) 04/04/2016   HCT 36.8 04/04/2016                 Note: Lab results reviewed.  Recent Diagnostic Imaging Results  US  Transvaginal Non-OB Patient is asymptomatic ultrasound today demonstrated following: Uterus measured 8.2 x 4.3 0.6 cm endometrial stripe 4.1 mm. IUD was seen  in the normal position. Right ovary continued presence of a solid  echogenic focus 13 x 12 mm negative color flow. Left ovary thinwall  echo-free cyst measuring 32 x 34 x 29 mm oversize 3.2 cm negative color  flow. Since had some slight low level echoes 13 x 10 mm. Negative color  flow Doppler no fluid in the cul-de-sac.  Complexity Note: Imaging results reviewed. Results shared with Debra Myers, using Layman's terms.                         Meds   Current Outpatient Prescriptions:  .  atorvastatin (LIPITOR) 10 MG tablet, Take 1 tablet (10 mg total) by mouth daily., Disp: 30 tablet, Rfl: 5 .  buPROPion (WELLBUTRIN XL) 300 MG 24 hr tablet, Take 300 mg by mouth daily., Disp: , Rfl:  .  cetirizine (ZYRTEC) 10 MG tablet, Take 10 mg by mouth at bedtime., Disp: , Rfl:  .  fluticasone (FLONASE) 50 MCG/ACT nasal spray, Place 2 sprays into both nostrils daily as needed for rhinitis., Disp: 16 g, Rfl: 1 .  lamoTRIgine (LAMICTAL) 200 MG tablet, Take 300 mg by mouth at bedtime. , Disp: , Rfl:  .  lisdexamfetamine (VYVANSE) 20 MG capsule, Take 20 mg by mouth daily., Disp: , Rfl:  .  LORazepam (ATIVAN) 1 MG tablet, Take 0.5-1 mg by mouth every 8 (eight) hours as needed for anxiety or sleep. , Disp: , Rfl:  .  metFORMIN (GLUCOPHAGE) 500 MG tablet, take 1 tablet by mouth once daily with BREAKFAST, Disp: 90 tablet, Rfl: 0 .  montelukast (SINGULAIR) 10 MG tablet, Take 1 tablet (10 mg total) by mouth at bedtime., Disp: 90 tablet, Rfl: 3 .  pantoprazole (PROTONIX) 40 MG tablet, take 1 tablet by mouth once daily, Disp: 90 tablet, Rfl: 3 .  PARoxetine (PAXIL-CR) 25 MG 24 hr tablet, Take 50 mg by mouth at bedtime. , Disp: , Rfl:  .  RA ASPIRIN EC ADULT LOW ST 81 MG EC tablet, take 1 tablet by mouth once daily, Disp: 30 tablet, Rfl: 0 .  spironolactone  (ALDACTONE) 25 MG tablet, take 1 tablet by mouth once daily, Disp: 90 tablet, Rfl: 0 .  HYDROcodone-acetaminophen (NORCO/VICODIN) 5-325 MG tablet, Take 0.5-1 tablets by mouth 2 (two) times daily as needed for severe pain., Disp: 60 tablet, Rfl: 0 .  [START ON 12/29/2016] HYDROcodone-acetaminophen (NORCO/VICODIN) 5-325 MG tablet, Take 0.5-1 tablets by mouth 2 (two) times daily as needed for severe pain., Disp: 60 tablet, Rfl: 0 .  [START ON 01/28/2017] HYDROcodone-acetaminophen (NORCO/VICODIN) 5-325 MG  tablet, Take 0.5-1 tablets by mouth 2 (two) times daily as needed for severe pain., Disp: 60 tablet, Rfl: 0 .  metaxalone (SKELAXIN) 800 MG tablet, Take 1 tablet (800 mg total) by mouth 3 (three) times daily as needed for muscle spasms., Disp: 90 tablet, Rfl: 2  ROS  Constitutional: Denies any fever or chills Gastrointestinal: No reported hemesis, hematochezia, vomiting, or acute GI distress Musculoskeletal: Denies any acute onset joint swelling, redness, loss of ROM, or weakness Neurological: No reported episodes of acute onset apraxia, aphasia, dysarthria, agnosia, amnesia, paralysis, loss of coordination, or loss of consciousness  Allergies  Debra Myers is allergic to oxycodone; penicillins; and sulfa antibiotics.  [place]  Drug: Debra Myers  reports that she does not use drugs. Alcohol:  reports that she does not drink alcohol. Tobacco:  reports that she has never smoked. She has never used smokeless tobacco. Medical:  has a past medical history of Anxiety; Asthma; Bulging lumbar disc (L3-4) (02/26/2015); Degenerative disc disease; Degenerative lumbar disc; Depression; Diabetes mellitus; Elective abortion; GERD (gastroesophageal reflux disease); Hypertension; Stroke [facility]) (06/11/2015); and Vaginal delivery. Surgical: Debra Myers  has a past surgical history that includes Cholecystectomy (2001); Lapband (04/2012); Wisdom tooth extraction; and Dilatation & curettage/hysteroscopy with myosure (N/A,  09/10/2014). Family: family history includes Asthma in her maternal grandmother; Diabetes in her father; Hypertension in her father and mother; Stroke in her maternal grandfather and maternal grandmother.  Constitutional Exam  General appearance: Well nourished, well developed, and well hydrated. In no apparent acute distress Vitals:   11/29/16 1329 11/29/16 1331  BP:  139/78  Pulse:  (!) 106  Resp:  16  Temp:  97.9 F (36.6 C)  SpO2:  100%  Weight: 250 lb (113.4 kg)   Height: '5\' 4"'  (1.626 m)    BMI Assessment: Estimated body mass index is 42.91 kg/m as calculated from the following:   Height as of this encounter: '5\' 4"'  (1.626 m).   Weight as of this encounter: 250 lb (113.4 kg). Psych/Mental status: Alert, oriented x 3 (person, place, & time)       Eyes: PERLA Respiratory: No evidence of acute respiratory distress  Cervical Spine Area Exam  Skin & Axial Inspection: No masses, redness, edema, swelling, or associated skin lesions Alignment: Symmetrical Functional ROM: Unrestricted ROM      Stability: No instability detected Muscle Tone/Strength: Functionally intact. No obvious neuro-muscular anomalies detected. Sensory (Neurological): Unimpaired Palpation: No palpable anomalies              Upper Extremity (UE) Exam    Side: Right upper extremity  Side: Left upper extremity  Skin & Extremity Inspection: Skin color, temperature, and hair growth are WNL. No peripheral edema or cyanosis. No masses, redness, swelling, asymmetry, or associated skin lesions. No contractures.  Skin & Extremity Inspection: Skin color, temperature, and hair growth are WNL. No peripheral edema or cyanosis. No masses, redness, swelling, asymmetry, or associated skin lesions. No contractures.  Functional ROM: Unrestricted ROM          Functional ROM: Unrestricted ROM          Muscle Tone/Strength: Functionally intact. No obvious neuro-muscular anomalies detected.  Muscle Tone/Strength: Functionally intact. No  obvious neuro-muscular anomalies detected.  Sensory (Neurological): Unimpaired          Sensory (Neurological): Unimpaired          Palpation: No palpable anomalies              Palpation: No palpable anomalies  Specialized Test(s): Deferred         Specialized Test(s): Deferred          Thoracic Spine Area Exam  Skin & Axial Inspection: No masses, redness, or swelling Alignment: Symmetrical Functional ROM: Unrestricted ROM Stability: No instability detected Muscle Tone/Strength: Functionally intact. No obvious neuro-muscular anomalies detected. Sensory (Neurological): Unimpaired Muscle strength & Tone: No palpable anomalies  Lumbar Spine Area Exam  Skin & Axial Inspection: No masses, redness, or swelling Alignment: Symmetrical Functional ROM: Unrestricted ROM      Stability: No instability detected Muscle Tone/Strength: Functionally intact. No obvious neuro-muscular anomalies detected. Sensory (Neurological): Unimpaired Palpation: Complains of area being tender to palpation       Provocative Tests: Lumbar Hyperextension and rotation test: Positive bilaterally for facet joint pain. Lumbar Lateral bending test: evaluation deferred today       Patrick's Maneuver: evaluation deferred today                    Gait & Posture Assessment  Ambulation: Unassisted Gait: Relatively normal for age and body habitus Posture: WNL   Lower Extremity Exam    Side: Right lower extremity  Side: Left lower extremity  Skin & Extremity Inspection: Skin color, temperature, and hair growth are WNL. No peripheral edema or cyanosis. No masses, redness, swelling, asymmetry, or associated skin lesions. No contractures.  Skin & Extremity Inspection: Skin color, temperature, and hair growth are WNL. No peripheral edema or cyanosis. No masses, redness, swelling, asymmetry, or associated skin lesions. No contractures.  Functional ROM: Unrestricted ROM          Functional ROM: Unrestricted ROM           Muscle Tone/Strength: Functionally intact. No obvious neuro-muscular anomalies detected.  Muscle Tone/Strength: Functionally intact. No obvious neuro-muscular anomalies detected.  Sensory (Neurological): Unimpaired  Sensory (Neurological): Unimpaired  Palpation: No palpable anomalies  Palpation: No palpable anomalies   Assessment  Primary Diagnosis & Pertinent Problem List: The primary encounter diagnosis was Chronic low back pain (Location of Primary Source of Pain) (Bilateral) (L>R). Diagnoses of Chronic lower extremity pain (Location of Secondary source of pain) (Left), Chronic neck pain (Location of Tertiary source of pain) (Bilateral) (L>R), Chronic pain syndrome, Long term prescription opiate use, Muscle spasm, nocturnal, and Musculoskeletal pain were also pertinent to this visit.  Status Diagnosis  Having a Flare-up Controlled Controlled 1. Chronic low back pain (Location of Primary Source of Pain) (Bilateral) (L>R)   2. Chronic lower extremity pain (Location of Secondary source of pain) (Left)   3. Chronic neck pain (Location of Tertiary source of pain) (Bilateral) (L>R)   4. Chronic pain syndrome   5. Long term prescription opiate use   6. Muscle spasm, nocturnal   7. Musculoskeletal pain     Problems updated and reviewed during this visit: No problems updated. Plan of Care  Pharmacotherapy (Medications Ordered): Meds ordered this encounter  Medications  . HYDROcodone-acetaminophen (NORCO/VICODIN) 5-325 MG tablet    Sig: Take 0.5-1 tablets by mouth 2 (two) times daily as needed for severe pain.    Dispense:  60 tablet    Refill:  0    Do not add this medication to the electronic "Automatic Refill" notification system. Patient may have prescription filled one day early if pharmacy is closed on scheduled refill date. Do not fill until:11/29/2016 To last until: 12/29/2016    Order Specific Question:   Supervising Provider    Answer:   Milinda Pointer [phone]  .  HYDROcodone-acetaminophen (NORCO/VICODIN) 5-325 MG tablet    Sig: Take 0.5-1 tablets by mouth 2 (two) times daily as needed for severe pain.    Dispense:  60 tablet    Refill:  0    Do not add this medication to the electronic "Automatic Refill" notification system. Patient may have prescription filled one day early if pharmacy is closed on scheduled refill date. Do not fill until:12/29/2016 To last until: 01/28/2017    Order Specific Question:   Supervising Provider    Answer:   Milinda Pointer [phone]  . HYDROcodone-acetaminophen (NORCO/VICODIN) 5-325 MG tablet    Sig: Take 0.5-1 tablets by mouth 2 (two) times daily as needed for severe pain.    Dispense:  60 tablet    Refill:  0    Do not add this medication to the electronic "Automatic Refill" notification system. Patient may have prescription filled one day early if pharmacy is closed on scheduled refill date. Do not fill until:01/28/2017 To last until:02/27/2017    Order Specific Question:   Supervising Provider    Answer:   Milinda Pointer [phone]  . metaxalone (SKELAXIN) 800 MG tablet    Sig: Take 1 tablet (800 mg total) by mouth 3 (three) times daily as needed for muscle spasms.    Dispense:  90 tablet    Refill:  2    Do not add this medication to the electronic "Automatic Refill" notification system. Patient may have prescription filled one day early if pharmacy is closed on scheduled refill date.    Order Specific Question:   Supervising Provider    Answer:   Milinda Pointer [[id number]]   New Prescriptions   No medications on file   Medications administered today: Debra Myers had no medications administered during this visit. Lab-work, procedure(s), and/or referral(s): Orders Placed This Encounter  Procedures  . ToxASSURE Select 13 (MW), Urine   Imaging and/or referral(s): None  Interventional therapies: Planned, scheduled, and/or pending:  None at this time secondary to the patient's body habitus     Considering:  Diagnostic left L3-4 lumbar epidural steroid injection  Diagnostic bilateral lumbar facet block  Possible bilateral lumbar facet radiofrequency ablation.  Diagnostic left-sided cervical epidural steroid injection  Diagnostic bilateral cervical facet block  Possible bilateral cervical facet radiofrequency ablation.    Palliative PRN treatment(s):  Diagnostic left L3-4 lumbar epidural steroid injection  Diagnostic bilateral lumbar facet block  Diagnostic left-sided cervical epidural steroid injection  Diagnostic bilateral cervical facet block     Provider-requested follow-up: Return in about 3 months (around 03/01/2017) for MedMgmt.  Future Appointments Date Time Provider [place]  12/31/2016 10:30 AM Fontaine, Belinda Block, MD [facility]  03/01/2017 11:30 AM Vevelyn Francois, NP [facility] None   Primary Care Physician: Lucille Passy, MD Location: [facility] Note by: Vevelyn Francois NP Date: 11/29/2016; Time: 2:57 PM  Pain Score Disclaimer: We use the NRS-11 scale. This is a self-reported, subjective measurement of pain severity with only modest accuracy. It is used primarily to identify changes within a particular patient. It must be understood that outpatient pain scales are significantly less accurate that those used for research, where they can be applied under ideal controlled circumstances with minimal exposure to variables. In reality, the score is likely to be a combination of pain intensity and pain affect, where pain affect describes the degree of emotional arousal or changes in action readiness caused by the sensory experience of pain. Factors such as social and work  situation, setting, emotional state, anxiety levels, expectation, and prior pain experience may influence pain perception and show large inter-individual differences that may also be affected by time variables.  Patient instructions provided during this  appointment: Patient Instructions    ____________________________________________________________________________________________  Medication Rules  Applies to: All patients receiving prescriptions (written or electronic).  Pharmacy of record: Pharmacy where electronic prescriptions will be sent. If written prescriptions are taken to a different pharmacy, please inform the nursing staff. The pharmacy listed in the electronic medical record should be the one where you would like electronic prescriptions to be sent.  Prescription refills: Only during scheduled appointments. Applies to both, written and electronic prescriptions.  NOTE: The following applies primarily to controlled substances (Opioid* Pain Medications).   Patient's responsibilities: 1. Pain Pills: Bring all pain pills to every appointment (except for procedure appointments). 2. Pill Bottles: Bring pills in original pharmacy bottle. Always bring newest bottle. Bring bottle, even if empty. 3. Medication refills: You are responsible for knowing and keeping track of what medications you need refilled. The day before your appointment, write a list of all prescriptions that need to be refilled. Bring that list to your appointment and give it to the admitting nurse. Prescriptions will be written only during appointments. If you forget a medication, it will not be "Called in", "Faxed", or "electronically sent". You will need to get another appointment to get these prescribed. 4. Prescription Accuracy: You are responsible for carefully inspecting your prescriptions before leaving our office. Have the discharge nurse carefully go over each prescription with you, before taking them home. Make sure that your name is accurately spelled, that your address is correct. Check the name and dose of your medication to make sure it is accurate. Check the number of pills, and the written instructions to make sure they are clear and accurate. Make sure  that you are given enough medication to last until your next medication refill appointment. 5. Taking Medication: Take medication as prescribed. Never take more pills than instructed. Never take medication more frequently than prescribed. Taking less pills or less frequently is permitted and encouraged, when it comes to controlled substances (written prescriptions).  6. Inform other Doctors: Always inform, all of your healthcare providers, of all the medications you take. 7. Pain Medication from other Providers: You are not allowed to accept any additional pain medication from any other Doctor or Healthcare provider. There are two exceptions to this rule. (see below) In the event that you require additional pain medication, you are responsible for notifying us, as stated below. 8. Medication Agreement: You are responsible for carefully reading and following our Medication Agreement. This must be signed before receiving any prescriptions from our practice. Safely store a copy of your signed Agreement. Violations to the Agreement will result in no further prescriptions. (Additional copies of our Medication Agreement are available upon request.) 9. Laws, Rules, & Regulations: All patients are expected to follow all Federal and [employer], [employer], Rules, [employer]. Ignorance of the Laws does not constitute a valid excuse. The use of any illegal substances is prohibited. 10. Adopted CDC guidelines & recommendations: Target dosing levels will be at or below 60 MME/day. Use of benzodiazepines** is not recommended.  Exceptions: There are only two exceptions to the rule of not receiving pain medications from other Healthcare Providers. 1. Exception #1 (Emergencies): In the event of an emergency (i.e.: accident requiring emergency care), you are allowed to receive additional pain medication. However, you are responsible for: As  soon as you are able, call our office [phone], at any time of the day or  night, and leave a message stating your name, the date and nature of the emergency, and the name and dose of the medication prescribed. In the event that your call is answered by a member of our staff, make sure to document and save the date, time, and the name of the person that took your information.  2. Exception #2 (Planned Surgery): In the event that you are scheduled by another doctor or dentist to have any type of surgery or procedure, you are allowed (for a period no longer than 30 days), to receive additional pain medication, for the acute post-op pain. However, in this case, you are responsible for picking up a copy of our "Post-op Pain Management for Surgeons" handout, and giving it to your surgeon or dentist. This document is available at our office, and does not require an appointment to obtain it. Simply go to our office during business hours (Monday-Thursday from 8:00 AM to 4:00 PM) (Friday 8:00 AM to 12:00 Noon) or if you have a scheduled appointment with [name], prior to your surgery, and ask for it by name. In addition, you will need to provide [name] with your name, name of your surgeon, type of surgery, and date of procedure or surgery.  *Opioid medications include: morphine, codeine, oxycodone, oxymorphone, hydrocodone, hydromorphone, meperidine, tramadol, tapentadol, buprenorphine, fentanyl, methadone. **Benzodiazepine medications include: diazepam (Valium), alprazolam (Xanax), clonazepam (Klonopine), lorazepam (Ativan), clorazepate (Tranxene), chlordiazepoxide (Librium), estazolam (Prosom), oxazepam (Serax), temazepam (Restoril), triazolam (Halcion)  ____________________________________________________________________________________________  BMI Assessment: Estimated body mass index is 42.91 kg/m as calculated from the following:   Height as of this encounter: '5\' 4"'  (1.626 m).   Weight as of this encounter: 250 lb (113.4 kg).  BMI interpretation table: BMI level Category Range  association with higher incidence of chronic pain  <18 kg/m2 Underweight   18.5-24.9 kg/m2 Ideal body weight   25-29.9 kg/m2 Overweight Increased incidence by 20%  30-34.9 kg/m2 Obese (Class I) Increased incidence by 68%  35-39.9 kg/m2 Severe obesity (Class II) Increased incidence by 136%  >40 kg/m2 Extreme obesity (Class III) Increased incidence by 254%   BMI Readings from Last 4 Encounters:  11/29/16 42.91 kg/m  08/19/16 44.97 kg/m  07/13/16 44.55 kg/m  07/02/16 44.63 kg/m   Wt Readings from Last 4 Encounters:  11/29/16 250 lb (113.4 kg)  08/19/16 262 lb (118.8 kg)  07/13/16 259 lb 9 oz (117.7 kg)  07/02/16 260 lb (117.9 kg)

## 2016-11-29 NOTE — Progress Notes (Signed)
Nursing Pain Medication Assessment:  Safety precautions to be maintained throughout the outpatient stay will include: orient to surroundings, keep bed in low position, maintain call bell within reach at all times, provide assistance with transfer out of bed and ambulation.  Medication Inspection Compliance: Pill count conducted under aseptic conditions, in front of the patient. Neither the pills nor the bottle was removed from the patient's sight at any time. Once count was completed pills were immediately returned to the patient in their original bottle.  Medication: See above Pill/Patch Count: 31 of 60 pills remain Pill/Patch Appearance: Markings consistent with prescribed medication Bottle Appearance: Standard pharmacy container. Clearly labeled. Filled Date: 09 / 23 / 2018 Last Medication intake:  Today

## 2016-12-03 LAB — TOXASSURE SELECT 13 (MW), URINE

## 2016-12-09 ENCOUNTER — Telehealth: Payer: Self-pay | Admitting: Family Medicine

## 2016-12-09 NOTE — Telephone Encounter (Addendum)
Pt is not having any CP; pt thinks palpitations due to anxiety. Pt request appt for 12/10/16. Pt is under a lot of stress; pt drinking some caffeine. Pt does not want to go to ED or UC. Pt scheduled appt 12/10/16 and if pt condition worsens prior to appt pt will go to ED.pt not having any palpitations now.

## 2016-12-09 NOTE — Telephone Encounter (Signed)
[facility] - Client TELEPHONE ADVICE RECORD [facility]  Patient Name: Debra FreshwaterROBIN Milham  DOB: 1965/06/24    Initial Comment Caller states she is having heart palpations and when she is under stress it is worse. Caller wants to make an appointment for tomorrow. it comes and goes and states her heart feels odd and feels like it "catches." Caller states she is at work right now.    Nurse Assessment  Nurse: Stefano GaulStringer, RN, Vera Date/Time (Eastern Time): 12/09/2016 12:54:19 PM  Confirm and document reason for call. If symptomatic, describe symptoms. ---Caller states she thinks she is having heart palpitations. She is under a lot of stress. No heart palpitations now. notices heart palpitations mostly in the am. She has appt with a counselor. BP 150/100 and 148/88 recently.  Does the patient have any new or worsening symptoms? ---Yes  Will a triage be completed? ---Yes  Related visit to physician within the last 2 weeks? ---No  Does the PT have any chronic conditions? (i.e. diabetes, asthma, etc.) ---Yes  List chronic conditions. ---HTN; anxiety  Is the patient pregnant or possibly pregnant? (Ask all females between the ages of 7712-55) ---No  Is this a behavioral health or substance abuse call? ---No     Guidelines    Guideline Title Affirmed Question Affirmed Notes  Heart Rate and Heartbeat Questions [1] Heart beating very rapidly (e.g., > 140 / minute) AND [2] not present now (Exception: during exercise)    Final Disposition User   See Physician within 4 Hours (or PCP triage) Stefano GaulStringer, RN, Vera    Comments  Pt states the office has already told her that no appts are available for today. She wants to be seen tomorrow instead. Please call pt back regarding appt   Referrals  GO TO FACILITY REFUSED   Caller Disagree/Comply Disagree  Caller Understands Yes  PreDisposition Call Doctor

## 2016-12-10 ENCOUNTER — Ambulatory Visit (INDEPENDENT_AMBULATORY_CARE_PROVIDER_SITE_OTHER): Payer: Managed Care, Other (non HMO) | Admitting: Family Medicine

## 2016-12-10 ENCOUNTER — Encounter: Payer: Self-pay | Admitting: Family Medicine

## 2016-12-10 VITALS — BP 124/66 | HR 94 | Temp 98.6°F | Wt 252.0 lb

## 2016-12-10 DIAGNOSIS — F419 Anxiety disorder, unspecified: Secondary | ICD-10-CM

## 2016-12-10 DIAGNOSIS — R002 Palpitations: Secondary | ICD-10-CM

## 2016-12-10 LAB — COMPREHENSIVE METABOLIC PANEL
AG Ratio: 1.4 (calc) (ref 1.0–2.5)
ALBUMIN MSPROF: 3.9 g/dL (ref 3.6–5.1)
ALKALINE PHOSPHATASE (APISO): 105 U/L (ref 33–130)
ALT: 12 U/L (ref 6–29)
AST: 13 U/L (ref 10–35)
BILIRUBIN TOTAL: 0.4 mg/dL (ref 0.2–1.2)
BUN: 13 mg/dL (ref 7–25)
CO2: 23 mmol/L (ref 20–32)
CREATININE: 0.85 mg/dL (ref 0.50–1.05)
Calcium: 8.7 mg/dL (ref 8.6–10.4)
Chloride: 106 mmol/L (ref 98–110)
GLOBULIN: 2.7 g/dL (ref 1.9–3.7)
Glucose, Bld: 102 mg/dL — ABNORMAL HIGH (ref 65–99)
POTASSIUM: 4.2 mmol/L (ref 3.5–5.3)
Sodium: 139 mmol/L (ref 135–146)
Total Protein: 6.6 g/dL (ref 6.1–8.1)

## 2016-12-10 LAB — CBC
HEMATOCRIT: 34.9 % — AB (ref 35.0–45.0)
HEMOGLOBIN: 11.6 g/dL — AB (ref 11.7–15.5)
MCH: 26.4 pg — ABNORMAL LOW (ref 27.0–33.0)
MCHC: 33.2 g/dL (ref 32.0–36.0)
MCV: 79.3 fL — AB (ref 80.0–100.0)
MPV: 9.8 fL (ref 7.5–12.5)
Platelets: 279 10*3/uL (ref 140–400)
RBC: 4.4 10*6/uL (ref 3.80–5.10)
RDW: 15.2 % — ABNORMAL HIGH (ref 11.0–15.0)
WBC: 10.6 10*3/uL (ref 3.8–10.8)

## 2016-12-10 LAB — TSH: TSH: 0.49 m[IU]/L

## 2016-12-10 NOTE — Patient Instructions (Addendum)
Work on things that help decrease your stress and anxiety, prioritize sleep, eating mostly whole foods, drinking enough water,  Getting back to gym.   Palpitations A palpitation is the feeling that your heartbeat is irregular or is faster than normal. It may feel like your heart is fluttering or skipping a beat. Palpitations are usually not a serious problem. They may be caused by many things, including smoking, caffeine, alcohol, stress, and certain medicines. Although most causes of palpitations are not serious, palpitations can be a sign of a serious medical problem. In some cases, you may need further medical evaluation. Follow these instructions at home: Pay attention to any changes in your symptoms. Take these actions to help with your condition:  Avoid the following: ? Caffeinated coffee, tea, soft drinks, diet pills, and energy drinks. ? Chocolate. ? Alcohol.  Do not use any tobacco products, such as cigarettes, chewing tobacco, and e-cigarettes. If you need help quitting, ask your health care provider.  Try to reduce your stress and anxiety. Things that can help you relax include: ? Yoga. ? Meditation. ? Physical activity, such as swimming, jogging, or walking. ? Biofeedback. This is a method that helps you learn to use your mind to control things in your body, such as your heartbeats.  Get plenty of rest and sleep.  Take over-the-counter and prescription medicines only as told by your health care provider.  Keep all follow-up visits as told by your health care provider. This is important.  Contact a health care provider if:  You continue to have a fast or irregular heartbeat after 24 hours.  Your palpitations occur more often. Get help right away if:  You have chest pain or shortness of breath.  You have a severe headache.  You feel dizzy or you faint. This information is not intended to replace advice given to you by your health care provider. Make sure you discuss  any questions you have with your health care provider. Document Released: 02/06/2000 Document Revised: 07/14/2015 Document Reviewed: 10/24/2014 Elsevier Interactive Patient Education  2017 [employer]

## 2016-12-10 NOTE — Progress Notes (Signed)
Subjective:    Patient ID: Debra Myers, female    DOB: [birth year], 51 y.o.   MRN: [id number]  HPI This is a 51 yo female who presents today with palpitations. Noticed first last week, has been feeling anxious. Came out of the blue, felt a "quiver," lasted 1. 5 hours off and on. Since last week, has had about 2 more episodes, both in the morning. Feels anxious related to going to work. She has ativan which seemed to calm everything today. Feels worse when at work. Feels like she has to "catch her breath." Last week had a short lasting episode of chest pain. No diaphoresis, no nausea/vomiting, no radiation. Often has musculoskeletal chest pain.  Has history of panic attacks. Takes Vyvanse, thinks 20 mg, not sure if this increases anxiety. Also takes bupropion, lamotrigine, and paroxetine . Sees psychiatrist regularly and is starting back on therapy.  She has been working for [employer] for about 3 months. Had worked at [employer] for many years. Starting new job is stressful and not what she thought it would be like. She is looking for a new job.   Past Medical History:  Diagnosis Date  . Anxiety   . Asthma    ALLERGY INDUCED  . Bulging lumbar disc (L3-4) 02/26/2015  . Degenerative disc disease   . Degenerative lumbar disc   . Depression   . Diabetes mellitus    TYPE 2  . Elective abortion    ONE  . GERD (gastroesophageal reflux disease)    takes prilosec   . Hypertension   . Stroke (HCC) 06/11/2015   patient describes as mini stroke  . Vaginal delivery    ONE NSVD   Past Surgical History:  Procedure Laterality Date  . CHOLECYSTECTOMY  2001  . DILATATION & CURETTAGE/HYSTEROSCOPY WITH MYOSURE N/A 09/10/2014   Procedure: DILATATION & CURETTAGE/HYSTEROSCOPY WITH MYOSURE/INSERTION OF IUD;  Surgeon: Ok Edwards, MD;  Location: [facility];  Service: Gynecology;  Laterality: N/A;  . Lapband  04/2012  . WISDOM TOOTH EXTRACTION     Family History  Problem Relation Age of  Onset  . Hypertension Father   . Diabetes Father   . Hypertension Mother   . Asthma Maternal Grandmother   . Stroke Maternal Grandmother   . Stroke Maternal Grandfather    Social History  Substance Use Topics  . Smoking status: Never Smoker  . Smokeless tobacco: Never Used  . Alcohol use No     Comment: rarely      Review of Systems Per HPI    Objective:   Physical Exam  Constitutional: She is oriented to person, place, and time. She appears well-developed and well-nourished. No distress.  Obese.   HENT:  Head: Normocephalic and atraumatic.  Eyes: Conjunctivae are normal.  Neck: Normal range of motion. Neck supple.  Cardiovascular: Normal rate, regular rhythm and normal heart sounds.   Pulmonary/Chest: Effort normal and breath sounds normal.  Musculoskeletal: She exhibits no edema.  Neurological: She is alert and oriented to person, place, and time.  Skin: Skin is warm and dry. She is not diaphoretic.  Psychiatric: She has a normal mood and affect. Her behavior is normal. Judgment and thought content normal.  Vitals reviewed.    BP 124/66 (BP Location: Right Arm, Patient Position: Sitting, Cuff Size: Large)   Pulse 94   Temp 98.6 F (37 C) (Oral)   Wt 252 lb (114.3 kg)   SpO2 96%   BMI 43.26 kg/m  Wt  Readings from Last 3 Encounters:  12/10/16 252 lb (114.3 kg)  11/29/16 250 lb (113.4 kg)  08/19/16 262 lb (118.8 kg)   EKG- NSR, rate 85     Assessment & Plan:  1. Palpitations - suspect stress/anxiety, could be exacerbated by Vyvanse. Episodes not very regular, EKG normal today. If increased frequency or intensity can consider holter monitor/cardiology referral - EKG 12-Lead - CBC - Comprehensive metabolic panel - TSH - follow up with Dr. Dayton MartesAron if symptoms persist or worsen   2. Anxiety - encouraged her to resume therapy - discuss Vyvanse use with her psychiatrist    Debra Reeeborah Elyssia Strausser, FNP-BC  [facility]  12/14/2016 8:07 AM

## 2016-12-16 ENCOUNTER — Telehealth: Payer: Self-pay | Admitting: Emergency Medicine

## 2016-12-16 NOTE — Telephone Encounter (Signed)
Spoke with patient informing her of results. Understanding verbalized nothing further needed at this time.     Copied from CRM #[id number]. Topic: General - Other >> Dec 16, 2016  8:39 AM Windy KalataMichael, Taylor L, NT wrote: Reason for CRM: pt is returning the call for their lab results

## 2016-12-23 MED ORDER — SPIRONOLACTONE 25 MG PO TABS: 25.0000 mg | ORAL_TABLET | Freq: Every day | ORAL | 0 refills | Status: DC

## 2016-12-25 ENCOUNTER — Other Ambulatory Visit: Payer: Self-pay | Admitting: Family Medicine

## 2016-12-31 ENCOUNTER — Ambulatory Visit (INDEPENDENT_AMBULATORY_CARE_PROVIDER_SITE_OTHER): Payer: Managed Care, Other (non HMO) | Admitting: Gynecology

## 2016-12-31 ENCOUNTER — Encounter: Payer: Self-pay | Admitting: Gynecology

## 2016-12-31 VITALS — BP 136/80 | Ht 64.0 in | Wt 248.0 lb

## 2016-12-31 DIAGNOSIS — N839 Noninflammatory disorder of ovary, fallopian tube and broad ligament, unspecified: Secondary | ICD-10-CM | POA: Diagnosis not present

## 2016-12-31 DIAGNOSIS — Z30431 Encounter for routine checking of intrauterine contraceptive device: Secondary | ICD-10-CM | POA: Diagnosis not present

## 2016-12-31 DIAGNOSIS — Z01419 Encounter for gynecological examination (general) (routine) without abnormal findings: Secondary | ICD-10-CM

## 2016-12-31 NOTE — Patient Instructions (Signed)
Follow-up in 1 year, sooner as needed. 

## 2016-12-31 NOTE — Progress Notes (Signed)
    Debra Myers 08/23/1965 [id number]        51 y.o.  [name] for annual gynecologic exam.  Former patient of Dr. Lily PeerFernandez.  Doing well without complaints.  Past medical history,surgical history, problem list, medications, allergies, family history and social history were all reviewed and documented as reviewed in the EPIC chart.  ROS:  Performed with pertinent positives and negatives included in the history, assessment and plan.   Additional significant findings : None   Exam: Debra Myers assistant Vitals:   12/31/16 1058  BP: 136/80  Weight: 248 lb (112.5 kg)  Height: 5\' 4"  (1.626 m)   Body mass index is 42.57 kg/m.  General appearance:  Normal affect, orientation and appearance. Skin: Grossly normal HEENT: Without gross lesions.  No cervical or supraclavicular adenopathy. Thyroid normal.  Lungs:  Clear without wheezing, rales or rhonchi Cardiac: RR, without RMG Abdominal:  Soft, nontender, without masses, guarding, rebound, organomegaly or hernia Breasts:  Examined lying and sitting without masses, retractions, discharge or axillary adenopathy. Pelvic:  Ext, BUS, Vagina: Normal  Cervix: Normal.  IUD string palpated at external os  Uterus: Difficult to palpate but no gross masses or tenderness  Adnexa: Without gross masses or tenderness    Anus and perineum: Normal   Rectovaginal: Normal sphincter tone without palpated masses or tenderness.    Assessment/Plan:  51 y.o. 562P1011 female for annual gynecologic exam without menses, Mirena IUD.   1. IUD since 09/2014.  Doing well without menses.  No significant perimenopausal symptoms such as hot flushes or night sweats.  [facility] reviewed patient as we would not be acting upon the result we both feel comfortable not checking at this time.  If she does develop significant hot flashes or night sweats then she will represent for evaluation. 2. History of small calcified area right ovary 13 x 12 mm  on ultrasound 01/2016..  Negative color flow.  Negative CA125.  Stable from prior ultrasound 06/2015.  Options to repeat ultrasound now versus following given the stability over 6 months between studies.  Patient comfortable with no further studies which I think is reasonable. 3. Pap smear/HPV 04/2013.  No Pap smear done today.  No history of significant abnormal Pap smears.  Plan repeat Pap smear at 5-year interval per current screening guidelines. 4. Mammography 04/2016.  Continue with annual mammography when due.  SBE monthly reviewed.  Breast exam normal today. 5. Health maintenance.  No routine lab work done as patient does this elsewhere.  Follow-up in 1 year, sooner as needed.   Debra Myers,Vasiliki Smaldone P MD, 11:49 AM 12/31/2016

## 2017-01-24 ENCOUNTER — Other Ambulatory Visit: Payer: Self-pay | Admitting: Neurology

## 2017-02-01 ENCOUNTER — Other Ambulatory Visit: Payer: Self-pay | Admitting: Neurology

## 2017-03-01 ENCOUNTER — Ambulatory Visit: Payer: Managed Care, Other (non HMO) | Admitting: Nurse Practitioner

## 2017-03-03 ENCOUNTER — Other Ambulatory Visit: Payer: Self-pay | Admitting: Neurology

## 2017-03-25 ENCOUNTER — Other Ambulatory Visit: Payer: Self-pay | Admitting: Family Medicine

## 2017-04-06 ENCOUNTER — Encounter: Payer: Self-pay | Admitting: Pain Medicine

## 2017-04-06 ENCOUNTER — Ambulatory Visit: Payer: Managed Care, Other (non HMO) | Attending: Nurse Practitioner | Admitting: Pain Medicine

## 2017-04-06 ENCOUNTER — Other Ambulatory Visit: Payer: Self-pay

## 2017-04-06 VITALS — BP 129/76 | HR 103 | Temp 98.3°F | Resp 16 | Ht 64.0 in | Wt 250.0 lb

## 2017-04-06 DIAGNOSIS — Z7982 Long term (current) use of aspirin: Secondary | ICD-10-CM | POA: Diagnosis not present

## 2017-04-06 DIAGNOSIS — M79605 Pain in left leg: Secondary | ICD-10-CM | POA: Diagnosis not present

## 2017-04-06 DIAGNOSIS — M47816 Spondylosis without myelopathy or radiculopathy, lumbar region: Secondary | ICD-10-CM | POA: Diagnosis not present

## 2017-04-06 DIAGNOSIS — M9983 Other biomechanical lesions of lumbar region: Secondary | ICD-10-CM

## 2017-04-06 DIAGNOSIS — F1190 Opioid use, unspecified, uncomplicated: Secondary | ICD-10-CM

## 2017-04-06 DIAGNOSIS — E119 Type 2 diabetes mellitus without complications: Secondary | ICD-10-CM | POA: Diagnosis not present

## 2017-04-06 DIAGNOSIS — E8881 Metabolic syndrome: Secondary | ICD-10-CM | POA: Diagnosis not present

## 2017-04-06 DIAGNOSIS — Z975 Presence of (intrauterine) contraceptive device: Secondary | ICD-10-CM | POA: Insufficient documentation

## 2017-04-06 DIAGNOSIS — M7918 Myalgia, other site: Secondary | ICD-10-CM | POA: Diagnosis not present

## 2017-04-06 DIAGNOSIS — E042 Nontoxic multinodular goiter: Secondary | ICD-10-CM | POA: Insufficient documentation

## 2017-04-06 DIAGNOSIS — Z6841 Body Mass Index (BMI) 40.0 and over, adult: Secondary | ICD-10-CM | POA: Diagnosis not present

## 2017-04-06 DIAGNOSIS — E282 Polycystic ovarian syndrome: Secondary | ICD-10-CM | POA: Insufficient documentation

## 2017-04-06 DIAGNOSIS — M47812 Spondylosis without myelopathy or radiculopathy, cervical region: Secondary | ICD-10-CM | POA: Diagnosis not present

## 2017-04-06 DIAGNOSIS — M62838 Other muscle spasm: Secondary | ICD-10-CM | POA: Diagnosis not present

## 2017-04-06 DIAGNOSIS — M5010 Cervical disc disorder with radiculopathy, unspecified cervical region: Secondary | ICD-10-CM | POA: Insufficient documentation

## 2017-04-06 DIAGNOSIS — M542 Cervicalgia: Secondary | ICD-10-CM | POA: Diagnosis not present

## 2017-04-06 DIAGNOSIS — M5030 Other cervical disc degeneration, unspecified cervical region: Secondary | ICD-10-CM

## 2017-04-06 DIAGNOSIS — Z79899 Other long term (current) drug therapy: Secondary | ICD-10-CM | POA: Diagnosis not present

## 2017-04-06 DIAGNOSIS — K219 Gastro-esophageal reflux disease without esophagitis: Secondary | ICD-10-CM | POA: Diagnosis not present

## 2017-04-06 DIAGNOSIS — G8929 Other chronic pain: Secondary | ICD-10-CM

## 2017-04-06 DIAGNOSIS — Z79891 Long term (current) use of opiate analgesic: Secondary | ICD-10-CM

## 2017-04-06 DIAGNOSIS — M549 Dorsalgia, unspecified: Secondary | ICD-10-CM | POA: Diagnosis present

## 2017-04-06 DIAGNOSIS — I10 Essential (primary) hypertension: Secondary | ICD-10-CM | POA: Diagnosis not present

## 2017-04-06 DIAGNOSIS — M545 Low back pain: Secondary | ICD-10-CM | POA: Diagnosis not present

## 2017-04-06 DIAGNOSIS — M48061 Spinal stenosis, lumbar region without neurogenic claudication: Secondary | ICD-10-CM | POA: Diagnosis not present

## 2017-04-06 DIAGNOSIS — M4312 Spondylolisthesis, cervical region: Secondary | ICD-10-CM | POA: Diagnosis not present

## 2017-04-06 DIAGNOSIS — G894 Chronic pain syndrome: Secondary | ICD-10-CM | POA: Diagnosis not present

## 2017-04-06 DIAGNOSIS — M5136 Other intervertebral disc degeneration, lumbar region: Secondary | ICD-10-CM | POA: Insufficient documentation

## 2017-04-06 DIAGNOSIS — Z8673 Personal history of transient ischemic attack (TIA), and cerebral infarction without residual deficits: Secondary | ICD-10-CM | POA: Diagnosis not present

## 2017-04-06 DIAGNOSIS — L680 Hirsutism: Secondary | ICD-10-CM | POA: Insufficient documentation

## 2017-04-06 DIAGNOSIS — E781 Pure hyperglyceridemia: Secondary | ICD-10-CM | POA: Insufficient documentation

## 2017-04-06 DIAGNOSIS — F411 Generalized anxiety disorder: Secondary | ICD-10-CM | POA: Diagnosis not present

## 2017-04-06 DIAGNOSIS — M51369 Other intervertebral disc degeneration, lumbar region without mention of lumbar back pain or lower extremity pain: Secondary | ICD-10-CM

## 2017-04-06 DIAGNOSIS — Z7984 Long term (current) use of oral hypoglycemic drugs: Secondary | ICD-10-CM | POA: Insufficient documentation

## 2017-04-06 DIAGNOSIS — F329 Major depressive disorder, single episode, unspecified: Secondary | ICD-10-CM | POA: Diagnosis not present

## 2017-04-06 MED ORDER — METAXALONE 800 MG PO TABS: 800.0000 mg | ORAL_TABLET | Freq: Three times a day (TID) | ORAL | 2 refills | Status: DC | PRN

## 2017-04-06 MED ORDER — HYDROCODONE-ACETAMINOPHEN 5-325 MG PO TABS: 0.5000 | ORAL_TABLET | Freq: Two times a day (BID) | ORAL | 0 refills | Status: DC | PRN

## 2017-04-06 NOTE — Progress Notes (Signed)
Nursing Pain Medication Assessment:  Safety precautions to be maintained throughout the outpatient stay will include: orient to surroundings, keep bed in low position, maintain call bell within reach at all times, provide assistance with transfer out of bed and ambulation.  Medication Inspection Compliance: Pill count conducted under aseptic conditions, in front of the patient. Neither the pills nor the bottle was removed from the patient's sight at any time. Once count was completed pills were immediately returned to the patient in their original bottle.  Medication: Hydrocodone/APAP Pill/Patch Count: 1 of 60 pills remain Pill/Patch Appearance: Markings consistent with prescribed medication Bottle Appearance: Standard pharmacy container. Clearly labeled. Filled Date: 01 / 03/ 2019 Last Medication intake:  Today

## 2017-04-06 NOTE — Progress Notes (Signed)
Patient's Name: Debra Myers  MRN: [id number]  Referring Provider: Lucille Passy, MD  DOB: [birth year]  PCP: Lucille Passy, MD  DOS: 04/06/2017  Note by: Gaspar Cola, MD  Service setting: Ambulatory outpatient  Specialty: Interventional Pain Management  Location: [facility] (AMB) Pain Management Facility    Patient type: Established   Primary Reason(s) for Visit: Encounter for prescription drug management. (Level of risk: moderate)  CC: Back Pain and Shoulder Pain  HPI  Debra Myers is a 52 y.o. year old, female patient, who comes today for a medication management evaluation. She has GOITER, NONTOXIC MULTINODULAR; Diabetes ([place]); Polycystic ovaries; HYPERTRIGLYCERIDEMIA; Metabolic Syndrome X; OBESITY; Anxiety; Allergic rhinitis; Asthma; Hirsutism; Generalized anxiety disorder; Hypertension; Overweight(278.02); GERD (gastroesophageal reflux disease); IUD (intrauterine device) in place; Opiate use (10 MME/Day); Long term prescription opiate use; Long term current use of opiate analgesic; Encounter for therapeutic drug level monitoring; Encounter for chronic pain management; Numbness of upper extremity; Radiculitis involving upper extremity; Chronic low back pain (Location of Primary Source of Pain) (Bilateral) (L>R); Lumbar spondylosis (Bulging Disc & Severe Left Foraminal Stenosis at L3-4); Chronic lower extremity pain (Location of Secondary source of pain) (Left); Lumbar foraminal stenosis (Severe Left L3-4); Lumbar facet syndrome (Location of Primary Source of Pain) (Bilateral) (L>R); Cervical spondylosis (C5-6 & C6-7 DDD); Cervical (3 mm) Grade 1 Anterolisthesis of C4 over C5; Chronic neck pain (Location of Tertiary source of pain) (Bilateral) (L>R); Chronic cervical radicular pain (Bilateral) (L>R); Cervical foraminal stenosis (multilevel) (Bilateral); TIA (transient ischemic attack); Muscle spasm, nocturnal; Musculoskeletal pain; Carpal tunnel syndrome (Bilateral) (L>R); Cervical facet syndrome (Location  of Tertiary source of pain) (Bilateral) (L>R); Chronic pain syndrome; Morbid obesity with BMI of 40.0-44.9, adult ([place]); Well woman exam; DDD (degenerative disc disease), lumbar; and DDD (degenerative disc disease), cervical on their problem list. Her primarily concern today is the Back Pain and Shoulder Pain  Pain Assessment: Location: Lower, Left Back(neck) Radiating: radiates down left leg in the front to knee Onset: More than a month ago Duration: Chronic pain Quality: Burning, Aching Severity: 0-No pain/10 (self-reported pain score)  Note: Reported level is compatible with observation.                               Timing: Constant Modifying factors: rest  Debra Myers was last scheduled for an appointment on Visit date not found for medication management. During today's appointment we reviewed Debra Myers's chronic pain status, as well as her outpatient medication regimen.  The patient  reports that she does not use drugs. Her body mass index is 42.91 kg/m.  Further details on both, my assessment(s), as well as the proposed treatment plan, please see below.  Controlled Substance Pharmacotherapy Assessment REMS (Risk Evaluation and Mitigation Strategy)  Analgesic: Hydrocodone/APAP 5/325 2 tablets per day (10 mg/day) MME/day:10 mg/day   Debra Shorter, Debra Myers  04/06/2017 10:25 AM  Signed Nursing Pain Medication Assessment:  Safety precautions to be maintained throughout the outpatient stay will include: orient to surroundings, keep bed in low position, maintain call bell within reach at all times, provide assistance with transfer out of bed and ambulation.  Medication Inspection Compliance: Pill count conducted under aseptic conditions, in front of the patient. Neither the pills nor the bottle was removed from the patient's sight at any time. Once count was completed pills were immediately returned to the patient in their original bottle.  Medication: Hydrocodone/APAP Pill/Patch Count: 1 of 60  pills remain Pill/Patch Appearance: Markings consistent with prescribed medication Bottle Appearance: Standard pharmacy container. Clearly labeled. Filled Date: 01 / 03/ 2019 Last Medication intake:  Today   Pharmacokinetics: Liberation and absorption (onset of action): WNL Distribution (time to peak effect): WNL Metabolism and excretion (duration of action): WNL         Pharmacodynamics: Desired effects: Analgesia: Debra Myers reports >50% benefit. Functional ability: Patient reports that medication allows her to accomplish basic ADLs Clinically meaningful improvement in function (CMIF): Sustained CMIF goals met Perceived effectiveness: Described as relatively effective, allowing for increase in activities of daily living (ADL) Undesirable effects: Side-effects or Adverse reactions: None reported Monitoring: [place] PMP: Online review of the past 83-monthperiod conducted. Compliant with practice rules and regulations Last UDS on record: Summary  Date Value Ref Range Status  11/29/2016 FINAL  Final    Comment:    ==================================================================== TOXASSURE SELECT 13 (MW) ==================================================================== Test                             Result       Flag       Units Drug Present and Declared for Prescription Verification   Amphetamine                    2707         EXPECTED   ng/mg creat    Amphetamine is available as a schedule II prescription drug.   Lorazepam                      495          EXPECTED   ng/mg creat    Source of lorazepam is a scheduled prescription medication.   Hydrocodone                    562          EXPECTED   ng/mg creat   Dihydrocodeine                 43           EXPECTED   ng/mg creat   Norhydrocodone                 325          EXPECTED   ng/mg creat    Sources of hydrocodone include scheduled prescription    medications. Dihydrocodeine and norhydrocodone are expected     metabolites of hydrocodone. Dihydrocodeine is also available as a    scheduled prescription medication. ==================================================================== Test                      Result    Flag   Units      Ref Range   Creatinine              177              mg/dL      >=20 ==================================================================== Declared Medications:  The flagging and interpretation on this report are based on the  following declared medications.  Unexpected results may arise from  inaccuracies in the declared medications.  **Note: The testing scope of this panel includes these medications:  Amphetamine (Vyvanse)  Hydrocodone (Norco)  Lorazepam (Ativan)  **Note: The testing scope of this panel does not include following  reported medications:  Acetaminophen (Norco)  Aspirin (Aspirin 81)  Atorvastatin (  Lipitor)  Bupropion (Wellbutrin)  Cetirizine (Zyrtec)  Fluticasone (Flonase)  Lamotrigine (Lamictal)  Metaxalone (Skelaxin)  Metformin  Montelukast (Singulair)  Pantoprazole (Protonix)  Paroxetine (Paxil)  Spironolactone (Aldactone) ==================================================================== For clinical consultation, please call [phone]. ====================================================================    UDS interpretation: Compliant          Medication Assessment Form: Reviewed. Patient indicates being compliant with therapy Treatment compliance: Compliant Risk Assessment Profile: Aberrant behavior: See prior evaluations. None observed or detected today Comorbid factors increasing risk of overdose: See prior notes. No additional risks detected today Risk of substance use disorder (SUD): Low  ORT Scoring interpretation table:  Score <3 = Low Risk for SUD  Score between 4-7 = Moderate Risk for SUD  Score >8 = High Risk for Opioid Abuse   Risk Mitigation Strategies:  Patient Counseling: Covered Patient-Prescriber  Agreement (PPA): Present and active  Notification to other healthcare providers: Done  Pharmacologic Plan: No change in therapy, at this time.             Laboratory Chemistry  Rheumatology Markers Lab Results  Component Value Date   LABURIC 4.0 10/31/2015                Renal Function Markers Lab Results  Component Value Date   BUN 13 12/10/2016   CREATININE 0.85 12/10/2016   GFRAA >60 04/04/2016   GFRNONAA >60 04/04/2016                 Hepatic Function Markers Lab Results  Component Value Date   AST 13 12/10/2016   ALT 12 12/10/2016   ALBUMIN 4.4 10/31/2015   ALKPHOS 125 (H) 10/31/2015   HCVAB NEGATIVE 05/18/2013                 Electrolytes Lab Results  Component Value Date   NA 139 12/10/2016   K 4.2 12/10/2016   CL 106 12/10/2016   CALCIUM 8.7 12/10/2016   PHOS 4.0 10/31/2015                        Neuropathy Markers Lab Results  Component Value Date   HGBA1C 5.9 06/12/2015   HIV NON REACTIVE 05/18/2013                 Bone Pathology Markers Lab Results  Component Value Date   VD25OH 44 05/18/2013                         Coagulation Parameters Lab Results  Component Value Date   INR 1.10 06/11/2015   LABPROT 14.4 06/11/2015   APTT 33 06/11/2015   PLT 279 12/10/2016                 Cardiovascular Markers Lab Results  Component Value Date   TROPONINI <0.03 06/11/2015   HGB 11.6 (L) 12/10/2016   HCT 34.9 (L) 12/10/2016                 CA Markers Lab Results  Component Value Date   CA125 14 07/11/2015                 Note: Lab results reviewed.  Recent Diagnostic Imaging Results  US Transvaginal Non-OB Patient is asymptomatic ultrasound today demonstrated following: Uterus measured 8.2 x 4.3 0.6 cm endometrial stripe 4.1 mm. IUD was seen  in the normal position. Right ovary continued presence of a solid  echogenic focus 13 x 12 mm negative  color flow. Left ovary thinwall  echo-free cyst measuring 32 x 34 x 29 mm oversize 3.2 cm  negative color  flow. Since had some slight low level echoes 13 x 10 mm. Negative color  flow Doppler no fluid in the cul-de-sac.  Complexity Note: Imaging results reviewed. Results shared with Debra Myers, using Layman's terms.                         Meds   Current Outpatient Medications:  .  atorvastatin (LIPITOR) 10 MG tablet, take 1 tablet by mouth once daily, Disp: 30 tablet, Rfl: 0 .  buPROPion (WELLBUTRIN XL) 300 MG 24 hr tablet, Take 300 mg by mouth daily., Disp: , Rfl:  .  cetirizine (ZYRTEC) 10 MG tablet, Take 10 mg by mouth at bedtime., Disp: , Rfl:  .  fluticasone (FLONASE) 50 MCG/ACT nasal spray, Place 2 sprays into both nostrils daily as needed for rhinitis., Disp: 16 g, Rfl: 1 .  [START ON 06/05/2017] HYDROcodone-acetaminophen (NORCO/VICODIN) 5-325 MG tablet, Take 0.5-1 tablets by mouth 2 (two) times daily as needed for severe pain., Disp: 60 tablet, Rfl: 0 .  [START ON 05/06/2017] HYDROcodone-acetaminophen (NORCO/VICODIN) 5-325 MG tablet, Take 0.5-1 tablets by mouth 2 (two) times daily as needed for severe pain., Disp: 60 tablet, Rfl: 0 .  HYDROcodone-acetaminophen (NORCO/VICODIN) 5-325 MG tablet, Take 0.5-1 tablets by mouth 2 (two) times daily as needed for severe pain., Disp: 60 tablet, Rfl: 0 .  lamoTRIgine (LAMICTAL) 200 MG tablet, Take 300 mg by mouth at bedtime. , Disp: , Rfl:  .  lisdexamfetamine (VYVANSE) 20 MG capsule, Take 20 mg by mouth daily., Disp: , Rfl:  .  LORazepam (ATIVAN) 1 MG tablet, Take 0.5-1 mg by mouth every 8 (eight) hours as needed for anxiety or sleep. , Disp: , Rfl:  .  metaxalone (SKELAXIN) 800 MG tablet, Take 1 tablet (800 mg total) by mouth 3 (three) times daily as needed for muscle spasms., Disp: 90 tablet, Rfl: 2 .  metFORMIN (GLUCOPHAGE) 500 MG tablet, take 1 tablet by mouth once daily WITH BREAKFAST, Disp: 90 tablet, Rfl: 0 .  montelukast (SINGULAIR) 10 MG tablet, Take 1 tablet (10 mg total) by mouth at bedtime., Disp: 90 tablet, Rfl: 3 .   pantoprazole (PROTONIX) 40 MG tablet, take 1 tablet by mouth once daily, Disp: 90 tablet, Rfl: 3 .  PARoxetine (PAXIL-CR) 25 MG 24 hr tablet, Take 50 mg by mouth at bedtime. , Disp: , Rfl:  .  RA ASPIRIN EC ADULT LOW ST 81 MG EC tablet, take 1 tablet by mouth once daily, Disp: 30 tablet, Rfl: 0 .  spironolactone (ALDACTONE) 25 MG tablet, TAKE 1 TABLET BY MOUTH ONCE DAILY, Disp: 90 tablet, Rfl: 0  ROS  Constitutional: Denies any fever or chills Gastrointestinal: No reported hemesis, hematochezia, vomiting, or acute GI distress Musculoskeletal: Denies any acute onset joint swelling, redness, loss of ROM, or weakness Neurological: No reported episodes of acute onset apraxia, aphasia, dysarthria, agnosia, amnesia, paralysis, loss of coordination, or loss of consciousness  Allergies  Ms. Macaluso is allergic to oxycodone; penicillins; and sulfa antibiotics.  Debra Myers  Debra Myers  reports that she does not use drugs. Alcohol:  reports that she drinks alcohol. Tobacco:  reports that  has never smoked. she has never used smokeless tobacco. Medical:  has a past medical history of Anxiety, Asthma, Bulging lumbar disc (L3-4) (02/26/2015), Degenerative disc disease, Degenerative lumbar disc, Depression, Diabetes mellitus, Elective abortion, GERD (  gastroesophageal reflux disease), Hypertension, Stroke ([place]) (06/11/2015), and Vaginal delivery. Surgical: Debra Myers  has a past surgical history that includes Cholecystectomy (2001); Lapband (04/2012); Wisdom tooth extraction; Dilatation & curettage/hysteroscopy with myosure (N/A, 09/10/2014); and mirena. Family: family history includes Asthma in her maternal grandmother; Diabetes in her father; Hypertension in her father and mother; Stroke in her maternal grandfather and maternal grandmother.  Constitutional Exam  General appearance: Well nourished, well developed, and well hydrated. In no apparent acute distress Vitals:   04/06/17 1009  BP: 129/76  Pulse: (!)  103  Resp: 16  Temp: 98.3 F (36.8 C)  SpO2: 99%  Weight: 250 lb (113.4 kg)  Height: '5\' 4"'  (1.626 m)   BMI Assessment: Estimated body mass index is 42.91 kg/m as calculated from the following:   Height as of this encounter: '5\' 4"'  (1.626 m).   Weight as of this encounter: 250 lb (113.4 kg).  BMI interpretation table: BMI level Category Range association with higher incidence of chronic pain  <18 kg/m2 Underweight   18.5-24.9 kg/m2 Ideal body weight   25-29.9 kg/m2 Overweight Increased incidence by 20%  30-34.9 kg/m2 Obese (Class I) Increased incidence by 68%  35-39.9 kg/m2 Severe obesity (Class II) Increased incidence by 136%  >40 kg/m2 Extreme obesity (Class III) Increased incidence by 254%   BMI Readings from Last 4 Encounters:  04/06/17 42.91 kg/m  12/31/16 42.57 kg/m  12/10/16 43.26 kg/m  11/29/16 42.91 kg/m   Wt Readings from Last 4 Encounters:  04/06/17 250 lb (113.4 kg)  12/31/16 248 lb (112.5 kg)  12/10/16 252 lb (114.3 kg)  11/29/16 250 lb (113.4 kg)  Psych/Mental status: Alert, oriented x 3 (person, place, & time)       Eyes: PERLA Respiratory: No evidence of acute respiratory distress  Cervical Spine Area Exam  Skin & Axial Inspection: No masses, redness, edema, swelling, or associated skin lesions Alignment: Symmetrical Functional ROM: Decreased ROM      Stability: No instability detected Muscle Tone/Strength: Functionally intact. No obvious neuro-muscular anomalies detected. Sensory (Neurological): Movement-associated discomfort Palpation: Complains of area being tender to palpation              Upper Extremity (UE) Exam    Side: Right upper extremity  Side: Left upper extremity  Skin & Extremity Inspection: Skin color, temperature, and hair growth are WNL. No peripheral edema or cyanosis. No masses, redness, swelling, asymmetry, or associated skin lesions. No contractures.  Skin & Extremity Inspection: Skin color, temperature, and hair growth are WNL.  No peripheral edema or cyanosis. No masses, redness, swelling, asymmetry, or associated skin lesions. No contractures.  Functional ROM: Unrestricted ROM          Functional ROM: Unrestricted ROM          Muscle Tone/Strength: Functionally intact. No obvious neuro-muscular anomalies detected.  Muscle Tone/Strength: Functionally intact. No obvious neuro-muscular anomalies detected.  Sensory (Neurological): Unimpaired          Sensory (Neurological): Unimpaired          Palpation: No palpable anomalies              Palpation: No palpable anomalies              Specialized Test(s): Deferred         Specialized Test(s): Deferred          Thoracic Spine Area Exam  Skin & Axial Inspection: No masses, redness, or swelling Alignment: Symmetrical Functional ROM: Unrestricted ROM Stability: No instability  detected Muscle Tone/Strength: Functionally intact. No obvious neuro-muscular anomalies detected. Sensory (Neurological): Unimpaired Muscle strength & Tone: No palpable anomalies  Lumbar Spine Area Exam  Skin & Axial Inspection: No masses, redness, or swelling Alignment: Symmetrical Functional ROM: Decreased ROM      Stability: No instability detected Muscle Tone/Strength: Functionally intact. No obvious neuro-muscular anomalies detected. Sensory (Neurological): Movement-associated discomfort Palpation: Complains of area being tender to palpation       Provocative Tests: Lumbar Hyperextension and rotation test: Positive bilaterally for facet joint pain. Lumbar Lateral bending test: evaluation deferred today       Patrick's Maneuver: evaluation deferred today                    Gait & Posture Assessment  Ambulation: Limited Gait: Antalgic Posture: WNL   Lower Extremity Exam    Side: Right lower extremity  Side: Left lower extremity  Skin & Extremity Inspection: Skin color, temperature, and hair growth are WNL. No peripheral edema or cyanosis. No masses, redness, swelling, asymmetry, or  associated skin lesions. No contractures.  Skin & Extremity Inspection: Skin color, temperature, and hair growth are WNL. No peripheral edema or cyanosis. No masses, redness, swelling, asymmetry, or associated skin lesions. No contractures.  Functional ROM: Unrestricted ROM          Functional ROM: Unrestricted ROM          Muscle Tone/Strength: Functionally intact. No obvious neuro-muscular anomalies detected.  Muscle Tone/Strength: Functionally intact. No obvious neuro-muscular anomalies detected.  Sensory (Neurological): Unimpaired  Sensory (Neurological): Unimpaired  Palpation: No palpable anomalies  Palpation: No palpable anomalies   Assessment  Primary Diagnosis & Pertinent Problem List: The primary encounter diagnosis was Cervical facet syndrome (Location of Tertiary source of pain) (Bilateral) (L>R). Diagnoses of Chronic low back pain (Location of Primary Source of Pain) (Bilateral) (L>R), Chronic lower extremity pain (Location of Secondary source of pain) (Left), Chronic neck pain (Location of Tertiary source of pain) (Bilateral) (L>R), Long term prescription opiate use, Opiate use (10 MME/Day), Chronic pain syndrome, Muscle spasm, nocturnal, Musculoskeletal pain, DDD (degenerative disc disease), lumbar, DDD (degenerative disc disease), cervical, and Lumbar foraminal stenosis (Severe Left L3-4) were also pertinent to this visit.  Status Diagnosis  Controlled Controlled Controlled 1. Cervical facet syndrome (Location of Tertiary source of pain) (Bilateral) (L>R)   2. Chronic low back pain (Location of Primary Source of Pain) (Bilateral) (L>R)   3. Chronic lower extremity pain (Location of Secondary source of pain) (Left)   4. Chronic neck pain (Location of Tertiary source of pain) (Bilateral) (L>R)   5. Long term prescription opiate use   6. Opiate use (10 MME/Day)   7. Chronic pain syndrome   8. Muscle spasm, nocturnal   9. Musculoskeletal pain   10. DDD (degenerative disc disease),  lumbar   11. DDD (degenerative disc disease), cervical   12. Lumbar foraminal stenosis (Severe Left L3-4)     Problems updated and reviewed during this visit: Problem  Ddd (Degenerative Disc Disease), Lumbar  Ddd (Degenerative Disc Disease), Cervical  Lumbar foraminal stenosis (Severe Left L3-4)   Severe neuroforaminal narrowing on the left at L3-4 and L5-S1. At these levels there are findings concerning for exiting nerve  root  compromise and compression. Areas of moderate neuroforaminal narrowing are also identified at L5-S1 on the right and L3-4 and on the left at L4-5.    Plan of Care  Pharmacotherapy (Medications Ordered): Meds ordered this encounter  Medications  . HYDROcodone-acetaminophen (NORCO/VICODIN)  5-325 MG tablet    Sig: Take 0.5-1 tablets by mouth 2 (two) times daily as needed for severe pain.    Dispense:  60 tablet    Refill:  0    Do not add this medication to the electronic "Automatic Refill" notification system. Patient may have prescription filled one day early if pharmacy is closed on scheduled refill date. Do not fill until: 06/05/17 To last until: 07/05/17  . HYDROcodone-acetaminophen (NORCO/VICODIN) 5-325 MG tablet    Sig: Take 0.5-1 tablets by mouth 2 (two) times daily as needed for severe pain.    Dispense:  60 tablet    Refill:  0    Do not add this medication to the electronic "Automatic Refill" notification system. Patient may have prescription filled one day early if pharmacy is closed on scheduled refill date. Do not fill until: 05/06/17 To last until:  06/05/17  . HYDROcodone-acetaminophen (NORCO/VICODIN) 5-325 MG tablet    Sig: Take 0.5-1 tablets by mouth 2 (two) times daily as needed for severe pain.    Dispense:  60 tablet    Refill:  0    Do not add this medication to the electronic "Automatic Refill" notification system. Patient may have prescription filled one day early if pharmacy is closed on scheduled refill date. Do not fill until:  04/06/17 To last until: 05/06/17  . metaxalone (SKELAXIN) 800 MG tablet    Sig: Take 1 tablet (800 mg total) by mouth 3 (three) times daily as needed for muscle spasms.    Dispense:  90 tablet    Refill:  2    Do not add this medication to the electronic "Automatic Refill" notification system. Patient may have prescription filled one day early if pharmacy is closed on scheduled refill date.   Medications administered today: Tamala Julian had no medications administered during this visit.  Procedure Orders    No procedure(s) ordered today   Lab Orders  No laboratory test(s) ordered today   Imaging Orders  No imaging studies ordered today   Referral Orders  No referral(s) requested today    Interventional management options: Planned, scheduled, and/or pending:   None at this time secondary to the patient's body habitus (BMI=42). She needs to bring it down to 30. We may consider interventions once she is back into the 30's. If the patient's lower extremity symptoms worsen, we will consider repeating her lumbar MRI since the last one on record is from 2013.   Considering:   Diagnostic bilateral lumbar facet block  Possible bilateral lumbar facet RFA.  Diagnostic left L3-4 interlaminar LESI  Diagnostic left-sided L3-4 transforaminal LESI  Diagnostic left-sided L4-5 transforaminal LESI  Diagnostic bilateral L5-S1 transforaminal LESI  Diagnostic left-sided cervical epidural steroid injection  Diagnostic bilateral cervical facet block  Possible bilateral cervical facet RFA.    Palliative PRN treatment(s):   None at this time   Provider-requested follow-up: Return in about 3 months (around 07/04/2017) for Med-Mgmt, w/ Dionisio David, NP.  Future Appointments  Date Time Provider [place]  07/04/2017 11:00 AM Vevelyn Francois, NP [facility] None  01/06/2018  2:00 PM Fontaine, Belinda Block, MD [facility] Bonito   Primary Care Physician: Lucille Passy, MD Location: [facility]  Pain Management Facility Note by: Gaspar Cola, MD Date: 04/06/2017; Time: 11:16 AM

## 2017-04-06 NOTE — Patient Instructions (Signed)
You have received scripts for hydrocodone x 3 and skelaxin today.

## 2017-04-11 DIAGNOSIS — M5030 Other cervical disc degeneration, unspecified cervical region: Secondary | ICD-10-CM | POA: Insufficient documentation

## 2017-04-11 DIAGNOSIS — M51369 Other intervertebral disc degeneration, lumbar region without mention of lumbar back pain or lower extremity pain: Secondary | ICD-10-CM | POA: Insufficient documentation

## 2017-04-11 DIAGNOSIS — M5136 Other intervertebral disc degeneration, lumbar region: Secondary | ICD-10-CM | POA: Insufficient documentation

## 2017-04-19 ENCOUNTER — Other Ambulatory Visit: Payer: Self-pay | Admitting: Family Medicine

## 2017-04-20 ENCOUNTER — Emergency Department: Payer: Managed Care, Other (non HMO)

## 2017-04-20 ENCOUNTER — Emergency Department
Admission: EM | Admit: 2017-04-20 | Discharge: 2017-04-20 | Disposition: A | Discharge status: Home / Self Care | Payer: Managed Care, Other (non HMO) | Attending: Emergency Medicine | Admitting: Emergency Medicine

## 2017-04-20 ENCOUNTER — Encounter: Payer: Self-pay | Admitting: Emergency Medicine

## 2017-04-20 DIAGNOSIS — S60512A Abrasion of left hand, initial encounter: Secondary | ICD-10-CM | POA: Insufficient documentation

## 2017-04-20 DIAGNOSIS — Z79899 Other long term (current) drug therapy: Secondary | ICD-10-CM | POA: Diagnosis not present

## 2017-04-20 DIAGNOSIS — Z8673 Personal history of transient ischemic attack (TIA), and cerebral infarction without residual deficits: Secondary | ICD-10-CM | POA: Insufficient documentation

## 2017-04-20 DIAGNOSIS — Z7984 Long term (current) use of oral hypoglycemic drugs: Secondary | ICD-10-CM | POA: Insufficient documentation

## 2017-04-20 DIAGNOSIS — I10 Essential (primary) hypertension: Secondary | ICD-10-CM | POA: Diagnosis not present

## 2017-04-20 DIAGNOSIS — Y998 Other external cause status: Secondary | ICD-10-CM | POA: Insufficient documentation

## 2017-04-20 DIAGNOSIS — M7918 Myalgia, other site: Secondary | ICD-10-CM

## 2017-04-20 DIAGNOSIS — Y92410 Unspecified street and highway as the place of occurrence of the external cause: Secondary | ICD-10-CM | POA: Insufficient documentation

## 2017-04-20 DIAGNOSIS — E119 Type 2 diabetes mellitus without complications: Secondary | ICD-10-CM | POA: Diagnosis not present

## 2017-04-20 DIAGNOSIS — Y939 Activity, unspecified: Secondary | ICD-10-CM | POA: Insufficient documentation

## 2017-04-20 DIAGNOSIS — S6992XA Unspecified injury of left wrist, hand and finger(s), initial encounter: Secondary | ICD-10-CM | POA: Diagnosis present

## 2017-04-20 DIAGNOSIS — J45909 Unspecified asthma, uncomplicated: Secondary | ICD-10-CM | POA: Diagnosis not present

## 2017-04-20 MED ORDER — TRAMADOL HCL 50 MG PO TABS: 50.0000 mg | ORAL_TABLET | Freq: Four times a day (QID) | ORAL | 0 refills | Status: DC | PRN

## 2017-04-20 MED ORDER — IBUPROFEN 800 MG PO TABS
800.0000 mg | ORAL_TABLET | Freq: Three times a day (TID) | ORAL | 0 refills | Status: DC | PRN
Start: 2017-04-20 — End: 2017-04-29

## 2017-04-20 MED ORDER — TRAMADOL HCL 50 MG PO TABS
50.0000 mg | ORAL_TABLET | Freq: Once | ORAL | Status: AC
  Administered 2017-04-20: 50 mg via ORAL
  Filled 2017-04-20: qty 1

## 2017-04-20 MED ORDER — IBUPROFEN 800 MG PO TABS
800.0000 mg | ORAL_TABLET | Freq: Once | ORAL | Status: AC
  Administered 2017-04-20: 800 mg via ORAL
  Filled 2017-04-20: qty 1

## 2017-04-20 MED ORDER — CYCLOBENZAPRINE HCL 10 MG PO TABS
10.0000 mg | ORAL_TABLET | Freq: Once | ORAL | Status: AC
  Administered 2017-04-20: 10 mg via ORAL
  Filled 2017-04-20: qty 1

## 2017-04-20 MED ORDER — BACITRACIN ZINC 500 UNIT/GM EX OINT
TOPICAL_OINTMENT | Freq: Once | CUTANEOUS | Status: AC
  Administered 2017-04-20: 1 via TOPICAL
  Filled 2017-04-20: qty 0.9
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
## 2017-04-20 NOTE — Discharge Instructions (Signed)
Advised over-the-counter Neosporin or bacitracin for abrasion.

## 2017-04-29 ENCOUNTER — Ambulatory Visit: Payer: Managed Care, Other (non HMO) | Admitting: Family Medicine

## 2017-04-29 ENCOUNTER — Encounter: Payer: Self-pay | Admitting: Family Medicine

## 2017-04-29 VITALS — BP 122/80 | HR 77 | Temp 98.0°F | Wt 252.0 lb

## 2017-04-29 DIAGNOSIS — M79601 Pain in right arm: Secondary | ICD-10-CM

## 2017-04-29 DIAGNOSIS — M79642 Pain in left hand: Secondary | ICD-10-CM

## 2017-04-29 MED ORDER — FLUTICASONE PROPIONATE 50 MCG/ACT NA SUSP: 2.0000 | Freq: Every day | NASAL | 1 refills | Status: DC | PRN

## 2017-04-29 MED ORDER — PANTOPRAZOLE SODIUM 40 MG PO TBEC: 40.0000 mg | DELAYED_RELEASE_TABLET | Freq: Every day | ORAL | 0 refills | Status: DC

## 2017-04-29 MED ORDER — IBUPROFEN 800 MG PO TABS: 800.0000 mg | ORAL_TABLET | Freq: Three times a day (TID) | ORAL | 0 refills | Status: DC | PRN
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
## 2017-04-29 NOTE — Patient Instructions (Signed)
Good to see you today  I have sent in additional ibuprofen, take 1-2 times a day as needed  Ice and wear your splint as needed  If not better in 2 weeks, please let me know and I can put in a referral to hand specialist.

## 2017-05-08 ENCOUNTER — Other Ambulatory Visit: Payer: Self-pay | Admitting: Neurology

## 2017-05-16 ENCOUNTER — Other Ambulatory Visit: Payer: Self-pay | Admitting: Neurology

## 2017-05-26 ENCOUNTER — Encounter: Payer: Self-pay | Admitting: Family Medicine

## 2017-05-26 ENCOUNTER — Ambulatory Visit: Payer: Self-pay | Admitting: Family Medicine

## 2017-05-26 VITALS — BP 130/65 | HR 99 | Temp 98.3°F | Resp 20 | Wt 254.0 lb

## 2017-05-26 DIAGNOSIS — R5383 Other fatigue: Secondary | ICD-10-CM
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
## 2017-05-27 LAB — COMPREHENSIVE METABOLIC PANEL
ALBUMIN: 4.1 g/dL (ref 3.5–5.5)
ALT: 14 IU/L (ref 0–32)
AST: 16 IU/L (ref 0–40)
Albumin/Globulin Ratio: 1.5 (ref 1.2–2.2)
Alkaline Phosphatase: 104 IU/L (ref 39–117)
BUN / CREAT RATIO: 15 (ref 9–23)
BUN: 12 mg/dL (ref 6–24)
Bilirubin Total: 0.2 mg/dL (ref 0.0–1.2)
CALCIUM: 8.5 mg/dL — AB (ref 8.7–10.2)
CO2: 22 mmol/L (ref 20–29)
CREATININE: 0.81 mg/dL (ref 0.57–1.00)
Chloride: 105 mmol/L (ref 96–106)
GFR, EST AFRICAN AMERICAN: 98 mL/min/{1.73_m2} (ref >59)
GFR, EST NON AFRICAN AMERICAN: 85 mL/min/{1.73_m2} (ref >59)
GLOBULIN, TOTAL: 2.7 g/dL (ref 1.5–4.5)
Glucose: 116 mg/dL — ABNORMAL HIGH (ref 65–99)
Potassium: 4.6 mmol/L (ref 3.5–5.2)
Sodium: 139 mmol/L (ref 134–144)
TOTAL PROTEIN: 6.8 g/dL (ref 6.0–8.5)

## 2017-05-27 LAB — CBC WITH DIFFERENTIAL/PLATELET
BASOS: 1 %
Basophils Absolute: 0.1 10*3/uL (ref 0.0–0.2)
EOS (ABSOLUTE): 0.2 10*3/uL (ref 0.0–0.4)
EOS: 3 %
HEMATOCRIT: 34.4 % (ref 34.0–46.6)
Hemoglobin: 11 g/dL — ABNORMAL LOW (ref 11.1–15.9)
IMMATURE GRANULOCYTES: 0 %
Immature Grans (Abs): 0 10*3/uL (ref 0.0–0.1)
Lymphocytes Absolute: 1.7 10*3/uL (ref 0.7–3.1)
Lymphs: 24 %
MCH: 26.1 pg — AB (ref 26.6–33.0)
MCHC: 32 g/dL (ref 31.5–35.7)
MCV: 82 fL (ref 79–97)
MONOS ABS: 0.3 10*3/uL (ref 0.1–0.9)
Monocytes: 4 %
NEUTROS ABS: 5 10*3/uL (ref 1.4–7.0)
NEUTROS PCT: 68 %
Platelets: 224 10*3/uL (ref 150–379)
RBC: 4.22 x10E6/uL (ref 3.77–5.28)
RDW: 15.9 % — AB (ref 12.3–15.4)
WBC: 7.3 10*3/uL (ref 3.4–10.8)

## 2017-05-27 LAB — TRANSFERRIN: Transferrin: 366 mg/dL (ref 200–370)

## 2017-05-27 LAB — TSH: TSH: 0.64 u[IU]/mL (ref 0.450–4.500)

## 2017-05-27 LAB — IRON AND TIBC
Iron Saturation: 7 % — CL (ref 15–55)
Iron: 29 ug/dL (ref 27–159)
TIBC: 406 ug/dL (ref 250–450)
UIBC: 377 ug/dL (ref 131–425)

## 2017-05-27 LAB — VITAMIN D 25 HYDROXY (VIT D DEFICIENCY, FRACTURES): VIT D 25 HYDROXY: 20.9 ng/mL — AB (ref 30.0–100.0)

## 2017-05-27 LAB — HIGH SENSITIVITY CRP: CRP, High Sensitivity: 5.88 mg/L — ABNORMAL HIGH (ref 0.00–3.00)

## 2017-05-27 LAB — FERRITIN: Ferritin: 22 ng/mL (ref 15–150)

## 2017-05-27 LAB — SEDIMENTATION RATE: Sed Rate: 32 mm/hr (ref 0–40)

## 2017-05-27 NOTE — Progress Notes (Signed)
Asher MuirJamie (RN) called and spoke with the patient and informed her of her lab results.  Advised patient to come in at the beginning of next week to repeat her iron studies first thing in the morning and to fast o rule out the possibility of diurnal variation as a cause of the abnormal values.  Patient verbalized understanding of results and this plan and plans to come in first thing Tuesday morning for a lab visit. Asher MuirJamie advised the patient that I would like her to return to the clinic after those results have come back to discuss these.  Patient verbalized understanding.  Consulted Dr. Sullivan LoneGilbert regarding this plan, who is in agreement.

## 2017-05-28 IMAGING — CT CT HEAD W/O CM
1 series · 16 of 30 positions shown, 20 images · non-contrast
Comparison: None.

CLINICAL DATA: Code stroke. Tongue and lower lip numbness for 3
hours.

EXAM:
CT HEAD WITHOUT CONTRAST
TECHNIQUE: Contiguous axial images were obtained from the base of the skull
through the vertex without intravenous contrast.

[Series 2: head wo · axial · 0.40mm/px · z∈[+644,+784]mm · 16 of 32 slices shown, 20 images]
[im 2/32  brain]
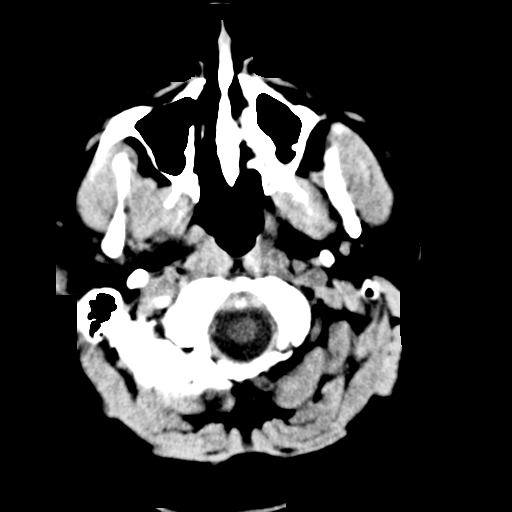
[im 2/32  bone]
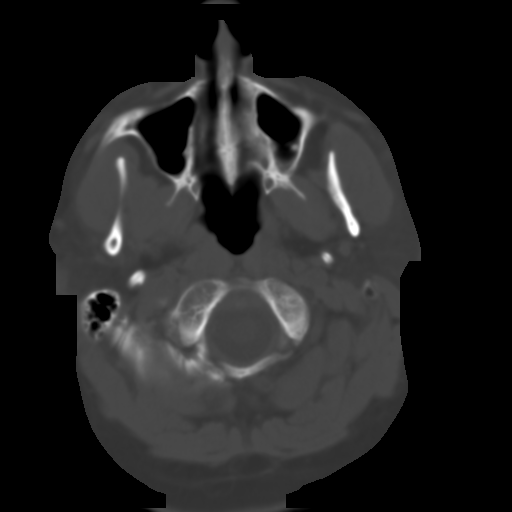
[im 4/32  brain]
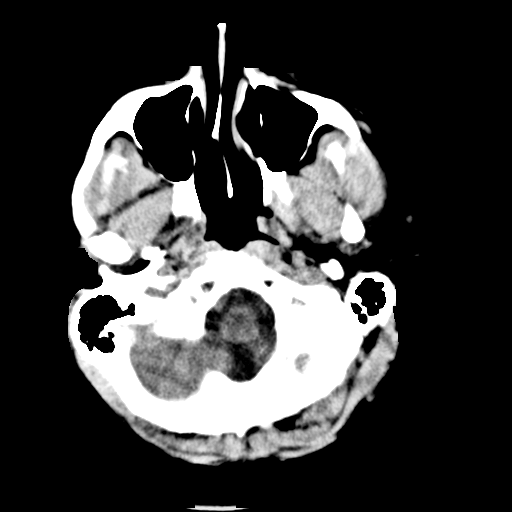
[im 6/32  brain]
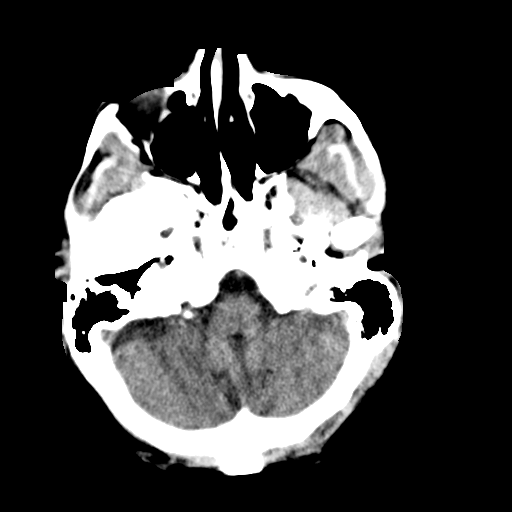
[im 8/32  brain]
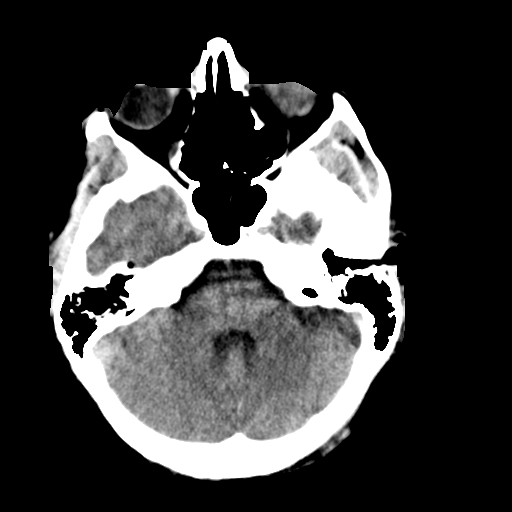
[im 9/32  brain]
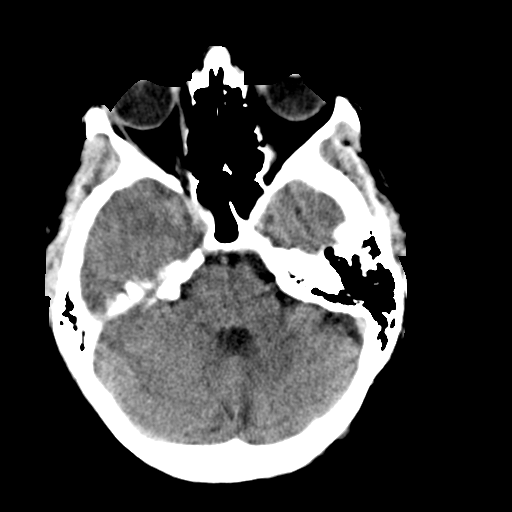
[im 9/32  bone]
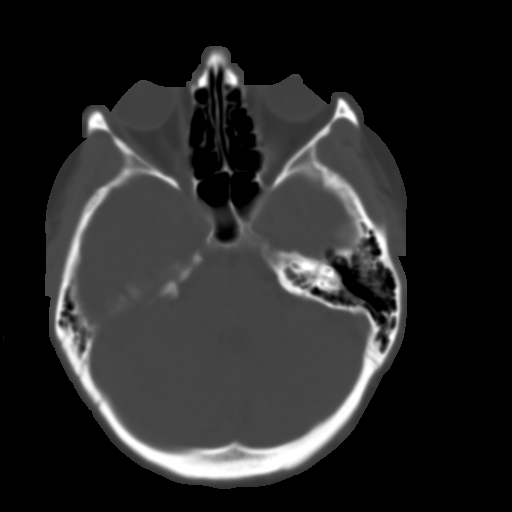
[im 11/32  brain]
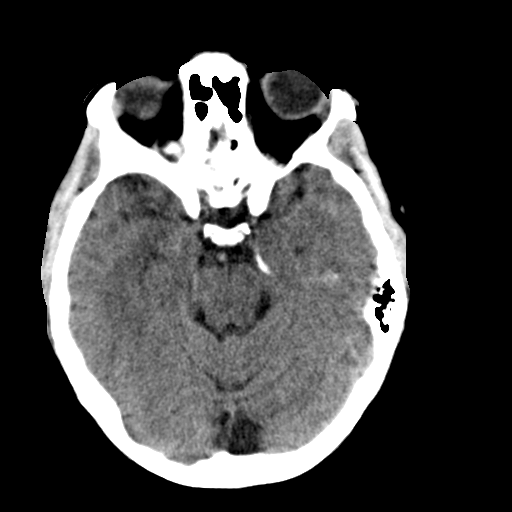
[im 13/32  brain]
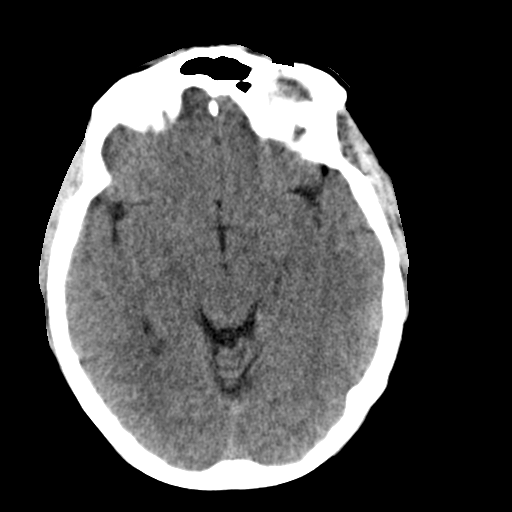
[im 15/32  brain]
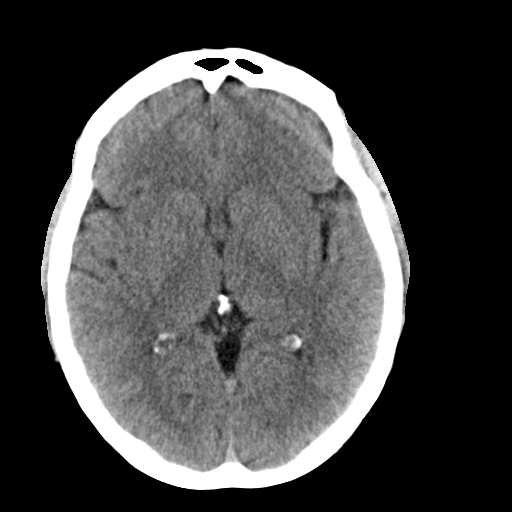
[im 17/32  brain]
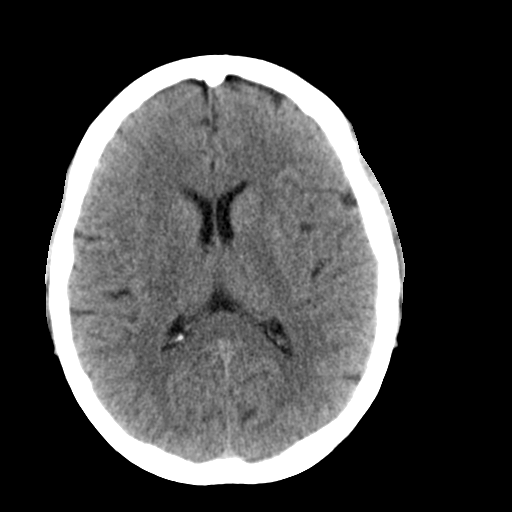
[im 17/32  bone]
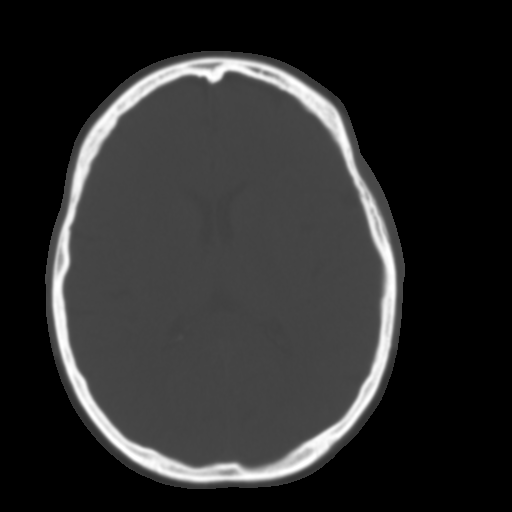
[im 19/32  brain]
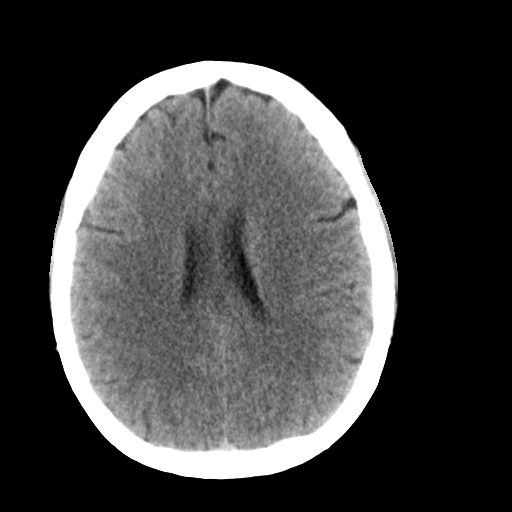
[im 21/32  brain]
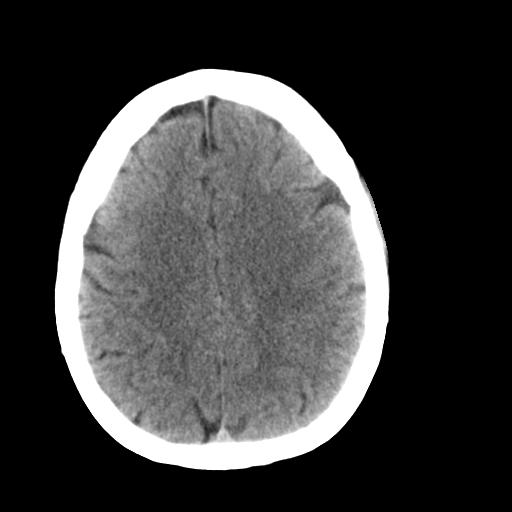
[im 23/32  brain]
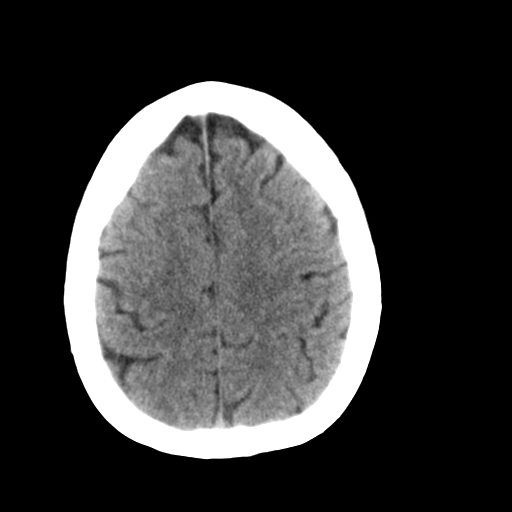
[im 24/32  brain]
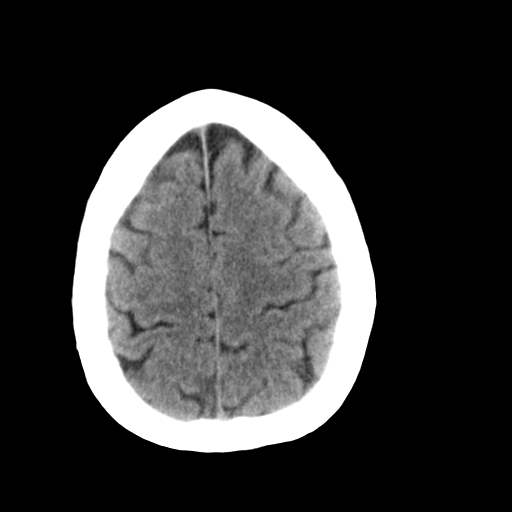
[im 24/32  bone]
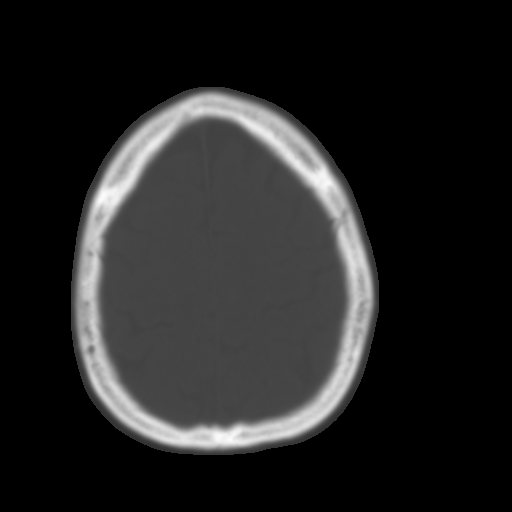
[im 26/32  brain]
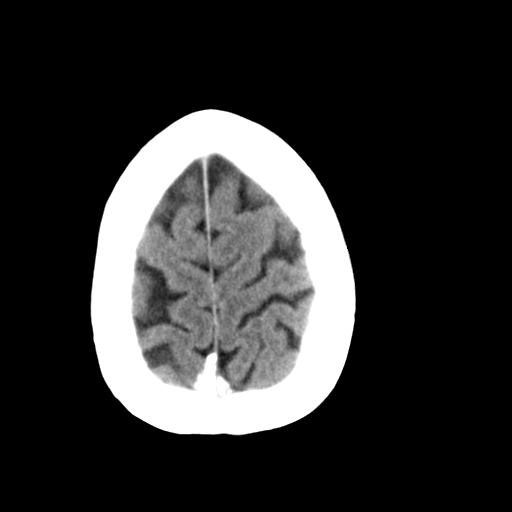
[im 28/32  brain]
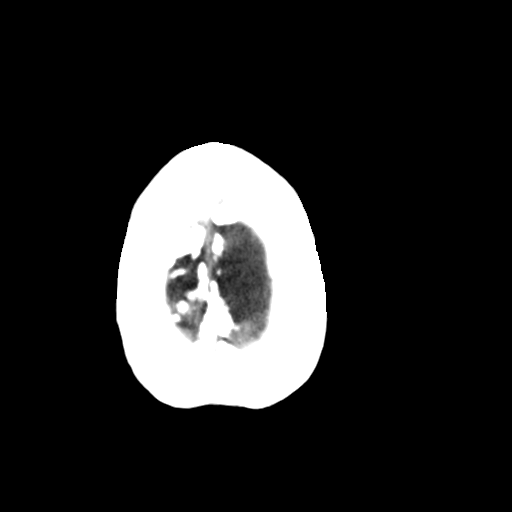
[im 30/32  brain]
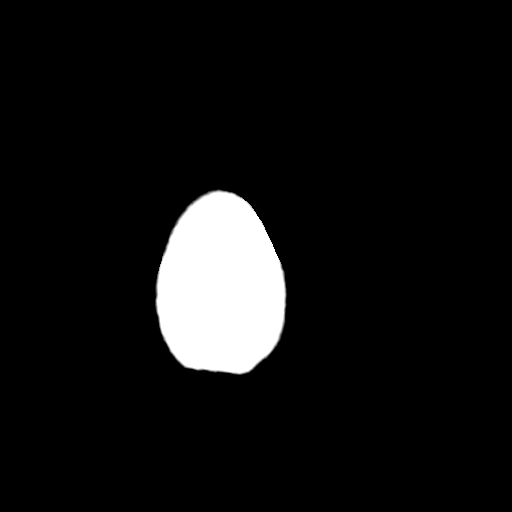

[16 of 30 positions shown; findings below may reference images not displayed]

FINDINGS: Brain: No evidence of acute infarction, hemorrhage, extra-axial
collection, ventriculomegaly, or mass effect.

Vascular: No hyperdense vessel or unexpected calcification.

Skull: Negative for fracture or focal lesion.

Sinuses/Orbits: No acute findings.

Other: None.
IMPRESSION: No acute intracranial abnormality.

## 2017-05-29 IMAGING — MR MR MRA HEAD W/O CM
9 of 11 series · 34 of 48 positions shown · non-contrast
Comparison: CT head 06/11/2015 and 06/12/2015.

CLINICAL DATA: Patient presents with an episode of feeling funny,
earlier today, with transient LEFT upper and lower extremity
weakness. No current reported neurologic deficit.

EXAM:
MRI HEAD WITHOUT CONTRAST
MRA HEAD WITHOUT CONTRAST
TECHNIQUE: Multiplanar, multiecho pulse sequences of the brain and surrounding
structures were obtained without intravenous contrast. Angiographic
images of the head were obtained using MRA technique without
contrast.

[Series 2: T1 · sagittal · 5.0mm · 0.45mm/px · 4 of 29 slices shown (1 of 2)]
[im 1/29]
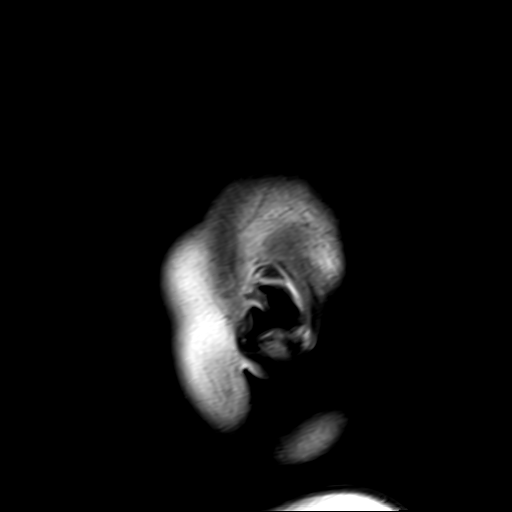
[im 10/29]
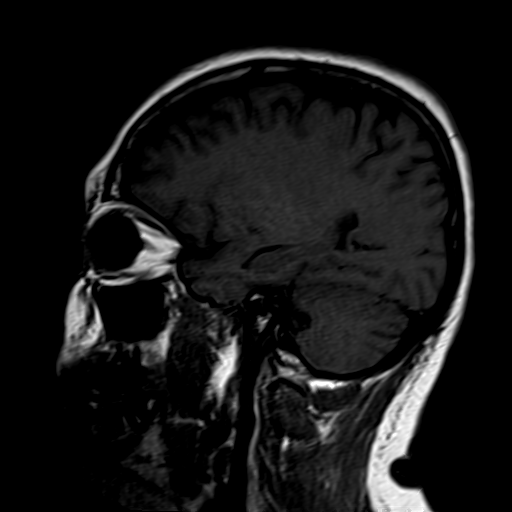
[im 19/29]
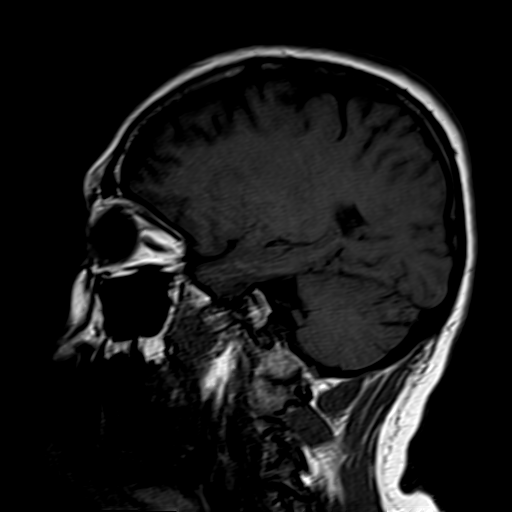
[im 29/29]
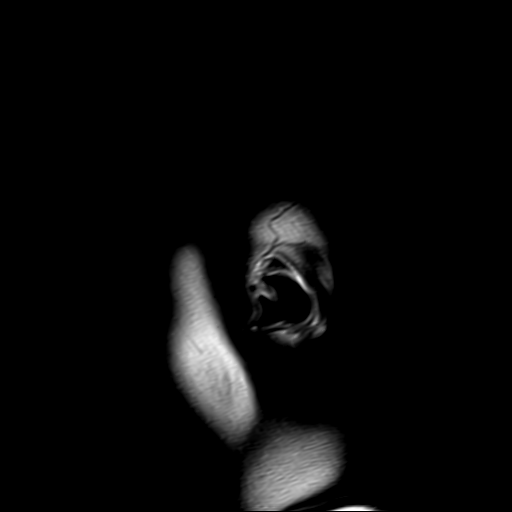

[Series 4: DWI · axial · 3.0mm · 1.80mm/px · z∈[-58,+98]mm · 5 of 55 slices shown (1 of 2)]
[im 1/55]
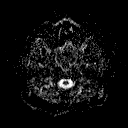
[im 14/55]
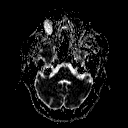
[im 28/55]
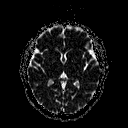
[im 41/55]
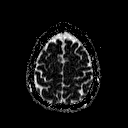
[im 55/55]
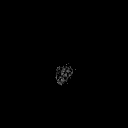

[Series 6: DWI · coronal · 3.0mm · 1.80mm/px · 4 of 49 slices shown (2 of 2)]
[im 1/49]
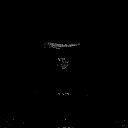
[im 17/49]
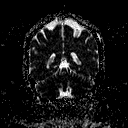
[im 33/49]
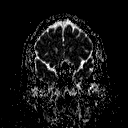
[im 49/49]
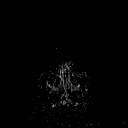

[Series 7: T2 · axial · 5.0mm · 0.60mm/px · z∈[-55,+95]mm · 2 of 25 slices shown (1 of 3)]
[im 1/25]
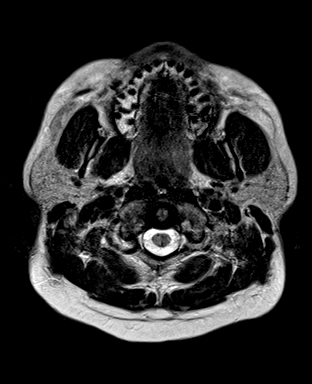
[im 25/25]
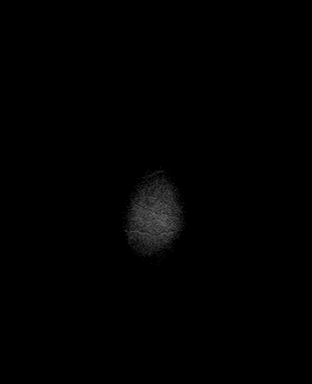

[Series 8: FLAIR · axial · 5.0mm · 0.45mm/px · z∈[-55,+95]mm · 2 of 25 slices shown]
[im 1/25]
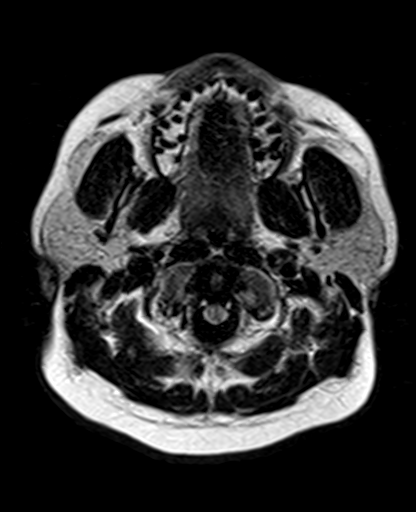
[im 25/25]
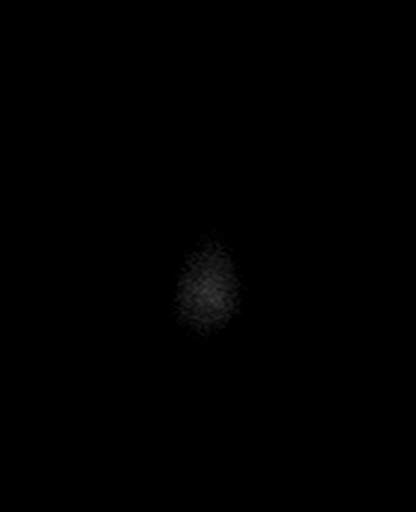

[Series 9: T2 · axial · 5.0mm · 0.45mm/px · z∈[-55,+95]mm · 2 of 25 slices shown (2 of 3)]
[im 1/25]
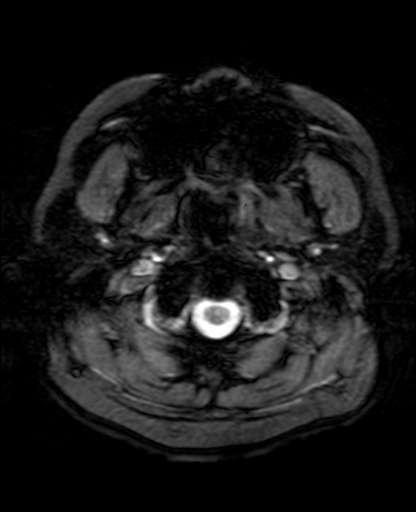
[im 25/25]
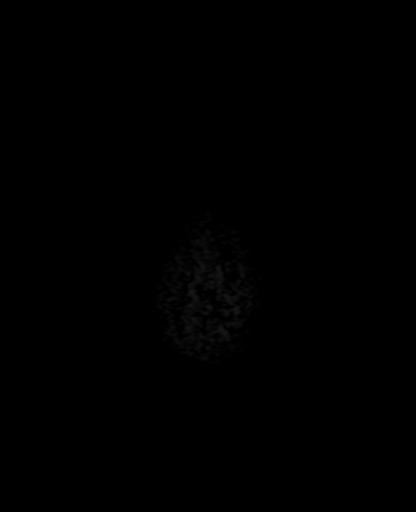

[Series 10: T1 · axial · 3.0mm · 1.00mm/px · z∈[-62,+109]mm · 5 of 60 slices shown (2 of 2)]
[im 1/60]
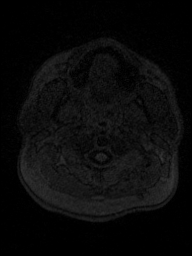
[im 15/60]
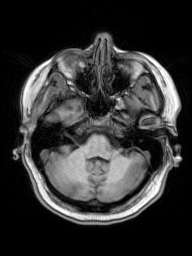
[im 30/60]
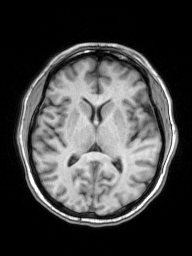
[im 45/60]
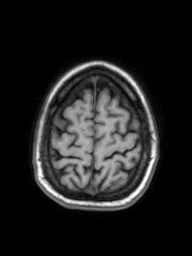
[im 60/60]
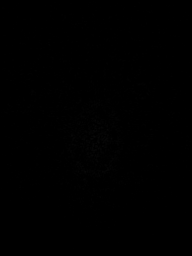

[Series 11: T2 · coronal · 5.0mm · 0.49mm/px · 3 of 29 slices shown (3 of 3)]
[im 1/29]
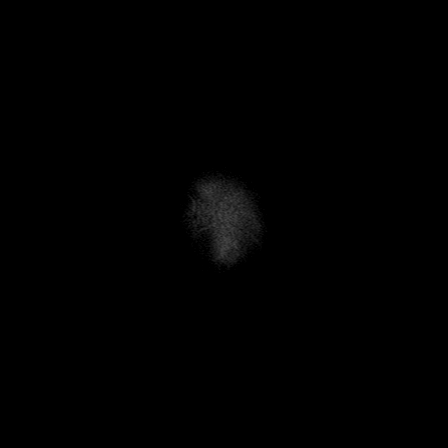
[im 15/29]
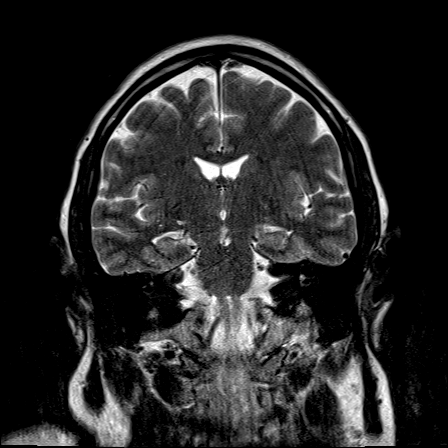
[im 29/29]
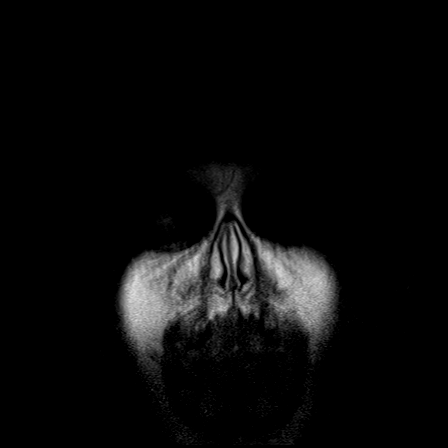

[Series 12: TOF · axial · non-contrast · 0.7mm · 0.37mm/px · z∈[-61,+16]mm · 7 of 140 slices shown]
[im 1/140]
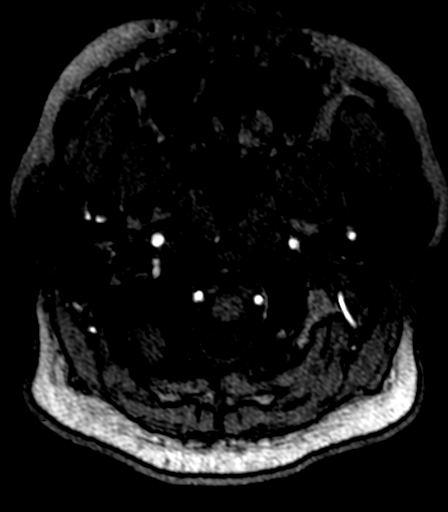
[im 26/140]
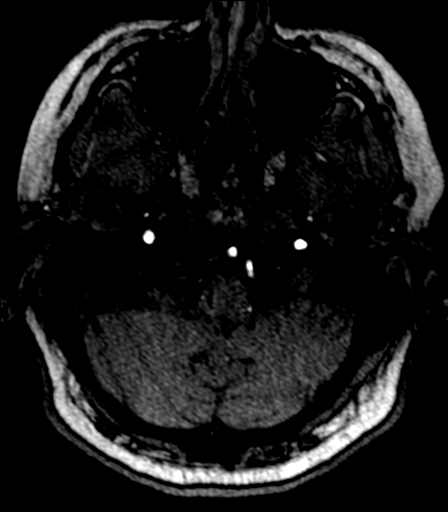
[im 38/140]
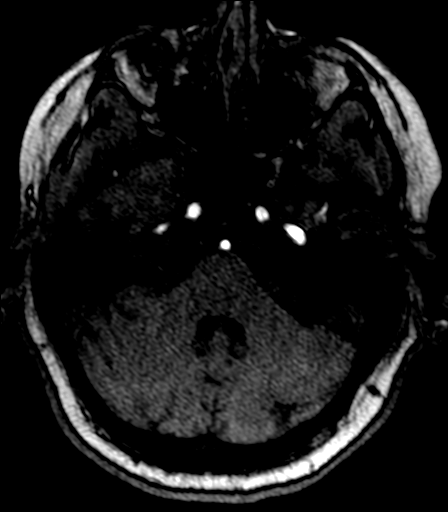
[im 64/140]
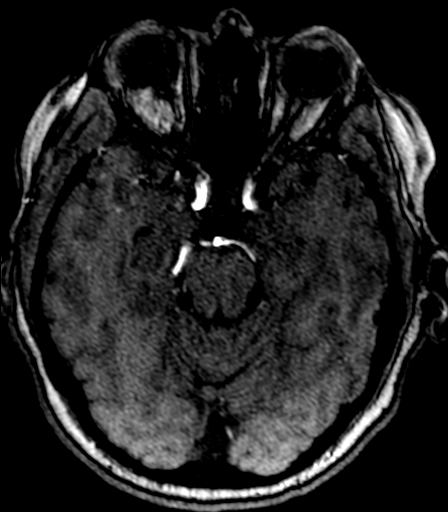
[im 76/140]
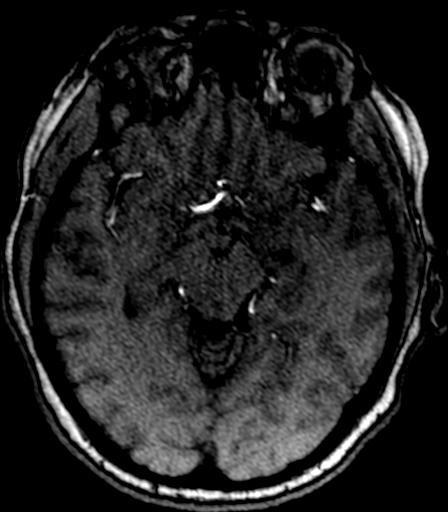
[im 102/140]
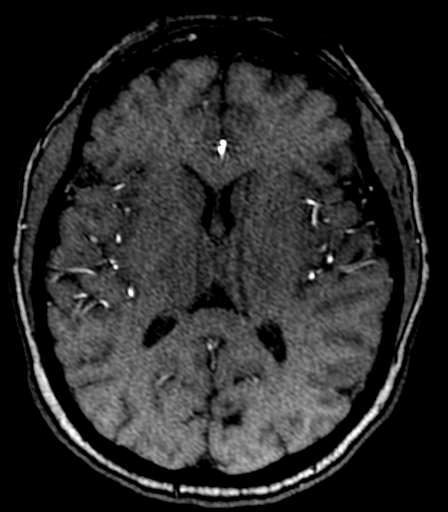
[im 114/140]
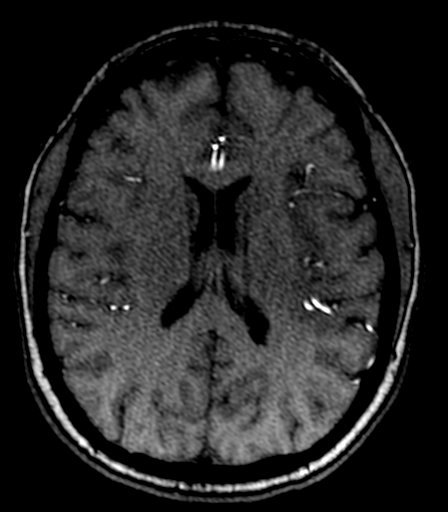

[34 of 48 positions shown; findings below may reference images not displayed]

FINDINGS: MRI HEAD FINDINGS

The patient was unable to remain motionless for the exam. Small or
subtle lesions could be overlooked.

No evidence for acute infarction, hemorrhage, mass lesion,
hydrocephalus, or extra-axial fluid. Normal cerebral volume. No
definite white matter disease.

Pituitary, pineal, and cerebellar tonsils unremarkable. No upper
cervical lesions.

Flow voids are maintained throughout the carotid, basilar, and
vertebral arteries. There are no areas of chronic hemorrhage.

Visualized calvarium, skull base, and upper cervical osseous
structures unremarkable. Scalp and extracranial soft tissues,
orbits, sinuses, and mastoids show no acute process.

Compared with recent CT, similar appearance.

MRA HEAD FINDINGS

The internal carotid arteries are widely patent. The basilar artery
is widely patent. No M1 stenosis or dissection. Dominant RIGHT A1
ACA with mild to moderate disease of the LEFT A1 and M2 ACA
segments.

Basilar artery widely patent with RIGHT vertebral dominant. No PCA
stenosis. No cerebellar branch occlusion. No visible intracranial
aneurysm.
IMPRESSION: Motion degraded exam demonstrating no acute or focal intracranial
abnormality.

No intracranial stenosis or occlusion of significance is observed.
LEFT A1 and A2 ACA disease, doubtful significance.

## 2017-05-31 ENCOUNTER — Other Ambulatory Visit: Payer: Self-pay

## 2017-05-31 DIAGNOSIS — R5383 Other fatigue: Secondary | ICD-10-CM

## 2017-06-01 ENCOUNTER — Telehealth: Payer: Self-pay

## 2017-06-01 LAB — IRON AND TIBC
IRON: 46 ug/dL (ref 27–159)
Iron Saturation: 12 % — ABNORMAL LOW (ref 15–55)
Total Iron Binding Capacity: 390 ug/dL (ref 250–450)
UIBC: 344 ug/dL (ref 131–425)

## 2017-06-01 NOTE — Telephone Encounter (Signed)
Received fax for refill on Pantoprazole 40mg .  Last OV: 05/26/17 Last lab: 05/26/17

## 2017-06-01 NOTE — Progress Notes (Signed)
Will you call this patient and inform her that her follow up blood work looks much better. Will you ask her to come in on Friday morning for an appointment to discuss her labs?

## 2017-06-02 NOTE — Telephone Encounter (Signed)
Will discuss this with the patient tomorrow at her appointment.

## 2017-06-03 ENCOUNTER — Ambulatory Visit: Payer: Self-pay

## 2017-06-10 ENCOUNTER — Ambulatory Visit: Payer: Self-pay

## 2017-06-13 ENCOUNTER — Ambulatory Visit: Payer: Self-pay

## 2017-06-13 NOTE — Progress Notes (Signed)
Patient did not attend follow-up appointment today to discuss recent lab results.  Attempted to call patient on her cell phone but she did not answer.  Left a message for patient call back to discuss results.  06/16/17: Spoke with patient and informed her of all of her lab results and provided education regarding these.  Advised her to take vitamin D3 800 units a day.  Advised patient that I could provide a prescription for this but she already has this at home.  Discussed increasing calcium intake slightly.  Advised patient to follow-up with her new primary care provider regarding her CBC, C-reactive protein, and TIBC results within the next month.  Patient reports complete resolution of symptoms and is asymptomatic. Indications to seek medical care discussed.

## 2017-06-22 ENCOUNTER — Other Ambulatory Visit: Payer: Self-pay | Admitting: Family Medicine

## 2017-06-23 ENCOUNTER — Other Ambulatory Visit: Payer: Self-pay | Admitting: Family Medicine

## 2017-06-23 NOTE — Telephone Encounter (Signed)
Copied from CRM [id number]. Topic: Quick Communication - Rx Refill/Question >> Jun 23, 2017  4:47 PM Rudi Coco, NT wrote: Medication: albuterol (PROVENTIL HFA;VENTOLIN HFA) 108 (90 Base) MCG/ACT inhaler [[id number]] Has the patient contacted their pharmacy? yes (Agent: If no, request that the patient contact the pharmacy for the refill.) Preferred Pharmacy (with phone number or street name): [id number] - [place], [place] - [address] AT [facility] [address] [place] [address] Phone: [phone] Fax: [phone]   Agent: Please be advised that RX refills may take up to 3 business days. We ask that you follow-up with your pharmacy.

## 2017-06-24 NOTE — Telephone Encounter (Signed)
DG-Can  You please take a look at this for me? Dr. Dayton Martes has not seen this patient since 2017/you saw her recently and I am wondering if you would say refill for inhaler is appropriate? Plz advise/thx dmf

## 2017-06-24 NOTE — Addendum Note (Signed)
Addended by: Wilford Corner on: 06/24/2017 03:35 PM   Modules accepted: Orders

## 2017-06-24 NOTE — Telephone Encounter (Signed)
Left message for pt. That Proventil is not her medication list. Asked pt. To check who originally ordered medication.

## 2017-06-24 NOTE — Telephone Encounter (Signed)
Medication was refill by [facility] back in May of 2018.

## 2017-06-24 NOTE — Telephone Encounter (Signed)
Patient requesting this refill of Proventil.  Last refill:06/2016 by another provider WGN:FAOZ Pharmacy: [id number] - [place], [place] - [address] AT [facility] [address] [facility] [address] [place] [phone] (Phone) [phone] (Fax)

## 2017-06-26 MED ORDER — ALBUTEROL SULFATE HFA 108 (90 BASE) MCG/ACT IN AERS: 2.0000 | INHALATION_SPRAY | Freq: Four times a day (QID) | RESPIRATORY_TRACT | 5 refills | Status: DC | PRN

## 2017-07-04 ENCOUNTER — Encounter: Payer: Self-pay | Admitting: Nurse Practitioner

## 2017-07-19 ENCOUNTER — Other Ambulatory Visit: Payer: Self-pay

## 2017-07-19 ENCOUNTER — Encounter: Payer: Self-pay | Admitting: Nurse Practitioner

## 2017-07-19 ENCOUNTER — Ambulatory Visit: Payer: Managed Care, Other (non HMO) | Attending: Nurse Practitioner | Admitting: Nurse Practitioner

## 2017-07-19 VITALS — BP 147/71 | HR 91 | Temp 98.4°F | Ht 64.0 in | Wt 250.0 lb

## 2017-07-19 DIAGNOSIS — M79605 Pain in left leg: Secondary | ICD-10-CM | POA: Insufficient documentation

## 2017-07-19 DIAGNOSIS — M5126 Other intervertebral disc displacement, lumbar region: Secondary | ICD-10-CM | POA: Insufficient documentation

## 2017-07-19 DIAGNOSIS — Z8673 Personal history of transient ischemic attack (TIA), and cerebral infarction without residual deficits: Secondary | ICD-10-CM | POA: Insufficient documentation

## 2017-07-19 DIAGNOSIS — R0602 Shortness of breath: Secondary | ICD-10-CM | POA: Diagnosis not present

## 2017-07-19 DIAGNOSIS — Z882 Allergy status to sulfonamides status: Secondary | ICD-10-CM | POA: Insufficient documentation

## 2017-07-19 DIAGNOSIS — M47816 Spondylosis without myelopathy or radiculopathy, lumbar region: Secondary | ICD-10-CM | POA: Diagnosis not present

## 2017-07-19 DIAGNOSIS — M48061 Spinal stenosis, lumbar region without neurogenic claudication: Secondary | ICD-10-CM | POA: Diagnosis not present

## 2017-07-19 DIAGNOSIS — E119 Type 2 diabetes mellitus without complications: Secondary | ICD-10-CM | POA: Insufficient documentation

## 2017-07-19 DIAGNOSIS — M79645 Pain in left finger(s): Secondary | ICD-10-CM | POA: Insufficient documentation

## 2017-07-19 DIAGNOSIS — Z975 Presence of (intrauterine) contraceptive device: Secondary | ICD-10-CM | POA: Insufficient documentation

## 2017-07-19 DIAGNOSIS — M62838 Other muscle spasm: Secondary | ICD-10-CM

## 2017-07-19 DIAGNOSIS — G5603 Carpal tunnel syndrome, bilateral upper limbs: Secondary | ICD-10-CM | POA: Insufficient documentation

## 2017-07-19 DIAGNOSIS — M79642 Pain in left hand: Secondary | ICD-10-CM | POA: Diagnosis not present

## 2017-07-19 DIAGNOSIS — G459 Transient cerebral ischemic attack, unspecified: Secondary | ICD-10-CM | POA: Diagnosis not present

## 2017-07-19 DIAGNOSIS — M5136 Other intervertebral disc degeneration, lumbar region: Secondary | ICD-10-CM | POA: Diagnosis not present

## 2017-07-19 DIAGNOSIS — Z6841 Body Mass Index (BMI) 40.0 and over, adult: Secondary | ICD-10-CM | POA: Diagnosis not present

## 2017-07-19 DIAGNOSIS — M542 Cervicalgia: Secondary | ICD-10-CM | POA: Insufficient documentation

## 2017-07-19 DIAGNOSIS — M5010 Cervical disc disorder with radiculopathy, unspecified cervical region: Secondary | ICD-10-CM | POA: Diagnosis not present

## 2017-07-19 DIAGNOSIS — Z7982 Long term (current) use of aspirin: Secondary | ICD-10-CM | POA: Diagnosis not present

## 2017-07-19 DIAGNOSIS — M4722 Other spondylosis with radiculopathy, cervical region: Secondary | ICD-10-CM | POA: Insufficient documentation

## 2017-07-19 DIAGNOSIS — M47812 Spondylosis without myelopathy or radiculopathy, cervical region: Secondary | ICD-10-CM

## 2017-07-19 DIAGNOSIS — M7918 Myalgia, other site: Secondary | ICD-10-CM | POA: Diagnosis not present

## 2017-07-19 DIAGNOSIS — G894 Chronic pain syndrome: Secondary | ICD-10-CM | POA: Diagnosis not present

## 2017-07-19 DIAGNOSIS — I10 Essential (primary) hypertension: Secondary | ICD-10-CM | POA: Diagnosis not present

## 2017-07-19 DIAGNOSIS — Z79899 Other long term (current) drug therapy: Secondary | ICD-10-CM | POA: Insufficient documentation

## 2017-07-19 DIAGNOSIS — Z79891 Long term (current) use of opiate analgesic: Secondary | ICD-10-CM | POA: Diagnosis not present

## 2017-07-19 DIAGNOSIS — Z885 Allergy status to narcotic agent status: Secondary | ICD-10-CM | POA: Insufficient documentation

## 2017-07-19 DIAGNOSIS — R200 Anesthesia of skin: Secondary | ICD-10-CM | POA: Diagnosis not present

## 2017-07-19 DIAGNOSIS — F411 Generalized anxiety disorder: Secondary | ICD-10-CM | POA: Diagnosis not present

## 2017-07-19 DIAGNOSIS — M545 Low back pain: Secondary | ICD-10-CM | POA: Diagnosis present

## 2017-07-19 DIAGNOSIS — F329 Major depressive disorder, single episode, unspecified: Secondary | ICD-10-CM | POA: Insufficient documentation

## 2017-07-19 DIAGNOSIS — Z7984 Long term (current) use of oral hypoglycemic drugs: Secondary | ICD-10-CM | POA: Insufficient documentation

## 2017-07-19 DIAGNOSIS — K219 Gastro-esophageal reflux disease without esophagitis: Secondary | ICD-10-CM | POA: Insufficient documentation

## 2017-07-19 DIAGNOSIS — Z880 Allergy status to penicillin: Secondary | ICD-10-CM | POA: Insufficient documentation

## 2017-07-19 MED ORDER — HYDROCODONE-ACETAMINOPHEN 5-325 MG PO TABS: 0.5000 | ORAL_TABLET | Freq: Two times a day (BID) | ORAL | 0 refills | Status: DC | PRN

## 2017-07-19 MED ORDER — METAXALONE 800 MG PO TABS: 800.0000 mg | ORAL_TABLET | Freq: Three times a day (TID) | ORAL | 2 refills | Status: DC | PRN

## 2017-07-19 NOTE — Progress Notes (Signed)
Nursing Pain Medication Assessment:  Safety precautions to be maintained throughout the outpatient stay will include: orient to surroundings, keep bed in low position, maintain call bell within reach at all times, provide assistance with transfer out of bed and ambulation.  Medication Inspection Compliance: Pill count conducted under aseptic conditions, in front of the patient. Neither the pills nor the bottle was removed from the patient's sight at any time. Once count was completed pills were immediately returned to the patient in their original bottle.  Medication: Hydrocodone/APAP Pill/Patch Count: 6 of 60 pills remain Pill/Patch Appearance: Markings consistent with prescribed medication Bottle Appearance: Standard pharmacy container. Clearly labeled. Filled Date: 4 / 24 / 2019 Last Medication intake:  Today

## 2017-07-19 NOTE — Progress Notes (Signed)
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 Make sure that your name is accurately spelled, that your address is correct. Check the name and dose of your medication to make sure it is accurate. Check the number of pills, and the written instructions to make sure they are clear and accurate. Make sure that you are given enough medication to last until your next medication refill appointment. 5. Taking Medication: Take medication as prescribed. Never take more pills than instructed. Never take medication more frequently than prescribed. Taking less pills or less frequently is permitted and encouraged, when it comes to controlled substances (written prescriptions).  6. Inform other Doctors: Always inform, all of your healthcare providers, of all the medications you take. 7. Pain Medication from other Providers: You are not allowed to accept any additional pain medication from any other Doctor or Healthcare provider. There are two exceptions to this rule. (see  below) In the event that you require additional pain medication, you are responsible for notifying us, as stated below. 8. Medication Agreement: You are responsible for carefully reading and following our Medication Agreement. This must be signed before receiving any prescriptions from our practice. Safely store a copy of your signed Agreement. Violations to the Agreement will result in no further prescriptions. (Additional copies of our Medication Agreement are available upon request.) 9. Laws, Rules, & Regulations: All patients are expected to follow all Federal and [employer], [employer], Rules, [employer] [facility] [employer]. Ignorance of the Laws does not constitute a valid excuse. The use of any illegal substances is prohibited. 10. Adopted CDC guidelines & recommendations: Target dosing levels will be at or below 60 MME/day. Use of benzodiazepines** is not recommended.  Exceptions: There are only two exceptions to the rule of not receiving pain medications from other Healthcare Providers. 1. Exception #1 (Emergencies): In the event of an emergency (i.e.: accident requiring emergency care), you are allowed to receive additional pain medication. However, you are responsible for: As soon as you are able, call our office [phone], at any time of the day or night, and leave a message stating your name, the date and nature of the emergency, and the name and dose of the medication prescribed. In the event that your call is answered by a member of our staff, make sure to document and save the date, time, and the name of the person that took your information.  2. Exception #2 (Planned Surgery): In the event that you are scheduled by another doctor or dentist to have any type of surgery or procedure, you are allowed (for a period no longer than 30 days), to receive additional pain  medication, for the acute post-op pain. However, in this case, you are responsible for picking up a copy of our "Post-op Pain Management for  Surgeons" handout, and giving it to your surgeon or dentist. This document is available at our office, and does not require an appointment to obtain it. Simply go to our office during business hours (Monday-Thursday from 8:00 AM to 4:00 PM) (Friday 8:00 AM to 12:00 Noon) or if you have a scheduled appointment with [name], prior to your surgery, and ask for it by name. In addition, you will need to provide [name] with your name, name of your surgeon, type of surgery, and date of procedure or surgery.  *Opioid medications include: morphine, codeine, oxycodone, oxymorphone, hydrocodone, hydromorphone, meperidine, tramadol, tapentadol, buprenorphine, fentanyl, methadone. **Benzodiazepine medications include: diazepam (Valium), alprazolam (Xanax), clonazepam (Klonopine), lorazepam (Ativan), clorazepate (Tranxene), chlordiazepoxide (Librium), estazolam (Prosom), oxazepam (Serax), temazepam (Restoril), triazolam (Halcion) (Last updated: 04/21/2017) ____________________________________________________________________________________________    BMI Assessment: Estimated body mass index is 42.91 kg/m as calculated from the following:   Height as of this encounter: '5\' 4"'  (1.626 m).   Weight as of this encounter: 250 lb (113.4 kg).  BMI interpretation table: BMI level Category Range association with higher incidence of chronic pain  <18 kg/m2 Underweight   18.5-24.9 kg/m2 Ideal body weight   25-29.9 kg/m2 Overweight Increased incidence by 20%  30-34.9 kg/m2 Obese (Class I) Increased incidence by 68%  35-39.9 kg/m2 Severe obesity (Class II) Increased incidence by 136%  >40 kg/m2 Extreme obesity (Class III) Increased incidence by 254%   Patient's current BMI Ideal Body weight  Body mass index is 42.91 kg/m. Ideal body weight: 54.7 kg (120 lb 9.5 oz) Adjusted ideal body weight: 78.2 kg (172 lb 5.7 oz)   BMI Readings from Last 4 Encounters:  07/19/17 42.91 kg/m  05/26/17 43.60 kg/m  04/29/17 43.26 kg/m   04/06/17 42.91 kg/m   Wt Readings from Last 4 Encounters:  07/19/17 250 lb (113.4 kg)  05/26/17 254 lb (115.2 kg)  04/29/17 252 lb (114.3 kg)  04/06/17 250 lb (113.4 kg)

## 2017-07-19 NOTE — Patient Instructions (Addendum)
You have been given Rx for skelaxin and 3 Rx for Vicodin to last until 10/17/2017.  ____________________________________________________________________________________________  Medication Rules  Applies to: All patients receiving prescriptions (written or electronic).  Pharmacy of record: Pharmacy where electronic prescriptions will be sent. If written prescriptions are taken to a different pharmacy, please inform the nursing staff. The pharmacy listed in the electronic medical record should be the one where you would like electronic prescriptions to be sent.  Prescription refills: Only during scheduled appointments. Applies to both, written and electronic prescriptions.  NOTE: The following applies primarily to controlled substances (Opioid* Pain Medications).   Patient's responsibilities: 1. Pain Pills: Bring all pain pills to every appointment (except for procedure appointments). 2. Pill Bottles: Bring pills in original pharmacy bottle. Always bring newest bottle. Bring bottle, even if empty. 3. Medication refills: You are responsible for knowing and keeping track of what medications you need refilled. The day before your appointment, write a list of all prescriptions that need to be refilled. Bring that list to your appointment and give it to the admitting nurse. Prescriptions will be written only during appointments. If you forget a medication, it will not be "Called in", "Faxed", or "electronically sent". You will need to get another appointment to get these prescribed. 4. Prescription Accuracy: You are responsible for carefully inspecting your prescriptions before leaving our office. Have the discharge nurse carefully go over each prescription with you, before taking them home. Make sure that your name is accurately spelled, that your address is correct. Check the name and dose of your medication to make sure it is accurate. Check the number of pills, and the written instructions to make  sure they are clear and accurate. Make sure that you are given enough medication to last until your next medication refill appointment. 5. Taking Medication: Take medication as prescribed. Never take more pills than instructed. Never take medication more frequently than prescribed. Taking less pills or less frequently is permitted and encouraged, when it comes to controlled substances (written prescriptions).  6. Inform other Doctors: Always inform, all of your healthcare providers, of all the medications you take. 7. Pain Medication from other Providers: You are not allowed to accept any additional pain medication from any other Doctor or Healthcare provider. There are two exceptions to this rule. (see below) In the event that you require additional pain medication, you are responsible for notifying us, as stated below. 8. Medication Agreement: You are responsible for carefully reading and following our Medication Agreement. This must be signed before receiving any prescriptions from our practice. Safely store a copy of your signed Agreement. Violations to the Agreement will result in no further prescriptions. (Additional copies of our Medication Agreement are available upon request.) 9. Laws, Rules, & Regulations: All patients are expected to follow all [address] and [employer], [employer], Rules, [employer]. Ignorance of the Laws does not constitute a valid excuse. The use of any illegal substances is prohibited. 10. Adopted CDC guidelines & recommendations: Target dosing levels will be at or below 60 MME/day. Use of benzodiazepines** is not recommended.  Exceptions: There are only two exceptions to the rule of not receiving pain medications from other Healthcare Providers. 1. Exception #1 (Emergencies): In the event of an emergency (i.e.: accident requiring emergency care), you are allowed to receive additional pain medication. However, you are responsible for: As soon as you are able, call our office  [phone], at any time of the day or night, and leave a message stating  your name, the date and nature of the emergency, and the name and dose of the medication prescribed. In the event that your call is answered by a member of our staff, make sure to document and save the date, time, and the name of the person that took your information.  2. Exception #2 (Planned Surgery): In the event that you are scheduled by another doctor or dentist to have any type of surgery or procedure, you are allowed (for a period no longer than 30 days), to receive additional pain medication, for the acute post-op pain. However, in this case, you are responsible for picking up a copy of our "Post-op Pain Management for Surgeons" handout, and giving it to your surgeon or dentist. This document is available at our office, and does not require an appointment to obtain it. Simply go to our office during business hours (Monday-Thursday from 8:00 AM to 4:00 PM) (Friday 8:00 AM to 12:00 Noon) or if you have a scheduled appointment with [name], prior to your surgery, and ask for it by name. In addition, you will need to provide [name] with your name, name of your surgeon, type of surgery, and date of procedure or surgery.  *Opioid medications include: morphine, codeine, oxycodone, oxymorphone, hydrocodone, hydromorphone, meperidine, tramadol, tapentadol, buprenorphine, fentanyl, methadone. **Benzodiazepine medications include: diazepam (Valium), alprazolam (Xanax), clonazepam (Klonopine), lorazepam (Ativan), clorazepate (Tranxene), chlordiazepoxide (Librium), estazolam (Prosom), oxazepam (Serax), temazepam (Restoril), triazolam (Halcion) (Last updated: 04/21/2017) ____________________________________________________________________________________________    BMI Assessment: Estimated body mass index is 42.91 kg/m as calculated from the following:   Height as of this encounter:  (1.626 m).   Weight as of this encounter: 250 lb  (113.4 kg).  BMI interpretation table: BMI level Category Range association with higher incidence of chronic pain  <18 kg/m2 Underweight   18.5-24.9 kg/m2 Ideal body weight   25-29.9 kg/m2 Overweight Increased incidence by 20%  30-34.9 kg/m2 Obese (Class I) Increased incidence by 68%  35-39.9 kg/m2 Severe obesity (Class II) Increased incidence by 136%  >40 kg/m2 Extreme obesity (Class III) Increased incidence by 254%   Patient's current BMI Ideal Body weight  Body mass index is 42.91 kg/m. Ideal body weight: 54.7 kg (120 lb 9.5 oz) Adjusted ideal body weight: 78.2 kg (172 lb 5.7 oz)   BMI Readings from Last 4 Encounters:  07/19/17 42.91 kg/m  05/26/17 43.60 kg/m  04/29/17 43.26 kg/m  04/06/17 42.91 kg/m   Wt Readings from Last 4 Encounters:  07/19/17 250 lb (113.4 kg)  05/26/17 254 lb (115.2 kg)  04/29/17 252 lb (114.3 kg)  04/06/17 250 lb (113.4 kg)

## 2017-07-20 ENCOUNTER — Other Ambulatory Visit: Payer: Self-pay | Admitting: Nurse Practitioner

## 2017-07-25 LAB — TOXASSURE SELECT 13 (MW), URINE

## 2017-07-30 ENCOUNTER — Other Ambulatory Visit: Payer: Self-pay | Admitting: Family Medicine

## 2017-08-31 ENCOUNTER — Other Ambulatory Visit: Payer: Self-pay | Admitting: Family Medicine

## 2017-08-31 NOTE — Telephone Encounter (Signed)
Patient has not seen Dr. Dayton MartesAron since 09/18/2015. A one month supply of each medication was sent in last month with a note for patient to schedule an appointment. An appointment with Dr. Dayton MartesAron was never scheduled.

## 2017-09-01 NOTE — Telephone Encounter (Signed)
Patient states he PCP is now Olean ReeDeborah Gessner she seen her last year 2018. Patient states shje needs a refill of her medication . Please see below message . Patient states she is completely out .   [id number] [place] - [address] [phone] (Phone) [phone] (Fax)

## 2017-09-01 NOTE — Telephone Encounter (Signed)
Please see refill request. Pt states you are her PCP since she has never seen Dr Dayton MartesAron. Thank you. TLG

## 2017-09-02 NOTE — Telephone Encounter (Signed)
This patient has seen me twice but has never established care with me. She has seen Dr. Dayton MartesAron many times over the years. I am happy to take her as a patient, but she needs an establish care visit before I will refill her medications.

## 2017-09-02 NOTE — Telephone Encounter (Signed)
Called and left VM for pt to return call to office.  

## 2017-09-07 ENCOUNTER — Telehealth: Payer: Self-pay

## 2017-09-07 NOTE — Telephone Encounter (Signed)
Patient is scheduled for a transfer care appointment on 10/03/17. States she is out of medication if this can be called in asap.

## 2017-09-07 NOTE — Telephone Encounter (Signed)
Copied from CRM [phone]#[id number]. Topic: Inquiry >> Sep 07, 2017 11:36 AM Yvonna Alanisobinson, Andra M wrote: Reason for CRM: Patient called requesting refills of the following medications: MetFORMIN (GLUCOPHAGE) 500 MG tablet and Spironolactone (ALDACTONE) 25 MG tablet. Patient is completely out of both. Patient's preferred pharmacy is [id number] [place] - [address] [facility] [address] [phone] (Phone) [phone] (Fax). Patient states that she now see Deboraha Sprangebbie Gessner. Please call patient today.      Thank You!!!

## 2017-09-07 NOTE — Telephone Encounter (Signed)
Called and left Vm for pt to return call to office. Patient has been seen for acute issues but has not established care with Debbie. Please schedule establish care appointment upon her return call and 30 day refills will be provided.

## 2017-09-07 NOTE — Telephone Encounter (Signed)
See 08/31/17 phonenote.

## 2017-09-08 ENCOUNTER — Other Ambulatory Visit: Payer: Self-pay | Admitting: Emergency Medicine

## 2017-09-08 MED ORDER — METFORMIN HCL 500 MG PO TABS: 500.0000 mg | ORAL_TABLET | Freq: Every day | ORAL | 0 refills | Status: DC

## 2017-09-08 MED ORDER — SPIRONOLACTONE 25 MG PO TABS: 25.0000 mg | ORAL_TABLET | Freq: Every day | ORAL | 0 refills | Status: DC

## 2017-09-08 NOTE — Telephone Encounter (Signed)
Medications electronically sent.

## 2017-09-18 ENCOUNTER — Other Ambulatory Visit: Payer: Self-pay | Admitting: Neurology

## 2017-10-01 ENCOUNTER — Other Ambulatory Visit: Payer: Self-pay | Admitting: Neurology

## 2017-10-03 ENCOUNTER — Ambulatory Visit (INDEPENDENT_AMBULATORY_CARE_PROVIDER_SITE_OTHER): Payer: Managed Care, Other (non HMO) | Admitting: Family Medicine

## 2017-10-03 ENCOUNTER — Other Ambulatory Visit: Payer: Self-pay | Admitting: Emergency Medicine

## 2017-10-03 ENCOUNTER — Encounter: Payer: Self-pay | Admitting: Family Medicine

## 2017-10-03 VITALS — BP 128/82 | HR 101 | Temp 98.4°F | Ht 64.0 in | Wt 246.5 lb

## 2017-10-03 DIAGNOSIS — E8881 Metabolic syndrome: Secondary | ICD-10-CM | POA: Diagnosis not present

## 2017-10-03 DIAGNOSIS — E559 Vitamin D deficiency, unspecified: Secondary | ICD-10-CM | POA: Diagnosis not present

## 2017-10-03 MED ORDER — VITAMIN D3 1.25 MG (50000 UT) PO TABS: 1.0000 | ORAL_TABLET | ORAL | 3 refills | Status: DC

## 2017-10-03 MED ORDER — SPIRONOLACTONE 25 MG PO TABS: 25.0000 mg | ORAL_TABLET | Freq: Every day | ORAL | 0 refills | Status: DC

## 2017-10-03 MED ORDER — METFORMIN HCL 500 MG PO TABS: 500.0000 mg | ORAL_TABLET | Freq: Every day | ORAL | 1 refills | Status: DC

## 2017-10-03 NOTE — Progress Notes (Signed)
Subjective:    Patient ID: Debra Myers, female    DOB: Mar 13, 1965, 52 y.o.   MRN: [id number]  HPI This is a 52 yo female who presents today to establish care. Previously a patient of Dr. Dayton Myers. Has seen this provider x 2 for acute visits.  Has been taking a break from work over the summer. Her daughter starts school this fall, will be living at home.    Last CPE- sees gyn regularly Mammo- 05/14/16 Pap- gyn, has IUD Td- 08/05/2006 Flu- annual Exercise- not much recently, is planning to go back to gym.   Snoring- has been told she snores excessively, no daytime fatigue unless stressed. Sleeping 7-8 hours a night, feels rested. Has a lot of nasal congestion, mouth breathes, some dry mouth in am.   PCOS- on spironolactone and metformin. Once had elevated hemoglobin A1c, last reading 5.9. Has been watching diet and has lost 12 pounds since 4/19.  Chronic back pain- sees pain management, less pain with not working and weight loss   Past Medical History:  Diagnosis Date  . Anxiety   . Asthma    ALLERGY INDUCED  . Bulging lumbar disc (L3-4) 02/26/2015  . Degenerative disc disease   . Degenerative lumbar disc   . Depression   . Diabetes mellitus    TYPE 2  . Elective abortion    ONE  . GERD (gastroesophageal reflux disease)    takes prilosec   . Hypertension   . Stroke (HCC) 06/11/2015   patient describes as mini stroke  . Vaginal delivery    ONE NSVD   Past Surgical History:  Procedure Laterality Date  . CHOLECYSTECTOMY  2001  . DILATATION & CURETTAGE/HYSTEROSCOPY WITH MYOSURE N/A 09/10/2014   Procedure: DILATATION & CURETTAGE/HYSTEROSCOPY WITH MYOSURE/INSERTION OF IUD;  Surgeon: Debra EdwardsJuan H Fernandez, MD;  Location: [facility];  Service: Gynecology;  Laterality: N/A;  . Lapband  04/2012  . mirena     inserted 09-2014  . WISDOM TOOTH EXTRACTION     Family History  Problem Relation Age of Onset  . Hypertension Father   . Diabetes Father   . Hypertension Mother   . Asthma Maternal  Grandmother   . Stroke Maternal Grandmother   . Stroke Maternal Grandfather    Social History   Tobacco Use  . Smoking status: Never Smoker  . Smokeless tobacco: Never Used  Substance Use Topics  . Alcohol use: Yes    Alcohol/week: 0.0 standard drinks    Comment: rarely  . Drug use: No      Review of Systems Per HPI    Objective:   Physical Exam Physical Exam  Vitals reviewed. Constitutional: Oriented to person, place, and time. Appears well-developed and well-nourished.  HENT:  Head: Normocephalic and atraumatic.  Eyes: Conjunctivae are normal.  Neck: Normal range of motion. Neck supple.  Cardiovascular: Normal rate.   Pulmonary/Chest: Effort normal.  Musculoskeletal: Normal range of motion.  Neurological: Alert and oriented to person, place, and time.  Skin: Skin is warm and dry.  Psychiatric: Normal mood and affect. Behavior is normal. Judgment and thought content normal.    BP (!) 168/90 (BP Location: Right Arm, Patient Position: Sitting, Cuff Size: Large)   Pulse (!) 101   Temp 98.4 F (36.9 C) (Oral)   Ht 5\' 4"  (1.626 m)   Wt 246 lb 8 oz (111.8 kg)   SpO2 97%   BMI 42.31 kg/m  Wt Readings from Last 3 Encounters:  10/03/17 246 lb 8  oz (111.8 kg)  07/19/17 250 lb (113.4 kg)  05/26/17 254 lb (115.2 kg)   BP Readings from Last 3 Encounters:  10/03/17 128/82  07/19/17 (!) 147/71  05/26/17 130/65       Assessment & Plan:  1. Vitamin D deficiency - Cholecalciferol (VITAMIN D3) 50000 units TABS; Take 1 tablet by mouth every 7 (seven) days.  Dispense: 12 tablet; Refill: 3  2. Metabolic Syndrome X - Will recheck labs at follow up in 6 months- fasting lipids, HgbA1c - encouraged continued weight loss   Debra Reeeborah Klara Stjames, FNP-BC  [facility], [facility]  10/03/2017 1:50 PM

## 2017-10-03 NOTE — Patient Instructions (Signed)
Good to see you today  Follow up in 6 months for your complete physical     

## 2017-10-12 ENCOUNTER — Other Ambulatory Visit: Payer: Self-pay | Admitting: Neurology

## 2017-10-18 ENCOUNTER — Other Ambulatory Visit: Payer: Self-pay

## 2017-10-18 ENCOUNTER — Ambulatory Visit: Payer: Managed Care, Other (non HMO) | Attending: Nurse Practitioner | Admitting: Nurse Practitioner

## 2017-10-18 ENCOUNTER — Encounter: Payer: Self-pay | Admitting: Nurse Practitioner

## 2017-10-18 VITALS — BP 139/92 | HR 98 | Temp 98.1°F | Resp 16 | Ht 64.5 in | Wt 245.0 lb

## 2017-10-18 DIAGNOSIS — Z9889 Other specified postprocedural states: Secondary | ICD-10-CM | POA: Insufficient documentation

## 2017-10-18 DIAGNOSIS — M542 Cervicalgia: Secondary | ICD-10-CM

## 2017-10-18 DIAGNOSIS — G894 Chronic pain syndrome: Secondary | ICD-10-CM | POA: Insufficient documentation

## 2017-10-18 DIAGNOSIS — G8929 Other chronic pain: Secondary | ICD-10-CM

## 2017-10-18 DIAGNOSIS — Z5181 Encounter for therapeutic drug level monitoring: Secondary | ICD-10-CM | POA: Diagnosis present

## 2017-10-18 DIAGNOSIS — Z7984 Long term (current) use of oral hypoglycemic drugs: Secondary | ICD-10-CM | POA: Insufficient documentation

## 2017-10-18 DIAGNOSIS — E282 Polycystic ovarian syndrome: Secondary | ICD-10-CM | POA: Insufficient documentation

## 2017-10-18 DIAGNOSIS — E119 Type 2 diabetes mellitus without complications: Secondary | ICD-10-CM | POA: Diagnosis not present

## 2017-10-18 DIAGNOSIS — M545 Low back pain: Secondary | ICD-10-CM | POA: Diagnosis not present

## 2017-10-18 DIAGNOSIS — K219 Gastro-esophageal reflux disease without esophagitis: Secondary | ICD-10-CM | POA: Diagnosis not present

## 2017-10-18 DIAGNOSIS — E8881 Metabolic syndrome: Secondary | ICD-10-CM | POA: Diagnosis not present

## 2017-10-18 DIAGNOSIS — M7918 Myalgia, other site: Secondary | ICD-10-CM

## 2017-10-18 DIAGNOSIS — E781 Pure hyperglyceridemia: Secondary | ICD-10-CM | POA: Insufficient documentation

## 2017-10-18 DIAGNOSIS — M62838 Other muscle spasm: Secondary | ICD-10-CM | POA: Diagnosis not present

## 2017-10-18 DIAGNOSIS — E042 Nontoxic multinodular goiter: Secondary | ICD-10-CM | POA: Insufficient documentation

## 2017-10-18 DIAGNOSIS — Z79899 Other long term (current) drug therapy: Secondary | ICD-10-CM | POA: Diagnosis not present

## 2017-10-18 DIAGNOSIS — F411 Generalized anxiety disorder: Secondary | ICD-10-CM | POA: Diagnosis not present

## 2017-10-18 DIAGNOSIS — M47816 Spondylosis without myelopathy or radiculopathy, lumbar region: Secondary | ICD-10-CM | POA: Insufficient documentation

## 2017-10-18 DIAGNOSIS — Z9049 Acquired absence of other specified parts of digestive tract: Secondary | ICD-10-CM | POA: Diagnosis not present

## 2017-10-18 DIAGNOSIS — G5603 Carpal tunnel syndrome, bilateral upper limbs: Secondary | ICD-10-CM | POA: Diagnosis not present

## 2017-10-18 MED ORDER — METAXALONE 800 MG PO TABS: 800.0000 mg | ORAL_TABLET | Freq: Three times a day (TID) | ORAL | 2 refills | Status: DC | PRN

## 2017-10-18 MED ORDER — HYDROCODONE-ACETAMINOPHEN 5-325 MG PO TABS: 0.5000 | ORAL_TABLET | Freq: Two times a day (BID) | ORAL | 0 refills | Status: DC | PRN

## 2017-10-18 MED ORDER — IBUPROFEN 800 MG PO TABS: 800.0000 mg | ORAL_TABLET | Freq: Three times a day (TID) | ORAL | 0 refills | Status: DC | PRN

## 2017-10-18 NOTE — Progress Notes (Addendum)
Nursing Pain Medication Assessment:  Safety precautions to be maintained throughout the outpatient stay will include: orient to surroundings, keep bed in low position, maintain call bell within reach at all times, provide assistance with transfer out of bed and ambulation.  Medication Inspection Compliance: Pill count conducted under aseptic conditions, in front of the patient. Neither the pills nor the bottle was removed from the patient's sight at any time. Once count was completed pills were immediately returned to the patient in their original bottle.  Medication: Hydrocodone/APAP Pill/Patch Count: 5.5 of 60 pills remain Pill/Patch Appearance: Markings consistent with prescribed medication Bottle Appearance: Standard pharmacy container. Clearly labeled. Filled Date: 7 / 24 / 2019 Last Medication intake:  Today   Paper script for Hydrocodone-acetaminophen 5-325 mg (fill date: 09/17/2017) was voided and shredded today.

## 2017-10-18 NOTE — Progress Notes (Signed)
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 administered today: Tamala Julian had no medications administered during this visit. Lab-work, procedure(s), and/or referral(s): No orders of the defined types were placed in this encounter.  Imaging and/or referral(s): None   Interventional therapies: Planned, scheduled, and/or pending:  None at this time    Considering:  Diagnostic left L3-4 lumbar epidural steroid injection  Diagnostic bilateral lumbar facet block  Possible bilateral lumbar facet radiofrequency ablation.  Diagnostic left-sided cervical epidural steroid injection  Diagnostic bilateral cervical facet block  Possible bilateral cervical facet radiofrequency ablation.    Palliative PRN treatment(s):  Diagnostic left L3-4 lumbar epidural steroid injection  Diagnostic bilateral lumbar facet block  Diagnostic left-sided cervical epidural  steroid injection  Diagnostic bilateral cervical facet block   Provider-requested follow-up: Return in about 3 months (around 01/18/2018) for MedMgmt with Me Donella Stade Edison Pace).  Future Appointments  Date Time Provider [place]  01/06/2018  2:00 PM Fontaine, Belinda Block, MD [facility]  01/10/2018 10:30 AM Vevelyn Francois, NP [facility] None  04/05/2018  8:30 AM Elby Beck, FNP [facility]   Primary Care Physician: Elby Beck, FNP Location: [facility] Note by: Vevelyn Francois NP Date: 10/18/2017; Time: 2:17 PM  Pain Score Disclaimer: We use the NRS-11 scale. This is a self-reported, subjective measurement of pain severity with only modest accuracy. It is used primarily to identify changes within a particular patient. It must be understood that outpatient pain scales are significantly less accurate that those used for research, where they can  be applied under ideal controlled circumstances with minimal exposure to variables. In reality, the score is likely to be a combination of pain intensity and pain affect, where pain affect describes the degree of emotional arousal or changes in action readiness caused by the sensory experience of pain. Factors such as social and work situation, setting, emotional state, anxiety levels, expectation, and prior pain experience may influence pain perception and show large inter-individual differences that may also be affected by time variables.  Patient instructions provided during this appointment: Patient Instructions   You have been given 3 Rx for Oxycodone with acetaminophen, Rx for ibuprofen 800 mg, and 1 Rx for skelaxin to last until 01/16/2018._______________________________________________________________________________  Medication Rules  Applies to: All patients receiving prescriptions (written or electronic).  Pharmacy of record: Pharmacy where electronic prescriptions will be sent. If written  prescriptions are taken to a different pharmacy, please inform the nursing staff. The pharmacy listed in the electronic medical record should be the one where you would like electronic prescriptions to be sent.  Prescription refills: Only during scheduled appointments. Applies to both, written and electronic prescriptions.  NOTE: The following applies primarily to controlled substances (Opioid* Pain Medications).   Patient's responsibilities: 1. Pain Pills: Bring all pain pills to every appointment (except for procedure appointments). 2. Pill Bottles: Bring pills in original pharmacy bottle. Always bring newest bottle. Bring bottle, even if empty. 3. Medication refills: You are responsible for knowing and keeping track of what medications you need refilled. The day before your appointment, write a list of all prescriptions that need to be refilled. Bring that list to your appointment and give it to the admitting nurse. Prescriptions will be written only during appointments. If you forget a medication, it will not be "Called in", "Faxed", or "electronically sent". You will need to get another appointment to get these prescribed. 4. Prescription Accuracy: You are responsible for carefully inspecting your prescriptions before leaving our office. Have the discharge nurse carefully go over each prescription with you, before taking them home. Make sure that your name is accurately spelled, that your address is correct. Check the name and dose of your medication to make sure it is accurate. Check the number of pills, and the written instructions to make sure they are clear and accurate. Make sure that you are given enough medication to last until your next medication refill appointment. 5. Taking Medication: Take medication as prescribed. Never take more pills than instructed. Never take medication more frequently than prescribed. Taking less pills or less frequently is permitted and encouraged, when it comes to  controlled substances (written prescriptions).  6. Inform other Doctors: Always inform, all of your healthcare providers, of all the medications you take. 7. Pain Medication from other Providers: You are not allowed to accept any additional pain medication from any other Doctor or Healthcare provider. There are two exceptions to this rule. (see below) In the event that you require additional pain medication, you are responsible for notifying us, as stated below. 8. Medication Agreement: You are responsible for carefully reading and following our Medication Agreement. This must be signed before receiving any prescriptions from our practice. Safely store a copy of your signed Agreement. Violations to the Agreement will result in no further prescriptions. (Additional copies of our Medication Agreement are available upon request.) 9. Laws, Rules, & Regulations: All patients are expected to follow all Federal and [employer], [employer], Rules, [employer]. Ignorance of the Laws does not constitute a valid excuse. The use  of any illegal substances is prohibited. 10. Adopted CDC guidelines & recommendations: Target dosing levels will be at or below 60 MME/day. Use of benzodiazepines** is not recommended.  Exceptions: There are only two exceptions to the rule of not receiving pain medications from other Healthcare Providers. 1. Exception #1 (Emergencies): In the event of an emergency (i.e.: accident requiring emergency care), you are allowed to receive additional pain medication. However, you are responsible for: As soon as you are able, call our office [phone], at any time of the day or night, and leave a message stating your name, the date and nature of the emergency, and the name and dose of the medication prescribed. In the event that your call is answered by a member of our staff, make sure to document and save the date, time, and the name of the person that took your information.  2. Exception #2  (Planned Surgery): In the event that you are scheduled by another doctor or dentist to have any type of surgery or procedure, you are allowed (for a period no longer than 30 days), to receive additional pain medication, for the acute post-op pain. However, in this case, you are responsible for picking up a copy of our "Post-op Pain Management for Surgeons" handout, and giving it to your surgeon or dentist. This document is available at our office, and does not require an appointment to obtain it. Simply go to our office during business hours (Monday-Thursday from 8:00 AM to 4:00 PM) (Friday 8:00 AM to 12:00 Noon) or if you have a scheduled appointment with [name], prior to your surgery, and ask for it by name. In addition, you will need to provide [name] with your name, name of your surgeon, type of surgery, and date of procedure or surgery.  *Opioid medications include: morphine, codeine, oxycodone, oxymorphone, hydrocodone, hydromorphone, meperidine, tramadol, tapentadol, buprenorphine, fentanyl, methadone. **Benzodiazepine medications include: diazepam (Valium), alprazolam (Xanax), clonazepam (Klonopine), lorazepam (Ativan), clorazepate (Tranxene), chlordiazepoxide (Librium), estazolam (Prosom), oxazepam (Serax), temazepam (Restoril), triazolam (Halcion) (Last updated: 04/21/2017) ____________________________________________________________________________________________    BMI Assessment: Estimated body mass index is 41.4 kg/m as calculated from the following:   Height as of this encounter: 5' 4.5" (1.638 m).   Weight as of this encounter: 245 lb (111.1 kg).  BMI interpretation table: BMI level Category Range association with higher incidence of chronic pain  <18 kg/m2 Underweight   18.5-24.9 kg/m2 Ideal body weight   25-29.9 kg/m2 Overweight Increased incidence by 20%  30-34.9 kg/m2 Obese (Class I) Increased incidence by 68%  35-39.9 kg/m2 Severe obesity (Class II) Increased incidence by 136%  >40  kg/m2 Extreme obesity (Class III) Increased incidence by 254%   Patient's current BMI Ideal Body weight  Body mass index is 41.4 kg/m. Ideal body weight: 55.8 kg (123 lb 2 oz) Adjusted ideal body weight: 78 kg (171 lb 14 oz)   BMI Readings from Last 4 Encounters:  10/18/17 41.40 kg/m  10/03/17 42.31 kg/m  07/19/17 42.91 kg/m  05/26/17 43.60 kg/m   Wt Readings from Last 4 Encounters:  10/18/17 245 lb (111.1 kg)  10/03/17 246 lb 8 oz (111.8 kg)  07/19/17 250 lb (113.4 kg)  05/26/17 254 lb (115.2 kg)   .

## 2017-10-18 NOTE — Patient Instructions (Addendum)
You have been given 3 Rx for Oxycodone with acetaminophen, Rx for ibuprofen 800 mg, and 1 Rx for skelaxin to last until 01/16/2018._______________________________________________________________________________  Medication Rules  Applies to: All patients receiving prescriptions (written or electronic).  Pharmacy of record: Pharmacy where electronic prescriptions will be sent. If written prescriptions are taken to a different pharmacy, please inform the nursing staff. The pharmacy listed in the electronic medical record should be the one where you would like electronic prescriptions to be sent.  Prescription refills: Only during scheduled appointments. Applies to both, written and electronic prescriptions.  NOTE: The following applies primarily to controlled substances (Opioid* Pain Medications).   Patient's responsibilities: 1. Pain Pills: Bring all pain pills to every appointment (except for procedure appointments). 2. Pill Bottles: Bring pills in original pharmacy bottle. Always bring newest bottle. Bring bottle, even if empty. 3. Medication refills: You are responsible for knowing and keeping track of what medications you need refilled. The day before your appointment, write a list of all prescriptions that need to be refilled. Bring that list to your appointment and give it to the admitting nurse. Prescriptions will be written only during appointments. If you forget a medication, it will not be "Called in", "Faxed", or "electronically sent". You will need to get another appointment to get these prescribed. 4. Prescription Accuracy: You are responsible for carefully inspecting your prescriptions before leaving our office. Have the discharge nurse carefully go over each prescription with you, before taking them home. Make sure that your name is accurately spelled, that your address is correct. Check the name and dose of your medication to make sure it is accurate. Check the number of pills, and the  written instructions to make sure they are clear and accurate. Make sure that you are given enough medication to last until your next medication refill appointment. 5. Taking Medication: Take medication as prescribed. Never take more pills than instructed. Never take medication more frequently than prescribed. Taking less pills or less frequently is permitted and encouraged, when it comes to controlled substances (written prescriptions).  6. Inform other Doctors: Always inform, all of your healthcare providers, of all the medications you take. 7. Pain Medication from other Providers: You are not allowed to accept any additional pain medication from any other Doctor or Healthcare provider. There are two exceptions to this rule. (see below) In the event that you require additional pain medication, you are responsible for notifying us, as stated below. 8. Medication Agreement: You are responsible for carefully reading and following our Medication Agreement. This must be signed before receiving any prescriptions from our practice. Safely store a copy of your signed Agreement. Violations to the Agreement will result in no further prescriptions. (Additional copies of our Medication Agreement are available upon request.) 9. Laws, Rules, & Regulations: All patients are expected to follow all [address] and [employer] Laws, [employer], Rules, [employer] Regulations. Ignorance of the Laws does not constitute a valid excuse. The use of any illegal substances is prohibited. 10. Adopted CDC guidelines & recommendations: Target dosing levels will be at or below 60 MME/day. Use of benzodiazepines** is not recommended.  Exceptions: There are only two exceptions to the rule of not receiving pain medications from other Healthcare Providers. 1. Exception #1 (Emergencies): In the event of an emergency (i.e.: accident requiring emergency care), you are allowed to receive additional pain medication. However, you are responsible for: As soon as you  are able, call our office [phone], at any time of the day or  night, and leave a message stating your name, the date and nature of the emergency, and the name and dose of the medication prescribed. In the event that your call is answered by a member of our staff, make sure to document and save the date, time, and the name of the person that took your information.  2. Exception #2 (Planned Surgery): In the event that you are scheduled by another doctor or dentist to have any type of surgery or procedure, you are allowed (for a period no longer than 30 days), to receive additional pain medication, for the acute post-op pain. However, in this case, you are responsible for picking up a copy of our "Post-op Pain Management for Surgeons" handout, and giving it to your surgeon or dentist. This document is available at our office, and does not require an appointment to obtain it. Simply go to our office during business hours (Monday-Thursday from 8:00 AM to 4:00 PM) (Friday 8:00 AM to 12:00 Noon) or if you have a scheduled appointment with [name], prior to your surgery, and ask for it by name. In addition, you will need to provide [name] with your name, name of your surgeon, type of surgery, and date of procedure or surgery.  *Opioid medications include: morphine, codeine, oxycodone, oxymorphone, hydrocodone, hydromorphone, meperidine, tramadol, tapentadol, buprenorphine, fentanyl, methadone. **Benzodiazepine medications include: diazepam (Valium), alprazolam (Xanax), clonazepam (Klonopine), lorazepam (Ativan), clorazepate (Tranxene), chlordiazepoxide (Librium), estazolam (Prosom), oxazepam (Serax), temazepam (Restoril), triazolam (Halcion) (Last updated: 04/21/2017) ____________________________________________________________________________________________    BMI Assessment: Estimated body mass index is 41.4 kg/m as calculated from the following:   Height as of this encounter: 5' 4.5" (1.638 m).   Weight as of  this encounter: 245 lb (111.1 kg).  BMI interpretation table: BMI level Category Range association with higher incidence of chronic pain  <18 kg/m2 Underweight   18.5-24.9 kg/m2 Ideal body weight   25-29.9 kg/m2 Overweight Increased incidence by 20%  30-34.9 kg/m2 Obese (Class I) Increased incidence by 68%  35-39.9 kg/m2 Severe obesity (Class II) Increased incidence by 136%  >40 kg/m2 Extreme obesity (Class III) Increased incidence by 254%   Patient's current BMI Ideal Body weight  Body mass index is 41.4 kg/m. Ideal body weight: 55.8 kg (123 lb 2 oz) Adjusted ideal body weight: 78 kg (171 lb 14 oz)   BMI Readings from Last 4 Encounters:  10/18/17 41.40 kg/m  10/03/17 42.31 kg/m  07/19/17 42.91 kg/m  05/26/17 43.60 kg/m   Wt Readings from Last 4 Encounters:  10/18/17 245 lb (111.1 kg)  10/03/17 246 lb 8 oz (111.8 kg)  07/19/17 250 lb (113.4 kg)  05/26/17 254 lb (115.2 kg)

## 2017-11-11 ENCOUNTER — Other Ambulatory Visit: Payer: Self-pay | Admitting: Neurology

## 2017-11-17 ENCOUNTER — Other Ambulatory Visit: Payer: Self-pay | Admitting: Pain Medicine

## 2017-11-17 ENCOUNTER — Other Ambulatory Visit: Payer: Self-pay | Admitting: Family Medicine

## 2017-11-17 DIAGNOSIS — M7918 Myalgia, other site: Secondary | ICD-10-CM

## 2017-11-17 DIAGNOSIS — M62838 Other muscle spasm: Secondary | ICD-10-CM

## 2017-11-17 NOTE — Telephone Encounter (Signed)
DG-Plz see refill req/thx dmf 

## 2017-11-21 ENCOUNTER — Other Ambulatory Visit: Payer: Self-pay | Admitting: Family Medicine

## 2017-11-21 ENCOUNTER — Other Ambulatory Visit: Payer: Self-pay | Admitting: Neurology

## 2017-11-21 MED ORDER — ATORVASTATIN CALCIUM 10 MG PO TABS: 10.0000 mg | ORAL_TABLET | Freq: Every day | ORAL | 3 refills | Status: DC

## 2017-11-21 NOTE — Telephone Encounter (Signed)
Refill request for lipitor 10 mg; pt established care on 10/03/17 with Harlin Heys FNP; 04/05/18 appt for CPX. Last lipid per lab result tab was 06/12/2015. Dr Everlena Cooper prescribed lipitor 10 mg # 30 on 10/12/17. There is a pending request to Dr Everlena Cooper as well for lipitor 10 mg. Please advise.

## 2017-12-16 ENCOUNTER — Emergency Department (HOSPITAL_COMMUNITY)
Admission: EM | Admit: 2017-12-16 | Discharge: 2017-12-16 | Disposition: A | Discharge status: Home / Self Care | Payer: Managed Care, Other (non HMO) | Attending: Emergency Medicine | Admitting: Emergency Medicine

## 2017-12-16 ENCOUNTER — Emergency Department (HOSPITAL_BASED_OUTPATIENT_CLINIC_OR_DEPARTMENT_OTHER): Payer: Managed Care, Other (non HMO)

## 2017-12-16 ENCOUNTER — Encounter (HOSPITAL_COMMUNITY): Payer: Self-pay

## 2017-12-16 ENCOUNTER — Ambulatory Visit: Payer: Self-pay | Admitting: *Deleted

## 2017-12-16 ENCOUNTER — Other Ambulatory Visit: Payer: Self-pay

## 2017-12-16 DIAGNOSIS — Z8673 Personal history of transient ischemic attack (TIA), and cerebral infarction without residual deficits: Secondary | ICD-10-CM | POA: Insufficient documentation

## 2017-12-16 DIAGNOSIS — Z7984 Long term (current) use of oral hypoglycemic drugs: Secondary | ICD-10-CM | POA: Insufficient documentation

## 2017-12-16 DIAGNOSIS — Z79899 Other long term (current) drug therapy: Secondary | ICD-10-CM | POA: Insufficient documentation

## 2017-12-16 DIAGNOSIS — M79605 Pain in left leg: Secondary | ICD-10-CM | POA: Diagnosis present

## 2017-12-16 DIAGNOSIS — R52 Pain, unspecified: Secondary | ICD-10-CM

## 2017-12-16 DIAGNOSIS — J45909 Unspecified asthma, uncomplicated: Secondary | ICD-10-CM | POA: Insufficient documentation

## 2017-12-16 DIAGNOSIS — M25562 Pain in left knee: Secondary | ICD-10-CM

## 2017-12-16 DIAGNOSIS — E119 Type 2 diabetes mellitus without complications: Secondary | ICD-10-CM | POA: Insufficient documentation

## 2017-12-16 LAB — CBC WITH DIFFERENTIAL/PLATELET
Abs Immature Granulocytes: 0.03 10*3/uL (ref 0.00–0.07)
BASOS ABS: 0.1 10*3/uL (ref 0.0–0.1)
BASOS PCT: 1 %
EOS ABS: 0.3 10*3/uL (ref 0.0–0.5)
Eosinophils Relative: 3 %
HEMATOCRIT: 38.5 % (ref 36.0–46.0)
Hemoglobin: 12.1 g/dL (ref 12.0–15.0)
IMMATURE GRANULOCYTES: 0 %
LYMPHS ABS: 2.3 10*3/uL (ref 0.7–4.0)
Lymphocytes Relative: 24 %
MCH: 26.5 pg (ref 26.0–34.0)
MCHC: 31.4 g/dL (ref 30.0–36.0)
MCV: 84.4 fL (ref 80.0–100.0)
Monocytes Absolute: 0.5 10*3/uL (ref 0.1–1.0)
Monocytes Relative: 5 %
NEUTROS PCT: 67 %
NRBC: 0 % (ref 0.0–0.2)
Neutro Abs: 6.6 10*3/uL (ref 1.7–7.7)
PLATELETS: 256 10*3/uL (ref 150–400)
RBC: 4.56 MIL/uL (ref 3.87–5.11)
RDW: 14.1 % (ref 11.5–15.5)
WBC: 9.8 10*3/uL (ref 4.0–10.5)

## 2017-12-16 LAB — BASIC METABOLIC PANEL
ANION GAP: 6 (ref 5–15)
BUN: 18 mg/dL (ref 6–20)
CALCIUM: 8.7 mg/dL — AB (ref 8.9–10.3)
CO2: 24 mmol/L (ref 22–32)
Chloride: 108 mmol/L (ref 98–111)
Creatinine, Ser: 0.94 mg/dL (ref 0.44–1.00)
GFR calc non Af Amer: >60 mL/min (ref >60)
Glucose, Bld: 95 mg/dL (ref 70–99)
Potassium: 3.9 mmol/L (ref 3.5–5.1)
SODIUM: 138 mmol/L (ref 135–145)

## 2017-12-16 NOTE — Telephone Encounter (Signed)
Pt called in c/o pain behind her left knee which has been there for 3 days and is not getting better.   It ached last night so that she had a hard time sleeping.    She is concerned about having a blood clot.   She has a history of having a TIA in 2017.  I have referred her to the ED to r/o a blood clot.    See triage notes.  She is going to [facility] ED.  I routed a note to [employer] [facility] nurse pool.    Reason for Disposition . [1] Thigh, calf, or ankle swelling AND [2] only 1 side  Answer Assessment - Initial Assessment Questions 1. ONSET: "When did the swelling start?" (e.g., minutes, hours, days)     My left knee is swollen behind the knee.  I put ice on it.   It's hurting for 3 days.  2. LOCATION: "What part of the leg is swollen?"  "Are both legs swollen or just one leg?"     Just behind my left knee.   I can feel it when I bend it.    I can see some veins in it.   It just does not feel right.    2017 I had a mini stroke.  I'm on a statin. 3. SEVERITY: "How bad is the swelling?" (e.g., localized; mild, moderate, severe)  - Localized - small area of swelling localized to one leg  - MILD pedal edema - swelling limited to foot and ankle, pitting edema < 1/4 inch (6 mm) deep, rest and elevation eliminate most or all swelling  - MODERATE edema - swelling of lower leg to knee, pitting edema > 1/4 inch (6 mm) deep, rest and elevation only partially reduce swelling  - SEVERE edema - swelling extends above knee, facial or hand swelling present      My leg hurts more sitting than standing.  I have chronic back pain so I tend to ignore pain.   4. REDNESS: "Does the swelling look red or infected?"     It's not red. 5. PAIN: "Is the swelling painful to touch?" If so, ask: "How painful is it?"   (Scale 1-10; mild, moderate or severe)     It feels like an achy.   Last night I couldn't get comfortable due to the discomfort.   This morning it was hurting too.     6. FEVER: "Do you have a  fever?" If so, ask: "What is it, how was it measured, and when did it start?"      No 7. CAUSE: "What do you think is causing the leg swelling?"     I'm concerned about a blood clot.    8. MEDICAL HISTORY: "Do you have a history of heart failure, kidney disease, liver failure, or cancer?"     I take medicine a diuretic.  I have PCOS.   9. RECURRENT SYMPTOM: "Have you had leg swelling before?" If so, ask: "When was the last time?" "What happened that time?"     No. 10. OTHER SYMPTOMS: "Do you have any other symptoms?" (e.g., chest pain, difficulty breathing)       It just aches/hurts.   11. PREGNANCY: "Is there any chance you are pregnant?" "When was your last menstrual period?"       No!!  Protocols used: LEG SWELLING AND EDEMA-A-AH

## 2017-12-16 NOTE — Discharge Instructions (Addendum)
You are seen in the emergency department today for left knee/calf pain.  Your ultrasound did not show a clot otherwise it is a DVT or a Baker's cyst.  Your lab work was all reassuring.  Labs were normal with the exception of a mildly low calcium at 8.7, be sure to eat plenty of foods high in calcium.   Continue Tylenol/Motrin per over-the-counter dosing instructions for any continued discomfort.  Please follow-up with your primary care provider within 1 week for reevaluation.  Return to the ER for new or worsening symptoms or any other concerns.  Additionally have your blood pressure rechecked at your primary care appointment as it was elevated in the ER today. Vitals:   12/16/17 1645  BP: (!) 150/96  Pulse: 96  Resp: 16  Temp: (!) 97.5 F (36.4 C)  SpO2: 100%

## 2017-12-16 NOTE — ED Triage Notes (Addendum)
patient c/o left posterior knee  pain x 3 days. Patient went to her PCP today and was sent to the ED to rule out DVT

## 2017-12-16 NOTE — ED Provider Notes (Signed)
[facility]-EMERGENCY DEPT Provider Note   CSN: [id number] Arrival date & time: 12/16/17  1636     History   Chief Complaint Chief Complaint  Patient presents with  . Knee Pain    HPI Debra Myers is a 52 y.o. female with history of asthma, anxiety, chronic back pain, hypertension, stroke, and hyperlipidemia who presents to the emergency department at request of her primary care provider for DVT rule out study.  States that she has had pain to the left posterior proximal calf and posterior knee for the past 3 days.  States pain has been fairly constant, achy in nature, and a 3 out of 10 in severity.  She states it is worsened when she sits for too long a period of time, improved with movement and with taking Tylenol at home.  No other specific alleviating or aggravating factors.  She thought that the area might have been a bit swollen but is not completely sure.  She has not noted any areas of redness, wounds, or increased warmth.  She denies fever or chills.  She denies numbness, weakness, or paresthesias. She denies recent injury or change in activity.  She does however note that the pain started while she was wearing her daughter's compression stockings which came up to the mid calf area.  She states that when she removed and the pain seemed to persist.  PCP office sent her here today to have an ultrasound to rule out DVT.  Denies chest pain or difficulty breathing.  HPI  Past Medical History:  Diagnosis Date  . Anxiety   . Asthma    ALLERGY INDUCED  . Bulging lumbar disc (L3-4) 02/26/2015  . Degenerative disc disease   . Degenerative lumbar disc   . Depression   . Diabetes mellitus    TYPE 2  . Elective abortion    ONE  . GERD (gastroesophageal reflux disease)    takes prilosec   . Hypertension   . Stroke (HCC) 06/11/2015   patient describes as mini stroke  . Vaginal delivery    ONE NSVD    Patient Active Problem List   Diagnosis Date Noted  . DDD  (degenerative disc disease), lumbar 04/11/2017  . DDD (degenerative disc disease), cervical 04/11/2017  . Well woman exam 09/15/2016  . Chronic pain syndrome 01/30/2016  . Morbid obesity with BMI of 40.0-44.9, adult (HCC) 01/30/2016  . Muscle spasm, nocturnal 08/05/2015  . Musculoskeletal pain 08/05/2015  . Carpal tunnel syndrome (Bilateral) (L>R) 08/05/2015  . Cervical facet syndrome (Location of Tertiary source of pain) (Bilateral) (L>R) 08/05/2015  . TIA (transient ischemic attack) 06/12/2015  . Cervical spondylosis (C5-6 & C6-7 DDD) 03/04/2015  . Cervical (3 mm) Grade 1 Anterolisthesis of C4 over C5 03/04/2015  . Chronic neck pain (Location of Tertiary source of pain) (Bilateral) (L>R) 03/04/2015  . Chronic cervical radicular pain (Bilateral) (L>R) 03/04/2015  . Cervical foraminal stenosis (multilevel) (Bilateral) 03/04/2015  . Long term current use of opiate analgesic 02/26/2015  . Encounter for therapeutic drug level monitoring 02/26/2015  . Encounter for chronic pain management 02/26/2015  . Numbness of upper extremity 02/26/2015  . Radiculitis involving upper extremity 02/26/2015  . Chronic low back pain (Location of Primary Source of Pain) (Bilateral) (L>R) 02/26/2015  . Lumbar spondylosis (Bulging Disc & Severe Left Foraminal Stenosis at L3-4) 02/26/2015  . Chronic lower extremity pain (Location of Secondary source of pain) (Left) 02/26/2015  . Lumbar foraminal stenosis (Severe Left L3-4) 02/26/2015  .  Lumbar facet syndrome (Location of Primary Source of Pain) (Bilateral) (L>R) 02/26/2015  . Opiate use (10 MME/Day) 12/02/2014  . Long term prescription opiate use 12/02/2014  . IUD (intrauterine device) in place 10/01/2014  . GERD (gastroesophageal reflux disease) 01/30/2014  . Overweight(278.02) 02/14/2012  . Hypertension 07/09/2011  . HYPERTRIGLYCERIDEMIA 12/27/2008  . GOITER, NONTOXIC MULTINODULAR 12/14/2006  . Diabetes (HCC) 12/14/2006  . Polycystic ovaries 12/14/2006    . Metabolic Syndrome X 12/14/2006  . OBESITY 12/14/2006  . Anxiety 12/14/2006  . Allergic rhinitis 12/14/2006  . Asthma 12/14/2006  . Hirsutism 12/14/2006  . Generalized anxiety disorder 12/14/2006    Past Surgical History:  Procedure Laterality Date  . CHOLECYSTECTOMY  2001  . DILATATION & CURETTAGE/HYSTEROSCOPY WITH MYOSURE N/A 09/10/2014   Procedure: DILATATION & CURETTAGE/HYSTEROSCOPY WITH MYOSURE/INSERTION OF IUD;  Surgeon: Ok Edwards, MD;  Location: [facility];  Service: Gynecology;  Laterality: N/A;  . lap band surgery    . Lapband  04/2012  . mirena     inserted 09-2014  . WISDOM TOOTH EXTRACTION       OB History    Gravida  2   Para  1   Term  1   Preterm      AB  1   Living  1     SAB  1   TAB      Ectopic      Multiple      Live Births  1            Home Medications    Prior to Admission medications   Medication Sig Start Date End Date Taking? Authorizing Provider  albuterol (PROVENTIL HFA;VENTOLIN HFA) 108 (90 Base) MCG/ACT inhaler Inhale 2 puffs into the lungs every 6 (six) hours as needed for wheezing or shortness of breath. 06/26/17   Emi Belfast, FNP  atorvastatin (LIPITOR) 10 MG tablet Take 1 tablet (10 mg total) by mouth daily. 11/21/17   Emi Belfast, FNP  buPROPion (WELLBUTRIN XL) 300 MG 24 hr tablet Take 300 mg by mouth daily.    [provider]  cetirizine (ZYRTEC) 10 MG tablet Take 10 mg by mouth at bedtime.    [provider]  Cholecalciferol (VITAMIN D3) 50000 units TABS Take 1 tablet by mouth every 7 (seven) days. 10/03/17   Emi Belfast, FNP  fluticasone (FLONASE) 50 MCG/ACT nasal spray Place 2 sprays into both nostrils daily as needed for rhinitis. 04/29/17   Emi Belfast, FNP  HYDROcodone-acetaminophen (NORCO/VICODIN) 5-325 MG tablet Take 0.5-1 tablets by mouth 2 (two) times daily as needed for severe pain. 12/17/17 01/16/18  Barbette Merino, NP  HYDROcodone-acetaminophen (NORCO/VICODIN)  5-325 MG tablet Take 0.5-1 tablets by mouth 2 (two) times daily as needed for severe pain. 11/17/17 12/17/17  Barbette Merino, NP  HYDROcodone-acetaminophen (NORCO/VICODIN) 5-325 MG tablet Take 0.5-1 tablets by mouth 2 (two) times daily as needed for severe pain. 10/18/17 11/17/17  Barbette Merino, NP  lamoTRIgine (LAMICTAL) 200 MG tablet Take 300 mg by mouth at bedtime.     [provider]  LORazepam (ATIVAN) 1 MG tablet Take 0.5-1 mg by mouth every 8 (eight) hours as needed for anxiety or sleep.     [provider]  metaxalone (SKELAXIN) 800 MG tablet Take 1 tablet (800 mg total) by mouth 3 (three) times daily as needed for muscle spasms. 10/18/17 01/16/18  Barbette Merino, NP  metFORMIN (GLUCOPHAGE) 500 MG tablet Take 1 tablet (500 mg total)  by mouth daily with breakfast. 10/03/17   Emi Belfast, FNP  montelukast (SINGULAIR) 10 MG tablet TAKE 1 TABLET BY MOUTH EVERY NIGHT AT BEDTIME 11/17/17   Emi Belfast, FNP  PARoxetine (PAXIL-CR) 25 MG 24 hr tablet Take 50 mg by mouth at bedtime.     [provider]  RA ASPIRIN EC ADULT LOW ST 81 MG EC tablet take 1 tablet by mouth once daily 06/14/16   Dianne Dun, MD  spironolactone (ALDACTONE) 25 MG tablet Take 1 tablet (25 mg total) by mouth daily. 10/03/17   Emi Belfast, FNP  VYVANSE 40 MG capsule Take 40 mg by mouth daily. 09/14/17   [provider]    Family History Family History  Problem Relation Age of Onset  . Hypertension Father   . Diabetes Father   . Hypertension Mother   . Asthma Maternal Grandmother   . Stroke Maternal Grandmother   . Stroke Maternal Grandfather     Social History Social History   Tobacco Use  . Smoking status: Never Smoker  . Smokeless tobacco: Never Used  Substance Use Topics  . Alcohol use: Yes    Alcohol/week: 0.0 standard drinks    Comment: rarely  . Drug use: No     Allergies   Oxycodone; Penicillins; Red dye; and Sulfa antibiotics   Review of  Systems Review of Systems  Constitutional: Negative for chills and fever.  Respiratory: Negative for shortness of breath.   Cardiovascular: Negative for chest pain.  Musculoskeletal: Positive for arthralgias and myalgias.  Skin: Negative for color change and wound.  Neurological: Negative for weakness and numbness.  All other systems reviewed and are negative.    Physical Exam Updated Vital Signs BP (!) 150/96 (BP Location: Left Arm)   Pulse 96   Temp (!) 97.5 F (36.4 C) (Oral)   Resp 16   Ht 5\' 4"  (1.626 m)   Wt 117 kg   SpO2 100%   BMI 44.29 kg/m   Physical Exam  Constitutional: She appears well-developed and well-nourished. No distress.  HENT:  Head: Normocephalic and atraumatic.  Eyes: Conjunctivae are normal. Right eye exhibits no discharge. Left eye exhibits no discharge.  Cardiovascular: Normal rate and regular rhythm.  Pulses:      Dorsalis pedis pulses are 2+ on the right side, and 2+ on the left side.       Posterior tibial pulses are 2+ on the right side, and 2+ on the left side.  Pulmonary/Chest: Effort normal and breath sounds normal.  Abdominal: Soft. She exhibits no distension. There is no tenderness.  Musculoskeletal:  Lower extremities: No obvious deformity, appreciable swelling, edema, erythema, warmth, ecchymosis, or open wounds.  Patient has full active range of motion to bilateral hips, knees, ankles, and all digits.  Compartments are soft.  She has mild tenderness to palpation to the proximal gastrocnemius extending to the popliteal fossa as well as the distal aspect of the posterior upper leg.  There is no point/focal bony tenderness to palpation.  No palpable joint instability.  Neurovascularly intact distally.  Neurological: She is alert.  Clear speech.  Sensation grossly intact bilateral lower extremities.  5 out of 5 strength plantar dorsiflexion bilaterally.  Psychiatric: She has a normal mood and affect. Her behavior is normal. Thought content  normal.  Nursing note and vitals reviewed.    ED Treatments / Results  Labs (all labs ordered are listed, but only abnormal results are displayed) Labs Reviewed  BASIC METABOLIC  PANEL - Abnormal; Notable for the following components:      Result Value   Calcium 8.7 (*)    All other components within normal limits  CBC WITH DIFFERENTIAL/PLATELET    EKG None  Radiology No results found.  Procedures Procedures (including critical care time)  Medications Ordered in ED Medications - No data to display   Initial Impression / Assessment and Plan / ED Course  I have reviewed the triage vital signs and the nursing notes.  Pertinent labs & imaging results that were available during my care of the patient were reviewed by me and considered in my medical decision making (see chart for details).   Patient presents to the emergency department with atrumatic left posterior knee/proximal calf pain request of her primary care provider for DVT rule out study.  Patient nontoxic-appearing, no apparent distress, vitals with elevated BP, doubt HTN emergency, patient aware of need for recheck.  On exam patient without erythema/warmth, limitation in range of motion, or fever to raise concern for septic joint or cellulitis.  Given good range of motion and lack of erythema low suspicion for gout.  Given lack of traumatic injury I do not suspect fracture or dislocation.  Basic labs obtained and grossly unremarkable.  Her venous duplex study is negative for DVT or Baker's cyst.  Unclear definitive etiology to patient's discomfort, however given pain started while wearing her daughter's compression socks which stopped at the midcalf feel this may be contributory.  Recommended continue Motrin/Tylenol and follow-up with her primary care provider. I discussed results, treatment plan, need for PCP follow-up, and return precautions with the patient. Provided opportunity for questions, patient confirmed understanding  and is in agreement with plan.    Final Clinical Impressions(s) / ED Diagnoses   Final diagnoses:  Acute pain of left knee    ED Discharge Orders    None       Cherly Anderson, PA-C 12/16/17 1813    Tilden Fossa, MD 12/17/17 [phone]

## 2017-12-16 NOTE — ED Notes (Signed)
Bed: WTR9 Expected date:  Expected time:  Means of arrival:  Comments: [facility]

## 2017-12-16 NOTE — ED Notes (Signed)
Bed: WTR9 Expected date:  Expected time:  Means of arrival:  Comments: [facility] 

## 2017-12-16 NOTE — Progress Notes (Signed)
*  Preliminary Results* Left lower extremity venous duplex completed. Left lower extremity is negative for deep vein thrombosis. There is no evidence of left Baker's cyst.  12/16/2017 5:57 PM  Gertie Fey, MHA, RVT, RDCS, RDMS

## 2017-12-17 ENCOUNTER — Other Ambulatory Visit: Payer: Self-pay | Admitting: Family Medicine

## 2018-01-06 ENCOUNTER — Encounter: Payer: Managed Care, Other (non HMO) | Admitting: Gynecology

## 2018-01-09 DIAGNOSIS — M25562 Pain in left knee: Secondary | ICD-10-CM | POA: Insufficient documentation

## 2018-01-09 DIAGNOSIS — M25561 Pain in right knee: Secondary | ICD-10-CM | POA: Insufficient documentation

## 2018-01-10 ENCOUNTER — Ambulatory Visit: Payer: Managed Care, Other (non HMO) | Attending: Nurse Practitioner | Admitting: Nurse Practitioner

## 2018-01-10 ENCOUNTER — Encounter: Payer: Self-pay | Admitting: Nurse Practitioner

## 2018-01-10 VITALS — BP 137/89 | HR 103 | Temp 98.1°F | Resp 16 | Ht 64.5 in | Wt 248.0 lb

## 2018-01-10 DIAGNOSIS — M5136 Other intervertebral disc degeneration, lumbar region: Secondary | ICD-10-CM | POA: Insufficient documentation

## 2018-01-10 DIAGNOSIS — G894 Chronic pain syndrome: Secondary | ICD-10-CM

## 2018-01-10 DIAGNOSIS — M4722 Other spondylosis with radiculopathy, cervical region: Secondary | ICD-10-CM | POA: Diagnosis not present

## 2018-01-10 DIAGNOSIS — M4802 Spinal stenosis, cervical region: Secondary | ICD-10-CM | POA: Insufficient documentation

## 2018-01-10 DIAGNOSIS — F429 Obsessive-compulsive disorder, unspecified: Secondary | ICD-10-CM | POA: Diagnosis not present

## 2018-01-10 DIAGNOSIS — M48061 Spinal stenosis, lumbar region without neurogenic claudication: Secondary | ICD-10-CM | POA: Insufficient documentation

## 2018-01-10 DIAGNOSIS — M47816 Spondylosis without myelopathy or radiculopathy, lumbar region: Secondary | ICD-10-CM | POA: Diagnosis not present

## 2018-01-10 DIAGNOSIS — Z885 Allergy status to narcotic agent status: Secondary | ICD-10-CM | POA: Insufficient documentation

## 2018-01-10 DIAGNOSIS — Z5181 Encounter for therapeutic drug level monitoring: Secondary | ICD-10-CM | POA: Diagnosis not present

## 2018-01-10 DIAGNOSIS — K219 Gastro-esophageal reflux disease without esophagitis: Secondary | ICD-10-CM | POA: Insufficient documentation

## 2018-01-10 DIAGNOSIS — Z79891 Long term (current) use of opiate analgesic: Secondary | ICD-10-CM | POA: Insufficient documentation

## 2018-01-10 DIAGNOSIS — M5030 Other cervical disc degeneration, unspecified cervical region: Secondary | ICD-10-CM | POA: Insufficient documentation

## 2018-01-10 DIAGNOSIS — M7918 Myalgia, other site: Secondary | ICD-10-CM | POA: Diagnosis not present

## 2018-01-10 DIAGNOSIS — Z79899 Other long term (current) drug therapy: Secondary | ICD-10-CM | POA: Insufficient documentation

## 2018-01-10 DIAGNOSIS — E119 Type 2 diabetes mellitus without complications: Secondary | ICD-10-CM | POA: Diagnosis not present

## 2018-01-10 DIAGNOSIS — F411 Generalized anxiety disorder: Secondary | ICD-10-CM | POA: Insufficient documentation

## 2018-01-10 DIAGNOSIS — F329 Major depressive disorder, single episode, unspecified: Secondary | ICD-10-CM | POA: Insufficient documentation

## 2018-01-10 DIAGNOSIS — G5603 Carpal tunnel syndrome, bilateral upper limbs: Secondary | ICD-10-CM | POA: Insufficient documentation

## 2018-01-10 DIAGNOSIS — Z8673 Personal history of transient ischemic attack (TIA), and cerebral infarction without residual deficits: Secondary | ICD-10-CM | POA: Diagnosis not present

## 2018-01-10 DIAGNOSIS — I10 Essential (primary) hypertension: Secondary | ICD-10-CM | POA: Diagnosis not present

## 2018-01-10 DIAGNOSIS — M47812 Spondylosis without myelopathy or radiculopathy, cervical region: Secondary | ICD-10-CM

## 2018-01-10 DIAGNOSIS — M5126 Other intervertebral disc displacement, lumbar region: Secondary | ICD-10-CM | POA: Insufficient documentation

## 2018-01-10 DIAGNOSIS — Z8249 Family history of ischemic heart disease and other diseases of the circulatory system: Secondary | ICD-10-CM | POA: Insufficient documentation

## 2018-01-10 DIAGNOSIS — Z791 Long term (current) use of non-steroidal anti-inflammatories (NSAID): Secondary | ICD-10-CM | POA: Insufficient documentation

## 2018-01-10 DIAGNOSIS — Z6841 Body Mass Index (BMI) 40.0 and over, adult: Secondary | ICD-10-CM | POA: Insufficient documentation

## 2018-01-10 DIAGNOSIS — Z7982 Long term (current) use of aspirin: Secondary | ICD-10-CM | POA: Insufficient documentation

## 2018-01-10 DIAGNOSIS — Z880 Allergy status to penicillin: Secondary | ICD-10-CM | POA: Insufficient documentation

## 2018-01-10 DIAGNOSIS — Z9049 Acquired absence of other specified parts of digestive tract: Secondary | ICD-10-CM | POA: Insufficient documentation

## 2018-01-10 DIAGNOSIS — Z7984 Long term (current) use of oral hypoglycemic drugs: Secondary | ICD-10-CM | POA: Insufficient documentation

## 2018-01-10 DIAGNOSIS — M62838 Other muscle spasm: Secondary | ICD-10-CM | POA: Diagnosis not present

## 2018-01-10 DIAGNOSIS — M25561 Pain in right knee: Secondary | ICD-10-CM | POA: Diagnosis not present

## 2018-01-10 DIAGNOSIS — Z833 Family history of diabetes mellitus: Secondary | ICD-10-CM | POA: Insufficient documentation

## 2018-01-10 DIAGNOSIS — Z882 Allergy status to sulfonamides status: Secondary | ICD-10-CM | POA: Insufficient documentation

## 2018-01-10 MED ORDER — HYDROCODONE-ACETAMINOPHEN 5-325 MG PO TABS: 0.5000 | ORAL_TABLET | Freq: Two times a day (BID) | ORAL | 0 refills | Status: DC | PRN

## 2018-01-10 MED ORDER — METAXALONE 800 MG PO TABS: 800.0000 mg | ORAL_TABLET | Freq: Three times a day (TID) | ORAL | 2 refills | Status: DC | PRN

## 2018-01-10 MED ORDER — IBUPROFEN 800 MG PO TABS: 800.0000 mg | ORAL_TABLET | Freq: Three times a day (TID) | ORAL | 0 refills | Status: AC | PRN

## 2018-01-10 NOTE — Progress Notes (Signed)
Patient's Name: Debra Myers  MRN: [id number]  Referring Provider: Elby Beck, FNP  DOB: [birth year]  PCP: Elby Beck, FNP  DOS: 01/10/2018  Note by: Vevelyn Francois NP  Service setting: Ambulatory outpatient  Specialty: Interventional Pain Management  Location: [facility] (AMB) Pain Management Facility    Patient type: Established    Primary Reason(s) for Visit: Encounter for prescription drug management. (Level of risk: moderate)  CC: Back Pain (lower)  HPI  Debra Myers is a 52 y.o. year old, female patient, who comes today for a medication management evaluation. She has GOITER, NONTOXIC MULTINODULAR; Diabetes ([place]); Polycystic ovaries; HYPERTRIGLYCERIDEMIA; Metabolic Syndrome X; OBESITY; Anxiety; Allergic rhinitis; Asthma; Hirsutism; Generalized anxiety disorder; Hypertension; Overweight(278.02); GERD (gastroesophageal reflux disease); IUD (intrauterine device) in place; Opiate use (10 MME/Day); Long term prescription opiate use; Long term current use of opiate analgesic; Encounter for therapeutic drug level monitoring; Encounter for chronic pain management; Numbness of upper extremity; Radiculitis involving upper extremity; Chronic low back pain (Location of Primary Source of Pain) (Bilateral) (L>R); Lumbar spondylosis (Bulging Disc & Severe Left Foraminal Stenosis at L3-4); Chronic lower extremity pain (Location of Secondary source of pain) (Left); Lumbar foraminal stenosis (Severe Left L3-4); Lumbar facet syndrome (Location of Primary Source of Pain) (Bilateral) (L>R); Cervical spondylosis (C5-6 & C6-7 DDD); Cervical (3 mm) Grade 1 Anterolisthesis of C4 over C5; Chronic neck pain (Location of Tertiary source of pain) (Bilateral) (L>R); Chronic cervical radicular pain (Bilateral) (L>R); Cervical foraminal stenosis (multilevel) (Bilateral); TIA (transient ischemic attack); Muscle spasm, nocturnal; Musculoskeletal pain; Carpal tunnel syndrome (Bilateral) (L>R); Cervical facet syndrome (Location  of Tertiary source of pain) (Bilateral) (L>R); Chronic pain syndrome; Morbid obesity with BMI of 40.0-44.9, adult ([place]); Well woman exam; DDD (degenerative disc disease), lumbar; DDD (degenerative disc disease), cervical; and Pain in right knee on their problem list. Her primarily concern today is the Back Pain (lower)  Pain Assessment: Location: Lower, Mid Back Radiating: hips/ bilateral down side of leg knees Onset: More than a month ago Duration: Chronic pain Quality: Aching, Burning, Constant, Discomfort Severity: 2 /10 (subjective, self-reported pain score)  Note: Reported level is compatible with observation.                          Effect on ADL: prolonged walking and standing Timing: Constant Modifying factors: medications BP: 137/89  HR: (!) 103  Debra Myers was last scheduled for an appointment on 10/18/2017 for medication management. During today's appointment we reviewed Debra Myers's chronic pain status, as well as her outpatient medication regimen.  Like her low back pain radiates up into her middle back.  She was having  left knee pain. She has been evaluated at [facility].  She does continue to see a chiropractor for her left hip pain.  She admits that now that she has had her knees evaluated she will return to the gym.  The patient  reports that she does not use drugs. Her body mass index is 41.91 kg/m.  Further details on both, my assessment(s), as well as the proposed treatment plan, please see below.  Controlled Substance Pharmacotherapy Assessment REMS (Risk Evaluation and Mitigation Strategy)  Analgesic:Hydrocodone/APAP 5/325 2 tablets per day (10 mg/day) MME/day:10 mg/day  Ignatius Specking, RN  01/10/2018 11:08 AM  Sign at close encounter Nursing Pain Medication Assessment:  Safety precautions to be maintained throughout the outpatient stay will include: orient to surroundings, keep bed in low position, maintain call bell within reach  at all times, provide  assistance with transfer out of bed and ambulation.  Medication Inspection Compliance: Pill count conducted under aseptic conditions, in front of the patient. Neither the pills nor the bottle was removed from the patient's sight at any time. Once count was completed pills were immediately returned to the patient in their original bottle.  Medication: Hydrocodone/APAP Pill/Patch Count: 49 of 60 pills remain Pill/Patch Appearance: Markings consistent with prescribed medication Bottle Appearance: Standard pharmacy container. Clearly labeled. Filled Date: 11 / 11 / 2019 Last Medication intake:  Today   Pharmacokinetics: Liberation and absorption (onset of action): WNL Distribution (time to peak effect): WNL Metabolism and excretion (duration of action): WNL         Pharmacodynamics: Desired effects: Analgesia: Ms. Better reports >50% benefit. Functional ability: Patient reports that medication allows her to accomplish basic ADLs Clinically meaningful improvement in function (CMIF): Sustained CMIF goals met Perceived effectiveness: Described as relatively effective, allowing for increase in activities of daily living (ADL) Undesirable effects: Side-effects or Adverse reactions: None reported Monitoring: [place] PMP: Online review of the past 72-monthperiod conducted. Compliant with practice rules and regulations Last UDS on record: Summary  Date Value Ref Range Status  07/19/2017 FINAL  Final    Comment:    ==================================================================== TOXASSURE SELECT 13 (MW) ==================================================================== Test                             Result       Flag       Units Drug Present and Declared for Prescription Verification   Amphetamine                    1050         EXPECTED   ng/mg creat    Amphetamine is available as a schedule II prescription drug.   Lorazepam                      252          EXPECTED   ng/mg creat     Source of lorazepam is a scheduled prescription medication.   Hydrocodone                    316          EXPECTED   ng/mg creat   Dihydrocodeine                 55           EXPECTED   ng/mg creat   Norhydrocodone                 359          EXPECTED   ng/mg creat    Sources of hydrocodone include scheduled prescription    medications. Dihydrocodeine and norhydrocodone are expected    metabolites of hydrocodone. Dihydrocodeine is also available as a    scheduled prescription medication. ==================================================================== Test                      Result    Flag   Units      Ref Range   Creatinine              105              mg/dL      >=20 ==================================================================== Declared Medications:  The flagging and interpretation  on this report are based on the  following declared medications.  Unexpected results may arise from  inaccuracies in the declared medications.  **Note: The testing scope of this panel includes these medications:  Amphetamine (Lisdexamfetamine)  Hydrocodone (Hydrocodone-Acetaminophen)  Lorazepam  **Note: The testing scope of this panel does not include following  reported medications:  Acetaminophen (Hydrocodone-Acetaminophen)  Albuterol  Aspirin (Aspirin 81)  Atorvastatin  Bupropion  Cetirizine  Fluticasone  Ibuprofen  Lamotrigine  Metaxalone  Metformin  Montelukast  Paroxetine  Spironolactone ==================================================================== For clinical consultation, please call [phone]. ====================================================================    UDS interpretation: Compliant          Medication Assessment Form: Reviewed. Patient indicates being compliant with therapy Treatment compliance: Compliant Risk Assessment Profile: Aberrant behavior: See prior evaluations. None observed or detected today Comorbid factors increasing risk of  overdose: See prior notes. No additional risks detected today Opioid risk tool (ORT) (Total Score): 3 Personal History of Substance Abuse (SUD-Substance use disorder):  Alcohol: Negative  Illegal Drugs: Negative  Rx Drugs: Negative  ORT Risk Level calculation: Low Risk Risk of substance use disorder (SUD): Low Opioid Risk Tool - 01/10/18 1104      Personal History of Substance Abuse   Alcohol  Negative    Illegal Drugs  Negative    Rx Drugs  Negative      Age   Age between 43-45 years   No      History of Preadolescent Sexual Abuse   History of Preadolescent Sexual Abuse  Negative or Female      Psychological Disease   Psychological Disease  Positive    ADD  Negative    OCD  Negative    Bipolar  Negative    Schizophrenia  Negative    Depression  Positive      Total Score   Opioid Risk Tool Scoring  3    Opioid Risk Interpretation  Low Risk      ORT Scoring interpretation table:  Score <3 = Low Risk for SUD  Score between 4-7 = Moderate Risk for SUD  Score >8 = High Risk for Opioid Abuse   Risk Mitigation Strategies:  Patient Counseling: Covered Patient-Prescriber Agreement (PPA): Present and active  Notification to other healthcare providers: Done  Pharmacologic Plan: No change in therapy, at this time.             Laboratory Chemistry  Inflammation Markers (CRP: Acute Phase) (ESR: Chronic Phase) Lab Results  Component Value Date   ESRSEDRATE 32 05/26/2017                         Rheumatology Markers Lab Results  Component Value Date   LABURIC 4.0 10/31/2015                        Renal Function Markers Lab Results  Component Value Date   BUN 18 12/16/2017   CREATININE 0.94 12/16/2017   BCR 15 05/26/2017   GFRAA >60 12/16/2017   GFRNONAA >60 12/16/2017                             Hepatic Function Markers Lab Results  Component Value Date   AST 16 05/26/2017   ALT 14 05/26/2017   ALBUMIN 4.1 05/26/2017   ALKPHOS 104 05/26/2017   HCVAB NEGATIVE  05/18/2013  Electrolytes Lab Results  Component Value Date   NA 138 12/16/2017   K 3.9 12/16/2017   CL 108 12/16/2017   CALCIUM 8.7 (L) 12/16/2017   PHOS 4.0 10/31/2015                        Neuropathy Markers Lab Results  Component Value Date   HGBA1C 5.9 06/12/2015   HIV NON REACTIVE 05/18/2013                        CNS Tests No results found for: COLORCSF, APPEARCSF, RBCCOUNTCSF, WBCCSF, POLYSCSF, LYMPHSCSF, EOSCSF, PROTEINCSF, GLUCCSF, JCVIRUS, CSFOLI, IGGCSF                      Bone Pathology Markers Lab Results  Component Value Date   VD25OH 20.9 (L) 05/26/2017                         Coagulation Parameters Lab Results  Component Value Date   INR 1.10 06/11/2015   LABPROT 14.4 06/11/2015   APTT 33 06/11/2015   PLT 256 12/16/2017                        Cardiovascular Markers Lab Results  Component Value Date   TROPONINI <0.03 06/11/2015   HGB 12.1 12/16/2017   HCT 38.5 12/16/2017                         CA Markers Lab Results  Component Value Date   CA125 14 07/11/2015                        Note: Lab results reviewed.  Recent Diagnostic Imaging Results  VAS [name] LOWER EXTREMITY VENOUS (DVT) ([facility] and [facility] 7a-7p)  Lower Venous Study  Indications: Pain.   Performing Technologist: Maudry Mayhew MHA, RDMS, RVT, RDCS    Examination Guidelines: A complete evaluation includes B-mode imaging, spectral Doppler, color Doppler, and power Doppler as needed of all accessible portions of each vessel. Bilateral testing is considered an integral part of a complete examination. Limited examinations for reoccurring indications may be performed as noted.    Right Venous Findings: +---+---------------+---------+-----------+----------+-------+    CompressibilityPhasicitySpontaneityPropertiesSummary +---+---------------+---------+-----------+----------+-------+ CFVFull           Yes      Yes                           +---+---------------+---------+-----------+----------+-------+     Left Venous Findings: +---------+---------------+---------+-----------+----------+-------+          CompressibilityPhasicitySpontaneityPropertiesSummary +---------+---------------+---------+-----------+----------+-------+ CFV      Full           Yes      Yes                          +---------+---------------+---------+-----------+----------+-------+ SFJ      Full                                                 +---------+---------------+---------+-----------+----------+-------+ FV Prox  Full                                                 +---------+---------------+---------+-----------+----------+-------+  FV Mid   Full                                                 +---------+---------------+---------+-----------+----------+-------+ FV DistalFull                                                 +---------+---------------+---------+-----------+----------+-------+ PFV      Full                                                 +---------+---------------+---------+-----------+----------+-------+ POP      Full           Yes      Yes                          +---------+---------------+---------+-----------+----------+-------+ PTV      Full                                                 +---------+---------------+---------+-----------+----------+-------+ PERO     Full                                                 +---------+---------------+---------+-----------+----------+-------+          Summary: Right: No evidence of common femoral vein obstruction. Left: There is no evidence of deep vein thrombosis in the lower extremity. No cystic structure found in the popliteal fossa.   *See table(s) above for measurements and observations.  Electronically signed by Monica Martinez MD on 12/16/2017 at 6:04:46 PM.      Final    Complexity Note:  Imaging results reviewed. Results shared with Debra Myers, using Layman's terms.                         Meds   Current Outpatient Medications:  .  albuterol (PROVENTIL HFA;VENTOLIN HFA) 108 (90 Base) MCG/ACT inhaler, Inhale 2 puffs into the lungs every 6 (six) hours as needed for wheezing or shortness of breath., Disp: 1 Inhaler, Rfl: 5 .  atorvastatin (LIPITOR) 10 MG tablet, Take 1 tablet (10 mg total) by mouth daily., Disp: 90 tablet, Rfl: 3 .  buPROPion (WELLBUTRIN XL) 300 MG 24 hr tablet, Take 300 mg by mouth daily., Disp: , Rfl:  .  cetirizine (ZYRTEC) 10 MG tablet, Take 10 mg by mouth at bedtime., Disp: , Rfl:  .  Cholecalciferol (VITAMIN D3) 50000 units TABS, Take 1 tablet by mouth every 7 (seven) days., Disp: 12 tablet, Rfl: 3 .  fluticasone (FLONASE) 50 MCG/ACT nasal spray, Place 2 sprays into both nostrils daily as needed for rhinitis., Disp: 16 g, Rfl: 1 .  [START ON 04/02/2018] HYDROcodone-acetaminophen (NORCO/VICODIN) 5-325 MG tablet, Take 0.5-1 tablets by mouth 2 (two) times daily as needed for severe pain., Disp:  60 tablet, Rfl: 0 .  lamoTRIgine (LAMICTAL) 200 MG tablet, Take 300 mg by mouth at bedtime. , Disp: , Rfl:  .  LORazepam (ATIVAN) 1 MG tablet, Take 0.5-1 mg by mouth every 8 (eight) hours as needed for anxiety or sleep. , Disp: , Rfl:  .  [START ON 01/16/2018] metaxalone (SKELAXIN) 800 MG tablet, Take 1 tablet (800 mg total) by mouth 3 (three) times daily as needed for muscle spasms., Disp: 90 tablet, Rfl: 2 .  metFORMIN (GLUCOPHAGE) 500 MG tablet, Take 1 tablet (500 mg total) by mouth daily with breakfast., Disp: 90 tablet, Rfl: 1 .  montelukast (SINGULAIR) 10 MG tablet, TAKE 1 TABLET BY MOUTH EVERY NIGHT AT BEDTIME, Disp: 90 tablet, Rfl: 3 .  PARoxetine (PAXIL-CR) 25 MG 24 hr tablet, Take 50 mg by mouth at bedtime. , Disp: , Rfl:  .  RA ASPIRIN EC ADULT LOW ST 81 MG EC tablet, take 1 tablet by mouth once daily, Disp: 30 tablet, Rfl: 0 .  spironolactone (ALDACTONE) 25 MG  tablet, TAKE 1 TABLET EVERY DAY, Disp: 90 tablet, Rfl: 0 .  VYVANSE 40 MG capsule, Take 40 mg by mouth daily., Disp: , Rfl: 0 .  [START ON 03/03/2018] HYDROcodone-acetaminophen (NORCO/VICODIN) 5-325 MG tablet, Take 0.5-1 tablets by mouth 2 (two) times daily as needed for severe pain., Disp: 60 tablet, Rfl: 0 .  [START ON 02/01/2018] HYDROcodone-acetaminophen (NORCO/VICODIN) 5-325 MG tablet, Take 0.5-1 tablets by mouth 2 (two) times daily as needed for severe pain., Disp: 60 tablet, Rfl: 0 .  ibuprofen (ADVIL,MOTRIN) 800 MG tablet, Take 1 tablet (800 mg total) by mouth every 8 (eight) hours as needed for moderate pain., Disp: 90 tablet, Rfl: 0  ROS  Constitutional: Denies any fever or chills Gastrointestinal: No reported hemesis, hematochezia, vomiting, or acute GI distress Musculoskeletal: Denies any acute onset joint swelling, redness, loss of ROM, or weakness Neurological: No reported episodes of acute onset apraxia, aphasia, dysarthria, agnosia, amnesia, paralysis, loss of coordination, or loss of consciousness  Allergies  Debra Myers is allergic to oxycodone; penicillins; red dye; and sulfa antibiotics.  Debra Myers  Drug: Debra Myers  reports that she does not use drugs. Alcohol:  reports that she drinks alcohol. Tobacco:  reports that she has never smoked. She has never used smokeless tobacco. Medical:  has a past medical history of Anxiety, Asthma, Bulging lumbar disc (L3-4) (02/26/2015), Degenerative disc disease, Degenerative lumbar disc, Depression, Diabetes mellitus, Elective abortion, GERD (gastroesophageal reflux disease), Hypertension, Stroke ([place]) (06/11/2015), and Vaginal delivery. Surgical: Debra Myers  has a past surgical history that includes Cholecystectomy (2001); Lapband (04/2012); Wisdom tooth extraction; Dilatation & curettage/hysteroscopy with myosure (N/A, 09/10/2014); mirena; and lap band surgery. Family: family history includes Asthma in her maternal grandmother; Diabetes in her  father; Hypertension in her father and mother; Stroke in her maternal grandfather and maternal grandmother.  Constitutional Exam  General appearance: Well nourished, well developed, and well hydrated. In no apparent acute distress Vitals:   01/10/18 1054  BP: 137/89  Pulse: (!) 103  Resp: 16  Temp: 98.1 F (36.7 C)  SpO2: 99%  Weight: 248 lb (112.5 kg)  Height: 5' 4.5" (1.638 m)  Psych/Mental status: Alert, oriented x 3 (person, place, & time)       Eyes: PERLA Respiratory: No evidence of acute respiratory distress  Lumbar Spine Area Exam  Skin & Axial Inspection: No masses, redness, or swelling Alignment: Symmetrical Functional ROM: Unrestricted ROM       Stability: No instability detected  Muscle Tone/Strength: Functionally intact. No obvious neuro-muscular anomalies detected. Sensory (Neurological): Unimpaired Palpation: No palpable anomalies         Gait & Posture Assessment  Ambulation: Unassisted Gait: Relatively normal for age and body habitus Posture: WNL   Lower Extremity Exam    Side: Right lower extremity  Side: Left lower extremity  Stability: No instability observed          Stability: No instability observed          Skin & Extremity Inspection: Edema  Skin & Extremity Inspection: Skin color, temperature, and hair growth are WNL. No peripheral edema or cyanosis. No masses, redness, swelling, asymmetry, or associated skin lesions. No contractures.  Functional ROM: Unrestricted ROM                  Functional ROM: Unrestricted ROM                  Muscle Tone/Strength: Functionally intact. No obvious neuro-muscular anomalies detected.  Muscle Tone/Strength: Functionally intact. No obvious neuro-muscular anomalies detected.   Assessment  Primary Diagnosis & Pertinent Problem List: The primary encounter diagnosis was Lumbar facet syndrome (Location of Primary Source of Pain) (Bilateral) (L>R). Diagnoses of Cervical spondylosis (C5-6 & C6-7 DDD), Muscle spasm,  nocturnal, Musculoskeletal pain, and Chronic pain syndrome were also pertinent to this visit.  Status Diagnosis  Controlled Controlled Controlled 1. Lumbar facet syndrome (Location of Primary Source of Pain) (Bilateral) (L>R)   2. Cervical spondylosis (C5-6 & C6-7 DDD)   3. Muscle spasm, nocturnal   4. Musculoskeletal pain   5. Chronic pain syndrome     Problems updated and reviewed during this visit: Problem  Pain in Right Knee   Plan of Care  Pharmacotherapy (Medications Ordered): Meds ordered this encounter  Medications  . HYDROcodone-acetaminophen (NORCO/VICODIN) 5-325 MG tablet    Sig: Take 0.5-1 tablets by mouth 2 (two) times daily as needed for severe pain.    Dispense:  60 tablet    Refill:  0    Do not place this medication, or any other prescription from our practice, on "Automatic Refill". Patient may have prescription filled one day early if pharmacy is closed on scheduled refill date.    Order Specific Question:   Supervising Provider    Answer:   Milinda Pointer [phone]  . HYDROcodone-acetaminophen (NORCO/VICODIN) 5-325 MG tablet    Sig: Take 0.5-1 tablets by mouth 2 (two) times daily as needed for severe pain.    Dispense:  60 tablet    Refill:  0    Do not place this medication, or any other prescription from our practice, on "Automatic Refill". Patient may have prescription filled one day early if pharmacy is closed on scheduled refill date.    Order Specific Question:   Supervising Provider    Answer:   Milinda Pointer [phone]  . HYDROcodone-acetaminophen (NORCO/VICODIN) 5-325 MG tablet    Sig: Take 0.5-1 tablets by mouth 2 (two) times daily as needed for severe pain.    Dispense:  60 tablet    Refill:  0    Do not place this medication, or any other prescription from our practice, on "Automatic Refill". Patient may have prescription filled one day early if pharmacy is closed on scheduled refill date.    Order Specific Question:   Supervising Provider     Answer:   Milinda Pointer [phone]  . metaxalone (SKELAXIN) 800 MG tablet    Sig: Take 1 tablet (800 mg total) by  mouth 3 (three) times daily as needed for muscle spasms.    Dispense:  90 tablet    Refill:  2    Do not add this medication to the electronic "Automatic Refill" notification system. Patient may have prescription filled one day early if pharmacy is closed on scheduled refill date.    Order Specific Question:   Supervising Provider    Answer:   Milinda Pointer [phone]  . ibuprofen (ADVIL,MOTRIN) 800 MG tablet    Sig: Take 1 tablet (800 mg total) by mouth every 8 (eight) hours as needed for moderate pain.    Dispense:  90 tablet    Refill:  0    Do not add this medication to the electronic "Automatic Refill" notification system. Patient may have prescription filled one day early if pharmacy is closed on scheduled refill date.    Order Specific Question:   Supervising Provider    Answer:   Milinda Pointer [[id number]]   New Prescriptions   No medications on file   Medications administered today: Debra Myers had no medications administered during this visit. Lab-work, procedure(s), and/or referral(s): No orders of the defined types were placed in this encounter.  Imaging and/or referral(s): None  Interventional therapies: Planned, scheduled, and/or pending:  None at this time    Considering:  Diagnostic left L3-4 lumbar epidural steroid injection  Diagnostic bilateral lumbar facet block  Possible bilateral lumbar facet radiofrequency ablation.  Diagnostic left-sided cervical epidural steroid injection  Diagnostic bilateral cervical facet block  Possible bilateral cervical facet radiofrequency ablation.    Palliative PRN treatment(s):  Diagnostic left L3-4 lumbar epidural steroid injection  Diagnostic bilateral lumbar facet block  Diagnostic left-sided cervical epidural steroid injection  Diagnostic bilateral cervical facet block     Provider-requested follow-up: Return in about 3 months (around 04/12/2018) for MedMgmt.  Future Appointments  Date Time Provider [place]  03/01/2018 12:00 PM Fontaine, Belinda Block, MD [facility]  04/05/2018  8:30 AM Elby Beck, FNP [facility]  04/18/2018 10:30 AM Vevelyn Francois, NP [facility] None   Primary Care Physician: Elby Beck, FNP Location: [facility] Management Facility Note by: Vevelyn Francois NP Date: 01/10/2018; Time: 12:21 PM  Pain Score Disclaimer: We use the NRS-11 scale. This is a self-reported, subjective measurement of pain severity with only modest accuracy. It is used primarily to identify changes within a particular patient. It must be understood that outpatient pain scales are significantly less accurate that those used for research, where they can be applied under ideal controlled circumstances with minimal exposure to variables. In reality, the score is likely to be a combination of pain intensity and pain affect, where pain affect describes the degree of emotional arousal or changes in action readiness caused by the sensory experience of pain. Factors such as social and work situation, setting, emotional state, anxiety levels, expectation, and prior pain experience may influence pain perception and show large inter-individual differences that may also be affected by time variables.  Patient instructions provided during this appointment: Patient Instructions   ____________________________________________________________________________________________  Medication Rules  Applies to: All patients receiving prescriptions (written or electronic).  Pharmacy of record: Pharmacy where electronic prescriptions will be sent. If written prescriptions are taken to a different pharmacy, please inform the nursing staff. The pharmacy listed in the electronic medical record should be the one where you would like electronic prescriptions to be  sent.  Prescription refills: Only during scheduled appointments. Applies to both, written and electronic  prescriptions.  NOTE: The following applies primarily to controlled substances (Opioid* Pain Medications).   Patient's responsibilities: 1. Pain Pills: Bring all pain pills to every appointment (except for procedure appointments). 2. Pill Bottles: Bring pills in original pharmacy bottle. Always bring newest bottle. Bring bottle, even if empty. 3. Medication refills: You are responsible for knowing and keeping track of what medications you need refilled. The day before your appointment, write a list of all prescriptions that need to be refilled. Bring that list to your appointment and give it to the admitting nurse. Prescriptions will be written only during appointments. If you forget a medication, it will not be "Called in", "Faxed", or "electronically sent". You will need to get another appointment to get these prescribed. 4. Prescription Accuracy: You are responsible for carefully inspecting your prescriptions before leaving our office. Have the discharge nurse carefully go over each prescription with you, before taking them home. Make sure that your name is accurately spelled, that your address is correct. Check the name and dose of your medication to make sure it is accurate. Check the number of pills, and the written instructions to make sure they are clear and accurate. Make sure that you are given enough medication to last until your next medication refill appointment. 5. Taking Medication: Take medication as prescribed. Never take more pills than instructed. Never take medication more frequently than prescribed. Taking less pills or less frequently is permitted and encouraged, when it comes to controlled substances (written prescriptions).  6. Inform other Doctors: Always inform, all of your healthcare providers, of all the medications you take. 7. Pain Medication from other Providers: You  are not allowed to accept any additional pain medication from any other Doctor or Healthcare provider. There are two exceptions to this rule. (see below) In the event that you require additional pain medication, you are responsible for notifying us, as stated below. 8. Medication Agreement: You are responsible for carefully reading and following our Medication Agreement. This must be signed before receiving any prescriptions from our practice. Safely store a copy of your signed Agreement. Violations to the Agreement will result in no further prescriptions. (Additional copies of our Medication Agreement are available upon request.) 9. Laws, Rules, & Regulations: All patients are expected to follow all Federal and [employer], [employer], Rules, [employer]. Ignorance of the Laws does not constitute a valid excuse. The use of any illegal substances is prohibited. 10. Adopted CDC guidelines & recommendations: Target dosing levels will be at or below 60 MME/day. Use of benzodiazepines** is not recommended.  Exceptions: There are only two exceptions to the rule of not receiving pain medications from other Healthcare Providers. 1. Exception #1 (Emergencies): In the event of an emergency (i.e.: accident requiring emergency care), you are allowed to receive additional pain medication. However, you are responsible for: As soon as you are able, call our office [phone], at any time of the day or night, and leave a message stating your name, the date and nature of the emergency, and the name and dose of the medication prescribed. In the event that your call is answered by a member of our staff, make sure to document and save the date, time, and the name of the person that took your information.  2. Exception #2 (Planned Surgery): In the event that you are scheduled by another doctor or dentist to have any type of surgery or procedure, you are allowed (for a period no longer than 30 days), to receive additional  pain  medication, for the acute post-op pain. However, in this case, you are responsible for picking up a copy of our "Post-op Pain Management for Surgeons" handout, and giving it to your surgeon or dentist. This document is available at our office, and does not require an appointment to obtain it. Simply go to our office during business hours (Monday-Thursday from 8:00 AM to 4:00 PM) (Friday 8:00 AM to 12:00 Noon) or if you have a scheduled appointment with [name], prior to your surgery, and ask for it by name. In addition, you will need to provide [name] with your name, name of your surgeon, type of surgery, and date of procedure or surgery.  *Opioid medications include: morphine, codeine, oxycodone, oxymorphone, hydrocodone, hydromorphone, meperidine, tramadol, tapentadol, buprenorphine, fentanyl, methadone. **Benzodiazepine medications include: diazepam (Valium), alprazolam (Xanax), clonazepam (Klonopine), lorazepam (Ativan), clorazepate (Tranxene), chlordiazepoxide (Librium), estazolam (Prosom), oxazepam (Serax), temazepam (Restoril), triazolam (Halcion) (Last updated: 04/21/2017) ____________________________________________________________________________________________   BMI Assessment: Estimated body mass index is 41.91 kg/m as calculated from the following:   Height as of this encounter: 5' 4.5" (1.638 m).   Weight as of this encounter: 248 lb (112.5 kg).  BMI interpretation table: BMI level Category Range association with higher incidence of chronic pain  <18 kg/m2 Underweight   18.5-24.9 kg/m2 Ideal body weight   25-29.9 kg/m2 Overweight Increased incidence by 20%  30-34.9 kg/m2 Obese (Class I) Increased incidence by 68%  35-39.9 kg/m2 Severe obesity (Class II) Increased incidence by 136%  >40 kg/m2 Extreme obesity (Class III) Increased incidence by 254%   Patient's current BMI Ideal Body weight  Body mass index is 41.91 kg/m. Ideal body weight: 55.8 kg (123 lb 2 oz) Adjusted ideal body  weight: 78.5 kg (173 lb 1.2 oz)   BMI Readings from Last 4 Encounters:  01/10/18 41.91 kg/m  12/16/17 44.29 kg/m  10/18/17 41.40 kg/m  10/03/17 42.31 kg/m   Wt Readings from Last 4 Encounters:  01/10/18 248 lb (112.5 kg)  12/16/17 258 lb (117 kg)  10/18/17 245 lb (111.1 kg)  10/03/17 246 lb 8 oz (111.8 kg)

## 2018-01-10 NOTE — Patient Instructions (Addendum)
____________________________________________________________________________________________  Medication Rules  Applies to: All patients receiving prescriptions (written or electronic).  Pharmacy of record: Pharmacy where electronic prescriptions will be sent. If written prescriptions are taken to a different pharmacy, please inform the nursing staff. The pharmacy listed in the electronic medical record should be the one where you would like electronic prescriptions to be sent.  Prescription refills: Only during scheduled appointments. Applies to both, written and electronic prescriptions.  NOTE: The following applies primarily to controlled substances (Opioid* Pain Medications).   Patient's responsibilities: 1. Pain Pills: Bring all pain pills to every appointment (except for procedure appointments). 2. Pill Bottles: Bring pills in original pharmacy bottle. Always bring newest bottle. Bring bottle, even if empty. 3. Medication refills: You are responsible for knowing and keeping track of what medications you need refilled. The day before your appointment, write a list of all prescriptions that need to be refilled. Bring that list to your appointment and give it to the admitting nurse. Prescriptions will be written only during appointments. If you forget a medication, it will not be "Called in", "Faxed", or "electronically sent". You will need to get another appointment to get these prescribed. 4. Prescription Accuracy: You are responsible for carefully inspecting your prescriptions before leaving our office. Have the discharge nurse carefully go over each prescription with you, before taking them home. Make sure that your name is accurately spelled, that your address is correct. Check the name and dose of your medication to make sure it is accurate. Check the number of pills, and the written instructions to make sure they are clear and accurate. Make sure that you are given enough medication to last  until your next medication refill appointment. 5. Taking Medication: Take medication as prescribed. Never take more pills than instructed. Never take medication more frequently than prescribed. Taking less pills or less frequently is permitted and encouraged, when it comes to controlled substances (written prescriptions).  6. Inform other Doctors: Always inform, all of your healthcare providers, of all the medications you take. 7. Pain Medication from other Providers: You are not allowed to accept any additional pain medication from any other Doctor or Healthcare provider. There are two exceptions to this rule. (see below) In the event that you require additional pain medication, you are responsible for notifying us, as stated below. 8. Medication Agreement: You are responsible for carefully reading and following our Medication Agreement. This must be signed before receiving any prescriptions from our practice. Safely store a copy of your signed Agreement. Violations to the Agreement will result in no further prescriptions. (Additional copies of our Medication Agreement are available upon request.) 9. Laws, Rules, & Regulations: All patients are expected to follow all Federal and State Laws, Statutes, Rules, & Regulations. Ignorance of the Laws does not constitute a valid excuse. The use of any illegal substances is prohibited. 10. Adopted CDC guidelines & recommendations: Target dosing levels will be at or below 60 MME/day. Use of benzodiazepines** is not recommended.  Exceptions: There are only two exceptions to the rule of not receiving pain medications from other Healthcare Providers. 1. Exception #1 (Emergencies): In the event of an emergency (i.e.: accident requiring emergency care), you are allowed to receive additional pain medication. However, you are responsible for: As soon as you are able, call our office [phone], at any time of the day or night, and leave a message stating your name, the  date and nature of the emergency, and the name and dose of the medication   prescribed. In the event that your call is answered by a member of our staff, make sure to document and save the date, time, and the name of the person that took your information.  2. Exception #2 (Planned Surgery): In the event that you are scheduled by another doctor or dentist to have any type of surgery or procedure, you are allowed (for a period no longer than 30 days), to receive additional pain medication, for the acute post-op pain. However, in this case, you are responsible for picking up a copy of our "Post-op Pain Management for Surgeons" handout, and giving it to your surgeon or dentist. This document is available at our office, and does not require an appointment to obtain it. Simply go to our office during business hours (Monday-Thursday from 8:00 AM to 4:00 PM) (Friday 8:00 AM to 12:00 Noon) or if you have a scheduled appointment with us, prior to your surgery, and ask for it by name. In addition, you will need to provide us with your name, name of your surgeon, type of surgery, and date of procedure or surgery.  *Opioid medications include: morphine, codeine, oxycodone, oxymorphone, hydrocodone, hydromorphone, meperidine, tramadol, tapentadol, buprenorphine, fentanyl, methadone. **Benzodiazepine medications include: diazepam (Valium), alprazolam (Xanax), clonazepam (Klonopine), lorazepam (Ativan), clorazepate (Tranxene), chlordiazepoxide (Librium), estazolam (Prosom), oxazepam (Serax), temazepam (Restoril), triazolam (Halcion) (Last updated: 04/21/2017) ____________________________________________________________________________________________   BMI Assessment: Estimated body mass index is 41.91 kg/m as calculated from the following:   Height as of this encounter: 5' 4.5" (1.638 m).   Weight as of this encounter: 248 lb (112.5 kg).  BMI interpretation table: BMI level Category Range association with higher  incidence of chronic pain  <18 kg/m2 Underweight   18.5-24.9 kg/m2 Ideal body weight   25-29.9 kg/m2 Overweight Increased incidence by 20%  30-34.9 kg/m2 Obese (Class I) Increased incidence by 68%  35-39.9 kg/m2 Severe obesity (Class II) Increased incidence by 136%  >40 kg/m2 Extreme obesity (Class III) Increased incidence by 254%   Patient's current BMI Ideal Body weight  Body mass index is 41.91 kg/m. Ideal body weight: 55.8 kg (123 lb 2 oz) Adjusted ideal body weight: 78.5 kg (173 lb 1.2 oz)   BMI Readings from Last 4 Encounters:  01/10/18 41.91 kg/m  12/16/17 44.29 kg/m  10/18/17 41.40 kg/m  10/03/17 42.31 kg/m   Wt Readings from Last 4 Encounters:  01/10/18 248 lb (112.5 kg)  12/16/17 258 lb (117 kg)  10/18/17 245 lb (111.1 kg)  10/03/17 246 lb 8 oz (111.8 kg)

## 2018-01-10 NOTE — Progress Notes (Signed)
Nursing Pain Medication Assessment:  Safety precautions to be maintained throughout the outpatient stay will include: orient to surroundings, keep bed in low position, maintain call bell within reach at all times, provide assistance with transfer out of bed and ambulation.  Medication Inspection Compliance: Pill count conducted under aseptic conditions, in front of the patient. Neither the pills nor the bottle was removed from the patient's sight at any time. Once count was completed pills were immediately returned to the patient in their original bottle.  Medication: Hydrocodone/APAP Pill/Patch Count: 49 of 60 pills remain Pill/Patch Appearance: Markings consistent with prescribed medication Bottle Appearance: Standard pharmacy container. Clearly labeled. Filled Date: 5011 / 11 / 2019 Last Medication intake:  Today

## 2018-02-20 ENCOUNTER — Other Ambulatory Visit: Payer: Self-pay | Admitting: Family Medicine

## 2018-03-01 ENCOUNTER — Ambulatory Visit (INDEPENDENT_AMBULATORY_CARE_PROVIDER_SITE_OTHER): Payer: Managed Care, Other (non HMO) | Admitting: Gynecology

## 2018-03-01 ENCOUNTER — Encounter: Payer: Self-pay | Admitting: Gynecology

## 2018-03-01 VITALS — BP 122/80 | Ht 64.0 in | Wt 250.0 lb

## 2018-03-01 DIAGNOSIS — Z30431 Encounter for routine checking of intrauterine contraceptive device: Secondary | ICD-10-CM | POA: Diagnosis not present

## 2018-03-01 DIAGNOSIS — Z1151 Encounter for screening for human papillomavirus (HPV): Secondary | ICD-10-CM

## 2018-03-01 DIAGNOSIS — Z01419 Encounter for gynecological examination (general) (routine) without abnormal findings: Secondary | ICD-10-CM

## 2018-03-01 NOTE — Patient Instructions (Signed)
Schedule your colonoscopy with either:  [facility]   Address: [address], [place], [place] [address]  [phone]    or  [facility]  Address: [address], [place], [place] [address]  [phone]      

## 2018-03-01 NOTE — Progress Notes (Signed)
    Debra Myers May 10, 1965 [id number]        53 y.o.  [name] for annual gynecologic exam.  Doing well without gynecologic complaints  Past medical history,surgical history, problem list, medications, allergies, family history and social history were all reviewed and documented as reviewed in the EPIC chart.  ROS:  Performed with pertinent positives and negatives included in the history, assessment and plan.   Additional significant findings : None   Exam: Kennon Portela assistant Vitals:   03/01/18 1222  BP: 122/80  Weight: 250 lb (113.4 kg)  Height: 5\' 4"  (1.626 m)   Body mass index is 42.91 kg/m.  General appearance:  Normal affect, orientation and appearance. Skin: Grossly normal HEENT: Without gross lesions.  No cervical or supraclavicular adenopathy. Thyroid normal.  Lungs:  Clear without wheezing, rales or rhonchi Cardiac: RR, without RMG Abdominal:  Soft, nontender, without masses, guarding, rebound, organomegaly or hernia Breasts:  Examined lying and sitting without masses, retractions, discharge or axillary adenopathy. Pelvic:  Ext, BUS, Vagina: Normal  Cervix: Normal.  IUD string palpated at external loss.  Pap smear/HPV  Uterus: Difficult to palpate but no gross masses or tenderness.  Adnexa: Without masses or tenderness    Anus and perineum: Normal   Rectovaginal: Normal sphincter tone without palpated masses or tenderness.    Assessment/Plan:  53 y.o. G73P1011 female for annual gynecologic exam without menses, Mirena IUD.   1. Mirena IUD 09/2014.  Doing well without menses.  IUD string palpated. 2. History of small calcified area right ovary 13 x 12 mm on ultrasound 2017.  Negative color flow.  Negative CA 125.  Serial ultrasound showed area remained unchanged.  Discussed continuing monitoring versus no further studies and she is comfortable with no further studies. 3. Pap smear/HPV 2015.  Pap smear/HPV today.  No history of abnormal Pap smears  previously. 4. Mammography coming due in several months.  I reminded her to schedule this.  Breast exam normal today. 5. Colonoscopy never.  I reminded patient to schedule screening colonoscopy as she is over 50.  Names and numbers provided. 6. Health maintenance.  No routine lab work done as patient does this elsewhere.  Follow-up 1 year, sooner as needed.   Dara Lords MD, 12:53 PM 03/01/2018

## 2018-03-02 LAB — PAP IG AND HPV HIGH-RISK: HPV DNA High Risk: NOT DETECTED

## 2018-03-17 ENCOUNTER — Other Ambulatory Visit: Payer: Self-pay | Admitting: Family Medicine

## 2018-03-17 NOTE — Telephone Encounter (Signed)
Name of Medication: spironolactone 25 mg Name of Pharmacy: [facility] [address] Last Fill or Written Date and Quantity: # 90 on 12/20/17 Last Office Visit and Type: 10/03/17 establish care Next Office Visit and Type:04/05/18 CPX  Refilled x 1 until pt seen for CPX to discuss further refills.

## 2018-04-05 ENCOUNTER — Encounter: Payer: Self-pay | Admitting: Family Medicine

## 2018-04-16 ENCOUNTER — Other Ambulatory Visit: Payer: Self-pay | Admitting: Family Medicine

## 2018-04-18 ENCOUNTER — Other Ambulatory Visit: Payer: Self-pay

## 2018-04-18 ENCOUNTER — Encounter: Payer: Self-pay | Admitting: Nurse Practitioner

## 2018-04-18 ENCOUNTER — Ambulatory Visit: Payer: Managed Care, Other (non HMO) | Attending: Nurse Practitioner | Admitting: Nurse Practitioner

## 2018-04-18 VITALS — BP 142/85 | HR 106 | Temp 98.0°F | Resp 16 | Ht 64.0 in | Wt 255.0 lb

## 2018-04-18 DIAGNOSIS — M47816 Spondylosis without myelopathy or radiculopathy, lumbar region: Secondary | ICD-10-CM | POA: Diagnosis not present

## 2018-04-18 DIAGNOSIS — Z79891 Long term (current) use of opiate analgesic: Secondary | ICD-10-CM

## 2018-04-18 DIAGNOSIS — G894 Chronic pain syndrome: Secondary | ICD-10-CM | POA: Diagnosis not present

## 2018-04-18 DIAGNOSIS — M47812 Spondylosis without myelopathy or radiculopathy, cervical region: Secondary | ICD-10-CM | POA: Diagnosis not present

## 2018-04-18 DIAGNOSIS — M62838 Other muscle spasm: Secondary | ICD-10-CM | POA: Diagnosis present

## 2018-04-18 DIAGNOSIS — G8929 Other chronic pain: Secondary | ICD-10-CM

## 2018-04-18 DIAGNOSIS — M7918 Myalgia, other site: Secondary | ICD-10-CM | POA: Diagnosis present

## 2018-04-18 DIAGNOSIS — M25562 Pain in left knee: Secondary | ICD-10-CM

## 2018-04-18 MED ORDER — HYDROCODONE-ACETAMINOPHEN 5-325 MG PO TABS: 0.5000 | ORAL_TABLET | Freq: Two times a day (BID) | ORAL | 0 refills | Status: DC | PRN

## 2018-04-18 MED ORDER — IBUPROFEN 800 MG PO TABS: 800.0000 mg | ORAL_TABLET | Freq: Three times a day (TID) | ORAL | 2 refills | Status: AC | PRN

## 2018-04-18 MED ORDER — METAXALONE 800 MG PO TABS: 800.0000 mg | ORAL_TABLET | Freq: Three times a day (TID) | ORAL | 0 refills | Status: DC | PRN

## 2018-04-18 NOTE — Patient Instructions (Addendum)
____________________________________________________________________________________________  Medication Rules  Purpose: To inform patients, and their family members, of our rules and regulations.  Applies to: All patients receiving prescriptions (written or electronic).  Pharmacy of record: Pharmacy where electronic prescriptions will be sent. If written prescriptions are taken to a different pharmacy, please inform the nursing staff. The pharmacy listed in the electronic medical record should be the one where you would like electronic prescriptions to be sent.  Electronic prescriptions: In compliance with the [place] Strengthen Opioid Misuse Prevention (STOP) Act of 2017 (Session Law 2017-74/H243), effective February 22, 2018, all controlled substances must be electronically prescribed. Calling prescriptions to the pharmacy will cease to exist.  Prescription refills: Only during scheduled appointments. Applies to all prescriptions.  NOTE: The following applies primarily to controlled substances (Opioid* Pain Medications).   Patient's responsibilities: 1. Pain Pills: Bring all pain pills to every appointment (except for procedure appointments). 2. Pill Bottles: Bring pills in original pharmacy bottle. Always bring the newest bottle. Bring bottle, even if empty. 3. Medication refills: You are responsible for knowing and keeping track of what medications you take and those you need refilled. The day before your appointment: write a list of all prescriptions that need to be refilled. The day of the appointment: give the list to the admitting nurse. Prescriptions will be written only during appointments. No prescriptions will be written on procedure days. If you forget a medication: it will not be "Called in", "Faxed", or "electronically sent". You will need to get another appointment to get these prescribed. No early refills. Do not call asking to have your prescription filled  early. 4. Prescription Accuracy: You are responsible for carefully inspecting your prescriptions before leaving our office. Have the discharge nurse carefully go over each prescription with you, before taking them home. Make sure that your name is accurately spelled, that your address is correct. Check the name and dose of your medication to make sure it is accurate. Check the number of pills, and the written instructions to make sure they are clear and accurate. Make sure that you are given enough medication to last until your next medication refill appointment. 5. Taking Medication: Take medication as prescribed. When it comes to controlled substances, taking less pills or less frequently than prescribed is permitted and encouraged. Never take more pills than instructed. Never take medication more frequently than prescribed.  6. Inform other Doctors: Always inform, all of your healthcare providers, of all the medications you take. 7. Pain Medication from other Providers: You are not allowed to accept any additional pain medication from any other Doctor or Healthcare provider. There are two exceptions to this rule. (see below) In the event that you require additional pain medication, you are responsible for notifying us, as stated below. 8. Medication Agreement: You are responsible for carefully reading and following our Medication Agreement. This must be signed before receiving any prescriptions from our practice. Safely store a copy of your signed Agreement. Violations to the Agreement will result in no further prescriptions. (Additional copies of our Medication Agreement are available upon request.) 9. Laws, Rules, & Regulations: All patients are expected to follow all Federal and State Laws, Statutes, Rules, & Regulations. Ignorance of the Laws does not constitute a valid excuse. The use of any illegal substances is prohibited. 10. Adopted CDC guidelines & recommendations: Target dosing levels will be  at or below 60 MME/day. Use of benzodiazepines** is not recommended.  Exceptions: There are only two exceptions to the rule of not   receiving pain medications from other Healthcare Providers. 1. Exception #1 (Emergencies): In the event of an emergency (i.e.: accident requiring emergency care), you are allowed to receive additional pain medication. However, you are responsible for: As soon as you are able, call our office [phone], at any time of the day or night, and leave a message stating your name, the date and nature of the emergency, and the name and dose of the medication prescribed. In the event that your call is answered by a member of our staff, make sure to document and save the date, time, and the name of the person that took your information.  2. Exception #2 (Planned Surgery): In the event that you are scheduled by another doctor or dentist to have any type of surgery or procedure, you are allowed (for a period no longer than 30 days), to receive additional pain medication, for the acute post-op pain. However, in this case, you are responsible for picking up a copy of our "Post-op Pain Management for Surgeons" handout, and giving it to your surgeon or dentist. This document is available at our office, and does not require an appointment to obtain it. Simply go to our office during business hours (Monday-Thursday from 8:00 AM to 4:00 PM) (Friday 8:00 AM to 12:00 Noon) or if you have a scheduled appointment with [name], prior to your surgery, and ask for it by name. In addition, you will need to provide [name] with your name, name of your surgeon, type of surgery, and date of procedure or surgery.  *Opioid medications include: morphine, codeine, oxycodone, oxymorphone, hydrocodone, hydromorphone, meperidine, tramadol, tapentadol, buprenorphine, fentanyl, methadone. **Benzodiazepine medications include: diazepam (Valium), alprazolam (Xanax), clonazepam (Klonopine), lorazepam (Ativan), clorazepate  (Tranxene), chlordiazepoxide (Librium), estazolam (Prosom), oxazepam (Serax), temazepam (Restoril), triazolam (Halcion) (Last updated: 04/21/2017) ____________________________________________________________________________________________  Debra Myers have been escribed 3 scripts for Hydrocodone  08-16-2018 to last until  and one for skelaxin today.       Sodium Hyaluronate intra-articular injection What is this medicine? SODIUM HYALURONATE (SOE dee um hye al yoor ON ate) is used to treat pain in the knee due to osteoarthritis. This medicine may be used for other purposes; ask your health care provider or pharmacist if you have questions. COMMON BRAND NAME(S): Amvisc, DUROLANE, Euflexxa, GELSYN-3, Hyalgan, Hymovis, Monovisc, Orthovisc, Supartz, Supartz FX, TriVisc, VISCO What should I tell my health care provider before I take this medicine? They need to know if you have any of these conditions: -bleeding disorders -glaucoma -infection in the knee joint -skin conditions or sensitivity -skin infection -an unusual allergic reaction to sodium hyaluronate, other medicines, foods, dyes, or preservatives. Different brands of sodium hyaluronate contain different allergens. Some may contain egg. Talk to your doctor about your allergies to make sure that you get the right product. -pregnant or trying to get pregnant -breast-feeding How should I use this medicine? This medicine is for injection into the knee joint. It is given by a health care professional in a hospital or clinic setting. Talk to your pediatrician regarding the use of this medicine in children. Special care may be needed. Overdosage: If you think you have taken too much of this medicine contact a poison control center or emergency room at once. NOTE: This medicine is only for you. Do not share this medicine with others. What if I miss a dose? This does not apply. What may interact with this medicine? Interactions are not  expected. This list may not describe all possible interactions. Give your health care  provider a list of all the medicines, herbs, non-prescription drugs, or dietary supplements you use. Also tell them if you smoke, drink alcohol, or use illegal drugs. Some items may interact with your medicine. What should I watch for while using this medicine? Tell your doctor or healthcare professional if your symptoms do not start to get better or if they get worse. If receiving this medicine for osteoarthritis, limit your activity after you receive your injection. Avoid physical activity for 48 hours following your injection to keep your knee from swelling. Do not stand on your feet for more than 1 hour at a time during the first 48 hours following your injection. Ask your doctor or healthcare professional about when you can begin major physical activity again. What side effects may I notice from receiving this medicine? Side effects that you should report to your doctor or health care professional as soon as possible: -allergic reactions like skin rash, itching or hives, swelling of the face, lips, or tongue -dizziness -facial flushing -pain, tingling, numbness in the hands or feet -vision changes if received this medicine during eye surgery Side effects that usually do not require medical attention (report to your doctor or health care professional if they continue or are bothersome): -back pain -bruising at site where injected -chills -diarrhea -fever -headache -joint pain -joint stiffness -joint swelling -muscle cramps -muscle pain -nausea, vomiting -pain, redness, or irritation at site where injected -weak or tired This list may not describe all possible side effects. Call your doctor for medical advice about side effects. You may report side effects to FDA at 1-800-FDA-1088. Where should I keep my medicine? This drug is given in a hospital or clinic and will not be stored at home. NOTE: This  sheet is a summary. It may not cover all possible information. If you have questions about this medicine, talk to your doctor, pharmacist, or health care provider.  2019 [employer] Standard (2015-03-13 08:34:51)

## 2018-04-18 NOTE — Progress Notes (Signed)
Nursing Pain Medication Assessment:  Safety precautions to be maintained throughout the outpatient stay will include: orient to surroundings, keep bed in low position, maintain call bell within reach at all times, provide assistance with transfer out of bed and ambulation.  Medication Inspection Compliance: Pill count conducted under aseptic conditions, in front of the patient. Neither the pills nor the bottle was removed from the patient's sight at any time. Once count was completed pills were immediately returned to the patient in their original bottle.  Medication: Hydrocodone/APAP Pill/Patch Count: 2.5 of 60 pills remain Pill/Patch Appearance: Markings consistent with prescribed medication Bottle Appearance: Standard pharmacy container. Clearly labeled. Filled Date: 1 / 24 / 2020 Last Medication intake:  Today

## 2018-04-18 NOTE — Progress Notes (Signed)
Patient's Name: Debra Myers  MRN: [id number]  Referring Provider: Emi BelfastGessner, Deborah B, FNP  DOB: 01-26-1966  PCP: Emi BelfastGessner, Deborah B, FNP  DOS: 04/18/2018  Note by: Thad Rangerrystal King, NP  Service setting: Ambulatory outpatient  Specialty: Interventional Pain Management  Location: [facility] (AMB) Pain Management Facility    Patient type: Established   HPI  Reason for Visit: Debra Myers is a 53 y.o. year old, female patient, who comes today with a chief complaint of Back Pain (lower) and Knee Pain (left) Last Appointment: Her last appointment at our practice was on 01/10/2018. I last saw her on 01/10/2018.  Pain Assessment: Today, Debra Myers describes the severity of the Chronic pain as a 2 /10. She indicates the location/referral of the pain to be Back Lower, Mid/left hip. Onset was: More than a month ago. The quality of pain is described as Aching. Temporal description, or timing of pain is:  . Possible modifying factors: Chiropator, medications. Ms. Swing's  height is 5\' 4"  (1.626 m) and weight is 255 lb (115.7 kg). Her temperature is 98 F (36.7 C). Her blood pressure is 142/85 (abnormal) and her pulse is 106 (abnormal). Her respiration is 16 and oxygen saturation is 98%.   She has a left knee bakers cyst and was treated by Ortho with injection. She is having more back pain. She feels like the pain has shifted to her back on the right and in the middle. She is going to follow up with ortho. She admits that last year 2/19 and she had left knee bruising. She did not have treatment for her knee. She does go to the chiropractor.   ROS  Constitutional: Denies any fever or chills Gastrointestinal: No reported hemesis, hematochezia, vomiting, or acute GI distress Musculoskeletal: Denies any acute onset joint swelling, redness, loss of ROM, or weakness Neurological: No reported episodes of acute onset apraxia, aphasia, dysarthria, agnosia, amnesia, paralysis, loss of coordination, or loss of  consciousness  Medication Review  HYDROcodone-acetaminophen, LORazepam, PARoxetine, albuterol, aspirin, atorvastatin, buPROPion, cetirizine, fluticasone, ibuprofen, lamoTRIgine, lisdexamfetamine, metFORMIN, metaxalone, montelukast, and spironolactone  History Review  Allergy: Debra Myers is allergic to oxycodone; penicillins; red dye; and sulfa antibiotics. Drug: Debra Myers  reports no history of drug use. Alcohol:  reports current alcohol use. Tobacco:  reports that she has never smoked. She has never used smokeless tobacco. Social: Debra Myers  reports that she has never smoked. She has never used smokeless tobacco. She reports current alcohol use. She reports that she does not use drugs. Medical:  has a past medical history of Anxiety, Asthma, Bulging lumbar disc (L3-4) (02/26/2015), Degenerative disc disease, Degenerative lumbar disc, Depression, Diabetes mellitus, Elective abortion, GERD (gastroesophageal reflux disease), Hypertension, Stroke (HCC) (06/11/2015), and Vaginal delivery. Surgical: Debra Myers  has a past surgical history that includes Cholecystectomy (2001); Lapband (04/2012); Wisdom tooth extraction; Dilatation & curettage/hysteroscopy with myosure (N/A, 09/10/2014); mirena; and lap band surgery. Family: family history includes Asthma in her maternal grandmother; Diabetes in her father; Hypertension in her father and mother; Stroke in her maternal grandfather and maternal grandmother. Problem List: Debra Myers has Numbness of upper extremity; Radiculitis involving upper extremity; Chronic low back pain (Location of Primary Source of Pain) (Bilateral) (L>R); Lumbar spondylosis (Bulging Disc & Severe Left Foraminal Stenosis at L3-4); Chronic lower extremity pain (Location of Secondary source of pain) (Left); Lumbar foraminal stenosis (Severe Left L3-4); Lumbar facet syndrome (Location of Primary Source of Pain) (Bilateral) (L>R); Cervical spondylosis (C5-6 & C6-7 DDD); Cervical (3  mm) Grade 1  Anterolisthesis of C4 over C5; Chronic neck pain (Location of Tertiary source of pain) (Bilateral) (L>R); Chronic cervical radicular pain (Bilateral) (L>R); Muscle spasm, nocturnal; Musculoskeletal pain; Carpal tunnel syndrome (Bilateral) (L>R); Cervical facet syndrome (Location of Tertiary source of pain) (Bilateral) (L>R); Chronic pain syndrome; and Chronic pain of left knee on their pertinent problem list.  Lab Review  Kidney Function Lab Results  Component Value Date   BUN 18 12/16/2017   CREATININE 0.94 12/16/2017   BCR 15 05/26/2017   GFRAA >60 12/16/2017   GFRNONAA >60 12/16/2017  Liver Function Lab Results  Component Value Date   AST 16 05/26/2017   ALT 14 05/26/2017   ALBUMIN 4.1 05/26/2017  Note: Above Lab results reviewed.  Imaging Review  VAS [name] LOWER EXTREMITY VENOUS (DVT) ([facility] and [facility] 7a-7p)  Lower Venous Study  Indications: Pain.   Performing Technologist: Gertie Fey MHA, RDMS, RVT, RDCS    Examination Guidelines: A complete evaluation includes B-mode imaging, spectral Doppler, color Doppler, and power Doppler as needed of all accessible portions of each vessel. Bilateral testing is considered an integral part of a complete examination. Limited examinations for reoccurring indications may be performed as noted.    Right Venous Findings: +---+---------------+---------+-----------+----------+-------+    CompressibilityPhasicitySpontaneityPropertiesSummary +---+---------------+---------+-----------+----------+-------+ CFVFull           Yes      Yes                          +---+---------------+---------+-----------+----------+-------+     Left Venous Findings: +---------+---------------+---------+-----------+----------+-------+          CompressibilityPhasicitySpontaneityPropertiesSummary +---------+---------------+---------+-----------+----------+-------+ CFV      Full           Yes      Yes                           +---------+---------------+---------+-----------+----------+-------+ SFJ      Full                                                 +---------+---------------+---------+-----------+----------+-------+ FV Prox  Full                                                 +---------+---------------+---------+-----------+----------+-------+ FV Mid   Full                                                 +---------+---------------+---------+-----------+----------+-------+ FV DistalFull                                                 +---------+---------------+---------+-----------+----------+-------+ PFV      Full                                                 +---------+---------------+---------+-----------+----------+-------+  POP      Full           Yes      Yes                          +---------+---------------+---------+-----------+----------+-------+ PTV      Full                                                 +---------+---------------+---------+-----------+----------+-------+ PERO     Full                                                 +---------+---------------+---------+-----------+----------+-------+          Summary: Right: No evidence of common femoral vein obstruction. Left: There is no evidence of deep vein thrombosis in the lower extremity. No cystic structure found in the popliteal fossa.   *See table(s) above for measurements and observations.  Electronically signed by Sherald Hess MD on 12/16/2017 at 6:04:46 PM.      Final   Note: Reviewed        Physical Exam  General appearance: Well nourished, well developed, and well hydrated. In no apparent acute distress Mental status: Alert, oriented x 3 (person, place, & time)       Respiratory: No evidence of acute respiratory distress Eyes: PERLA Vitals: BP (!) 142/85   Pulse (!) 106   Temp 98 F (36.7 C)   Resp 16   Ht  (1.626 m)   Wt 255 lb (115.7 kg)    SpO2 98%   BMI 43.77 kg/m  BMI: Estimated body mass index is 43.77 kg/m as calculated from the following:   Height as of this encounter:  (1.626 m).   Weight as of this encounter: 255 lb (115.7 kg). Ideal: Ideal body weight: 54.7 kg (120 lb 9.5 oz) Adjusted ideal body weight: 79.1 kg (174 lb 5.7 oz) Lumbar Spine Area Exam  Skin & Axial Inspection: No masses, redness, or swelling Alignment: Symmetrical Functional ROM: Unrestricted ROM       Stability: No instability detected Muscle Tone/Strength: Functionally intact. No obvious neuro-muscular anomalies detected. Sensory (Neurological): Unimpaired Palpation: Complains of area being tender to palpation       Provocative Tests: Hyperextension/rotation test: deferred today       Lumbar quadrant test (Kemp's test): deferred today       Lateral bending test: deferred today       Patrick's Maneuver: deferred today                    Gait & Posture Assessment  Ambulation: Unassisted Gait: Relatively normal for age and body habitus Posture: WNL   Lower Extremity Exam    Side: Right lower extremity  Side: Left lower extremity  Stability: No instability observed          Stability: No instability observed          Skin & Extremity Inspection: Skin color, temperature, and hair growth are WNL. No peripheral edema or cyanosis. No masses, redness, swelling, asymmetry, or associated skin lesions. No contractures.  Skin & Extremity Inspection: Skin color, temperature, and hair growth are WNL. No peripheral edema  or cyanosis. No masses, redness, swelling, asymmetry, or associated skin lesions. No contractures.  Functional ROM: Unrestricted ROM                  Functional ROM: Adequate ROM                  Muscle Tone/Strength: Functionally intact. No obvious neuro-muscular anomalies detected.  Muscle Tone/Strength: Functionally intact. No obvious neuro-muscular anomalies detected.  Sensory (Neurological): Unimpaired        Sensory  (Neurological): Movement-associated discomfort        Palpation: No palpable anomalies  Palpation: Complains of area being tender to palpation   Assessment   Status Diagnosis  Worsening Persistent Controlled 1. Chronic pain of left knee   2. Lumbar spondylosis (Bulging Disc & Severe Left Foraminal Stenosis at L3-4)   3. Cervical spondylosis (C5-6 & C6-7 DDD)   4. Chronic pain syndrome   5. Long term prescription opiate use   6. Muscle spasm, nocturnal   7. Musculoskeletal pain      Updated Problems: Problem  Chronic Pain of Left Knee    Plan of Care  Pharmacotherapy (Medications Ordered): Meds ordered this encounter  Medications  . HYDROcodone-acetaminophen (NORCO/VICODIN) 5-325 MG tablet    Sig: Take 0.5-1 tablets by mouth 2 (two) times daily as needed for up to 30 days for severe pain.    Dispense:  60 tablet    Refill:  0    Do not place this medication, or any other prescription from our practice, on "Automatic Refill". Patient may have prescription filled one day early if pharmacy is closed on scheduled refill date.    Order Specific Question:   Supervising Provider    Answer:   Delano Metz [phone]  . HYDROcodone-acetaminophen (NORCO/VICODIN) 5-325 MG tablet    Sig: Take 0.5-1 tablets by mouth 2 (two) times daily as needed for up to 30 days for severe pain.    Dispense:  60 tablet    Refill:  0    Do not place this medication, or any other prescription from our practice, on "Automatic Refill". Patient may have prescription filled one day early if pharmacy is closed on scheduled refill date.    Order Specific Question:   Supervising Provider    Answer:   Delano Metz [phone]  . HYDROcodone-acetaminophen (NORCO/VICODIN) 5-325 MG tablet    Sig: Take 0.5-1 tablets by mouth 2 (two) times daily as needed for up to 30 days for severe pain.    Dispense:  60 tablet    Refill:  0    Do not place this medication, or any other prescription from our practice, on  "Automatic Refill". Patient may have prescription filled one day early if pharmacy is closed on scheduled refill date.    Order Specific Question:   Supervising Provider    Answer:   Delano Metz [phone]  . metaxalone (SKELAXIN) 800 MG tablet    Sig: Take 1 tablet (800 mg total) by mouth 3 (three) times daily as needed for muscle spasms.    Dispense:  90 tablet    Refill:  0    Do not add this medication to the electronic "Automatic Refill" notification system. Patient may have prescription filled one day early if pharmacy is closed on scheduled refill date.    Order Specific Question:   Supervising Provider    Answer:   Delano Metz [phone]  . ibuprofen (ADVIL,MOTRIN) 800 MG tablet    Sig: Take 1 tablet (  800 mg total) by mouth every 8 (eight) hours as needed for up to 30 days for moderate pain.    Dispense:  90 tablet    Refill:  2    Do not add this medication to the electronic "Automatic Refill" notification system. Patient may have prescription filled one day early if pharmacy is closed on scheduled refill date.    Order Specific Question:   Supervising Provider    Answer:   Delano Metz [[id number]]   Administered today: Debra Clock had no medications administered during this visit.  Orders:  Orders Placed This Encounter  Procedures  . ToxASSURE Select 13 (MW), Urine    Volume: 30 ml(s). Minimum 3 ml of urine is needed. Document temperature of fresh sample. Indications: Long term (current) use of opiate analgesic (W44.628)   Interventional options: Planned follow-up:   Hyalgan Series  Plan: Return in about 3 months (around 07/26/2018) for MedMgmt.   Considering:  Diagnostic left L3-4 lumbar epidural steroid injection  Diagnostic bilateral lumbar facet block  Possible bilateral lumbar facet radiofrequency ablation.  Diagnostic left-sided cervical epidural steroid injection  Diagnostic bilateral cervical facet block  Possible bilateral cervical facet  radiofrequency ablation.    Palliative PRN treatment(s):  Diagnostic left L3-4 lumbar epidural steroid injection  Diagnostic bilateral lumbar facet block  Diagnostic left-sided cervical epidural steroid injection  Diagnostic bilateral cervical facet block     Note by: Thad Ranger, NP Date: 04/18/2018; Time: 3:08 PM

## 2018-04-21 LAB — TOXASSURE SELECT 13 (MW), URINE

## 2018-05-05 ENCOUNTER — Other Ambulatory Visit: Payer: Self-pay | Admitting: Family Medicine

## 2018-05-05 ENCOUNTER — Other Ambulatory Visit: Payer: Self-pay | Admitting: Nurse Practitioner

## 2018-05-16 ENCOUNTER — Other Ambulatory Visit: Payer: Self-pay | Admitting: Family Medicine

## 2018-06-05 ENCOUNTER — Telehealth: Payer: Self-pay

## 2018-06-05 NOTE — Telephone Encounter (Signed)
Copied from CRM #[id number]. Topic: General - Other >> Jun 02, 2018  3:32 PM Johnson, Shiquita C wrote: Reason for CRM: pt called in just to document. Pt says that she was just cleaning her home with clorox spray. Pt says that last time this happened the provider refilled her inhaler because pt starting having some side effects from the spray. Pt says that she feels completely fine. No concerns at all. Pt just asked that I document this. 

## 2018-06-05 NOTE — Telephone Encounter (Signed)
Copied from CRM [id number]. Topic: General - Other >> Jun 02, 2018  3:32 PM Debra Myers, [place] C wrote: Reason for CRM: pt called in just to document. Pt says that she was just cleaning her home with clorox spray. Pt says that last time this happened the provider refilled her inhaler because pt starting having some side effects from the spray. Pt says that she feels completely fine. No concerns at all. Pt just asked that I document this.

## 2018-06-11 ENCOUNTER — Other Ambulatory Visit: Payer: Self-pay | Admitting: Family Medicine

## 2018-06-12 NOTE — Telephone Encounter (Signed)
Spoke to pt about scheduling a CPE. She does not want to do it virtually. Wants Hands On. She is asking how many refills she can get until she can get in the office.

## 2018-06-12 NOTE — Telephone Encounter (Signed)
Please call patient and let her know that she is overdue for blood work. I have sent in a 60 day supply of metformin but she needs an appointment for further refills.

## 2018-06-13 NOTE — Telephone Encounter (Signed)
I scheduled pt for cpe in June, pt is aware

## 2018-06-13 NOTE — Telephone Encounter (Signed)
Can you please schedule virtual appointment with fasting labs prior with D. Leone Payor.

## 2018-06-15 ENCOUNTER — Other Ambulatory Visit: Payer: Self-pay | Admitting: Family Medicine

## 2018-07-12 ENCOUNTER — Other Ambulatory Visit: Payer: Self-pay | Admitting: Nurse Practitioner

## 2018-07-14 ENCOUNTER — Telehealth: Payer: Self-pay | Admitting: *Deleted

## 2018-07-18 ENCOUNTER — Other Ambulatory Visit: Payer: Self-pay | Admitting: Family Medicine

## 2018-07-18 NOTE — Progress Notes (Signed)
Pain Management Virtual Encounter Note - Virtual Visit via Telephone Telehealth (real-time audio visits between healthcare provider and patient).  Patient's Phone No. & Preferred Pharmacy:  [phone] (home); [phone] (mobile); (Preferred) [phone] [email or web address]  [id number] - [place], [place] - [address] AT [facility] OF [address] [facility] [address] & [address] [place] [address] Phone: [phone] Fax: [phone]   Pre-screening note:  Our staff contacted [name] and offered her an "in person", "face-to-face" appointment versus a telephone encounter. She indicated preferring the telephone encounter, at this time.  Reason for Virtual Visit: COVID-19*  Social distancing based on CDC and AMA recommendations.   I contacted Debra Myers on 07/20/2018 at 10:11 AM via telephone.      I clearly identified myself as Oswaldo Done, MD. I verified that I was speaking with the correct person using two identifiers (Name and date of birth: [birth year]).  Advanced Informed Consent I sought verbal advanced consent from Debra Myers for virtual visit interactions. I informed Debra Myers of possible security and privacy concerns, risks, and limitations associated with providing "not-in-person" medical evaluation and management services. I also informed Debra Myers of the availability of "in-person" appointments. Finally, I informed her that there would be a charge for the virtual visit and that she could be  personally, fully or partially, financially responsible for it. Debra Myers expressed understanding and agreed to proceed.   Historic Elements   Debra Myers is a 53 y.o. year old, female patient evaluated today after her last encounter by our practice on 07/14/2018. [name]  has a past medical history of Anxiety, Asthma, Bulging lumbar disc (L3-4) (02/26/2015), Degenerative disc disease, Degenerative lumbar disc, Depression, Diabetes  mellitus, Elective abortion, GERD (gastroesophageal reflux disease), Hypertension, Stroke (HCC) (06/11/2015), and Vaginal delivery. She also  has a past surgical history that includes Cholecystectomy (2001); Lapband (04/2012); Wisdom tooth extraction; Dilatation & curettage/hysteroscopy with myosure (N/A, 09/10/2014); mirena; and lap band surgery. Debra Myers has a current medication list which includes the following prescription(s): albuterol, atorvastatin, bupropion, cetirizine, fluticasone, hydrocodone-acetaminophen, hydrocodone-acetaminophen, hydrocodone-acetaminophen, lamotrigine, lorazepam, meloxicam, metaxalone, metformin, montelukast, paroxetine, ra aspirin ec adult low st, spironolactone, and vyvanse. She  reports that she has never smoked. She has never used smokeless tobacco. She reports current alcohol use. She reports that she does not use drugs. [name] is allergic to oxycodone; penicillins; red dye; and sulfa antibiotics.   HPI  I last communicated with her on Visit date not found. Today, she is being contacted for medication management.  The patient refers doing relatively well with her current medication regimen and not having any side effects.  Today she has requested a refill on her ibuprofen 800 mg.  However, I have talked to her about the relationship between the ibuprofen and the gastroesophageal reflux disease and I have offered her the option of trying Mobic 15 mg p.o. daily in a.m. with breakfast.  She is willing to give it a try and therefore I have given her a prescription for this.  I will follow-up with her in approximately 90 days to see how she is doing on the meloxicam trial.  Should this not work for her, we have agreed to go back to the ibuprofen 800 mg p.o. twice daily.  She indicates that when she takes the ibuprofen she also uses a proton pump inhibitor, such as Prilosec OTC.  She indicates that this combination works well for her and tends to minimize the  GI discomfort.  In any  case she also indicated taking her ibuprofen with meals.  Pharmacotherapy Assessment  Analgesic: Hydrocodone/APAP 5/325 2 tablets per day (10 mg/day) MME/day:10 mg/day   Monitoring: Pharmacotherapy: No side-effects or adverse reactions reported. [place] PMP: PDMP reviewed during this encounter.       Compliance: No problems identified. Effectiveness: Clinically acceptable. Plan: Refer to "POC".  Pertinent Labs  Renal Function Lab Results  Component Value Date   BUN 18 12/16/2017   CREATININE 0.94 12/16/2017   BCR 15 05/26/2017   GFRAA >60 12/16/2017   GFRNONAA >60 12/16/2017   Hepatic Function Lab Results  Component Value Date   AST 16 05/26/2017   ALT 14 05/26/2017   ALBUMIN 4.1 05/26/2017   UDS Summary  Date Value Ref Range Status  04/18/2018 FINAL  Final    Comment:    ==================================================================== TOXASSURE SELECT 13 (MW) ==================================================================== Test                             Result       Flag       Units Drug Present and Declared for Prescription Verification   Amphetamine                    1499         EXPECTED   ng/mg creat    Amphetamine is available as a schedule II prescription drug.   Lorazepam                      731          EXPECTED   ng/mg creat    Source of lorazepam is a scheduled prescription medication.   Hydrocodone                    536          EXPECTED   ng/mg creat   Dihydrocodeine                 96           EXPECTED   ng/mg creat   Norhydrocodone                 666          EXPECTED   ng/mg creat    Sources of hydrocodone include scheduled prescription    medications. Dihydrocodeine and norhydrocodone are expected    metabolites of hydrocodone. Dihydrocodeine is also available as a    scheduled prescription medication. ==================================================================== Test                      Result    Flag   Units      Ref Range    Creatinine              95               mg/dL      >=09 ==================================================================== Declared Medications:  The flagging and interpretation on this report are based on the  following declared medications.  Unexpected results may arise from  inaccuracies in the declared medications.  **Note: The testing scope of this panel includes these medications:  Amphetamine (Lisdexamfetamine)  Hydrocodone (Hydrocodone-Acetaminophen)  Lorazepam  **Note: The testing scope of this panel does not include following  reported medications:  Acetaminophen (Hydrocodone-Acetaminophen)  Albuterol  Aspirin (Aspirin 81)  Atorvastatin  Bupropion  Cetirizine  Fluticasone  Ibuprofen  Lamotrigine  Metaxalone  Metformin  Montelukast  Paroxetine  Spironolactone ==================================================================== For clinical consultation, please call [phone]. ====================================================================    Note: Above Lab results reviewed.  Recent imaging  VAS US LOWER EXTREMITY VENOUS (DVT) ([facility] and [facility] 7a-7p)  Lower Venous Study  Indications: Pain.   Performing Technologist: Gertie FeyMichelle Simonetti MHA, RDMS, RVT, RDCS    Examination Guidelines: A complete evaluation includes B-mode imaging, spectral Doppler, color Doppler, and power Doppler as needed of all accessible portions of each vessel. Bilateral testing is considered an integral part of a complete examination. Limited examinations for reoccurring indications may be performed as noted.    Right Venous Findings: +---+---------------+---------+-----------+----------+-------+    CompressibilityPhasicitySpontaneityPropertiesSummary +---+---------------+---------+-----------+----------+-------+ CFVFull           Yes      Yes                          +---+---------------+---------+-----------+----------+-------+     Left Venous  Findings: +---------+---------------+---------+-----------+----------+-------+          CompressibilityPhasicitySpontaneityPropertiesSummary +---------+---------------+---------+-----------+----------+-------+ CFV      Full           Yes      Yes                          +---------+---------------+---------+-----------+----------+-------+ SFJ      Full                                                 +---------+---------------+---------+-----------+----------+-------+ FV Prox  Full                                                 +---------+---------------+---------+-----------+----------+-------+ FV Mid   Full                                                 +---------+---------------+---------+-----------+----------+-------+ FV DistalFull                                                 +---------+---------------+---------+-----------+----------+-------+ PFV      Full                                                 +---------+---------------+---------+-----------+----------+-------+ POP      Full           Yes      Yes                          +---------+---------------+---------+-----------+----------+-------+ PTV      Full                                                 +---------+---------------+---------+-----------+----------+-------+  PERO     Full                                                 +---------+---------------+---------+-----------+----------+-------+          Summary: Right: No evidence of common femoral vein obstruction. Left: There is no evidence of deep vein thrombosis in the lower extremity. No cystic structure found in the popliteal fossa.   *See table(s) above for measurements and observations.  Electronically signed by Sherald Hess MD on 12/16/2017 at 6:04:46 PM.      Final    Assessment  The primary encounter diagnosis was Chronic pain syndrome. Diagnoses of Chronic low back pain  (Primary Area of Pain) (Bilateral) (L>R), Chronic lower extremity pain (Secondary area of Pain) (Left), Chronic neck pain (Third area of Pain) (Bilateral) (L>R), Lumbar facet syndrome (Bilateral) (L>R), Cervical facet syndrome (Bilateral) (L>R), Muscle spasm, nocturnal, Musculoskeletal pain, and Osteoarthritis involving multiple joints were also pertinent to this visit.  Plan of Care  I have changed Romi D. Naeve's HYDROcodone-acetaminophen, HYDROcodone-acetaminophen, HYDROcodone-acetaminophen, and metaxalone. I am also having her start on meloxicam. Additionally, I am having her maintain her buPROPion, lamoTRIgine, LORazepam, PARoxetine, cetirizine, RA Aspirin EC Adult Low St, albuterol, Vyvanse, montelukast, atorvastatin, metFORMIN, spironolactone, and fluticasone.  Pharmacotherapy (Medications Ordered): Meds ordered this encounter  Medications  . HYDROcodone-acetaminophen (NORCO/VICODIN) 5-325 MG tablet    Sig: Take 0.5-1 tablets by mouth 2 (two) times daily as needed for up to 30 days for severe pain. Must last 30 days    Dispense:  60 tablet    Refill:  0    Chronic Pain. (STOP Act - Not applicable). Fill one day early if closed on scheduled refill date. Do not fill until: 10/15/18. To last until: 11/14/18.  Marland Kitchen HYDROcodone-acetaminophen (NORCO/VICODIN) 5-325 MG tablet    Sig: Take 0.5-1 tablets by mouth 2 (two) times daily as needed for up to 30 days for severe pain. Must last 30 days    Dispense:  60 tablet    Refill:  0    Chronic Pain. (STOP Act - Not applicable). Fill one day early if closed on scheduled refill date. Do not fill until: 09/15/18. To last until: 10/15/18.  Marland Kitchen HYDROcodone-acetaminophen (NORCO/VICODIN) 5-325 MG tablet    Sig: Take 0.5-1 tablets by mouth 2 (two) times daily as needed for up to 30 days for severe pain. Must last 30 days    Dispense:  60 tablet    Refill:  0    Chronic Pain. (STOP Act - Not applicable). Fill one day early if closed on scheduled refill date. Do  not fill until: 08/16/18. To last until: 09/15/18.  Marland Kitchen metaxalone (SKELAXIN) 800 MG tablet    Sig: Take 1 tablet (800 mg total) by mouth 3 (three) times daily as needed for muscle spasms. Max: 3/day    Dispense:  270 tablet    Refill:  0    Fill one day early if pharmacy is closed on scheduled refill date. May substitute for generic if available.  . meloxicam (MOBIC) 15 MG tablet    Sig: Take 1 tablet (15 mg total) by mouth daily with breakfast.    Dispense:  30 tablet    Refill:  2    Fill one day early if pharmacy is closed on scheduled refill date. May substitute for generic if available.  Orders:  No orders of the defined types were placed in this encounter.  Follow-up plan:   Return for (3 mo) Med-Mgmt.    I discussed the assessment and treatment plan with the patient. The patient was provided an opportunity to ask questions and all were answered. The patient agreed with the plan and demonstrated an understanding of the instructions.  Patient advised to call back or seek an in-person evaluation if the symptoms or condition worsens.  Total duration of non-face-to-face encounter: 20 minutes.  Note by: Oswaldo Done, MD Date: 07/20/2018; Time: 10:11 AM  Note: This dictation was prepared with Dragon dictation. Any transcriptional errors that may result from this process are unintentional.  Disclaimer:  * Given the special circumstances of the COVID-19 pandemic, the federal government has announced that the Office for Civil Rights (OCR) will exercise its enforcement discretion and will not impose penalties on physicians using telehealth in the event of noncompliance with regulatory requirements under the [employer] Portability and Accountability Act (HIPAA) in connection with the good faith provision of telehealth during the COVID-19 national public health emergency. (AMA)

## 2018-07-20 ENCOUNTER — Ambulatory Visit: Payer: No Typology Code available for payment source | Attending: Nurse Practitioner | Admitting: Pain Medicine

## 2018-07-20 ENCOUNTER — Encounter: Payer: Self-pay | Admitting: Pain Medicine

## 2018-07-20 ENCOUNTER — Other Ambulatory Visit: Payer: Self-pay

## 2018-07-20 DIAGNOSIS — G8929 Other chronic pain: Secondary | ICD-10-CM

## 2018-07-20 DIAGNOSIS — M62838 Other muscle spasm: Secondary | ICD-10-CM

## 2018-07-20 DIAGNOSIS — M7918 Myalgia, other site: Secondary | ICD-10-CM

## 2018-07-20 DIAGNOSIS — G894 Chronic pain syndrome: Secondary | ICD-10-CM

## 2018-07-20 DIAGNOSIS — M79605 Pain in left leg: Secondary | ICD-10-CM

## 2018-07-20 DIAGNOSIS — M47816 Spondylosis without myelopathy or radiculopathy, lumbar region: Secondary | ICD-10-CM

## 2018-07-20 DIAGNOSIS — M542 Cervicalgia: Secondary | ICD-10-CM | POA: Diagnosis not present

## 2018-07-20 DIAGNOSIS — M545 Low back pain: Secondary | ICD-10-CM | POA: Diagnosis not present

## 2018-07-20 DIAGNOSIS — M47812 Spondylosis without myelopathy or radiculopathy, cervical region: Secondary | ICD-10-CM

## 2018-07-20 DIAGNOSIS — M159 Polyosteoarthritis, unspecified: Secondary | ICD-10-CM

## 2018-07-20 DIAGNOSIS — M150 Primary generalized (osteo)arthritis: Secondary | ICD-10-CM

## 2018-07-20 MED ORDER — MELOXICAM 15 MG PO TABS: 15.0000 mg | ORAL_TABLET | Freq: Every day | ORAL | 2 refills | Status: DC

## 2018-07-20 MED ORDER — METAXALONE 800 MG PO TABS: 800.0000 mg | ORAL_TABLET | Freq: Three times a day (TID) | ORAL | 0 refills | Status: DC | PRN

## 2018-07-20 MED ORDER — HYDROCODONE-ACETAMINOPHEN 5-325 MG PO TABS: 0.5000 | ORAL_TABLET | Freq: Two times a day (BID) | ORAL | 0 refills | Status: DC | PRN

## 2018-07-20 NOTE — Patient Instructions (Signed)
____________________________________________________________________________________________  Medication Rules  Purpose: To inform patients, and their family members, of our rules and regulations.  Applies to: All patients receiving prescriptions (written or electronic).  Pharmacy of record: Pharmacy where electronic prescriptions will be sent. If written prescriptions are taken to a different pharmacy, please inform the nursing staff. The pharmacy listed in the electronic medical record should be the one where you would like electronic prescriptions to be sent.  Electronic prescriptions: In compliance with the [employer]erma Strengthen Opioid Misuse Prevention (STOP) Act of 2017 (Session Law 2017-74/H243), effective February 22, 2018, all controlled substances must be electronically prescribed. Calling prescriptions to the pharmacy will cease to exist.  Prescription refills: Only during scheduled appointments. Applies to all prescriptions.  NOTE: The following applies primarily to controlled substances (Opioid* Pain Medications).   Patient's responsibilities: 1. Pain Pills: Bring all pain pills to every appointment (except for procedure appointments). 2. Pill Bottles: Bring pills in original pharmacy bottle. Always bring the newest bottle. Bring bottle, even if empty. 3. Medication refills: You are responsible for knowing and keeping track of what medications you take and those you need refilled. The day before your appointment: write a list of all prescriptions that need to be refilled. The day of the appointment: give the list to the admitting nurse. Prescriptions will be written only during appointments. No prescriptions will be written on procedure days. If you forget a medication: it will not be "Called in", "Faxed", or "electronically sent". You will need to get another appointment to get these prescribed. No early refills. Do not call asking to have your prescription filled  early. 4. Prescription Accuracy: You are responsible for carefully inspecting your prescriptions before leaving our office. Have the discharge nurse carefully go over each prescription with you, before taking them home. Make sure that your name is accurately spelled, that your address is correct. Check the name and dose of your medication to make sure it is accurate. Check the number of pills, and the written instructions to make sure they are clear and accurate. Make sure that you are given enough medication to last until your next medication refill appointment. 5. Taking Medication: Take medication as prescribed. When it comes to controlled substances, taking less pills or less frequently than prescribed is permitted and encouraged. Never take more pills than instructed. Never take medication more frequently than prescribed.  6. Inform other Doctors: Always inform, all of your healthcare providers, of all the medications you take. 7. Pain Medication from other Providers: You are not allowed to accept any additional pain medication from any other Doctor or Healthcare provider. There are two exceptions to this rule. (see below) In the event that you require additional pain medication, you are responsible for notifying us, as stated below. 8. Medication Agreement: You are responsible for carefully reading and following our Medication Agreement. This must be signed before receiving any prescriptions from our practice. Safely store a copy of your signed Agreement. Violations to the Agreement will result in no further prescriptions. (Additional copies of our Medication Agreement are available upon request.) 9. Laws, Rules, & Regulations: All patients are expected to follow all Federal and State Laws, Statutes, Rules, & Regulations. Ignorance of the Laws does not constitute a valid excuse. The use of any illegal substances is prohibited. 10. Adopted CDC guidelines & recommendations: Target dosing levels will be  at or below 60 MME/day. Use of benzodiazepines** is not recommended.  Exceptions: There are only two exceptions to the rule of not   receiving pain medications from other Healthcare Providers. 1. Exception #1 (Emergencies): In the event of an emergency (i.e.: accident requiring emergency care), you are allowed to receive additional pain medication. However, you are responsible for: As soon as you are able, call our office [phone], at any time of the day or night, and leave a message stating your name, the date and nature of the emergency, and the name and dose of the medication prescribed. In the event that your call is answered by a member of our staff, make sure to document and save the date, time, and the name of the person that took your information.  2. Exception #2 (Planned Surgery): In the event that you are scheduled by another doctor or dentist to have any type of surgery or procedure, you are allowed (for a period no longer than 30 days), to receive additional pain medication, for the acute post-op pain. However, in this case, you are responsible for picking up a copy of our "Post-op Pain Management for Surgeons" handout, and giving it to your surgeon or dentist. This document is available at our office, and does not require an appointment to obtain it. Simply go to our office during business hours (Monday-Thursday from 8:00 AM to 4:00 PM) (Friday 8:00 AM to 12:00 Noon) or if you have a scheduled appointment with us, prior to your surgery, and ask for it by name. In addition, you will need to provide us with your name, name of your surgeon, type of surgery, and date of procedure or surgery.  *Opioid medications include: morphine, codeine, oxycodone, oxymorphone, hydrocodone, hydromorphone, meperidine, tramadol, tapentadol, buprenorphine, fentanyl, methadone. **Benzodiazepine medications include: diazepam (Valium), alprazolam (Xanax), clonazepam (Klonopine), lorazepam (Ativan), clorazepate  (Tranxene), chlordiazepoxide (Librium), estazolam (Prosom), oxazepam (Serax), temazepam (Restoril), triazolam (Halcion) (Last updated: 04/21/2017) ____________________________________________________________________________________________   ____________________________________________________________________________________________  Medication Recommendations and Reminders  Applies to: All patients receiving prescriptions (written and/or electronic).  Medication Rules & Regulations: These rules and regulations exist for your safety and that of others. They are not flexible and neither are we. Dismissing or ignoring them will be considered "non-compliance" with medication therapy, resulting in complete and irreversible termination of such therapy. (See document titled "Medication Rules" for more details.) In all conscience, because of safety reasons, we cannot continue providing a therapy where the patient does not follow instructions.  Pharmacy of record:   Definition: This is the pharmacy where your electronic prescriptions will be sent.   We do not endorse any particular pharmacy.  You are not restricted in your choice of pharmacy.  The pharmacy listed in the electronic medical record should be the one where you want electronic prescriptions to be sent.  If you choose to change pharmacy, simply notify our nursing staff of your choice of new pharmacy.  Recommendations:  Keep all of your pain medications in a safe place, under lock and key, even if you live alone.   After you fill your prescription, take 1 week's worth of pills and put them away in a safe place. You should keep a separate, properly labeled bottle for this purpose. The remainder should be kept in the original bottle. Use this as your primary supply, until it runs out. Once it's gone, then you know that you have 1 week's worth of medicine, and it is time to come in for a prescription refill. If you do this correctly, it  is unlikely that you will ever run out of medicine.  To make sure that the above recommendation works,   it is very important that you make sure your medication refill appointments are scheduled at least 1 week before you run out of medicine. To do this in an effective manner, make sure that you do not leave the office without scheduling your next medication management appointment. Always ask the nursing staff to show you in your prescription , when your medication will be running out. Then arrange for the receptionist to get you a return appointment, at least 7 days before you run out of medicine. Do not wait until you have 1 or 2 pills left, to come in. This is very poor planning and does not take into consideration that we may need to cancel appointments due to bad weather, sickness, or emergencies affecting our staff.  "Partial Fill": If for any reason your pharmacy does not have enough pills/tablets to completely fill or refill your prescription, do not allow for a "partial fill". You will need a separate prescription to fill the remaining amount, which we will not provide. If the reason for the partial fill is your insurance, you will need to talk to the pharmacist about payment alternatives for the remaining tablets, but again, do not accept a partial fill.  Prescription refills and/or changes in medication(s):   Prescription refills, and/or changes in dose or medication, will be conducted only during scheduled medication management appointments. (Applies to both, written and electronic prescriptions.)  No refills on procedure days. No medication will be changed or started on procedure days. No changes, adjustments, and/or refills will be conducted on a procedure day. Doing so will interfere with the diagnostic portion of the procedure.  No phone refills. No medications will be "called into the pharmacy".  No Fax refills.  No weekend refills.  No Holliday refills.  No after hours  refills.  Remember:  Business hours are:  Monday to Thursday 8:00 AM to 4:00 PM Provider's Schedule: Crystal King, NP - Appointments are:  Medication management: Monday to Thursday 8:00 AM to 4:00 PM Mareon Robinette, MD - Appointments are:  Medication management: Monday and Wednesday 8:00 AM to 4:00 PM Procedure day: Tuesday and Thursday 7:30 AM to 4:00 PM Bilal Lateef, MD - Appointments are:  Medication management: Tuesday and Thursday 8:00 AM to 4:00 PM Procedure day: Monday and Wednesday 7:30 AM to 4:00 PM (Last update: 04/21/2017) ____________________________________________________________________________________________   ____________________________________________________________________________________________  CANNABIDIOL (AKA: CBD Oil or Pills)  Applies to: All patients receiving prescriptions of controlled substances (written and/or electronic).  General Information: Cannabidiol (CBD) was discovered in 1940. It is one of some 113 identified cannabinoids in cannabis (Marijuana) plants, accounting for up to 40% of the plant's extract. As of 2018, preliminary clinical research on cannabidiol included studies of anxiety, cognition, movement disorders, and pain.  Cannabidiol is consummed in multiple ways, including inhalation of cannabis smoke or vapor, as an aerosol spray into the cheek, and by mouth. It may be supplied as CBD oil containing CBD as the active ingredient (no added tetrahydrocannabinol (THC) or terpenes), a full-plant CBD-dominant hemp extract oil, capsules, dried cannabis, or as a liquid solution. CBD is thought not have the same psychoactivity as THC, and may affect the actions of THC. Studies suggest that CBD may interact with different biological targets, including cannabinoid receptors and other neurotransmitter receptors. As of 2018 the mechanism of action for its biological effects has not been determined.  In the [place], cannabidiol has a limited  approval by the Food and Drug Administration (FDA) for treatment of only two types   of epilepsy disorders. The side effects of long-term use of the drug include somnolence, decreased appetite, diarrhea, fatigue, malaise, weakness, sleeping problems, and others.  CBD remains a Schedule I drug prohibited for any use.  Legality: Some manufacturers ship CBD products nationally, an illegal action which the FDA has not enforced in 2018, with CBD remaining the subject of an FDA investigational new drug evaluation, and is not considered legal as a dietary supplement or food ingredient as of December 2018. Federal illegality has made it difficult historically to conduct research on CBD. CBD is openly sold in head shops and health food stores in some states where such sales have not been explicitly legalized.  Warning: Because it is not FDA approved for general use or treatment of pain, it is not required to undergo the same manufacturing controls as prescription drugs.  This means that the available cannabidiol (CBD) may be contaminated with THC.  If this is the case, it will trigger a positive urine drug screen (UDS) test for cannabinoids (Marijuana).  Because a positive UDS for illicit substances is a violation of our medication agreement, your opioid analgesics (pain medicine) may be permanently discontinued. (Last update: 05/12/2017) ____________________________________________________________________________________________    

## 2018-07-31 ENCOUNTER — Other Ambulatory Visit: Payer: Self-pay | Admitting: Family Medicine

## 2018-07-31 ENCOUNTER — Other Ambulatory Visit (INDEPENDENT_AMBULATORY_CARE_PROVIDER_SITE_OTHER): Payer: Managed Care, Other (non HMO)

## 2018-07-31 DIAGNOSIS — E119 Type 2 diabetes mellitus without complications: Secondary | ICD-10-CM

## 2018-07-31 DIAGNOSIS — Z6841 Body Mass Index (BMI) 40.0 and over, adult: Secondary | ICD-10-CM | POA: Diagnosis not present

## 2018-07-31 DIAGNOSIS — E781 Pure hyperglyceridemia: Secondary | ICD-10-CM

## 2018-07-31 LAB — COMPREHENSIVE METABOLIC PANEL
ALT: 14 U/L (ref 0–35)
AST: 14 U/L (ref 0–37)
Albumin: 4.1 g/dL (ref 3.5–5.2)
Alkaline Phosphatase: 108 U/L (ref 39–117)
BUN: 14 mg/dL (ref 6–23)
CO2: 28 mEq/L (ref 19–32)
Calcium: 8.8 mg/dL (ref 8.4–10.5)
Chloride: 103 mEq/L (ref 96–112)
Creatinine, Ser: 0.76 mg/dL (ref 0.40–1.20)
GFR: 79.91 mL/min (ref >60.00)
Glucose, Bld: 110 mg/dL — ABNORMAL HIGH (ref 70–99)
Potassium: 4.6 mEq/L (ref 3.5–5.1)
Sodium: 137 mEq/L (ref 135–145)
Total Bilirubin: 0.5 mg/dL (ref 0.2–1.2)
Total Protein: 6.8 g/dL (ref 6.0–8.3)

## 2018-07-31 LAB — HEMOGLOBIN A1C: Hgb A1c MFr Bld: 6.3 % (ref 4.6–6.5)

## 2018-07-31 LAB — LIPID PANEL
Cholesterol: 118 mg/dL (ref 0–200)
HDL: 43.5 mg/dL (ref >39.00)
LDL Cholesterol: 60 mg/dL (ref 0–99)
NonHDL: 74.08
Total CHOL/HDL Ratio: 3
Triglycerides: 69 mg/dL (ref 0.0–149.0)
VLDL: 13.8 mg/dL (ref 0.0–40.0)

## 2018-08-02 ENCOUNTER — Encounter: Payer: Self-pay | Admitting: Family Medicine

## 2018-08-09 ENCOUNTER — Other Ambulatory Visit: Payer: Self-pay

## 2018-08-09 ENCOUNTER — Ambulatory Visit (INDEPENDENT_AMBULATORY_CARE_PROVIDER_SITE_OTHER): Payer: Managed Care, Other (non HMO) | Admitting: Family Medicine

## 2018-08-09 VITALS — BP 142/78 | HR 94 | Temp 98.3°F | Ht 64.0 in | Wt 243.8 lb

## 2018-08-09 DIAGNOSIS — E119 Type 2 diabetes mellitus without complications: Secondary | ICD-10-CM | POA: Diagnosis not present

## 2018-08-09 DIAGNOSIS — Z0000 Encounter for general adult medical examination without abnormal findings: Secondary | ICD-10-CM | POA: Diagnosis not present

## 2018-08-09 DIAGNOSIS — Z6841 Body Mass Index (BMI) 40.0 and over, adult: Secondary | ICD-10-CM

## 2018-08-09 DIAGNOSIS — Z1211 Encounter for screening for malignant neoplasm of colon: Secondary | ICD-10-CM | POA: Diagnosis not present

## 2018-08-09 NOTE — Progress Notes (Signed)
Subjective:    Patient ID: Debra Myers, female    DOB: Nov 29, 1965, 53 y.o.   MRN: [id number]  HPI This is a 53 yo female who presents today for CPE. Has overall been doing well. Had a job at [employer] but was unable to perform lifting required. Not currently working.   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  Flu-annual Eye-overdue, rescheduled due to pandemic.  Dental- regular Exercise- not regular  Past Medical History:  Diagnosis Date  . Anxiety   . Asthma    ALLERGY INDUCED  . Bulging lumbar disc (L3-4) 02/26/2015  . Degenerative disc disease   . Degenerative lumbar disc   . Depression   . Diabetes mellitus    TYPE 2  . Elective abortion    ONE  . GERD (gastroesophageal reflux disease)    takes prilosec   . Hypertension   . Stroke ([facility]) 06/11/2015   patient describes as mini stroke  . Vaginal delivery    ONE NSVD   Past Surgical History:  Procedure Laterality Date  . CHOLECYSTECTOMY  2001  . DILATATION & CURETTAGE/HYSTEROSCOPY WITH MYOSURE N/A 09/10/2014   Procedure: DILATATION & CURETTAGE/HYSTEROSCOPY WITH MYOSURE/INSERTION OF IUD;  Surgeon: Ok EdwardsJuan H Fernandez, MD;  Location: [facility];  Service: Gynecology;  Laterality: N/A;  . lap band surgery    . Lapband  04/2012  . mirena     inserted 09-2014  . WISDOM TOOTH EXTRACTION     Family History  Problem Relation Age of Onset  . Hypertension Father   . Diabetes Father   . Hypertension Mother   . Asthma Maternal Grandmother   . Stroke Maternal Grandmother   . Stroke Maternal Grandfather    Social History   Tobacco Use  . Smoking status: Never Smoker  . Smokeless tobacco: Never Used  Substance Use Topics  . Alcohol use: Yes    Alcohol/week: 0.0 standard drinks    Comment: rarely  . Drug use: No      Review of Systems  Constitutional: Negative.   HENT: Negative.   Eyes: Negative.    Respiratory: Negative.   Cardiovascular: Negative.   Gastrointestinal: Negative.   Endocrine: Negative.   Genitourinary: Negative.   Musculoskeletal: Positive for back pain (chronic, sees pain management).  Skin: Negative.   Allergic/Immunologic: Negative.   Neurological: Positive for headaches.  Hematological: Negative.   Psychiatric/Behavioral: Negative.        Objective:   Physical Exam Vitals signs reviewed.  Constitutional:      Appearance: Normal appearance. She is obese.  HENT:     Head: Normocephalic and atraumatic.     Right Ear: Tympanic membrane, ear canal and external ear normal.     Left Ear: Tympanic membrane, ear canal and external ear normal.     Nose: Nose normal.     Mouth/Throat:     Pharynx: Oropharynx is clear.  Eyes:     Conjunctiva/sclera: Conjunctivae normal.  Neck:     Musculoskeletal: Normal range of motion and neck supple.  Cardiovascular:     Rate and Rhythm: Normal rate and regular rhythm.     Pulses: Normal pulses.  Pulmonary:     Effort: Pulmonary effort is normal.     Breath sounds: Normal breath sounds.  Chest:     Breasts:        Right: Normal.  Left: Normal.  Abdominal:     General: Bowel sounds are normal. There is no distension.     Palpations: Abdomen is soft. There is no mass.     Tenderness: There is no abdominal tenderness. There is no guarding or rebound.     Hernia: No hernia is present.  Musculoskeletal:     Right lower leg: No edema.     Left lower leg: No edema.  Skin:    General: Skin is warm.  Neurological:     Mental Status: She is alert and oriented to person, place, and time.  Psychiatric:        Mood and Affect: Mood normal.        Behavior: Behavior normal.        Thought Content: Thought content normal.        Judgment: Judgment normal.       BP (!) 142/78   Pulse 94   Temp 98.3 F (36.8 C)   Ht 5\' 4"  (1.626 m)   Wt 243 lb 12 oz (110.6 kg)   SpO2 96%   BMI 41.84 kg/m  Wt Readings from  Last 3 Encounters:  08/09/18 243 lb 12 oz (110.6 kg)  04/18/18 255 lb (115.7 kg)  03/01/18 250 lb (113.4 kg)       Assessment & Plan:  1. Annual physical exam - Discussed and encouraged healthy lifestyle choices- adequate sleep, regular exercise, stress management and healthy food choices.  - provided number to schedule mammogram  2. Screening for malignant neoplasm of colon - Ambulatory referral to Gastroenterology  3. Morbid obesity with BMI of 40.0-44.9, adult ([place]) - some weight loss, encouraged healthy food choices, adequate protein, water, vegetables.   4. Type 2 diabetes mellitus without complication, without long-term current use of insulin ([place]) - well controlled on metformin, lipids at goal  - follow up in 6 months Clarene Reamer, FNP-BC  [facility], [place]  08/12/2018 6:39 AM

## 2018-08-09 NOTE — Patient Instructions (Addendum)
Please call and schedule an appointment for screening mammogram. A referral is not needed.  [facility]- 336- [phone] [facility]- [phone]  [facility]- [phone] Follow up in 6 months  Health Maintenance, Female Adopting a healthy lifestyle and getting preventive care can go a long way to promote health and wellness. Talk with your health care provider about what schedule of regular examinations is right for you. This is a good chance for you to check in with your provider about disease prevention and staying healthy. In between checkups, there are plenty of things you can do on your own. Experts have done a lot of research about which lifestyle changes and preventive measures are most likely to keep you healthy. Ask your health care provider for more information. Weight and diet Eat a healthy diet  Be sure to include plenty of vegetables, fruits, low-fat dairy products, and lean protein.  Do not eat a lot of foods high in solid fats, added sugars, or salt.  Get regular exercise. This is one of the most important things you can do for your health. ? Most adults should exercise for at least 150 minutes each week. The exercise should increase your heart rate and make you sweat (moderate-intensity exercise). ? Most adults should also do strengthening exercises at least twice a week. This is in addition to the moderate-intensity exercise. Maintain a healthy weight  Body mass index (BMI) is a measurement that can be used to identify possible weight problems. It estimates body fat based on height and weight. Your health care provider can help determine your BMI and help you achieve or maintain a healthy weight.  For females 20 years of age and older: ? A BMI below 18.5 is considered underweight. ? A BMI of 18.5 to 24.9 is normal. ? A BMI of 25 to 29.9 is considered overweight. ? A BMI of 30 and above is considered obese. Watch levels of  cholesterol and blood lipids  You should start having your blood tested for lipids and cholesterol at 52 years of age, then have this test every 5 years.  You may need to have your cholesterol levels checked more often if: ? Your lipid or cholesterol levels are high. ? You are older than 52 years of age. ? You are at high risk for heart disease. Cancer screening Lung Cancer  Lung cancer screening is recommended for adults 55-80 years old who are at high risk for lung cancer because of a history of smoking.  A yearly low-dose CT scan of the lungs is recommended for people who: ? Currently smoke. ? Have quit within the past 15 years. ? Have at least a 30-pack-year history of smoking. A pack year is smoking an average of one pack of cigarettes a day for 1 year.  Yearly screening should continue until it has been 15 years since you quit.  Yearly screening should stop if you develop a health problem that would prevent you from having lung cancer treatment. Breast Cancer  Practice breast self-awareness. This means understanding how your breasts normally appear and feel.  It also means doing regular breast self-exams. Let your health care provider know about any changes, no matter how small.  If you are in your 20s or 30s, you should have a clinical breast exam (CBE) by a health care provider every 1-3 years as part of a regular health exam.  If you are 40 or older, have a CBE every year. Also consider having a   breast X-ray (mammogram) every year.  If you have a family history of breast cancer, talk to your health care provider about genetic screening.  If you are at high risk for breast cancer, talk to your health care provider about having an MRI and a mammogram every year.  Breast cancer gene (BRCA) assessment is recommended for women who have family members with BRCA-related cancers. BRCA-related cancers include: ? Breast. ? Ovarian. ? Tubal. ? Peritoneal cancers.  Results of  the assessment will determine the need for genetic counseling and BRCA1 and BRCA2 testing. Cervical Cancer Your health care provider may recommend that you be screened regularly for cancer of the pelvic organs (ovaries, uterus, and vagina). This screening involves a pelvic examination, including checking for microscopic changes to the surface of your cervix (Pap test). You may be encouraged to have this screening done every 3 years, beginning at age 21.  For women ages 30-65, health care providers may recommend pelvic exams and Pap testing every 3 years, or they may recommend the Pap and pelvic exam, combined with testing for human papilloma virus (HPV), every 5 years. Some types of HPV increase your risk of cervical cancer. Testing for HPV may also be done on women of any age with unclear Pap test results.  Other health care providers may not recommend any screening for nonpregnant women who are considered low risk for pelvic cancer and who do not have symptoms. Ask your health care provider if a screening pelvic exam is right for you.  If you have had past treatment for cervical cancer or a condition that could lead to cancer, you need Pap tests and screening for cancer for at least 20 years after your treatment. If Pap tests have been discontinued, your risk factors (such as having a new sexual partner) need to be reassessed to determine if screening should resume. Some women have medical problems that increase the chance of getting cervical cancer. In these cases, your health care provider may recommend more frequent screening and Pap tests. Colorectal Cancer  This type of cancer can be detected and often prevented.  Routine colorectal cancer screening usually begins at 52 years of age and continues through 52 years of age.  Your health care provider may recommend screening at an earlier age if you have risk factors for colon cancer.  Your health care provider may also recommend using home test  kits to check for hidden blood in the stool.  A small camera at the end of a tube can be used to examine your colon directly (sigmoidoscopy or colonoscopy). This is done to check for the earliest forms of colorectal cancer.  Routine screening usually begins at age 50.  Direct examination of the colon should be repeated every 5-10 years through 52 years of age. However, you may need to be screened more often if early forms of precancerous polyps or small growths are found. Skin Cancer  Check your skin from head to toe regularly.  Tell your health care provider about any new moles or changes in moles, especially if there is a change in a mole's shape or color.  Also tell your health care provider if you have a mole that is larger than the size of a pencil eraser.  Always use sunscreen. Apply sunscreen liberally and repeatedly throughout the day.  Protect yourself by wearing long sleeves, pants, a wide-brimmed hat, and sunglasses whenever you are outside. Heart disease, diabetes, and high blood pressure  High blood pressure causes heart   disease and increases the risk of stroke. High blood pressure is more likely to develop in: ? People who have blood pressure in the high end of the normal range (130-139/85-89 mm Hg). ? People who are overweight or obese. ? People who are African American.  If you are 18-39 years of age, have your blood pressure checked every 3-5 years. If you are 40 years of age or older, have your blood pressure checked every year. You should have your blood pressure measured twice-once when you are at a hospital or clinic, and once when you are not at a hospital or clinic. Record the average of the two measurements. To check your blood pressure when you are not at a hospital or clinic, you can use: ? An automated blood pressure machine at a pharmacy. ? A home blood pressure monitor.  If you are between 55 years and 79 years old, ask your health care provider if you should  take aspirin to prevent strokes.  Have regular diabetes screenings. This involves taking a blood sample to check your fasting blood sugar level. ? If you are at a normal weight and have a low risk for diabetes, have this test once every three years after 52 years of age. ? If you are overweight and have a high risk for diabetes, consider being tested at a younger age or more often. Preventing infection Hepatitis B  If you have a higher risk for hepatitis B, you should be screened for this virus. You are considered at high risk for hepatitis B if: ? You were born in a country where hepatitis B is common. Ask your health care provider which countries are considered high risk. ? Your parents were born in a high-risk country, and you have not been immunized against hepatitis B (hepatitis B vaccine). ? You have HIV or AIDS. ? You use needles to inject street drugs. ? You live with someone who has hepatitis B. ? You have had sex with someone who has hepatitis B. ? You get hemodialysis treatment. ? You take certain medicines for conditions, including cancer, organ transplantation, and autoimmune conditions. Hepatitis C  Blood testing is recommended for: ? Everyone born from 1945 through 1965. ? Anyone with known risk factors for hepatitis C. Sexually transmitted infections (STIs)  You should be screened for sexually transmitted infections (STIs) including gonorrhea and chlamydia if: ? You are sexually active and are younger than 52 years of age. ? You are older than 52 years of age and your health care provider tells you that you are at risk for this type of infection. ? Your sexual activity has changed since you were last screened and you are at an increased risk for chlamydia or gonorrhea. Ask your health care provider if you are at risk.  If you do not have HIV, but are at risk, it may be recommended that you take a prescription medicine daily to prevent HIV infection. This is called  pre-exposure prophylaxis (PrEP). You are considered at risk if: ? You are sexually active and do not regularly use condoms or know the HIV status of your partner(s). ? You take drugs by injection. ? You are sexually active with a partner who has HIV. Talk with your health care provider about whether you are at high risk of being infected with HIV. If you choose to begin PrEP, you should first be tested for HIV. You should then be tested every 3 months for as long as you are taking PrEP.   Pregnancy  If you are premenopausal and you may become pregnant, ask your health care provider about preconception counseling.  If you may become pregnant, take 400 to 800 micrograms (mcg) of folic acid every day.  If you want to prevent pregnancy, talk to your health care provider about birth control (contraception). Osteoporosis and menopause  Osteoporosis is a disease in which the bones lose minerals and strength with aging. This can result in serious bone fractures. Your risk for osteoporosis can be identified using a bone density scan.  If you are 65 years of age or older, or if you are at risk for osteoporosis and fractures, ask your health care provider if you should be screened.  Ask your health care provider whether you should take a calcium or vitamin D supplement to lower your risk for osteoporosis.  Menopause may have certain physical symptoms and risks.  Hormone replacement therapy may reduce some of these symptoms and risks. Talk to your health care provider about whether hormone replacement therapy is right for you. Follow these instructions at home:  Schedule regular health, dental, and eye exams.  Stay current with your immunizations.  Do not use any tobacco products including cigarettes, chewing tobacco, or electronic cigarettes.  If you are pregnant, do not drink alcohol.  If you are breastfeeding, limit how much and how often you drink alcohol.  Limit alcohol intake to no more  than 1 drink per day for nonpregnant women. One drink equals 12 ounces of beer, 5 ounces of wine, or 1 ounces of hard liquor.  Do not use street drugs.  Do not share needles.  Ask your health care provider for help if you need support or information about quitting drugs.  Tell your health care provider if you often feel depressed.  Tell your health care provider if you have ever been abused or do not feel safe at home. This information is not intended to replace advice given to you by your health care provider. Make sure you discuss any questions you have with your health care provider. Document Released: 08/24/2010 Document Revised: 07/17/2015 Document Reviewed: 11/12/2014 Elsevier Interactive Patient Education  2019 Elsevier Inc.  

## 2018-08-12 ENCOUNTER — Encounter: Payer: Self-pay | Admitting: Family Medicine

## 2018-08-14 ENCOUNTER — Other Ambulatory Visit: Payer: Self-pay | Admitting: Family Medicine

## 2018-09-13 ENCOUNTER — Other Ambulatory Visit: Payer: Self-pay | Admitting: Family Medicine

## 2018-09-14 NOTE — Telephone Encounter (Signed)
Last refilled on 06/15/2018 for #90 with 0 refill. LOV 08/09/2018, future appointment 02/07/2019

## 2018-10-09 ENCOUNTER — Encounter: Payer: Self-pay | Admitting: Pain Medicine

## 2018-10-09 NOTE — Progress Notes (Signed)
Pain Management Virtual Encounter Note - Virtual Visit via Telephone Telehealth (real-time audio visits between healthcare provider and patient).   Patient's Phone No. & Preferred Pharmacy:  [phone] (home); [phone] (mobile); (Preferred) [phone] [email or web address]  [id number] - [address], [place] [address] [place] [address] Phone: [phone] Fax: [phone]    Pre-screening note:  Our staff contacted Ms. Bero and offered her an "in person", "face-to-face" appointment versus a telephone encounter. She indicated preferring the telephone encounter, at this time.   Reason for Virtual Visit: COVID-19*  Social distancing based on CDC and AMA recommendations.   I contacted Tamala Julian on 10/10/2018 via telephone.      I clearly identified myself as Gaspar Cola, MD. I verified that I was speaking with the correct person using two identifiers (Name: LOREY PALLETT, and date of birth: Jun 17, 1965).  Advanced Informed Consent I sought verbal advanced consent from Tamala Julian for virtual visit interactions. I informed Ms. Baez of possible security and privacy concerns, risks, and limitations associated with providing "not-in-person" medical evaluation and management services. I also informed Ms. Brouhard of the availability of "in-person" appointments. Finally, I informed her that there would be a charge for the virtual visit and that she could be  personally, fully or partially, financially responsible for it. Ms. Creek expressed understanding and agreed to proceed.   Historic Elements   [name] is a 53 y.o. year old, female patient evaluated today after her last encounter by our practice on 07/20/2018. Ms. Volkert  has a past medical history of Anxiety, Asthma, Bulging lumbar disc (L3-4) (02/26/2015), Degenerative disc disease, Degenerative lumbar disc,  Depression, Diabetes mellitus, Elective abortion, GERD (gastroesophageal reflux disease), Hypertension, Stroke ([place]) (06/11/2015), and Vaginal delivery. She also  has a past surgical history that includes Cholecystectomy (2001); Lapband (04/2012); Wisdom tooth extraction; Dilatation & curettage/hysteroscopy with myosure (N/A, 09/10/2014); mirena; and lap band surgery. Ms. Bumpus has a current medication list which includes the following prescription(s): albuterol, atorvastatin, bupropion, cetirizine, fluticasone, hydrocodone-acetaminophen, hydrocodone-acetaminophen, hydrocodone-acetaminophen, lamotrigine, lorazepam, metaxalone, metformin, montelukast, paroxetine, ra aspirin ec adult low st, spironolactone, and vyvanse. She  reports that she has never smoked. She has never used smokeless tobacco. She reports current alcohol use. She reports that she does not use drugs. Ms. Zuch is allergic to oxycodone; penicillins; red dye; and sulfa antibiotics.   HPI  Today, she is being contacted for medication management.  Pharmacotherapy Assessment  Analgesic: Hydrocodone/APAP 5/325, 2 tab PO QD (10 mg/day of hydrocodone) MME/day:10 mg/day.   Monitoring: Pharmacotherapy: No side-effects or adverse reactions reported. [place] PMP: PDMP reviewed during this encounter.       Compliance: No problems identified. Effectiveness: Clinically acceptable. Plan: Refer to "POC".  UDS:  Summary  Date Value Ref Range Status  04/18/2018 FINAL  Final    Comment:    ==================================================================== TOXASSURE SELECT 13 (MW) ==================================================================== Test                             Result       Flag       Units Drug Present and Declared for Prescription Verification   Amphetamine                    1499         EXPECTED   ng/mg creat  Amphetamine is available as a schedule II prescription drug.   Lorazepam                      731           EXPECTED   ng/mg creat    Source of lorazepam is a scheduled prescription medication.   Hydrocodone                    536          EXPECTED   ng/mg creat   Dihydrocodeine                 96           EXPECTED   ng/mg creat   Norhydrocodone                 666          EXPECTED   ng/mg creat    Sources of hydrocodone include scheduled prescription    medications. Dihydrocodeine and norhydrocodone are expected    metabolites of hydrocodone. Dihydrocodeine is also available as a    scheduled prescription medication. ==================================================================== Test                      Result    Flag   Units      Ref Range   Creatinine              95               mg/dL      >=81>=20 ==================================================================== Declared Medications:  The flagging and interpretation on this report are based on the  following declared medications.  Unexpected results may arise from  inaccuracies in the declared medications.  **Note: The testing scope of this panel includes these medications:  Amphetamine (Lisdexamfetamine)  Hydrocodone (Hydrocodone-Acetaminophen)  Lorazepam  **Note: The testing scope of this panel does not include following  reported medications:  Acetaminophen (Hydrocodone-Acetaminophen)  Albuterol  Aspirin (Aspirin 81)  Atorvastatin  Bupropion  Cetirizine  Fluticasone  Ibuprofen  Lamotrigine  Metaxalone  Metformin  Montelukast  Paroxetine  Spironolactone ==================================================================== For clinical consultation, please call [phone]. ====================================================================    Laboratory Chemistry Profile (12 mo)  Renal: 07/31/2018: BUN 14; Creatinine, Ser 0.76  Lab Results  Component Value Date   GFRAA >60 12/16/2017   GFRNONAA >60 12/16/2017   Hepatic: 07/31/2018: Albumin 4.1 Lab Results  Component Value Date   AST 14 07/31/2018   ALT 14  07/31/2018   Other: No results found for requested labs within last 8760 hours. Note: Above Lab results reviewed.  Imaging  Last 90 days:  No results found.  Assessment  The primary encounter diagnosis was Chronic pain syndrome. Diagnoses of Chronic low back pain (Primary Area of Pain) (Bilateral) (L>R), Chronic lower extremity pain (Secondary area of Pain) (Left), Muscle spasm, nocturnal, and Musculoskeletal pain were also pertinent to this visit.  Plan of Care  I have discontinued Tamelia D. Weiher's HYDROcodone-acetaminophen and HYDROcodone-acetaminophen. I have also changed her metaxalone and HYDROcodone-acetaminophen. Additionally, I am having her start on HYDROcodone-acetaminophen and HYDROcodone-acetaminophen. Lastly, I am having her maintain her buPROPion, lamoTRIgine, LORazepam, PARoxetine, cetirizine, RA Aspirin EC Adult Low St, albuterol, Vyvanse, montelukast, atorvastatin, fluticasone, metFORMIN, and spironolactone.  Pharmacotherapy (Medications Ordered): Meds ordered this encounter  Medications  . metaxalone (SKELAXIN) 800 MG tablet    Sig: Take 1 tablet (800 mg total) by mouth 3 (three)  times daily. Max: 3/day    Dispense:  117 tablet    Refill:  2    Fill one day early if pharmacy is closed on scheduled refill date. May substitute for generic if available.  Marland Kitchen. HYDROcodone-acetaminophen (NORCO/VICODIN) 5-325 MG tablet    Sig: Take 0.5-1 tablets by mouth 2 (two) times daily as needed for severe pain. Must last 30 days    Dispense:  60 tablet    Refill:  0    Chronic Pain: STOP Act (Not applicable) Fill 1 day early if closed on refill date. Do not fill until: 11/14/2018. To last until: 12/14/2018. Avoid benzodiazepines within 8 hours of opioids  . HYDROcodone-acetaminophen (NORCO/VICODIN) 5-325 MG tablet    Sig: Take 0.5-1 tablets by mouth 2 (two) times daily as needed for severe pain. Must last 30 days    Dispense:  60 tablet    Refill:  0    Chronic Pain: STOP Act (Not  applicable) Fill 1 day early if closed on refill date. Do not fill until: 12/14/2018. To last until: 01/13/2019. Avoid benzodiazepines within 8 hours of opioids  . HYDROcodone-acetaminophen (NORCO/VICODIN) 5-325 MG tablet    Sig: Take 0.5-1 tablets by mouth 2 (two) times daily as needed for severe pain. Must last 30 days    Dispense:  60 tablet    Refill:  0    Chronic Pain: STOP Act (Not applicable) Fill 1 day early if closed on refill date. Do not fill until: 01/13/2019. To last until: 02/12/2019. Avoid benzodiazepines within 8 hours of opioids   Orders:  No orders of the defined types were placed in this encounter.  Follow-up plan:   Return in about 4 months (around 02/12/2019) for (VV), E/M (MM).      Interventional management options: Planned, scheduled, and/or pending:   None at this time secondary to the patient's body habitus (BMI=42). She needs to bring it down to 30. We may consider interventions once she is back into the 30's. If the patient's lower extremity symptoms worsen, we will consider repeating her lumbar MRI since the last one on record is from 2013.   Considering:   Diagnostic bilateral lumbar facet block  Possible bilateral lumbar facet RFA.  Diagnostic left L3-4 interlaminar LESI  Diagnostic left-sided L3-4 transforaminal LESI  Diagnostic left-sided L4-5 transforaminal LESI  Diagnostic bilateral L5-S1 transforaminal LESI  Diagnostic left-sided cervical epidural steroid injection  Diagnostic bilateral cervical facet block  Possible bilateral cervical facet RFA.    Palliative PRN treatment(s):   None at this time    Recent Visits Date Type Provider Dept  07/20/18 Office Visit Delano MetzNaveira, Azuri Bozard, MD [facility]  Showing recent visits within past 90 days and meeting all other requirements   Today's Visits Date Type Provider Dept  10/10/18 Office Visit Delano MetzNaveira, Alleigh Mollica, MD [facility]  Showing today's visits and meeting all other  requirements   Future Appointments No visits were found meeting these conditions.  Showing future appointments within next 90 days and meeting all other requirements   I discussed the assessment and treatment plan with the patient. The patient was provided an opportunity to ask questions and all were answered. The patient agreed with the plan and demonstrated an understanding of the instructions.  Patient advised to call back or seek an in-person evaluation if the symptoms or condition worsens.  Total duration of non-face-to-face encounter: 12 minutes.  Note by: Oswaldo DoneFrancisco A Thurman Sarver, MD Date: 10/10/2018; Time: 4:47 PM  Note: This dictation was  prepared with [employer] . Any transcriptional errors that may result from this process are unintentional.  Disclaimer:  * Given the special circumstances of the COVID-19 pandemic, the federal government has announced that the Office for Civil Rights (OCR) will exercise its enforcement discretion and will not impose penalties on physicians using telehealth in the event of noncompliance with regulatory requirements under the [place] and [place] (HIPAA) in connection with the good faith provision of telehealth during the BULAG-53 national public health emergency. ([place])

## 2018-10-10 ENCOUNTER — Ambulatory Visit: Payer: Managed Care, Other (non HMO) | Attending: Pain Medicine | Admitting: Pain Medicine

## 2018-10-10 ENCOUNTER — Other Ambulatory Visit: Payer: Self-pay

## 2018-10-10 DIAGNOSIS — M7918 Myalgia, other site: Secondary | ICD-10-CM

## 2018-10-10 DIAGNOSIS — G8929 Other chronic pain: Secondary | ICD-10-CM

## 2018-10-10 DIAGNOSIS — M62838 Other muscle spasm: Secondary | ICD-10-CM | POA: Diagnosis not present

## 2018-10-10 DIAGNOSIS — M545 Low back pain, unspecified: Secondary | ICD-10-CM

## 2018-10-10 DIAGNOSIS — M79605 Pain in left leg: Secondary | ICD-10-CM

## 2018-10-10 DIAGNOSIS — G894 Chronic pain syndrome: Secondary | ICD-10-CM | POA: Diagnosis not present

## 2018-10-10 MED ORDER — HYDROCODONE-ACETAMINOPHEN 5-325 MG PO TABS: 0.5000 | ORAL_TABLET | Freq: Two times a day (BID) | ORAL | 0 refills | Status: DC | PRN

## 2018-10-10 MED ORDER — METAXALONE 800 MG PO TABS: 800.0000 mg | ORAL_TABLET | Freq: Three times a day (TID) | ORAL | 2 refills | Status: DC

## 2018-10-18 ENCOUNTER — Ambulatory Visit: Payer: Managed Care, Other (non HMO) | Admitting: Pain Medicine

## 2018-10-27 ENCOUNTER — Telehealth: Payer: Self-pay

## 2018-10-27 ENCOUNTER — Telehealth: Payer: Self-pay | Admitting: Pain Medicine

## 2018-10-27 NOTE — Telephone Encounter (Signed)
Patient called at 8:18 lvmail stating her medications should have been able to be filled on 10-26-18. But the fill date on script at pharmacy is for 9-22. She is out of medications that she takes every day. Please check her meds and let her know solution.

## 2018-10-27 NOTE — Telephone Encounter (Signed)
Spoke with pharmacy and they states they do have the prescription to be filled on 10-15-18.  Patient notified.

## 2018-10-27 NOTE — Telephone Encounter (Signed)
Called patient back and left message with  her that it looked like her script dates were correct since June.   Informed her to call us back if she has any further questions.

## 2018-10-31 ENCOUNTER — Other Ambulatory Visit: Payer: Self-pay | Admitting: Family Medicine

## 2018-11-01 ENCOUNTER — Encounter: Payer: Self-pay | Admitting: Gynecology

## 2018-11-15 ENCOUNTER — Encounter: Payer: Self-pay | Admitting: Gynecology

## 2018-11-26 ENCOUNTER — Other Ambulatory Visit: Payer: Self-pay | Admitting: Family Medicine

## 2019-01-15 ENCOUNTER — Telehealth: Payer: Self-pay | Admitting: Family Medicine

## 2019-01-15 LAB — HM DIABETES EYE EXAM

## 2019-01-15 NOTE — Telephone Encounter (Signed)
Last CPE 08/09/2018  Aldactone last filled 09/15/2018 #90 x 1 refill Singlulair last refilled 11/29/2018 #90 x 0 refills  Due back for follow up Dec 2020  Please advise, thanks.

## 2019-01-15 NOTE — Telephone Encounter (Signed)
Please call and check with pharmacy as to why she has no more refills since according to our records, she should have plenty.

## 2019-01-15 NOTE — Telephone Encounter (Signed)
Patient is requesting a refill She thought refills were sent at physical but she does not have any for the following  [place]

## 2019-01-16 NOTE — Telephone Encounter (Signed)
Left a detailed message requesting a call back Advised to contact her pharmacy to see if they have refills on file to process.

## 2019-01-16 NOTE — Telephone Encounter (Signed)
Spoke with pharmacy - there are refills on file and they are ready to be picked up.  Pt aware to contact pharmacy.   Nothing further needed.

## 2019-02-06 ENCOUNTER — Encounter: Payer: Self-pay | Admitting: Pain Medicine

## 2019-02-06 NOTE — Progress Notes (Signed)
Disclaimer: In compliance with Federal Rules mandating open notes, implemented by the [employer] Act, these notes are made available to patients through the electronic medical record. Information contained herein reflects the providers review of information obtained during the pre-charting review of records, as well as notes taken during the patients appointment.  Although all data contained herein was reviewed by the provider, unless specifically stated, due to time constrains, not all of it was discussed with the patient during the appointment. Specific medical language and abbreviations used within medical records is meant to communicate precise data to individuals trained to interpret such data.  Warning: These encounter notes are not meant to serve the purpose of providing patients with clear and concise information on their conditions, treatment plan, or specific risks and possible complications. Such information is provided to patients under the encounter's "Patient Information" section. Interpretation of the information contained herein should be left to individuals with the necessary training to understand the data.  Pain Management Virtual Encounter Note - Virtual Visit via Telephone Telehealth (real-time audio visits between healthcare provider and patient).   Patient's Phone No. & Preferred Pharmacy:  [phone] (home); [phone] (mobile); (Preferred) [phone] [email or web address]  [id number] - [place], [place] - [address] AT [facility] [address] [facility] [address] [place] [address] Phone: [phone] Fax: [phone]    Pre-screening note:  Our staff contacted Debra Myers and offered her an "in person", "face-to-face" appointment versus a telephone encounter. She indicated preferring the telephone encounter, at this time.   Reason for Virtual Visit: COVID-19*  Social distancing based on CDC and AMA  recommendations.   I contacted Debra Myers on 02/07/2019 via telephone.      I clearly identified myself as Oswaldo Done, MD. I verified that I was speaking with the correct person using two identifiers (Name: Debra Myers, and date of birth: 08-26-1965).  Advanced Informed Consent I sought verbal advanced consent from Debra Myers for virtual visit interactions. I informed Debra Myers of possible security and privacy concerns, risks, and limitations associated with providing "not-in-person" medical evaluation and management services. I also informed Debra Myers of the availability of "in-person" appointments. Finally, I informed her that there would be a charge for the virtual visit and that she could be  personally, fully or partially, financially responsible for it. Debra Myers expressed understanding and agreed to proceed.   Historic Elements   Debra Myers is a 53 y.o. year old, female patient evaluated today after her last encounter by our practice on 10/27/2018. Debra Myers  has a past medical history of Anxiety, Asthma, Bulging lumbar disc (L3-4) (02/26/2015), Degenerative disc disease, Degenerative lumbar disc, Depression, Diabetes mellitus, Elective abortion, GERD (gastroesophageal reflux disease), Hypertension, Stroke (HCC) (06/11/2015), and Vaginal delivery. She also  has a past surgical history that includes Cholecystectomy (2001); Lapband (04/2012); Wisdom tooth extraction; Dilatation & curettage/hysteroscopy with myosure (N/A, 09/10/2014); mirena; and lap band surgery. Debra Myers has a current medication list which includes the following prescription(s): albuterol, atorvastatin, bupropion, cetirizine, fluticasone, hydrocodone-acetaminophen, ibuprofen, lamotrigine, lorazepam, metaxalone, metformin, montelukast, paroxetine, ra aspirin ec adult low st, spironolactone, vyvanse, hydrocodone-acetaminophen, and hydrocodone-acetaminophen. She  reports that she has never smoked. She has never used  smokeless tobacco. She reports current alcohol use. She reports that she does not use drugs. Debra Myers is allergic to oxycodone; penicillins; red dye; and sulfa antibiotics.   HPI  Today, she is being contacted for medication  management.  The patient indicates doing well with the current medication regimen. No adverse reactions or side effects reported to the medications.  The patient refers having lost approximately 30 pounds since the last time that I saw her.  She also indicates having some low back pain in the midline and both sides with the right side being worse than the left, which she pointed out that it is the opposite of what she normally has.  She indicated that she has 42 hydrocodone pills left.  Today I have offered her a facet block with possible radiofrequency ablation should the facet blocks help, but she indicated that she has "heard that it is painful".  She asked about the possibility of doing some Toradol/Norflex IM injection since the last 1 seems to have provided her with some relief of the pain and has helped her break the cycle of pain whenever she has a flareup.  I told her that it would be okay to have those at the clinic as long as her kidney and liver function are intact.  I also told her that for those all she would need to do is contact our nursing staff and they can bring her in on a "nurse visit" to do the injection.  I also reminded her that we would probably not do those any sooner than every other week.  If we had to do them regularly, she should then reconsider doing the lumbar facet blocks and possibly radiofrequency ablation to attain longer lasting benefit.  Pharmacotherapy Assessment  Analgesic: Hydrocodone/APAP 5/325, 2 tab PO QD (10 mg/day of hydrocodone) MME/day:10 mg/day.   Monitoring: Pharmacotherapy: No side-effects or adverse reactions reported. [place] PMP: PDMP reviewed during this encounter.       Compliance: No problems identified. Effectiveness:  Clinically acceptable. Plan: Refer to "POC".  UDS:  Summary  Date Value Ref Range Status  04/18/2018 FINAL  Final    Comment:    ==================================================================== TOXASSURE SELECT 13 (MW) ==================================================================== Test                             Result       Flag       Units Drug Present and Declared for Prescription Verification   Amphetamine                    1499         EXPECTED   ng/mg creat    Amphetamine is available as a schedule II prescription drug.   Lorazepam                      731          EXPECTED   ng/mg creat    Source of lorazepam is a scheduled prescription medication.   Hydrocodone                    536          EXPECTED   ng/mg creat   Dihydrocodeine                 96           EXPECTED   ng/mg creat   Norhydrocodone                 666          EXPECTED   ng/mg creat    Sources of hydrocodone  include scheduled prescription    medications. Dihydrocodeine and norhydrocodone are expected    metabolites of hydrocodone. Dihydrocodeine is also available as a    scheduled prescription medication. ==================================================================== Test                      Result    Flag   Units      Ref Range   Creatinine              95               mg/dL      >=16 ==================================================================== Declared Medications:  The flagging and interpretation on this report are based on the  following declared medications.  Unexpected results may arise from  inaccuracies in the declared medications.  **Note: The testing scope of this panel includes these medications:  Amphetamine (Lisdexamfetamine)  Hydrocodone (Hydrocodone-Acetaminophen)  Lorazepam  **Note: The testing scope of this panel does not include following  reported medications:  Acetaminophen (Hydrocodone-Acetaminophen)  Albuterol  Aspirin (Aspirin 81)  Atorvastatin   Bupropion  Cetirizine  Fluticasone  Ibuprofen  Lamotrigine  Metaxalone  Metformin  Montelukast  Paroxetine  Spironolactone ==================================================================== For clinical consultation, please call [phone]. ====================================================================    Laboratory Chemistry Profile (12 mo)  Renal: 07/31/2018: BUN 14; Creatinine, Ser 0.76  Lab Results  Component Value Date   GFR 79.91 07/31/2018   GFRAA >60 12/16/2017   GFRNONAA >60 12/16/2017   Hepatic: 07/31/2018: Albumin 4.1 Lab Results  Component Value Date   AST 14 07/31/2018   ALT 14 07/31/2018   Other: No results found for requested labs within last 8760 hours. Note: Above Lab results reviewed.  Imaging  VAS [name] LOWER EXTREMITY VENOUS (DVT) (MC and WL 7a-7p)  Lower Venous Study  Indications: Pain.   Performing Technologist: Gertie Fey MHA, RDMS, RVT, RDCS    Examination Guidelines: A complete evaluation includes B-mode imaging, spectral Doppler, color Doppler, and power Doppler as needed of all accessible portions of each vessel. Bilateral testing is considered an integral part of a complete examination. Limited examinations for reoccurring indications may be performed as noted.    Right Venous Findings: +---+---------------+---------+-----------+----------+-------+    CompressibilityPhasicitySpontaneityPropertiesSummary +---+---------------+---------+-----------+----------+-------+ CFVFull           Yes      Yes                          +---+---------------+---------+-----------+----------+-------+     Left Venous Findings: +---------+---------------+---------+-----------+----------+-------+          CompressibilityPhasicitySpontaneityPropertiesSummary +---------+---------------+---------+-----------+----------+-------+ CFV      Full           Yes      Yes                           +---------+---------------+---------+-----------+----------+-------+ SFJ      Full                                                 +---------+---------------+---------+-----------+----------+-------+ FV Prox  Full                                                 +---------+---------------+---------+-----------+----------+-------+  FV Mid   Full                                                 +---------+---------------+---------+-----------+----------+-------+ FV DistalFull                                                 +---------+---------------+---------+-----------+----------+-------+ PFV      Full                                                 +---------+---------------+---------+-----------+----------+-------+ POP      Full           Yes      Yes                          +---------+---------------+---------+-----------+----------+-------+ PTV      Full                                                 +---------+---------------+---------+-----------+----------+-------+ PERO     Full                                                 +---------+---------------+---------+-----------+----------+-------+          Summary: Right: No evidence of common femoral vein obstruction. Left: There is no evidence of deep vein thrombosis in the lower extremity. No cystic structure found in the popliteal fossa.   *See table(s) above for measurements and observations.  Electronically signed by Monica Martinez MD on 12/16/2017 at 6:04:46 PM.      Final     Assessment  There were no encounter diagnoses.  Plan of Care  Problem-specific:  No problem-specific Assessment & Plan notes found for this encounter.  I am having Tamala Julian maintain her buPROPion, lamoTRIgine, LORazepam, PARoxetine, cetirizine, RA Aspirin EC Adult Low St, Vyvanse, fluticasone, metFORMIN, spironolactone, metaxalone, HYDROcodone-acetaminophen,  HYDROcodone-acetaminophen, HYDROcodone-acetaminophen, albuterol, montelukast, atorvastatin, and ibuprofen.  Pharmacotherapy (Medications Ordered): No orders of the defined types were placed in this encounter.  Orders:  No orders of the defined types were placed in this encounter.  Follow-up plan:   No follow-ups on file.      Interventional management options: Planned, scheduled, and/or pending:      Considering:   Diagnostic bilateral lumbar facet block  Possible bilateral lumbar facet RFA.  Diagnostic left L3-4 LESI  Diagnostic left L3 TFESI  Diagnostic left L4 TFESI  Diagnostic bilateral L5 TFESI  Diagnostic left CESI  Diagnostic bilateral cervical facet block  Possible bilateral cervical facet RFA.    Palliative PRN treatment(s):   None at this time    Recent Visits No visits were found meeting these conditions.  Showing recent visits within past 90 days and meeting all other requirements   Future Appointments Date Type Provider Dept  02/07/19 Telemedicine Delano Metz, MD [facility]  Showing future appointments within next 90 days and meeting all other requirements   I discussed the assessment and treatment plan with the patient. The patient was provided an opportunity to ask questions and all were answered. The patient agreed with the plan and demonstrated an understanding of the instructions.  Patient advised to call back or seek an in-person evaluation if the symptoms or condition worsens.  Total duration of non-face-to-face encounter: 22 minutes.  Note by: Oswaldo Done, MD Date: 02/07/2019; Time: 9:18 PM  Note: This dictation was prepared with Dragon dictation. Any transcriptional errors that may result from this process are unintentional.

## 2019-02-07 ENCOUNTER — Ambulatory Visit: Payer: Managed Care, Other (non HMO) | Attending: Pain Medicine | Admitting: Pain Medicine

## 2019-02-07 ENCOUNTER — Other Ambulatory Visit: Payer: Self-pay

## 2019-02-07 ENCOUNTER — Encounter: Payer: Self-pay | Admitting: Family Medicine

## 2019-02-07 ENCOUNTER — Ambulatory Visit (INDEPENDENT_AMBULATORY_CARE_PROVIDER_SITE_OTHER): Payer: Managed Care, Other (non HMO) | Admitting: Family Medicine

## 2019-02-07 VITALS — Temp 97.0°F | Ht 64.0 in | Wt 227.0 lb

## 2019-02-07 DIAGNOSIS — M79605 Pain in left leg: Secondary | ICD-10-CM

## 2019-02-07 DIAGNOSIS — M47812 Spondylosis without myelopathy or radiculopathy, cervical region: Secondary | ICD-10-CM

## 2019-02-07 DIAGNOSIS — R438 Other disturbances of smell and taste: Secondary | ICD-10-CM | POA: Diagnosis not present

## 2019-02-07 DIAGNOSIS — E559 Vitamin D deficiency, unspecified: Secondary | ICD-10-CM | POA: Diagnosis not present

## 2019-02-07 DIAGNOSIS — M47816 Spondylosis without myelopathy or radiculopathy, lumbar region: Secondary | ICD-10-CM

## 2019-02-07 DIAGNOSIS — G8929 Other chronic pain: Secondary | ICD-10-CM

## 2019-02-07 DIAGNOSIS — R634 Abnormal weight loss: Secondary | ICD-10-CM | POA: Diagnosis not present

## 2019-02-07 DIAGNOSIS — M545 Low back pain: Secondary | ICD-10-CM

## 2019-02-07 DIAGNOSIS — E119 Type 2 diabetes mellitus without complications: Secondary | ICD-10-CM

## 2019-02-07 DIAGNOSIS — M62838 Other muscle spasm: Secondary | ICD-10-CM

## 2019-02-07 DIAGNOSIS — M542 Cervicalgia: Secondary | ICD-10-CM | POA: Diagnosis not present

## 2019-02-07 DIAGNOSIS — M7918 Myalgia, other site: Secondary | ICD-10-CM

## 2019-02-07 DIAGNOSIS — G894 Chronic pain syndrome: Secondary | ICD-10-CM | POA: Diagnosis not present

## 2019-02-07 MED ORDER — METAXALONE 800 MG PO TABS: 800.0000 mg | ORAL_TABLET | Freq: Three times a day (TID) | ORAL | 5 refills | Status: DC | PRN

## 2019-02-07 MED ORDER — HYDROCODONE-ACETAMINOPHEN 5-325 MG PO TABS: 0.5000 | ORAL_TABLET | Freq: Two times a day (BID) | ORAL | 0 refills | Status: DC | PRN

## 2019-02-07 NOTE — Patient Instructions (Signed)

## 2019-02-07 NOTE — Progress Notes (Signed)
Virtual Visit via Video Note  I connected with Debra Myers on 02/07/19 at  2:30 PM EST by a video enabled telemedicine application and verified that I am speaking with the correct person using two identifiers.  Location: Patient: In her home Provider: Whitney Persons participating in virtual visit-patient and provider   I discussed the limitations of evaluation and management by telemedicine and the availability of in person appointments. The patient expressed understanding and agreed to proceed.  History of Present Illness: Chief Complaint  Patient presents with  . Follow-up    19mo follow up - concerning weight loss, some change in appetite   This is a 53 yo female who presents today for 6 month follow up of chronic medical conditions.   Diabetes mellitus type 2-she has not been checking her blood sugars.  Last hemoglobin A1c 07/31/2018 was 6.3.  She is minimally treated with Metformin 500 mg once a day.  Weight loss-she has lost about 30 pounds since the beginning of this year.  She reports that she has had a decreased appetite for several months with some decreased sense of smell.  She has had some changes in her diet as her daughter has been avoiding gluten and dairy.  She has occasional early satiety which she attributes to her lap band.  She denies any changes in her bowel habits.  Mood-she has generally been doing well despite pandemic.  She was very busy during election season, volunteering at the [employer].  She enjoys this activity.  Past Medical History:  Diagnosis Date  . Anxiety   . Asthma    ALLERGY INDUCED  . Bulging lumbar disc (L3-4) 02/26/2015  . Degenerative disc disease   . Degenerative lumbar disc   . Depression   . Diabetes mellitus    TYPE 2  . Elective abortion    ONE  . GERD (gastroesophageal reflux disease)    takes prilosec   . Hypertension   . Stroke ([place]) 06/11/2015   patient describes as mini stroke  . Vaginal delivery    ONE NSVD    Past Surgical History:  Procedure Laterality Date  . CHOLECYSTECTOMY  2001  . DILATATION & CURETTAGE/HYSTEROSCOPY WITH MYOSURE N/A 09/10/2014   Procedure: DILATATION & CURETTAGE/HYSTEROSCOPY WITH MYOSURE/INSERTION OF IUD;  Surgeon: Terrance Mass, MD;  Location: [facility];  Service: Gynecology;  Laterality: N/A;  . lap band surgery    . Lapband  04/2012  . mirena     inserted 09-2014  . WISDOM TOOTH EXTRACTION     Family History  Problem Relation Age of Onset  . Hypertension Father   . Diabetes Father   . Hypertension Mother   . Asthma Maternal Grandmother   . Stroke Maternal Grandmother   . Stroke Maternal Grandfather    Social History   Tobacco Use  . Smoking status: Never Smoker  . Smokeless tobacco: Never Used  Substance Use Topics  . Alcohol use: Yes    Alcohol/week: 0.0 standard drinks    Comment: rarely  . Drug use: No        Observations/Objective: Patient is alert and answers questions appropriately.  Visible skin is unremarkable.  Respirations are even and unlabored.  She is normally conversive without audible wheeze or witnessed cough.  Mood and affect are appropriate. Temp (!) 97 F (36.1 C) (Temporal)   Ht 5\' 4"  (1.626 m)   Wt 227 lb (103 kg)   BMI 38.96 kg/m  Wt Readings from Last 3 Encounters:  02/07/19 227 lb (103 kg)  08/09/18 243 lb 12 oz (110.6 kg)  04/18/18 255 lb (115.7 kg)    Assessment and Plan: 1. Type 2 diabetes mellitus without complication, without long-term current use of insulin (HCC) - Hemoglobin A1c; Future  2. Unintended weight loss - Patient agrees to schedule lab appointment for this week.  - She has had some dietary changes but not sure if enough to explain weight loss. If lab results negative, will follow up with chest xray, referral to GI (overdue colonoscopy) - CBC with Differential; Future - Comprehensive metabolic panel; Future - TSH; Future - Hemoglobin A1c; Future - CRP High sensitivity; Future - HIV antibody;  Future - Urinalysis with Culture, if indicated; Future  3. Decreased sense of smell - she and daughter were briefly ill earlier this year, has noticed decreased sense of smell, will check Covid antibodies - SARS CoV2 Serology(COVID19) AB(IgG,IgM),Immunoassay; Future  4. Vitamin D deficiency - Vitamin D, 25-hydroxy; Future   Debra Myers, Debra Myers  [facility], [facility]  02/07/2019 3:03 PM   Follow Up Instructions:    I discussed the assessment and treatment plan with the patient. The patient was provided an opportunity to ask questions and all were answered. The patient agreed with the plan and demonstrated an understanding of the instructions.   The patient was advised to call back or seek an in-person evaluation if the symptoms worsen or if the condition fails to improve as anticipated.   Debra Belfast, FNP

## 2019-02-08 ENCOUNTER — Other Ambulatory Visit (INDEPENDENT_AMBULATORY_CARE_PROVIDER_SITE_OTHER): Payer: Managed Care, Other (non HMO)

## 2019-02-08 ENCOUNTER — Other Ambulatory Visit: Payer: Self-pay

## 2019-02-08 DIAGNOSIS — R634 Abnormal weight loss: Secondary | ICD-10-CM

## 2019-02-08 DIAGNOSIS — R438 Other disturbances of smell and taste: Secondary | ICD-10-CM

## 2019-02-08 DIAGNOSIS — E559 Vitamin D deficiency, unspecified: Secondary | ICD-10-CM

## 2019-02-08 DIAGNOSIS — E119 Type 2 diabetes mellitus without complications: Secondary | ICD-10-CM | POA: Diagnosis not present

## 2019-02-09 LAB — CBC WITH DIFFERENTIAL/PLATELET
Basophils Absolute: 0.1 10*3/uL (ref 0.0–0.1)
Basophils Relative: 1.2 % (ref 0.0–3.0)
Eosinophils Absolute: 0.2 10*3/uL (ref 0.0–0.7)
Eosinophils Relative: 1.8 % (ref 0.0–5.0)
HCT: 39.6 % (ref 36.0–46.0)
Hemoglobin: 13.2 g/dL (ref 12.0–15.0)
Lymphocytes Relative: 24.1 % (ref 12.0–46.0)
Lymphs Abs: 2.2 10*3/uL (ref 0.7–4.0)
MCHC: 33.3 g/dL (ref 30.0–36.0)
MCV: 87.9 fl (ref 78.0–100.0)
Monocytes Absolute: 0.4 10*3/uL (ref 0.1–1.0)
Monocytes Relative: 3.8 % (ref 3.0–12.0)
Neutro Abs: 6.4 10*3/uL (ref 1.4–7.7)
Neutrophils Relative %: 69.1 % (ref 43.0–77.0)
Platelets: 241 10*3/uL (ref 150.0–400.0)
RBC: 4.5 Mil/uL (ref 3.87–5.11)
RDW: 13.4 % (ref 11.5–15.5)
WBC: 9.2 10*3/uL (ref 4.0–10.5)

## 2019-02-09 LAB — COMPREHENSIVE METABOLIC PANEL
ALT: 15 U/L (ref 0–35)
AST: 14 U/L (ref 0–37)
Albumin: 4.4 g/dL (ref 3.5–5.2)
Alkaline Phosphatase: 108 U/L (ref 39–117)
BUN: 13 mg/dL (ref 6–23)
CO2: 29 mEq/L (ref 19–32)
Calcium: 9.1 mg/dL (ref 8.4–10.5)
Chloride: 104 mEq/L (ref 96–112)
Creatinine, Ser: 0.99 mg/dL (ref 0.40–1.20)
GFR: 58.78 mL/min — ABNORMAL LOW (ref >60.00)
Glucose, Bld: 118 mg/dL — ABNORMAL HIGH (ref 70–99)
Potassium: 4.1 mEq/L (ref 3.5–5.1)
Sodium: 139 mEq/L (ref 135–145)
Total Bilirubin: 0.4 mg/dL (ref 0.2–1.2)
Total Protein: 6.8 g/dL (ref 6.0–8.3)

## 2019-02-09 LAB — URINALYSIS WITH CULTURE, IF INDICATED
Bilirubin Urine: NEGATIVE
Hgb urine dipstick: NEGATIVE
Ketones, ur: NEGATIVE
Leukocytes,Ua: NEGATIVE
Nitrite: NEGATIVE
RBC / HPF: NONE SEEN (ref 0–?)
Specific Gravity, Urine: 1.025 (ref 1.000–1.030)
Total Protein, Urine: NEGATIVE
Urine Glucose: NEGATIVE
Urobilinogen, UA: 0.2 (ref 0.0–1.0)
WBC, UA: NONE SEEN (ref 0–?)
pH: 6 (ref 5.0–8.0)

## 2019-02-09 LAB — SARS COV-2 SEROLOGY(COVID-19)AB(IGG,IGM),IMMUNOASSAY
SARS CoV-2 AB IgG: NEGATIVE
SARS CoV-2 IgM: NEGATIVE

## 2019-02-09 LAB — VITAMIN D 25 HYDROXY (VIT D DEFICIENCY, FRACTURES): VITD: 31.95 ng/mL (ref 30.00–100.00)

## 2019-02-09 LAB — HIGH SENSITIVITY CRP: CRP, High Sensitivity: 5.42 mg/L — ABNORMAL HIGH (ref 0.000–5.000)

## 2019-02-09 LAB — HEMOGLOBIN A1C: Hgb A1c MFr Bld: 6 % (ref 4.6–6.5)

## 2019-02-09 LAB — TSH: TSH: 0.49 u[IU]/mL (ref 0.35–4.50)

## 2019-02-09 LAB — HIV ANTIBODY (ROUTINE TESTING W REFLEX): HIV 1&2 Ab, 4th Generation: NONREACTIVE

## 2019-02-09 NOTE — Addendum Note (Signed)
Addended by: Ellamae Sia on: 02/09/2019 11:39 AM   Modules accepted: Orders

## 2019-02-11 LAB — URINE CULTURE
MICRO NUMBER:: 1212652
SPECIMEN QUALITY:: ADEQUATE

## 2019-02-13 ENCOUNTER — Telehealth: Payer: Self-pay | Admitting: Family Medicine

## 2019-02-13 NOTE — Telephone Encounter (Signed)
Pt is calling for lab results and urine culture results.  ATC patient, mailbox full.   These have not been resulted yet.  Please advise Jackelyn Poling, thanks.

## 2019-02-14 ENCOUNTER — Encounter: Payer: Self-pay | Admitting: Family Medicine

## 2019-02-14 MED ORDER — FOSFOMYCIN TROMETHAMINE 3 G PO PACK: 3.0000 g | PACK | Freq: Once | ORAL | 0 refills | Status: AC

## 2019-02-14 NOTE — Addendum Note (Signed)
Addended by: Clarene Reamer B on: 02/14/2019 08:12 AM   Modules accepted: Orders

## 2019-02-14 NOTE — Telephone Encounter (Signed)
See result note. Patient contacted.

## 2019-03-01 ENCOUNTER — Telehealth: Payer: Self-pay | Admitting: *Deleted

## 2019-03-01 NOTE — Telephone Encounter (Signed)
Pt was prescribed an abx on 02/14/2019 for a bladder infection.  Monurol 3g Pack (1 dose)  Emi Belfast, FNP  02/12/2019 1:50 PM EST    Please call patient and tell her that her urine shows bladder infection. I have sent in antibiotic. Please follow up if she is having any symptoms- burning, frequency, blood after completing antibiotic.  Her labs otherwise look good, hgba1c looks great. Inflammatory marker barely above normal and better than last year.  She does not have antibodies to [place] virus. It is important to continue to be vigilant about protection.  If continued weight loss, please schedule in office visit.    Pt states that she did not take this as the pharmacy recommended not to based on the high risk of side effects and her having a stomach band and stomach sensitivity already. Pt states that she thought about it and did not want to give herself an unnecessary stomach issues.  Pt states that she is still having a strong urin odor but no other symptoms.  She states that she knows that if she is still having a strong odor to her urine that she must have an infection still. She wants to know if she needs a different abx.   Please advise, thanks.

## 2019-03-02 ENCOUNTER — Other Ambulatory Visit: Payer: Self-pay | Admitting: Family Medicine

## 2019-03-02 DIAGNOSIS — N3090 Cystitis, unspecified without hematuria: Secondary | ICD-10-CM

## 2019-03-02 MED ORDER — CEPHALEXIN 250 MG/5ML PO SUSR: 500.0000 mg | Freq: Two times a day (BID) | ORAL | 0 refills | Status: AC

## 2019-03-02 MED ORDER — CEPHALEXIN 250 MG PO CAPS: 500.0000 mg | ORAL_CAPSULE | Freq: Two times a day (BID) | ORAL | 0 refills | Status: DC

## 2019-03-02 NOTE — Telephone Encounter (Signed)
Update.  I received a call from the pharmacist at [facility] and the medication that I sent does contain red dye.  He said that an alternative would be the cephalexin suspension (liquid).  Please let the patient know that she will be on a liquid medication to avoid red dye.  Prescription sent to patient's pharmacy

## 2019-03-02 NOTE — Telephone Encounter (Signed)
Left detailed message on verified voicemail with instructions regarding Rx.  Pt advised to call the office if she has any questions or concerns.  Nothing further needed.

## 2019-03-02 NOTE — Telephone Encounter (Signed)
Please call patient and tell her that I did not see any contraindications for her taking the Monurol.  I have sent in a different antibiotic, cephalexin.  To avoid red dye, I had to send in the 250 mg capsules which she will take 2 of twice a day for 7 days.  If symptoms are not completely resolved after finishing the antibiotic, please tell her to let us know so we can do a repeat urine culture.

## 2019-03-06 ENCOUNTER — Encounter: Payer: Managed Care, Other (non HMO) | Admitting: Obstetrics and Gynecology

## 2019-03-14 ENCOUNTER — Other Ambulatory Visit: Payer: Self-pay | Admitting: Family Medicine

## 2019-03-14 MED ORDER — SPIRONOLACTONE 25 MG PO TABS: 25.0000 mg | ORAL_TABLET | Freq: Every day | ORAL | 0 refills | Status: DC

## 2019-03-20 ENCOUNTER — Encounter: Payer: Managed Care, Other (non HMO) | Admitting: Obstetrics and Gynecology

## 2019-03-23 ENCOUNTER — Other Ambulatory Visit: Payer: Self-pay | Admitting: Family Medicine

## 2019-03-23 MED ORDER — MONTELUKAST SODIUM 10 MG PO TABS: 10.0000 mg | ORAL_TABLET | Freq: Every day | ORAL | 2 refills | Status: DC

## 2019-03-23 MED ORDER — ATORVASTATIN CALCIUM 10 MG PO TABS: ORAL_TABLET | ORAL | 2 refills | Status: DC

## 2019-03-23 NOTE — Telephone Encounter (Signed)
Do not see any refills request beside this one. Prescriptions refilled and patient advised. Patient was advised that she will be due for CPE in June

## 2019-03-23 NOTE — Telephone Encounter (Signed)
Pt called checking on these rx  Atorvastatin singulair   [facility] [address]  Pt is out of her meds

## 2019-03-29 ENCOUNTER — Other Ambulatory Visit: Payer: Self-pay

## 2019-03-30 ENCOUNTER — Encounter: Payer: Self-pay | Admitting: Obstetrics and Gynecology

## 2019-03-30 ENCOUNTER — Other Ambulatory Visit: Payer: Self-pay

## 2019-03-30 ENCOUNTER — Other Ambulatory Visit: Payer: Self-pay | Admitting: Obstetrics and Gynecology

## 2019-03-30 ENCOUNTER — Ambulatory Visit: Payer: Managed Care, Other (non HMO) | Admitting: Obstetrics and Gynecology

## 2019-03-30 VITALS — BP 122/78 | Ht 64.5 in | Wt 228.0 lb

## 2019-03-30 DIAGNOSIS — N390 Urinary tract infection, site not specified: Secondary | ICD-10-CM

## 2019-03-30 DIAGNOSIS — Z01419 Encounter for gynecological examination (general) (routine) without abnormal findings: Secondary | ICD-10-CM | POA: Diagnosis not present

## 2019-03-30 DIAGNOSIS — Z30431 Encounter for routine checking of intrauterine contraceptive device: Secondary | ICD-10-CM

## 2019-03-30 MED ORDER — FLUCONAZOLE 150 MG PO TABS: 150.0000 mg | ORAL_TABLET | ORAL | 0 refills | Status: DC

## 2019-03-30 MED ORDER — ESTRADIOL 0.1 MG/GM VA CREA: TOPICAL_CREAM | VAGINAL | 12 refills | Status: DC

## 2019-03-30 MED ORDER — CEFDINIR 300 MG PO CAPS: 300.0000 mg | ORAL_CAPSULE | Freq: Two times a day (BID) | ORAL | 0 refills | Status: DC

## 2019-03-30 NOTE — Patient Instructions (Addendum)
Please remember to schedule a colonoscopy and mammogram this year Try the vaginal estrogen cream, if estrace is too expensive let us know and we can have a pharmacy compound an estrogen cream that is usually more affordable

## 2019-03-30 NOTE — Progress Notes (Signed)
Debra Myers [birth year] [id number]  SUBJECTIVE:  54 y.o. G48P1011 female for annual routine gynecologic exam. She is experiencing vaginal dryness and discomfort with intercourse with her husband.  This was not previously a problem for her.  She is using lubricants with minimal effect.  Current Outpatient Medications  Medication Sig Dispense Refill  . albuterol (VENTOLIN HFA) 108 (90 Base) MCG/ACT inhaler INHALE 2 PUFFS BY MOUTH INTO THE LUNGS EVERY 6 HOURS IF NEEDED FOR WHEEZING OR SHORTNESS OF BREATH 18 g 3  . atorvastatin (LIPITOR) 10 MG tablet TAKE 1 TABLET(10 MG) BY MOUTH DAILY 90 tablet 2  . buPROPion (WELLBUTRIN XL) 300 MG 24 hr tablet Take 300 mg by mouth daily.    . cetirizine (ZYRTEC) 10 MG tablet Take 10 mg by mouth at bedtime.    . fluticasone (FLONASE) 50 MCG/ACT nasal spray SHAKE LIQUID AND USE 2 SPRAYS IN EACH NOSTRIL DAILY AS NEEDED FOR RHINITIS 16 g 3  . HYDROcodone-acetaminophen (NORCO/VICODIN) 5-325 MG tablet Take 0.5-1 tablets by mouth 2 (two) times daily as needed for severe pain. Must last 30 days 60 tablet 0  . [START ON 04/13/2019] HYDROcodone-acetaminophen (NORCO/VICODIN) 5-325 MG tablet Take 0.5-1 tablets by mouth 2 (two) times daily as needed for severe pain. Must last 30 days 60 tablet 0  . ibuprofen (ADVIL) 200 MG tablet Take 400 mg by mouth every 8 (eight) hours as needed.    . lamoTRIgine (LAMICTAL) 200 MG tablet Take 300 mg by mouth at bedtime.     Marland Kitchen LORazepam (ATIVAN) 1 MG tablet Take 0.5-1 mg by mouth every 8 (eight) hours as needed for anxiety or sleep.     . metaxalone (SKELAXIN) 800 MG tablet Take 1 tablet (800 mg total) by mouth every 8 (eight) hours as needed for muscle spasms. Max: 3/day 90 tablet 5  . metFORMIN (GLUCOPHAGE) 500 MG tablet TAKE 1 TABLET BY MOUTH EVERY DAY WITH BREAKFAST 60 tablet 5  . montelukast (SINGULAIR) 10 MG tablet Take 1 tablet (10 mg total) by mouth at bedtime. 90 tablet 2  . PARoxetine (PAXIL-CR) 25 MG 24 hr tablet Take 50 mg by  mouth at bedtime.     Marland Kitchen RA ASPIRIN EC ADULT LOW ST 81 MG EC tablet take 1 tablet by mouth once daily 30 tablet 0  . spironolactone (ALDACTONE) 25 MG tablet Take 1 tablet (25 mg total) by mouth daily. 90 tablet 0  . VYVANSE 40 MG capsule Take 40 mg by mouth daily.  0  . cefdinir (OMNICEF) 300 MG capsule Take 1 capsule (300 mg total) by mouth 2 (two) times daily for 7 days. 14 capsule 0  . fluconazole (DIFLUCAN) 150 MG tablet Take 1 tablet (150 mg total) by mouth every 3 (three) days for 2 doses. 2 tablet 0  . HYDROcodone-acetaminophen (NORCO/VICODIN) 5-325 MG tablet Take 0.5-1 tablets by mouth 2 (two) times daily as needed for severe pain. Must last 30 days 60 tablet 0   No current facility-administered medications for this visit.   Allergies: Oxycodone, Penicillins, Red dye, and Sulfa antibiotics  No LMP recorded. (Menstrual status: IUD).  Past medical history,surgical history, problem list, medications, allergies, family history and social history were all reviewed and documented as reviewed in the EPIC chart.  ROS:  Feeling well. No dyspnea or chest pain on exertion.  No abdominal pain, change in bowel habits, black or bloody stools.  No urinary tract symptoms. GYN ROS: amenorrhea, no abnormal bleeding, pelvic pain or discharge, no breast pain or  new or enlarging lumps on self exam. No neurological complaints.   OBJECTIVE:  BP 122/78   Ht 5' 4.5" (1.638 m)   Wt 228 lb (103.4 kg)   BMI 38.53 kg/m  The patient appears well, alert, oriented x 3, in no distress. ENT normal.  Neck supple. No cervical or supraclavicular adenopathy or thyromegaly.  Lungs are clear, good air entry, no wheezes, rhonchi or rales. S1 and S2 normal, no murmurs, regular rate and rhythm.  Abdomen soft without tenderness, guarding, mass or organomegaly.  Neurological is normal, no focal findings.  BREAST EXAM: breasts appear normal, no suspicious masses, no skin or nipple changes or axillary nodes  PELVIC EXAM:  VULVA: normal appearing vulva with no masses, tenderness or lesions, atrophic changes noted, VAGINA: normal appearing vagina with normal color and discharge, no lesions, CERVIX: normal appearing cervix without discharge or lesions, IUD strings 2 cm, UTERUS: uterus is normal size, shape, consistency and nontender, ADNEXA: normal adnexa in size, nontender and no masses  Chaperone: Britt Bottom present during the examination  ASSESSMENT:  54 y.o. [name] here for annual gynecologic exam  PLAN:   1. Postmenopausal. Vaginal atrophy symptoms leading to discomfort with intercourse, Estrace cream is prescribed.  She is cautioned to let us know if the prescription is too expensive and then we can order an alternative compounded formulation that may be more affordable instead.  She has a Mirena IUD in place since 09/2014 which will be due to be removed at this summer, and we could consider giving her time off contraception to see if she is menopausal or not and/or check and FSH. 2. Pap smear/HPV 02/2018. Not repeated today. No prior history of abnormal Pap smears. Next Pap smear due 2025 following the current screening guidelines calling for the 5-year interval. 3. Mammogram 10/2018. Will continue with annual mammography. Breast exam normal today. 4. Colonoscopy not yet done.  I recommended that she schedule this as soon as possible for colon cancer screening. 5. History of small calcified area right ovary 13 x 12 mm on ultrasound 2017.  Negative color flow.  Negative CA 125.  Serial ultrasound showed area remained unchanged.  Discussed continuing monitoring versus no further studies and she is comfortable with no further studies. 6. Health maintenance.  No lab work as she has this completed elsewhere.  Return annually or sooner, prn.  Joseph Pierini MD  03/30/19

## 2019-05-08 ENCOUNTER — Telehealth: Payer: Self-pay

## 2019-05-08 NOTE — Telephone Encounter (Signed)
No answer. Left message to call office to review meds for VV on 05/09/19

## 2019-05-08 NOTE — Telephone Encounter (Signed)
2nd attempt to reach patient. Left message to call us.

## 2019-05-08 NOTE — Progress Notes (Signed)
Patient: Debra Myers  Service Category: E/M  Provider: Gaspar Cola, MD  DOB: Debra Myers, Debra Myers  DOS: 05/09/2019  Location: Office  MRN: [id number]  Setting: Ambulatory outpatient  Referring Provider: Elby Beck, FNP  Type: Established Patient  Specialty: Interventional Pain Management  PCP: Elby Beck, FNP  Location: Remote location  Delivery: TeleHealth     Virtual Encounter - Pain Management PROVIDER NOTE: Information contained herein reflects review and annotations entered in association with encounter. Interpretation of such information and data should be left to medically-trained personnel. Information provided to patient can be located elsewhere in the medical record under "Patient Instructions". Document created using STT-dictation technology, any transcriptional errors that may result from process are unintentional.    Contact & Pharmacy Preferred: [phone] Home: [phone] (home) Mobile: [phone] (mobile) E-mail: [email or web address]  [id number] - [place], [place] [address] [place] [address] Phone: [phone] Fax: [phone]   Pre-screening  Debra Myers offered "in-person" vs "virtual" encounter. She indicated preferring virtual for this encounter.   Reason COVID-19*  Social distancing based on CDC and AMA recommendations.   I contacted Debra Myers on 05/09/2019 via telephone.      I clearly identified myself as Gaspar Cola, MD. I verified that I was speaking with the correct person using two identifiers (Name: Debra Myers, and date of birth: [birth year], Debra Myers).  Consent I sought verbal advanced consent from Debra Myers for virtual visit interactions. I informed Debra Myers of possible security and privacy concerns, risks, and limitations associated with providing "not-in-person" medical evaluation and management services. I also informed [name] of the availability of "in-person" appointments. Finally, I informed her that there would be a charge for the virtual visit and that she could be  personally, fully or partially, financially responsible for it. Debra Myers expressed understanding and agreed to proceed.   Historic Elements   Debra Myers is a 54 y.o. year old, female patient evaluated today after her last contact with our practice on 3/Myers/2021. Debra Myers  has a past medical history of Anxiety, Asthma, Bulging lumbar disc (L3-4) (02/26/2015), Degenerative disc disease, Degenerative lumbar disc, Depression, Diabetes mellitus, Elective abortion, GERD (gastroesophageal reflux disease), Hypertension, Stroke ([place]) (06/11/2015), and Vaginal delivery. She also  has a past surgical history that includes Cholecystectomy (2001); Lapband (04/2012); Wisdom tooth extraction; Dilatation & curettage/hysteroscopy with myosure (N/A, 09/10/2014); mirena; and lap band surgery. [name] has a current medication list which includes the following prescription(s): albuterol, atorvastatin, bupropion, cetirizine, estradiol, fluticasone, [START ON 05/13/2019] hydrocodone-acetaminophen, [START ON 06/12/2019] hydrocodone-acetaminophen, [START ON 07/12/2019] hydrocodone-acetaminophen, ibuprofen, lamotrigine, lorazepam, metaxalone, metformin, montelukast, paroxetine, ra aspirin ec adult low st, spironolactone, and vyvanse. She  reports that she has never smoked. She has never used smokeless tobacco. She reports that she does not drink alcohol or use drugs. [name] is allergic to oxycodone; penicillins; red dye; and sulfa antibiotics.   HPI  Today, she is being contacted for medication management. The patient indicates doing well with the current medication regimen. No adverse reactions or side effects reported to the medications.   The patient indicates that she is pending her second Covid 19 injection.  She was very concerned about the restrictions and she wanted to  be sure about the.  A time that she needs to wait before having any steroid injections.  I told her that it would be best if  she could wait 2 weeks after the vaccine so that she can have full immunity before taking any steroids that may decrease her immune system.  She understood and accepted.  She says that she is having a little bit more back pain and would like to consider there an injection in the area.  Up to this point, she has managed to avoid getting any interventional therapies, but she is considering doing something for her back pain.  Pharmacotherapy Assessment  Analgesic: Hydrocodone/APAP 5/325, 2 tab PO QD (10 mg/day of hydrocodone) MME/day:10 mg/day.   Monitoring: [place] PMP: PDMP reviewed during this encounter.       Pharmacotherapy: No side-effects or adverse reactions reported. Compliance: No problems identified. Effectiveness: Clinically acceptable. Plan: Refer to "POC".  UDS:  Summary  Date Value Ref Range Status  04/18/2018 FINAL  Final    Comment:    ==================================================================== TOXASSURE SELECT 13 (MW) ==================================================================== Test                             Result       Flag       Units Drug Present and Declared for Prescription Verification   Amphetamine                    1499         EXPECTED   ng/mg creat    Amphetamine is available as a schedule II prescription drug.   Lorazepam                      731          EXPECTED   ng/mg creat    Source of lorazepam is a scheduled prescription medication.   Hydrocodone                    536          EXPECTED   ng/mg creat   Dihydrocodeine                 96           EXPECTED   ng/mg creat   Norhydrocodone                 666          EXPECTED   ng/mg creat    Sources of hydrocodone include scheduled prescription    medications. Dihydrocodeine and norhydrocodone are expected    metabolites of hydrocodone. Dihydrocodeine is also  available as a    scheduled prescription medication. ==================================================================== Test                      Result    Flag   Units      Ref Range   Creatinine              95               mg/dL      >=20 ==================================================================== Declared Medications:  The flagging and interpretation on this report are based on the  following declared medications.  Unexpected results may arise from  inaccuracies in the declared medications.  **Note: The testing scope of this panel includes these medications:  Amphetamine (Lisdexamfetamine)  Hydrocodone (Hydrocodone-Acetaminophen)  Lorazepam  **Note: The testing scope of this panel does not include following  reported medications:  Acetaminophen (Hydrocodone-Acetaminophen)  Albuterol  Aspirin (Aspirin 81)  Atorvastatin  Bupropion  Cetirizine  Fluticasone  Ibuprofen  Lamotrigine  Metaxalone  Metformin  Montelukast  Paroxetine  Spironolactone ==================================================================== For clinical consultation, please call [phone]. ====================================================================    Laboratory Chemistry Profile   Renal Lab Results  Component Value Date   BUN 13 02/08/2019   CREATININE 0.99 02/08/2019   BCR 15 05/26/2017   GFR 58.78 (L) 02/08/2019   GFRAA >60 12/16/2017   GFRNONAA >60 12/16/2017    Hepatic Lab Results  Component Value Date   AST 14 02/08/2019   ALT 15 02/08/2019   ALBUMIN 4.4 02/08/2019   ALKPHOS 108 02/08/2019   HCVAB NEGATIVE 05/18/2013    Electrolytes Lab Results  Component Value Date   NA 139 02/08/2019   K 4.1 02/08/2019   CL 104 02/08/2019   CALCIUM 9.1 02/08/2019   PHOS 4.0 10/31/2015    Bone Lab Results  Component Value Date   VD25OH 31.95 02/08/2019    Inflammation (CRP: Acute Phase) (ESR: Chronic Phase) Lab Results  Component Value Date   ESRSEDRATE 32  05/26/2017      Note: Above Lab results reviewed.  Imaging  VAS [name] LOWER EXTREMITY VENOUS (DVT) (MC and [facility] 7a-7p)  Lower Venous Study  Indications: Pain.   Performing Technologist: Maudry Mayhew MHA, RDMS, RVT, RDCS    Examination Guidelines: A complete evaluation includes B-mode imaging, spectral Doppler, color Doppler, and power Doppler as needed of all accessible portions of each vessel. Bilateral testing is considered an integral part of a complete examination. Limited examinations for reoccurring indications may be performed as noted.    Right Venous Findings: +---+---------------+---------+-----------+----------+-------+    CompressibilityPhasicitySpontaneityPropertiesSummary +---+---------------+---------+-----------+----------+-------+ CFVFull           Yes      Yes                          +---+---------------+---------+-----------+----------+-------+     Left Venous Findings: +---------+---------------+---------+-----------+----------+-------+          CompressibilityPhasicitySpontaneityPropertiesSummary +---------+---------------+---------+-----------+----------+-------+ CFV      Full           Yes      Yes                          +---------+---------------+---------+-----------+----------+-------+ SFJ      Full                                                 +---------+---------------+---------+-----------+----------+-------+ FV Prox  Full                                                 +---------+---------------+---------+-----------+----------+-------+ FV Mid   Full                                                 +---------+---------------+---------+-----------+----------+-------+ FV DistalFull                                                 +---------+---------------+---------+-----------+----------+-------+  PFV      Full                                                  +---------+---------------+---------+-----------+----------+-------+ POP      Full           Yes      Yes                          +---------+---------------+---------+-----------+----------+-------+ PTV      Full                                                 +---------+---------------+---------+-----------+----------+-------+ PERO     Full                                                 +---------+---------------+---------+-----------+----------+-------+          Summary: Right: No evidence of common femoral vein obstruction. Left: There is no evidence of deep vein thrombosis in the lower extremity. No cystic structure found in the popliteal fossa.   *See table(s) above for measurements and observations.  Electronically signed by Monica Martinez MD on 12/16/2017 at 30:04:46 PM.      Final    Assessment  The primary encounter diagnosis was Chronic pain syndrome. Diagnoses of Chronic low back pain (Primary Area of Pain) (Bilateral) (L>R) and Lumbar facet syndrome (Bilateral) (L>R) were also pertinent to this visit.  Plan of Care  Problem-specific:  No problem-specific Assessment & Plan notes found for this encounter.  Debra Myers has a current medication list which includes the following long-term medication(s): albuterol, atorvastatin, fluticasone, [START ON 05/13/2019] hydrocodone-acetaminophen, [START ON 06/12/2019] hydrocodone-acetaminophen, [START ON 07/12/2019] hydrocodone-acetaminophen, metaxalone, metformin, montelukast, and spironolactone.  Pharmacotherapy (Medications Ordered): Meds ordered this encounter  Medications  . HYDROcodone-acetaminophen (NORCO/VICODIN) 5-325 MG tablet    Sig: Take 0.5-1 tablets by mouth 2 (two) times daily as needed for severe pain. Must last 30 days    Dispense:  60 tablet    Refill:  0    Chronic Pain: STOP Act (Not applicable) Fill 1 day early if closed on refill date. Do not fill until: 05/13/2019. To last  until: 06/12/2019. Avoid benzodiazepines within 8 hours of opioids  . HYDROcodone-acetaminophen (NORCO/VICODIN) 5-325 MG tablet    Sig: Take 0.5-1 tablets by mouth 2 (two) times daily as needed for severe pain. Must last 30 days    Dispense:  60 tablet    Refill:  0    Chronic Pain: STOP Act (Not applicable) Fill 1 day early if closed on refill date. Do not fill until: 06/12/2019. To last until: 07/12/2019. Avoid benzodiazepines within 8 hours of opioids  . HYDROcodone-acetaminophen (NORCO/VICODIN) 5-325 MG tablet    Sig: Take 0.5-1 tablets by mouth 2 (two) times daily as needed for severe pain. Must last 30 days    Dispense:  60 tablet    Refill:  0    Chronic Pain: STOP Act (Not applicable) Fill 1 day early if closed on refill date. Do not fill  until: 07/12/2019. To last until: 08/11/2019. Avoid benzodiazepines within 8 hours of opioids   Orders:  Orders Placed This Encounter  Procedures  . LUMBAR FACET(MEDIAL BRANCH NERVE BLOCK) MBNB    Scheduling timeframe: (PRN procedure) Debra Myers will call when needed. Clinical indication: Axial low back pain. Lumbosacral Spondylosis (M47.897).  Sedation: Usually done with sedation. (May be done without sedation if so desired by patient.) Requirements: NPO x 8 hrs.; Driver; Stop blood thinners. Interval: No sooner than two weeks for diagnostic or therapeutic. No sooner than every other month for palliative.    Standing Status:   Standing    Number of Occurrences:   5    Standing Expiration Date:   05/08/2020    Scheduling Instructions:     Procedure: Lumbar facet block (AKA.: Lumbosacral medial branch nerve block)     Level: L3-4, L4-5, & L5-S1 Facets (L2, L3, L4, L5, & S1 Medial Branch Nerves)     Laterality: Bilateral    Order Specific Question:   Where will this procedure be performed?    Answer:   [facility]   Follow-up plan:   Return in about 13 weeks (around 6/Myers/2021) for (VV), (MM), in addition, PRN Procedure(s): (B) L-FCT BLK #1,  (w/ Sedation).      Interventional management options: Planned, scheduled, and/or pending:      Considering:   Diagnostic bilateral lumbar facet block  Possible bilateral lumbar facet RFA.  Diagnostic left L3-4 LESI  Diagnostic left L3 TFESI  Diagnostic left L4 TFESI  Diagnostic bilateral L5 TFESI  Diagnostic left CESI  Diagnostic bilateral cervical facet block  Possible bilateral cervical facet RFA.    Palliative PRN treatment(s):   None at this time    Recent Visits No visits were found meeting these conditions.  Showing recent visits within past 90 days and meeting all other requirements   Today's Visits Date Type Provider Dept  05/09/19 Telemedicine Milinda Pointer, MD [facility]  Showing today's visits and meeting all other requirements   Future Appointments No visits were found meeting these conditions.  Showing future appointments within next 90 days and meeting all other requirements   I discussed the assessment and treatment plan with the patient. The patient was provided an opportunity to ask questions and all were answered. The patient agreed with the plan and demonstrated an understanding of the instructions.  Patient advised to call back or seek an in-person evaluation if the symptoms or condition worsens.  Duration of encounter: 14 minutes.  Note by: Gaspar Cola, MD Date: 05/09/2019; Time: 10:48 AM

## 2019-05-09 ENCOUNTER — Other Ambulatory Visit: Payer: Self-pay

## 2019-05-09 ENCOUNTER — Ambulatory Visit: Payer: Managed Care, Other (non HMO) | Attending: Pain Medicine | Admitting: Pain Medicine

## 2019-05-09 ENCOUNTER — Telehealth: Payer: Self-pay

## 2019-05-09 DIAGNOSIS — G894 Chronic pain syndrome: Secondary | ICD-10-CM

## 2019-05-09 DIAGNOSIS — M47816 Spondylosis without myelopathy or radiculopathy, lumbar region: Secondary | ICD-10-CM

## 2019-05-09 DIAGNOSIS — M545 Low back pain, unspecified: Secondary | ICD-10-CM

## 2019-05-09 DIAGNOSIS — G8929 Other chronic pain: Secondary | ICD-10-CM | POA: Diagnosis not present

## 2019-05-09 MED ORDER — HYDROCODONE-ACETAMINOPHEN 5-325 MG PO TABS: 0.5000 | ORAL_TABLET | Freq: Two times a day (BID) | ORAL | 0 refills | Status: DC | PRN

## 2019-05-09 NOTE — Patient Instructions (Signed)

## 2019-05-09 NOTE — Telephone Encounter (Signed)
LM forpatient to callus back.

## 2019-05-09 NOTE — Telephone Encounter (Signed)
Pt returned the call. Please give her a call.                                      Thanks  

## 2019-05-09 NOTE — Telephone Encounter (Signed)
LM again for patient  Per Dr Laban Emperor request.

## 2019-06-12 ENCOUNTER — Other Ambulatory Visit: Payer: Self-pay | Admitting: Family Medicine

## 2019-06-13 NOTE — Telephone Encounter (Signed)
Last apt 02/07/19 Next apt unknown Last fill 03/14/19, #90, 0 RF Sent in script

## 2019-08-07 ENCOUNTER — Telehealth: Payer: Self-pay

## 2019-08-07 DIAGNOSIS — M899 Disorder of bone, unspecified: Secondary | ICD-10-CM | POA: Insufficient documentation

## 2019-08-07 DIAGNOSIS — Z789 Other specified health status: Secondary | ICD-10-CM | POA: Insufficient documentation

## 2019-08-07 DIAGNOSIS — Z79899 Other long term (current) drug therapy: Secondary | ICD-10-CM | POA: Insufficient documentation

## 2019-08-07 NOTE — Progress Notes (Signed)
Patient: Debra Myers  Service Category: E/M  Provider: Gaspar Cola, MD  DOB: 07/14/1965  DOS: 08/08/2019  Location: Office  MRN: [id number]  Setting: Ambulatory outpatient  Referring Provider: Elby Beck, FNP  Type: Established Patient  Specialty: Interventional Pain Management  PCP: Elby Beck, FNP  Location: Remote location  Delivery: TeleHealth     Virtual Encounter - Pain Management PROVIDER NOTE: Information contained herein reflects review and annotations entered in association with encounter. Interpretation of such information and data should be left to medically-trained personnel. Information provided to patient can be located elsewhere in the medical record under "Patient Instructions". Document created using STT-dictation technology, any transcriptional errors that may result from process are unintentional.    Contact & Pharmacy Preferred: [phone] Home: [phone] (home) Mobile: [phone] (mobile) E-mail: [email or web address]  [id number] - [address] [place] [address] Phone: [phone] Fax: [phone]  [id number]: [phone]: [phone]  Pre-screening  [name] offered "in-person" vs "virtual" encounter. She indicated preferring virtual for this encounter.   Reason COVID-19*  Social distancing based on CDC and AMA recommendations.   I contacted Debra Myers 08/08/2019 via telephone.      I clearly identified myself as FGaspar Cola MD. I verified that I was speaking with the correct person using two identifiers (Name: RKESLYN TEATER and date of birth: 502-Aug-1967.  Consent I sought verbal advanced consent from Debra Julianfor virtual visit interactions. I informed [name] of possible security and privacy  concerns, risks, and limitations associated with providing "not-in-person" medical evaluation and management services. I also informed [name] of the availability of "in-person" appointments. Finally, I informed her that there would be a charge for the virtual visit and that she could be  personally, fully or partially, financially responsible for it. Ms. CBrandenburgerexpressed understanding and agreed to proceed.   Historic Elements   Ms. Debra SALMONis a 54y.o. year old, female patient evaluated today after her last contact with our practice on 08/07/2019. Debra Myers has a past medical history of Anxiety, Asthma, Bulging lumbar disc (L3-4) (02/26/2015), Degenerative disc disease, Degenerative lumbar disc, Depression, Diabetes mellitus, Elective abortion, GERD (gastroesophageal reflux disease), Hypertension, Stroke ([place] (06/11/2015), and Vaginal delivery. She also  has a past surgical history that includes Cholecystectomy (2001); Lapband (04/2012); Wisdom tooth extraction; Dilatation & curettage/hysteroscopy with myosure (N/A, 09/10/2014); mirena; and lap band surgery. Ms. CNazzarohas a current medication list which includes the following prescription(s): albuterol, atorvastatin, bupropion, cetirizine, estradiol, fluticasone, [START ON 08/11/2019] hydrocodone-acetaminophen, [START ON 09/10/2019] hydrocodone-acetaminophen, [START ON 10/10/2019] hydrocodone-acetaminophen, ibuprofen, lamotrigine, lorazepam, [START ON 08/11/2019] metaxalone, metformin, montelukast, paroxetine, ra aspirin ec adult low st, spironolactone, and vyvanse. She  reports that she has never smoked. She has never used smokeless tobacco. She reports that she does not drink alcohol and does not use drugs. Ms. CMatarazzois allergic to oxycodone, penicillins, red dye, and sulfa antibiotics.   HPI  Today, she is being contacted for medication management. The patient indicates doing well with the current medication regimen. No adverse reactions or side effects  reported to the medications.   Pharmacotherapy Assessment  Analgesic: Hydrocodone/APAP 5/325, 2 tab PO QD (10 mg/day of hydrocodone) MME/day:10 mg/day.   Monitoring: [place] PMP: PDMP reviewed during this encounter.  Pharmacotherapy: No side-effects or adverse reactions reported. Compliance: No problems identified. Effectiveness: Clinically acceptable. Plan: Refer to "POC".  UDS:  Summary  Date Value Ref Range Status  04/18/2018 FINAL  Final    Comment:    ==================================================================== TOXASSURE SELECT 13 (MW) ==================================================================== Test                             Result       Flag       Units Drug Present and Declared for Prescription Verification   Amphetamine                    1499         EXPECTED   ng/mg creat    Amphetamine is available as a schedule II prescription drug.   Lorazepam                      731          EXPECTED   ng/mg creat    Source of lorazepam is a scheduled prescription medication.   Hydrocodone                    536          EXPECTED   ng/mg creat   Dihydrocodeine                 96           EXPECTED   ng/mg creat   Norhydrocodone                 666          EXPECTED   ng/mg creat    Sources of hydrocodone include scheduled prescription    medications. Dihydrocodeine and norhydrocodone are expected    metabolites of hydrocodone. Dihydrocodeine is also available as a    scheduled prescription medication. ==================================================================== Test                      Result    Flag   Units      Ref Range   Creatinine              95               mg/dL      >=20 ==================================================================== Declared Medications:  The flagging and interpretation on this report are based on the  following declared medications.  Unexpected results may arise from  inaccuracies in the declared medications.   **Note: The testing scope of this panel includes these medications:  Amphetamine (Lisdexamfetamine)  Hydrocodone (Hydrocodone-Acetaminophen)  Lorazepam  **Note: The testing scope of this panel does not include following  reported medications:  Acetaminophen (Hydrocodone-Acetaminophen)  Albuterol  Aspirin (Aspirin 81)  Atorvastatin  Bupropion  Cetirizine  Fluticasone  Ibuprofen  Lamotrigine  Metaxalone  Metformin  Montelukast  Paroxetine  Spironolactone ==================================================================== For clinical consultation, please call [phone]. ====================================================================     Laboratory Chemistry Profile   Renal Lab Results  Component Value Date   BUN 13 02/08/2019   CREATININE 0.99 02/08/2019   BCR 15 05/26/2017   GFR 58.78 (L) 02/08/2019   GFRAA >60 12/16/2017   GFRNONAA >60 12/16/2017     Hepatic Lab Results  Component Value Date   AST 14 02/08/2019   ALT 15 02/08/2019   ALBUMIN 4.4 02/08/2019   ALKPHOS 108 02/08/2019   HCVAB  NEGATIVE 05/18/2013     Electrolytes Lab Results  Component Value Date   NA 139 02/08/2019   K 4.1 02/08/2019   CL 104 02/08/2019   CALCIUM 9.1 02/08/2019   PHOS 4.0 10/31/2015     Bone Lab Results  Component Value Date   VD25OH 31.95 02/08/2019     Inflammation (CRP: Acute Phase) (ESR: Chronic Phase) Lab Results  Component Value Date   ESRSEDRATE 32 05/26/2017       Note: Above Lab results reviewed.   Imaging  VAS [name] LOWER EXTREMITY VENOUS (DVT) (MC and WL 7a-7p)  Lower Venous Study  Indications: Pain.   Performing Technologist: Maudry Mayhew MHA, RDMS, RVT, RDCS    Examination Guidelines: A complete evaluation includes B-mode imaging, spectral Doppler, color Doppler, and power Doppler as needed of all accessible portions of each vessel. Bilateral testing is considered an integral part of a complete examination. Limited examinations  for reoccurring indications may be performed as noted.    Right Venous Findings: +---+---------------+---------+-----------+----------+-------+    CompressibilityPhasicitySpontaneityPropertiesSummary +---+---------------+---------+-----------+----------+-------+ CFVFull           Yes      Yes                          +---+---------------+---------+-----------+----------+-------+     Left Venous Findings: +---------+---------------+---------+-----------+----------+-------+          CompressibilityPhasicitySpontaneityPropertiesSummary +---------+---------------+---------+-----------+----------+-------+ CFV      Full           Yes      Yes                          +---------+---------------+---------+-----------+----------+-------+ SFJ      Full                                                 +---------+---------------+---------+-----------+----------+-------+ FV Prox  Full                                                 +---------+---------------+---------+-----------+----------+-------+ FV Mid   Full                                                 +---------+---------------+---------+-----------+----------+-------+ FV DistalFull                                                 +---------+---------------+---------+-----------+----------+-------+ PFV      Full                                                 +---------+---------------+---------+-----------+----------+-------+ POP      Full           Yes      Yes                          +---------+---------------+---------+-----------+----------+-------+  PTV      Full                                                 +---------+---------------+---------+-----------+----------+-------+ PERO     Full                                                 +---------+---------------+---------+-----------+----------+-------+          Summary: Right: No evidence  of common femoral vein obstruction. Left: There is no evidence of deep vein thrombosis in the lower extremity. No cystic structure found in the popliteal fossa.   *See table(s) above for measurements and observations.  Electronically signed by Monica Martinez MD on 12/16/2017 at 6:04:46 PM.      Final    Assessment  The primary encounter diagnosis was Chronic pain syndrome. Diagnoses of Chronic low back pain (Primary Area of Pain) (Bilateral) (L>R), Chronic lower extremity pain (Secondary area of Pain) (Left), Chronic neck pain (Third area of Pain) (Bilateral) (L>R), Pharmacologic therapy, Disorder of skeletal system, Problems influencing health status, Muscle spasm, nocturnal, and Musculoskeletal pain were also pertinent to this visit.  Plan of Care  Problem-specific:  No problem-specific Assessment & Plan notes found for this encounter.  Ms. BRIER FIREBAUGH has a current medication list which includes the following long-term medication(s): albuterol, atorvastatin, fluticasone, [START ON 08/11/2019] hydrocodone-acetaminophen, [START ON 09/10/2019] hydrocodone-acetaminophen, [START ON 10/10/2019] hydrocodone-acetaminophen, [START ON 08/11/2019] metaxalone, metformin, montelukast, and spironolactone.  Pharmacotherapy (Medications Ordered): Meds ordered this encounter  Medications  . metaxalone (SKELAXIN) 800 MG tablet    Sig: Take 1 tablet (800 mg total) by mouth every 8 (eight) hours as needed for muscle spasms. Max: 3/day    Dispense:  90 tablet    Refill:  5    Fill one day early if pharmacy is closed on scheduled refill date. May substitute for generic if available.  Marland Kitchen HYDROcodone-acetaminophen (NORCO/VICODIN) 5-325 MG tablet    Sig: Take 0.5-1 tablets by mouth 2 (two) times daily as needed for severe pain. Must last 30 days    Dispense:  60 tablet    Refill:  0    Chronic Pain: STOP Act (Not applicable) Fill 1 day early if closed on refill date. Do not fill until: 08/11/2019. To last  until: 09/10/2019. Avoid benzodiazepines within 8 hours of opioids  . HYDROcodone-acetaminophen (NORCO/VICODIN) 5-325 MG tablet    Sig: Take 0.5-1 tablets by mouth 2 (two) times daily as needed for severe pain. Must last 30 days    Dispense:  60 tablet    Refill:  0    Chronic Pain: STOP Act (Not applicable) Fill 1 day early if closed on refill date. Do not fill until: 09/10/2019. To last until: 10/10/2019. Avoid benzodiazepines within 8 hours of opioids  . HYDROcodone-acetaminophen (NORCO/VICODIN) 5-325 MG tablet    Sig: Take 0.5-1 tablets by mouth 2 (two) times daily as needed for severe pain. Must last 30 days    Dispense:  60 tablet    Refill:  0    Chronic Pain: STOP Act (Not applicable) Fill 1 day early if closed on refill date. Do not fill until: 10/10/2019. To last until: 11/09/2019. Avoid benzodiazepines within 8 hours  of opioids   Orders:  Orders Placed This Encounter  Procedures  . ToxASSURE Select 13 (MW), Urine    Volume: 30 ml(s). Minimum 3 ml of urine is needed. Document temperature of fresh sample. Indications: Long term (current) use of opiate analgesic (M09.470)    Order Specific Question:   Release to patient    Answer:   Immediate   Follow-up plan:   Return in about 13 weeks (around 11/07/2019) for (F2F), (MM).      Interventional management options: Planned, scheduled, and/or pending:      Considering:   Diagnostic bilateral lumbar facet block  Possible bilateral lumbar facet RFA.  Diagnostic left L3-4 LESI  Diagnostic left L3 TFESI  Diagnostic left L4 TFESI  Diagnostic bilateral L5 TFESI  Diagnostic left CESI  Diagnostic bilateral cervical facet block  Possible bilateral cervical facet RFA.    Palliative PRN treatment(s):   None at this time     Recent Visits No visits were found meeting these conditions. Showing recent visits within past 90 days and meeting all other requirements Today's Visits Date Type Provider Dept  08/08/19 Telemedicine Milinda Pointer, MD [facility]  Showing today's visits and meeting all other requirements Future Appointments No visits were found meeting these conditions. Showing future appointments within next 90 days and meeting all other requirements  I discussed the assessment and treatment plan with the patient. The patient was provided an opportunity to ask questions and all were answered. The patient agreed with the plan and demonstrated an understanding of the instructions.  Patient advised to call back or seek an in-person evaluation if the symptoms or condition worsens.  Duration of encounter: 15 minutes.  Note by: Gaspar Cola, MD Date: 08/08/2019; Time: 12:42 PM

## 2019-08-07 NOTE — Telephone Encounter (Signed)
Attempted to call patient for pre virtual appointment questions.  LM for patient to call us back.

## 2019-08-08 ENCOUNTER — Telehealth: Payer: Self-pay

## 2019-08-08 ENCOUNTER — Other Ambulatory Visit: Payer: Self-pay

## 2019-08-08 ENCOUNTER — Ambulatory Visit: Payer: Managed Care, Other (non HMO) | Attending: Pain Medicine | Admitting: Pain Medicine

## 2019-08-08 DIAGNOSIS — M79605 Pain in left leg: Secondary | ICD-10-CM | POA: Diagnosis not present

## 2019-08-08 DIAGNOSIS — M545 Low back pain: Secondary | ICD-10-CM

## 2019-08-08 DIAGNOSIS — M62838 Other muscle spasm: Secondary | ICD-10-CM

## 2019-08-08 DIAGNOSIS — M542 Cervicalgia: Secondary | ICD-10-CM

## 2019-08-08 DIAGNOSIS — Z789 Other specified health status: Secondary | ICD-10-CM

## 2019-08-08 DIAGNOSIS — G894 Chronic pain syndrome: Secondary | ICD-10-CM

## 2019-08-08 DIAGNOSIS — Z79899 Other long term (current) drug therapy: Secondary | ICD-10-CM

## 2019-08-08 DIAGNOSIS — M899 Disorder of bone, unspecified: Secondary | ICD-10-CM

## 2019-08-08 DIAGNOSIS — G8929 Other chronic pain: Secondary | ICD-10-CM

## 2019-08-08 DIAGNOSIS — M7918 Myalgia, other site: Secondary | ICD-10-CM

## 2019-08-08 MED ORDER — HYDROCODONE-ACETAMINOPHEN 5-325 MG PO TABS: 0.5000 | ORAL_TABLET | Freq: Two times a day (BID) | ORAL | 0 refills | Status: DC | PRN

## 2019-08-08 MED ORDER — METAXALONE 800 MG PO TABS: 800.0000 mg | ORAL_TABLET | Freq: Three times a day (TID) | ORAL | 5 refills | Status: DC | PRN

## 2019-08-08 NOTE — Telephone Encounter (Signed)
Pt was called and message left on answering service. 

## 2019-08-11 ENCOUNTER — Other Ambulatory Visit: Payer: Self-pay | Admitting: Family Medicine

## 2019-08-28 ENCOUNTER — Other Ambulatory Visit: Payer: Self-pay | Admitting: Family Medicine

## 2019-09-10 ENCOUNTER — Other Ambulatory Visit: Payer: Self-pay | Admitting: Family Medicine

## 2019-10-10 ENCOUNTER — Other Ambulatory Visit: Payer: Self-pay | Admitting: Family Medicine

## 2019-10-10 NOTE — Telephone Encounter (Signed)
E-scribed refill.  Plz schedule cpe and lab visits.  

## 2019-10-11 NOTE — Telephone Encounter (Signed)
Left message asking pt to call office  °

## 2019-10-12 ENCOUNTER — Telehealth: Payer: Self-pay | Admitting: Family Medicine

## 2019-10-12 NOTE — Telephone Encounter (Signed)
Spoke with Debra Myers to schedule cpx with debbie  She wanted to know if she could transfer care to [facility].  Debra Myers is aware kate is not taking any new Debra Myers.  Debra Myers daughter see Trenton Founds clap 09/24/99  Ok to transfer care??

## 2019-10-12 NOTE — Telephone Encounter (Signed)
That is fine with me.

## 2019-10-12 NOTE — Telephone Encounter (Signed)
Fine with me, thanks. 

## 2019-10-12 NOTE — Telephone Encounter (Signed)
Pt wanted to transfer care from debbie to [facility] [employer]

## 2019-10-18 NOTE — Telephone Encounter (Signed)
Called patient to schedule a TOC to [employer]. LVM to call back.

## 2019-10-19 NOTE — Telephone Encounter (Signed)
Appointment 10/1

## 2019-11-06 NOTE — Progress Notes (Signed)
PROVIDER NOTE: Information contained herein reflects review and annotations entered in association with encounter. Interpretation of such information and data should be left to medically-trained personnel. Information provided to patient can be located elsewhere in the medical record under "Patient Instructions". Document created using STT-dictation technology, any transcriptional errors that may result from process are unintentional.    Patient: Debra Myers  Service Category: E/M  Provider: Gaspar Cola, MD  DOB: September 25, 1965  DOS: 11/07/2019  Specialty: Interventional Pain Management  MRN: [id number]  Setting: Ambulatory outpatient  PCP: Elby Beck, FNP  Type: Established Patient    Referring Provider: Elby Beck, FNP  Location: Office  Delivery: Face-to-face     HPI  Reason for encounter: Debra Myers, a 54 y.o. year old female, is here today for evaluation and management of her Chronic pain syndrome [G89.4]. [name] primary complain today is Back Pain Last encounter: Practice (08/08/2019). My last encounter with her was on Visit date not found. Pertinent problems: [name] has Numbness of upper extremity; Radiculitis involving upper extremity; Chronic low back pain (Primary Area of Pain) (Bilateral) (L>R); Lumbar spondylosis (Bulging Disc & Severe Left Foraminal Stenosis at L3-4); Chronic lower extremity pain (Secondary area of Pain) (Left); Lumbar foraminal stenosis (Severe Left L3-4); Lumbar facet syndrome (Bilateral) (L>R); Cervical spondylosis (C5-6 & C6-7 DDD); Cervical (3 mm) Grade 1 Anterolisthesis of C4 over C5; Chronic neck pain (Third area of Pain) (Bilateral) (L>R); Chronic cervical radicular pain (Bilateral) (L>R); Cervical foraminal stenosis (multilevel) (Bilateral); Muscle spasm, nocturnal; Musculoskeletal pain; Carpal tunnel syndrome (Bilateral) (L>R); Cervical facet syndrome (Bilateral) (L>R); Chronic pain syndrome; DDD (degenerative disc disease), lumbar;  DDD (degenerative disc disease), cervical; Pain in right knee; Chronic pain of left knee; and Osteoarthritis involving multiple joints on their pertinent problem list. Pain Assessment: Severity of Chronic pain is reported as a 2 /10. Location: Back Left, Right/pain radiaties down both hips and down right leg. Onset: More than a month ago. Quality: Tingling, Spasm, Aching. Timing: Intermittent. Modifying factor(s): ice, meds, resting, heat, music, repositing. Vitals:  height is '5\' 4"'  (1.626 m) and weight is 240 lb (108.9 kg). Her temperature is 97.1 F (36.2 C) (abnormal). Her blood pressure is 133/78 and her pulse is 93. Her oxygen saturation is 99%.   Today's encounter is for follow-up evaluation and medication management. The patient indicates doing well with the current medication regimen. No adverse reactions or side effects reported to the medications.  The patient's last UDS was on 04/18/2018, this will be updated today.  Today the patient was provided with enough prescriptions to last until 02/07/2020.  Today we will transfer her muscle relaxant to her PCP.  Transfer: Metaxalone (Skelaxin) 800 mg tablet, 1 tablet p.o. every 8 hours as needed (90/month) (02/07/2020)  Pharmacotherapy Assessment   Analgesic: Hydrocodone/APAP 5/325, 2 tab PO QD (10 mg/day of hydrocodone) MME/day:10 mg/day.   Monitoring: [place] PMP: PDMP reviewed during this encounter.       Pharmacotherapy: No side-effects or adverse reactions reported. Compliance: No problems identified. Effectiveness: Clinically acceptable.  Chauncey Fischer, RN  11/07/2019 11:14 AM  Sign when Signing Visit Nursing Pain Medication Assessment:  Safety precautions to be maintained throughout the outpatient stay will include: orient to surroundings, keep bed in low position, maintain call bell within reach at all times, provide assistance with transfer out of bed and ambulation.  Medication Inspection Compliance: Pill count conducted under aseptic  conditions, in front of the patient. Neither the pills nor the  bottle was removed from the patient's sight at any time. Once count was completed pills were immediately returned to the patient in their original bottle.  Medication: Hydrocodone/APAP Pill/Patch Count: 49.5 of 60 pills remain Pill/Patch Appearance: Markings consistent with prescribed medication Bottle Appearance: Standard pharmacy container. Clearly labeled. Filled Date: 35 / 9 / 27 Last Medication intake:  TodaySafety precautions to be maintained throughout the outpatient stay will include: orient to surroundings, keep bed in low position, maintain call bell within reach at all times, provide assistance with transfer out of bed and ambulation.     UDS:  Summary  Date Value Ref Range Status  04/18/2018 FINAL  Final    Comment:    ==================================================================== TOXASSURE SELECT 13 ([facility]) ==================================================================== Test                             Result       Flag       Units Drug Present and Declared for Prescription Verification   Amphetamine                    1499         EXPECTED   ng/mg creat    Amphetamine is available as a schedule II prescription drug.   Lorazepam                      731          EXPECTED   ng/mg creat    Source of lorazepam is a scheduled prescription medication.   Hydrocodone                    536          EXPECTED   ng/mg creat   Dihydrocodeine                 96           EXPECTED   ng/mg creat   Norhydrocodone                 666          EXPECTED   ng/mg creat    Sources of hydrocodone include scheduled prescription    medications. Dihydrocodeine and norhydrocodone are expected    metabolites of hydrocodone. Dihydrocodeine is also available as a    scheduled prescription medication. ==================================================================== Test                      Result    Flag   Units      Ref Range    Creatinine              95               mg/dL      >=20 ==================================================================== Declared Medications:  The flagging and interpretation on this report are based on the  following declared medications.  Unexpected results may arise from  inaccuracies in the declared medications.  **Note: The testing scope of this panel includes these medications:  Amphetamine (Lisdexamfetamine)  Hydrocodone (Hydrocodone-Acetaminophen)  Lorazepam  **Note: The testing scope of this panel does not include following  reported medications:  Acetaminophen (Hydrocodone-Acetaminophen)  Albuterol  Aspirin (Aspirin 81)  Atorvastatin  Bupropion  Cetirizine  Fluticasone  Ibuprofen  Lamotrigine  Metaxalone  Metformin  Montelukast  Paroxetine  Spironolactone ==================================================================== For clinical consultation, please call [phone]. ====================================================================  ROS  Constitutional: Denies any fever or chills Gastrointestinal: No reported hemesis, hematochezia, vomiting, or acute GI distress Musculoskeletal: Denies any acute onset joint swelling, redness, loss of ROM, or weakness Neurological: No reported episodes of acute onset apraxia, aphasia, dysarthria, agnosia, amnesia, paralysis, loss of coordination, or loss of consciousness  Medication Review  HYDROcodone-acetaminophen, LORazepam, PARoxetine, albuterol, aspirin, atorvastatin, buPROPion, cetirizine, fluticasone, ibuprofen, lamoTRIgine, lisdexamfetamine, metFORMIN, metaxalone, montelukast, and spironolactone  History Review  Allergy: Ms. Chirino is allergic to oxycodone, penicillins, red dye, and sulfa antibiotics. Drug: Ms. Trang  reports no history of drug use. Alcohol:  reports no history of alcohol use. Tobacco:  reports that she has never smoked. She has never used smokeless tobacco. Social: Ms. Licklider   reports that she has never smoked. She has never used smokeless tobacco. She reports that she does not drink alcohol and does not use drugs. Medical:  has a past medical history of Anxiety, Asthma, Bulging lumbar disc (L3-4) (02/26/2015), Degenerative disc disease, Degenerative lumbar disc, Depression, Diabetes mellitus, Elective abortion, GERD (gastroesophageal reflux disease), Hypertension, Stroke ([place]) (06/11/2015), and Vaginal delivery. Surgical: Ms. Noxon  has a past surgical history that includes Cholecystectomy (2001); Lapband (04/2012); Wisdom tooth extraction; Dilatation & curettage/hysteroscopy with myosure (N/A, 09/10/2014); mirena; and lap band surgery. Family: family history includes Asthma in her maternal grandmother; Diabetes in her father; Hypertension in her father and mother; Stroke in her maternal grandfather and maternal grandmother.  Laboratory Chemistry Profile   Renal Lab Results  Component Value Date   BUN 13 02/08/2019   CREATININE 0.99 02/08/2019   BCR 15 05/26/2017   GFR 58.78 (L) 02/08/2019   GFRAA >60 12/16/2017   GFRNONAA >60 12/16/2017     Hepatic Lab Results  Component Value Date   AST 14 02/08/2019   ALT 15 02/08/2019   ALBUMIN 4.4 02/08/2019   ALKPHOS 108 02/08/2019   HCVAB NEGATIVE 05/18/2013     Electrolytes Lab Results  Component Value Date   NA 139 02/08/2019   K 4.1 02/08/2019   CL 104 02/08/2019   CALCIUM 9.1 02/08/2019   PHOS 4.0 10/31/2015     Bone Lab Results  Component Value Date   VD25OH 31.95 02/08/2019     Inflammation (CRP: Acute Phase) (ESR: Chronic Phase) Lab Results  Component Value Date   ESRSEDRATE 32 05/26/2017       Note: Above Lab results reviewed.  Recent Imaging Review  VAS [name] LOWER EXTREMITY VENOUS (DVT) ([facility] and [facility] 7a-7p)  Lower Venous Study  Indications: Pain.   Performing Technologist: Maudry Mayhew MHA, RDMS, RVT, RDCS    Examination Guidelines: A complete evaluation includes B-mode imaging,  spectral Doppler, color Doppler, and power Doppler as needed of all accessible portions of each vessel. Bilateral testing is considered an integral part of a complete examination. Limited examinations for reoccurring indications may be performed as noted.    Right Venous Findings: +---+---------------+---------+-----------+----------+-------+    CompressibilityPhasicitySpontaneityPropertiesSummary +---+---------------+---------+-----------+----------+-------+ CFVFull           Yes      Yes                          +---+---------------+---------+-----------+----------+-------+     Left Venous Findings: +---------+---------------+---------+-----------+----------+-------+          CompressibilityPhasicitySpontaneityPropertiesSummary +---------+---------------+---------+-----------+----------+-------+ CFV      Full           Yes      Yes                          +---------+---------------+---------+-----------+----------+-------+  SFJ      Full                                                 +---------+---------------+---------+-----------+----------+-------+ FV Prox  Full                                                 +---------+---------------+---------+-----------+----------+-------+ FV Mid   Full                                                 +---------+---------------+---------+-----------+----------+-------+ FV DistalFull                                                 +---------+---------------+---------+-----------+----------+-------+ PFV      Full                                                 +---------+---------------+---------+-----------+----------+-------+ POP      Full           Yes      Yes                          +---------+---------------+---------+-----------+----------+-------+ PTV      Full                                                  +---------+---------------+---------+-----------+----------+-------+ PERO     Full                                                 +---------+---------------+---------+-----------+----------+-------+          Summary: Right: No evidence of common femoral vein obstruction. Left: There is no evidence of deep vein thrombosis in the lower extremity. No cystic structure found in the popliteal fossa.   *See table(s) above for measurements and observations.  Electronically signed by Monica Martinez MD on 12/16/2017 at 6:04:46 PM.      Final   Note: Reviewed        Physical Exam  General appearance: Well nourished, well developed, and well hydrated. In no apparent acute distress Mental status: Alert, oriented x 3 (person, place, & time)       Respiratory: No evidence of acute respiratory distress Eyes: PERLA Vitals: BP 133/78   Pulse 93   Temp (!) 97.1 F (36.2 C)   Ht '5\' 4"'  (1.626 m)   Wt 240 lb (108.9 kg)   SpO2 99%   BMI 41.20 kg/m  BMI: Estimated body mass index is 41.2 kg/m as calculated  from the following:   Height as of this encounter: '5\' 4"'  (1.626 m).   Weight as of this encounter: 240 lb (108.9 kg). Ideal: Ideal body weight: 54.7 kg (120 lb 9.5 oz) Adjusted ideal body weight: 76.4 kg (168 lb 5.7 oz)  Assessment   Status Diagnosis  Controlled Controlled Controlled 1. Chronic pain syndrome   2. Pharmacologic therapy   3. Uncomplicated opioid dependence ([place])      Updated Problems: Problem  Uncomplicated Opioid Dependence ([facility])    Plan of Care  Problem-specific:  No problem-specific Assessment & Plan notes found for this encounter.  Ms. JONE PANEBIANCO has a current medication list which includes the following long-term medication(s): albuterol, atorvastatin, fluticasone, metaxalone, metformin, montelukast, spironolactone, [START ON 11/09/2019] hydrocodone-acetaminophen, [START ON 12/09/2019] hydrocodone-acetaminophen, and [START ON 01/08/2020]  hydrocodone-acetaminophen.  Pharmacotherapy (Medications Ordered): Meds ordered this encounter  Medications  . HYDROcodone-acetaminophen (NORCO/VICODIN) 5-325 MG tablet    Sig: Take 0.5-1 tablets by mouth 2 (two) times daily as needed for severe pain. Must last 30 days    Dispense:  60 tablet    Refill:  0    Chronic Pain: STOP Act (Not applicable) Fill 1 day early if closed on refill date. Avoid benzodiazepines within 8 hours of opioids  . HYDROcodone-acetaminophen (NORCO/VICODIN) 5-325 MG tablet    Sig: Take 0.5-1 tablets by mouth 2 (two) times daily as needed for severe pain. Must last 30 days    Dispense:  60 tablet    Refill:  0    Chronic Pain: STOP Act (Not applicable) Fill 1 day early if closed on refill date. Avoid benzodiazepines within 8 hours of opioids  . HYDROcodone-acetaminophen (NORCO/VICODIN) 5-325 MG tablet    Sig: Take 0.5-1 tablets by mouth 2 (two) times daily as needed for severe pain. Must last 30 days    Dispense:  60 tablet    Refill:  0    Chronic Pain: STOP Act (Not applicable) Fill 1 day early if closed on refill date. Avoid benzodiazepines within 8 hours of opioids   Orders:  Orders Placed This Encounter  Procedures  . ToxASSURE Select 13 (MW), Urine    Volume: 30 ml(s). Minimum 3 ml of urine is needed. Document temperature of fresh sample. Indications: Long term (current) use of opiate analgesic (P54.656)    Order Specific Question:   Release to patient    Answer:   Immediate   Follow-up plan:   Return in about 3 months (around 02/07/2020).      Interventional management options: Planned, scheduled, and/or pending:      Considering:   Diagnostic bilateral lumbar facet block  Possible bilateral lumbar facet RFA.  Diagnostic left L3-4 LESI  Diagnostic left L3 TFESI  Diagnostic left L4 TFESI  Diagnostic bilateral L5 TFESI  Diagnostic left CESI  Diagnostic bilateral cervical facet block  Possible bilateral cervical facet RFA.    Palliative PRN  treatment(s):   None at this time      Recent Visits No visits were found meeting these conditions. Showing recent visits within past 90 days and meeting all other requirements Today's Visits Date Type Provider Dept  11/07/19 Office Visit Milinda Pointer, MD [facility]  Showing today's visits and meeting all other requirements Future Appointments No visits were found meeting these conditions. Showing future appointments within next 90 days and meeting all other requirements  I discussed the assessment and treatment plan with the patient. The patient was provided an opportunity to ask questions and all  were answered. The patient agreed with the plan and demonstrated an understanding of the instructions.  Patient advised to call back or seek an in-person evaluation if the symptoms or condition worsens.  Duration of encounter: 30 minutes.  Note by: Gaspar Cola, MD Date: 11/07/2019; Time: 11:43 AM

## 2019-11-07 ENCOUNTER — Other Ambulatory Visit: Payer: Self-pay

## 2019-11-07 ENCOUNTER — Ambulatory Visit: Payer: Managed Care, Other (non HMO) | Attending: Pain Medicine | Admitting: Pain Medicine

## 2019-11-07 ENCOUNTER — Encounter: Payer: Self-pay | Admitting: Pain Medicine

## 2019-11-07 VITALS — BP 133/78 | HR 93 | Temp 97.1°F | Ht 64.0 in | Wt 240.0 lb

## 2019-11-07 DIAGNOSIS — F1120 Opioid dependence, uncomplicated: Secondary | ICD-10-CM | POA: Diagnosis not present

## 2019-11-07 DIAGNOSIS — Z79899 Other long term (current) drug therapy: Secondary | ICD-10-CM | POA: Diagnosis not present

## 2019-11-07 DIAGNOSIS — G894 Chronic pain syndrome: Secondary | ICD-10-CM

## 2019-11-07 MED ORDER — HYDROCODONE-ACETAMINOPHEN 5-325 MG PO TABS: 0.5000 | ORAL_TABLET | Freq: Two times a day (BID) | ORAL | 0 refills | Status: DC | PRN

## 2019-11-07 NOTE — Patient Instructions (Signed)
____________________________________________________________________________________________  Medication Rules  Purpose: To inform patients, and their family members, of our rules and regulations.  Applies to: All patients receiving prescriptions (written or electronic).  Pharmacy of record: Pharmacy where electronic prescriptions will be sent. If written prescriptions are taken to a different pharmacy, please inform the nursing staff. The pharmacy listed in the electronic medical record should be the one where you would like electronic prescriptions to be sent.  Electronic prescriptions: In compliance with the [place] Strengthen Opioid Misuse Prevention (STOP) Act of 2017 (Session Law 2017-74/H243), effective February 22, 2018, all controlled substances must be electronically prescribed. Calling prescriptions to the pharmacy will cease to exist.  Prescription refills: Only during scheduled appointments. Applies to all prescriptions.  NOTE: The following applies primarily to controlled substances (Opioid* Pain Medications).   Type of encounter (visit): For patients receiving controlled substances, face-to-face visits are required. (Not an option or up to the patient.)  Patient's responsibilities: 1. Pain Pills: Bring all pain pills to every appointment (except for procedure appointments). 2. Pill Bottles: Bring pills in original pharmacy bottle. Always bring the newest bottle. Bring bottle, even if empty. 3. Medication refills: You are responsible for knowing and keeping track of what medications you take and those you need refilled. The day before your appointment: write a list of all prescriptions that need to be refilled. The day of the appointment: give the list to the admitting nurse. Prescriptions will be written only during appointments. No prescriptions will be written on procedure days. If you forget a medication: it will not be "Called in", "Faxed", or "electronically sent".  You will need to get another appointment to get these prescribed. No early refills. Do not call asking to have your prescription filled early. 4. Prescription Accuracy: You are responsible for carefully inspecting your prescriptions before leaving our office. Have the discharge nurse carefully go over each prescription with you, before taking them home. Make sure that your name is accurately spelled, that your address is correct. Check the name and dose of your medication to make sure it is accurate. Check the number of pills, and the written instructions to make sure they are clear and accurate. Make sure that you are given enough medication to last until your next medication refill appointment. 5. Taking Medication: Take medication as prescribed. When it comes to controlled substances, taking less pills or less frequently than prescribed is permitted and encouraged. Never take more pills than instructed. Never take medication more frequently than prescribed.  6. Inform other Doctors: Always inform, all of your healthcare providers, of all the medications you take. 7. Pain Medication from other Providers: You are not allowed to accept any additional pain medication from any other Doctor or Healthcare provider. There are two exceptions to this rule. (see below) In the event that you require additional pain medication, you are responsible for notifying us, as stated below. 8. Medication Agreement: You are responsible for carefully reading and following our Medication Agreement. This must be signed before receiving any prescriptions from our practice. Safely store a copy of your signed Agreement. Violations to the Agreement will result in no further prescriptions. (Additional copies of our Medication Agreement are available upon request.) 9. Laws, Rules, & Regulations: All patients are expected to follow all Federal and State Laws, Statutes, Rules, & Regulations. Ignorance of the Laws does not constitute a  valid excuse.  10. Illegal drugs and Controlled Substances: The use of illegal substances (including, but not limited to marijuana and its   derivatives) and/or the illegal use of any controlled substances is strictly prohibited. Violation of this rule may result in the immediate and permanent discontinuation of any and all prescriptions being written by our practice. The use of any illegal substances is prohibited. 11. Adopted CDC guidelines & recommendations: Target dosing levels will be at or below 60 MME/day. Use of benzodiazepines** is not recommended.  Exceptions: There are only two exceptions to the rule of not receiving pain medications from other Healthcare Providers. 1. Exception #1 (Emergencies): In the event of an emergency (i.e.: accident requiring emergency care), you are allowed to receive additional pain medication. However, you are responsible for: As soon as you are able, call our office [phone], at any time of the day or night, and leave a message stating your name, the date and nature of the emergency, and the name and dose of the medication prescribed. In the event that your call is answered by a member of our staff, make sure to document and save the date, time, and the name of the person that took your information.  2. Exception #2 (Planned Surgery): In the event that you are scheduled by another doctor or dentist to have any type of surgery or procedure, you are allowed (for a period no longer than 30 days), to receive additional pain medication, for the acute post-op pain. However, in this case, you are responsible for picking up a copy of our "Post-op Pain Management for Surgeons" handout, and giving it to your surgeon or dentist. This document is available at our office, and does not require an appointment to obtain it. Simply go to our office during business hours (Monday-Thursday from 8:00 AM to 4:00 PM) (Friday 8:00 AM to 12:00 Noon) or if you have a scheduled appointment  with us, prior to your surgery, and ask for it by name. In addition, you are responsible for: calling our office [phone], at any time of the day or night, and leaving a message stating your name, name of your surgeon, type of surgery, and date of procedure or surgery. Failure to comply with your responsibilities may result in termination of therapy involving the controlled substances.  *Opioid medications include: morphine, codeine, oxycodone, oxymorphone, hydrocodone, hydromorphone, meperidine, tramadol, tapentadol, buprenorphine, fentanyl, methadone. **Benzodiazepine medications include: diazepam (Valium), alprazolam (Xanax), clonazepam (Klonopine), lorazepam (Ativan), clorazepate (Tranxene), chlordiazepoxide (Librium), estazolam (Prosom), oxazepam (Serax), temazepam (Restoril), triazolam (Halcion) (Last updated: 10/30/2019) ____________________________________________________________________________________________   ____________________________________________________________________________________________  Medication Recommendations and Reminders  Applies to: All patients receiving prescriptions (written and/or electronic).  Medication Rules & Regulations: These rules and regulations exist for your safety and that of others. They are not flexible and neither are we. Dismissing or ignoring them will be considered "non-compliance" with medication therapy, resulting in complete and irreversible termination of such therapy. (See document titled "Medication Rules" for more details.) In all conscience, because of safety reasons, we cannot continue providing a therapy where the patient does not follow instructions.  Pharmacy of record:   Definition: This is the pharmacy where your electronic prescriptions will be sent.   We do not endorse any particular pharmacy, however, we have experienced problems with [facility] not securing enough medication supply for the community.  We do not  restrict you in your choice of pharmacy. However, once we write for your prescriptions, we will NOT be re-sending more prescriptions to fix restricted supply problems created by your pharmacy, or your insurance.   The pharmacy listed in the electronic medical record should be the   one where you want electronic prescriptions to be sent.  If you choose to change pharmacy, simply notify our nursing staff.  Recommendations:  Keep all of your pain medications in a safe place, under lock and key, even if you live alone. We will NOT replace lost, stolen, or damaged medication.  After you fill your prescription, take 1 week's worth of pills and put them away in a safe place. You should keep a separate, properly labeled bottle for this purpose. The remainder should be kept in the original bottle. Use this as your primary supply, until it runs out. Once it's gone, then you know that you have 1 week's worth of medicine, and it is time to come in for a prescription refill. If you do this correctly, it is unlikely that you will ever run out of medicine.  To make sure that the above recommendation works, it is very important that you make sure your medication refill appointments are scheduled at least 1 week before you run out of medicine. To do this in an effective manner, make sure that you do not leave the office without scheduling your next medication management appointment. Always ask the nursing staff to show you in your prescription , when your medication will be running out. Then arrange for the receptionist to get you a return appointment, at least 7 days before you run out of medicine. Do not wait until you have 1 or 2 pills left, to come in. This is very poor planning and does not take into consideration that we may need to cancel appointments due to bad weather, sickness, or emergencies affecting our staff.  DO NOT ACCEPT A "Partial Fill": If for any reason your pharmacy does not have enough pills/tablets  to completely fill or refill your prescription, do not allow for a "partial fill". The law allows the pharmacy to complete that prescription within 72 hours, without requiring a new prescription. If they do not fill the rest of your prescription within those 72 hours, you will need a separate prescription to fill the remaining amount, which we will NOT provide. If the reason for the partial fill is your insurance, you will need to talk to the pharmacist about payment alternatives for the remaining tablets, but again, DO NOT ACCEPT A PARTIAL FILL, unless you can trust your pharmacist to obtain the remainder of the pills within 72 hours.  Prescription refills and/or changes in medication(s):   Prescription refills, and/or changes in dose or medication, will be conducted only during scheduled medication management appointments. (Applies to both, written and electronic prescriptions.)  No refills on procedure days. No medication will be changed or started on procedure days. No changes, adjustments, and/or refills will be conducted on a procedure day. Doing so will interfere with the diagnostic portion of the procedure.  No phone refills. No medications will be "called into the pharmacy".  No Fax refills.  No weekend refills.  No Holliday refills.  No after hours refills.  Remember:  Business hours are:  Monday to Thursday 8:00 AM to 4:00 PM Provider's Schedule: Jeriyah Granlund, MD - Appointments are:  Medication management: Monday and Wednesday 8:00 AM to 4:00 PM Procedure day: Tuesday and Thursday 7:30 AM to 4:00 PM Bilal Lateef, MD - Appointments are:  Medication management: Tuesday and Thursday 8:00 AM to 4:00 PM Procedure day: Monday and Wednesday 7:30 AM to 4:00 PM (Last update: 09/12/2019) ____________________________________________________________________________________________  ____________________________________________________________________________________________  CBD  (cannabidiol) WARNING  Applicable to: All individuals currently taking   or considering taking CBD (cannabidiol) and, more important, all patients taking opioid analgesic controlled substances (pain medication). (Example: oxycodone; oxymorphone; hydrocodone; hydromorphone; morphine; methadone; tramadol; tapentadol; fentanyl; buprenorphine; butorphanol; dextromethorphan; meperidine; codeine; etc.)  Legal status: CBD remains a Schedule I drug prohibited for any use. CBD is illegal with one exception. In the [place], CBD has a limited Food and Drug Administration (FDA) approval for the treatment of two specific types of epilepsy disorders. Only one CBD product has been approved by the FDA for this purpose: "Epidiolex". FDA is aware that some companies are marketing products containing cannabis and cannabis-derived compounds in ways that violate the Federal Food, Drug and Cosmetic Act (FD&C Act) and that may put the health and safety of consumers at risk. The FDA, a Federal agency, has not enforced the CBD status since 2018.   Legality: Some manufacturers ship CBD products nationally, which is illegal. Often such products are sold online and are therefore available throughout the country. CBD is openly sold in head shops and health food stores in some states where such sales have not been explicitly legalized. Selling unapproved products with unsubstantiated therapeutic claims is not only a violation of the law, but also can put patients at risk, as these products have not been proven to be safe or effective. Federal illegality makes it difficult to conduct research on CBD.  Reference: "FDA Regulation of Cannabis and Cannabis-Derived Products, Including Cannabidiol (CBD)" - https://www.fda.gov/news-events/public-health-focus/fda-regulation-cannabis-and-cannabis-derived-products-including-cannabidiol-cbd  Warning: CBD is not FDA approved and has not undergo the same manufacturing controls as prescription  drugs.  This means that the purity and safety of available CBD may be questionable. Most of the time, despite manufacturer's claims, it is contaminated with THC (delta-9-tetrahydrocannabinol - the chemical in marijuana responsible for the "HIGH").  When this is the case, the THC contaminant will trigger a positive urine drug screen (UDS) test for Marijuana (carboxy-THC). Because a positive UDS for any illicit substance is a violation of our medication agreement, your opioid analgesics (pain medicine) may be permanently discontinued.  MORE ABOUT CBD  General Information: CBD  is a derivative of the Marijuana (cannabis sativa) plant discovered in 1940. It is one of the 113 identified substances found in Marijuana. It accounts for up to 40% of the plant's extract. As of 2018, preliminary clinical studies on CBD included research for the treatment of anxiety, movement disorders, and pain. CBD is available and consumed in multiple forms, including inhalation of smoke or vapor, as an aerosol spray, and by mouth. It may be supplied as an oil containing CBD, capsules, dried cannabis, or as a liquid solution. CBD is thought not to be as psychoactive as THC (delta-9-tetrahydrocannabinol - the chemical in marijuana responsible for the "HIGH"). Studies suggest that CBD may interact with different biological target receptors in the body, including cannabinoid and other neurotransmitter receptors. As of 2018 the mechanism of action for its biological effects has not been determined.  Side-effects  Adverse reactions: Dry mouth, diarrhea, decreased appetite, fatigue, drowsiness, malaise, weakness, sleep disturbances, and others.  Drug interactions: CBC may interact with other medications such as blood-thinners. (Last update:  09/29/2019) ____________________________________________________________________________________________   ____________________________________________________________________________________________  Drug Holidays (Slow)  What is a "Drug Holiday"? Drug Holiday: is the name given to the period of time during which a patient stops taking a medication(s) for the purpose of eliminating tolerance to the drug.  Benefits . Improved effectiveness of opioids. . Decreased opioid dose needed to achieve benefits. . Improved pain with lesser dose.  What   is tolerance? Tolerance: is the progressive decreased in effectiveness of a drug due to its repetitive use. With repetitive use, the body gets use to the medication and as a consequence, it loses its effectiveness. This is a common problem seen with opioid pain medications. As a result, a larger dose of the drug is needed to achieve the same effect that used to be obtained with a smaller dose.  How long should a "Drug Holiday" last? You should stay off of the pain medicine for at least 14 consecutive days. (2 weeks)  Should I stop the medicine "cold turkey"? No. You should always coordinate with your Pain Specialist so that he/she can provide you with the correct medication dose to make the transition as smoothly as possible.  How do I stop the medicine? Slowly. You will be instructed to decrease the daily amount of pills that you take by one (1) pill every seven (7) days. This is called a "slow downward taper" of your dose. For example: if you normally take four (4) pills per day, you will be asked to drop this dose to three (3) pills per day for seven (7) days, then to two (2) pills per day for seven (7) days, then to one (1) per day for seven (7) days, and at the end of those last seven (7) days, this is when the "Drug Holiday" would start.   Will I have withdrawals? By doing a "slow downward taper" like this one, it is unlikely that you will  experience any significant withdrawal symptoms. Typically, what triggers withdrawals is the sudden stop of a high dose opioid therapy. Withdrawals can usually be avoided by slowly decreasing the dose over a prolonged period of time. If you do not follow these instructions and decide to stop your medication abruptly, withdrawals may be possible.  What are withdrawals? Withdrawals: refers to the wide range of symptoms that occur after stopping or dramatically reducing opiate drugs after heavy and prolonged use. Withdrawal symptoms do not occur to patients that use low dose opioids, or those who take the medication sporadically. Contrary to benzodiazepine (example: Valium, Xanax, etc.) or alcohol withdrawals ("Delirium Tremens"), opioid withdrawals are not lethal. Withdrawals are the physical manifestation of the body getting rid of the excess receptors.  Expected Symptoms Early symptoms of withdrawal may include: . Agitation . Anxiety . Muscle aches . Increased tearing . Insomnia . Runny nose . Sweating . Yawning  Late symptoms of withdrawal may include: . Abdominal cramping . Diarrhea . Dilated pupils . Goose bumps . Nausea . Vomiting  Will I experience withdrawals? Due to the slow nature of the taper, it is very unlikely that you will experience any.  What is a slow taper? Taper: refers to the gradual decrease in dose.  (Last update: 09/12/2019) ____________________________________________________________________________________________      

## 2019-11-07 NOTE — Progress Notes (Signed)
Nursing Pain Medication Assessment:  Safety precautions to be maintained throughout the outpatient stay will include: orient to surroundings, keep bed in low position, maintain call bell within reach at all times, provide assistance with transfer out of bed and ambulation.  Medication Inspection Compliance: Pill count conducted under aseptic conditions, in front of the patient. Neither the pills nor the bottle was removed from the patient's sight at any time. Once count was completed pills were immediately returned to the patient in their original bottle.  Medication: Hydrocodone/APAP Pill/Patch Count: 49.5 of 60 pills remain Pill/Patch Appearance: Markings consistent with prescribed medication Bottle Appearance: Standard pharmacy container. Clearly labeled. Filled Date: 64 / 9 / 74 Last Medication intake:  TodaySafety precautions to be maintained throughout the outpatient stay will include: orient to surroundings, keep bed in low position, maintain call bell within reach at all times, provide assistance with transfer out of bed and ambulation.

## 2019-11-09 ENCOUNTER — Other Ambulatory Visit: Payer: Self-pay | Admitting: Family Medicine

## 2019-11-09 LAB — TOXASSURE SELECT 13 (MW), URINE

## 2019-11-23 ENCOUNTER — Other Ambulatory Visit: Payer: Self-pay

## 2019-11-23 ENCOUNTER — Ambulatory Visit (INDEPENDENT_AMBULATORY_CARE_PROVIDER_SITE_OTHER): Payer: Managed Care, Other (non HMO) | Admitting: Primary Care

## 2019-11-23 ENCOUNTER — Encounter: Payer: Self-pay | Admitting: Primary Care

## 2019-11-23 VITALS — BP 132/74 | HR 94 | Temp 97.8°F | Ht 64.0 in | Wt 229.0 lb

## 2019-11-23 DIAGNOSIS — I10 Essential (primary) hypertension: Secondary | ICD-10-CM

## 2019-11-23 DIAGNOSIS — R7303 Prediabetes: Secondary | ICD-10-CM

## 2019-11-23 DIAGNOSIS — F5081 Binge eating disorder: Secondary | ICD-10-CM

## 2019-11-23 DIAGNOSIS — E781 Pure hyperglyceridemia: Secondary | ICD-10-CM

## 2019-11-23 DIAGNOSIS — G894 Chronic pain syndrome: Secondary | ICD-10-CM

## 2019-11-23 DIAGNOSIS — L680 Hirsutism: Secondary | ICD-10-CM

## 2019-11-23 DIAGNOSIS — Z1211 Encounter for screening for malignant neoplasm of colon: Secondary | ICD-10-CM

## 2019-11-23 DIAGNOSIS — F411 Generalized anxiety disorder: Secondary | ICD-10-CM

## 2019-11-23 DIAGNOSIS — J309 Allergic rhinitis, unspecified: Secondary | ICD-10-CM | POA: Diagnosis not present

## 2019-11-23 DIAGNOSIS — E282 Polycystic ovarian syndrome: Secondary | ICD-10-CM

## 2019-11-23 DIAGNOSIS — J4520 Mild intermittent asthma, uncomplicated: Secondary | ICD-10-CM

## 2019-11-23 DIAGNOSIS — Z8673 Personal history of transient ischemic attack (TIA), and cerebral infarction without residual deficits: Secondary | ICD-10-CM

## 2019-11-23 MED ORDER — FLUTICASONE PROPIONATE 50 MCG/ACT NA SUSP: 1.0000 | Freq: Two times a day (BID) | NASAL | 0 refills | Status: DC | PRN

## 2019-11-23 NOTE — Assessment & Plan Note (Signed)
Managed on spironolactone 25 mg for PCOS, no other medications for hypertension.  Overall blood pressure decently controlled on this regimen.  Continue to monitor.  CMP pending.

## 2019-11-23 NOTE — Progress Notes (Signed)
Subjective:    Patient ID: Debra Myers, female    DOB: Feb 19, 1966, 54 y.o.   MRN: [id number]  HPI  This visit occurred during the SARS-CoV-2 public health emergency.  Safety protocols were in place, including screening questions prior to the visit, additional usage of staff PPE, and extensive cleaning of exam room while observing appropriate contact time as indicated for disinfecting solutions.   Debra Myers is a 54 year old female patient of Debra Myers who presents today to transfer care.  1) Hypertension: Currently managed on spironolactone 25 mg, but is taking this more so for PCOS.   2) Prediabetes: Currently managed on metformin 500 mg once daily. Last A1C was 6.0 in December 2020. She denies a prior history of type 2 diabetes.   3) Chronic Back Pain: History of cervical and lumbar spondylosis, cervical and lumbar DDD, chronic pain syndrome, osteoarthritis, long term opioid use. Following with pain management and is taking hydrocodone-acetaminophen 5-325 mg, Skelaxin.   Recent visit was in mid September 2021 and she was told to ask PCP for refills of Skelaxin. She takes her Skelaxin twice daily most days, sometimes once daily. Sometimes takes Motrin if needed.   She also follows with a chiropractor.   4) GAD/Binge Eating Disorder: Currently managed on Vyvanse 40 mg, paroxetine CR 25 mg, Wellbutrin XL 300 mg, lorazepam, Lamictal 200 mg. Currently following with psychiatry and therapy.  Overall she feels ewll managed.   BP Readings from Last 3 Encounters:  11/23/19 132/74  11/07/19 133/78  03/30/19 122/78   5) TIA/Hyperlipidemia: Currently managed on atorvastatin 10 mg. She's actually not sure if she had a TIA as work up for her symptoms at the time was negative. She thinks her symptoms were caused by anxiety.   6) Asthma/Allergic rhinitis: Currently managed on Singulair 10 mg, Zyrtec, Flonase, and albuterol inhaler PRN. No asthma exacerbation in years. No recent use of  albuterol inhaler.   Review of Systems  Eyes: Negative for visual disturbance.  Respiratory: Negative for shortness of breath.   Cardiovascular: Negative for chest pain.  Musculoskeletal: Positive for arthralgias and back pain.  Neurological: Negative for dizziness and headaches.  Psychiatric/Behavioral:       Follows with psychiatry, overall feels well managed.        Past Medical History:  Diagnosis Date  . Anxiety   . Asthma    ALLERGY INDUCED  . Bulging lumbar disc (L3-4) 02/26/2015  . Degenerative disc disease   . Degenerative lumbar disc   . Depression   . Diabetes mellitus    TYPE 2  . Elective abortion    ONE  . GERD (gastroesophageal reflux disease)    takes prilosec   . Hypertension   . Stroke (HCC) 06/11/2015   patient describes as mini stroke  . Vaginal delivery    ONE NSVD     Social History   Socioeconomic History  . Marital status: Married    Spouse name: Not on file  . Number of children: Not on file  . Years of education: Not on file  . Highest education level: Not on file  Occupational History  . Not on file  Tobacco Use  . Smoking status: Never Smoker  . Smokeless tobacco: Never Used  Vaping Use  . Vaping Use: Never used  Substance and Sexual Activity  . Alcohol use: Never    Alcohol/week: 0.0 standard drinks  . Drug use: No  . Sexual activity: Yes    Birth control/protection:  I.U.D.    Comment: 1st intercourse 54 yo-Fewer than 5 partners-Mirena  inserted 09-2014  Other Topics Concern  . Not on file  Social History Narrative  . Not on file   Social Determinants of Health   Financial Resource Strain:   . Difficulty of Paying Living Expenses: Not on file  Food Insecurity:   . Worried About Programme researcher, broadcasting/film/video in the Last Year: Not on file  . Ran Out of Food in the Last Year: Not on file  Transportation Needs:   . Lack of Transportation (Medical): Not on file  . Lack of Transportation (Non-Medical): Not on file  Physical Activity:     . Days of Exercise per Week: Not on file  . Minutes of Exercise per Session: Not on file  Stress:   . Feeling of Stress : Not on file  Social Connections:   . Frequency of Communication with Friends and Family: Not on file  . Frequency of Social Gatherings with Friends and Family: Not on file  . Attends Religious Services: Not on file  . Active Member of Clubs or Organizations: Not on file  . Attends Banker Meetings: Not on file  . Marital Status: Not on file  Intimate Partner Violence:   . Fear of Current or Ex-Partner: Not on file  . Emotionally Abused: Not on file  . Physically Abused: Not on file  . Sexually Abused: Not on file    Past Surgical History:  Procedure Laterality Date  . CHOLECYSTECTOMY  2001  . DILATATION & CURETTAGE/HYSTEROSCOPY WITH MYOSURE N/A 09/10/2014   Procedure: DILATATION & CURETTAGE/HYSTEROSCOPY WITH MYOSURE/INSERTION OF IUD;  Surgeon: Ok Edwards, MD;  Location: [facility];  Service: Gynecology;  Laterality: N/A;  . lap band surgery    . Lapband  04/2012  . mirena     inserted 09-2014  . WISDOM TOOTH EXTRACTION      Family History  Problem Relation Age of Onset  . Hypertension Father   . Diabetes Father   . Hypertension Mother   . Asthma Maternal Grandmother   . Stroke Maternal Grandmother   . Stroke Maternal Grandfather     Allergies  Allergen Reactions  . Oxycodone Itching  . Penicillins Other (See Comments)    Reaction:  GI upset  Has patient had a PCN reaction causing immediate rash, facial/tongue/throat swelling, SOB or lightheadedness with hypotension: No Has patient had a PCN reaction causing severe rash involving mucus membranes or skin necrosis: No Has patient had a PCN reaction that required hospitalization No Has patient had a PCN reaction occurring within the last 10 years: No If all of the above answers are "NO", then may proceed with Cephalosporin use.  . Red Dye     Numb tongue  . Sulfa Antibiotics Itching  and Rash    Current Outpatient Medications on File Prior to Visit  Medication Sig Dispense Refill  . albuterol (VENTOLIN HFA) 108 (90 Base) MCG/ACT inhaler INHALE 2 PUFFS BY MOUTH INTO THE LUNGS EVERY 6 HOURS IF NEEDED FOR WHEEZING OR SHORTNESS OF BREATH 18 g 3  . atorvastatin (LIPITOR) 10 MG tablet TAKE 1 TABLET(10 MG) BY MOUTH DAILY 90 tablet 2  . buPROPion (WELLBUTRIN XL) 300 MG 24 hr tablet Take 300 mg by mouth daily.    . cetirizine (ZYRTEC) 10 MG tablet Take 10 mg by mouth at bedtime.    . fluticasone (FLONASE) 50 MCG/ACT nasal spray SHAKE LIQUID AND USE 2 SPRAYS IN EACH NOSTRIL DAILY  AS NEEDED FOR RHINITIS 16 g 3  . [START ON 12/09/2019] HYDROcodone-acetaminophen (NORCO/VICODIN) 5-325 MG tablet Take 0.5-1 tablets by mouth 2 (two) times daily as needed for severe pain. Must last 30 days 60 tablet 0  . [START ON 01/08/2020] HYDROcodone-acetaminophen (NORCO/VICODIN) 5-325 MG tablet Take 0.5-1 tablets by mouth 2 (two) times daily as needed for severe pain. Must last 30 days 60 tablet 0  . ibuprofen (ADVIL) 200 MG tablet Take 400 mg by mouth every 8 (eight) hours as needed.    . lamoTRIgine (LAMICTAL) 200 MG tablet Take 300 mg by mouth at bedtime.     Marland Kitchen LORazepam (ATIVAN) 1 MG tablet Take 0.5-1 mg by mouth every 8 (eight) hours as needed for anxiety or sleep.     . metaxalone (SKELAXIN) 800 MG tablet Take 1 tablet (800 mg total) by mouth every 8 (eight) hours as needed for muscle spasms. Max: 3/day 90 tablet 5  . metFORMIN (GLUCOPHAGE) 500 MG tablet TAKE 1 TABLET BY MOUTH EVERY DAY WITH BREAKFAST 60 tablet 0  . montelukast (SINGULAIR) 10 MG tablet Take 1 tablet (10 mg total) by mouth at bedtime. 90 tablet 2  . PARoxetine (PAXIL-CR) 25 MG 24 hr tablet Take 50 mg by mouth at bedtime.     Marland Kitchen RA ASPIRIN EC ADULT LOW ST 81 MG EC tablet take 1 tablet by mouth once daily 30 tablet 0  . spironolactone (ALDACTONE) 25 MG tablet TAKE 1 TABLET(25 MG) BY MOUTH DAILY 30 tablet 0  . VYVANSE 40 MG capsule Take  40 mg by mouth daily.  0   No current facility-administered medications on file prior to visit.    BP 132/74   Pulse 94   Temp 97.8 F (36.6 C) (Temporal)   Ht 5\' 4"  (1.626 m)   Wt 229 lb (103.9 kg)   SpO2 97%   BMI 39.31 kg/m    Objective:   Physical Exam Cardiovascular:     Rate and Rhythm: Normal rate and regular rhythm.  Pulmonary:     Effort: Pulmonary effort is normal.     Breath sounds: Normal breath sounds.  Musculoskeletal:     Cervical back: Neck supple.  Skin:    General: Skin is warm and dry.  Psychiatric:        Mood and Affect: Mood normal.            Assessment & Plan:

## 2019-11-23 NOTE — Patient Instructions (Signed)
Stop by the lab prior to leaving today. I will notify you of your results once received.   You will be contacted regarding your referral to GI for the colonoscopy.  Please let us know if you have not been contacted within two weeks.   Sign up for My Chart!  It was a pleasure to see you today!

## 2019-11-23 NOTE — Assessment & Plan Note (Signed)
Compliant to statin therapy.  Repeat lipid panel pending.

## 2019-11-23 NOTE — Assessment & Plan Note (Signed)
Patient is unsure that she actually had a TIA, she believes her symptoms are from anxiety.  Continue statin therapy.  Lipid panel pending.

## 2019-11-23 NOTE — Assessment & Plan Note (Addendum)
Following with psychiatry, overall feels well managed. Continue current regimen.  

## 2019-11-23 NOTE — Assessment & Plan Note (Signed)
Following with pain management, overall feels well managed.  She will need Skelaxin refilled for which I agreed to do.  She will notify when she is in need of a refill.

## 2019-11-23 NOTE — Assessment & Plan Note (Signed)
Managed on spironolactone 25 mg.  Continue same.

## 2019-11-23 NOTE — Assessment & Plan Note (Signed)
Overall stable on Singulair, Zyrtec, Flonase.  Continue same.

## 2019-11-23 NOTE — Assessment & Plan Note (Signed)
No wheezing on exam.  Infrequent use of albuterol inhaler.  Continue Singulair.

## 2019-11-23 NOTE — Assessment & Plan Note (Signed)
Managed on Vyvanse per psychiatry.  Continue to monitor.

## 2019-11-23 NOTE — Assessment & Plan Note (Signed)
Repeat A1c pending.  Continue Metformin.

## 2019-11-23 NOTE — Assessment & Plan Note (Signed)
Managed on spironolactone and Metformin.  Repeat A1c and CMP pending.  Continue same.

## 2019-11-24 LAB — CBC
HCT: 40.1 % (ref 35.0–45.0)
Hemoglobin: 13.5 g/dL (ref 11.7–15.5)
MCH: 29.3 pg (ref 27.0–33.0)
MCHC: 33.7 g/dL (ref 32.0–36.0)
MCV: 87.2 fL (ref 80.0–100.0)
MPV: 10.2 fL (ref 7.5–12.5)
Platelets: 266 10*3/uL (ref 140–400)
RBC: 4.6 10*6/uL (ref 3.80–5.10)
RDW: 12.8 % (ref 11.0–15.0)
WBC: 8.5 10*3/uL (ref 3.8–10.8)

## 2019-11-24 LAB — COMPREHENSIVE METABOLIC PANEL
AG Ratio: 2 (calc) (ref 1.0–2.5)
ALT: 14 U/L (ref 6–29)
AST: 13 U/L (ref 10–35)
Albumin: 4.5 g/dL (ref 3.6–5.1)
Alkaline phosphatase (APISO): 100 U/L (ref 37–153)
BUN: 15 mg/dL (ref 7–25)
CO2: 27 mmol/L (ref 20–32)
Calcium: 9.3 mg/dL (ref 8.6–10.4)
Chloride: 105 mmol/L (ref 98–110)
Creat: 0.92 mg/dL (ref 0.50–1.05)
Globulin: 2.3 g/dL (calc) (ref 1.9–3.7)
Glucose, Bld: 80 mg/dL (ref 65–99)
Potassium: 4.8 mmol/L (ref 3.5–5.3)
Sodium: 140 mmol/L (ref 135–146)
Total Bilirubin: 0.4 mg/dL (ref 0.2–1.2)
Total Protein: 6.8 g/dL (ref 6.1–8.1)

## 2019-11-24 LAB — HEMOGLOBIN A1C
Hgb A1c MFr Bld: 5.7 % of total Hgb — ABNORMAL HIGH (ref <5.7)
Mean Plasma Glucose: 117 (calc)
eAG (mmol/L): 6.5 (calc)

## 2019-11-24 LAB — LIPID PANEL
Cholesterol: 141 mg/dL (ref <200)
HDL: 55 mg/dL (ref >=50)
LDL Cholesterol (Calc): 69 mg/dL (calc)
Non-HDL Cholesterol (Calc): 86 mg/dL (calc) (ref <130)
Total CHOL/HDL Ratio: 2.6 (calc) (ref <5.0)
Triglycerides: 89 mg/dL (ref <150)

## 2019-11-24 LAB — TSH: TSH: 0.7 mIU/L

## 2019-12-09 ENCOUNTER — Other Ambulatory Visit: Payer: Self-pay | Admitting: Family Medicine

## 2019-12-17 ENCOUNTER — Telehealth: Payer: Self-pay | Admitting: Primary Care

## 2019-12-18 ENCOUNTER — Other Ambulatory Visit: Payer: Self-pay | Admitting: Family Medicine

## 2019-12-20 ENCOUNTER — Telehealth: Payer: Self-pay

## 2019-12-20 NOTE — Telephone Encounter (Signed)
Ok with Dr. Laban Emperor. Offered for patient to come today before 2pm. She is unable to do this. Will come Monday.  Please add to Dr. Waynetta Sandy Monday schedule. I told her to come at 1030.

## 2019-12-20 NOTE — Telephone Encounter (Signed)
She wants to know if she can get toredol norflex shot for hip pain. She wants to see if she can come in this afternoon and do it. If not today then Monday?

## 2019-12-24 ENCOUNTER — Encounter: Payer: Self-pay | Admitting: Pain Medicine

## 2019-12-24 ENCOUNTER — Other Ambulatory Visit: Payer: Self-pay

## 2019-12-24 ENCOUNTER — Ambulatory Visit: Payer: Managed Care, Other (non HMO) | Attending: Pain Medicine | Admitting: Pain Medicine

## 2019-12-24 VITALS — BP 137/78 | HR 90 | Temp 96.8°F | Ht 64.0 in | Wt 235.0 lb

## 2019-12-24 DIAGNOSIS — M545 Low back pain, unspecified: Secondary | ICD-10-CM | POA: Diagnosis not present

## 2019-12-24 DIAGNOSIS — M7918 Myalgia, other site: Secondary | ICD-10-CM

## 2019-12-24 DIAGNOSIS — M542 Cervicalgia: Secondary | ICD-10-CM | POA: Diagnosis not present

## 2019-12-24 DIAGNOSIS — G894 Chronic pain syndrome: Secondary | ICD-10-CM

## 2019-12-24 DIAGNOSIS — M79605 Pain in left leg: Secondary | ICD-10-CM

## 2019-12-24 DIAGNOSIS — M62838 Other muscle spasm: Secondary | ICD-10-CM

## 2019-12-24 DIAGNOSIS — G8929 Other chronic pain: Secondary | ICD-10-CM

## 2019-12-24 MED ORDER — HYDROCODONE-ACETAMINOPHEN 5-325 MG PO TABS: 0.5000 | ORAL_TABLET | Freq: Two times a day (BID) | ORAL | 0 refills | Status: DC | PRN

## 2019-12-24 MED ORDER — METAXALONE 800 MG PO TABS: 800.0000 mg | ORAL_TABLET | Freq: Three times a day (TID) | ORAL | 2 refills | Status: DC

## 2019-12-24 MED ORDER — ORPHENADRINE CITRATE 30 MG/ML IJ SOLN
60.0000 mg | Freq: Once | INTRAMUSCULAR | Status: AC
  Administered 2019-12-24: 60 mg via INTRAMUSCULAR
  Filled 2019-12-24: qty 2

## 2019-12-24 MED ORDER — ORPHENADRINE CITRATE 30 MG/ML IJ SOLN
INTRAMUSCULAR | Status: AC
  Filled 2019-12-24: qty 2

## 2019-12-24 MED ORDER — KETOROLAC TROMETHAMINE 60 MG/2ML IM SOLN
60.0000 mg | Freq: Once | INTRAMUSCULAR | Status: AC
  Administered 2019-12-24: 60 mg via INTRAMUSCULAR
  Filled 2019-12-24: qty 2

## 2019-12-24 MED ORDER — KETOROLAC TROMETHAMINE 60 MG/2ML IM SOLN
INTRAMUSCULAR | Status: AC
  Filled 2019-12-24: qty 2

## 2019-12-24 NOTE — Patient Instructions (Signed)
____________________________________________________________________________________________  CBD (cannabidiol) WARNING  Applicable to: All individuals currently taking or considering taking CBD (cannabidiol) and, more important, all patients taking opioid analgesic controlled substances (pain medication). (Example: oxycodone; oxymorphone; hydrocodone; hydromorphone; morphine; methadone; tramadol; tapentadol; fentanyl; buprenorphine; butorphanol; dextromethorphan; meperidine; codeine; etc.)  Legal status: CBD remains a Schedule I drug prohibited for any use. CBD is illegal with one exception. In the [place], CBD has a limited Food and Drug Administration (FDA) approval for the treatment of two specific types of epilepsy disorders. Only one CBD product has been approved by the FDA for this purpose: "Epidiolex". FDA is aware that some companies are marketing products containing cannabis and cannabis-derived compounds in ways that violate the Federal Food, Drug and Cosmetic Act (FD&C Act) and that may put the health and safety of consumers at risk. The FDA, a Federal agency, has not enforced the CBD status since 2018.   Legality: Some manufacturers ship CBD products nationally, which is illegal. Often such products are sold online and are therefore available throughout the country. CBD is openly sold in head shops and health food stores in some states where such sales have not been explicitly legalized. Selling unapproved products with unsubstantiated therapeutic claims is not only a violation of the law, but also can put patients at risk, as these products have not been proven to be safe or effective. Federal illegality makes it difficult to conduct research on CBD.  Reference: "FDA Regulation of Cannabis and Cannabis-Derived Products, Including Cannabidiol (CBD)" -  https://www.fda.gov/news-events/public-health-focus/fda-regulation-cannabis-and-cannabis-derived-products-including-cannabidiol-cbd  Warning: CBD is not FDA approved and has not undergo the same manufacturing controls as prescription drugs.  This means that the purity and safety of available CBD may be questionable. Most of the time, despite manufacturer's claims, it is contaminated with THC (delta-9-tetrahydrocannabinol - the chemical in marijuana responsible for the "HIGH").  When this is the case, the THC contaminant will trigger a positive urine drug screen (UDS) test for Marijuana (carboxy-THC). Because a positive UDS for any illicit substance is a violation of our medication agreement, your opioid analgesics (pain medicine) may be permanently discontinued.  MORE ABOUT CBD  General Information: CBD  is a derivative of the Marijuana (cannabis sativa) plant discovered in 1940. It is one of the 113 identified substances found in Marijuana. It accounts for up to 40% of the plant's extract. As of 2018, preliminary clinical studies on CBD included research for the treatment of anxiety, movement disorders, and pain. CBD is available and consumed in multiple forms, including inhalation of smoke or vapor, as an aerosol spray, and by mouth. It may be supplied as an oil containing CBD, capsules, dried cannabis, or as a liquid solution. CBD is thought not to be as psychoactive as THC (delta-9-tetrahydrocannabinol - the chemical in marijuana responsible for the "HIGH"). Studies suggest that CBD may interact with different biological target receptors in the body, including cannabinoid and other neurotransmitter receptors. As of 2018 the mechanism of action for its biological effects has not been determined.  Side-effects  Adverse reactions: Dry mouth, diarrhea, decreased appetite, fatigue, drowsiness, malaise, weakness, sleep disturbances, and others.  Drug interactions: CBC may interact with other medications  such as blood-thinners. (Last update: 09/29/2019) ____________________________________________________________________________________________   ____________________________________________________________________________________________  Medication Rules  Purpose: To inform patients, and their family members, of our rules and regulations.  Applies to: All patients receiving prescriptions (written or electronic).  Pharmacy of record: Pharmacy where electronic prescriptions will be sent. If written prescriptions are taken to a different pharmacy, please inform   the nursing staff. The pharmacy listed in the electronic medical record should be the one where you would like electronic prescriptions to be sent.  Electronic prescriptions: In compliance with the [place] Strengthen Opioid Misuse Prevention (STOP) Act of 2017 (Session Law 2017-74/H243), effective February 22, 2018, all controlled substances must be electronically prescribed. Calling prescriptions to the pharmacy will cease to exist.  Prescription refills: Only during scheduled appointments. Applies to all prescriptions.  NOTE: The following applies primarily to controlled substances (Opioid* Pain Medications).   Type of encounter (visit): For patients receiving controlled substances, face-to-face visits are required. (Not an option or up to the patient.)  Patient's responsibilities: 1. Pain Pills: Bring all pain pills to every appointment (except for procedure appointments). 2. Pill Bottles: Bring pills in original pharmacy bottle. Always bring the newest bottle. Bring bottle, even if empty. 3. Medication refills: You are responsible for knowing and keeping track of what medications you take and those you need refilled. The day before your appointment: write a list of all prescriptions that need to be refilled. The day of the appointment: give the list to the admitting nurse. Prescriptions will be written only during  appointments. No prescriptions will be written on procedure days. If you forget a medication: it will not be "Called in", "Faxed", or "electronically sent". You will need to get another appointment to get these prescribed. No early refills. Do not call asking to have your prescription filled early. 4. Prescription Accuracy: You are responsible for carefully inspecting your prescriptions before leaving our office. Have the discharge nurse carefully go over each prescription with you, before taking them home. Make sure that your name is accurately spelled, that your address is correct. Check the name and dose of your medication to make sure it is accurate. Check the number of pills, and the written instructions to make sure they are clear and accurate. Make sure that you are given enough medication to last until your next medication refill appointment. 5. Taking Medication: Take medication as prescribed. When it comes to controlled substances, taking less pills or less frequently than prescribed is permitted and encouraged. Never take more pills than instructed. Never take medication more frequently than prescribed.  6. Inform other Doctors: Always inform, all of your healthcare providers, of all the medications you take. 7. Pain Medication from other Providers: You are not allowed to accept any additional pain medication from any other Doctor or Healthcare provider. There are two exceptions to this rule. (see below) In the event that you require additional pain medication, you are responsible for notifying us, as stated below. 8. Medication Agreement: You are responsible for carefully reading and following our Medication Agreement. This must be signed before receiving any prescriptions from our practice. Safely store a copy of your signed Agreement. Violations to the Agreement will result in no further prescriptions. (Additional copies of our Medication Agreement are available upon request.) 9. Laws, Rules,  & Regulations: All patients are expected to follow all Federal and State Laws, Statutes, Rules, & Regulations. Ignorance of the Laws does not constitute a valid excuse.  10. Illegal drugs and Controlled Substances: The use of illegal substances (including, but not limited to marijuana and its derivatives) and/or the illegal use of any controlled substances is strictly prohibited. Violation of this rule may result in the immediate and permanent discontinuation of any and all prescriptions being written by our practice. The use of any illegal substances is prohibited. 11. Adopted CDC guidelines & recommendations: Target dosing   levels will be at or below 60 MME/day. Use of benzodiazepines** is not recommended.  Exceptions: There are only two exceptions to the rule of not receiving pain medications from other Healthcare Providers. 1. Exception #1 (Emergencies): In the event of an emergency (i.e.: accident requiring emergency care), you are allowed to receive additional pain medication. However, you are responsible for: As soon as you are able, call our office [phone], at any time of the day or night, and leave a message stating your name, the date and nature of the emergency, and the name and dose of the medication prescribed. In the event that your call is answered by a member of our staff, make sure to document and save the date, time, and the name of the person that took your information.  2. Exception #2 (Planned Surgery): In the event that you are scheduled by another doctor or dentist to have any type of surgery or procedure, you are allowed (for a period no longer than 30 days), to receive additional pain medication, for the acute post-op pain. However, in this case, you are responsible for picking up a copy of our "Post-op Pain Management for Surgeons" handout, and giving it to your surgeon or dentist. This document is available at our office, and does not require an appointment to obtain it. Simply  go to our office during business hours (Monday-Thursday from 8:00 AM to 4:00 PM) (Friday 8:00 AM to 12:00 Noon) or if you have a scheduled appointment with us, prior to your surgery, and ask for it by name. In addition, you are responsible for: calling our office [phone], at any time of the day or night, and leaving a message stating your name, name of your surgeon, type of surgery, and date of procedure or surgery. Failure to comply with your responsibilities may result in termination of therapy involving the controlled substances.  *Opioid medications include: morphine, codeine, oxycodone, oxymorphone, hydrocodone, hydromorphone, meperidine, tramadol, tapentadol, buprenorphine, fentanyl, methadone. **Benzodiazepine medications include: diazepam (Valium), alprazolam (Xanax), clonazepam (Klonopine), lorazepam (Ativan), clorazepate (Tranxene), chlordiazepoxide (Librium), estazolam (Prosom), oxazepam (Serax), temazepam (Restoril), triazolam (Halcion) (Last updated: 10/30/2019) ____________________________________________________________________________________________   ____________________________________________________________________________________________  Medication Recommendations and Reminders  Applies to: All patients receiving prescriptions (written and/or electronic).  Medication Rules & Regulations: These rules and regulations exist for your safety and that of others. They are not flexible and neither are we. Dismissing or ignoring them will be considered "non-compliance" with medication therapy, resulting in complete and irreversible termination of such therapy. (See document titled "Medication Rules" for more details.) In all conscience, because of safety reasons, we cannot continue providing a therapy where the patient does not follow instructions.  Pharmacy of record:   Definition: This is the pharmacy where your electronic prescriptions will be sent.   We do not endorse any  particular pharmacy, however, we have experienced problems with [facility] not securing enough medication supply for the community.  We do not restrict you in your choice of pharmacy. However, once we write for your prescriptions, we will NOT be re-sending more prescriptions to fix restricted supply problems created by your pharmacy, or your insurance.   The pharmacy listed in the electronic medical record should be the one where you want electronic prescriptions to be sent.  If you choose to change pharmacy, simply notify our nursing staff.  Recommendations:  Keep all of your pain medications in a safe place, under lock and key, even if you live alone. We will NOT replace lost, stolen, or   damaged medication.  After you fill your prescription, take 1 week's worth of pills and put them away in a safe place. You should keep a separate, properly labeled bottle for this purpose. The remainder should be kept in the original bottle. Use this as your primary supply, until it runs out. Once it's gone, then you know that you have 1 week's worth of medicine, and it is time to come in for a prescription refill. If you do this correctly, it is unlikely that you will ever run out of medicine.  To make sure that the above recommendation works, it is very important that you make sure your medication refill appointments are scheduled at least 1 week before you run out of medicine. To do this in an effective manner, make sure that you do not leave the office without scheduling your next medication management appointment. Always ask the nursing staff to show you in your prescription , when your medication will be running out. Then arrange for the receptionist to get you a return appointment, at least 7 days before you run out of medicine. Do not wait until you have 1 or 2 pills left, to come in. This is very poor planning and does not take into consideration that we may need to cancel appointments due to bad weather,  sickness, or emergencies affecting our staff.  DO NOT ACCEPT A "Partial Fill": If for any reason your pharmacy does not have enough pills/tablets to completely fill or refill your prescription, do not allow for a "partial fill". The law allows the pharmacy to complete that prescription within 72 hours, without requiring a new prescription. If they do not fill the rest of your prescription within those 72 hours, you will need a separate prescription to fill the remaining amount, which we will NOT provide. If the reason for the partial fill is your insurance, you will need to talk to the pharmacist about payment alternatives for the remaining tablets, but again, DO NOT ACCEPT A PARTIAL FILL, unless you can trust your pharmacist to obtain the remainder of the pills within 72 hours.  Prescription refills and/or changes in medication(s):   Prescription refills, and/or changes in dose or medication, will be conducted only during scheduled medication management appointments. (Applies to both, written and electronic prescriptions.)  No refills on procedure days. No medication will be changed or started on procedure days. No changes, adjustments, and/or refills will be conducted on a procedure day. Doing so will interfere with the diagnostic portion of the procedure.  No phone refills. No medications will be "called into the pharmacy".  No Fax refills.  No weekend refills.  No Holliday refills.  No after hours refills.  Remember:  Business hours are:  Monday to Thursday 8:00 AM to 4:00 PM Provider's Schedule: Cortnee Steinmiller, MD - Appointments are:  Medication management: Monday and Wednesday 8:00 AM to 4:00 PM Procedure day: Tuesday and Thursday 7:30 AM to 4:00 PM Bilal Lateef, MD - Appointments are:  Medication management: Tuesday and Thursday 8:00 AM to 4:00 PM Procedure day: Monday and Wednesday 7:30 AM to 4:00 PM (Last update:  09/12/2019) ____________________________________________________________________________________________    

## 2019-12-24 NOTE — Progress Notes (Signed)
PROVIDER NOTE: Information contained herein reflects review and annotations entered in association with encounter. Interpretation of such information and data should be left to medically-trained personnel. Information provided to patient can be located elsewhere in the medical record under "Patient Instructions". Document created using STT-dictation technology, any transcriptional errors that may result from process are unintentional.    Patient: Debra Myers  Service Category: E/M  Provider: Gaspar Cola, MD  DOB: [birth year]  DOS: 12/24/2019  Specialty: Interventional Pain Management  MRN: [id number]  Setting: Ambulatory outpatient  PCP: Pleas Koch, NP  Type: Established Patient    Referring Provider: Pleas Koch, NP  Location: Office  Delivery: Face-to-face     HPI  [name], a 54 y.o. year old female, is here today because of her Chronic pain syndrome [G89.4]. Debra Myers primary complain today is Back Pain Last encounter: My last encounter with her was on 11/07/2019. Pertinent problems: Debra Myers has Numbness of upper extremity; Radiculitis involving upper extremity; Chronic low back pain (Primary Area of Pain) (Bilateral) (L>R); Lumbar spondylosis (Bulging Disc & Severe Left Foraminal Stenosis at L3-4); Chronic lower extremity pain (Secondary area of Pain) (Left); Lumbar foraminal stenosis (Severe Left L3-4); Lumbar facet syndrome (Bilateral) (L>R); Cervical spondylosis (C5-6 & C6-7 DDD); Cervical (3 mm) Grade 1 Anterolisthesis of C4 over C5; Chronic neck pain (Third area of Pain) (Bilateral) (L>R); Chronic cervical radicular pain (Bilateral) (L>R); Cervical foraminal stenosis (multilevel) (Bilateral); Muscle spasm, nocturnal; Musculoskeletal pain; Carpal tunnel syndrome (Bilateral) (L>R); Cervical facet syndrome (Bilateral) (L>R); Chronic pain syndrome; DDD (degenerative disc disease), lumbar; DDD (degenerative disc disease), cervical; Pain in right knee; Chronic pain of  left knee; Osteoarthritis involving multiple joints; and Acute exacerbation of chronic low back pain on their pertinent problem list. Pain Assessment: Severity of Chronic pain is reported as a 3 /10. Location: Finger (Comment which one) Left, Right/pain radiaties donwn legs to her foot. Onset: More than a month ago. Quality: Aching, Sore. Timing: Constant. Modifying factor(s): ice, resting, meds. Vitals:  height is '5\' 4"'  (1.626 m) and weight is 235 lb (106.6 kg). Her temperature is 96.8 F (36 C) (abnormal). Her blood pressure is 137/78 and her pulse is 90. Her oxygen saturation is 99%.   Reason for encounter: medication management.  The patient indicates doing well with the current medication regimen. No adverse reactions or side effects reported to the medications.  In addition, the patient contacted [name] to see if we could do something about an acute flareup of her low back pain.  We have offered a Toradol/Norflex IM injection 60/60 mg.  Today she indicates having some pain in the right lower back which she calls the "hip".  Provocative physical exam today was positive for bilateral SI joint arthralgia, with the right being worse than the left.  The Patrick maneuver was positive bilaterally for SI joint pain and negative for any hip discomfort, also bilaterally.  Today I have offered her to do a diagnostic right-sided SI joint block under fluoroscopic guidance and IV sedation, but as usual she has declined. The patient has been coming to our clinic since 12/02/2014 and has never taken advantage of any of our interventional therapies.  RTCB: 05/07/2020.  Pharmacotherapy Assessment   Analgesic: Hydrocodone/APAP 5/325, 2 tab PO QD (10 mg/day of hydrocodone) MME/day:10 mg/day.   Monitoring: [place] PMP: PDMP reviewed during this encounter.       Pharmacotherapy: No side-effects or adverse reactions reported. Compliance: No problems identified. Effectiveness: Clinically acceptable.  Chauncey Fischer, RN   12/24/2019 11:05 AM  Sign when Signing Visit Nursing Pain Medication Assessment:  Safety precautions to be maintained throughout the outpatient stay will include: orient to surroundings, keep bed in low position, maintain call bell within reach at all times, provide assistance with transfer out of bed and ambulation.  Medication Inspection Compliance: Debra Myers did not comply with our request to bring her pills to be counted. She was reminded that bringing the medication bottles, even when empty, is a requirement.  Medication: None brought in. Pill/Patch Count: None available to be counted. Bottle Appearance: No container available. Did not bring bottle(s) to appointment. Filled Date: N/A Last Medication intake:  TodaySafety precautions to be maintained throughout the outpatient stay will include: orient to surroundings, keep bed in low position, maintain call bell within reach at all times, provide assistance with transfer out of bed and ambulation.     UDS:  Summary  Date Value Ref Range Status  11/07/2019 Note  Final    Comment:    ==================================================================== [employer] (MW) ==================================================================== Test                             Result       Flag       Units  Drug Present and Declared for Prescription Verification   Amphetamine                    1429         EXPECTED   ng/mg creat    Amphetamine is available as a schedule II prescription drug.    Lorazepam                      261          EXPECTED   ng/mg creat    Source of lorazepam is a scheduled prescription medication.    Hydrocodone                    873          EXPECTED   ng/mg creat   Dihydrocodeine                 116          EXPECTED   ng/mg creat   Norhydrocodone                 1331         EXPECTED   ng/mg creat    Sources of hydrocodone include scheduled prescription medications.    Dihydrocodeine and norhydrocodone are  expected metabolites of    hydrocodone. Dihydrocodeine is also available as a scheduled    prescription medication.  ==================================================================== Test                      Result    Flag   Units      Ref Range   Creatinine              89               mg/dL      >=20 ==================================================================== Declared Medications:  The flagging and interpretation on this report are based on the  following declared medications.  Unexpected results may arise from  inaccuracies in the declared medications.   **Note: The testing scope of this panel includes these medications:  Amphetamine (Vyvanse)  Hydrocodone (Norco)  Lorazepam (Ativan)   **Note: The testing scope of this panel does not include the  following reported medications:   Acetaminophen (Norco)  Albuterol (Ventolin HFA)  Aspirin  Atorvastatin (Lipitor)  Bupropion (Wellbutrin XL)  Cetirizine (Zyrtec)  Fluticasone (Flonase)  Ibuprofen (Advil)  Lamotrigine (Lamictal)  Metaxalone (Skelaxin)  Metformin (Glucophage)  Montelukast (Singulair)  Paroxetine  Spironolactone (Aldactone) ==================================================================== For clinical consultation, please call [phone]. ====================================================================      ROS  Constitutional: Denies any fever or chills Gastrointestinal: No reported hemesis, hematochezia, vomiting, or acute GI distress Musculoskeletal: Denies any acute onset joint swelling, redness, loss of ROM, or weakness Neurological: No reported episodes of acute onset apraxia, aphasia, dysarthria, agnosia, amnesia, paralysis, loss of coordination, or loss of consciousness  Medication Review  HYDROcodone-acetaminophen, LORazepam, PARoxetine, albuterol, aspirin, atorvastatin, buPROPion, cetirizine, fluticasone, ibuprofen, lamoTRIgine, lisdexamfetamine, metFORMIN, metaxalone,  montelukast, and spironolactone  History Review  Allergy: Debra Myers is allergic to oxycodone, penicillins, red dye, and sulfa antibiotics. Drug: Debra Myers  reports no history of drug use. Alcohol:  reports no history of alcohol use. Tobacco:  reports that she has never smoked. She has never used smokeless tobacco. Social: Debra Myers  reports that she has never smoked. She has never used smokeless tobacco. She reports that she does not drink alcohol and does not use drugs. Medical:  has a past medical history of Anxiety, Asthma, Bulging lumbar disc (L3-4) (02/26/2015), Degenerative disc disease, Degenerative lumbar disc, Depression, Diabetes mellitus, Elective abortion, GERD (gastroesophageal reflux disease), Hypertension, Stroke ([place]) (06/11/2015), and Vaginal delivery. Surgical: Debra Myers  has a past surgical history that includes Cholecystectomy (2001); Lapband (04/2012); Wisdom tooth extraction; Dilatation & curettage/hysteroscopy with myosure (N/A, 09/10/2014); mirena; and lap band surgery. Family: family history includes Asthma in her maternal grandmother; Diabetes in her father; Hypertension in her father and mother; Stroke in her maternal grandfather and maternal grandmother.  Laboratory Chemistry Profile   Renal Lab Results  Component Value Date   BUN 15 11/23/2019   CREATININE 0.92 22/29/7989   BCR NOT APPLICABLE 21/19/4174   GFR 58.78 (L) 02/08/2019   GFRAA >60 12/16/2017   GFRNONAA >60 12/16/2017     Hepatic Lab Results  Component Value Date   AST 13 11/23/2019   ALT 14 11/23/2019   ALBUMIN 4.4 02/08/2019   ALKPHOS 108 02/08/2019   HCVAB NEGATIVE 05/18/2013     Electrolytes Lab Results  Component Value Date   NA 140 11/23/2019   K 4.8 11/23/2019   CL 105 11/23/2019   CALCIUM 9.3 11/23/2019   PHOS 4.0 10/31/2015     Bone Lab Results  Component Value Date   VD25OH 31.95 02/08/2019     Inflammation (CRP: Acute Phase) (ESR: Chronic Phase) Lab Results  Component  Value Date   ESRSEDRATE 32 05/26/2017       Note: Above Lab results reviewed.  Recent Imaging Review  VAS [name] LOWER EXTREMITY VENOUS (DVT) ([facility] and [facility] 7a-7p)  Lower Venous Study  Indications: Pain.   Performing Technologist: Maudry Mayhew MHA, RDMS, RVT, RDCS    Examination Guidelines: A complete evaluation includes B-mode imaging, spectral Doppler, color Doppler, and power Doppler as needed of all accessible portions of each vessel. Bilateral testing is considered an integral part of a complete examination. Limited examinations for reoccurring indications may be performed as noted.    Right Venous Findings: +---+---------------+---------+-----------+----------+-------+    CompressibilityPhasicitySpontaneityPropertiesSummary +---+---------------+---------+-----------+----------+-------+ CFVFull           Yes  Yes                          +---+---------------+---------+-----------+----------+-------+     Left Venous Findings: +---------+---------------+---------+-----------+----------+-------+          CompressibilityPhasicitySpontaneityPropertiesSummary +---------+---------------+---------+-----------+----------+-------+ CFV      Full           Yes      Yes                          +---------+---------------+---------+-----------+----------+-------+ SFJ      Full                                                 +---------+---------------+---------+-----------+----------+-------+ FV Prox  Full                                                 +---------+---------------+---------+-----------+----------+-------+ FV Mid   Full                                                 +---------+---------------+---------+-----------+----------+-------+ FV DistalFull                                                 +---------+---------------+---------+-----------+----------+-------+ PFV      Full                                                  +---------+---------------+---------+-----------+----------+-------+ POP      Full           Yes      Yes                          +---------+---------------+---------+-----------+----------+-------+ PTV      Full                                                 +---------+---------------+---------+-----------+----------+-------+ PERO     Full                                                 +---------+---------------+---------+-----------+----------+-------+          Summary: Right: No evidence of common femoral vein obstruction. Left: There is no evidence of deep vein thrombosis in the lower extremity. No cystic structure found in the popliteal fossa.   *See table(s) above for measurements and observations.  Electronically signed by Monica Martinez MD on 12/16/2017 at 63:04:46 PM.      Final   Note: Reviewed  Physical Exam  General appearance: Well nourished, well developed, and well hydrated. In no apparent acute distress Mental status: Alert, oriented x 3 (person, place, & time)       Respiratory: No evidence of acute respiratory distress Eyes: PERLA Vitals: BP 137/78   Pulse 90   Temp (!) 96.8 F (36 C)   Ht '5\' 4"'  (1.626 m)   Wt 235 lb (106.6 kg)   SpO2 99%   BMI 40.34 kg/m  BMI: Estimated body mass index is 40.34 kg/m as calculated from the following:   Height as of this encounter: '5\' 4"'  (1.626 m).   Weight as of this encounter: 235 lb (106.6 kg). Ideal: Ideal body weight: 54.7 kg (120 lb 9.5 oz) Adjusted ideal body weight: 75.5 kg (166 lb 5.7 oz)  Assessment   Status Diagnosis  Controlled Controlled Controlled 1. Chronic pain syndrome   2. Chronic low back pain (Primary Area of Pain) (Bilateral) (L>R)   3. Chronic lower extremity pain (Secondary area of Pain) (Left)   4. Chronic neck pain (Third area of Pain) (Bilateral) (L>R)   5. Acute exacerbation of chronic low back pain   6. Muscle spasm, nocturnal    7. Musculoskeletal pain      Updated Problems: Problem  Acute Exacerbation of Chronic Low Back Pain    Plan of Care  Problem-specific:  No problem-specific Assessment & Plan notes found for this encounter.  Debra Myers has a current medication list which includes the following long-term medication(s): albuterol, atorvastatin, fluticasone, [START ON 01/08/2020] hydrocodone-acetaminophen, [START ON 02/07/2020] hydrocodone-acetaminophen, [START ON 03/08/2020] hydrocodone-acetaminophen, [START ON 04/07/2020] hydrocodone-acetaminophen, [START ON 02/07/2020] metaxalone, metformin, montelukast, and spironolactone.  Pharmacotherapy (Medications Ordered): Meds ordered this encounter  Medications  . ketorolac (TORADOL) injection 60 mg  . orphenadrine (NORFLEX) injection 60 mg  . metaxalone (SKELAXIN) 800 MG tablet    Sig: Take 1 tablet (800 mg total) by mouth 3 (three) times daily. Max: 3/day    Dispense:  90 tablet    Refill:  2    Fill one day early if pharmacy is closed on scheduled refill date. May substitute for generic if available.  Marland Kitchen HYDROcodone-acetaminophen (NORCO/VICODIN) 5-325 MG tablet    Sig: Take 0.5-1 tablets by mouth 2 (two) times daily as needed for severe pain. Must last 30 days    Dispense:  60 tablet    Refill:  0    Chronic Pain: STOP Act (Not applicable) Fill 1 day early if closed on refill date. Avoid benzodiazepines within 8 hours of opioids  . HYDROcodone-acetaminophen (NORCO/VICODIN) 5-325 MG tablet    Sig: Take 0.5-1 tablets by mouth 2 (two) times daily as needed for severe pain. Must last 30 days    Dispense:  60 tablet    Refill:  0    Chronic Pain: STOP Act (Not applicable) Fill 1 day early if closed on refill date. Avoid benzodiazepines within 8 hours of opioids  . HYDROcodone-acetaminophen (NORCO/VICODIN) 5-325 MG tablet    Sig: Take 0.5-1 tablets by mouth 2 (two) times daily as needed for severe pain. Must last 30 days    Dispense:  60 tablet     Refill:  0    Chronic Pain: STOP Act (Not applicable) Fill 1 day early if closed on refill date. Avoid benzodiazepines within 8 hours of opioids   Orders:  No orders of the defined types were placed in this encounter.  Follow-up plan:   Return in about 4 months (around 05/07/2020) for (  F2F), (Med Mgmt).      Interventional management options: Planned, scheduled, and/or pending:      Considering:   Diagnostic bilateral lumbar facet block  Possible bilateral lumbar facet RFA.  Diagnostic left L3-4 LESI  Diagnostic left L3 TFESI  Diagnostic left L4 TFESI  Diagnostic bilateral L5 TFESI  Diagnostic left CESI  Diagnostic bilateral cervical facet block  Possible bilateral cervical facet RFA.    Palliative PRN treatment(s):   None at this time    Recent Visits Date Type Provider Dept  11/07/19 Office Visit Milinda Pointer, MD [facility]  Showing recent visits within past 90 days and meeting all other requirements Today's Visits Date Type Provider Dept  12/24/19 Procedure visit Milinda Pointer, MD [facility]  Showing today's visits and meeting all other requirements Future Appointments Date Type Provider Dept  02/06/20 Appointment Milinda Pointer, MD [facility]  Showing future appointments within next 90 days and meeting all other requirements  I discussed the assessment and treatment plan with the patient. The patient was provided an opportunity to ask questions and all were answered. The patient agreed with the plan and demonstrated an understanding of the instructions.  Patient advised to call back or seek an in-person evaluation if the symptoms or condition worsens.  Duration of encounter: 30 minutes.  Note by: Gaspar Cola, MD Date: 12/24/2019; Time: 11:12 AM

## 2019-12-24 NOTE — Progress Notes (Signed)
Nursing Pain Medication Assessment:  Safety precautions to be maintained throughout the outpatient stay will include: orient to surroundings, keep bed in low position, maintain call bell within reach at all times, provide assistance with transfer out of bed and ambulation.  Medication Inspection Compliance: Ms. Blasdel did not comply with our request to bring her pills to be counted. She was reminded that bringing the medication bottles, even when empty, is a requirement.  Medication: None brought in. Pill/Patch Count: None available to be counted. Bottle Appearance: No container available. Did not bring bottle(s) to appointment. Filled Date: N/A Last Medication intake:  TodaySafety precautions to be maintained throughout the outpatient stay will include: orient to surroundings, keep bed in low position, maintain call bell within reach at all times, provide assistance with transfer out of bed and ambulation.

## 2019-12-26 ENCOUNTER — Other Ambulatory Visit: Payer: Self-pay | Admitting: Primary Care

## 2019-12-26 DIAGNOSIS — J309 Allergic rhinitis, unspecified: Secondary | ICD-10-CM

## 2020-01-08 ENCOUNTER — Other Ambulatory Visit: Payer: Self-pay | Admitting: Primary Care

## 2020-02-03 NOTE — Progress Notes (Deleted)
PROVIDER NOTE: Information contained herein reflects review and annotations entered in association with encounter. Interpretation of such information and data should be left to medically-trained personnel. Information provided to patient can be located elsewhere in the medical record under "Patient Instructions". Document created using STT-dictation technology, any transcriptional errors that may result from process are unintentional.    Patient: Debra Myers  Service Category: E/M  Provider: Gaspar Cola, MD  DOB: [birth year]  DOS: 02/06/2020  Specialty: Interventional Pain Management  MRN: [id number]  Setting: Ambulatory outpatient  PCP: Pleas Koch, NP  Type: Established Patient    Referring Provider: Elby Beck, FNP  Location: Office  Delivery: Face-to-face     HPI  Debra Myers, a 54 y.o. year old female, is here today because of her No primary diagnosis found.. Debra Myers's primary complain today is No chief complaint on file. Last encounter: My last encounter with her was on 12/24/2019. Pertinent problems: Debra Myers has Numbness of upper extremity; Radiculitis involving upper extremity; Chronic low back pain (Primary Area of Pain) (Bilateral) (L>R); Lumbar spondylosis (Bulging Disc & Severe Left Foraminal Stenosis at L3-4); Chronic lower extremity pain (Secondary area of Pain) (Left); Lumbar foraminal stenosis (Severe Left L3-4); Lumbar facet syndrome (Bilateral) (L>R); Cervical spondylosis (C5-6 & C6-7 DDD); Cervical (3 mm) Grade 1 Anterolisthesis of C4 over C5; Chronic neck pain (Third area of Pain) (Bilateral) (L>R); Chronic cervical radicular pain (Bilateral) (L>R); Cervical foraminal stenosis (multilevel) (Bilateral); Muscle spasm, nocturnal; Musculoskeletal pain; Carpal tunnel syndrome (Bilateral) (L>R); Cervical facet syndrome (Bilateral) (L>R); Chronic pain syndrome; DDD (degenerative disc disease), lumbar; DDD (degenerative disc disease), cervical; Pain in right  knee; Chronic pain of left knee; Osteoarthritis involving multiple joints; and Acute exacerbation of chronic low back pain on their pertinent problem list. Pain Assessment: Severity of   is reported as a  /10. Location:    / . Onset:  . Quality:  . Timing:  . Modifying factor(s):  Marland Kitchen Vitals:  vitals were not taken for this visit.   Reason for encounter: medication management. ***  Post-Procedure Evaluation  Procedure (12/24/2019): {There is no content from the last Procedure section.} *** Pre-procedure pain level: ***/10 Post-procedure: ***/10          Sedation: Please see nurses note.  Effectiveness during initial hour after procedure(Ultra-Short Term Relief):   ***.  Local anesthetic used: Long-acting (4-6 hours) Effectiveness: Defined as any analgesic benefit obtained secondary to the administration of local anesthetics. This carries significant diagnostic value as to the etiological location, or anatomical origin, of the pain. Duration of benefit is expected to coincide with the duration of the local anesthetic used.  Effectiveness during initial 4-6 hours after procedure(Short-Term Relief):   ***.  Long-term benefit: Defined as any relief past the pharmacologic duration of the local anesthetics.  Effectiveness past the initial 6 hours after procedure(Long-Term Relief):   ***.  Current benefits: Defined as benefit that persist at this time.   Analgesia:   ***  Function:  ***  ROM:  ***   Pharmacotherapy Assessment   Analgesic: Hydrocodone/APAP 5/325, 2 tab PO QD (10 mg/day of hydrocodone) MME/day:10 mg/day.   Monitoring: [place] PMP: PDMP reviewed during this encounter.       Pharmacotherapy: No side-effects or adverse reactions reported. Compliance: No problems identified. Effectiveness: Clinically acceptable.  No notes on file  UDS:  Summary  Date Value Ref Range Status  11/07/2019 Note  Final    Comment:     ==================================================================== [facility]  Select 13 (MW) ==================================================================== Test                             Result       Flag       Units  Drug Present and Declared for Prescription Verification   Amphetamine                    1429         EXPECTED   ng/mg creat    Amphetamine is available as a schedule II prescription drug.    Lorazepam                      261          EXPECTED   ng/mg creat    Source of lorazepam is a scheduled prescription medication.    Hydrocodone                    873          EXPECTED   ng/mg creat   Dihydrocodeine                 116          EXPECTED   ng/mg creat   Norhydrocodone                 1331         EXPECTED   ng/mg creat    Sources of hydrocodone include scheduled prescription medications.    Dihydrocodeine and norhydrocodone are expected metabolites of    hydrocodone. Dihydrocodeine is also available as a scheduled    prescription medication.  ==================================================================== Test                      Result    Flag   Units      Ref Range   Creatinine              89               mg/dL      >=20 ==================================================================== Declared Medications:  The flagging and interpretation on this report are based on the  following declared medications.  Unexpected results may arise from  inaccuracies in the declared medications.   **Note: The testing scope of this panel includes these medications:   Amphetamine (Vyvanse)  Hydrocodone (Norco)  Lorazepam (Ativan)   **Note: The testing scope of this panel does not include the  following reported medications:   Acetaminophen (Norco)  Albuterol (Ventolin HFA)  Aspirin  Atorvastatin (Lipitor)  Bupropion (Wellbutrin XL)  Cetirizine (Zyrtec)  Fluticasone (Flonase)  Ibuprofen (Advil)  Lamotrigine (Lamictal)  Metaxalone (Skelaxin)   Metformin (Glucophage)  Montelukast (Singulair)  Paroxetine  Spironolactone (Aldactone) ==================================================================== For clinical consultation, please call [phone]. ====================================================================      ROS  Constitutional: Denies any fever or chills Gastrointestinal: No reported hemesis, hematochezia, vomiting, or acute GI distress Musculoskeletal: Denies any acute onset joint swelling, redness, loss of ROM, or weakness Neurological: No reported episodes of acute onset apraxia, aphasia, dysarthria, agnosia, amnesia, paralysis, loss of coordination, or loss of consciousness  Medication Review  HYDROcodone-acetaminophen, LORazepam, PARoxetine, albuterol, aspirin, atorvastatin, buPROPion, cetirizine, fluticasone, ibuprofen, lamoTRIgine, lisdexamfetamine, metFORMIN, metaxalone, montelukast, and spironolactone  History Review  Allergy: Debra Myers is allergic to oxycodone, penicillins, red dye, and sulfa antibiotics. Drug: Debra Myers  reports no history of drug use.  Alcohol:  reports no history of alcohol use. Tobacco:  reports that she has never smoked. She has never used smokeless tobacco. Social: Debra Myers  reports that she has never smoked. She has never used smokeless tobacco. She reports that she does not drink alcohol and does not use drugs. Medical:  has a past medical history of Anxiety, Asthma, Bulging lumbar disc (L3-4) (02/26/2015), Degenerative disc disease, Degenerative lumbar disc, Depression, Diabetes mellitus, Elective abortion, GERD (gastroesophageal reflux disease), Hypertension, Stroke ([place]) (06/11/2015), and Vaginal delivery. Surgical: Debra Myers  has a past surgical history that includes Cholecystectomy (2001); Lapband (04/2012); Wisdom tooth extraction; Dilatation & curettage/hysteroscopy with myosure (N/A, 09/10/2014); mirena; and lap band surgery. Family: family history includes Asthma in her  maternal grandmother; Diabetes in her father; Hypertension in her father and mother; Stroke in her maternal grandfather and maternal grandmother.  Laboratory Chemistry Profile   Renal Lab Results  Component Value Date   BUN 15 11/23/2019   CREATININE 0.92 78/29/5621   BCR NOT APPLICABLE 30/86/5784   GFR 58.78 (L) 02/08/2019   GFRAA >60 12/16/2017   GFRNONAA >60 12/16/2017     Hepatic Lab Results  Component Value Date   AST 13 11/23/2019   ALT 14 11/23/2019   ALBUMIN 4.4 02/08/2019   ALKPHOS 108 02/08/2019   HCVAB NEGATIVE 05/18/2013     Electrolytes Lab Results  Component Value Date   NA 140 11/23/2019   K 4.8 11/23/2019   CL 105 11/23/2019   CALCIUM 9.3 11/23/2019   PHOS 4.0 10/31/2015     Bone Lab Results  Component Value Date   VD25OH 31.95 02/08/2019     Inflammation (CRP: Acute Phase) (ESR: Chronic Phase) Lab Results  Component Value Date   ESRSEDRATE 32 05/26/2017       Note: Above Lab results reviewed.  Recent Imaging Review  VAS [name] LOWER EXTREMITY VENOUS (DVT) ([facility] and [facility] 7a-7p)  Lower Venous Study  Indications: Pain.   Performing Technologist: Maudry Mayhew MHA, RDMS, RVT, RDCS    Examination Guidelines: A complete evaluation includes B-mode imaging, spectral Doppler, color Doppler, and power Doppler as needed of all accessible portions of each vessel. Bilateral testing is considered an integral part of a complete examination. Limited examinations for reoccurring indications may be performed as noted.    Right Venous Findings: +---+---------------+---------+-----------+----------+-------+    CompressibilityPhasicitySpontaneityPropertiesSummary +---+---------------+---------+-----------+----------+-------+ CFVFull           Yes      Yes                          +---+---------------+---------+-----------+----------+-------+     Left Venous Findings: +---------+---------------+---------+-----------+----------+-------+           CompressibilityPhasicitySpontaneityPropertiesSummary +---------+---------------+---------+-----------+----------+-------+ CFV      Full           Yes      Yes                          +---------+---------------+---------+-----------+----------+-------+ SFJ      Full                                                 +---------+---------------+---------+-----------+----------+-------+ FV Prox  Full                                                 +---------+---------------+---------+-----------+----------+-------+  FV Mid   Full                                                 +---------+---------------+---------+-----------+----------+-------+ FV DistalFull                                                 +---------+---------------+---------+-----------+----------+-------+ PFV      Full                                                 +---------+---------------+---------+-----------+----------+-------+ POP      Full           Yes      Yes                          +---------+---------------+---------+-----------+----------+-------+ PTV      Full                                                 +---------+---------------+---------+-----------+----------+-------+ PERO     Full                                                 +---------+---------------+---------+-----------+----------+-------+          Summary: Right: No evidence of common femoral vein obstruction. Left: There is no evidence of deep vein thrombosis in the lower extremity. No cystic structure found in the popliteal fossa.   *See table(s) above for measurements and observations.  Electronically signed by Monica Martinez MD on 12/16/2017 at 6:04:46 PM.      Final   Note: Reviewed        Physical Exam  General appearance: Well nourished, well developed, and well hydrated. In no apparent acute distress Mental status: Alert, oriented x 3 (person,  place, & time)       Respiratory: No evidence of acute respiratory distress Eyes: PERLA Vitals: There were no vitals taken for this visit. BMI: Estimated body mass index is 40.34 kg/m as calculated from the following:   Height as of 12/24/19: _0  (1.626 m).   Weight as of 12/24/19: 235 lb (106.6 kg). Ideal: Patient weight not recorded  Assessment   Status Diagnosis  Controlled Controlled Controlled No diagnosis found.   Updated Problems: No problems updated.  Plan of Care  Problem-specific:  No problem-specific Assessment & Plan notes found for this encounter.  Debra Myers has a current medication list which includes the following long-term medication(s): albuterol, atorvastatin, fluticasone, hydrocodone-acetaminophen, [START ON 02/07/2020] hydrocodone-acetaminophen, [START ON 03/08/2020] hydrocodone-acetaminophen, [START ON 04/07/2020] hydrocodone-acetaminophen, [START ON 02/07/2020] metaxalone, metformin, montelukast, and spironolactone.  Pharmacotherapy (Medications Ordered): No orders of the defined types were placed in this encounter.  Orders:  No orders of the defined types were placed in this encounter.  Follow-up plan:  No follow-ups on file.      Interventional management options: Planned, scheduled, and/or pending:      Considering:   Diagnostic bilateral lumbar facet block  Possible bilateral lumbar facet RFA.  Diagnostic left L3-4 LESI  Diagnostic left L3 TFESI  Diagnostic left L4 TFESI  Diagnostic bilateral L5 TFESI  Diagnostic left CESI  Diagnostic bilateral cervical facet block  Possible bilateral cervical facet RFA.    Palliative PRN treatment(s):   None at this time     Recent Visits Date Type Provider Dept  12/24/19 Procedure visit Milinda Pointer, MD [facility]  11/07/19 Office Visit Milinda Pointer, MD [facility]  Showing recent visits within past 90 days and meeting all other requirements Future  Appointments Date Type Provider Dept  02/06/20 Appointment Milinda Pointer, MD [facility]  Showing future appointments within next 90 days and meeting all other requirements  I discussed the assessment and treatment plan with the patient. The patient was provided an opportunity to ask questions and all were answered. The patient agreed with the plan and demonstrated an understanding of the instructions.  Patient advised to call back or seek an in-person evaluation if the symptoms or condition worsens.  Duration of encounter: *** minutes.  Note by: Gaspar Cola, MD Date: 02/06/2020; Time: 1:38 PM

## 2020-02-06 ENCOUNTER — Encounter: Payer: Managed Care, Other (non HMO) | Admitting: Pain Medicine

## 2020-02-07 ENCOUNTER — Other Ambulatory Visit: Payer: Self-pay | Admitting: Primary Care

## 2020-02-09 ENCOUNTER — Other Ambulatory Visit: Payer: Self-pay | Admitting: Family Medicine

## 2020-02-11 NOTE — Telephone Encounter (Signed)
Pharmacy requests refill on: Albuterol 108 mcg/act inhaler   LAST REFILL: 11/01/2018 (Q-18 g, R-3)  LAST OV: 11/23/2019 NEXT OV: Not Scheduled  PHARMACY: [id number] [place], [place]

## 2020-03-08 ENCOUNTER — Other Ambulatory Visit: Payer: Self-pay | Admitting: Primary Care

## 2020-04-01 ENCOUNTER — Encounter: Payer: Managed Care, Other (non HMO) | Admitting: Obstetrics and Gynecology

## 2020-04-29 NOTE — Telephone Encounter (Signed)
error 

## 2020-05-03 NOTE — Progress Notes (Signed)
PROVIDER NOTE: Information contained herein reflects review and annotations entered in association with encounter. Interpretation of such information and data should be left to medically-trained personnel. Information provided to patient can be located elsewhere in the medical record under "Patient Instructions". Document created using STT-dictation technology, any transcriptional errors that may result from process are unintentional.    Patient: Debra Myers  Service Category: E/M  Provider: Gaspar Cola, MD  DOB: [birth year]  DOS: 05/05/2020  Specialty: Interventional Pain Management  MRN: [id number]  Setting: Ambulatory outpatient  PCP: Pleas Koch, NP  Type: Established Patient    Referring Provider: Pleas Koch, NP  Location: Office  Delivery: Face-to-face     HPI  [name], a 55 y.o. year old female, is here today because of her Chronic bilateral low back pain without sciatica [M54.50, G89.29]. [name] primary complain today is Hip Pain Last encounter: My last encounter with her was on 12/24/2019. Pertinent problems: [name] has Numbness of upper extremity; Radiculitis involving upper extremity; Chronic low back pain (Primary Area of Pain) (Bilateral) (L>R); Lumbar spondylosis (Bulging Disc & Severe Left Foraminal Stenosis at L3-4); Chronic lower extremity pain (Secondary area of Pain) (Left); Lumbar foraminal stenosis (Severe Left L3-4); Lumbar facet syndrome (Bilateral) (L>R); Cervical spondylosis (C5-6 & C6-7 DDD); Cervical (3 mm) Grade 1 Anterolisthesis of C4 over C5; Chronic neck pain (Third area of Pain) (Bilateral) (L>R); Chronic cervical radicular pain (Bilateral) (L>R); Cervical foraminal stenosis (multilevel) (Bilateral); Muscle spasm, nocturnal; Musculoskeletal pain; Carpal tunnel syndrome (Bilateral) (L>R); Cervical facet syndrome (Bilateral) (L>R); Chronic pain syndrome; DDD (degenerative disc disease), lumbar; DDD (degenerative disc disease), cervical;  Pain in right knee; Chronic pain of left knee; Osteoarthritis involving multiple joints; and Acute exacerbation of chronic low back pain on their pertinent problem list. Pain Assessment: Severity of Chronic pain is reported as a 1 /10. Location: Hip Left,Right/Denies. Onset: More than a month ago. Quality: Aching,Numbness,Burning. Timing: Intermittent. Modifying factor(s): meds, physical therapy, ice , resting. Vitals:  height is '5\' 4"'  (1.626 m) and weight is 233 lb (105.7 kg). Her temperature is 97.4 F (36.3 C) (abnormal). Her blood pressure is 158/91 (abnormal) and her pulse is 93. Her oxygen saturation is 99%.   Reason for encounter: medication management.   The patient indicates doing well with the current medication regimen. No adverse reactions or side effects reported to the medications.  She refers having purchased a new mattress which has helped her pain.  She is still going to the chiropractor twice a month and this seems to help with her SI joint pain.  RTCB: 08/05/2020 Nonopioids transferred to 12/24/2019: Skelaxin  Pharmacotherapy Assessment   Analgesic: Hydrocodone/APAP 5/325, 2 tab PO QD (10 mg/day of hydrocodone) MME/day:10 mg/day.   Monitoring: [place] PMP: PDMP reviewed during this encounter.       Pharmacotherapy: No side-effects or adverse reactions reported. Compliance: No problems identified. Effectiveness: Clinically acceptable.  Chauncey Fischer, RN  05/05/2020 11:01 AM  Sign when Signing Visit Nursing Pain Medication Assessment:  Safety precautions to be maintained throughout the outpatient stay will include: orient to surroundings, keep bed in low position, maintain call bell within reach at all times, provide assistance with transfer out of bed and ambulation.  Medication Inspection Compliance: Pill count conducted under aseptic conditions, in front of the patient. Neither the pills nor the bottle was removed from the patient's sight at any time. Once count was completed  pills were immediately returned to the patient in their  original bottle.  Medication: Hydrocodone/APAP Pill/Patch Count: 2 of 60 pills remain Pill/Patch Appearance: Markings consistent with prescribed medication Bottle Appearance: Standard pharmacy container. Clearly labeled. Filled Date: 2 / 10 / 22 Last Medication intake:  TodaySafety precautions to be maintained throughout the outpatient stay will include: orient to surroundings, keep bed in low position, maintain call bell within reach at all times, provide assistance with transfer out of bed and ambulation.     UDS:  Summary  Date Value Ref Range Status  11/07/2019 Note  Final    Comment:    ==================================================================== [facility] (MW) ==================================================================== Test                             Result       Flag       Units  Drug Present and Declared for Prescription Verification   Amphetamine                    1429         EXPECTED   ng/mg creat    Amphetamine is available as a schedule II prescription drug.    Lorazepam                      261          EXPECTED   ng/mg creat    Source of lorazepam is a scheduled prescription medication.    Hydrocodone                    873          EXPECTED   ng/mg creat   Dihydrocodeine                 116          EXPECTED   ng/mg creat   Norhydrocodone                 1331         EXPECTED   ng/mg creat    Sources of hydrocodone include scheduled prescription medications.    Dihydrocodeine and norhydrocodone are expected metabolites of    hydrocodone. Dihydrocodeine is also available as a scheduled    prescription medication.  ==================================================================== Test                      Result    Flag   Units      Ref Range   Creatinine              89               mg/dL      >=20 ==================================================================== Declared  Medications:  The flagging and interpretation on this report are based on the  following declared medications.  Unexpected results may arise from  inaccuracies in the declared medications.   **Note: The testing scope of this panel includes these medications:   Amphetamine (Vyvanse)  Hydrocodone (Norco)  Lorazepam (Ativan)   **Note: The testing scope of this panel does not include the  following reported medications:   Acetaminophen (Norco)  Albuterol (Ventolin HFA)  Aspirin  Atorvastatin (Lipitor)  Bupropion (Wellbutrin XL)  Cetirizine (Zyrtec)  Fluticasone (Flonase)  Ibuprofen (Advil)  Lamotrigine (Lamictal)  Metaxalone (Skelaxin)  Metformin (Glucophage)  Montelukast (Singulair)  Paroxetine  Spironolactone (Aldactone) ==================================================================== For clinical consultation, please call [phone]. ====================================================================  ROS  Constitutional: Denies any fever or chills Gastrointestinal: No reported hemesis, hematochezia, vomiting, or acute GI distress Musculoskeletal: Denies any acute onset joint swelling, redness, loss of ROM, or weakness Neurological: No reported episodes of acute onset apraxia, aphasia, dysarthria, agnosia, amnesia, paralysis, loss of coordination, or loss of consciousness  Medication Review  HYDROcodone-acetaminophen, LORazepam, PARoxetine, albuterol, aspirin, atorvastatin, buPROPion, cetirizine, fluticasone, ibuprofen, lamoTRIgine, lisdexamfetamine, metFORMIN, metaxalone, montelukast, and spironolactone  History Review  Allergy: Ms. Ellingwood is allergic to oxycodone, penicillins, red dye, and sulfa antibiotics. Drug: Ms. Kaylor  reports no history of drug use. Alcohol:  reports no history of alcohol use. Tobacco:  reports that she has never smoked. She has never used smokeless tobacco. Social: Ms. Vath  reports that she has never smoked. She has never used  smokeless tobacco. She reports that she does not drink alcohol and does not use drugs. Medical:  has a past medical history of Anxiety, Asthma, Bulging lumbar disc (L3-4) (02/26/2015), Degenerative disc disease, Degenerative lumbar disc, Depression, Diabetes mellitus, Elective abortion, GERD (gastroesophageal reflux disease), Hypertension, Stroke ([place]) (06/11/2015), and Vaginal delivery. Surgical: Ms. Vasudevan  has a past surgical history that includes Cholecystectomy (2001); Lapband (04/2012); Wisdom tooth extraction; Dilatation & curettage/hysteroscopy with myosure (N/A, 09/10/2014); mirena; and lap band surgery. Family: family history includes Asthma in her maternal grandmother; Diabetes in her father; Hypertension in her father and mother; Stroke in her maternal grandfather and maternal grandmother.  Laboratory Chemistry Profile   Renal Lab Results  Component Value Date   BUN 15 11/23/2019   CREATININE 0.92 78/67/6720   BCR NOT APPLICABLE 94/70/9628   GFR 58.78 (L) 02/08/2019   GFRAA >60 12/16/2017   GFRNONAA >60 12/16/2017     Hepatic Lab Results  Component Value Date   AST 13 11/23/2019   ALT 14 11/23/2019   ALBUMIN 4.4 02/08/2019   ALKPHOS 108 02/08/2019   HCVAB NEGATIVE 05/18/2013     Electrolytes Lab Results  Component Value Date   NA 140 11/23/2019   K 4.8 11/23/2019   CL 105 11/23/2019   CALCIUM 9.3 11/23/2019   PHOS 4.0 10/31/2015     Bone Lab Results  Component Value Date   VD25OH 31.95 02/08/2019     Inflammation (CRP: Acute Phase) (ESR: Chronic Phase) Lab Results  Component Value Date   ESRSEDRATE 32 05/26/2017       Note: Above Lab results reviewed.  Recent Imaging Review  VAS [name] LOWER EXTREMITY VENOUS (DVT) ([facility] and [facility] 7a-7p)  Lower Venous Study  Indications: Pain.   Performing Technologist: Maudry Mayhew MHA, RDMS, RVT, RDCS    Examination Guidelines: A complete evaluation includes B-mode imaging, spectral Doppler, color Doppler, and power  Doppler as needed of all accessible portions of each vessel. Bilateral testing is considered an integral part of a complete examination. Limited examinations for reoccurring indications may be performed as noted.    Right Venous Findings: +---+---------------+---------+-----------+----------+-------+    CompressibilityPhasicitySpontaneityPropertiesSummary +---+---------------+---------+-----------+----------+-------+ CFVFull           Yes      Yes                          +---+---------------+---------+-----------+----------+-------+     Left Venous Findings: +---------+---------------+---------+-----------+----------+-------+          CompressibilityPhasicitySpontaneityPropertiesSummary +---------+---------------+---------+-----------+----------+-------+ CFV      Full           Yes      Yes                          +---------+---------------+---------+-----------+----------+-------+  SFJ      Full                                                 +---------+---------------+---------+-----------+----------+-------+ FV Prox  Full                                                 +---------+---------------+---------+-----------+----------+-------+ FV Mid   Full                                                 +---------+---------------+---------+-----------+----------+-------+ FV DistalFull                                                 +---------+---------------+---------+-----------+----------+-------+ PFV      Full                                                 +---------+---------------+---------+-----------+----------+-------+ POP      Full           Yes      Yes                          +---------+---------------+---------+-----------+----------+-------+ PTV      Full                                                 +---------+---------------+---------+-----------+----------+-------+ PERO     Full                                                  +---------+---------------+---------+-----------+----------+-------+          Summary: Right: No evidence of common femoral vein obstruction. Left: There is no evidence of deep vein thrombosis in the lower extremity. No cystic structure found in the popliteal fossa.   *See table(s) above for measurements and observations.  Electronically signed by Monica Martinez MD on 12/16/2017 at 6:04:46 PM.      Final   Note: Reviewed        Physical Exam  General appearance: Well nourished, well developed, and well hydrated. In no apparent acute distress Mental status: Alert, oriented x 3 (person, place, & time)       Respiratory: No evidence of acute respiratory distress Eyes: PERLA Vitals: BP (!) 158/91   Pulse 93   Temp (!) 97.4 F (36.3 C)   Ht '5\' 4"'  (1.626 m)   Wt 233 lb (105.7 kg)   SpO2 99%   BMI 39.99 kg/m  BMI: Estimated body mass index is 39.99 kg/m as  calculated from the following:   Height as of this encounter: '5\' 4"'  (1.626 m).   Weight as of this encounter: 233 lb (105.7 kg). Ideal: Ideal body weight: 54.7 kg (120 lb 9.5 oz) Adjusted ideal body weight: 75.1 kg (165 lb 8.9 oz)  Assessment   Status Diagnosis  Controlled Controlled Controlled 1. Chronic low back pain (Primary Area of Pain) (Bilateral) (L>R)   2. Chronic pain syndrome   3. Chronic lower extremity pain (Secondary area of Pain) (Left)   4. Chronic neck pain (Third area of Pain) (Bilateral) (L>R)   5. Pharmacologic therapy   6. Uncomplicated opioid dependence ([place])      Updated Problems: Problem  Binge Eating Disorder    Plan of Care  Problem-specific:  No problem-specific Assessment & Plan notes found for this encounter.  Ms. DANAY MCKELLAR has a current medication list which includes the following long-term medication(s): albuterol, atorvastatin, fluticasone, metaxalone, metformin, montelukast, spironolactone, [START ON 05/07/2020]  hydrocodone-acetaminophen, [START ON 06/06/2020] hydrocodone-acetaminophen, and [START ON 07/06/2020] hydrocodone-acetaminophen.  Pharmacotherapy (Medications Ordered): Meds ordered this encounter  Medications  . HYDROcodone-acetaminophen (NORCO/VICODIN) 5-325 MG tablet    Sig: Take 0.5-1 tablets by mouth 2 (two) times daily as needed for severe pain. Must last 30 days    Dispense:  60 tablet    Refill:  0    Chronic Pain: STOP Act (Not applicable) Fill 1 day early if closed on refill date. Avoid benzodiazepines within 8 hours of opioids  . HYDROcodone-acetaminophen (NORCO/VICODIN) 5-325 MG tablet    Sig: Take 0.5-1 tablets by mouth 2 (two) times daily as needed for severe pain. Must last 30 days    Dispense:  60 tablet    Refill:  0    Chronic Pain: STOP Act (Not applicable) Fill 1 day early if closed on refill date. Avoid benzodiazepines within 8 hours of opioids  . HYDROcodone-acetaminophen (NORCO/VICODIN) 5-325 MG tablet    Sig: Take 0.5-1 tablets by mouth 2 (two) times daily as needed for severe pain. Must last 30 days    Dispense:  60 tablet    Refill:  0    Chronic Pain: STOP Act (Not applicable) Fill 1 day early if closed on refill date. Avoid benzodiazepines within 8 hours of opioids   Orders:  No orders of the defined types were placed in this encounter.  Follow-up plan:   Return in about 3 months (around 08/05/2020) for (F2F), (MM).      Interventional management options: Planned, scheduled, and/or pending:      Considering:   Diagnostic bilateral lumbar facet block  Possible bilateral lumbar facet RFA.  Diagnostic left L3-4 LESI  Diagnostic left L3 TFESI  Diagnostic left L4 TFESI  Diagnostic bilateral L5 TFESI  Diagnostic left CESI  Diagnostic bilateral cervical facet block  Possible bilateral cervical facet RFA.    Palliative PRN treatment(s):   None at this time     Recent Visits No visits were found meeting these conditions. Showing recent visits within past  90 days and meeting all other requirements Today's Visits Date Type Provider Dept  05/05/20 Office Visit Milinda Pointer, MD [facility]  Showing today's visits and meeting all other requirements Future Appointments Date Type Provider Dept  07/28/20 Appointment Milinda Pointer, MD [facility]  Showing future appointments within next 90 days and meeting all other requirements  I discussed the assessment and treatment plan with the patient. The patient was provided an opportunity to ask questions and all were answered.  The patient agreed with the plan and demonstrated an understanding of the instructions.  Patient advised to call back or seek an in-person evaluation if the symptoms or condition worsens.  Duration of encounter: 30 minutes.  Note by: Gaspar Cola, MD Date: 05/05/2020; Time: 1:26 PM

## 2020-05-05 ENCOUNTER — Encounter: Payer: Self-pay | Admitting: Pain Medicine

## 2020-05-05 ENCOUNTER — Other Ambulatory Visit: Payer: Self-pay

## 2020-05-05 ENCOUNTER — Ambulatory Visit: Payer: Managed Care, Other (non HMO) | Attending: Pain Medicine | Admitting: Pain Medicine

## 2020-05-05 VITALS — BP 158/91 | HR 93 | Temp 97.4°F | Ht 64.0 in | Wt 233.0 lb

## 2020-05-05 DIAGNOSIS — M542 Cervicalgia: Secondary | ICD-10-CM

## 2020-05-05 DIAGNOSIS — G8929 Other chronic pain: Secondary | ICD-10-CM | POA: Diagnosis present

## 2020-05-05 DIAGNOSIS — F1120 Opioid dependence, uncomplicated: Secondary | ICD-10-CM | POA: Diagnosis present

## 2020-05-05 DIAGNOSIS — Z79899 Other long term (current) drug therapy: Secondary | ICD-10-CM | POA: Insufficient documentation

## 2020-05-05 DIAGNOSIS — G894 Chronic pain syndrome: Secondary | ICD-10-CM | POA: Diagnosis present

## 2020-05-05 DIAGNOSIS — M79605 Pain in left leg: Secondary | ICD-10-CM | POA: Insufficient documentation

## 2020-05-05 DIAGNOSIS — M545 Low back pain, unspecified: Secondary | ICD-10-CM | POA: Insufficient documentation

## 2020-05-05 MED ORDER — HYDROCODONE-ACETAMINOPHEN 5-325 MG PO TABS: 0.5000 | ORAL_TABLET | Freq: Two times a day (BID) | ORAL | 0 refills | Status: DC | PRN

## 2020-05-05 NOTE — Progress Notes (Signed)
Nursing Pain Medication Assessment:  Safety precautions to be maintained throughout the outpatient stay will include: orient to surroundings, keep bed in low position, maintain call bell within reach at all times, provide assistance with transfer out of bed and ambulation.  Medication Inspection Compliance: Pill count conducted under aseptic conditions, in front of the patient. Neither the pills nor the bottle was removed from the patient's sight at any time. Once count was completed pills were immediately returned to the patient in their original bottle.  Medication: Hydrocodone/APAP Pill/Patch Count: 2 of 60 pills remain Pill/Patch Appearance: Markings consistent with prescribed medication Bottle Appearance: Standard pharmacy container. Clearly labeled. Filled Date: 2 / 10 / 22 Last Medication intake:  TodaySafety precautions to be maintained throughout the outpatient stay will include: orient to surroundings, keep bed in low position, maintain call bell within reach at all times, provide assistance with transfer out of bed and ambulation.

## 2020-05-05 NOTE — Patient Instructions (Signed)
____________________________________________________________________________________________  Medication Rules  Purpose: To inform patients, and their family members, of our rules and regulations.  Applies to: All patients receiving prescriptions (written or electronic).  Pharmacy of record: Pharmacy where electronic prescriptions will be sent. If written prescriptions are taken to a different pharmacy, please inform the nursing staff. The pharmacy listed in the electronic medical record should be the one where you would like electronic prescriptions to be sent.  Electronic prescriptions: In compliance with the Impact Strengthen Opioid Misuse Prevention (STOP) Act of 2017 (Session Law 2017-74/H243), effective February 22, 2018, all controlled substances must be electronically prescribed. Calling prescriptions to the pharmacy will cease to exist.  Prescription refills: Only during scheduled appointments. Applies to all prescriptions.  NOTE: The following applies primarily to controlled substances (Opioid* Pain Medications).   Type of encounter (visit): For patients receiving controlled substances, face-to-face visits are required. (Not an option or up to the patient.)  Patient's responsibilities: 1. Pain Pills: Bring all pain pills to every appointment (except for procedure appointments). 2. Pill Bottles: Bring pills in original pharmacy bottle. Always bring the newest bottle. Bring bottle, even if empty. 3. Medication refills: You are responsible for knowing and keeping track of what medications you take and those you need refilled. The day before your appointment: write a list of all prescriptions that need to be refilled. The day of the appointment: give the list to the admitting nurse. Prescriptions will be written only during appointments. No prescriptions will be written on procedure days. If you forget a medication: it will not be "Called in", "Faxed", or "electronically sent".  You will need to get another appointment to get these prescribed. No early refills. Do not call asking to have your prescription filled early. 4. Prescription Accuracy: You are responsible for carefully inspecting your prescriptions before leaving our office. Have the discharge nurse carefully go over each prescription with you, before taking them home. Make sure that your name is accurately spelled, that your address is correct. Check the name and dose of your medication to make sure it is accurate. Check the number of pills, and the written instructions to make sure they are clear and accurate. Make sure that you are given enough medication to last until your next medication refill appointment. 5. Taking Medication: Take medication as prescribed. When it comes to controlled substances, taking less pills or less frequently than prescribed is permitted and encouraged. Never take more pills than instructed. Never take medication more frequently than prescribed.  6. Inform other Doctors: Always inform, all of your healthcare providers, of all the medications you take. 7. Pain Medication from other Providers: You are not allowed to accept any additional pain medication from any other Doctor or Healthcare provider. There are two exceptions to this rule. (see below) In the event that you require additional pain medication, you are responsible for notifying us, as stated below. 8. Cough Medicine: Often these contain an opioid, such as codeine or hydrocodone. Never accept or take cough medicine containing these opioids if you are already taking an opioid* medication. The combination may cause respiratory failure and death. 9. Medication Agreement: You are responsible for carefully reading and following our Medication Agreement. This must be signed before receiving any prescriptions from our practice. Safely store a copy of your signed Agreement. Violations to the Agreement will result in no further prescriptions.  (Additional copies of our Medication Agreement are available upon request.) 10. Laws, Rules, & Regulations: All patients are expected to follow all   Federal and State Laws, Statutes, Rules, & Regulations. Ignorance of the Laws does not constitute a valid excuse.  11. Illegal drugs and Controlled Substances: The use of illegal substances (including, but not limited to marijuana and its derivatives) and/or the illegal use of any controlled substances is strictly prohibited. Violation of this rule may result in the immediate and permanent discontinuation of any and all prescriptions being written by our practice. The use of any illegal substances is prohibited. 12. Adopted CDC guidelines & recommendations: Target dosing levels will be at or below 60 MME/day. Use of benzodiazepines** is not recommended.  Exceptions: There are only two exceptions to the rule of not receiving pain medications from other Healthcare Providers. 1. Exception #1 (Emergencies): In the event of an emergency (i.e.: accident requiring emergency care), you are allowed to receive additional pain medication. However, you are responsible for: As soon as you are able, call our office [phone], at any time of the day or night, and leave a message stating your name, the date and nature of the emergency, and the name and dose of the medication prescribed. In the event that your call is answered by a member of our staff, make sure to document and save the date, time, and the name of the person that took your information.  2. Exception #2 (Planned Surgery): In the event that you are scheduled by another doctor or dentist to have any type of surgery or procedure, you are allowed (for a period no longer than 30 days), to receive additional pain medication, for the acute post-op pain. However, in this case, you are responsible for picking up a copy of our "Post-op Pain Management for Surgeons" handout, and giving it to your surgeon or dentist. This  document is available at our office, and does not require an appointment to obtain it. Simply go to our office during business hours (Monday-Thursday from 8:00 AM to 4:00 PM) (Friday 8:00 AM to 12:00 Noon) or if you have a scheduled appointment with us, prior to your surgery, and ask for it by name. In addition, you are responsible for: calling our office [phone], at any time of the day or night, and leaving a message stating your name, name of your surgeon, type of surgery, and date of procedure or surgery. Failure to comply with your responsibilities may result in termination of therapy involving the controlled substances.  *Opioid medications include: morphine, codeine, oxycodone, oxymorphone, hydrocodone, hydromorphone, meperidine, tramadol, tapentadol, buprenorphine, fentanyl, methadone. **Benzodiazepine medications include: diazepam (Valium), alprazolam (Xanax), clonazepam (Klonopine), lorazepam (Ativan), clorazepate (Tranxene), chlordiazepoxide (Librium), estazolam (Prosom), oxazepam (Serax), temazepam (Restoril), triazolam (Halcion) (Last updated: 01/21/2020) ____________________________________________________________________________________________   ____________________________________________________________________________________________  Medication Recommendations and Reminders  Applies to: All patients receiving prescriptions (written and/or electronic).  Medication Rules & Regulations: These rules and regulations exist for your safety and that of others. They are not flexible and neither are we. Dismissing or ignoring them will be considered "non-compliance" with medication therapy, resulting in complete and irreversible termination of such therapy. (See document titled "Medication Rules" for more details.) In all conscience, because of safety reasons, we cannot continue providing a therapy where the patient does not follow instructions.  Pharmacy of record:   Definition:  This is the pharmacy where your electronic prescriptions will be sent.   We do not endorse any particular pharmacy, however, we have experienced problems with [facility] not securing enough medication supply for the community.  We do not restrict you in your choice of pharmacy. However,   once we write for your prescriptions, we will NOT be re-sending more prescriptions to fix restricted supply problems created by your pharmacy, or your insurance.   The pharmacy listed in the electronic medical record should be the one where you want electronic prescriptions to be sent.  If you choose to change pharmacy, simply notify our nursing staff.  Recommendations:  Keep all of your pain medications in a safe place, under lock and key, even if you live alone. We will NOT replace lost, stolen, or damaged medication.  After you fill your prescription, take 1 week's worth of pills and put them away in a safe place. You should keep a separate, properly labeled bottle for this purpose. The remainder should be kept in the original bottle. Use this as your primary supply, until it runs out. Once it's gone, then you know that you have 1 week's worth of medicine, and it is time to come in for a prescription refill. If you do this correctly, it is unlikely that you will ever run out of medicine.  To make sure that the above recommendation works, it is very important that you make sure your medication refill appointments are scheduled at least 1 week before you run out of medicine. To do this in an effective manner, make sure that you do not leave the office without scheduling your next medication management appointment. Always ask the nursing staff to show you in your prescription , when your medication will be running out. Then arrange for the receptionist to get you a return appointment, at least 7 days before you run out of medicine. Do not wait until you have 1 or 2 pills left, to come in. This is very poor planning and  does not take into consideration that we may need to cancel appointments due to bad weather, sickness, or emergencies affecting our staff.  DO NOT ACCEPT A "Partial Fill": If for any reason your pharmacy does not have enough pills/tablets to completely fill or refill your prescription, do not allow for a "partial fill". The law allows the pharmacy to complete that prescription within 72 hours, without requiring a new prescription. If they do not fill the rest of your prescription within those 72 hours, you will need a separate prescription to fill the remaining amount, which we will NOT provide. If the reason for the partial fill is your insurance, you will need to talk to the pharmacist about payment alternatives for the remaining tablets, but again, DO NOT ACCEPT A PARTIAL FILL, unless you can trust your pharmacist to obtain the remainder of the pills within 72 hours.  Prescription refills and/or changes in medication(s):   Prescription refills, and/or changes in dose or medication, will be conducted only during scheduled medication management appointments. (Applies to both, written and electronic prescriptions.)  No refills on procedure days. No medication will be changed or started on procedure days. No changes, adjustments, and/or refills will be conducted on a procedure day. Doing so will interfere with the diagnostic portion of the procedure.  No phone refills. No medications will be "called into the pharmacy".  No Fax refills.  No weekend refills.  No Holliday refills.  No after hours refills.  Remember:  Business hours are:  Monday to Thursday 8:00 AM to 4:00 PM Provider's Schedule: Francisco Naveira, MD - Appointments are:  Medication management: Monday and Wednesday 8:00 AM to 4:00 PM Procedure day: Tuesday and Thursday 7:30 AM to 4:00 PM Bilal Lateef, MD - Appointments are:    Medication management: Tuesday and Thursday 8:00 AM to 4:00 PM Procedure day: Monday and Wednesday  7:30 AM to 4:00 PM (Last update: 09/12/2019) ____________________________________________________________________________________________   ____________________________________________________________________________________________  CBD (cannabidiol) WARNING  Applicable to: All individuals currently taking or considering taking CBD (cannabidiol) and, more important, all patients taking opioid analgesic controlled substances (pain medication). (Example: oxycodone; oxymorphone; hydrocodone; hydromorphone; morphine; methadone; tramadol; tapentadol; fentanyl; buprenorphine; butorphanol; dextromethorphan; meperidine; codeine; etc.)  Legal status: CBD remains a Schedule I drug prohibited for any use. CBD is illegal with one exception. In the [place], CBD has a limited Food and Drug Administration (FDA) approval for the treatment of two specific types of epilepsy disorders. Only one CBD product has been approved by the FDA for this purpose: "Epidiolex". FDA is aware that some companies are marketing products containing cannabis and cannabis-derived compounds in ways that violate the Federal Food, Drug and Cosmetic Act (FD&C Act) and that may put the health and safety of consumers at risk. The FDA, a Federal agency, has not enforced the CBD status since 2018.   Legality: Some manufacturers ship CBD products nationally, which is illegal. Often such products are sold online and are therefore available throughout the country. CBD is openly sold in head shops and health food stores in some states where such sales have not been explicitly legalized. Selling unapproved products with unsubstantiated therapeutic claims is not only a violation of the law, but also can put patients at risk, as these products have not been proven to be safe or effective. Federal illegality makes it difficult to conduct research on CBD.  Reference: "FDA Regulation of Cannabis and Cannabis-Derived Products, Including Cannabidiol  (CBD)" - https://www.fda.gov/news-events/public-health-focus/fda-regulation-cannabis-and-cannabis-derived-products-including-cannabidiol-cbd  Warning: CBD is not FDA approved and has not undergo the same manufacturing controls as prescription drugs.  This means that the purity and safety of available CBD may be questionable. Most of the time, despite manufacturer's claims, it is contaminated with THC (delta-9-tetrahydrocannabinol - the chemical in marijuana responsible for the "HIGH").  When this is the case, the THC contaminant will trigger a positive urine drug screen (UDS) test for Marijuana (carboxy-THC). Because a positive UDS for any illicit substance is a violation of our medication agreement, your opioid analgesics (pain medicine) may be permanently discontinued.  MORE ABOUT CBD  General Information: CBD  is a derivative of the Marijuana (cannabis sativa) plant discovered in 1940. It is one of the 113 identified substances found in Marijuana. It accounts for up to 40% of the plant's extract. As of 2018, preliminary clinical studies on CBD included research for the treatment of anxiety, movement disorders, and pain. CBD is available and consumed in multiple forms, including inhalation of smoke or vapor, as an aerosol spray, and by mouth. It may be supplied as an oil containing CBD, capsules, dried cannabis, or as a liquid solution. CBD is thought not to be as psychoactive as THC (delta-9-tetrahydrocannabinol - the chemical in marijuana responsible for the "HIGH"). Studies suggest that CBD may interact with different biological target receptors in the body, including cannabinoid and other neurotransmitter receptors. As of 2018 the mechanism of action for its biological effects has not been determined.  Side-effects  Adverse reactions: Dry mouth, diarrhea, decreased appetite, fatigue, drowsiness, malaise, weakness, sleep disturbances, and others.  Drug interactions: CBC may interact with other  medications such as blood-thinners. (Last update: 09/29/2019) ____________________________________________________________________________________________    

## 2020-05-07 ENCOUNTER — Telehealth: Payer: Self-pay

## 2020-05-07 NOTE — Telephone Encounter (Signed)
[facility]  The patient has called around and found the [facility] does have it.  It is in her list of pharmacies.  Will you send it there instead?

## 2020-05-08 ENCOUNTER — Other Ambulatory Visit: Payer: Self-pay | Admitting: Pain Medicine

## 2020-05-08 ENCOUNTER — Telehealth: Payer: Self-pay | Admitting: Pain Medicine

## 2020-05-08 DIAGNOSIS — G894 Chronic pain syndrome: Secondary | ICD-10-CM

## 2020-05-08 MED ORDER — HYDROCODONE-ACETAMINOPHEN 5-325 MG PO TABS: 0.5000 | ORAL_TABLET | Freq: Two times a day (BID) | ORAL | 0 refills | Status: DC | PRN

## 2020-05-08 NOTE — Progress Notes (Signed)
Patient called indicating that [facility] does not have her hydrocod[facility].  She called around and found out that [facility] had hydrocodone and she wants [name] to send the prescriptions over to [facility].  I ordered the nursing staff to call [facility] and cancel all the prescriptions that I had sent there and I will be sending new ones to [facility].

## 2020-05-08 NOTE — Telephone Encounter (Signed)
Patient is calling about medications being switched to [facility] ? Please check and let patient know

## 2020-05-08 NOTE — Telephone Encounter (Signed)
Patient is calling again to inquire about medications.  She is out and needs them to be sent to [facility] because [facility].

## 2020-07-11 ENCOUNTER — Other Ambulatory Visit: Payer: Self-pay | Admitting: Primary Care

## 2020-07-11 DIAGNOSIS — E282 Polycystic ovarian syndrome: Secondary | ICD-10-CM

## 2020-07-11 DIAGNOSIS — L680 Hirsutism: Secondary | ICD-10-CM

## 2020-07-16 DIAGNOSIS — M7918 Myalgia, other site: Secondary | ICD-10-CM

## 2020-07-16 DIAGNOSIS — M62838 Other muscle spasm: Secondary | ICD-10-CM

## 2020-07-17 MED ORDER — METAXALONE 800 MG PO TABS: 800.0000 mg | ORAL_TABLET | Freq: Three times a day (TID) | ORAL | 0 refills | Status: DC | PRN

## 2020-07-27 DIAGNOSIS — Z79891 Long term (current) use of opiate analgesic: Secondary | ICD-10-CM | POA: Insufficient documentation

## 2020-07-27 NOTE — Progress Notes (Signed)
PROVIDER NOTE: Information contained herein reflects review and annotations entered in association with encounter. Interpretation of such information and data should be left to medically-trained personnel. Information provided to patient can be located elsewhere in the medical record under "Patient Instructions". Document created using STT-dictation technology, any transcriptional errors that may result from process are unintentional.    Patient: Debra Myers  Service Category: E/M  Provider: Gaspar Cola, MD  DOB: 1966-01-30  DOS: 07/28/2020  Specialty: Interventional Pain Management  MRN: [id number]  Setting: Ambulatory outpatient  PCP: Pleas Koch, NP  Type: Established Patient    Referring Provider: Pleas Koch, NP  Location: Office  Delivery: Face-to-face     HPI  [name], a 55 y.o. year old female, is here today because of her Chronic pain syndrome [G89.4]. Ms. Mijangos primary complain today is Back Pain (Low and mid) and Neck Pain Last encounter: My last encounter with her was on 05/08/2020. Pertinent problems: Ms. Carrithers has Numbness of upper extremity; Radiculitis involving upper extremity; Chronic low back pain (Primary Area of Pain) (Bilateral) (L>R); Lumbar spondylosis (Bulging Disc & Severe Left Foraminal Stenosis at L3-4); Chronic lower extremity pain (Secondary area of Pain) (Left); Lumbar foraminal stenosis (Severe Left L3-4); Lumbar facet syndrome (Bilateral) (L>R); Cervical spondylosis (C5-6 & C6-7 DDD); Cervical (3 mm) Grade 1 Anterolisthesis of C4 over C5; Chronic neck pain (Third area of Pain) (Bilateral) (L>R); Chronic cervical radicular pain (Bilateral) (L>R); Cervical foraminal stenosis (multilevel) (Bilateral); Muscle spasm, nocturnal; Musculoskeletal pain; Carpal tunnel syndrome (Bilateral) (L>R); Cervical facet syndrome (Bilateral) (L>R); Chronic pain syndrome; DDD (degenerative disc disease), lumbar; DDD (degenerative disc disease), cervical; Pain in  right knee; Chronic pain of left knee; Osteoarthritis involving multiple joints; and Acute exacerbation of chronic low back pain on their pertinent problem list. Pain Assessment: Severity of Chronic pain is reported as a 2 /10. Location: Back (neck) Lower,Mid/ . Onset: More than a month ago. Quality: Aching,Sore (deep). Timing: Constant. Modifying factor(s): medications, chiropractor, gym, rest, distractions. Vitals:  height is _0  (1.626 m) and weight is 235 lb 9.6 oz (106.9 kg). Her temporal temperature is 97.3 F (36.3 C) (abnormal). Her blood pressure is 141/82 (abnormal) and her pulse is 88. Her respiration is 18 and oxygen saturation is 98%.   Reason for encounter: medication management.   The patient indicates doing well with the current medication regimen. No adverse reactions or side effects reported to the medications.   RTCB: 11/07/2020 Nonopioids transferred to 12/24/2019: Skelaxin  Pharmacotherapy Assessment   Analgesic: Hydrocodone/APAP 5/325, 2 tab PO QD (10 mg/day of hydrocodone) MME/day:10 mg/day.   Monitoring: [place] PMP: PDMP reviewed during this encounter.       Pharmacotherapy: No side-effects or adverse reactions reported. Compliance: No problems identified. Effectiveness: Clinically acceptable.  Hart Rochester, RN  07/28/2020 12:06 PM  Signed Nursing Pain Medication Assessment:  Safety precautions to be maintained throughout the outpatient stay will include: orient to surroundings, keep bed in low position, maintain call bell within reach at all times, provide assistance with transfer out of bed and ambulation.  Medication Inspection Compliance: Pill count conducted under aseptic conditions, in front of the patient. Neither the pills nor the bottle was removed from the patient's sight at any time. Once count was completed pills were immediately returned to the patient in their original bottle.  Medication: Hydrocodone/APAP Pill/Patch Count: 23.5 of 60 pills  remain Pill/Patch Appearance: Markings consistent with prescribed medication Bottle Appearance: Standard pharmacy container. Clearly labeled.  Filled Date: 05 / 19 / 2022 Last Medication intake:  Today    UDS:  Summary  Date Value Ref Range Status  11/07/2019 Note  Final    Comment:    ==================================================================== [employer] Select 13 (MW) ==================================================================== Test                             Result       Flag       Units  Drug Present and Declared for Prescription Verification   Amphetamine                    1429         EXPECTED   ng/mg creat    Amphetamine is available as a schedule II prescription drug.    Lorazepam                      261          EXPECTED   ng/mg creat    Source of lorazepam is a scheduled prescription medication.    Hydrocodone                    873          EXPECTED   ng/mg creat   Dihydrocodeine                 116          EXPECTED   ng/mg creat   Norhydrocodone                 1331         EXPECTED   ng/mg creat    Sources of hydrocodone include scheduled prescription medications.    Dihydrocodeine and norhydrocodone are expected metabolites of    hydrocodone. Dihydrocodeine is also available as a scheduled    prescription medication.  ==================================================================== Test                      Result    Flag   Units      Ref Range   Creatinine              89               mg/dL      >=20 ==================================================================== Declared Medications:  The flagging and interpretation on this report are based on the  following declared medications.  Unexpected results may arise from  inaccuracies in the declared medications.   **Note: The testing scope of this panel includes these medications:   Amphetamine (Vyvanse)  Hydrocodone (Norco)  Lorazepam (Ativan)   **Note: The testing scope of this  panel does not include the  following reported medications:   Acetaminophen (Norco)  Albuterol (Ventolin HFA)  Aspirin  Atorvastatin (Lipitor)  Bupropion (Wellbutrin XL)  Cetirizine (Zyrtec)  Fluticasone (Flonase)  Ibuprofen (Advil)  Lamotrigine (Lamictal)  Metaxalone (Skelaxin)  Metformin (Glucophage)  Montelukast (Singulair)  Paroxetine  Spironolactone (Aldactone) ==================================================================== For clinical consultation, please call [phone]. ====================================================================      ROS  Constitutional: Denies any fever or chills Gastrointestinal: No reported hemesis, hematochezia, vomiting, or acute GI distress Musculoskeletal: Denies any acute onset joint swelling, redness, loss of ROM, or weakness Neurological: No reported episodes of acute onset apraxia, aphasia, dysarthria, agnosia, amnesia, paralysis, loss of coordination, or loss of consciousness  Medication Review  HYDROcodone-acetaminophen, LORazepam, PARoxetine, albuterol, aspirin, atorvastatin,  buPROPion, cetirizine, fluticasone, ibuprofen, lamoTRIgine, lisdexamfetamine, metFORMIN, metaxalone, montelukast, and spironolactone  History Review  Allergy: Ms. Leoni is allergic to oxycodone, penicillins, red dye, and sulfa antibiotics. Drug: Ms. Novosad  reports no history of drug use. Alcohol:  reports no history of alcohol use. Tobacco:  reports that she has never smoked. She has never used smokeless tobacco. Social: Ms. Julson  reports that she has never smoked. She has never used smokeless tobacco. She reports that she does not drink alcohol and does not use drugs. Medical:  has a past medical history of Anxiety, Asthma, Bulging lumbar disc (L3-4) (02/26/2015), Degenerative disc disease, Degenerative lumbar disc, Depression, Diabetes mellitus, Elective abortion, GERD (gastroesophageal reflux disease), Hypertension, Stroke ([place]) (06/11/2015), and  Vaginal delivery. Surgical: Ms. Friesz  has a past surgical history that includes Cholecystectomy (2001); Lapband (04/2012); Wisdom tooth extraction; Dilatation & curettage/hysteroscopy with myosure (N/A, 09/10/2014); mirena; and lap band surgery. Family: family history includes Asthma in her maternal grandmother; Diabetes in her father; Hypertension in her father and mother; Stroke in her maternal grandfather and maternal grandmother.  Laboratory Chemistry Profile   Renal Lab Results  Component Value Date   BUN 15 11/23/2019   CREATININE 0.92 62/13/0865   BCR NOT APPLICABLE 78/46/9629   GFR 58.78 (L) 02/08/2019   GFRAA >60 12/16/2017   GFRNONAA >60 12/16/2017     Hepatic Lab Results  Component Value Date   AST 13 11/23/2019   ALT 14 11/23/2019   ALBUMIN 4.4 02/08/2019   ALKPHOS 108 02/08/2019   HCVAB NEGATIVE 05/18/2013     Electrolytes Lab Results  Component Value Date   NA 140 11/23/2019   K 4.8 11/23/2019   CL 105 11/23/2019   CALCIUM 9.3 11/23/2019   PHOS 4.0 10/31/2015     Bone Lab Results  Component Value Date   VD25OH 31.95 02/08/2019     Inflammation (CRP: Acute Phase) (ESR: Chronic Phase) Lab Results  Component Value Date   ESRSEDRATE 32 05/26/2017       Note: Above Lab results reviewed.  Recent Imaging Review  VAS [name] LOWER EXTREMITY VENOUS (DVT) ([facility] and [facility] 7a-7p)  Lower Venous Study  Indications: Pain.   Performing Technologist: Maudry Mayhew MHA, RDMS, RVT, RDCS    Examination Guidelines: A complete evaluation includes B-mode imaging, spectral Doppler, color Doppler, and power Doppler as needed of all accessible portions of each vessel. Bilateral testing is considered an integral part of a complete examination. Limited examinations for reoccurring indications may be performed as noted.    Right Venous Findings: +---+---------------+---------+-----------+----------+-------+     CompressibilityPhasicitySpontaneityPropertiesSummary +---+---------------+---------+-----------+----------+-------+ CFVFull           Yes      Yes                          +---+---------------+---------+-----------+----------+-------+     Left Venous Findings: +---------+---------------+---------+-----------+----------+-------+          CompressibilityPhasicitySpontaneityPropertiesSummary +---------+---------------+---------+-----------+----------+-------+ CFV      Full           Yes      Yes                          +---------+---------------+---------+-----------+----------+-------+ SFJ      Full                                                 +---------+---------------+---------+-----------+----------+-------+  FV Prox  Full                                                 +---------+---------------+---------+-----------+----------+-------+ FV Mid   Full                                                 +---------+---------------+---------+-----------+----------+-------+ FV DistalFull                                                 +---------+---------------+---------+-----------+----------+-------+ PFV      Full                                                 +---------+---------------+---------+-----------+----------+-------+ POP      Full           Yes      Yes                          +---------+---------------+---------+-----------+----------+-------+ PTV      Full                                                 +---------+---------------+---------+-----------+----------+-------+ PERO     Full                                                 +---------+---------------+---------+-----------+----------+-------+          Summary: Right: No evidence of common femoral vein obstruction. Left: There is no evidence of deep vein thrombosis in the lower extremity. No cystic structure found in the  popliteal fossa.   *See table(s) above for measurements and observations.  Electronically signed by Monica Martinez MD on 12/16/2017 at 6:04:46 PM.      Final   Note: Reviewed        Physical Exam  General appearance: Well nourished, well developed, and well hydrated. In no apparent acute distress Mental status: Alert, oriented x 3 (person, place, & time)       Respiratory: No evidence of acute respiratory distress Eyes: PERLA Vitals: BP (!) 141/82   Pulse 88   Temp (!) 97.3 F (36.3 C) (Temporal)   Resp 18   Ht _0  (1.626 m)   Wt 235 lb 9.6 oz (106.9 kg)   SpO2 98%   BMI 40.44 kg/m  BMI: Estimated body mass index is 40.44 kg/m as calculated from the following:   Height as of this encounter: _1  (1.626 m).   Weight as of this encounter: 235 lb 9.6 oz (106.9 kg). Ideal: Ideal body weight: 54.7 kg (120 lb 9.5 oz) Adjusted ideal body weight: 75.6 kg (166 lb 9.5 oz)  Assessment   Status Diagnosis  Controlled Controlled Controlled 1. Chronic pain syndrome   2. Chronic low back pain (Primary Area of Pain) (Bilateral) (L>R)   3. Chronic lower extremity pain (Secondary area of Pain) (Left)   4. Chronic neck pain (Third area of Pain) (Bilateral) (L>R)   5. Pharmacologic therapy   6. Chronic use of opiate for therapeutic purpose      Updated Problems: No problems updated.  Plan of Care  Problem-specific:  No problem-specific Assessment & Plan notes found for this encounter.  Ms. JI FAIRBURN has a current medication list which includes the following long-term medication(s): albuterol, atorvastatin, fluticasone, [START ON 08/09/2020] hydrocodone-acetaminophen, [START ON 09/08/2020] hydrocodone-acetaminophen, [START ON 10/08/2020] hydrocodone-acetaminophen, metaxalone, metformin, montelukast, and spironolactone.  Pharmacotherapy (Medications Ordered): Meds ordered this encounter  Medications  . HYDROcodone-acetaminophen (NORCO/VICODIN) 5-325 MG tablet    Sig: Take  0.5-1 tablets by mouth 2 (two) times daily as needed for severe pain. Must last 30 days    Dispense:  60 tablet    Refill:  0    Not a duplicate. Do NOT delete! Dispense 1 day early if closed on refill date. Avoid benzodiazepines within 8 hours of opioids. Do not send refill requests.  Marland Kitchen HYDROcodone-acetaminophen (NORCO/VICODIN) 5-325 MG tablet    Sig: Take 0.5-1 tablets by mouth 2 (two) times daily as needed for severe pain. Must last 30 days    Dispense:  60 tablet    Refill:  0    Not a duplicate. Do NOT delete! Dispense 1 day early if closed on refill date. Avoid benzodiazepines within 8 hours of opioids. Do not send refill requests.  Marland Kitchen HYDROcodone-acetaminophen (NORCO/VICODIN) 5-325 MG tablet    Sig: Take 0.5-1 tablets by mouth 2 (two) times daily as needed for severe pain. Must last 30 days    Dispense:  60 tablet    Refill:  0    Not a duplicate. Do NOT delete! Dispense 1 day early if closed on refill date. Avoid benzodiazepines within 8 hours of opioids. Do not send refill requests.   Orders:  Orders Placed This Encounter  Procedures  . [employer]ox[employer] Select 13 (MW), Urine    Volume: 30 ml(s). Minimum 3 ml of urine is needed. Document temperature of fresh sample. Indications: Long term (current) use of opiate analgesic (D62.229)    Order Specific Question:   Release to patient    Answer:   Immediate   Follow-up plan:   Return in about 3 months (around 11/07/2020) for evaluation day (F2F) (MM).      Interventional management options: Planned, scheduled, and/or pending:      Considering:   Diagnostic bilateral lumbar facet block  Possible bilateral lumbar facet RFA.  Diagnostic left L3-4 LESI  Diagnostic left L3 TFESI  Diagnostic left L4 TFESI  Diagnostic bilateral L5 TFESI  Diagnostic left CESI  Diagnostic bilateral cervical facet block  Possible bilateral cervical facet RFA.    Palliative PRN treatment(s):   None at this time      Recent Visits Date Type Provider Dept   05/05/20 Office Visit Milinda Pointer, MD [facility]  Showing recent visits within past 90 days and meeting all other requirements Today's Visits Date Type Provider Dept  07/28/20 Office Visit Milinda Pointer, MD [facility]  Showing today's visits and meeting all other requirements Future Appointments No visits were found meeting these conditions. Showing future appointments within next 90 days and meeting all other requirements  I discussed the assessment  and treatment plan with the patient. The patient was provided an opportunity to ask questions and all were answered. The patient agreed with the plan and demonstrated an understanding of the instructions.  Patient advised to call back or seek an in-person evaluation if the symptoms or condition worsens.  Duration of encounter: 30 minutes.  Note by: Gaspar Cola, MD Date: 07/28/2020; Time: 12:17 PM

## 2020-07-28 ENCOUNTER — Other Ambulatory Visit: Payer: Self-pay

## 2020-07-28 ENCOUNTER — Ambulatory Visit: Payer: Managed Care, Other (non HMO) | Attending: Pain Medicine | Admitting: Pain Medicine

## 2020-07-28 ENCOUNTER — Encounter: Payer: Self-pay | Admitting: Pain Medicine

## 2020-07-28 VITALS — BP 141/82 | HR 88 | Temp 97.3°F | Resp 18 | Ht 64.0 in | Wt 235.6 lb

## 2020-07-28 DIAGNOSIS — G894 Chronic pain syndrome: Secondary | ICD-10-CM

## 2020-07-28 DIAGNOSIS — M79605 Pain in left leg: Secondary | ICD-10-CM | POA: Insufficient documentation

## 2020-07-28 DIAGNOSIS — M542 Cervicalgia: Secondary | ICD-10-CM | POA: Diagnosis not present

## 2020-07-28 DIAGNOSIS — Z79891 Long term (current) use of opiate analgesic: Secondary | ICD-10-CM | POA: Insufficient documentation

## 2020-07-28 DIAGNOSIS — G8929 Other chronic pain: Secondary | ICD-10-CM | POA: Insufficient documentation

## 2020-07-28 DIAGNOSIS — M545 Low back pain, unspecified: Secondary | ICD-10-CM | POA: Insufficient documentation

## 2020-07-28 DIAGNOSIS — Z79899 Other long term (current) drug therapy: Secondary | ICD-10-CM | POA: Diagnosis present

## 2020-07-28 MED ORDER — HYDROCODONE-ACETAMINOPHEN 5-325 MG PO TABS: 0.5000 | ORAL_TABLET | Freq: Two times a day (BID) | ORAL | 0 refills | Status: DC | PRN

## 2020-07-28 NOTE — Progress Notes (Signed)
Nursing Pain Medication Assessment:  Safety precautions to be maintained throughout the outpatient stay will include: orient to surroundings, keep bed in low position, maintain call bell within reach at all times, provide assistance with transfer out of bed and ambulation.  Medication Inspection Compliance: Pill count conducted under aseptic conditions, in front of the patient. Neither the pills nor the bottle was removed from the patient's sight at any time. Once count was completed pills were immediately returned to the patient in their original bottle.  Medication: Hydrocodone/APAP Pill/Patch Count: 23.5 of 60 pills remain Pill/Patch Appearance: Markings consistent with prescribed medication Bottle Appearance: Standard pharmacy container. Clearly labeled. Filled Date: 05 / 19 / 2022 Last Medication intake:  Today

## 2020-07-28 NOTE — Patient Instructions (Signed)
____________________________________________________________________________________________  Medication Rules  Purpose: To inform patients, and their family members, of our rules and regulations.  Applies to: All patients receiving prescriptions (written or electronic).  Pharmacy of record: Pharmacy where electronic prescriptions will be sent. If written prescriptions are taken to a different pharmacy, please inform the nursing staff. The pharmacy listed in the electronic medical record should be the one where you would like electronic prescriptions to be sent.  Electronic prescriptions: In compliance with the [place] Strengthen Opioid Misuse Prevention (STOP) Act of 2017 (Session Law 2017-74/H243), effective February 22, 2018, all controlled substances must be electronically prescribed. Calling prescriptions to the pharmacy will cease to exist.  Prescription refills: Only during scheduled appointments. Applies to all prescriptions.  NOTE: The following applies primarily to controlled substances (Opioid* Pain Medications).   Type of encounter (visit): For patients receiving controlled substances, face-to-face visits are required. (Not an option or up to the patient.)  Patient's responsibilities: 1. Pain Pills: Bring all pain pills to every appointment (except for procedure appointments). 2. Pill Bottles: Bring pills in original pharmacy bottle. Always bring the newest bottle. Bring bottle, even if empty. 3. Medication refills: You are responsible for knowing and keeping track of what medications you take and those you need refilled. The day before your appointment: write a list of all prescriptions that need to be refilled. The day of the appointment: give the list to the admitting nurse. Prescriptions will be written only during appointments. No prescriptions will be written on procedure days. If you forget a medication: it will not be "Called in", "Faxed", or "electronically sent".  You will need to get another appointment to get these prescribed. No early refills. Do not call asking to have your prescription filled early. 4. Prescription Accuracy: You are responsible for carefully inspecting your prescriptions before leaving our office. Have the discharge nurse carefully go over each prescription with you, before taking them home. Make sure that your name is accurately spelled, that your address is correct. Check the name and dose of your medication to make sure it is accurate. Check the number of pills, and the written instructions to make sure they are clear and accurate. Make sure that you are given enough medication to last until your next medication refill appointment. 5. Taking Medication: Take medication as prescribed. When it comes to controlled substances, taking less pills or less frequently than prescribed is permitted and encouraged. Never take more pills than instructed. Never take medication more frequently than prescribed.  6. Inform other Doctors: Always inform, all of your healthcare providers, of all the medications you take. 7. Pain Medication from other Providers: You are not allowed to accept any additional pain medication from any other Doctor or Healthcare provider. There are two exceptions to this rule. (see below) In the event that you require additional pain medication, you are responsible for notifying us, as stated below. 8. Cough Medicine: Often these contain an opioid, such as codeine or hydrocodone. Never accept or take cough medicine containing these opioids if you are already taking an opioid* medication. The combination may cause respiratory failure and death. 9. Medication Agreement: You are responsible for carefully reading and following our Medication Agreement. This must be signed before receiving any prescriptions from our practice. Safely store a copy of your signed Agreement. Violations to the Agreement will result in no further prescriptions.  (Additional copies of our Medication Agreement are available upon request.) 10. Laws, Rules, & Regulations: All patients are expected to follow all   Federal and State Laws, Statutes, Rules, & Regulations. Ignorance of the Laws does not constitute a valid excuse.  11. Illegal drugs and Controlled Substances: The use of illegal substances (including, but not limited to marijuana and its derivatives) and/or the illegal use of any controlled substances is strictly prohibited. Violation of this rule may result in the immediate and permanent discontinuation of any and all prescriptions being written by our practice. The use of any illegal substances is prohibited. 12. Adopted CDC guidelines & recommendations: Target dosing levels will be at or below 60 MME/day. Use of benzodiazepines** is not recommended.  Exceptions: There are only two exceptions to the rule of not receiving pain medications from other Healthcare Providers. 1. Exception #1 (Emergencies): In the event of an emergency (i.e.: accident requiring emergency care), you are allowed to receive additional pain medication. However, you are responsible for: As soon as you are able, call our office [phone], at any time of the day or night, and leave a message stating your name, the date and nature of the emergency, and the name and dose of the medication prescribed. In the event that your call is answered by a member of our staff, make sure to document and save the date, time, and the name of the person that took your information.  2. Exception #2 (Planned Surgery): In the event that you are scheduled by another doctor or dentist to have any type of surgery or procedure, you are allowed (for a period no longer than 30 days), to receive additional pain medication, for the acute post-op pain. However, in this case, you are responsible for picking up a copy of our "Post-op Pain Management for Surgeons" handout, and giving it to your surgeon or dentist. This  document is available at our office, and does not require an appointment to obtain it. Simply go to our office during business hours (Monday-Thursday from 8:00 AM to 4:00 PM) (Friday 8:00 AM to 12:00 Noon) or if you have a scheduled appointment with us, prior to your surgery, and ask for it by name. In addition, you are responsible for: calling our office [phone], at any time of the day or night, and leaving a message stating your name, name of your surgeon, type of surgery, and date of procedure or surgery. Failure to comply with your responsibilities may result in termination of therapy involving the controlled substances.  *Opioid medications include: morphine, codeine, oxycodone, oxymorphone, hydrocodone, hydromorphone, meperidine, tramadol, tapentadol, buprenorphine, fentanyl, methadone. **Benzodiazepine medications include: diazepam (Valium), alprazolam (Xanax), clonazepam (Klonopine), lorazepam (Ativan), clorazepate (Tranxene), chlordiazepoxide (Librium), estazolam (Prosom), oxazepam (Serax), temazepam (Restoril), triazolam (Halcion) (Last updated: 01/21/2020) ____________________________________________________________________________________________   ____________________________________________________________________________________________  Medication Recommendations and Reminders  Applies to: All patients receiving prescriptions (written and/or electronic).  Medication Rules & Regulations: These rules and regulations exist for your safety and that of others. They are not flexible and neither are we. Dismissing or ignoring them will be considered "non-compliance" with medication therapy, resulting in complete and irreversible termination of such therapy. (See document titled "Medication Rules" for more details.) In all conscience, because of safety reasons, we cannot continue providing a therapy where the patient does not follow instructions.  Pharmacy of record:   Definition:  This is the pharmacy where your electronic prescriptions will be sent.   We do not endorse any particular pharmacy, however, we have experienced problems with [facility] not securing enough medication supply for the community.  We do not restrict you in your choice of pharmacy. However,   once we write for your prescriptions, we will NOT be re-sending more prescriptions to fix restricted supply problems created by your pharmacy, or your insurance.   The pharmacy listed in the electronic medical record should be the one where you want electronic prescriptions to be sent.  If you choose to change pharmacy, simply notify our nursing staff.  Recommendations:  Keep all of your pain medications in a safe place, under lock and key, even if you live alone. We will NOT replace lost, stolen, or damaged medication.  After you fill your prescription, take 1 week's worth of pills and put them away in a safe place. You should keep a separate, properly labeled bottle for this purpose. The remainder should be kept in the original bottle. Use this as your primary supply, until it runs out. Once it's gone, then you know that you have 1 week's worth of medicine, and it is time to come in for a prescription refill. If you do this correctly, it is unlikely that you will ever run out of medicine.  To make sure that the above recommendation works, it is very important that you make sure your medication refill appointments are scheduled at least 1 week before you run out of medicine. To do this in an effective manner, make sure that you do not leave the office without scheduling your next medication management appointment. Always ask the nursing staff to show you in your prescription , when your medication will be running out. Then arrange for the receptionist to get you a return appointment, at least 7 days before you run out of medicine. Do not wait until you have 1 or 2 pills left, to come in. This is very poor planning and  does not take into consideration that we may need to cancel appointments due to bad weather, sickness, or emergencies affecting our staff.  DO NOT ACCEPT A "Partial Fill": If for any reason your pharmacy does not have enough pills/tablets to completely fill or refill your prescription, do not allow for a "partial fill". The law allows the pharmacy to complete that prescription within 72 hours, without requiring a new prescription. If they do not fill the rest of your prescription within those 72 hours, you will need a separate prescription to fill the remaining amount, which we will NOT provide. If the reason for the partial fill is your insurance, you will need to talk to the pharmacist about payment alternatives for the remaining tablets, but again, DO NOT ACCEPT A PARTIAL FILL, unless you can trust your pharmacist to obtain the remainder of the pills within 72 hours.  Prescription refills and/or changes in medication(s):   Prescription refills, and/or changes in dose or medication, will be conducted only during scheduled medication management appointments. (Applies to both, written and electronic prescriptions.)  No refills on procedure days. No medication will be changed or started on procedure days. No changes, adjustments, and/or refills will be conducted on a procedure day. Doing so will interfere with the diagnostic portion of the procedure.  No phone refills. No medications will be "called into the pharmacy".  No Fax refills.  No weekend refills.  No Holliday refills.  No after hours refills.  Remember:  Business hours are:  Monday to Thursday 8:00 AM to 4:00 PM Provider's Schedule: Bobbette Eakes, MD - Appointments are:  Medication management: Monday and Wednesday 8:00 AM to 4:00 PM Procedure day: Tuesday and Thursday 7:30 AM to 4:00 PM Bilal Lateef, MD - Appointments are:    Medication management: Tuesday and Thursday 8:00 AM to 4:00 PM Procedure day: Monday and Wednesday  7:30 AM to 4:00 PM (Last update: 09/12/2019) ____________________________________________________________________________________________   ____________________________________________________________________________________________  CBD (cannabidiol) WARNING  Applicable to: All individuals currently taking or considering taking CBD (cannabidiol) and, more important, all patients taking opioid analgesic controlled substances (pain medication). (Example: oxycodone; oxymorphone; hydrocodone; hydromorphone; morphine; methadone; tramadol; tapentadol; fentanyl; buprenorphine; butorphanol; dextromethorphan; meperidine; codeine; etc.)  Legal status: CBD remains a Schedule I drug prohibited for any use. CBD is illegal with one exception. In the [place], CBD has a limited Food and Drug Administration (FDA) approval for the treatment of two specific types of epilepsy disorders. Only one CBD product has been approved by the FDA for this purpose: "Epidiolex". FDA is aware that some companies are marketing products containing cannabis and cannabis-derived compounds in ways that violate the Federal Food, Drug and Cosmetic Act (FD&C Act) and that may put the health and safety of consumers at risk. The FDA, a Federal agency, has not enforced the CBD status since 2018.   Legality: Some manufacturers ship CBD products nationally, which is illegal. Often such products are sold online and are therefore available throughout the country. CBD is openly sold in head shops and health food stores in some states where such sales have not been explicitly legalized. Selling unapproved products with unsubstantiated therapeutic claims is not only a violation of the law, but also can put patients at risk, as these products have not been proven to be safe or effective. Federal illegality makes it difficult to conduct research on CBD.  Reference: "FDA Regulation of Cannabis and Cannabis-Derived Products, Including Cannabidiol  (CBD)" - https://www.fda.gov/news-events/public-health-focus/fda-regulation-cannabis-and-cannabis-derived-products-including-cannabidiol-cbd  Warning: CBD is not FDA approved and has not undergo the same manufacturing controls as prescription drugs.  This means that the purity and safety of available CBD may be questionable. Most of the time, despite manufacturer's claims, it is contaminated with THC (delta-9-tetrahydrocannabinol - the chemical in marijuana responsible for the "HIGH").  When this is the case, the THC contaminant will trigger a positive urine drug screen (UDS) test for Marijuana (carboxy-THC). Because a positive UDS for any illicit substance is a violation of our medication agreement, your opioid analgesics (pain medicine) may be permanently discontinued.  MORE ABOUT CBD  General Information: CBD  is a derivative of the Marijuana (cannabis sativa) plant discovered in 1940. It is one of the 113 identified substances found in Marijuana. It accounts for up to 40% of the plant's extract. As of 2018, preliminary clinical studies on CBD included research for the treatment of anxiety, movement disorders, and pain. CBD is available and consumed in multiple forms, including inhalation of smoke or vapor, as an aerosol spray, and by mouth. It may be supplied as an oil containing CBD, capsules, dried cannabis, or as a liquid solution. CBD is thought not to be as psychoactive as THC (delta-9-tetrahydrocannabinol - the chemical in marijuana responsible for the "HIGH"). Studies suggest that CBD may interact with different biological target receptors in the body, including cannabinoid and other neurotransmitter receptors. As of 2018 the mechanism of action for its biological effects has not been determined.  Side-effects  Adverse reactions: Dry mouth, diarrhea, decreased appetite, fatigue, drowsiness, malaise, weakness, sleep disturbances, and others.  Drug interactions: CBC may interact with other  medications such as blood-thinners. (Last update: 09/29/2019) ____________________________________________________________________________________________   ____________________________________________________________________________________________  Drug Holidays (Slow)  What is a "Drug Holiday"? Drug Holiday: is the name given to the period of time during   which a patient stops taking a medication(s) for the purpose of eliminating tolerance to the drug.  Benefits . Improved effectiveness of opioids. . Decreased opioid dose needed to achieve benefits. . Improved pain with lesser dose.  What is tolerance? Tolerance: is the progressive decreased in effectiveness of a drug due to its repetitive use. With repetitive use, the body gets use to the medication and as a consequence, it loses its effectiveness. This is a common problem seen with opioid pain medications. As a result, a larger dose of the drug is needed to achieve the same effect that used to be obtained with a smaller dose.  How long should a "Drug Holiday" last? You should stay off of the pain medicine for at least 14 consecutive days. (2 weeks)  Should I stop the medicine "cold turkey"? No. You should always coordinate with your Pain Specialist so that he/she can provide you with the correct medication dose to make the transition as smoothly as possible.  How do I stop the medicine? Slowly. You will be instructed to decrease the daily amount of pills that you take by one (1) pill every seven (7) days. This is called a "slow downward taper" of your dose. For example: if you normally take four (4) pills per day, you will be asked to drop this dose to three (3) pills per day for seven (7) days, then to two (2) pills per day for seven (7) days, then to one (1) per day for seven (7) days, and at the end of those last seven (7) days, this is when the "Drug Holiday" would start.   Will I have withdrawals? By doing a "slow downward  taper" like this one, it is unlikely that you will experience any significant withdrawal symptoms. Typically, what triggers withdrawals is the sudden stop of a high dose opioid therapy. Withdrawals can usually be avoided by slowly decreasing the dose over a prolonged period of time. If you do not follow these instructions and decide to stop your medication abruptly, withdrawals may be possible.  What are withdrawals? Withdrawals: refers to the wide range of symptoms that occur after stopping or dramatically reducing opiate drugs after heavy and prolonged use. Withdrawal symptoms do not occur to patients that use low dose opioids, or those who take the medication sporadically. Contrary to benzodiazepine (example: Valium, Xanax, etc.) or alcohol withdrawals ("Delirium Tremens"), opioid withdrawals are not lethal. Withdrawals are the physical manifestation of the body getting rid of the excess receptors.  Expected Symptoms Early symptoms of withdrawal may include: . Agitation . Anxiety . Muscle aches . Increased tearing . Insomnia . Runny nose . Sweating . Yawning  Late symptoms of withdrawal may include: . Abdominal cramping . Diarrhea . Dilated pupils . Goose bumps . Nausea . Vomiting  Will I experience withdrawals? Due to the slow nature of the taper, it is very unlikely that you will experience any.  What is a slow taper? Taper: refers to the gradual decrease in dose.  (Last update: 09/12/2019) ____________________________________________________________________________________________     

## 2020-08-02 LAB — TOXASSURE SELECT 13 (MW), URINE
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
## 2020-08-21 ENCOUNTER — Ambulatory Visit (INDEPENDENT_AMBULATORY_CARE_PROVIDER_SITE_OTHER): Payer: Managed Care, Other (non HMO) | Admitting: Obstetrics and Gynecology

## 2020-08-21 ENCOUNTER — Encounter: Payer: Self-pay | Admitting: Obstetrics and Gynecology

## 2020-08-21 ENCOUNTER — Other Ambulatory Visit: Payer: Self-pay

## 2020-08-21 VITALS — BP 148/84 | HR 103 | Ht 63.5 in | Wt 234.0 lb

## 2020-08-21 DIAGNOSIS — K649 Unspecified hemorrhoids: Secondary | ICD-10-CM | POA: Diagnosis not present

## 2020-08-21 DIAGNOSIS — Z1211 Encounter for screening for malignant neoplasm of colon: Secondary | ICD-10-CM | POA: Diagnosis not present

## 2020-08-21 DIAGNOSIS — N951 Menopausal and female climacteric states: Secondary | ICD-10-CM

## 2020-08-21 DIAGNOSIS — Z01419 Encounter for gynecological examination (general) (routine) without abnormal findings: Secondary | ICD-10-CM | POA: Diagnosis not present

## 2020-08-21 NOTE — Patient Instructions (Signed)

## 2020-08-22 LAB — FOLLICLE STIMULATING HORMONE: FSH: 66.1 m[IU]/mL

## 2020-08-22 LAB — ESTRADIOL: Estradiol: <15 pg/mL

## 2020-08-24 ENCOUNTER — Telehealth (INDEPENDENT_AMBULATORY_CARE_PROVIDER_SITE_OTHER): Payer: Managed Care, Other (non HMO) | Admitting: Family Medicine

## 2020-08-24 DIAGNOSIS — U071 COVID-19: Secondary | ICD-10-CM

## 2020-08-24 MED ORDER — MOLNUPIRAVIR EUA 200MG CAPSULE: 4.0000 | ORAL_CAPSULE | Freq: Two times a day (BID) | ORAL | 0 refills | Status: AC

## 2020-08-24 NOTE — Progress Notes (Signed)
[facility] [address] [place] [address] Dept: [phone] Dept Fax: [phone]  Telephone Visit  I connected with [name] on 08/24/20 at  by telephone and verified that I am speaking with the correct person using two identifiers.  Location patient: Home Location provider: Clinic Persons participating in the virtual visit: Patient, Provider  I discussed the limitations of evaluation and management by telemedicine and the availability of in person appointments. The patient expressed understanding and agreed to proceed.  Chief Complaint  Patient presents with   Covid Positive   SUBJECTIVE:  HPI: Debra Myers is a 55 y.o. female who spoke with the nurse triage line about recent upper respiratory symptoms and has had a positive COVID test. MS. Moman notes that she began feeling sick on Friday, having had nasal congestion, sore throat, poor sleep and itchy ears. This worsened for her on Saturday. Later int he day, she completed a home COVID test which was positive. She has a history of asthma and has been using her inhaler. She does not feel she is wheezing more at this point. She no longer has a peak flow meter at home. She has not been running a fever.  Past Medical History:  Diagnosis Date   Anxiety    Asthma    ALLERGY INDUCED   Bulging lumbar disc (L3-4) 02/26/2015   Degenerative disc disease    Degenerative lumbar disc    Depression    Diabetes mellitus    TYPE 2   Elective abortion    ONE   GERD (gastroesophageal reflux disease)    takes prilosec    Hypertension    Stroke (HCC) 06/11/2015   patient describes as mini stroke   Vaginal delivery    ONE NSVD   Past Surgical History:  Procedure Laterality Date   CHOLECYSTECTOMY  2001   DILATATION & CURETTAGE/HYSTEROSCOPY WITH MYOSURE N/A 09/10/2014   Procedure: DILATATION & CURETTAGE/HYSTEROSCOPY WITH MYOSURE/INSERTION OF IUD;  Surgeon: Ok Edwards,  MD;  Location: [facility];  Service: Gynecology;  Laterality: N/A;   lap band surgery     Lapband  04/2012   mirena     inserted 09-2014   WISDOM TOOTH EXTRACTION     Family History  Problem Relation Age of Onset   Hypertension Father    Diabetes Father    Hypertension Mother    Asthma Maternal Grandmother    Stroke Maternal Grandmother    Stroke Maternal Grandfather    Social History   Tobacco Use   Smoking status: Never   Smokeless tobacco: Never  Vaping Use   Vaping Use: Never used  Substance Use Topics   Alcohol use: Never    Alcohol/week: 0.0 standard drinks   Drug use: No    Current Outpatient Medications:    albuterol (VENTOLIN HFA) 108 (90 Base) MCG/ACT inhaler, INHALE 2 PUFFS BY MOUTH INTO THE LUNGS EVERY 6 HOURS AS NEEDED FOR WHEEZING OR SHORTNESS OF BREATH, Disp: 18 g, Rfl: 1   atorvastatin (LIPITOR) 10 MG tablet, TAKE 1 TABLET(10 MG) BY MOUTH DAILY, Disp: 90 tablet, Rfl: 2   buPROPion (WELLBUTRIN XL) 300 MG 24 hr tablet, Take 300 mg by mouth daily., Disp: , Rfl:    cetirizine (ZYRTEC) 10 MG tablet, Take 10 mg by mouth at bedtime., Disp: , Rfl:    fluticasone (FLONASE) 50 MCG/ACT nasal spray, SHAKE LIQUID AND USE 1 SPRAY IN EACH NOSTRIL TWICE DAILY AS NEEDED FOR ALLERGIES OR RHINITIS,  Disp: 16 g, Rfl: 3   HYDROcodone-acetaminophen (NORCO/VICODIN) 5-325 MG tablet, Take 0.5-1 tablets by mouth 2 (two) times daily as needed for severe pain. Must last 30 days, Disp: 60 tablet, Rfl: 0   [START ON 09/08/2020] HYDROcodone-acetaminophen (NORCO/VICODIN) 5-325 MG tablet, Take 0.5-1 tablets by mouth 2 (two) times daily as needed for severe pain. Must last 30 days, Disp: 60 tablet, Rfl: 0   [START ON 10/08/2020] HYDROcodone-acetaminophen (NORCO/VICODIN) 5-325 MG tablet, Take 0.5-1 tablets by mouth 2 (two) times daily as needed for severe pain. Must last 30 days, Disp: 60 tablet, Rfl: 0   ibuprofen (ADVIL) 200 MG tablet, Take 400 mg by mouth every 8 (eight) hours as needed., Disp: , Rfl:     lamoTRIgine (LAMICTAL) 200 MG tablet, Take 300 mg by mouth at bedtime. , Disp: , Rfl:    LORazepam (ATIVAN) 1 MG tablet, Take 0.5-1 mg by mouth every 8 (eight) hours as needed for anxiety or sleep. , Disp: , Rfl:    metaxalone (SKELAXIN) 800 MG tablet, Take 1 tablet (800 mg total) by mouth 3 (three) times daily as needed for muscle spasms. Max: 3/day, Disp: 270 tablet, Rfl: 0   metFORMIN (GLUCOPHAGE) 500 MG tablet, TAKE 1 TABLET BY MOUTH EVERY DAY WITH BREAKFAST, Disp: 90 tablet, Rfl: 3   montelukast (SINGULAIR) 10 MG tablet, TAKE 1 TABLET(10 MG) BY MOUTH AT BEDTIME, Disp: 90 tablet, Rfl: 2   PARoxetine (PAXIL-CR) 25 MG 24 hr tablet, Take 50 mg by mouth at bedtime. , Disp: , Rfl:    RA ASPIRIN EC ADULT LOW ST 81 MG EC tablet, take 1 tablet by mouth once daily, Disp: 30 tablet, Rfl: 0   spironolactone (ALDACTONE) 25 MG tablet, TAKE 1 TABLET(25 MG) BY MOUTH DAILY, Disp: 90 tablet, Rfl: 1   VYVANSE 40 MG capsule, Take 40 mg by mouth daily., Disp: , Rfl: 0  Allergies  Allergen Reactions   Oxycodone Itching   Penicillins Other (See Comments)    Reaction:  GI upset  Has patient had a PCN reaction causing immediate rash, facial/tongue/throat swelling, SOB or lightheadedness with hypotension: No Has patient had a PCN reaction causing severe rash involving mucus membranes or skin necrosis: No Has patient had a PCN reaction that required hospitalization No Has patient had a PCN reaction occurring within the last 10 years: No If all of the above answers are "NO", then may proceed with Cephalosporin use.   Red Dye     Numb tongue   Sulfa Antibiotics Itching and Rash    ROS: See pertinent positives and negatives per HPI.  OBSERVATIONS/OBJECTIVE:  VITALS per patient if applicable:  There were no vitals filed for this visit.   ASSESSMENT AND PLAN: 1. COVID-19 Reviewed home care instructions for COVID. Advised self-isolation at home for at least 5 days. After 5 days, if improved and fever  resolved, can be in public, but should wear a mask around others for an additional 5 days. If symptoms, esp, dyspnea develops/worsens, recommend in-person evaluation at either an urgent care or the emergency room. As she is at moderate risk for COVID complications, I will treat her with an antiviral. Her last renal function test was in Oct. 2021, so will select molnupiravir.  - molnupiravir EUA 200 mg CAPS; Take 4 capsules (800 mg total) by mouth 2 (two) times daily for 5 days.  Dispense: 40 capsule; Refill: 0  I discussed the assessment and treatment plan with the patient. The patient was provided an opportunity to ask  questions and all were answered. The patient agreed with the plan and demonstrated an understanding of the instructions.   The patient was advised to call back or seek an in-person evaluation if the symptoms worsen or if the condition fails to improve as anticipated.  I spent 25 minutes on this telephone encounter.  Loyola Mast, MD

## 2020-08-26 ENCOUNTER — Telehealth: Payer: Self-pay

## 2020-08-26 NOTE — Telephone Encounter (Signed)
Noted, notes reviewed.  

## 2020-08-26 NOTE — Telephone Encounter (Signed)
[facility] Night - Client TELEPHONE ADVICE RECORD [facility] Patient Name: Debra Myers PP Gender: Female DOB: 06/26/1965 Age: 55 Y 1 M 3 D Return Phone Number: [phone] (Primary), [phone] (Secondary) Address: [place]  [address] Client [facility] Night - Client Client Site [facility] - Night Physician Vernona Rieger - NP Contact Type Call Who Is Calling Patient / Member / Family / Caregiver Call Type Triage / Clinical Relationship To Patient Self Return Phone Number [phone] (Primary) Chief Complaint Sore Throat Reason for Call Symptomatic / Request for Health Information Initial Comment Caller has sore throat, itchy ears, stuffy nose, loss of appetite. Caller is asthma pt. Caller did at home covid test and it was pos. CBWN Translation No Nurse Assessment Nurse: Lawerance Bach, RN, Synetta Fail Date/Time (Eastern Time): 08/24/2020 4:29:57 AM Confirm and document reason for call. If symptomatic, describe symptoms. ---Caller states she tested COVID+ on a home test Saturday, has sore throat, stuffy nose, itchy ears and decreased appetite. Does the patient have any new or worsening symptoms? ---Yes Will a triage be completed? ---Yes Related visit to physician within the last 2 weeks? ---Yes Does the PT have any chronic conditions? (i.e. diabetes, asthma, this includes High risk factors for pregnancy, etc.) ---Yes List chronic conditions. ---Asthma Chronic back pain Is the patient pregnant or possibly pregnant? (Ask all females between the ages of 10-55) ---No Is this a behavioral health or substance abuse call? ---No Nurse: Lawerance Bach, RN, Synetta Fail Date/Time (Eastern Time): 08/24/2020 4:39:16 AM Please select the assessment type ---Verbal order / New medication order Does the client directives allow for assistance with medications after hours? ---Yes Other current medications?  ---Unknown Medication allergies? ---Yes List medication allergies. ---Sulfa Oxycodone PCN PLEASE NOTE: All timestamps contained within this report are represented as [place] Standard Time. CONFIDENTIALTY NOTICE: This fax transmission is intended only for the addressee. It contains information that is legally privileged, confidential or otherwise protected from use or disclosure. If you are not the intended recipient, you are strictly prohibited from reviewing, disclosing, copying using or disseminating any of this information or taking any action in reliance on or regarding this information. If you have received this fax in error, please notify us immediately by telephone so that we can arrange for its return to [name]. Phone: [phone], Toll-Free: [phone], Fax: [phone] Page: 2 of 3 Call Id: [id number] Nurse Assessment Pharmacy name and phone number. ---[facility] on [address] and [place] [phone] Does the client directive allow for RN to call in the medication order to the pharmacy? ---Yes Guidelines Guideline Title Affirmed Question Affirmed Notes Nurse Date/Time Lamount Cohen Time) COVID-19 - Diagnosed or Suspected [1] HIGH RISK for severe COVID complications (e.g., weak immune system, age > 64 years, obesity with BMI > 25, pregnant, chronic lung disease or other chronic medical condition) AND [2] COVID symptoms (e.g., cough, fever) (Exceptions: Already seen by PCP and no new or worsening symptoms.) Lawerance Bach, RN, Synetta Fail 08/24/2020 4:32:53 AM Disp. Time Lamount Cohen Time) Disposition Final User 08/23/2020 10:54:22 PM Send To Call Back Waiting For Nurse Rowe Clack 08/23/2020 11:18:26 PM Send To Clinical Follow Up Servando Salina, RN, Greg 08/24/2020 4:46:09 AM Paged On Call back to [facility], RN, Synetta Fail 08/24/2020 5:02:45 AM Paged On Call back to [facility], RN, Synetta Fail 08/24/2020 5:43:31 AM Paged On Call back to [facility], RN, Synetta Fail 08/24/2020  4:38:22 AM Call PCP Now Yes Lawerance Bach, RN, Rudell Cobb Disagree/Comply  Comply Caller Understands Yes PreDisposition Did not know what to do Care Advice Given Per Guideline CALL PCP NOW: * You need to discuss this with your doctor (or NP/PA). * I'll page the on-call provider now. If you haven't heard from the provider (or me) within 30 minutes, call again. GENERAL CARE ADVICE FOR COVID-19 SYMPTOMS: * The symptoms are generally treated the same whether you have COVID-19, influenza or some other respiratory virus. * Cough: Use cough drops. * Feeling dehydrated: Drink extra liquids. If the air in your home is dry, use a humidifier. * Sore throat: Try PLEASE NOTE: All timestamps contained within this report are represented as [place] Standard Time. CONFIDENTIALTY NOTICE: This fax transmission is intended only for the addressee. It contains information that is legally privileged, confidential or otherwise protected from use or disclosure. If you are not the intended recipient, you are strictly prohibited from reviewing, disclosing, copying using or disseminating any of this information or taking any action in reliance on or regarding this information. If you have received this fax in error, please notify us immediately by telephone so that we can arrange for its return to [name]. Phone: [phone], Toll-Free: [phone], Fax: [phone] Page: 3 of 3 Call Id: [id number] Care Advice Given Per Guideline throat lozenges, hard candy or warm chicken broth. COUGH MEDICINES: * HOME REMEDY - HONEY: This old home remedy has been shown to help decrease coughing at night. The adult dosage is 2 teaspoons (10 ml) at bedtime. COVID-19 - HOW TO PROTECT OTHERS - WHEN YOU ARE SICK WITH COVID-19: * STAY HOME A MINIMUM OF 5 DAYS: Home isolation is needed for at least 5 days after the symptoms started. Stay home from school or work if you are sick. Do NOT go to religious services, child care centers, shopping, or other  public places. Do NOT use public transportation (e.g., bus, taxis, ride-sharing). Do NOT allow any visitors to your home. Leave the house only if you need to seek urgent medical care. * WEAR A MASK FOR 10 DAYS: Wear a well-fitted mask for 10 full days any time you are around others inside your home or in public. Do not go to places where you are unable to wear a mask. * WASH HANDS OFTEN: Wash hands often with soap and water. After coughing or sneezing are important times. If soap and water are not available, use an alcohol-based hand sanitizer with at least 60% alcohol, covering all surfaces of your hands and rubbing them together until they feel dry. Avoid touching your eyes, nose, and mouth with unwashed hands. CALL BACK IF: * You become worse CARE ADVICE given per COVID-19 - DIAGNOSED OR SUSPECTED (Adult) guideline. Comments User: Theodosia Quay, RN Date/Time Lamount Cohen Time): 08/24/2020 6:08:49 AM Called and explained to caller that I was unable to reach the on call provider and Upgraded her care advise to go to ER or Urgent care now outcome. User: Fayette Pho, RN Date/Time Lamount Cohen Time): 08/24/2020 8:10:[address]. Veto Kemps called back. Nurse reviewed record with MD Referrals GO TO FACILITY UNDECIDED Paging DoctorName Phone DateTime Result/ Outcome Message Type Notes Macario Golds MD [phone] 08/24/2020 4:46:09 AM Called On Call Provider - Left Message Doctor Paged Herbie Drape "Marvis Moeller MD [phone] 08/24/2020 5:02:45 AM Called On Call Provider - Left Message Doctor Paged Herbie Drape "Marvis Moeller MD [phone] 08/24/2020 5:43:31 AM Called On Call Provider - Left Message Doctor Paged Herbie Drape "Marvis Moeller MD 08/24/2020 6:05:33 AM Unable to Reach on call - Max Attempts

## 2020-09-09 ENCOUNTER — Other Ambulatory Visit: Payer: Self-pay | Admitting: Primary Care

## 2020-09-09 DIAGNOSIS — E781 Pure hyperglyceridemia: Secondary | ICD-10-CM

## 2020-10-14 NOTE — Progress Notes (Signed)
PROVIDER NOTE: Information contained herein reflects review and annotations entered in association with encounter. Interpretation of such information and data should be left to medically-trained personnel. Information provided to patient can be located elsewhere in the medical record under "Patient Instructions". Document created using STT-dictation technology, any transcriptional errors that may result from process are unintentional.    Patient: Debra Myers  Service Category: E/M  Provider: Gaspar Cola, MD  DOB: 10-Mar-1965  DOS: 10/15/2020  Specialty: Interventional Pain Management  MRN: [id number]  Setting: Ambulatory outpatient  PCP: Pleas Koch, NP  Type: Established Patient    Referring Provider: Pleas Koch, NP  Location: Office  Delivery: Face-to-face     HPI  [name], a 55 y.o. year old female, is here today because of her Chronic pain syndrome [G89.4]. Ms. Ganesh primary complain today is Back Pain Last encounter: My last encounter with her was on 07/28/2020. Pertinent problems: Ms. Gildner has Numbness of upper extremity; Radiculitis involving upper extremity; Chronic low back pain (1ry area of Pain) (Bilateral) (L>R); Lumbar spondylosis (Bulging Disc & Severe Left Foraminal Stenosis at L3-4); Chronic lower extremity pain (2ry area of Pain) (Left); Lumbar foraminal stenosis (Severe Left L3-4); Lumbar facet syndrome (Bilateral) (L>R); Cervical spondylosis (C5-6 & C6-7 DDD); Cervical (3 mm) Grade 1 Anterolisthesis of C4 over C5; Chronic neck pain (3ry area of Pain) (Bilateral) (L>R); Chronic cervical radicular pain (Bilateral) (L>R); Cervical foraminal stenosis (multilevel) (Bilateral); Muscle spasm, nocturnal; Musculoskeletal pain; Carpal tunnel syndrome (Bilateral) (L>R); Cervical facet syndrome (Bilateral) (L>R); Chronic pain syndrome; DDD (degenerative disc disease), lumbar; DDD (degenerative disc disease), cervical; Pain in right knee; Chronic pain of left knee;  Osteoarthritis involving multiple joints; and Acute exacerbation of chronic low back pain on their pertinent problem list. Pain Assessment: Severity of Chronic pain is reported as a 1  (Just took medication before appt.)/10. Location: Back Lower, Right, Left/buttocks/hips bilateral. Onset: More than a month ago. Quality: Aching, Dull, Discomfort, Sharp, Nagging. Timing: Constant. Modifying factor(s): ice, moving, relax. Vitals:  height is '5\' 4"'  (1.626 m) and weight is 234 lb (106.1 kg). Her temperature is 96.5 F (35.8 C) (abnormal). Her blood pressure is 129/67 and her pulse is 99. Her respiration is 18 and oxygen saturation is 99%.   Reason for encounter: medication management.   The patient indicates doing well with the current medication regimen. No adverse reactions or side effects reported to the medications.   RTCB: 02/05/2021 Nonopioids transferred to 12/24/2019: Skelaxin  Pharmacotherapy Assessment  Analgesic: Hydrocodone/APAP 5/325, 2 tab PO QD (10 mg/day of hydrocodone) MME/day: 10 mg/day.   Monitoring: [place] PMP: PDMP reviewed during this encounter.       Pharmacotherapy: No side-effects or adverse reactions reported. Compliance: No problems identified. Effectiveness: Clinically acceptable.  Ignatius Specking, RN  10/15/2020  9:57 AM  Sign when Signing Visit Nursing Pain Medication Assessment:  Safety precautions to be maintained throughout the outpatient stay will include: orient to surroundings, keep bed in low position, maintain call bell within reach at all times, provide assistance with transfer out of bed and ambulation.  Medication Inspection Compliance: Pill count conducted under aseptic conditions, in front of the patient. Neither the pills nor the bottle was removed from the patient's sight at any time. Once count was completed pills were immediately returned to the patient in their original bottle.  Medication: Hydrocodone/APAP Pill/Patch Count:  50 of 60 pills  remain Pill/Patch Appearance: Markings consistent with prescribed medication Bottle Appearance: Standard pharmacy container.  Clearly labeled. Filled Date: 8 / 19 / 2022 Last Medication intake:  Today    UDS:  Summary  Date Value Ref Range Status  07/28/2020 Note  Final    Comment:    ==================================================================== [employer] (MW) ==================================================================== Test                             Result       Flag       Units  Drug Present and Declared for Prescription Verification   Amphetamine                    343          EXPECTED   ng/mg creat    Amphetamine is available as a schedule II prescription drug.    Lorazepam                      476          EXPECTED   ng/mg creat    Source of lorazepam is a scheduled prescription medication.    Hydrocodone                    321          EXPECTED   ng/mg creat   Dihydrocodeine                 66           EXPECTED   ng/mg creat   Norhydrocodone                 802          EXPECTED   ng/mg creat    Sources of hydrocodone include scheduled prescription medications.    Dihydrocodeine and norhydrocodone are expected metabolites of    hydrocodone. Dihydrocodeine is also available as a scheduled    prescription medication.  ==================================================================== Test                      Result    Flag   Units      Ref Range   Creatinine              92               mg/dL      >=20 ==================================================================== Declared Medications:  The flagging and interpretation on this report are based on the  following declared medications.  Unexpected results may arise from  inaccuracies in the declared medications.   **Note: The testing scope of this panel includes these medications:   Amphetamine (Vyvanse)  Hydrocodone (Norco)  Lorazepam (Ativan)   **Note: The testing scope of this  panel does not include the  following reported medications:   Acetaminophen (Norco)  Albuterol (Ventolin HFA)  Aspirin  Atorvastatin (Lipitor)  Bupropion (Wellbutrin)  Cetirizine (Zyrtec)  Fluticasone (Flonase)  Ibuprofen (Advil)  Lamotrigine (Lamictal)  Metaxalone (Skelaxin)  Metformin  Montelukast (Singulair)  Paroxetine (Paxil)  Spironolactone (Aldactone) ==================================================================== For clinical consultation, please call [phone]. ====================================================================      ROS  Constitutional: Denies any fever or chills Gastrointestinal: No reported hemesis, hematochezia, vomiting, or acute GI distress Musculoskeletal: Denies any acute onset joint swelling, redness, loss of ROM, or weakness Neurological: No reported episodes of acute onset apraxia, aphasia, dysarthria, agnosia, amnesia, paralysis, loss of coordination, or loss of consciousness  Medication Review  HYDROcodone-acetaminophen, LORazepam,  PARoxetine, albuterol, aspirin, atorvastatin, buPROPion, cetirizine, fluticasone, ibuprofen, lamoTRIgine, lisdexamfetamine, metFORMIN, metaxalone, montelukast, and spironolactone  History Review  Allergy: Ms. Suppa is allergic to oxycodone, penicillins, red dye, and sulfa antibiotics. Drug: Ms. Arenson  reports no history of drug use. Alcohol:  reports no history of alcohol use. Tobacco:  reports that she has never smoked. She has never used smokeless tobacco. Social: Ms. Rey  reports that she has never smoked. She has never used smokeless tobacco. She reports that she does not drink alcohol and does not use drugs. Medical:  has a past medical history of Anxiety, Asthma, Bulging lumbar disc (L3-4) (02/26/2015), Degenerative disc disease, Degenerative lumbar disc, Depression, Diabetes mellitus, Elective abortion, GERD (gastroesophageal reflux disease), Hypertension, Stroke ([place]) (06/11/2015), and Vaginal  delivery. Surgical: Ms. Inzunza  has a past surgical history that includes Cholecystectomy (2001); Lapband (04/2012); Wisdom tooth extraction; Dilatation & curettage/hysteroscopy with myosure (N/A, 09/10/2014); mirena; and lap band surgery. Family: family history includes Asthma in her maternal grandmother; Diabetes in her father; Hypertension in her father and mother; Stroke in her maternal grandfather and maternal grandmother.  Laboratory Chemistry Profile   Renal Lab Results  Component Value Date   BUN 15 11/23/2019   CREATININE 0.92 27/07/2374   BCR NOT APPLICABLE 28/31/5176   GFR 58.78 (L) 02/08/2019   GFRAA >60 12/16/2017   GFRNONAA >60 12/16/2017    Hepatic Lab Results  Component Value Date   AST 13 11/23/2019   ALT 14 11/23/2019   ALBUMIN 4.4 02/08/2019   ALKPHOS 108 02/08/2019   HCVAB NEGATIVE 05/18/2013    Electrolytes Lab Results  Component Value Date   NA 140 11/23/2019   K 4.8 11/23/2019   CL 105 11/23/2019   CALCIUM 9.3 11/23/2019   PHOS 4.0 10/31/2015    Bone Lab Results  Component Value Date   VD25OH 31.95 02/08/2019    Inflammation (CRP: Acute Phase) (ESR: Chronic Phase) Lab Results  Component Value Date   ESRSEDRATE 32 05/26/2017         Note: Above Lab results reviewed.  Recent Imaging Review  VAS [name] LOWER EXTREMITY VENOUS (DVT) ([facility] and [facility] 7a-7p)  Lower Venous Study  Indications: Pain.   Performing Technologist: Maudry Mayhew MHA, RDMS, RVT, RDCS    Examination Guidelines: A complete evaluation includes B-mode imaging, spectral Doppler, color Doppler, and power Doppler as needed of all accessible portions of each vessel. Bilateral testing is considered an integral part of a complete examination. Limited examinations for reoccurring indications may be performed as noted.    Right Venous Findings: +---+---------------+---------+-----------+----------+-------+     CompressibilityPhasicitySpontaneityPropertiesSummary +---+---------------+---------+-----------+----------+-------+ CFVFull           Yes      Yes                          +---+---------------+---------+-----------+----------+-------+     Left Venous Findings: +---------+---------------+---------+-----------+----------+-------+          CompressibilityPhasicitySpontaneityPropertiesSummary +---------+---------------+---------+-----------+----------+-------+ CFV      Full           Yes      Yes                          +---------+---------------+---------+-----------+----------+-------+ SFJ      Full                                                 +---------+---------------+---------+-----------+----------+-------+  FV Prox  Full                                                 +---------+---------------+---------+-----------+----------+-------+ FV Mid   Full                                                 +---------+---------------+---------+-----------+----------+-------+ FV DistalFull                                                 +---------+---------------+---------+-----------+----------+-------+ PFV      Full                                                 +---------+---------------+---------+-----------+----------+-------+ POP      Full           Yes      Yes                          +---------+---------------+---------+-----------+----------+-------+ PTV      Full                                                 +---------+---------------+---------+-----------+----------+-------+ PERO     Full                                                 +---------+---------------+---------+-----------+----------+-------+          Summary: Right: No evidence of common femoral vein obstruction. Left: There is no evidence of deep vein thrombosis in the lower extremity. No cystic structure found in the  popliteal fossa.   *See table(s) above for measurements and observations.  Electronically signed by Monica Martinez MD on 12/16/2017 at 6:04:46 PM.      Final   Note: Reviewed        Physical Exam  General appearance: Well nourished, well developed, and well hydrated. In no apparent acute distress Mental status: Alert, oriented x 3 (person, place, & time)       Respiratory: No evidence of acute respiratory distress Eyes: PERLA Vitals: BP 129/67   Pulse 99   Temp (!) 96.5 F (35.8 C)   Resp 18   Ht '5\' 4"'  (1.626 m)   Wt 234 lb (106.1 kg)   SpO2 99%   BMI 40.17 kg/m  BMI: Estimated body mass index is 40.17 kg/m as calculated from the following:   Height as of this encounter: '5\' 4"'  (1.626 m).   Weight as of this encounter: 234 lb (106.1 kg). Ideal: Ideal body weight: 54.7 kg (120 lb 9.5 oz) Adjusted ideal body weight: 75.3 kg (165 lb 15.3 oz)  Assessment   Status Diagnosis  Controlled Controlled Controlled 1. Chronic pain syndrome   2. Chronic low back pain (1ry area of Pain) (Bilateral) (L>R)   3. Chronic lower extremity pain (2ry area of Pain) (Left)   4. Chronic neck pain (3ry area of Pain) (Bilateral) (L>R)   5. Lumbar spondylosis (Bulging Disc & Severe Left Foraminal Stenosis at L3-4)   6. Pharmacologic therapy   7. Chronic use of opiate for therapeutic purpose   8. Encounter for medication management      Updated Problems: Problem  Chronic neck pain (3ry area of Pain) (Bilateral) (L>R)  Chronic low back pain (1ry area of Pain) (Bilateral) (L>R)  Chronic lower extremity pain (2ry area of Pain) (Left)   Pain goes down to the level of the knee through the lateral portion of the leg.     Plan of Care  Problem-specific:  No problem-specific Assessment & Plan notes found for this encounter.  Ms. AMINAT SHELBURNE has a current medication list which includes the following long-term medication(s): albuterol, atorvastatin, fluticasone, [START ON 11/07/2020]  hydrocodone-acetaminophen, [START ON 12/07/2020] hydrocodone-acetaminophen, [START ON 01/06/2021] hydrocodone-acetaminophen, metaxalone, metformin, montelukast, and spironolactone.  Pharmacotherapy (Medications Ordered): Meds ordered this encounter  Medications   HYDROcodone-acetaminophen (NORCO/VICODIN) 5-325 MG tablet    Sig: Take 0.5-1 tablets by mouth 2 (two) times daily as needed for severe pain. Must last 30 days    Dispense:  60 tablet    Refill:  0    Not a duplicate. Do NOT delete! Dispense 1 day early if closed on refill date. Avoid benzodiazepines within 8 hours of opioids. Do not send refill requests.   HYDROcodone-acetaminophen (NORCO/VICODIN) 5-325 MG tablet    Sig: Take 0.5-1 tablets by mouth 2 (two) times daily as needed for severe pain. Must last 30 days    Dispense:  60 tablet    Refill:  0    Not a duplicate. Do NOT delete! Dispense 1 day early if closed on refill date. Avoid benzodiazepines within 8 hours of opioids. Do not send refill requests.   HYDROcodone-acetaminophen (NORCO/VICODIN) 5-325 MG tablet    Sig: Take 0.5-1 tablets by mouth 2 (two) times daily as needed for severe pain. Must last 30 days    Dispense:  60 tablet    Refill:  0    Not a duplicate. Do NOT delete! Dispense 1 day early if closed on refill date. Avoid benzodiazepines within 8 hours of opioids. Do not send refill requests.    Orders:  No orders of the defined types were placed in this encounter.  Follow-up plan:   Return in about 4 months (around 02/05/2021) for Eval-day(M,W), (F2F), (MM).     Interventional management options: Planned, scheduled, and/or pending:      Considering:   Diagnostic bilateral lumbar facet block  Possible bilateral lumbar facet RFA.  Diagnostic left L3-4 LESI  Diagnostic left L3 TFESI  Diagnostic left L4 TFESI  Diagnostic bilateral L5 TFESI  Diagnostic left CESI  Diagnostic bilateral cervical facet block  Possible bilateral cervical facet RFA.     Palliative PRN treatment(s):   None at this time    Recent Visits Date Type Provider Dept  07/28/20 Office Visit Milinda Pointer, MD [facility]  Showing recent visits within past 90 days and meeting all other requirements Today's Visits Date Type Provider Dept  10/15/20 Office Visit Milinda Pointer, MD [facility]  Showing today's visits and meeting all other requirements Future Appointments No visits were found meeting these conditions. Showing future appointments  within next 90 days and meeting all other requirements I discussed the assessment and treatment plan with the patient. The patient was provided an opportunity to ask questions and all were answered. The patient agreed with the plan and demonstrated an understanding of the instructions.  Patient advised to call back or seek an in-person evaluation if the symptoms or condition worsens.  Duration of encounter: 30 minutes.  Note by: Gaspar Cola, MD Date: 10/15/2020; Time: 10:32 AM

## 2020-10-15 ENCOUNTER — Other Ambulatory Visit: Payer: Self-pay

## 2020-10-15 ENCOUNTER — Ambulatory Visit: Payer: Managed Care, Other (non HMO) | Attending: Pain Medicine | Admitting: Pain Medicine

## 2020-10-15 VITALS — BP 129/67 | HR 99 | Temp 96.5°F | Resp 18 | Ht 64.0 in | Wt 234.0 lb

## 2020-10-15 DIAGNOSIS — M79605 Pain in left leg: Secondary | ICD-10-CM | POA: Insufficient documentation

## 2020-10-15 DIAGNOSIS — M542 Cervicalgia: Secondary | ICD-10-CM | POA: Diagnosis present

## 2020-10-15 DIAGNOSIS — Z79899 Other long term (current) drug therapy: Secondary | ICD-10-CM

## 2020-10-15 DIAGNOSIS — M545 Low back pain, unspecified: Secondary | ICD-10-CM | POA: Insufficient documentation

## 2020-10-15 DIAGNOSIS — Z79891 Long term (current) use of opiate analgesic: Secondary | ICD-10-CM | POA: Diagnosis present

## 2020-10-15 DIAGNOSIS — G894 Chronic pain syndrome: Secondary | ICD-10-CM

## 2020-10-15 DIAGNOSIS — G8929 Other chronic pain: Secondary | ICD-10-CM | POA: Insufficient documentation

## 2020-10-15 DIAGNOSIS — M47816 Spondylosis without myelopathy or radiculopathy, lumbar region: Secondary | ICD-10-CM | POA: Insufficient documentation

## 2020-10-15 MED ORDER — HYDROCODONE-ACETAMINOPHEN 5-325 MG PO TABS: 0.5000 | ORAL_TABLET | Freq: Two times a day (BID) | ORAL | 0 refills | Status: DC | PRN

## 2020-10-15 NOTE — Patient Instructions (Signed)
____________________________________________________________________________________________  Medication Rules  Purpose: To inform patients, and their family members, of our rules and regulations.  Applies to: All patients receiving prescriptions (written or electronic).  Pharmacy of record: Pharmacy where electronic prescriptions will be sent. If written prescriptions are taken to a different pharmacy, please inform the nursing staff. The pharmacy listed in the electronic medical record should be the one where you would like electronic prescriptions to be sent.  Electronic prescriptions: In compliance with the [place] Strengthen Opioid Misuse Prevention (STOP) Act of 2017 (Session Law 2017-74/H243), effective February 22, 2018, all controlled substances must be electronically prescribed. Calling prescriptions to the pharmacy will cease to exist.  Prescription refills: Only during scheduled appointments. Applies to all prescriptions.  NOTE: The following applies primarily to controlled substances (Opioid* Pain Medications).   Type of encounter (visit): For patients receiving controlled substances, face-to-face visits are required. (Not an option or up to the patient.)  Patient's responsibilities: Pain Pills: Bring all pain pills to every appointment (except for procedure appointments). Pill Bottles: Bring pills in original pharmacy bottle. Always bring the newest bottle. Bring bottle, even if empty. Medication refills: You are responsible for knowing and keeping track of what medications you take and those you need refilled. The day before your appointment: write a list of all prescriptions that need to be refilled. The day of the appointment: give the list to the admitting nurse. Prescriptions will be written only during appointments. No prescriptions will be written on procedure days. If you forget a medication: it will not be "Called in", "Faxed", or "electronically sent". You will  need to get another appointment to get these prescribed. No early refills. Do not call asking to have your prescription filled early. Prescription Accuracy: You are responsible for carefully inspecting your prescriptions before leaving our office. Have the discharge nurse carefully go over each prescription with you, before taking them home. Make sure that your name is accurately spelled, that your address is correct. Check the name and dose of your medication to make sure it is accurate. Check the number of pills, and the written instructions to make sure they are clear and accurate. Make sure that you are given enough medication to last until your next medication refill appointment. Taking Medication: Take medication as prescribed. When it comes to controlled substances, taking less pills or less frequently than prescribed is permitted and encouraged. Never take more pills than instructed. Never take medication more frequently than prescribed.  Inform other Doctors: Always inform, all of your healthcare providers, of all the medications you take. Pain Medication from other Providers: You are not allowed to accept any additional pain medication from any other Doctor or Healthcare provider. There are two exceptions to this rule. (see below) In the event that you require additional pain medication, you are responsible for notifying us, as stated below. Cough Medicine: Often these contain an opioid, such as codeine or hydrocodone. Never accept or take cough medicine containing these opioids if you are already taking an opioid* medication. The combination may cause respiratory failure and death. Medication Agreement: You are responsible for carefully reading and following our Medication Agreement. This must be signed before receiving any prescriptions from our practice. Safely store a copy of your signed Agreement. Violations to the Agreement will result in no further prescriptions. (Additional copies of our  Medication Agreement are available upon request.) Laws, Rules, & Regulations: All patients are expected to follow all Federal and State Laws, Statutes, Rules, & Regulations. Ignorance of   the Laws does not constitute a valid excuse.  Illegal drugs and Controlled Substances: The use of illegal substances (including, but not limited to marijuana and its derivatives) and/or the illegal use of any controlled substances is strictly prohibited. Violation of this rule may result in the immediate and permanent discontinuation of any and all prescriptions being written by our practice. The use of any illegal substances is prohibited. Adopted CDC guidelines & recommendations: Target dosing levels will be at or below 60 MME/day. Use of benzodiazepines** is not recommended.  Exceptions: There are only two exceptions to the rule of not receiving pain medications from other Healthcare Providers. Exception #1 (Emergencies): In the event of an emergency (i.e.: accident requiring emergency care), you are allowed to receive additional pain medication. However, you are responsible for: As soon as you are able, call our office [phone], at any time of the day or night, and leave a message stating your name, the date and nature of the emergency, and the name and dose of the medication prescribed. In the event that your call is answered by a member of our staff, make sure to document and save the date, time, and the name of the person that took your information.  Exception #2 (Planned Surgery): In the event that you are scheduled by another doctor or dentist to have any type of surgery or procedure, you are allowed (for a period no longer than 30 days), to receive additional pain medication, for the acute post-op pain. However, in this case, you are responsible for picking up a copy of our "Post-op Pain Management for Surgeons" handout, and giving it to your surgeon or dentist. This document is available at our office, and  does not require an appointment to obtain it. Simply go to our office during business hours (Monday-Thursday from 8:00 AM to 4:00 PM) (Friday 8:00 AM to 12:00 Noon) or if you have a scheduled appointment with us, prior to your surgery, and ask for it by name. In addition, you are responsible for: calling our office [phone], at any time of the day or night, and leaving a message stating your name, name of your surgeon, type of surgery, and date of procedure or surgery. Failure to comply with your responsibilities may result in termination of therapy involving the controlled substances.  *Opioid medications include: morphine, codeine, oxycodone, oxymorphone, hydrocodone, hydromorphone, meperidine, tramadol, tapentadol, buprenorphine, fentanyl, methadone. **Benzodiazepine medications include: diazepam (Valium), alprazolam (Xanax), clonazepam (Klonopine), lorazepam (Ativan), clorazepate (Tranxene), chlordiazepoxide (Librium), estazolam (Prosom), oxazepam (Serax), temazepam (Restoril), triazolam (Halcion) (Last updated: 01/21/2020) ____________________________________________________________________________________________  ____________________________________________________________________________________________  Medication Recommendations and Reminders  Applies to: All patients receiving prescriptions (written and/or electronic).  Medication Rules & Regulations: These rules and regulations exist for your safety and that of others. They are not flexible and neither are we. Dismissing or ignoring them will be considered "non-compliance" with medication therapy, resulting in complete and irreversible termination of such therapy. (See document titled "Medication Rules" for more details.) In all conscience, because of safety reasons, we cannot continue providing a therapy where the patient does not follow instructions.  Pharmacy of record:  Definition: This is the pharmacy where your electronic  prescriptions will be sent.  We do not endorse any particular pharmacy, however, we have experienced problems with [facility] not securing enough medication supply for the community. We do not restrict you in your choice of pharmacy. However, once we write for your prescriptions, we will NOT be re-sending more prescriptions to fix restricted supply problems   created by your pharmacy, or your insurance.  The pharmacy listed in the electronic medical record should be the one where you want electronic prescriptions to be sent. If you choose to change pharmacy, simply notify our nursing staff.  Recommendations: Keep all of your pain medications in a safe place, under lock and key, even if you live alone. We will NOT replace lost, stolen, or damaged medication. After you fill your prescription, take 1 week's worth of pills and put them away in a safe place. You should keep a separate, properly labeled bottle for this purpose. The remainder should be kept in the original bottle. Use this as your primary supply, until it runs out. Once it's gone, then you know that you have 1 week's worth of medicine, and it is time to come in for a prescription refill. If you do this correctly, it is unlikely that you will ever run out of medicine. To make sure that the above recommendation works, it is very important that you make sure your medication refill appointments are scheduled at least 1 week before you run out of medicine. To do this in an effective manner, make sure that you do not leave the office without scheduling your next medication management appointment. Always ask the nursing staff to show you in your prescription , when your medication will be running out. Then arrange for the receptionist to get you a return appointment, at least 7 days before you run out of medicine. Do not wait until you have 1 or 2 pills left, to come in. This is very poor planning and does not take into consideration that we may need to  cancel appointments due to bad weather, sickness, or emergencies affecting our staff. DO NOT ACCEPT A "Partial Fill": If for any reason your pharmacy does not have enough pills/tablets to completely fill or refill your prescription, do not allow for a "partial fill". The law allows the pharmacy to complete that prescription within 72 hours, without requiring a new prescription. If they do not fill the rest of your prescription within those 72 hours, you will need a separate prescription to fill the remaining amount, which we will NOT provide. If the reason for the partial fill is your insurance, you will need to talk to the pharmacist about payment alternatives for the remaining tablets, but again, DO NOT ACCEPT A PARTIAL FILL, unless you can trust your pharmacist to obtain the remainder of the pills within 72 hours.  Prescription refills and/or changes in medication(s):  Prescription refills, and/or changes in dose or medication, will be conducted only during scheduled medication management appointments. (Applies to both, written and electronic prescriptions.) No refills on procedure days. No medication will be changed or started on procedure days. No changes, adjustments, and/or refills will be conducted on a procedure day. Doing so will interfere with the diagnostic portion of the procedure. No phone refills. No medications will be "called into the pharmacy". No Fax refills. No weekend refills. No Holliday refills. No after hours refills.  Remember:  Business hours are:  Monday to Thursday 8:00 AM to 4:00 PM Provider's Schedule: Jaegar Croft, MD - Appointments are:  Medication management: Monday and Wednesday 8:00 AM to 4:00 PM Procedure day: Tuesday and Thursday 7:30 AM to 4:00 PM Bilal Lateef, MD - Appointments are:  Medication management: Tuesday and Thursday 8:00 AM to 4:00 PM Procedure day: Monday and Wednesday 7:30 AM to 4:00 PM (Last update:  09/12/2019) ____________________________________________________________________________________________  ____________________________________________________________________________________________  CBD (cannabidiol) WARNING    Applicable to: All individuals currently taking or considering taking CBD (cannabidiol) and, more important, all patients taking opioid analgesic controlled substances (pain medication). (Example: oxycodone; oxymorphone; hydrocodone; hydromorphone; morphine; methadone; tramadol; tapentadol; fentanyl; buprenorphine; butorphanol; dextromethorphan; meperidine; codeine; etc.)  Legal status: CBD remains a Schedule I drug prohibited for any use. CBD is illegal with one exception. In the [place], CBD has a limited Food and Drug Administration (FDA) approval for the treatment of two specific types of epilepsy disorders. Only one CBD product has been approved by the FDA for this purpose: "Epidiolex". FDA is aware that some companies are marketing products containing cannabis and cannabis-derived compounds in ways that violate the Federal Food, Drug and Cosmetic Act (FD&C Act) and that may put the health and safety of consumers at risk. The FDA, a Federal agency, has not enforced the CBD status since 2018.   Legality: Some manufacturers ship CBD products nationally, which is illegal. Often such products are sold online and are therefore available throughout the country. CBD is openly sold in head shops and health food stores in some states where such sales have not been explicitly legalized. Selling unapproved products with unsubstantiated therapeutic claims is not only a violation of the law, but also can put patients at risk, as these products have not been proven to be safe or effective. Federal illegality makes it difficult to conduct research on CBD.  Reference: "FDA Regulation of Cannabis and Cannabis-Derived Products, Including Cannabidiol (CBD)" -  https://www.fda.gov/news-events/public-health-focus/fda-regulation-cannabis-and-cannabis-derived-products-including-cannabidiol-cbd  Warning: CBD is not FDA approved and has not undergo the same manufacturing controls as prescription drugs.  This means that the purity and safety of available CBD may be questionable. Most of the time, despite manufacturer's claims, it is contaminated with THC (delta-9-tetrahydrocannabinol - the chemical in marijuana responsible for the "HIGH").  When this is the case, the THC contaminant will trigger a positive urine drug screen (UDS) test for Marijuana (carboxy-THC). Because a positive UDS for any illicit substance is a violation of our medication agreement, your opioid analgesics (pain medicine) may be permanently discontinued.  MORE ABOUT CBD  General Information: CBD  is a derivative of the Marijuana (cannabis sativa) plant discovered in 1940. It is one of the 113 identified substances found in Marijuana. It accounts for up to 40% of the plant's extract. As of 2018, preliminary clinical studies on CBD included research for the treatment of anxiety, movement disorders, and pain. CBD is available and consumed in multiple forms, including inhalation of smoke or vapor, as an aerosol spray, and by mouth. It may be supplied as an oil containing CBD, capsules, dried cannabis, or as a liquid solution. CBD is thought not to be as psychoactive as THC (delta-9-tetrahydrocannabinol - the chemical in marijuana responsible for the "HIGH"). Studies suggest that CBD may interact with different biological target receptors in the body, including cannabinoid and other neurotransmitter receptors. As of 2018 the mechanism of action for its biological effects has not been determined.  Side-effects  Adverse reactions: Dry mouth, diarrhea, decreased appetite, fatigue, drowsiness, malaise, weakness, sleep disturbances, and others.  Drug interactions: CBC may interact with other medications  such as blood-thinners. (Last update: 09/29/2019) ____________________________________________________________________________________________  ____________________________________________________________________________________________  Drug Holidays (Slow)  What is a "Drug Holiday"? Drug Holiday: is the name given to the period of time during which a patient stops taking a medication(s) for the purpose of eliminating tolerance to the drug.  Benefits Improved effectiveness of opioids. Decreased opioid dose needed to achieve benefits. Improved pain with lesser dose.    What is tolerance? Tolerance: is the progressive decreased in effectiveness of a drug due to its repetitive use. With repetitive use, the body gets use to the medication and as a consequence, it loses its effectiveness. This is a common problem seen with opioid pain medications. As a result, a larger dose of the drug is needed to achieve the same effect that used to be obtained with a smaller dose.  How long should a "Drug Holiday" last? You should stay off of the pain medicine for at least 14 consecutive days. (2 weeks)  Should I stop the medicine "cold turkey"? No. You should always coordinate with your Pain Specialist so that he/she can provide you with the correct medication dose to make the transition as smoothly as possible.  How do I stop the medicine? Slowly. You will be instructed to decrease the daily amount of pills that you take by one (1) pill every seven (7) days. This is called a "slow downward taper" of your dose. For example: if you normally take four (4) pills per day, you will be asked to drop this dose to three (3) pills per day for seven (7) days, then to two (2) pills per day for seven (7) days, then to one (1) per day for seven (7) days, and at the end of those last seven (7) days, this is when the "Drug Holiday" would start.   Will I have withdrawals? By doing a "slow downward taper" like this one, it  is unlikely that you will experience any significant withdrawal symptoms. Typically, what triggers withdrawals is the sudden stop of a high dose opioid therapy. Withdrawals can usually be avoided by slowly decreasing the dose over a prolonged period of time. If you do not follow these instructions and decide to stop your medication abruptly, withdrawals may be possible.  What are withdrawals? Withdrawals: refers to the wide range of symptoms that occur after stopping or dramatically reducing opiate drugs after heavy and prolonged use. Withdrawal symptoms do not occur to patients that use low dose opioids, or those who take the medication sporadically. Contrary to benzodiazepine (example: Valium, Xanax, etc.) or alcohol withdrawals ("Delirium Tremens"), opioid withdrawals are not lethal. Withdrawals are the physical manifestation of the body getting rid of the excess receptors.  Expected Symptoms Early symptoms of withdrawal may include: Agitation Anxiety Muscle aches Increased tearing Insomnia Runny nose Sweating Yawning  Late symptoms of withdrawal may include: Abdominal cramping Diarrhea Dilated pupils Goose bumps Nausea Vomiting  Will I experience withdrawals? Due to the slow nature of the taper, it is very unlikely that you will experience any.  What is a slow taper? Taper: refers to the gradual decrease in dose.  (Last update: 09/12/2019) ____________________________________________________________________________________________    

## 2020-10-15 NOTE — Progress Notes (Signed)
Nursing Pain Medication Assessment:  Safety precautions to be maintained throughout the outpatient stay will include: orient to surroundings, keep bed in low position, maintain call bell within reach at all times, provide assistance with transfer out of bed and ambulation.  Medication Inspection Compliance: Pill count conducted under aseptic conditions, in front of the patient. Neither the pills nor the bottle was removed from the patient's sight at any time. Once count was completed pills were immediately returned to the patient in their original bottle.  Medication: Hydrocodone/APAP Pill/Patch Count:  50 of 60 pills remain Pill/Patch Appearance: Markings consistent with prescribed medication Bottle Appearance: Standard pharmacy container. Clearly labeled. Filled Date: 8 / 65 / 2022 Last Medication intake:  Today

## 2020-12-13 ENCOUNTER — Other Ambulatory Visit: Payer: Self-pay | Admitting: Primary Care

## 2020-12-13 DIAGNOSIS — M7918 Myalgia, other site: Secondary | ICD-10-CM

## 2020-12-13 DIAGNOSIS — M62838 Other muscle spasm: Secondary | ICD-10-CM

## 2020-12-16 ENCOUNTER — Other Ambulatory Visit: Payer: Self-pay | Admitting: Primary Care

## 2020-12-16 DIAGNOSIS — R7303 Prediabetes: Secondary | ICD-10-CM

## 2020-12-16 DIAGNOSIS — E282 Polycystic ovarian syndrome: Secondary | ICD-10-CM

## 2020-12-16 DIAGNOSIS — J309 Allergic rhinitis, unspecified: Secondary | ICD-10-CM

## 2020-12-16 DIAGNOSIS — E781 Pure hyperglyceridemia: Secondary | ICD-10-CM

## 2020-12-21 NOTE — Progress Notes (Signed)
PROVIDER NOTE: Information contained herein reflects review and annotations entered in association with encounter. Interpretation of such information and data should be left to medically-trained personnel. Information provided to patient can be located elsewhere in the medical record under "Patient Instructions". Document created using STT-dictation technology, any transcriptional errors that may result from process are unintentional.    Patient: Debra Myers  Service Category: E/M  Provider: Gaspar Cola, MD  DOB: 02/16/1966  DOS: 12/22/2020  Specialty: Interventional Pain Management  MRN: [id number]  Setting: Ambulatory outpatient  PCP: Pleas Koch, NP  Type: Established Patient    Referring Provider: Pleas Koch, NP  Location: Office  Delivery: Face-to-face     HPI  [name], a 55 y.o. year old female, is here today because of her Chronic bilateral low back pain without sciatica [M54.50, G89.29]. Ms. Shareef primary complain today is Back Pain (lower) Last encounter: My last encounter with her was on 10/15/2020. Pertinent problems: Ms. Lipuma has Numbness of upper extremity; Radiculitis involving upper extremity; Chronic low back pain (1ry area of Pain) (Bilateral) (L>R); Lumbar spondylosis (Bulging Disc & Severe Left Foraminal Stenosis at L3-4); Chronic lower extremity pain (2ry area of Pain) (Left); Lumbar foraminal stenosis (Severe Left L3-4); Lumbar facet syndrome (Bilateral) (L>R); Cervical spondylosis (C5-6 & C6-7 DDD); Cervical (3 mm) Grade 1 Anterolisthesis of C4 over C5; Chronic neck pain (3ry area of Pain) (Bilateral) (L>R); Chronic cervical radicular pain (Bilateral) (L>R); Cervical foraminal stenosis (multilevel) (Bilateral); Muscle spasm, nocturnal; Musculoskeletal pain; Carpal tunnel syndrome (Bilateral) (L>R); Cervical facet syndrome (Bilateral) (L>R); Chronic pain syndrome; DDD (degenerative disc disease), lumbar; DDD (degenerative disc disease), cervical;  Pain in right knee; Chronic pain of left knee; Osteoarthritis involving multiple joints; and Acute exacerbation of chronic low back pain on their pertinent problem list. Pain Assessment: Severity of Chronic pain is reported as a 1 /10. Location: Back Right, Left, Lower/Denies. Onset: More than a month ago. Quality: Aching, Burning. Timing: Constant. Modifying factor(s): Meds and laying. Vitals:  height is _0  (1.626 m) and weight is 234 lb (106.1 kg). Her temporal temperature is 96.6 F (35.9 C) (abnormal). Her blood pressure is 140/84 and her pulse is 91. Her respiration is 16 and oxygen saturation is 100%.   Reason for encounter: medication management.   The patient indicates doing well with the current medication regimen. No adverse reactions or side effects reported to the medications.   RTCB: 04/06/2021 Nonopioids transferred to 12/24/2019: Skelaxin  Pharmacotherapy Assessment  Analgesic: Hydrocodone/APAP 5/325, 2 tab PO QD (10 mg/day of hydrocodone) MME/day: 10 mg/day.   Monitoring: [place] PMP: PDMP reviewed during this encounter.       Pharmacotherapy: No side-effects or adverse reactions reported. Compliance: No problems identified. Effectiveness: Clinically acceptable.  Chauncey Fischer, RN  12/22/2020  9:44 AM  Sign when Signing Visit Nursing Pain Medication Assessment:  Safety precautions to be maintained throughout the outpatient stay will include: orient to surroundings, keep bed in low position, maintain call bell within reach at all times, provide assistance with transfer out of bed and ambulation.  Medication Inspection Compliance: Pill count conducted under aseptic conditions, in front of the patient. Neither the pills nor the bottle was removed from the patient's sight at any time. Once count was completed pills were immediately returned to the patient in their original bottle.  Medication: Hydrocodone/APAP Pill/Patch Count:  41 of 60 pills remain Pill/Patch Appearance:  Markings consistent with prescribed medication Bottle Appearance: Standard pharmacy container. Clearly labeled.  Filled Date: 21 / 21 / 2022 Last Medication intake:  Today Safety precautions to be maintained throughout the outpatient stay will include: orient to surroundings, keep bed in low position, maintain call bell within reach at all times, provide assistance with transfer out of bed and ambulation.      UDS:  Summary  Date Value Ref Range Status  07/28/2020 Note  Final    Comment:    ==================================================================== [facility] (MW) ==================================================================== Test                             Result       Flag       Units  Drug Present and Declared for Prescription Verification   Amphetamine                    343          EXPECTED   ng/mg creat    Amphetamine is available as a schedule II prescription drug.    Lorazepam                      476          EXPECTED   ng/mg creat    Source of lorazepam is a scheduled prescription medication.    Hydrocodone                    321          EXPECTED   ng/mg creat   Dihydrocodeine                 66           EXPECTED   ng/mg creat   Norhydrocodone                 802          EXPECTED   ng/mg creat    Sources of hydrocodone include scheduled prescription medications.    Dihydrocodeine and norhydrocodone are expected metabolites of    hydrocodone. Dihydrocodeine is also available as a scheduled    prescription medication.  ==================================================================== Test                      Result    Flag   Units      Ref Range   Creatinine              92               mg/dL      >=20 ==================================================================== Declared Medications:  The flagging and interpretation on this report are based on the  following declared medications.  Unexpected results may arise from  inaccuracies  in the declared medications.   **Note: The testing scope of this panel includes these medications:   Amphetamine (Vyvanse)  Hydrocodone (Norco)  Lorazepam (Ativan)   **Note: The testing scope of this panel does not include the  following reported medications:   Acetaminophen (Norco)  Albuterol (Ventolin HFA)  Aspirin  Atorvastatin (Lipitor)  Bupropion (Wellbutrin)  Cetirizine (Zyrtec)  Fluticasone (Flonase)  Ibuprofen (Advil)  Lamotrigine (Lamictal)  Metaxalone (Skelaxin)  Metformin  Montelukast (Singulair)  Paroxetine (Paxil)  Spironolactone (Aldactone) ==================================================================== For clinical consultation, please call [phone]. ====================================================================      ROS  Constitutional: Denies any fever or chills Gastrointestinal: No reported hemesis, hematochezia, vomiting, or acute GI distress Musculoskeletal: Denies any  acute onset joint swelling, redness, loss of ROM, or weakness Neurological: No reported episodes of acute onset apraxia, aphasia, dysarthria, agnosia, amnesia, paralysis, loss of coordination, or loss of consciousness  Medication Review  HYDROcodone-acetaminophen, LORazepam, PARoxetine, albuterol, aspirin, atorvastatin, buPROPion, cetirizine, fluticasone, ibuprofen, lamoTRIgine, lisdexamfetamine, metFORMIN, metaxalone, montelukast, and spironolactone  History Review  Allergy: Ms. Brickley is allergic to oxycodone, penicillins, red dye, and sulfa antibiotics. Drug: Ms. Mayson  reports no history of drug use. Alcohol:  reports no history of alcohol use. Tobacco:  reports that she has never smoked. She has never used smokeless tobacco. Social: Ms. Leyba  reports that she has never smoked. She has never used smokeless tobacco. She reports that she does not drink alcohol and does not use drugs. Medical:  has a past medical history of Anxiety, Asthma, Bulging lumbar disc (L3-4)  (02/26/2015), Degenerative disc disease, Degenerative lumbar disc, Depression, Diabetes mellitus, Elective abortion, GERD (gastroesophageal reflux disease), Hypertension, Stroke ([place]) (06/11/2015), and Vaginal delivery. Surgical: Ms. Ryce  has a past surgical history that includes Cholecystectomy (2001); Lapband (04/2012); Wisdom tooth extraction; Dilatation & curettage/hysteroscopy with myosure (N/A, 09/10/2014); mirena; and lap band surgery. Family: family history includes Asthma in her maternal grandmother; Diabetes in her father; Hypertension in her father and mother; Stroke in her maternal grandfather and maternal grandmother.  Laboratory Chemistry Profile   Renal Lab Results  Component Value Date   BUN 15 11/23/2019   CREATININE 0.92 38/18/2993   BCR NOT APPLICABLE 71/69/6789   GFR 58.78 (L) 02/08/2019   GFRAA >60 12/16/2017   GFRNONAA >60 12/16/2017    Hepatic Lab Results  Component Value Date   AST 13 11/23/2019   ALT 14 11/23/2019   ALBUMIN 4.4 02/08/2019   ALKPHOS 108 02/08/2019   HCVAB NEGATIVE 05/18/2013    Electrolytes Lab Results  Component Value Date   NA 140 11/23/2019   K 4.8 11/23/2019   CL 105 11/23/2019   CALCIUM 9.3 11/23/2019   PHOS 4.0 10/31/2015    Bone Lab Results  Component Value Date   VD25OH 31.95 02/08/2019    Inflammation (CRP: Acute Phase) (ESR: Chronic Phase) Lab Results  Component Value Date   ESRSEDRATE 32 05/26/2017         Note: Above Lab results reviewed.  Recent Imaging Review  VAS [name] LOWER EXTREMITY VENOUS (DVT) ([facility] and [facility] 7a-7p)  Lower Venous Study  Indications: Pain.   Performing Technologist: Maudry Mayhew MHA, RDMS, RVT, RDCS    Examination Guidelines: A complete evaluation includes B-mode imaging, spectral Doppler, color Doppler, and power Doppler as needed of all accessible portions of each vessel. Bilateral testing is considered an integral part of a complete examination. Limited examinations for reoccurring  indications may be performed as noted.    Right Venous Findings: +---+---------------+---------+-----------+----------+-------+    CompressibilityPhasicitySpontaneityPropertiesSummary +---+---------------+---------+-----------+----------+-------+ CFVFull           Yes      Yes                          +---+---------------+---------+-----------+----------+-------+     Left Venous Findings: +---------+---------------+---------+-----------+----------+-------+          CompressibilityPhasicitySpontaneityPropertiesSummary +---------+---------------+---------+-----------+----------+-------+ CFV      Full           Yes      Yes                          +---------+---------------+---------+-----------+----------+-------+ SFJ  Full                                                 +---------+---------------+---------+-----------+----------+-------+ FV Prox  Full                                                 +---------+---------------+---------+-----------+----------+-------+ FV Mid   Full                                                 +---------+---------------+---------+-----------+----------+-------+ FV DistalFull                                                 +---------+---------------+---------+-----------+----------+-------+ PFV      Full                                                 +---------+---------------+---------+-----------+----------+-------+ POP      Full           Yes      Yes                          +---------+---------------+---------+-----------+----------+-------+ PTV      Full                                                 +---------+---------------+---------+-----------+----------+-------+ PERO     Full                                                 +---------+---------------+---------+-----------+----------+-------+          Summary: Right: No evidence of common  femoral vein obstruction. Left: There is no evidence of deep vein thrombosis in the lower extremity. No cystic structure found in the popliteal fossa.   *See table(s) above for measurements and observations.  Electronically signed by Monica Martinez MD on 12/16/2017 at 6:04:46 PM.      Final   Note: Reviewed        Physical Exam  General appearance: Well nourished, well developed, and well hydrated. In no apparent acute distress Mental status: Alert, oriented x 3 (person, place, & time)       Respiratory: No evidence of acute respiratory distress Eyes: PERLA Vitals: BP 140/84 (BP Location: Right Arm, Patient Position: Sitting, Cuff Size: Large)   Pulse 91   Temp (!) 96.6 F (35.9 C) (Temporal)   Resp 16   Ht _0  (1.626 m)   Wt 234 lb (106.1 kg)   SpO2 100%   BMI 40.17 kg/m  BMI:  Estimated body mass index is 40.17 kg/m as calculated from the following:   Height as of this encounter: _0  (1.626 m).   Weight as of this encounter: 234 lb (106.1 kg). Ideal: Ideal body weight: 54.7 kg (120 lb 9.5 oz) Adjusted ideal body weight: 75.3 kg (165 lb 15.3 oz)  Assessment   Status Diagnosis  Controlled Controlled Controlled 1. Chronic low back pain (1ry area of Pain) (Bilateral) (L>R)   2. Chronic lower extremity pain (2ry area of Pain) (Left)   3. Chronic neck pain (3ry area of Pain) (Bilateral) (L>R)   4. Chronic pain syndrome   5. Pharmacologic therapy   6. Chronic use of opiate for therapeutic purpose   7. Encounter for medication management      Updated Problems: No problems updated.  Plan of Care  Problem-specific:  No problem-specific Assessment & Plan notes found for this encounter.  Ms. DEVYNE HAUGER has a current medication list which includes the following long-term medication(s): albuterol, atorvastatin, fluticasone, [START ON 01/06/2021] hydrocodone-acetaminophen, [START ON 02/05/2021] hydrocodone-acetaminophen, [START ON 03/07/2021]  hydrocodone-acetaminophen, metaxalone, metformin, montelukast, and spironolactone.  Pharmacotherapy (Medications Ordered): Meds ordered this encounter  Medications   HYDROcodone-acetaminophen (NORCO/VICODIN) 5-325 MG tablet    Sig: Take 0.5-1 tablets by mouth 2 (two) times daily as needed for severe pain. Must last 30 days    Dispense:  60 tablet    Refill:  0    DO NOT: delete (not duplicate); no partial-fill (will deny script to complete), no refill request (F/U required). DISPENSE: 1 day early if closed on fill date. WARN: No CNS-depressants within 8 hrs of med.   HYDROcodone-acetaminophen (NORCO/VICODIN) 5-325 MG tablet    Sig: Take 0.5-1 tablets by mouth 2 (two) times daily as needed for severe pain. Must last 30 days    Dispense:  60 tablet    Refill:  0    DO NOT: delete (not duplicate); no partial-fill (will deny script to complete), no refill request (F/U required). DISPENSE: 1 day early if closed on fill date. WARN: No CNS-depressants within 8 hrs of med.    Orders:  No orders of the defined types were placed in this encounter.  Follow-up plan:   Return in about 15 weeks (around 04/06/2021) for Eval-day (M,W), (F2F), (MM).     Interventional management options: Planned, scheduled, and/or pending:      Considering:   Diagnostic bilateral lumbar facet block  Possible bilateral lumbar facet RFA.  Diagnostic left L3-4 LESI  Diagnostic left L3 TFESI  Diagnostic left L4 TFESI  Diagnostic bilateral L5 TFESI  Diagnostic left CESI  Diagnostic bilateral cervical facet block  Possible bilateral cervical facet RFA.    Palliative PRN treatment(s):   None at this time    Recent Visits Date Type Provider Dept  10/15/20 Office Visit Milinda Pointer, MD [facility]  Showing recent visits within past 90 days and meeting all other requirements Today's Visits Date Type Provider Dept  12/22/20 Office Visit Milinda Pointer, MD [facility]  Showing today's  visits and meeting all other requirements Future Appointments No visits were found meeting these conditions. Showing future appointments within next 90 days and meeting all other requirements I discussed the assessment and treatment plan with the patient. The patient was provided an opportunity to ask questions and all were answered. The patient agreed with the plan and demonstrated an understanding of the instructions.  Patient advised to call back or seek an in-person evaluation if the symptoms or  condition worsens.  Duration of encounter: 30 minutes.  Note by: Gaspar Cola, MD Date: 12/22/2020; Time: 9:58 AM

## 2020-12-22 ENCOUNTER — Ambulatory Visit: Payer: Managed Care, Other (non HMO) | Attending: Pain Medicine | Admitting: Pain Medicine

## 2020-12-22 ENCOUNTER — Encounter: Payer: Self-pay | Admitting: Pain Medicine

## 2020-12-22 ENCOUNTER — Other Ambulatory Visit: Payer: Self-pay

## 2020-12-22 VITALS — BP 140/84 | HR 91 | Temp 96.6°F | Resp 16 | Ht 64.0 in | Wt 234.0 lb

## 2020-12-22 DIAGNOSIS — Z79899 Other long term (current) drug therapy: Secondary | ICD-10-CM

## 2020-12-22 DIAGNOSIS — Z79891 Long term (current) use of opiate analgesic: Secondary | ICD-10-CM

## 2020-12-22 DIAGNOSIS — M542 Cervicalgia: Secondary | ICD-10-CM

## 2020-12-22 DIAGNOSIS — G894 Chronic pain syndrome: Secondary | ICD-10-CM | POA: Diagnosis not present

## 2020-12-22 DIAGNOSIS — M545 Low back pain, unspecified: Secondary | ICD-10-CM | POA: Diagnosis not present

## 2020-12-22 DIAGNOSIS — M79605 Pain in left leg: Secondary | ICD-10-CM

## 2020-12-22 DIAGNOSIS — G8929 Other chronic pain: Secondary | ICD-10-CM

## 2020-12-22 MED ORDER — HYDROCODONE-ACETAMINOPHEN 5-325 MG PO TABS: 0.5000 | ORAL_TABLET | Freq: Two times a day (BID) | ORAL | 0 refills | Status: DC | PRN

## 2020-12-22 NOTE — Progress Notes (Signed)
Nursing Pain Medication Assessment:  Safety precautions to be maintained throughout the outpatient stay will include: orient to surroundings, keep bed in low position, maintain call bell within reach at all times, provide assistance with transfer out of bed and ambulation.  Medication Inspection Compliance: Pill count conducted under aseptic conditions, in front of the patient. Neither the pills nor the bottle was removed from the patient's sight at any time. Once count was completed pills were immediately returned to the patient in their original bottle.  Medication: Hydrocodone/APAP Pill/Patch Count:  41 of 60 pills remain Pill/Patch Appearance: Markings consistent with prescribed medication Bottle Appearance: Standard pharmacy container. Clearly labeled. Filled Date: 62 / 21 / 2022 Last Medication intake:  Today Safety precautions to be maintained throughout the outpatient stay will include: orient to surroundings, keep bed in low position, maintain call bell within reach at all times, provide assistance with transfer out of bed and ambulation.

## 2020-12-22 NOTE — Patient Instructions (Signed)
____________________________________________________________________________________________  Medication Rules  Purpose: To inform patients, and their family members, of our rules and regulations.  Applies to: All patients receiving prescriptions (written or electronic).  Pharmacy of record: Pharmacy where electronic prescriptions will be sent. If written prescriptions are taken to a different pharmacy, please inform the nursing staff. The pharmacy listed in the electronic medical record should be the one where you would like electronic prescriptions to be sent.  Electronic prescriptions: In compliance with the [place] Strengthen Opioid Misuse Prevention (STOP) Act of 2017 (Session Law 2017-74/H243), effective February 22, 2018, all controlled substances must be electronically prescribed. Calling prescriptions to the pharmacy will cease to exist.  Prescription refills: Only during scheduled appointments. Applies to all prescriptions.  NOTE: The following applies primarily to controlled substances (Opioid* Pain Medications).   Type of encounter (visit): For patients receiving controlled substances, face-to-face visits are required. (Not an option or up to the patient.)  Patient's responsibilities: Pain Pills: Bring all pain pills to every appointment (except for procedure appointments). Pill Bottles: Bring pills in original pharmacy bottle. Always bring the newest bottle. Bring bottle, even if empty. Medication refills: You are responsible for knowing and keeping track of what medications you take and those you need refilled. The day before your appointment: write a list of all prescriptions that need to be refilled. The day of the appointment: give the list to the admitting nurse. Prescriptions will be written only during appointments. No prescriptions will be written on procedure days. If you forget a medication: it will not be "Called in", "Faxed", or "electronically sent". You will  need to get another appointment to get these prescribed. No early refills. Do not call asking to have your prescription filled early. Prescription Accuracy: You are responsible for carefully inspecting your prescriptions before leaving our office. Have the discharge nurse carefully go over each prescription with you, before taking them home. Make sure that your name is accurately spelled, that your address is correct. Check the name and dose of your medication to make sure it is accurate. Check the number of pills, and the written instructions to make sure they are clear and accurate. Make sure that you are given enough medication to last until your next medication refill appointment. Taking Medication: Take medication as prescribed. When it comes to controlled substances, taking less pills or less frequently than prescribed is permitted and encouraged. Never take more pills than instructed. Never take medication more frequently than prescribed.  Inform other Doctors: Always inform, all of your healthcare providers, of all the medications you take. Pain Medication from other Providers: You are not allowed to accept any additional pain medication from any other Doctor or Healthcare provider. There are two exceptions to this rule. (see below) In the event that you require additional pain medication, you are responsible for notifying us, as stated below. Cough Medicine: Often these contain an opioid, such as codeine or hydrocodone. Never accept or take cough medicine containing these opioids if you are already taking an opioid* medication. The combination may cause respiratory failure and death. Medication Agreement: You are responsible for carefully reading and following our Medication Agreement. This must be signed before receiving any prescriptions from our practice. Safely store a copy of your signed Agreement. Violations to the Agreement will result in no further prescriptions. (Additional copies of our  Medication Agreement are available upon request.) Laws, Rules, & Regulations: All patients are expected to follow all Federal and State Laws, Statutes, Rules, & Regulations. Ignorance of   the Laws does not constitute a valid excuse.  Illegal drugs and Controlled Substances: The use of illegal substances (including, but not limited to marijuana and its derivatives) and/or the illegal use of any controlled substances is strictly prohibited. Violation of this rule may result in the immediate and permanent discontinuation of any and all prescriptions being written by our practice. The use of any illegal substances is prohibited. Adopted CDC guidelines & recommendations: Target dosing levels will be at or below 60 MME/day. Use of benzodiazepines** is not recommended.  Exceptions: There are only two exceptions to the rule of not receiving pain medications from other Healthcare Providers. Exception #1 (Emergencies): In the event of an emergency (i.e.: accident requiring emergency care), you are allowed to receive additional pain medication. However, you are responsible for: As soon as you are able, call our office [phone], at any time of the day or night, and leave a message stating your name, the date and nature of the emergency, and the name and dose of the medication prescribed. In the event that your call is answered by a member of our staff, make sure to document and save the date, time, and the name of the person that took your information.  Exception #2 (Planned Surgery): In the event that you are scheduled by another doctor or dentist to have any type of surgery or procedure, you are allowed (for a period no longer than 30 days), to receive additional pain medication, for the acute post-op pain. However, in this case, you are responsible for picking up a copy of our "Post-op Pain Management for Surgeons" handout, and giving it to your surgeon or dentist. This document is available at our office, and  does not require an appointment to obtain it. Simply go to our office during business hours (Monday-Thursday from 8:00 AM to 4:00 PM) (Friday 8:00 AM to 12:00 Noon) or if you have a scheduled appointment with us, prior to your surgery, and ask for it by name. In addition, you are responsible for: calling our office [phone], at any time of the day or night, and leaving a message stating your name, name of your surgeon, type of surgery, and date of procedure or surgery. Failure to comply with your responsibilities may result in termination of therapy involving the controlled substances. Medication Agreement Violation. Following the above rules, including your responsibilities will help you in avoiding a Medication Agreement Violation ("Breaking your Pain Medication Contract").  *Opioid medications include: morphine, codeine, oxycodone, oxymorphone, hydrocodone, hydromorphone, meperidine, tramadol, tapentadol, buprenorphine, fentanyl, methadone. **Benzodiazepine medications include: diazepam (Valium), alprazolam (Xanax), clonazepam (Klonopine), lorazepam (Ativan), clorazepate (Tranxene), chlordiazepoxide (Librium), estazolam (Prosom), oxazepam (Serax), temazepam (Restoril), triazolam (Halcion) (Last updated: 11/19/2020) ____________________________________________________________________________________________  ____________________________________________________________________________________________  Medication Recommendations and Reminders  Applies to: All patients receiving prescriptions (written and/or electronic).  Medication Rules & Regulations: These rules and regulations exist for your safety and that of others. They are not flexible and neither are we. Dismissing or ignoring them will be considered "non-compliance" with medication therapy, resulting in complete and irreversible termination of such therapy. (See document titled "Medication Rules" for more details.) In all conscience,  because of safety reasons, we cannot continue providing a therapy where the patient does not follow instructions.  Pharmacy of record:  Definition: This is the pharmacy where your electronic prescriptions will be sent.  We do not endorse any particular pharmacy, however, we have experienced problems with [facility] not securing enough medication supply for the community. We do not restrict you   in your choice of pharmacy. However, once we write for your prescriptions, we will NOT be re-sending more prescriptions to fix restricted supply problems created by your pharmacy, or your insurance.  The pharmacy listed in the electronic medical record should be the one where you want electronic prescriptions to be sent. If you choose to change pharmacy, simply notify our nursing staff.  Recommendations: Keep all of your pain medications in a safe place, under lock and key, even if you live alone. We will NOT replace lost, stolen, or damaged medication. After you fill your prescription, take 1 week's worth of pills and put them away in a safe place. You should keep a separate, properly labeled bottle for this purpose. The remainder should be kept in the original bottle. Use this as your primary supply, until it runs out. Once it's gone, then you know that you have 1 week's worth of medicine, and it is time to come in for a prescription refill. If you do this correctly, it is unlikely that you will ever run out of medicine. To make sure that the above recommendation works, it is very important that you make sure your medication refill appointments are scheduled at least 1 week before you run out of medicine. To do this in an effective manner, make sure that you do not leave the office without scheduling your next medication management appointment. Always ask the nursing staff to show you in your prescription , when your medication will be running out. Then arrange for the receptionist to get you a return appointment,  at least 7 days before you run out of medicine. Do not wait until you have 1 or 2 pills left, to come in. This is very poor planning and does not take into consideration that we may need to cancel appointments due to bad weather, sickness, or emergencies affecting our staff. DO NOT ACCEPT A "Partial Fill": If for any reason your pharmacy does not have enough pills/tablets to completely fill or refill your prescription, do not allow for a "partial fill". The law allows the pharmacy to complete that prescription within 72 hours, without requiring a new prescription. If they do not fill the rest of your prescription within those 72 hours, you will need a separate prescription to fill the remaining amount, which we will NOT provide. If the reason for the partial fill is your insurance, you will need to talk to the pharmacist about payment alternatives for the remaining tablets, but again, DO NOT ACCEPT A PARTIAL FILL, unless you can trust your pharmacist to obtain the remainder of the pills within 72 hours.  Prescription refills and/or changes in medication(s):  Prescription refills, and/or changes in dose or medication, will be conducted only during scheduled medication management appointments. (Applies to both, written and electronic prescriptions.) No refills on procedure days. No medication will be changed or started on procedure days. No changes, adjustments, and/or refills will be conducted on a procedure day. Doing so will interfere with the diagnostic portion of the procedure. No phone refills. No medications will be "called into the pharmacy". No Fax refills. No weekend refills. No Holliday refills. No after hours refills.  Remember:  Business hours are:  Monday to Thursday 8:00 AM to 4:00 PM Provider's Schedule: Demario Faniel, MD - Appointments are:  Medication management: Monday and Wednesday 8:00 AM to 4:00 PM Procedure day: Tuesday and Thursday 7:30 AM to 4:00 PM Bilal Lateef, MD -  Appointments are:  Medication management: Tuesday and Thursday 8:00   AM to 4:00 PM Procedure day: Monday and Wednesday 7:30 AM to 4:00 PM (Last update: 09/12/2019) ____________________________________________________________________________________________  ____________________________________________________________________________________________  CBD (cannabidiol) & Delta-8 (Delta-8 tetrahydrocannabinol) WARNING  Intro: Cannabidiol (CBD) and tetrahydrocannabinol (THC), are two natural compounds found in plants of the Cannabis genus. They can both be extracted from hemp or cannabis. Hemp and cannabis come from the Cannabis sativa plant. Both compounds interact with your body's endocannabinoid system, but they have very different effects. CBD does not produce the high sensation associated with cannabis. Delta-8 tetrahydrocannabinol, also known as delta-8 THC, is a psychoactive substance found in the Cannabis sativa plant, of which marijuana and hemp are two varieties. THC is responsible for the high associated with the illicit use of marijuana.  Applicable to: All individuals currently taking or considering taking CBD (cannabidiol) and, more important, all patients taking opioid analgesic controlled substances (pain medication). (Example: oxycodone; oxymorphone; hydrocodone; hydromorphone; morphine; methadone; tramadol; tapentadol; fentanyl; buprenorphine; butorphanol; dextromethorphan; meperidine; codeine; etc.)  Legal status: CBD remains a Schedule I drug prohibited for any use. CBD is illegal with one exception. In the [place], CBD has a limited Food and Drug Administration (FDA) approval for the treatment of two specific types of epilepsy disorders. Only one CBD product has been approved by the FDA for this purpose: "Epidiolex". FDA is aware that some companies are marketing products containing cannabis and cannabis-derived compounds in ways that violate the Federal Food, Drug and Cosmetic Act  (FD&C Act) and that may put the health and safety of consumers at risk. The FDA, a Federal agency, has not enforced the CBD status since 2018.   Legality: Some manufacturers ship CBD products nationally, which is illegal. Often such products are sold online and are therefore available throughout the country. CBD is openly sold in head shops and health food stores in some states where such sales have not been explicitly legalized. Selling unapproved products with unsubstantiated therapeutic claims is not only a violation of the law, but also can put patients at risk, as these products have not been proven to be safe or effective. Federal illegality makes it difficult to conduct research on CBD.  Reference: "FDA Regulation of Cannabis and Cannabis-Derived Products, Including Cannabidiol (CBD)" - https://www.fda.gov/news-events/public-health-focus/fda-regulation-cannabis-and-cannabis-derived-products-including-cannabidiol-cbd  Warning: CBD is not FDA approved and has not undergo the same manufacturing controls as prescription drugs.  This means that the purity and safety of available CBD may be questionable. Most of the time, despite manufacturer's claims, it is contaminated with THC (delta-9-tetrahydrocannabinol - the chemical in marijuana responsible for the "HIGH").  When this is the case, the THC contaminant will trigger a positive urine drug screen (UDS) test for Marijuana (carboxy-THC). Because a positive UDS for any illicit substance is a violation of our medication agreement, your opioid analgesics (pain medicine) may be permanently discontinued. The FDA recently put out a warning about 5 things that everyone should be aware of regarding Delta-8 THC: Delta-8 THC products have not been evaluated or approved by the FDA for safe use and may be marketed in ways that put the public health at risk. The FDA has received adverse event reports involving delta-8 THC-containing products. Delta-8 THC has  psychoactive and intoxicating effects. Delta-8 THC manufacturing often involve use of potentially harmful chemicals to create the concentrations of delta-8 THC claimed in the marketplace. The final delta-8 THC product may have potentially harmful by-products (contaminants) due to the chemicals used in the process. Manufacturing of delta-8 THC products may occur in uncontrolled or unsanitary settings, which may   lead to the presence of unsafe contaminants or other potentially harmful substances. Delta-8 THC products should be kept out of the reach of children and pets.  MORE ABOUT CBD  General Information: CBD was discovered in 1940 and it is a derivative of the cannabis sativa genus plants (Marijuana and Hemp). It is one of the 113 identified substances found in Marijuana. It accounts for up to 40% of the plant's extract. As of 2018, preliminary clinical studies on CBD included research for the treatment of anxiety, movement disorders, and pain. CBD is available and consumed in multiple forms, including inhalation of smoke or vapor, as an aerosol spray, and by mouth. It may be supplied as an oil containing CBD, capsules, dried cannabis, or as a liquid solution. CBD is thought not to be as psychoactive as THC (delta-9-tetrahydrocannabinol - the chemical in marijuana responsible for the "HIGH"). Studies suggest that CBD may interact with different biological target receptors in the body, including cannabinoid and other neurotransmitter receptors. As of 2018 the mechanism of action for its biological effects has not been determined.  Side-effects  Adverse reactions: Dry mouth, diarrhea, decreased appetite, fatigue, drowsiness, malaise, weakness, sleep disturbances, and others.  Drug interactions: CBC may interact with other medications such as blood-thinners. (Last update: 11/21/2020) ____________________________________________________________________________________________   

## 2021-01-15 ENCOUNTER — Telehealth: Payer: Self-pay | Admitting: Primary Care

## 2021-01-15 DIAGNOSIS — L680 Hirsutism: Secondary | ICD-10-CM

## 2021-01-15 DIAGNOSIS — E282 Polycystic ovarian syndrome: Secondary | ICD-10-CM

## 2021-01-15 DIAGNOSIS — R7303 Prediabetes: Secondary | ICD-10-CM

## 2021-01-15 DIAGNOSIS — J309 Allergic rhinitis, unspecified: Secondary | ICD-10-CM

## 2021-01-15 DIAGNOSIS — E781 Pure hyperglyceridemia: Secondary | ICD-10-CM

## 2021-01-19 NOTE — Telephone Encounter (Signed)
Pt requesting a refill stated she all out of medication  . Pt has a virtual schedule  Encourage patient to contact the pharmacy for refills or they can request refills through [employer]  LAST APPOINTMENT DATE:  Please schedule appointment if longer than 1 year  NEXT APPOINTMENT DATE:  MEDICATION:montelukast (SINGULAIR) 10 MG tablet   Is the patient out of medication?   PHARMACY:[id number] [address], [place] -    Let patient know to contact pharmacy at the end of the day to make sure medication is ready.  Please notify patient to allow 48-72 hours to process  CLINICAL FILLS OUT ALL BELOW:   LAST REFILL:  QTY:  REFILL DATE:    OTHER COMMENTS:    Okay for refill?  Please advise

## 2021-01-20 ENCOUNTER — Other Ambulatory Visit: Payer: Self-pay

## 2021-01-20 ENCOUNTER — Telehealth: Payer: Managed Care, Other (non HMO) | Admitting: Primary Care

## 2021-01-20 ENCOUNTER — Telehealth: Payer: Self-pay

## 2021-01-20 DIAGNOSIS — J309 Allergic rhinitis, unspecified: Secondary | ICD-10-CM

## 2021-01-20 MED ORDER — MONTELUKAST SODIUM 10 MG PO TABS: 10.0000 mg | ORAL_TABLET | Freq: Every day | ORAL | 0 refills | Status: DC

## 2021-01-20 NOTE — Telephone Encounter (Signed)
Patient called we have moved appointment to in office tomorrow. She is out of her singular and would like refill called in so that she can start back on it. Unpacked christmas decorations and has been having issues with allergies after that. She would like refill sent to [facility].

## 2021-01-20 NOTE — Telephone Encounter (Signed)
Patient needs in person appointment and has been scheduled for 01/21/21.

## 2021-01-20 NOTE — Addendum Note (Signed)
Addended by: Doreene Nest on: 01/20/2021 07:49 AM   Modules accepted: Orders

## 2021-01-20 NOTE — Telephone Encounter (Signed)
Noted, refill provided. Will evaluate patient tomorrow as scheduled.

## 2021-01-21 ENCOUNTER — Encounter: Payer: Self-pay | Admitting: Primary Care

## 2021-01-21 ENCOUNTER — Other Ambulatory Visit: Payer: Self-pay

## 2021-01-21 ENCOUNTER — Ambulatory Visit: Payer: Managed Care, Other (non HMO) | Admitting: Primary Care

## 2021-01-21 DIAGNOSIS — I10 Essential (primary) hypertension: Secondary | ICD-10-CM | POA: Diagnosis not present

## 2021-01-21 DIAGNOSIS — F411 Generalized anxiety disorder: Secondary | ICD-10-CM

## 2021-01-21 DIAGNOSIS — R7303 Prediabetes: Secondary | ICD-10-CM

## 2021-01-21 DIAGNOSIS — E282 Polycystic ovarian syndrome: Secondary | ICD-10-CM

## 2021-01-21 DIAGNOSIS — E781 Pure hyperglyceridemia: Secondary | ICD-10-CM | POA: Diagnosis not present

## 2021-01-21 DIAGNOSIS — K219 Gastro-esophageal reflux disease without esophagitis: Secondary | ICD-10-CM

## 2021-01-21 DIAGNOSIS — Z9884 Bariatric surgery status: Secondary | ICD-10-CM | POA: Insufficient documentation

## 2021-01-21 DIAGNOSIS — J4520 Mild intermittent asthma, uncomplicated: Secondary | ICD-10-CM | POA: Diagnosis not present

## 2021-01-21 DIAGNOSIS — J309 Allergic rhinitis, unspecified: Secondary | ICD-10-CM

## 2021-01-21 DIAGNOSIS — E042 Nontoxic multinodular goiter: Secondary | ICD-10-CM

## 2021-01-21 DIAGNOSIS — G894 Chronic pain syndrome: Secondary | ICD-10-CM

## 2021-01-21 DIAGNOSIS — L680 Hirsutism: Secondary | ICD-10-CM

## 2021-01-21 DIAGNOSIS — F5081 Binge eating disorder: Secondary | ICD-10-CM

## 2021-01-21 MED ORDER — ALBUTEROL SULFATE HFA 108 (90 BASE) MCG/ACT IN AERS: 2.0000 | INHALATION_SPRAY | Freq: Four times a day (QID) | RESPIRATORY_TRACT | 0 refills | Status: DC | PRN

## 2021-01-21 MED ORDER — MONTELUKAST SODIUM 10 MG PO TABS: 10.0000 mg | ORAL_TABLET | Freq: Every day | ORAL | 3 refills | Status: DC

## 2021-01-21 MED ORDER — SPIRONOLACTONE 25 MG PO TABS: ORAL_TABLET | ORAL | 3 refills | Status: DC

## 2021-01-21 MED ORDER — METFORMIN HCL ER 500 MG PO TB24: 500.0000 mg | ORAL_TABLET | Freq: Every day | ORAL | 3 refills | Status: DC

## 2021-01-21 MED ORDER — ATORVASTATIN CALCIUM 10 MG PO TABS: ORAL_TABLET | ORAL | 3 refills | Status: DC

## 2021-01-21 NOTE — Assessment & Plan Note (Signed)
Compliant to spironolactone 25 mg and metformin 500 mg daily.  Will change metformin to XR version at 500 mg.  Repeat A1C pending.

## 2021-01-21 NOTE — Assessment & Plan Note (Signed)
Intermittent, using omeprazole PRN, continue same.

## 2021-01-21 NOTE — Assessment & Plan Note (Signed)
Overall stable per patient.  Following with psychiatry and soon to be threapy.  Continue Lamictal 200 mg HS, lorazepam 1 mg PRN, Paroxetine CR 25 mg, Wellbutrin XL 300 mg daily.

## 2021-01-21 NOTE — Progress Notes (Signed)
Subjective:    Patient ID: Debra Myers, female    DOB: February 23, 1965, 55 y.o.   MRN: [id number]  HPI  Debra Myers is a very pleasant 55 y.o. female with a history of hypertension, asthma, PCOS, chronic back pain, chronic pain syndrome, TIA, anxiety, binge eating disorder who presents today for follow up of chronic conditions.   1) Hypertension: Currently managed on spironolactone 25 mg for hirsutism, but nothing else.   BP Readings from Last 3 Encounters:  01/21/21 118/64  12/22/20 140/84  10/15/20 129/67    2) GAD/Binge Eating Disorder: Currently managed on Vyvanse 40 mg, lorazepam 1 mg PRN, Lamictal 200 mg HS, Wellbutrin XL 300 mg daily, Paxil CR 25 mg. Following with psychiatry and will be starting therapy soon.   3) Hyperlipidemia: Currently managed on atorvastatin 10 mg. Due for repeat lipid panel.   4) Chronic Back Pain/Chronic Pain Syndrome: Following with pain management and is managed on Skelaxin 800 mg TID (prescribed by [name]), Norco 5-325 mg BID PRN.  Overall doing better, is now following with the chiropractor and exercising at the gym.   5) PCOS: Currently managed on spironolactone 25 mg, metformin 500 mg daily. She is not managed on XR version of metformin.   6) Allergic Rhinitis/Asthma: Chronic, intermittent, infrequent use of albuterol inhaler, is needing refills. Also managed on Zyrtec 10 mg and Singulair 10 mg daily.     Review of Systems  Eyes:  Negative for visual disturbance.  Respiratory:  Negative for shortness of breath.   Cardiovascular:  Negative for chest pain.  Allergic/Immunologic: Positive for environmental allergies.  Neurological:  Negative for dizziness and headaches.  Psychiatric/Behavioral:  The patient is not nervous/anxious.         Past Medical History:  Diagnosis Date   Anxiety    Asthma    ALLERGY INDUCED   Bulging lumbar disc (L3-4) 02/26/2015   Degenerative disc disease    Degenerative lumbar disc    Depression    Diabetes  mellitus    TYPE 2   Elective abortion    ONE   GERD (gastroesophageal reflux disease)    takes prilosec    Hypertension    Stroke (HCC) 06/11/2015   patient describes as mini stroke   Vaginal delivery    ONE NSVD    Social History   Socioeconomic History   Marital status: Married    Spouse name: Not on file   Number of children: Not on file   Years of education: Not on file   Highest education level: Not on file  Occupational History   Not on file  Tobacco Use   Smoking status: Never   Smokeless tobacco: Never  Vaping Use   Vaping Use: Never used  Substance and Sexual Activity   Alcohol use: Never    Alcohol/week: 0.0 standard drinks   Drug use: No   Sexual activity: Yes    Birth control/protection: I.U.D.    Comment: 1st intercourse 55 yo-Fewer than 5 partners-Mirena  inserted 09-2014  Other Topics Concern   Not on file  Social History Narrative   Not on file   Social Determinants of Health   Financial Resource Strain: Not on file  Food Insecurity: Not on file  Transportation Needs: Not on file  Physical Activity: Not on file  Stress: Not on file  Social Connections: Not on file  Intimate Partner Violence: Not on file    Past Surgical History:  Procedure Laterality Date   CHOLECYSTECTOMY  [place] N/A 09/10/2014   Procedure: DILATATION & CURETTAGE/HYSTEROSCOPY WITH MYOSURE/INSERTION OF IUD;  Surgeon: Terrance Mass, MD;  Location: [facility];  Service: Gynecology;  Laterality: N/A;   lap band surgery     Lapband  04/2012   mirena     inserted 09-2014   WISDOM TOOTH EXTRACTION      Family History  Problem Relation Age of Onset   Hypertension Father    Diabetes Father    Hypertension Mother    Asthma Maternal Grandmother    Stroke Maternal Grandmother    Stroke Maternal Grandfather     Allergies  Allergen Reactions   Oxycodone Itching   Penicillins Other (See Comments)    Reaction:  GI upset  Has  patient had a PCN reaction causing immediate rash, facial/tongue/throat swelling, SOB or lightheadedness with hypotension: No Has patient had a PCN reaction causing severe rash involving mucus membranes or skin necrosis: No Has patient had a PCN reaction that required hospitalization No Has patient had a PCN reaction occurring within the last 10 years: No If all of the above answers are "NO", then may proceed with Cephalosporin use.   Red Dye     Numb tongue   Sulfa Antibiotics Itching and Rash    Current Outpatient Medications on File Prior to Visit  Medication Sig Dispense Refill   albuterol (VENTOLIN HFA) 108 (90 Base) MCG/ACT inhaler INHALE 2 PUFFS BY MOUTH INTO THE LUNGS EVERY 6 HOURS AS NEEDED FOR WHEEZING OR SHORTNESS OF BREATH 18 g 1   atorvastatin (LIPITOR) 10 MG tablet TAKE 1 TABLET(10 MG) BY MOUTH DAILY for cholesterol. Office visit required for further refills. 30 tablet 0   buPROPion (WELLBUTRIN XL) 300 MG 24 hr tablet Take 300 mg by mouth daily.     cetirizine (ZYRTEC) 10 MG tablet Take 10 mg by mouth at bedtime.     fluticasone (FLONASE) 50 MCG/ACT nasal spray SHAKE LIQUID AND USE 1 SPRAY IN EACH NOSTRIL TWICE DAILY AS NEEDED FOR ALLERGIES OR RHINITIS 16 g 3   HYDROcodone-acetaminophen (NORCO/VICODIN) 5-325 MG tablet Take 0.5-1 tablets by mouth 2 (two) times daily as needed for severe pain. Must last 30 days 60 tablet 0   [START ON 02/05/2021] HYDROcodone-acetaminophen (NORCO/VICODIN) 5-325 MG tablet Take 0.5-1 tablets by mouth 2 (two) times daily as needed for severe pain. Must last 30 days 60 tablet 0   [START ON 03/07/2021] HYDROcodone-acetaminophen (NORCO/VICODIN) 5-325 MG tablet Take 0.5-1 tablets by mouth 2 (two) times daily as needed for severe pain. Must last 30 days 60 tablet 0   ibuprofen (ADVIL) 200 MG tablet Take 400 mg by mouth every 8 (eight) hours as needed.     lamoTRIgine (LAMICTAL) 200 MG tablet Take 300 mg by mouth at bedtime.      LORazepam (ATIVAN) 1 MG tablet  Take 0.5-1 mg by mouth every 8 (eight) hours as needed for anxiety or sleep.      metaxalone (SKELAXIN) 800 MG tablet Take 1 tablet (800 mg total) by mouth 3 (three) times daily as needed for muscle spasms. Office visit required for further refills. 90 tablet 0   montelukast (SINGULAIR) 10 MG tablet Take 1 tablet (10 mg total) by mouth at bedtime. For allergies. Office visit required for further refills. 30 tablet 0   PARoxetine (PAXIL-CR) 25 MG 24 hr tablet Take 50 mg by mouth at bedtime.      RA ASPIRIN EC ADULT LOW ST 81 MG EC  tablet take 1 tablet by mouth once daily 30 tablet 0   spironolactone (ALDACTONE) 25 MG tablet TAKE 1 TABLET(25 MG) BY MOUTH DAILY 90 tablet 1   VYVANSE 40 MG capsule Take 40 mg by mouth daily.  0   [DISCONTINUED] metFORMIN (GLUCOPHAGE) 500 MG tablet Take 1 tablet (500 mg total) by mouth daily with breakfast. For blood sugar. Office visit required for further refills. 30 tablet 0   No current facility-administered medications on file prior to visit.    BP 118/64   Pulse 68   Temp 97.6 F (36.4 C) (Temporal)   Ht 5\' 4"  (1.626 m)   Wt 232 lb (105.2 kg)   SpO2 97%   BMI 39.82 kg/m  Objective:   Physical Exam Cardiovascular:     Rate and Rhythm: Normal rate and regular rhythm.  Pulmonary:     Effort: Pulmonary effort is normal.     Breath sounds: Normal breath sounds.  Musculoskeletal:     Cervical back: Neck supple.  Skin:    General: Skin is warm and dry.  Psychiatric:        Mood and Affect: Mood normal.          Assessment & Plan:      This visit occurred during the SARS-CoV-2 public health emergency.  Safety protocols were in place, including screening questions prior to the visit, additional usage of staff PPE, and extensive cleaning of exam room while observing appropriate contact time as indicated for disinfecting solutions.

## 2021-01-21 NOTE — Assessment & Plan Note (Signed)
Well controlled in the office today, continue spironolactone 25 mg for which is mostly used for PCOS.  CMP pending.

## 2021-01-21 NOTE — Assessment & Plan Note (Signed)
Stable, infrequent use of albuterol inhaler.  Continue albuterol inhaler, Zyrtec 10 mg, and Singulair 10 mg daily.  Refills provided.

## 2021-01-21 NOTE — Assessment & Plan Note (Signed)
Doing well on Vyvanse 40 mg, continue same.  Following with psychiatry.

## 2021-01-21 NOTE — Assessment & Plan Note (Signed)
Will change metformin to XR version at 500 mg.  Repeat A1C pending.

## 2021-01-21 NOTE — Patient Instructions (Addendum)
      Stop by the lab prior to leaving today. I will notify you of your results once received.   Schedule a nurse visit in 2-6 months for your second shingles vaccine.  It was a pleasure to see you today!

## 2021-01-21 NOTE — Assessment & Plan Note (Addendum)
Thyroid labs historically stable. Repeat labs pending.  Patient endorses that she does not need follow up US of thyroid. Cannot find report in Epic.

## 2021-01-21 NOTE — Assessment & Plan Note (Signed)
Following with pain management.  Continue Norco 5-325 mg BID PRN, Skelaxin 800 mg TID PRN.

## 2021-01-21 NOTE — Assessment & Plan Note (Signed)
Doing well on Zyrtec 10 mg and Singulair 10 mg, continue same.

## 2021-01-21 NOTE — Assessment & Plan Note (Signed)
Doing well on spironolactone 25 mg, continue same.

## 2021-01-21 NOTE — Assessment & Plan Note (Signed)
In 2014.  Checking B12 and vitamin D labs.

## 2021-01-22 LAB — COMPREHENSIVE METABOLIC PANEL
ALT: 18 U/L (ref 0–35)
AST: 16 U/L (ref 0–37)
Albumin: 4.4 g/dL (ref 3.5–5.2)
Alkaline Phosphatase: 104 U/L (ref 39–117)
BUN: 13 mg/dL (ref 6–23)
CO2: 30 mEq/L (ref 19–32)
Calcium: 9.2 mg/dL (ref 8.4–10.5)
Chloride: 104 mEq/L (ref 96–112)
Creatinine, Ser: 0.92 mg/dL (ref 0.40–1.20)
GFR: 70.59 mL/min (ref >60.00)
Glucose, Bld: 69 mg/dL — ABNORMAL LOW (ref 70–99)
Potassium: 4.3 mEq/L (ref 3.5–5.1)
Sodium: 141 mEq/L (ref 135–145)
Total Bilirubin: 0.4 mg/dL (ref 0.2–1.2)
Total Protein: 7 g/dL (ref 6.0–8.3)

## 2021-01-22 LAB — HEMOGLOBIN A1C: Hgb A1c MFr Bld: 6.1 % (ref 4.6–6.5)

## 2021-01-22 LAB — VITAMIN D 25 HYDROXY (VIT D DEFICIENCY, FRACTURES): VITD: 25.55 ng/mL — ABNORMAL LOW (ref 30.00–100.00)

## 2021-01-22 LAB — LIPID PANEL
Cholesterol: 153 mg/dL (ref 0–200)
HDL: 55.2 mg/dL (ref >39.00)
LDL Cholesterol: 79 mg/dL (ref 0–99)
NonHDL: 98.18
Total CHOL/HDL Ratio: 3
Triglycerides: 95 mg/dL (ref 0.0–149.0)
VLDL: 19 mg/dL (ref 0.0–40.0)

## 2021-01-22 LAB — T4, FREE: Free T4: 0.75 ng/dL (ref 0.60–1.60)

## 2021-01-22 LAB — TSH: TSH: 0.57 u[IU]/mL (ref 0.35–5.50)

## 2021-01-22 LAB — VITAMIN B12: Vitamin B-12: 201 pg/mL — ABNORMAL LOW (ref 211–911)

## 2021-02-01 ENCOUNTER — Other Ambulatory Visit: Payer: Self-pay | Admitting: Primary Care

## 2021-02-01 DIAGNOSIS — M62838 Other muscle spasm: Secondary | ICD-10-CM

## 2021-02-01 DIAGNOSIS — M7918 Myalgia, other site: Secondary | ICD-10-CM

## 2021-02-02 DIAGNOSIS — Z23 Encounter for immunization: Secondary | ICD-10-CM

## 2021-02-02 NOTE — Addendum Note (Signed)
Addended by: Donnamarie Poag on: 02/02/2021 08:12 AM   Modules accepted: Orders

## 2021-02-05 DIAGNOSIS — M7918 Myalgia, other site: Secondary | ICD-10-CM

## 2021-02-05 DIAGNOSIS — M62838 Other muscle spasm: Secondary | ICD-10-CM

## 2021-02-05 MED ORDER — METAXALONE 800 MG PO TABS: 800.0000 mg | ORAL_TABLET | Freq: Three times a day (TID) | ORAL | 0 refills | Status: DC | PRN

## 2021-02-17 ENCOUNTER — Other Ambulatory Visit: Payer: Self-pay | Admitting: Pain Medicine

## 2021-02-17 ENCOUNTER — Telehealth: Payer: Self-pay | Admitting: Pain Medicine

## 2021-02-17 DIAGNOSIS — M79605 Pain in left leg: Secondary | ICD-10-CM

## 2021-02-17 DIAGNOSIS — G8929 Other chronic pain: Secondary | ICD-10-CM

## 2021-02-17 DIAGNOSIS — Z79891 Long term (current) use of opiate analgesic: Secondary | ICD-10-CM

## 2021-02-17 DIAGNOSIS — M542 Cervicalgia: Secondary | ICD-10-CM

## 2021-02-17 DIAGNOSIS — Z79899 Other long term (current) drug therapy: Secondary | ICD-10-CM

## 2021-02-17 DIAGNOSIS — G894 Chronic pain syndrome: Secondary | ICD-10-CM

## 2021-02-17 MED ORDER — HYDROCODONE-ACETAMINOPHEN 5-325 MG PO TABS: 0.5000 | ORAL_TABLET | Freq: Two times a day (BID) | ORAL | 0 refills | Status: DC | PRN

## 2021-02-17 NOTE — Telephone Encounter (Signed)
Attempted to call [facility], do not open until 9

## 2021-02-17 NOTE — Telephone Encounter (Signed)
Patient called to refill meds and was told by pharmacy they do not have any scripts to fill. Looks like she should still have scripts to fill. Please call pharmacy and patient.

## 2021-02-17 NOTE — Telephone Encounter (Signed)
Spoke with patient to let her know that [facility] has resent the Rx's through to fill today, 03/19/21 and 04/18/21.  I did ask patient if it would be alright for [facility] to contact [facility] about Rx's not going through when clearly they have been sent and showed "receipt confirmed by pharmacy".    Patient to call back to reschedule her medication appt for just prior to 05/18/21.  Will need to cancel appt in February.

## 2021-04-01 ENCOUNTER — Encounter: Payer: Managed Care, Other (non HMO) | Admitting: Pain Medicine

## 2021-04-03 ENCOUNTER — Other Ambulatory Visit: Payer: Self-pay | Admitting: Primary Care

## 2021-04-03 DIAGNOSIS — M7918 Myalgia, other site: Secondary | ICD-10-CM

## 2021-04-03 DIAGNOSIS — M62838 Other muscle spasm: Secondary | ICD-10-CM

## 2021-04-08 DIAGNOSIS — M7918 Myalgia, other site: Secondary | ICD-10-CM

## 2021-04-08 DIAGNOSIS — M62838 Other muscle spasm: Secondary | ICD-10-CM

## 2021-04-10 MED ORDER — METAXALONE 800 MG PO TABS: 800.0000 mg | ORAL_TABLET | Freq: Three times a day (TID) | ORAL | 0 refills | Status: DC | PRN

## 2021-04-25 NOTE — Progress Notes (Signed)
PROVIDER NOTE: Information contained herein reflects review and annotations entered in association with encounter. Interpretation of such information and data should be left to medically-trained personnel. Information provided to patient can be located elsewhere in the medical record under "Patient Instructions". Document created using STT-dictation technology, any transcriptional errors that may result from process are unintentional.    Patient: Debra Myers  Service Category: E/M  Provider: Gaspar Cola, MD  DOB: 01/29/1966  DOS: 04/27/2021  Specialty: Interventional Pain Management  MRN: [id number]  Setting: Ambulatory outpatient  PCP: Pleas Koch, NP  Type: Established Patient    Referring Provider: Pleas Koch, NP  Location: Office  Delivery: Face-to-face     HPI  [name], a 56 y.o. year old female, is here today because of her Chronic pain syndrome [G89.4]. [name] primary complain today is Back Pain (Right, lower) Last encounter: My last encounter with her was on 02/17/2021. Pertinent problems: [name] has Numbness of upper extremity; Radiculitis involving upper extremity; Chronic low back pain (1ry area of Pain) (Bilateral) (L>R); Lumbar spondylosis (Bulging Disc & Severe Left Foraminal Stenosis at L3-4); Chronic lower extremity pain (2ry area of Pain) (Left); Lumbar foraminal stenosis (Severe Left L3-4); Lumbar facet syndrome (Bilateral) (L>R); Cervical spondylosis (C5-6 & C6-7 DDD); Cervical (3 mm) Grade 1 Anterolisthesis of C4 over C5; Chronic neck pain (3ry area of Pain) (Bilateral) (L>R); Chronic cervical radicular pain (Bilateral) (L>R); Cervical foraminal stenosis (multilevel) (Bilateral); Muscle spasm, nocturnal; Musculoskeletal pain; Carpal tunnel syndrome (Bilateral) (L>R); Cervical facet syndrome (Bilateral) (L>R); Chronic pain syndrome; DDD (degenerative disc disease), lumbar; DDD (degenerative disc disease), cervical; Pain in right knee; Chronic pain  of left knee; Osteoarthritis involving multiple joints; and Acute exacerbation of chronic low back pain on their pertinent problem list. Pain Assessment: Severity of Chronic pain is reported as a 1 /10. Location: Back Lower, Right, Left/right leg  to the mid thigh, sometimes left leg to the calf. Onset: More than a month ago. Quality: Aching, Burning. Timing: Intermittent. Modifying factor(s): walking, exercising, relaxation, medication. Vitals:  height is '5\' 4"'  (1.626 m) and weight is 240 lb (108.9 kg). Her temporal temperature is 97.2 F (36.2 C) (abnormal). Her blood pressure is 139/87 and her pulse is 89. Her respiration is 16 and oxygen saturation is 100%.   Reason for encounter: medication management.   The patient indicates doing well with the current medication regimen. No adverse reactions or side effects reported to the medications.   RTCB: 08/16/2021 Nonopioids transferred to 12/24/2019: Skelaxin  Pharmacotherapy Assessment  Analgesic: Hydrocodone/APAP 5/325, 2 tab PO QD (10 mg/day of hydrocodone) MME/day: 10 mg/day.   Monitoring: [place] PMP: PDMP reviewed during this encounter.       Pharmacotherapy: No side-effects or adverse reactions reported. Compliance: No problems identified. Effectiveness: Clinically acceptable.  Landis Martins, RN  04/27/2021  9:56 AM  Sign when Signing Visit Nursing Pain Medication Assessment:  Safety precautions to be maintained throughout the outpatient stay will include: orient to surroundings, keep bed in low position, maintain call bell within reach at all times, provide assistance with transfer out of bed and ambulation.  Medication Inspection Compliance: Pill count conducted under aseptic conditions, in front of the patient. Neither the pills nor the bottle was removed from the patient's sight at any time. Once count was completed pills were immediately returned to the patient in their original bottle.  Medication: Hydrocodone/APAP Pill/Patch Count:   01 of 60 pills remain Pill/Patch Appearance: Markings consistent with  prescribed medication Bottle Appearance: Standard pharmacy container. Clearly labeled. Filled Date: 01 / 30 / 2023 Last Medication intake:  Today    UDS:  Summary  Date Value Ref Range Status  07/28/2020 Note  Final    Comment:    ==================================================================== [employer] (MW) ==================================================================== Test                             Result       Flag       Units  Drug Present and Declared for Prescription Verification   Amphetamine                    343          EXPECTED   ng/mg creat    Amphetamine is available as a schedule II prescription drug.    Lorazepam                      476          EXPECTED   ng/mg creat    Source of lorazepam is a scheduled prescription medication.    Hydrocodone                    321          EXPECTED   ng/mg creat   Dihydrocodeine                 66           EXPECTED   ng/mg creat   Norhydrocodone                 802          EXPECTED   ng/mg creat    Sources of hydrocodone include scheduled prescription medications.    Dihydrocodeine and norhydrocodone are expected metabolites of    hydrocodone. Dihydrocodeine is also available as a scheduled    prescription medication.  ==================================================================== Test                      Result    Flag   Units      Ref Range   Creatinine              92               mg/dL      >=20 ==================================================================== Declared Medications:  The flagging and interpretation on this report are based on the  following declared medications.  Unexpected results may arise from  inaccuracies in the declared medications.   **Note: The testing scope of this panel includes these medications:   Amphetamine (Vyvanse)  Hydrocodone (Norco)  Lorazepam (Ativan)   **Note: The testing  scope of this panel does not include the  following reported medications:   Acetaminophen (Norco)  Albuterol (Ventolin HFA)  Aspirin  Atorvastatin (Lipitor)  Bupropion (Wellbutrin)  Cetirizine (Zyrtec)  Fluticasone (Flonase)  Ibuprofen (Advil)  Lamotrigine (Lamictal)  Metaxalone (Skelaxin)  Metformin  Montelukast (Singulair)  Paroxetine (Paxil)  Spironolactone (Aldactone) ==================================================================== For clinical consultation, please call [phone]. ====================================================================      ROS  Constitutional: Denies any fever or chills Gastrointestinal: No reported hemesis, hematochezia, vomiting, or acute GI distress Musculoskeletal: Denies any acute onset joint swelling, redness, loss of ROM, or weakness Neurological: No reported episodes of acute onset apraxia, aphasia, dysarthria, agnosia, amnesia, paralysis, loss of coordination, or loss of  consciousness  Medication Review  HYDROcodone-acetaminophen, LORazepam, PARoxetine, albuterol, aspirin, atorvastatin, buPROPion, cetirizine, fluticasone, ibuprofen, lamoTRIgine, lisdexamfetamine, metFORMIN, metaxalone, montelukast, and spironolactone  History Review  Allergy: [name] is allergic to oxycodone, penicillins, red dye, and sulfa antibiotics. Drug: [name]  reports no history of drug use. Alcohol:  reports no history of alcohol use. Tobacco:  reports that she has never smoked. She has never used smokeless tobacco. Social: [name]  reports that she has never smoked. She has never used smokeless tobacco. She reports that she does not drink alcohol and does not use drugs. Medical:  has a past medical history of Anxiety, Asthma, Bulging lumbar disc (L3-4) (02/26/2015), Degenerative disc disease, Degenerative lumbar disc, Depression, Diabetes mellitus, Elective abortion, GERD (gastroesophageal reflux disease), Hypertension, Stroke ([place]) (06/11/2015),  and Vaginal delivery. Surgical: Ms. Bujak  has a past surgical history that includes Cholecystectomy (2001); Lapband (04/2012); Wisdom tooth extraction; Dilatation & curettage/hysteroscopy with myosure (N/A, 09/10/2014); mirena; and lap band surgery. Family: family history includes Asthma in her maternal grandmother; Diabetes in her father; Hypertension in her father and mother; Stroke in her maternal grandfather and maternal grandmother.  Laboratory Chemistry Profile   Renal Lab Results  Component Value Date   BUN 13 01/21/2021   CREATININE 0.92 32/35/5732   BCR NOT APPLICABLE 20/25/4270   GFR 70.59 01/21/2021   GFRAA >60 12/16/2017   GFRNONAA >60 12/16/2017    Hepatic Lab Results  Component Value Date   AST 16 01/21/2021   ALT 18 01/21/2021   ALBUMIN 4.4 01/21/2021   ALKPHOS 104 01/21/2021   HCVAB NEGATIVE 05/18/2013    Electrolytes Lab Results  Component Value Date   NA 141 01/21/2021   K 4.3 01/21/2021   CL 104 01/21/2021   CALCIUM 9.2 01/21/2021   PHOS 4.0 10/31/2015    Bone Lab Results  Component Value Date   VD25OH 25.55 (L) 01/21/2021    Inflammation (CRP: Acute Phase) (ESR: Chronic Phase) Lab Results  Component Value Date   ESRSEDRATE 32 05/26/2017         Note: Above Lab results reviewed.  Recent Imaging Review  VAS [name] LOWER EXTREMITY VENOUS (DVT) ([facility] and [facility] 7a-7p)  Lower Venous Study  Indications: Pain.   Performing Technologist: Maudry Mayhew MHA, RDMS, RVT, RDCS    Examination Guidelines: A complete evaluation includes B-mode imaging, spectral Doppler, color Doppler, and power Doppler as needed of all accessible portions of each vessel. Bilateral testing is considered an integral part of a complete examination. Limited examinations for reoccurring indications may be performed as noted.    Right Venous Findings: +---+---------------+---------+-----------+----------+-------+       Compressibility Phasicity Spontaneity Properties Summary  +---+---------------+---------+-----------+----------+-------+  CFV Full            Yes       Yes                             +---+---------------+---------+-----------+----------+-------+     Left Venous Findings: +---------+---------------+---------+-----------+----------+-------+            Compressibility Phasicity Spontaneity Properties Summary  +---------+---------------+---------+-----------+----------+-------+  CFV       Full            Yes       Yes                             +---------+---------------+---------+-----------+----------+-------+  SFJ  Full                                                      +---------+---------------+---------+-----------+----------+-------+  FV Prox   Full                                                      +---------+---------------+---------+-----------+----------+-------+  FV Mid    Full                                                      +---------+---------------+---------+-----------+----------+-------+  FV Distal Full                                                      +---------+---------------+---------+-----------+----------+-------+  PFV       Full                                                      +---------+---------------+---------+-----------+----------+-------+  POP       Full            Yes       Yes                             +---------+---------------+---------+-----------+----------+-------+  PTV       Full                                                      +---------+---------------+---------+-----------+----------+-------+  PERO      Full                                                      +---------+---------------+---------+-----------+----------+-------+          Summary: Right: No evidence of common femoral vein obstruction. Left: There is no evidence of deep vein thrombosis in the lower extremity. No cystic structure found in the  popliteal fossa.   *See table(s) above for measurements and observations.  Electronically signed by Monica Martinez MD on 12/16/2017 at 6:04:46 PM.      Final   Note: Reviewed        Physical Exam  General appearance: Well nourished, well developed, and well hydrated. In no apparent acute distress Mental status: Alert, oriented x 3 (person, place, & time)       Respiratory: No evidence of acute respiratory distress Eyes: PERLA Vitals:  BP 139/87    Pulse 89    Temp (!) 97.2 F (36.2 C) (Temporal)    Resp 16    Ht '5\' 4"'  (1.626 m)    Wt 240 lb (108.9 kg)    SpO2 100%    BMI 41.20 kg/m  BMI: Estimated body mass index is 41.2 kg/m as calculated from the following:   Height as of this encounter: '5\' 4"'  (1.626 m).   Weight as of this encounter: 240 lb (108.9 kg). Ideal: Ideal body weight: 54.7 kg (120 lb 9.5 oz) Adjusted ideal body weight: 76.4 kg (168 lb 5.7 oz)  Assessment   Status Diagnosis  Controlled Controlled Controlled 1. Chronic pain syndrome   2. Chronic low back pain (1ry area of Pain) (Bilateral) (L>R)   3. Chronic lower extremity pain (2ry area of Pain) (Left)   4. Chronic neck pain (3ry area of Pain) (Bilateral) (L>R)   5. Cervical facet syndrome (Bilateral) (L>R)   6. Chronic cervical radicular pain (Bilateral) (L>R)   7. Lumbar facet syndrome (Bilateral) (L>R)   8. Musculoskeletal pain   9. Pharmacologic therapy   10. Chronic use of opiate for therapeutic purpose   11. Encounter for medication management   12. Encounter for chronic pain management      Updated Problems: Problem  History of Bariatric Surgery    Plan of Care  Problem-specific:  No problem-specific Assessment & Plan notes found for this encounter.  [name] has a current medication list which includes the following long-term medication(s): albuterol, atorvastatin, fluticasone, [START ON 05/18/2021] hydrocodone-acetaminophen, [START ON 06/17/2021] hydrocodone-acetaminophen, [START ON  07/17/2021] hydrocodone-acetaminophen, metaxalone, metformin, montelukast, and spironolactone.  Pharmacotherapy (Medications Ordered): Meds ordered this encounter  Medications   HYDROcodone-acetaminophen (NORCO/VICODIN) 5-325 MG tablet    Sig: Take 0.5-1 tablets by mouth 2 (two) times daily as needed for severe pain. Must last 30 days    Dispense:  60 tablet    Refill:  0    DO NOT: delete (not duplicate); no partial-fill (will deny script to complete), no refill request (F/U required). DISPENSE: 1 day early if closed on fill date. WARN: No CNS-depressants within 8 hrs of med.   HYDROcodone-acetaminophen (NORCO/VICODIN) 5-325 MG tablet    Sig: Take 0.5-1 tablets by mouth 2 (two) times daily as needed for severe pain. Must last 30 days    Dispense:  60 tablet    Refill:  0    DO NOT: delete (not duplicate); no partial-fill (will deny script to complete), no refill request (F/U required). DISPENSE: 1 day early if closed on fill date. WARN: No CNS-depressants within 8 hrs of med.   HYDROcodone-acetaminophen (NORCO/VICODIN) 5-325 MG tablet    Sig: Take 0.5-1 tablets by mouth 2 (two) times daily as needed for severe pain. Must last 30 days    Dispense:  60 tablet    Refill:  0    DO NOT: delete (not duplicate); no partial-fill (will deny script to complete), no refill request (F/U required). DISPENSE: 1 day early if closed on fill date. WARN: No CNS-depressants within 8 hrs of med.   Orders:  No orders of the defined types were placed in this encounter.  Follow-up plan:   Return in about 4 months (around 08/16/2021) for Eval-day (M,W), (F2F), (MM).     Interventional management options: Planned, scheduled, and/or pending:      Considering:   Diagnostic bilateral lumbar facet block  Possible bilateral lumbar facet RFA.  Diagnostic left L3-4 LESI  Diagnostic left L3 TFESI  Diagnostic left L4 TFESI  Diagnostic bilateral L5 TFESI  Diagnostic left CESI  Diagnostic bilateral cervical facet  block  Possible bilateral cervical facet RFA.    Palliative PRN treatment(s):   None at this time     Recent Visits No visits were found meeting these conditions. Showing recent visits within past 90 days and meeting all other requirements Today's Visits Date Type Provider Dept  04/27/21 Office Visit Milinda Pointer, MD [facility]  Showing today's visits and meeting all other requirements Future Appointments No visits were found meeting these conditions. Showing future appointments within next 90 days and meeting all other requirements  I discussed the assessment and treatment plan with the patient. The patient was provided an opportunity to ask questions and all were answered. The patient agreed with the plan and demonstrated an understanding of the instructions.  Patient advised to call back or seek an in-person evaluation if the symptoms or condition worsens.  Duration of encounter: 30 minutes.  Note by: Gaspar Cola, MD Date: 04/27/2021; Time: 11:13 AM

## 2021-04-27 ENCOUNTER — Other Ambulatory Visit: Payer: Self-pay

## 2021-04-27 ENCOUNTER — Ambulatory Visit: Payer: Managed Care, Other (non HMO) | Attending: Pain Medicine | Admitting: Pain Medicine

## 2021-04-27 ENCOUNTER — Encounter: Payer: Self-pay | Admitting: Pain Medicine

## 2021-04-27 VITALS — BP 139/87 | HR 89 | Temp 97.2°F | Resp 16 | Ht 64.0 in | Wt 240.0 lb

## 2021-04-27 DIAGNOSIS — Z79899 Other long term (current) drug therapy: Secondary | ICD-10-CM | POA: Diagnosis present

## 2021-04-27 DIAGNOSIS — M542 Cervicalgia: Secondary | ICD-10-CM | POA: Diagnosis not present

## 2021-04-27 DIAGNOSIS — Z79891 Long term (current) use of opiate analgesic: Secondary | ICD-10-CM | POA: Diagnosis present

## 2021-04-27 DIAGNOSIS — G894 Chronic pain syndrome: Secondary | ICD-10-CM | POA: Diagnosis not present

## 2021-04-27 DIAGNOSIS — M47816 Spondylosis without myelopathy or radiculopathy, lumbar region: Secondary | ICD-10-CM

## 2021-04-27 DIAGNOSIS — G8929 Other chronic pain: Secondary | ICD-10-CM

## 2021-04-27 DIAGNOSIS — M7918 Myalgia, other site: Secondary | ICD-10-CM

## 2021-04-27 DIAGNOSIS — M79605 Pain in left leg: Secondary | ICD-10-CM | POA: Diagnosis not present

## 2021-04-27 DIAGNOSIS — M5412 Radiculopathy, cervical region: Secondary | ICD-10-CM | POA: Diagnosis present

## 2021-04-27 DIAGNOSIS — M47812 Spondylosis without myelopathy or radiculopathy, cervical region: Secondary | ICD-10-CM

## 2021-04-27 DIAGNOSIS — M545 Low back pain, unspecified: Secondary | ICD-10-CM

## 2021-04-27 MED ORDER — HYDROCODONE-ACETAMINOPHEN 5-325 MG PO TABS: 0.5000 | ORAL_TABLET | Freq: Two times a day (BID) | ORAL | 0 refills | Status: DC | PRN

## 2021-04-27 NOTE — Progress Notes (Signed)
Nursing Pain Medication Assessment:  ?Safety precautions to be maintained throughout the outpatient stay will include: orient to surroundings, keep bed in low position, maintain call bell within reach at all times, provide assistance with transfer out of bed and ambulation.  ?Medication Inspection Compliance: Pill count conducted under aseptic conditions, in front of the patient. Neither the pills nor the bottle was removed from the patient's sight at any time. Once count was completed pills were immediately returned to the patient in their original bottle. ? ?Medication: Hydrocodone/APAP ?Pill/Patch Count:  01 of 60 pills remain ?Pill/Patch Appearance: Markings consistent with prescribed medication ?Bottle Appearance: Standard pharmacy container. Clearly labeled. ?Filled Date: 01 / 30 / 2023 ?Last Medication intake:  Today ?

## 2021-04-27 NOTE — Patient Instructions (Signed)
____________________________________________________________________________________________ ° °Medication Rules ° °Purpose: To inform patients, and their family members, of our rules and regulations. ° °Applies to: All patients receiving prescriptions (written or electronic). ° °Pharmacy of record: Pharmacy where electronic prescriptions will be sent. If written prescriptions are taken to a different pharmacy, please inform the nursing staff. The pharmacy listed in the electronic medical record should be the one where you would like electronic prescriptions to be sent. ° °Electronic prescriptions: In compliance with the [place] Strengthen Opioid Misuse Prevention (STOP) Act of 2017 (Session Law 2017-74/H243), effective February 22, 2018, all controlled substances must be electronically prescribed. Calling prescriptions to the pharmacy will cease to exist. ° °Prescription refills: Only during scheduled appointments. Applies to all prescriptions. ° °NOTE: The following applies primarily to controlled substances (Opioid* Pain Medications).  ° °Type of encounter (visit): For patients receiving controlled substances, face-to-face visits are required. (Not an option or up to the patient.) ° °Patient's responsibilities: °Pain Pills: Bring all pain pills to every appointment (except for procedure appointments). °Pill Bottles: Bring pills in original pharmacy bottle. Always bring the newest bottle. Bring bottle, even if empty. °Medication refills: You are responsible for knowing and keeping track of what medications you take and those you need refilled. °The day before your appointment: write a list of all prescriptions that need to be refilled. °The day of the appointment: give the list to the admitting nurse. Prescriptions will be written only during appointments. No prescriptions will be written on procedure days. °If you forget a medication: it will not be "Called in", "Faxed", or "electronically sent". You will  need to get another appointment to get these prescribed. °No early refills. Do not call asking to have your prescription filled early. °Prescription Accuracy: You are responsible for carefully inspecting your prescriptions before leaving our office. Have the discharge nurse carefully go over each prescription with you, before taking them home. Make sure that your name is accurately spelled, that your address is correct. Check the name and dose of your medication to make sure it is accurate. Check the number of pills, and the written instructions to make sure they are clear and accurate. Make sure that you are given enough medication to last until your next medication refill appointment. °Taking Medication: Take medication as prescribed. When it comes to controlled substances, taking less pills or less frequently than prescribed is permitted and encouraged. °Never take more pills than instructed. °Never take medication more frequently than prescribed.  °Inform other Doctors: Always inform, all of your healthcare providers, of all the medications you take. °Pain Medication from other Providers: You are not allowed to accept any additional pain medication from any other Doctor or Healthcare provider. There are two exceptions to this rule. (see below) In the event that you require additional pain medication, you are responsible for notifying us, as stated below. °Cough Medicine: Often these contain an opioid, such as codeine or hydrocodone. Never accept or take cough medicine containing these opioids if you are already taking an opioid* medication. The combination may cause respiratory failure and death. °Medication Agreement: You are responsible for carefully reading and following our Medication Agreement. This must be signed before receiving any prescriptions from our practice. Safely store a copy of your signed Agreement. Violations to the Agreement will result in no further prescriptions. (Additional copies of our  Medication Agreement are available upon request.) °Laws, Rules, & Regulations: All patients are expected to follow all Federal and State Laws, Statutes, Rules, & Regulations. Ignorance of   the Laws does not constitute a valid excuse.  °Illegal drugs and Controlled Substances: The use of illegal substances (including, but not limited to marijuana and its derivatives) and/or the illegal use of any controlled substances is strictly prohibited. Violation of this rule may result in the immediate and permanent discontinuation of any and all prescriptions being written by our practice. The use of any illegal substances is prohibited. °Adopted CDC guidelines & recommendations: Target dosing levels will be at or below 60 MME/day. Use of benzodiazepines** is not recommended. ° °Exceptions: There are only two exceptions to the rule of not receiving pain medications from other Healthcare Providers. °Exception #1 (Emergencies): In the event of an emergency (i.e.: accident requiring emergency care), you are allowed to receive additional pain medication. However, you are responsible for: As soon as you are able, call our office [phone], at any time of the day or night, and leave a message stating your name, the date and nature of the emergency, and the name and dose of the medication prescribed. In the event that your call is answered by a member of our staff, make sure to document and save the date, time, and the name of the person that took your information.  °Exception #2 (Planned Surgery): In the event that you are scheduled by another doctor or dentist to have any type of surgery or procedure, you are allowed (for a period no longer than 30 days), to receive additional pain medication, for the acute post-op pain. However, in this case, you are responsible for picking up a copy of our "Post-op Pain Management for Surgeons" handout, and giving it to your surgeon or dentist. This document is available at our office, and  does not require an appointment to obtain it. Simply go to our office during business hours (Monday-Thursday from 8:00 AM to 4:00 PM) (Friday 8:00 AM to 12:00 Noon) or if you have a scheduled appointment with us, prior to your surgery, and ask for it by name. In addition, you are responsible for: calling our office [phone], at any time of the day or night, and leaving a message stating your name, name of your surgeon, type of surgery, and date of procedure or surgery. Failure to comply with your responsibilities may result in termination of therapy involving the controlled substances. °Medication Agreement Violation. Following the above rules, including your responsibilities will help you in avoiding a Medication Agreement Violation (“Breaking your Pain Medication Contract”). ° °*Opioid medications include: morphine, codeine, oxycodone, oxymorphone, hydrocodone, hydromorphone, meperidine, tramadol, tapentadol, buprenorphine, fentanyl, methadone. °**Benzodiazepine medications include: diazepam (Valium), alprazolam (Xanax), clonazepam (Klonopine), lorazepam (Ativan), clorazepate (Tranxene), chlordiazepoxide (Librium), estazolam (Prosom), oxazepam (Serax), temazepam (Restoril), triazolam (Halcion) °(Last updated: 11/19/2020) °____________________________________________________________________________________________ ° ____________________________________________________________________________________________ ° °Medication Recommendations and Reminders ° °Applies to: All patients receiving prescriptions (written and/or electronic). ° °Medication Rules & Regulations: These rules and regulations exist for your safety and that of others. They are not flexible and neither are we. Dismissing or ignoring them will be considered "non-compliance" with medication therapy, resulting in complete and irreversible termination of such therapy. (See document titled "Medication Rules" for more details.) In all conscience,  because of safety reasons, we cannot continue providing a therapy where the patient does not follow instructions. ° °Pharmacy of record:  °Definition: This is the pharmacy where your electronic prescriptions will be sent.  °We do not endorse any particular pharmacy, however, we have experienced problems with [facility] not securing enough medication supply for the community. °We do not restrict you   in your choice of pharmacy. However, once we write for your prescriptions, we will NOT be re-sending more prescriptions to fix restricted supply problems created by your pharmacy, or your insurance.  °The pharmacy listed in the electronic medical record should be the one where you want electronic prescriptions to be sent. °If you choose to change pharmacy, simply notify our nursing staff. ° °Recommendations: °Keep all of your pain medications in a safe place, under lock and key, even if you live alone. We will NOT replace lost, stolen, or damaged medication. °After you fill your prescription, take 1 week's worth of pills and put them away in a safe place. You should keep a separate, properly labeled bottle for this purpose. The remainder should be kept in the original bottle. Use this as your primary supply, until it runs out. Once it's gone, then you know that you have 1 week's worth of medicine, and it is time to come in for a prescription refill. If you do this correctly, it is unlikely that you will ever run out of medicine. °To make sure that the above recommendation works, it is very important that you make sure your medication refill appointments are scheduled at least 1 week before you run out of medicine. To do this in an effective manner, make sure that you do not leave the office without scheduling your next medication management appointment. Always ask the nursing staff to show you in your prescription , when your medication will be running out. Then arrange for the receptionist to get you a return appointment,  at least 7 days before you run out of medicine. Do not wait until you have 1 or 2 pills left, to come in. This is very poor planning and does not take into consideration that we may need to cancel appointments due to bad weather, sickness, or emergencies affecting our staff. °DO NOT ACCEPT A "Partial Fill": If for any reason your pharmacy does not have enough pills/tablets to completely fill or refill your prescription, do not allow for a "partial fill". The law allows the pharmacy to complete that prescription within 72 hours, without requiring a new prescription. If they do not fill the rest of your prescription within those 72 hours, you will need a separate prescription to fill the remaining amount, which we will NOT provide. If the reason for the partial fill is your insurance, you will need to talk to the pharmacist about payment alternatives for the remaining tablets, but again, DO NOT ACCEPT A PARTIAL FILL, unless you can trust your pharmacist to obtain the remainder of the pills within 72 hours. ° °Prescription refills and/or changes in medication(s):  °Prescription refills, and/or changes in dose or medication, will be conducted only during scheduled medication management appointments. (Applies to both, written and electronic prescriptions.) °No refills on procedure days. No medication will be changed or started on procedure days. No changes, adjustments, and/or refills will be conducted on a procedure day. Doing so will interfere with the diagnostic portion of the procedure. °No phone refills. No medications will be "called into the pharmacy". °No Fax refills. °No weekend refills. °No Holliday refills. °No after hours refills. ° °Remember:  °Business hours are:  °Monday to Thursday 8:00 AM to 4:00 PM °Provider's Schedule: °Mayme Profeta, MD - Appointments are:  °Medication management: Monday and Wednesday 8:00 AM to 4:00 PM °Procedure day: Tuesday and Thursday 7:30 AM to 4:00 PM °Bilal Lateef, MD -  Appointments are:  °Medication management: Tuesday and Thursday 8:00   AM to 4:00 PM °Procedure day: Monday and Wednesday 7:30 AM to 4:00 PM °(Last update: 09/12/2019) °____________________________________________________________________________________________ ° ____________________________________________________________________________________________ ° °CBD (cannabidiol) & Delta-8 (Delta-8 tetrahydrocannabinol) WARNING ° °Intro: Cannabidiol (CBD) and tetrahydrocannabinol (THC), are two natural compounds found in plants of the Cannabis genus. They can both be extracted from hemp or cannabis. Hemp and cannabis come from the Cannabis sativa plant. Both compounds interact with your body’s endocannabinoid system, but they have very different effects. CBD does not produce the high sensation associated with cannabis. Delta-8 tetrahydrocannabinol, also known as delta-8 THC, is a psychoactive substance found in the Cannabis sativa plant, of which marijuana and hemp are two varieties. THC is responsible for the high associated with the illicit use of marijuana. ° °Applicable to: All individuals currently taking or considering taking CBD (cannabidiol) and, more important, all patients taking opioid analgesic controlled substances (pain medication). (Example: oxycodone; oxymorphone; hydrocodone; hydromorphone; morphine; methadone; tramadol; tapentadol; fentanyl; buprenorphine; butorphanol; dextromethorphan; meperidine; codeine; etc.) ° °Legal status: CBD remains a Schedule I drug prohibited for any use. CBD is illegal with one exception. In the [place], CBD has a limited Food and Drug Administration (FDA) approval for the treatment of two specific types of epilepsy disorders. Only one CBD product has been approved by the FDA for this purpose: "Epidiolex". FDA is aware that some companies are marketing products containing cannabis and cannabis-derived compounds in ways that violate the Federal Food, Drug and Cosmetic Act  (FD&C Act) and that may put the health and safety of consumers at risk. The FDA, a Federal agency, has not enforced the CBD status since 2018. UPDATE: (04/10/2021) The Drug Enforcement Agency (DEA) issued a letter stating that "delta" cannabinoids, including Delta-8-THCO and Delta-9-THCO, synthetically derived from hemp do not qualify as hemp and will be viewed as Schedule I drugs. (Schedule I drugs, substances, or chemicals are defined as drugs with no currently accepted medical use and a high potential for abuse. Some examples of Schedule I drugs are: heroin, lysergic acid diethylamide (LSD), marijuana (cannabis), 3,4-methylenedioxymethamphetamine (ecstasy), methaqualone, and peyote.) (https://www.dea.gov) ° °Legality: Some manufacturers ship CBD products nationally, which is illegal. Often such products are sold online and are therefore available throughout the country. CBD is openly sold in head shops and health food stores in some states where such sales have not been explicitly legalized. Selling unapproved products with unsubstantiated therapeutic claims is not only a violation of the law, but also can put patients at risk, as these products have not been proven to be safe or effective. Federal illegality makes it difficult to conduct research on CBD. ° °Reference: "FDA Regulation of Cannabis and Cannabis-Derived Products, Including Cannabidiol (CBD)" - https://www.fda.gov/news-events/public-health-focus/fda-regulation-cannabis-and-cannabis-derived-products-including-cannabidiol-cbd ° °Warning: CBD is not FDA approved and has not undergo the same manufacturing controls as prescription drugs.  This means that the purity and safety of available CBD may be questionable. Most of the time, despite manufacturer's claims, it is contaminated with THC (delta-9-tetrahydrocannabinol - the chemical in marijuana responsible for the "HIGH").  When this is the case, the THC contaminant will trigger a positive urine drug  screen (UDS) test for Marijuana (carboxy-THC). Because a positive UDS for any illicit substance is a violation of our medication agreement, your opioid analgesics (pain medicine) may be permanently discontinued. °The FDA recently put out a warning about 5 things that everyone should be aware of regarding Delta-8 THC: °Delta-8 THC products have not been evaluated or approved by the FDA for safe use and may be marketed in ways that put the   public health at risk. °The FDA has received adverse event reports involving delta-8 THC-containing products. °Delta-8 THC has psychoactive and intoxicating effects. °Delta-8 THC manufacturing often involve use of potentially harmful chemicals to create the concentrations of delta-8 THC claimed in the marketplace. The final delta-8 THC product may have potentially harmful by-products (contaminants) due to the chemicals used in the process. Manufacturing of delta-8 THC products may occur in uncontrolled or unsanitary settings, which may lead to the presence of unsafe contaminants or other potentially harmful substances. °Delta-8 THC products should be kept out of the reach of children and pets. ° °MORE ABOUT CBD ° °General Information: CBD was discovered in 1940 and it is a derivative of the cannabis sativa genus plants (Marijuana and Hemp). It is one of the 113 identified substances found in Marijuana. It accounts for up to 40% of the plant's extract. As of 2018, preliminary clinical studies on CBD included research for the treatment of anxiety, movement disorders, and pain. CBD is available and consumed in multiple forms, including inhalation of smoke or vapor, as an aerosol spray, and by mouth. It may be supplied as an oil containing CBD, capsules, dried cannabis, or as a liquid solution. CBD is thought not to be as psychoactive as THC (delta-9-tetrahydrocannabinol - the chemical in marijuana responsible for the "HIGH"). Studies suggest that CBD may interact with different  biological target receptors in the body, including cannabinoid and other neurotransmitter receptors. As of 2018 the mechanism of action for its biological effects has not been determined. ° °Side-effects   Adverse reactions: Dry mouth, diarrhea, decreased appetite, fatigue, drowsiness, malaise, weakness, sleep disturbances, and others. ° °Drug interactions: CBC may interact with other medications such as blood-thinners. Because CBD causes drowsiness on its own, it also increases the drowsiness caused by other medications, including antihistamines (such as Benadryl), benzodiazepines (Xanax, Ativan, Valium), antipsychotics, antidepressants and opioids, as well as alcohol and supplements such as kava, melatonin and St. John's Wort. Be cautious with the following combinations:  ° °Brivaracetam (Briviact) °Brivaracetam is changed and broken down by the body. CBD might decrease how quickly the body breaks down brivaracetam. This might increase levels of brivaracetam in the body. ° °Caffeine °Caffeine is changed and broken down by the body. CBD might decrease how quickly the body breaks down caffeine. This might increase levels of caffeine in the body. ° °Carbamazepine (Tegretol) °Carbamazepine is changed and broken down by the body. CBD might decrease how quickly the body breaks down carbamazepine. This might increase levels of carbamazepine in the body and increase its side effects. ° °Citalopram (Celexa) °Citalopram is changed and broken down by the body. CBD might decrease how quickly the body breaks down citalopram. This might increase levels of citalopram in the body and increase its side effects. ° °Clobazam (Onfi) °Clobazam is changed and broken down by the liver. CBD might decrease how quickly the liver breaks down clobazam. This might increase the effects and side effects of clobazam. ° °Eslicarbazepine (Aptiom) °Eslicarbazepine is changed and broken down by the body. CBD might decrease how quickly the body  breaks down eslicarbazepine. This might increase levels of eslicarbazepine in the body by a small amount. ° °Everolimus (Zostress) °Everolimus is changed and broken down by the body. CBD might decrease how quickly the body breaks down everolimus. This might increase levels of everolimus in the body. ° °Lithium °Taking higher doses of CBD might increase levels of lithium. This can increase the risk of lithium toxicity. ° °Medications changed by the   liver (Cytochrome P450 1A1 (CYP1A1) substrates) °Some medications are changed and broken down by the liver. CBD might change how quickly the liver breaks down these medications. This could change the effects and side effects of these medications. ° °Medications changed by the liver (Cytochrome P450 1A2 (CYP1A2) substrates) °Some medications are changed and broken down by the liver. CBD might change how quickly the liver breaks down these medications. This could change the effects and side effects of these medications. ° °Medications changed by the liver (Cytochrome P450 1B1 (CYP1B1) substrates) °Some medications are changed and broken down by the liver. CBD might change how quickly the liver breaks down these medications. This could change the effects and side effects of these medications. ° °Medications changed by the liver (Cytochrome P450 2A6 (CYP2A6) substrates) °Some medications are changed and broken down by the liver. CBD might change how quickly the liver breaks down these medications. This could change the effects and side effects of these medications. ° °Medications changed by the liver (Cytochrome P450 2B6 (CYP2B6) substrates) °Some medications are changed and broken down by the liver. CBD might change how quickly the liver breaks down these medications. This could change the effects and side effects of these medications. ° °Medications changed by the liver (Cytochrome P450 2C19 (CYP2C19) substrates) °Some medications are changed and broken down by the liver.  CBD might change how quickly the liver breaks down these medications. This could change the effects and side effects of these medications. ° °Medications changed by the liver (Cytochrome P450 2C8 (CYP2C8) substrates) °Some medications are changed and broken down by the liver. CBD might change how quickly the liver breaks down these medications. This could change the effects and side effects of these medications. ° °Medications changed by the liver (Cytochrome P450 2C9 (CYP2C9) substrates) °Some medications are changed and broken down by the liver. CBD might change how quickly the liver breaks down these medications. This could change the effects and side effects of these medications. ° °Medications changed by the liver (Cytochrome P450 2D6 (CYP2D6) substrates) °Some medications are changed and broken down by the liver. CBD might change how quickly the liver breaks down these medications. This could change the effects and side effects of these medications. ° °Medications changed by the liver (Cytochrome P450 2E1 (CYP2E1) substrates) °Some medications are changed and broken down by the liver. CBD might change how quickly the liver breaks down these medications. This could change the effects and side effects of these medications. ° °Medications changed by the liver (Cytochrome P450 3A4 (CYP3A4) substrates) °Some medications are changed and broken down by the liver. CBD might change how quickly the liver breaks down these medications. This could change the effects and side effects of these medications. ° °Medications changed by the liver (Glucuronidated drugs) °Some medications are changed and broken down by the liver. CBD might change how quickly the liver breaks down these medications. This could change the effects and side effects of these medications. ° °Medications that decrease the breakdown of other medications by the liver (Cytochrome P450 2C19 (CYP2C19) inhibitors) °CBD is changed and broken down by the liver.  Some drugs decrease how quickly the liver changes and breaks down CBD. This could change the effects and side effects of CBD. ° °Medications that decrease the breakdown of other medications in the liver (Cytochrome P450 3A4 (CYP3A4) inhibitors) °CBD is changed and broken down by the liver. Some drugs decrease how quickly the liver changes and breaks down CBD. This could change the   effects and side effects of CBD. ° °Medications that increase breakdown of other medications by the liver (Cytochrome P450 3A4 (CYP3A4) inducers) °CBD is changed and broken down by the liver. Some drugs increase how quickly the liver changes and breaks down CBD. This could change the effects and side effects of CBD. ° °Medications that increase the breakdown of other medications by the liver (Cytochrome P450 2C19 (CYP2C19) inducers) °CBD is changed and broken down by the liver. Some drugs increase how quickly the liver changes and breaks down CBD. This could change the effects and side effects of CBD. ° °Methadone (Dolophine) °Methadone is broken down by the liver. CBD might decrease how quickly the liver breaks down methadone. Taking cannabidiol along with methadone might increase the effects and side effects of methadone. ° °Rufinamide (Banzel) °Rufinamide is changed and broken down by the body. CBD might decrease how quickly the body breaks down rufinamide. This might increase levels of rufinamide in the body by a small amount. ° °Sedative medications (CNS depressants) °CBD might cause sleepiness and slowed breathing. Some medications, called sedatives, can also cause sleepiness and slowed breathing. Taking CBD with sedative medications might cause breathing problems and/or too much sleepiness. ° °Sirolimus (Rapamune) °Sirolimus is changed and broken down by the body. CBD might decrease how quickly the body breaks down sirolimus. This might increase levels of sirolimus in the body. ° °Stiripentol (Diacomit) °Stiripentol is changed and  broken down by the body. CBD might decrease how quickly the body breaks down stiripentol. This might increase levels of stiripentol in the body and increase its side effects. ° °Tacrolimus (Prograf) °Tacrolimus is changed and broken down by the body. CBD might decrease how quickly the body breaks down tacrolimus. This might increase levels of tacrolimus in the body. ° °Tamoxifen (Soltamox) °Tamoxifen is changed and broken down by the body. CBD might affect how quickly the body breaks down tamoxifen. This might affect levels of tamoxifen in the body. ° °Topiramate (Topamax) °Topiramate is changed and broken down by the body. CBD might decrease how quickly the body breaks down topiramate. This might increase levels of topiramate in the body by a small amount. ° °Valproate °Valproic acid can cause liver injury. Taking cannabidiol with valproic acid might increase the chance of liver injury. CBD and/or valproic acid might need to be stopped, or the dose might need to be reduced. ° °Warfarin (Coumadin) °CBD might increase levels of warfarin, which can increase the risk for bleeding. CBD and/or warfarin might need to be stopped, or the dose might need to be reduced. ° °Zonisamide °Zonisamide is changed and broken down by the body. CBD might decrease how quickly the body breaks down zonisamide. This might increase levels of zonisamide in the body by a small amount. °(Last update: 04/22/2021) °____________________________________________________________________________________________ ° ____________________________________________________________________________________________ ° °Drug Holidays (Slow) ° °What is a "Drug Holiday"? °Drug Holiday: is the name given to the period of time during which a patient stops taking a medication(s) for the purpose of eliminating tolerance to the drug. ° °Benefits °Improved effectiveness of opioids. °Decreased opioid dose needed to achieve benefits. °Improved pain with lesser  dose. ° °What is tolerance? °Tolerance: is the progressive decreased in effectiveness of a drug due to its repetitive use. With repetitive use, the body gets use to the medication and as a consequence, it loses its effectiveness. This is a common problem seen with opioid pain medications. As a result, a larger dose of the drug is needed to achieve the same effect   that used to be obtained with a smaller dose. ° °How long should a "Drug Holiday" last? °You should stay off of the pain medicine for at least 14 consecutive days. (2 weeks) ° °Should I stop the medicine "cold turkey"? °No. You should always coordinate with your Pain Specialist so that he/she can provide you with the correct medication dose to make the transition as smoothly as possible. ° °How do I stop the medicine? °Slowly. You will be instructed to decrease the daily amount of pills that you take by one (1) pill every seven (7) days. This is called a "slow downward taper" of your dose. For example: if you normally take four (4) pills per day, you will be asked to drop this dose to three (3) pills per day for seven (7) days, then to two (2) pills per day for seven (7) days, then to one (1) per day for seven (7) days, and at the end of those last seven (7) days, this is when the "Drug Holiday" would start.  ° °Will I have withdrawals? °By doing a "slow downward taper" like this one, it is unlikely that you will experience any significant withdrawal symptoms. Typically, what triggers withdrawals is the sudden stop of a high dose opioid therapy. Withdrawals can usually be avoided by slowly decreasing the dose over a prolonged period of time. If you do not follow these instructions and decide to stop your medication abruptly, withdrawals may be possible. ° °What are withdrawals? °Withdrawals: refers to the wide range of symptoms that occur after stopping or dramatically reducing opiate drugs after heavy and prolonged use. Withdrawal symptoms do not occur to  patients that use low dose opioids, or those who take the medication sporadically. Contrary to benzodiazepine (example: Valium, Xanax, etc.) or alcohol withdrawals (“Delirium Tremens”), opioid withdrawals are not lethal. Withdrawals are the physical manifestation of the body getting rid of the excess receptors. ° °Expected Symptoms °Early symptoms of withdrawal may include: °Agitation °Anxiety °Muscle aches °Increased tearing °Insomnia °Runny nose °Sweating °Yawning ° °Late symptoms of withdrawal may include: °Abdominal cramping °Diarrhea °Dilated pupils °Goose bumps °Nausea °Vomiting ° °Will I experience withdrawals? °Due to the slow nature of the taper, it is very unlikely that you will experience any. ° °What is a slow taper? °Taper: refers to the gradual decrease in dose.  °(Last update: 09/12/2019) °____________________________________________________________________________________________ ° °  °

## 2021-05-13 ENCOUNTER — Encounter: Payer: Managed Care, Other (non HMO) | Admitting: Pain Medicine

## 2021-06-08 ENCOUNTER — Other Ambulatory Visit: Payer: Self-pay | Admitting: Primary Care

## 2021-06-08 DIAGNOSIS — M7918 Myalgia, other site: Secondary | ICD-10-CM

## 2021-06-08 DIAGNOSIS — M62838 Other muscle spasm: Secondary | ICD-10-CM

## 2021-06-08 MED ORDER — METAXALONE 800 MG PO TABS: 800.0000 mg | ORAL_TABLET | Freq: Three times a day (TID) | ORAL | 0 refills | Status: DC | PRN

## 2021-07-01 ENCOUNTER — Encounter: Payer: Self-pay | Admitting: Obstetrics and Gynecology

## 2021-07-21 ENCOUNTER — Other Ambulatory Visit: Payer: Self-pay | Admitting: Primary Care

## 2021-07-21 DIAGNOSIS — M62838 Other muscle spasm: Secondary | ICD-10-CM

## 2021-07-21 DIAGNOSIS — M7918 Myalgia, other site: Secondary | ICD-10-CM

## 2021-08-02 NOTE — Progress Notes (Unsigned)
PROVIDER NOTE: Information contained herein reflects review and annotations entered in association with encounter. Interpretation of such information and data should be left to medically-trained personnel. Information provided to patient can be located elsewhere in the medical record under "Patient Instructions". Document created using STT-dictation technology, any transcriptional errors that may result from process are unintentional.    Patient: Debra Myers  Service Category: E/M  Provider: Gaspar Cola, MD  DOB: June 02, 1965  DOS: 08/05/2021  Specialty: Interventional Pain Management  MRN: [id number]  Setting: Ambulatory outpatient  PCP: Pleas Koch, NP  Type: Established Patient    Referring Provider: Pleas Koch, NP  Location: Office  Delivery: Face-to-face     HPI  Debra Myers, a 56 y.o. year old female, is here today because of her No primary diagnosis found.. Debra Myers's primary complain today is No chief complaint on file. Last encounter: My last encounter with her was on 04/27/2021. Pertinent problems: Debra Myers has Numbness of upper extremity; Radiculitis involving upper extremity; Chronic low back pain (1ry area of Pain) (Bilateral) (L>R); Lumbar spondylosis (Bulging Disc & Severe Left Foraminal Stenosis at L3-4); Chronic lower extremity pain (2ry area of Pain) (Left); Lumbar foraminal stenosis (Severe Left L3-4); Lumbar facet syndrome (Bilateral) (L>R); Cervical spondylosis (C5-6 & C6-7 DDD); Cervical (3 mm) Grade 1 Anterolisthesis of C4 over C5; Chronic neck pain (3ry area of Pain) (Bilateral) (L>R); Chronic cervical radicular pain (Bilateral) (L>R); Cervical foraminal stenosis (multilevel) (Bilateral); Muscle spasm, nocturnal; Musculoskeletal pain; Carpal tunnel syndrome (Bilateral) (L>R); Cervical facet syndrome (Bilateral) (L>R); Chronic pain syndrome; DDD (degenerative disc disease), lumbar; DDD (degenerative disc disease), cervical; Pain in right knee; Chronic pain  of left knee; Osteoarthritis involving multiple joints; and Acute exacerbation of chronic low back pain on their pertinent problem list. Pain Assessment: Severity of   is reported as a  /10. Location:    / . Onset:  . Quality:  . Timing:  . Modifying factor(s):  Marland Kitchen Vitals:  vitals were not taken for this visit.   Reason for encounter:  *** . ***  Pharmacotherapy Assessment  Analgesic: Hydrocodone/APAP 5/325, 2 tab PO QD (10 mg/day of hydrocodone) MME/day: 10 mg/day.   Monitoring: [place] PMP: PDMP reviewed during this encounter.       Pharmacotherapy: No side-effects or adverse reactions reported. Compliance: No problems identified. Effectiveness: Clinically acceptable.  No notes on file  UDS:  Summary  Date Value Ref Range Status  07/28/2020 Note  Final    Comment:    ==================================================================== [facility] (MW) ==================================================================== Test                             Result       Flag       Units  Drug Present and Declared for Prescription Verification   Amphetamine                    343          EXPECTED   Myers/mg creat    Amphetamine is available as a schedule II prescription drug.    Lorazepam                      476          EXPECTED   Myers/mg creat    Source of lorazepam is a scheduled prescription medication.    Hydrocodone  321          EXPECTED   Myers/mg creat   Dihydrocodeine                 66           EXPECTED   Myers/mg creat   Norhydrocodone                 802          EXPECTED   Myers/mg creat    Sources of hydrocodone include scheduled prescription medications.    Dihydrocodeine and norhydrocodone are expected metabolites of    hydrocodone. Dihydrocodeine is also available as a scheduled    prescription medication.  ==================================================================== Test                      Result    Flag   Units      Ref Range   Creatinine               92               mg/dL      >=20 ==================================================================== Declared Medications:  The flagging and interpretation on this report are based on the  following declared medications.  Unexpected results may arise from  inaccuracies in the declared medications.   **Note: The testing scope of this panel includes these medications:   Amphetamine (Vyvanse)  Hydrocodone (Norco)  Lorazepam (Ativan)   **Note: The testing scope of this panel does not include the  following reported medications:   Acetaminophen (Norco)  Albuterol (Ventolin HFA)  Aspirin  Atorvastatin (Lipitor)  Bupropion (Wellbutrin)  Cetirizine (Zyrtec)  Fluticasone (Flonase)  Ibuprofen (Advil)  Lamotrigine (Lamictal)  Metaxalone (Skelaxin)  Metformin  Montelukast (Singulair)  Paroxetine (Paxil)  Spironolactone (Aldactone) ==================================================================== For clinical consultation, please call [phone]. ====================================================================      ROS  Constitutional: Denies any fever or chills Gastrointestinal: No reported hemesis, hematochezia, vomiting, or acute GI distress Musculoskeletal: Denies any acute onset joint swelling, redness, loss of ROM, or weakness Neurological: No reported episodes of acute onset apraxia, aphasia, dysarthria, agnosia, amnesia, paralysis, loss of coordination, or loss of consciousness  Medication Review  HYDROcodone-acetaminophen, LORazepam, PARoxetine, albuterol, aspirin EC, atorvastatin, buPROPion, cetirizine, fluticasone, ibuprofen, lamoTRIgine, lisdexamfetamine, metFORMIN, metaxalone, montelukast, and spironolactone  History Review  Allergy: Debra Myers is allergic to oxycodone, penicillins, red dye, and sulfa antibiotics. Drug: Debra Myers  reports no history of drug use. Alcohol:  reports no history of alcohol use. Tobacco:  reports that she has never  smoked. She has never used smokeless tobacco. Social: Debra Myers  reports that she has never smoked. She has never used smokeless tobacco. She reports that she does not drink alcohol and does not use drugs. Medical:  has a past medical history of Anxiety, Asthma, Bulging lumbar disc (L3-4) (02/26/2015), Degenerative disc disease, Degenerative lumbar disc, Depression, Diabetes mellitus, Elective abortion, GERD (gastroesophageal reflux disease), Hypertension, Stroke ([place]) (06/11/2015), and Vaginal delivery. Surgical: Debra Myers  has a past surgical history that includes Cholecystectomy (2001); Lapband (04/2012); Wisdom tooth extraction; Dilatation & curettage/hysteroscopy with myosure (N/A, 09/10/2014); mirena; and lap band surgery. Family: family history includes Asthma in her maternal grandmother; Diabetes in her father; Hypertension in her father and mother; Stroke in her maternal grandfather and maternal grandmother.  Laboratory Chemistry Profile   Renal Lab Results  Component Value Date   BUN 13 01/21/2021   CREATININE 0.92 67/20/9470   BCR NOT APPLICABLE 96/28/3662  GFR 70.59 01/21/2021   GFRAA >60 12/16/2017   GFRNONAA >60 12/16/2017    Hepatic Lab Results  Component Value Date   AST 16 01/21/2021   ALT 18 01/21/2021   ALBUMIN 4.4 01/21/2021   ALKPHOS 104 01/21/2021   HCVAB NEGATIVE 05/18/2013    Electrolytes Lab Results  Component Value Date   NA 141 01/21/2021   K 4.3 01/21/2021   CL 104 01/21/2021   CALCIUM 9.2 01/21/2021   PHOS 4.0 10/31/2015    Bone Lab Results  Component Value Date   VD25OH 25.55 (L) 01/21/2021    Inflammation (CRP: Acute Phase) (ESR: Chronic Phase) Lab Results  Component Value Date   ESRSEDRATE 32 05/26/2017         Note: Above Lab results reviewed.  Recent Imaging Review  VAS [name] LOWER EXTREMITY VENOUS (DVT) ([facility] and [facility] 7a-7p)  Lower Venous Study  Indications: Pain.   Performing Technologist: Maudry Mayhew MHA, RDMS, RVT, RDCS     Examination Guidelines: A complete evaluation includes B-mode imaging, spectral Doppler, color Doppler, and power Doppler as needed of all accessible portions of each vessel. Bilateral testing is considered an integral part of a complete examination. Limited examinations for reoccurring indications may be performed as noted.    Right Venous Findings: +---+---------------+---------+-----------+----------+-------+    CompressibilityPhasicitySpontaneityPropertiesSummary +---+---------------+---------+-----------+----------+-------+ CFVFull           Yes      Yes                          +---+---------------+---------+-----------+----------+-------+     Left Venous Findings: +---------+---------------+---------+-----------+----------+-------+          CompressibilityPhasicitySpontaneityPropertiesSummary +---------+---------------+---------+-----------+----------+-------+ CFV      Full           Yes      Yes                          +---------+---------------+---------+-----------+----------+-------+ SFJ      Full                                                 +---------+---------------+---------+-----------+----------+-------+ FV Prox  Full                                                 +---------+---------------+---------+-----------+----------+-------+ FV Mid   Full                                                 +---------+---------------+---------+-----------+----------+-------+ FV DistalFull                                                 +---------+---------------+---------+-----------+----------+-------+ PFV      Full                                                 +---------+---------------+---------+-----------+----------+-------+  POP      Full           Yes      Yes                          +---------+---------------+---------+-----------+----------+-------+ PTV      Full                                                  +---------+---------------+---------+-----------+----------+-------+ PERO     Full                                                 +---------+---------------+---------+-----------+----------+-------+          Summary: Right: No evidence of common femoral vein obstruction. Left: There is no evidence of deep vein thrombosis in the lower extremity. No cystic structure found in the popliteal fossa.   *See table(s) above for measurements and observations.  Electronically signed by Monica Martinez MD on 12/16/2017 at 6:04:46 PM.      Final   Note: Reviewed        Physical Exam  General appearance: Well nourished, well developed, and well hydrated. In no apparent acute distress Mental status: Alert, oriented x 3 (person, place, & time)       Respiratory: No evidence of acute respiratory distress Eyes: PERLA Vitals: There were no vitals taken for this visit. BMI: Estimated body mass index is 41.2 kg/m as calculated from the following:   Height as of 04/27/21: '5\' 4"'  (1.626 m).   Weight as of 04/27/21: 240 lb (108.9 kg). Ideal: Patient weight not recorded  Assessment   Diagnosis Status  No diagnosis found. Controlled Controlled Controlled   Updated Problems: No problems updated.  Plan of Care  Problem-specific:  No problem-specific Assessment & Plan notes found for this encounter.  Debra Myers has a current medication list which includes the following long-term medication(s): albuterol, atorvastatin, fluticasone, hydrocodone-acetaminophen, hydrocodone-acetaminophen, hydrocodone-acetaminophen, metaxalone, metformin, montelukast, and spironolactone.  Pharmacotherapy (Medications Ordered): No orders of the defined types were placed in this encounter.  Orders:  No orders of the defined types were placed in this encounter.  Follow-up plan:   No follow-ups on file.     Interventional management options: Planned, scheduled, and/or pending:       Considering:   Diagnostic bilateral lumbar facet block  Possible bilateral lumbar facet RFA.  Diagnostic left L3-4 LESI  Diagnostic left L3 TFESI  Diagnostic left L4 TFESI  Diagnostic bilateral L5 TFESI  Diagnostic left CESI  Diagnostic bilateral cervical facet block  Possible bilateral cervical facet RFA.    Palliative PRN treatment(s):   None at this time      Recent Visits No visits were found meeting these conditions. Showing recent visits within past 90 days and meeting all other requirements Future Appointments Date Type Provider Dept  08/05/21 Appointment Milinda Pointer, MD [facility]  Showing future appointments within next 90 days and meeting all other requirements  I discussed the assessment and treatment plan with the patient. The patient was provided an opportunity to ask questions and all were answered. The patient agreed with the plan and demonstrated an understanding of the instructions.  Patient advised to call back or seek an in-person evaluation if the symptoms or condition worsens.  Duration of encounter: *** minutes.  Note by: Gaspar Cola, MD Date: 08/05/2021; Time: 8:39 AM

## 2021-08-05 ENCOUNTER — Ambulatory Visit: Payer: Managed Care, Other (non HMO) | Attending: Pain Medicine | Admitting: Pain Medicine

## 2021-08-05 ENCOUNTER — Encounter: Payer: Self-pay | Admitting: Pain Medicine

## 2021-08-05 VITALS — BP 136/75 | HR 89 | Temp 97.2°F | Resp 18 | Ht 64.0 in | Wt 235.0 lb

## 2021-08-05 DIAGNOSIS — Z79899 Other long term (current) drug therapy: Secondary | ICD-10-CM

## 2021-08-05 DIAGNOSIS — G8929 Other chronic pain: Secondary | ICD-10-CM | POA: Diagnosis present

## 2021-08-05 DIAGNOSIS — M7918 Myalgia, other site: Secondary | ICD-10-CM | POA: Diagnosis present

## 2021-08-05 DIAGNOSIS — M47812 Spondylosis without myelopathy or radiculopathy, cervical region: Secondary | ICD-10-CM

## 2021-08-05 DIAGNOSIS — G894 Chronic pain syndrome: Secondary | ICD-10-CM

## 2021-08-05 DIAGNOSIS — M542 Cervicalgia: Secondary | ICD-10-CM | POA: Diagnosis not present

## 2021-08-05 DIAGNOSIS — M79605 Pain in left leg: Secondary | ICD-10-CM | POA: Diagnosis not present

## 2021-08-05 DIAGNOSIS — M5412 Radiculopathy, cervical region: Secondary | ICD-10-CM | POA: Diagnosis present

## 2021-08-05 DIAGNOSIS — M545 Low back pain, unspecified: Secondary | ICD-10-CM

## 2021-08-05 DIAGNOSIS — M47816 Spondylosis without myelopathy or radiculopathy, lumbar region: Secondary | ICD-10-CM | POA: Diagnosis present

## 2021-08-05 DIAGNOSIS — Z79891 Long term (current) use of opiate analgesic: Secondary | ICD-10-CM | POA: Diagnosis present

## 2021-08-05 MED ORDER — HYDROCODONE-ACETAMINOPHEN 5-325 MG PO TABS: 0.5000 | ORAL_TABLET | Freq: Two times a day (BID) | ORAL | 0 refills | Status: DC | PRN

## 2021-08-05 NOTE — Progress Notes (Signed)
Nursing Pain Medication Assessment:  Safety precautions to be maintained throughout the outpatient stay will include: orient to surroundings, keep bed in low position, maintain call bell within reach at all times, provide assistance with transfer out of bed and ambulation.  Medication Inspection Compliance: Pill count conducted under aseptic conditions, in front of the patient. Neither the pills nor the bottle was removed from the patient's sight at any time. Once count was completed pills were immediately returned to the patient in their original bottle.  Medication: Hydrocodone/APAP Pill/Patch Count:  43.5 of 60 pills remain Pill/Patch Appearance: Markings consistent with prescribed medication Bottle Appearance: Standard pharmacy container. Clearly labeled. Filled Date: 06 / 06 / 2023 Last Medication intake:  Today

## 2021-08-05 NOTE — Patient Instructions (Signed)

## 2021-08-11 LAB — TOXASSURE SELECT 13 (MW), URINE

## 2021-08-31 NOTE — Progress Notes (Deleted)
56 y.o. G7P1011 Married Caucasian female here for annual exam.    PCP:  Vernona Rieger, NP   No LMP recorded. (Menstrual status: IUD).           Sexually active: {yes no:314532}  The current method of family planning is post menopausal status/Mirena IUD 09/2014.    Exercising: {yes no:314532}  {types:19826} Smoker:  no  Health Maintenance: Pap:   03-01-18  Neg:Neg HR HPV, 05-18-13 Neg:Neg HR HPV, 12-25-10 Neg History of abnormal Pap:  no MMG:  06-29-21 Neg/Birads1 Colonoscopy:  ***NEVER BMD:   n/a  Result  n/a TDaP:  PCP Gardasil:   no HIV: 2021 Neg Hep C: 2021 Neg Screening Labs:  Hb today: ***, Urine today: ***   reports that she has never smoked. She has never used smokeless tobacco. She reports that she does not drink alcohol and does not use drugs.  Past Medical History:  Diagnosis Date   Anxiety    Asthma    ALLERGY INDUCED   Bulging lumbar disc (L3-4) 02/26/2015   Degenerative disc disease    Degenerative lumbar disc    Depression    Diabetes mellitus    TYPE 2   Elective abortion    ONE   GERD (gastroesophageal reflux disease)    takes prilosec    Hypertension    Stroke (HCC) 06/11/2015   patient describes as mini stroke   Vaginal delivery    ONE NSVD    Past Surgical History:  Procedure Laterality Date   CHOLECYSTECTOMY  2001   DILATATION & CURETTAGE/HYSTEROSCOPY WITH MYOSURE N/A 09/10/2014   Procedure: DILATATION & CURETTAGE/HYSTEROSCOPY WITH MYOSURE/INSERTION OF IUD;  Surgeon: Ok Edwards, MD;  Location: [facility];  Service: Gynecology;  Laterality: N/A;   lap band surgery     Lapband  04/2012   mirena     inserted 09-2014   WISDOM TOOTH EXTRACTION      Current Outpatient Medications  Medication Sig Dispense Refill   albuterol (VENTOLIN HFA) 108 (90 Base) MCG/ACT inhaler Inhale 2 puffs into the lungs every 6 (six) hours as needed for wheezing or shortness of breath. 18 g 0   atorvastatin (LIPITOR) 10 MG tablet TAKE 1 TABLET(10 MG) BY MOUTH DAILY for  cholesterol. 90 tablet 3   buPROPion (WELLBUTRIN XL) 300 MG 24 hr tablet Take 300 mg by mouth daily.     cetirizine (ZYRTEC) 10 MG tablet Take 10 mg by mouth at bedtime.     fluticasone (FLONASE) 50 MCG/ACT nasal spray SHAKE LIQUID AND USE 1 SPRAY IN EACH NOSTRIL TWICE DAILY AS NEEDED FOR ALLERGIES OR RHINITIS 16 g 3   HYDROcodone-acetaminophen (NORCO/VICODIN) 5-325 MG tablet Take 0.5-1 tablets by mouth 2 (two) times daily as needed for severe pain. Must last 30 days 60 tablet 0   [START ON 09/15/2021] HYDROcodone-acetaminophen (NORCO/VICODIN) 5-325 MG tablet Take 0.5-1 tablets by mouth 2 (two) times daily as needed for severe pain. Must last 30 days 60 tablet 0   [START ON 10/15/2021] HYDROcodone-acetaminophen (NORCO/VICODIN) 5-325 MG tablet Take 0.5-1 tablets by mouth 2 (two) times daily as needed for severe pain. Must last 30 days 60 tablet 0   ibuprofen (ADVIL) 200 MG tablet Take 400 mg by mouth every 8 (eight) hours as needed.     lamoTRIgine (LAMICTAL) 200 MG tablet Take 300 mg by mouth at bedtime.      LORazepam (ATIVAN) 1 MG tablet Take 0.5-1 mg by mouth every 8 (eight) hours as needed for anxiety or sleep.  metaxalone (SKELAXIN) 800 MG tablet TAKE 1 TABLET(800 MG) BY MOUTH THREE TIMES DAILY AS NEEDED FOR MUSCLE SPASMS 270 tablet 0   metFORMIN (GLUCOPHAGE XR) 500 MG 24 hr tablet Take 1 tablet (500 mg total) by mouth daily with breakfast. 90 tablet 3   montelukast (SINGULAIR) 10 MG tablet Take 1 tablet (10 mg total) by mouth at bedtime. For allergies. 90 tablet 3   PARoxetine (PAXIL-CR) 25 MG 24 hr tablet Take 50 mg by mouth at bedtime.      RA ASPIRIN EC ADULT LOW ST 81 MG EC tablet take 1 tablet by mouth once daily 30 tablet 0   spironolactone (ALDACTONE) 25 MG tablet TAKE 1 TABLET(25 MG) BY MOUTH DAILY 90 tablet 3   VYVANSE 40 MG capsule Take 40 mg by mouth daily.  0   No current facility-administered medications for this visit.    Family History  Problem Relation Age of Onset    Hypertension Father    Diabetes Father    Hypertension Mother    Asthma Maternal Grandmother    Stroke Maternal Grandmother    Stroke Maternal Grandfather     Review of Systems  Exam:   There were no vitals taken for this visit.    General appearance: alert, cooperative and appears stated age Head: normocephalic, without obvious abnormality, atraumatic Neck: no adenopathy, supple, symmetrical, trachea midline and thyroid normal to inspection and palpation Lungs: clear to auscultation bilaterally Breasts: normal appearance, no masses or tenderness, No nipple retraction or dimpling, No nipple discharge or bleeding, No axillary adenopathy Heart: regular rate and rhythm Abdomen: soft, non-tender; no masses, no organomegaly Extremities: extremities normal, atraumatic, no cyanosis or edema Skin: skin color, texture, turgor normal. No rashes or lesions Lymph nodes: cervical, supraclavicular, and axillary nodes normal. Neurologic: grossly normal  Pelvic: External genitalia:  no lesions              No abnormal inguinal nodes palpated.              Urethra:  normal appearing urethra with no masses, tenderness or lesions              Bartholins and Skenes: normal                 Vagina: normal appearing vagina with normal color and discharge, no lesions              Cervix: no lesions              Pap taken: {yes no:314532} Bimanual Exam:  Uterus:  normal size, contour, position, consistency, mobility, non-tender              Adnexa: no mass, fullness, tenderness              Rectal exam: {yes no:314532}.  Confirms.              Anus:  normal sphincter tone, no lesions  Chaperone was present for exam:  ***  Assessment:   Well woman visit with gynecologic exam.   Plan: Mammogram screening discussed. Self breast awareness reviewed. Pap and HR HPV as above. Guidelines for Calcium, Vitamin D, regular exercise program including cardiovascular and weight bearing exercise.   Follow up  annually and prn.   Additional counseling given.  {yes [id number]. _______ minutes face to face time of which over 50% was spent in counseling.    After visit summary provided.

## 2021-09-02 ENCOUNTER — Ambulatory Visit: Payer: Managed Care, Other (non HMO) | Admitting: Obstetrics and Gynecology

## 2021-10-28 NOTE — Progress Notes (Signed)
56 y.o. G9P1011 Married Caucasian female here for annual exam.    Followed for a small calcified area of right ovary, 13 x 12 mm, by [name] 2017.  She had a left ovarian thin walled cyst 32 x 34 x 29 mm.  Negative doppler.  No free fluid. CA125 14.  Followed clinically.  FSH 66.1 and estradiol < 15 on 08/21/20. Sometimes has night sweats.   Mother had thyroid cancer.   Patient has concerns about her own thyroid health.  Saw Dr. Rise Mu in the past.   PCP: Vernona Rieger, NP    No LMP recorded. (Menstrual status: IUD).           Sexually active: Yes.    The current method of family planning is post menopausal status. Has Mirena IUD 09/2014 Exercising: Yes.     Gym 2x/week Smoker:  no  Health Maintenance: Pap:   03-01-18  Neg:Neg HR HPV, 05-18-13 Neg:Neg HR HPV, 12-25-10 Neg History of abnormal Pap:  no MMG:  06-29-21 Neg/Birads1 Colonoscopy:  NEVER BMD:   n/a  Result  n/a TDaP:  PCP Gardasil:   no HIV: 2021 Neg Hep C: 2021 Neg Screening Labs:  PCP   reports that she has never smoked. She has never used smokeless tobacco. She reports that she does not drink alcohol and does not use drugs.  Past Medical History:  Diagnosis Date   Anxiety    Asthma    ALLERGY INDUCED   Bulging lumbar disc (L3-4) 02/26/2015   Degenerative disc disease    Degenerative lumbar disc    Depression    Diabetes mellitus    TYPE 2   Elective abortion    ONE   GERD (gastroesophageal reflux disease)    takes prilosec    Hypertension    Stroke (HCC) 06/11/2015   patient describes as mini stroke   Vaginal delivery    ONE NSVD    Past Surgical History:  Procedure Laterality Date   CHOLECYSTECTOMY  2001   DILATATION & CURETTAGE/HYSTEROSCOPY WITH MYOSURE N/A 09/10/2014   Procedure: DILATATION & CURETTAGE/HYSTEROSCOPY WITH MYOSURE/INSERTION OF IUD;  Surgeon: Ok Edwards, MD;  Location: [facility];  Service: Gynecology;  Laterality: N/A;   lap band surgery     Lapband  04/2012   mirena     inserted  09-2014   WISDOM TOOTH EXTRACTION      Current Outpatient Medications  Medication Sig Dispense Refill   albuterol (VENTOLIN HFA) 108 (90 Base) MCG/ACT inhaler Inhale 2 puffs into the lungs every 6 (six) hours as needed for wheezing or shortness of breath. 18 g 0   atorvastatin (LIPITOR) 10 MG tablet TAKE 1 TABLET(10 MG) BY MOUTH DAILY for cholesterol. 90 tablet 3   buPROPion (WELLBUTRIN XL) 300 MG 24 hr tablet Take 300 mg by mouth daily.     cetirizine (ZYRTEC) 10 MG tablet Take 10 mg by mouth at bedtime.     fluticasone (FLONASE) 50 MCG/ACT nasal spray SHAKE LIQUID AND USE 1 SPRAY IN EACH NOSTRIL TWICE DAILY AS NEEDED FOR ALLERGIES OR RHINITIS 16 g 3   HYDROcodone-acetaminophen (NORCO/VICODIN) 5-325 MG tablet Take 0.5-1 tablets by mouth 2 (two) times daily as needed for severe pain. Must last 30 days 60 tablet 0   ibuprofen (ADVIL) 200 MG tablet Take 400 mg by mouth every 8 (eight) hours as needed.     ibuprofen (ADVIL) 800 MG tablet TK 1 T PO  Q 8 H PRN     lamoTRIgine (LAMICTAL) 200  MG tablet Take 300 mg by mouth at bedtime.      LORazepam (ATIVAN) 1 MG tablet Take 0.5-1 mg by mouth every 8 (eight) hours as needed for anxiety or sleep.      metaxalone (SKELAXIN) 800 MG tablet TAKE 1 TABLET(800 MG) BY MOUTH THREE TIMES DAILY AS NEEDED FOR MUSCLE SPASMS 270 tablet 0   metFORMIN (GLUCOPHAGE XR) 500 MG 24 hr tablet Take 1 tablet (500 mg total) by mouth daily with breakfast. 90 tablet 3   montelukast (SINGULAIR) 10 MG tablet Take 1 tablet (10 mg total) by mouth at bedtime. For allergies. 90 tablet 3   PARoxetine (PAXIL-CR) 37.5 MG 24 hr tablet Take 2 tablets by mouth daily.     RA ASPIRIN EC ADULT LOW ST 81 MG EC tablet take 1 tablet by mouth once daily 30 tablet 0   spironolactone (ALDACTONE) 25 MG tablet TAKE 1 TABLET(25 MG) BY MOUTH DAILY 90 tablet 3   VYVANSE 40 MG capsule Take 40 mg by mouth daily.  0   HYDROcodone-acetaminophen (NORCO/VICODIN) 5-325 MG tablet Take 0.5-1 tablets by mouth 2  (two) times daily as needed for severe pain. Must last 30 days 60 tablet 0   HYDROcodone-acetaminophen (NORCO/VICODIN) 5-325 MG tablet Take 0.5-1 tablets by mouth 2 (two) times daily as needed for severe pain. Must last 30 days 60 tablet 0   No current facility-administered medications for this visit.    Family History  Problem Relation Age of Onset   Cancer Mother        thyroid cancer   Hypertension Mother    Hypertension Father    Diabetes Father    Asthma Maternal Grandmother    Stroke Maternal Grandmother    Stroke Maternal Grandfather     Review of Systems  All other systems reviewed and are negative.   Exam:   BP 136/74   Pulse 89   Ht 5\' 3"  (1.6 m)   Wt 232 lb (105.2 kg)   SpO2 97%   BMI 41.10 kg/m     General appearance: alert, cooperative and appears stated age Head: normocephalic, without obvious abnormality, atraumatic Neck: no adenopathy, supple, symmetrical, trachea midline and thyroid normal to inspection and palpation Lungs: clear to auscultation bilaterally Breasts: normal appearance, no masses or tenderness, No nipple retraction or dimpling, No nipple discharge or bleeding, No axillary adenopathy Heart: regular rate and rhythm Abdomen: soft, non-tender; no masses, no organomegaly Extremities: extremities normal, atraumatic, no cyanosis or edema Skin: skin color, texture, turgor normal. No rashes or lesions Lymph nodes: cervical, supraclavicular, and axillary nodes normal. Neurologic: grossly normal  Pelvic: External genitalia:  no lesions              No abnormal inguinal nodes palpated.              Urethra:  normal appearing urethra with no masses, tenderness or lesions              Bartholins and Skenes: normal                 Vagina: normal appearing vagina with normal color and discharge, no lesions              Cervix: no lesions.  Cervix is looking stenotic.  Her IUD strings are seen.               Pap taken: no Bimanual Exam:  Uterus:  normal  size, contour, position, consistency, mobility, non-tender  Adnexa: no mass, fullness, tenderness              Rectal exam: yes.  Confirms.              Anus:  normal sphincter tone, no lesions  Chaperone was present for exam:  Marchelle Folks, CMA  Assessment:   Well woman visit with gynecologic exam. Mirena IUD.  Menopausal symptoms. Right ovarian calcification.   Normal CA125.  Hx simple left ovarian cyst.  DM.  Hx stroke.  Hx hemorrhoids.  Colon cancer screening.   Plan: Mammogram screening discussed. Self breast awareness reviewed. Pap and HR HPV 2025. Guidelines for Calcium, Vitamin D, regular exercise program including cardiovascular and weight bearing exercise. Will check FSH and estradiol.  If menopausal, plan for removal of the IUD. Will plan for Cytotec and paracervical block for her IUD removal. Referral for colonoscopy.  I recommend she address her thyroid concerns with her endocrinologist or her PCP after she obtains records about her thyroid care.  Follow up annually and prn.   After visit summary provided.

## 2021-10-29 ENCOUNTER — Encounter: Payer: Self-pay | Admitting: Obstetrics and Gynecology

## 2021-10-29 ENCOUNTER — Ambulatory Visit (INDEPENDENT_AMBULATORY_CARE_PROVIDER_SITE_OTHER): Payer: Managed Care, Other (non HMO) | Admitting: Obstetrics and Gynecology

## 2021-10-29 VITALS — BP 136/74 | HR 89 | Ht 63.0 in | Wt 232.0 lb

## 2021-10-29 DIAGNOSIS — Z01419 Encounter for gynecological examination (general) (routine) without abnormal findings: Secondary | ICD-10-CM | POA: Diagnosis not present

## 2021-10-29 DIAGNOSIS — N951 Menopausal and female climacteric states: Secondary | ICD-10-CM

## 2021-10-29 DIAGNOSIS — Z1211 Encounter for screening for malignant neoplasm of colon: Secondary | ICD-10-CM

## 2021-10-29 NOTE — Patient Instructions (Signed)

## 2021-10-30 LAB — FOLLICLE STIMULATING HORMONE: FSH: 61.2 m[IU]/mL

## 2021-10-30 LAB — ESTRADIOL: Estradiol: <15 pg/mL

## 2021-11-01 NOTE — Progress Notes (Unsigned)
PROVIDER NOTE: Information contained herein reflects review and annotations entered in association with encounter. Interpretation of such information and data should be left to medically-trained personnel. Information provided to patient can be located elsewhere in the medical record under "Patient Instructions". Document created using STT-dictation technology, any transcriptional errors that may result from process are unintentional.    Patient: Debra Myers  Service Category: E/M  Provider: Gaspar Cola, MD  DOB: 06/09/1965  DOS: 11/02/2021  Referring Provider: Pleas Koch, NP  MRN: [id number]  Specialty: Interventional Pain Management  PCP: Debra Koch, NP  Type: Established Patient  Setting: Ambulatory outpatient    Location: Office  Delivery: Face-to-face     HPI  [name], a 56 y.o. year old female, is here today because of her Chronic pain syndrome [G89.4]. Debra Myers primary complain today is No chief complaint on file. Last encounter: My last encounter with her was on 08/05/2021. Pertinent problems: Debra Myers has Numbness of upper extremity; Radiculitis involving upper extremity; Chronic low back pain (1ry area of Pain) (Bilateral) (L>R); Lumbar spondylosis (Bulging Disc & Severe Left Foraminal Stenosis at L3-4); Chronic lower extremity pain (2ry area of Pain) (Left); Lumbar foraminal stenosis (Severe Left L3-4); Lumbar facet syndrome (Bilateral) (L>R); Cervical spondylosis (C5-6 & C6-7 DDD); Cervical (3 mm) Grade 1 Anterolisthesis of C4 over C5; Chronic neck pain (3ry area of Pain) (Bilateral) (L>R); Chronic cervical radicular pain (Bilateral) (L>R); Cervical foraminal stenosis (multilevel) (Bilateral); Muscle spasm, nocturnal; Musculoskeletal pain; Carpal tunnel syndrome (Bilateral) (L>R); Cervical facet syndrome (Bilateral) (L>R); Chronic pain syndrome; DDD (degenerative disc disease), lumbar; DDD (degenerative disc disease), cervical; Pain in right knee; Chronic  pain of left knee; Osteoarthritis involving multiple joints; Acute exacerbation of chronic low back pain; and Pain in left knee on their pertinent problem list. Pain Assessment: Severity of   is reported as a  /10. Location:    / . Onset:  . Quality:  . Timing:  . Modifying factor(s):  Marland Kitchen Vitals:  vitals were not taken for this visit.   Reason for encounter: medication management. ***  RTCB: 02/12/2022 Nonopioids transferred to 12/24/2019: Skelaxin  Pharmacotherapy Assessment  Analgesic: Hydrocodone/APAP 5/325, 2 tab PO QD (10 mg/day of hydrocodone) MME/day: 10 mg/day.   Monitoring: [place] PMP: PDMP reviewed during this encounter.       Pharmacotherapy: No side-effects or adverse reactions reported. Compliance: No problems identified. Effectiveness: Clinically acceptable.  No notes on file  No results found for: "CBDTHCR" No results found for: "D8THCCBX" No results found for: "D9THCCBX"  UDS:  Summary  Date Value Ref Range Status  08/05/2021 Note  Final    Comment:    ==================================================================== [facility] (MW) ==================================================================== Test                             Result       Flag       Units  Drug Present and Declared for Prescription Verification   Amphetamine                    948          EXPECTED   ng/mg creat    Amphetamine is available as a schedule II prescription drug.    Lorazepam                      556  EXPECTED   ng/mg creat    Source of lorazepam is a scheduled prescription medication.    Hydrocodone                    667          EXPECTED   ng/mg creat   Dihydrocodeine                 102          EXPECTED   ng/mg creat   Norhydrocodone                 1229         EXPECTED   ng/mg creat    Sources of hydrocodone include scheduled prescription medications.    Dihydrocodeine and norhydrocodone are expected metabolites of    hydrocodone. Dihydrocodeine is  also available as a scheduled    prescription medication.  ==================================================================== Test                      Result    Flag   Units      Ref Range   Creatinine              95               mg/dL      >=20 ==================================================================== Declared Medications:  The flagging and interpretation on this report are based on the  following declared medications.  Unexpected results may arise from  inaccuracies in the declared medications.   **Note: The testing scope of this panel includes these medications:   Amphetamine (Vyvanse)  Hydrocodone (Norco)  Lorazepam (Ativan)   **Note: The testing scope of this panel does not include the  following reported medications:   Acetaminophen (Norco)  Albuterol  Aspirin  Atorvastatin  Bupropion (Wellbutrin XL)  Cetirizine (Zyrtec)  Fluticasone (Flonase)  Ibuprofen (Advil)  Lamotrigine (Lamictal)  Metaxalone (Skelaxin)  Metformin  Montelukast  Paroxetine (Paxil)  Spironolactone ==================================================================== For clinical consultation, please call [phone]. ====================================================================       ROS  Constitutional: Denies any fever or chills Gastrointestinal: No reported hemesis, hematochezia, vomiting, or acute GI distress Musculoskeletal: Denies any acute onset joint swelling, redness, loss of ROM, or weakness Neurological: No reported episodes of acute onset apraxia, aphasia, dysarthria, agnosia, amnesia, paralysis, loss of coordination, or loss of consciousness  Medication Review  HYDROcodone-acetaminophen, LORazepam, PARoxetine, albuterol, aspirin EC, atorvastatin, buPROPion, cetirizine, fluticasone, ibuprofen, lamoTRIgine, lisdexamfetamine, metFORMIN, metaxalone, montelukast, and spironolactone  History Review  Allergy: Debra Myers is allergic to oxycodone, penicillins,  red dye, and sulfa antibiotics. Drug: Debra Myers  reports no history of drug use. Alcohol:  reports no history of alcohol use. Tobacco:  reports that she has never smoked. She has never used smokeless tobacco. Social: Debra Myers  reports that she has never smoked. She has never used smokeless tobacco. She reports that she does not drink alcohol and does not use drugs. Medical:  has a past medical history of Anxiety, Asthma, Bulging lumbar disc (L3-4) (02/26/2015), Degenerative disc disease, Degenerative lumbar disc, Depression, Diabetes mellitus, Elective abortion, GERD (gastroesophageal reflux disease), Hypertension, Stroke ([place]) (06/11/2015), and Vaginal delivery. Surgical: Ms. Mcelhinney  has a past surgical history that includes Cholecystectomy (2001); Lapband (04/2012); Wisdom tooth extraction; Dilatation & curettage/hysteroscopy with myosure (N/A, 09/10/2014); mirena; and lap band surgery. Family: family history includes Asthma in her maternal grandmother; Cancer in her mother; Diabetes in her father; Hypertension in her father and mother;  Stroke in her maternal grandfather and maternal grandmother.  Laboratory Chemistry Profile   Renal Lab Results  Component Value Date   BUN 13 01/21/2021   CREATININE 0.92 94/50/3888   BCR NOT APPLICABLE 28/00/3491   GFR 70.59 01/21/2021   GFRAA >60 12/16/2017   GFRNONAA >60 12/16/2017    Hepatic Lab Results  Component Value Date   AST 16 01/21/2021   ALT 18 01/21/2021   ALBUMIN 4.4 01/21/2021   ALKPHOS 104 01/21/2021   HCVAB NEGATIVE 05/18/2013    Electrolytes Lab Results  Component Value Date   NA 141 01/21/2021   K 4.3 01/21/2021   CL 104 01/21/2021   CALCIUM 9.2 01/21/2021   PHOS 4.0 10/31/2015    Bone Lab Results  Component Value Date   VD25OH 25.55 (L) 01/21/2021    Inflammation (CRP: Acute Phase) (ESR: Chronic Phase) Lab Results  Component Value Date   ESRSEDRATE 32 05/26/2017         Note: Above Lab results reviewed.  Recent  Imaging Review  VAS [name] LOWER EXTREMITY VENOUS (DVT) ([facility] and [facility] 7a-7p)  Lower Venous Study  Indications: Pain.   Performing Technologist: Maudry Mayhew MHA, RDMS, RVT, RDCS    Examination Guidelines: A complete evaluation includes B-mode imaging, spectral Doppler, color Doppler, and power Doppler as needed of all accessible portions of each vessel. Bilateral testing is considered an integral part of a complete examination. Limited examinations for reoccurring indications may be performed as noted.    Right Venous Findings: +---+---------------+---------+-----------+----------+-------+    CompressibilityPhasicitySpontaneityPropertiesSummary +---+---------------+---------+-----------+----------+-------+ CFVFull           Yes      Yes                          +---+---------------+---------+-----------+----------+-------+     Left Venous Findings: +---------+---------------+---------+-----------+----------+-------+          CompressibilityPhasicitySpontaneityPropertiesSummary +---------+---------------+---------+-----------+----------+-------+ CFV      Full           Yes      Yes                          +---------+---------------+---------+-----------+----------+-------+ SFJ      Full                                                 +---------+---------------+---------+-----------+----------+-------+ FV Prox  Full                                                 +---------+---------------+---------+-----------+----------+-------+ FV Mid   Full                                                 +---------+---------------+---------+-----------+----------+-------+ FV DistalFull                                                 +---------+---------------+---------+-----------+----------+-------+ PFV      Full                                                  +---------+---------------+---------+-----------+----------+-------+  POP      Full           Yes      Yes                          +---------+---------------+---------+-----------+----------+-------+ PTV      Full                                                 +---------+---------------+---------+-----------+----------+-------+ PERO     Full                                                 +---------+---------------+---------+-----------+----------+-------+          Summary: Right: No evidence of common femoral vein obstruction. Left: There is no evidence of deep vein thrombosis in the lower extremity. No cystic structure found in the popliteal fossa.   *See table(s) above for measurements and observations.  Electronically signed by Monica Martinez MD on 12/16/2017 at 6:04:46 PM.      Final   Note: Reviewed        Physical Exam  General appearance: Well nourished, well developed, and well hydrated. In no apparent acute distress Mental status: Alert, oriented x 3 (person, place, & time)       Respiratory: No evidence of acute respiratory distress Eyes: PERLA Vitals: There were no vitals taken for this visit. BMI: Estimated body mass index is 41.1 kg/m as calculated from the following:   Height as of 10/29/21: '5\' 3"'  (1.6 m).   Weight as of 10/29/21: 232 lb (105.2 kg). Ideal: Ideal body weight: 52.4 kg (115 lb 8.3 oz) Adjusted ideal body weight: 73.5 kg (162 lb 1.8 oz)  Assessment   Diagnosis Status  1. Chronic pain syndrome   2. Chronic low back pain (1ry area of Pain) (Bilateral) (L>R)   3. Chronic lower extremity pain (2ry area of Pain) (Left)   4. Chronic neck pain (3ry area of Pain) (Bilateral) (L>R)   5. Cervical facet syndrome (Bilateral) (L>R)   6. Chronic cervical radicular pain (Bilateral) (L>R)   7. Lumbar facet syndrome (Bilateral) (L>R)   8. Musculoskeletal pain   9. Pharmacologic therapy   10. Chronic use of opiate for therapeutic  purpose   11. Encounter for medication management   12. Encounter for chronic pain management    Controlled Controlled Controlled   Updated Problems: No problems updated.  Plan of Care  Problem-specific:  No problem-specific Assessment & Plan notes found for this encounter.  Ms. SHEMIAH ROSCH has a current medication list which includes the following long-term medication(s): albuterol, atorvastatin, fluticasone, hydrocodone-acetaminophen, metaxalone, metformin, montelukast, and spironolactone.  Pharmacotherapy (Medications Ordered): No orders of the defined types were placed in this encounter.  Orders:  No orders of the defined types were placed in this encounter.  Follow-up plan:   No follow-ups on file.     Interventional management options: Planned, scheduled, and/or pending:      Considering:   Diagnostic bilateral lumbar facet block  Possible bilateral lumbar facet RFA.  Diagnostic left L3-4 LESI  Diagnostic left L3 TFESI  Diagnostic left L4 TFESI  Diagnostic bilateral L5 TFESI  Diagnostic left CESI  Diagnostic  bilateral cervical facet block  Possible bilateral cervical facet RFA.    Palliative PRN treatment(s):   None at this time     Recent Visits Date Type Provider Dept  08/05/21 Office Visit Milinda Pointer, MD [facility]  Showing recent visits within past 90 days and meeting all other requirements Future Appointments Date Type Provider Dept  11/02/21 Appointment Milinda Pointer, MD [facility]  Showing future appointments within next 90 days and meeting all other requirements  I discussed the assessment and treatment plan with the patient. The patient was provided an opportunity to ask questions and all were answered. The patient agreed with the plan and demonstrated an understanding of the instructions.  Patient advised to call back or seek an in-person evaluation if the symptoms or condition worsens.  Duration of  encounter: *** minutes.  Total time on encounter, as per AMA guidelines included both the face-to-face and non-face-to-face time personally spent by the physician and/or other qualified health care professional(s) on the day of the encounter (includes time in activities that require the physician or other qualified health care professional and does not include time in activities normally performed by clinical staff). Physician's time may include the following activities when performed: preparing to see the patient (eg, review of tests, pre-charting review of records) obtaining and/or reviewing separately obtained history performing a medically appropriate examination and/or evaluation counseling and educating the patient/family/caregiver ordering medications, tests, or procedures referring and communicating with other health care professionals (when not separately reported) documenting clinical information in the electronic or other health record independently interpreting results (not separately reported) and communicating results to the patient/ family/caregiver care coordination (not separately reported)  Note by: Debra Cola, MD Date: 11/02/2021; Time: 6:47 PM

## 2021-11-01 NOTE — Patient Instructions (Signed)

## 2021-11-02 ENCOUNTER — Ambulatory Visit (HOSPITAL_BASED_OUTPATIENT_CLINIC_OR_DEPARTMENT_OTHER): Payer: Managed Care, Other (non HMO) | Admitting: Pain Medicine

## 2021-11-02 DIAGNOSIS — M47816 Spondylosis without myelopathy or radiculopathy, lumbar region: Secondary | ICD-10-CM

## 2021-11-02 DIAGNOSIS — Z79899 Other long term (current) drug therapy: Secondary | ICD-10-CM

## 2021-11-02 DIAGNOSIS — M7918 Myalgia, other site: Secondary | ICD-10-CM

## 2021-11-02 DIAGNOSIS — G8929 Other chronic pain: Secondary | ICD-10-CM

## 2021-11-02 DIAGNOSIS — M47812 Spondylosis without myelopathy or radiculopathy, cervical region: Secondary | ICD-10-CM

## 2021-11-02 DIAGNOSIS — Z91199 Patient's noncompliance with other medical treatment and regimen due to unspecified reason: Secondary | ICD-10-CM

## 2021-11-02 DIAGNOSIS — Z79891 Long term (current) use of opiate analgesic: Secondary | ICD-10-CM

## 2021-11-02 DIAGNOSIS — M545 Low back pain, unspecified: Secondary | ICD-10-CM

## 2021-11-02 DIAGNOSIS — G894 Chronic pain syndrome: Secondary | ICD-10-CM

## 2021-11-18 ENCOUNTER — Other Ambulatory Visit: Payer: Self-pay | Admitting: *Deleted

## 2021-11-18 MED ORDER — MISOPROSTOL 200 MCG PO TABS: ORAL_TABLET | ORAL | 0 refills | Status: DC

## 2021-11-27 NOTE — Progress Notes (Signed)
GYNECOLOGY  VISIT   HPI: 56 y.o.   Married  Caucasian  female   [name] with No LMP recorded. (Menstrual status: IUD).   here for  IUD removal. She did use Cytotec.   Her cervix looked stenotic at her annual exam on 10/29/21. The stings were visible.   FSH 61.2 and E2 < 15 on 10/29/20.  IUD was placed in the OR at the time of hysteroscopy in 2016.  Patient had a pelvic US in 01/23/16 showing her IUD in the normal position in the endometrial canal.   GYNECOLOGIC HISTORY: No LMP recorded. (Menstrual status: IUD). Contraception:  mirena iud inserted 8/16 Menopausal hormone therapy:  none  Last mammogram:  06-29-21 neg birads 1:neg Last pap smear:   03-01-18 neg HPV HR neg, 05-08-13 neg HPV HR neg        OB History     Gravida  2   Para  1   Term  1   Preterm      AB  1   Living  1      SAB  1   IAB      Ectopic      Multiple      Live Births  1              Patient Active Problem List   Diagnosis Date Noted   History of bariatric surgery 01/21/2021   Chronic use of opiate for therapeutic purpose 07/27/2020   Acute exacerbation of chronic low back pain 12/24/2019   Binge eating disorder 11/23/2019   Uncomplicated opioid dependence (HCC) 11/07/2019   Pharmacologic therapy 08/07/2019   Disorder of skeletal system 08/07/2019   Problems influencing health status 08/07/2019   Osteoarthritis involving multiple joints 07/20/2018   Chronic pain of left knee 04/18/2018   Pain in right knee 01/09/2018   Pain in left knee 01/09/2018   DDD (degenerative disc disease), lumbar 04/11/2017   DDD (degenerative disc disease), cervical 04/11/2017   Well woman exam 09/15/2016   Chronic pain syndrome 01/30/2016   Morbid obesity with BMI of 40.0-44.9, adult (HCC) 01/30/2016   Muscle spasm, nocturnal 08/05/2015   Musculoskeletal pain 08/05/2015   Carpal tunnel syndrome (Bilateral) (L>R) 08/05/2015   Cervical facet syndrome (Bilateral) (L>R) 08/05/2015   History of TIA (transient  ischemic attack) 06/12/2015   Cervical spondylosis (C5-6 & C6-7 DDD) 03/04/2015   Cervical (3 mm) Grade 1 Anterolisthesis of C4 over C5 03/04/2015   Chronic neck pain (3ry area of Pain) (Bilateral) (L>R) 03/04/2015   Chronic cervical radicular pain (Bilateral) (L>R) 03/04/2015   Cervical foraminal stenosis (multilevel) (Bilateral) 03/04/2015   Long term current use of opiate analgesic 02/26/2015   Encounter for therapeutic drug level monitoring 02/26/2015   Encounter for chronic pain management 02/26/2015   Numbness of upper extremity 02/26/2015   Radiculitis involving upper extremity 02/26/2015   Chronic low back pain (1ry area of Pain) (Bilateral) (L>R) 02/26/2015   Lumbar spondylosis (Bulging Disc & Severe Left Foraminal Stenosis at L3-4) 02/26/2015   Chronic lower extremity pain (2ry area of Pain) (Left) 02/26/2015   Lumbar foraminal stenosis (Severe Left L3-4) 02/26/2015   Lumbar facet syndrome (Bilateral) (L>R) 02/26/2015   Opiate use (10 MME/Day) 12/02/2014   Long term prescription opiate use 12/02/2014   IUD (intrauterine device) in place 10/01/2014   GERD (gastroesophageal reflux disease) 01/30/2014   Overweight(278.02) 02/14/2012   Hypertension 07/09/2011   HYPERTRIGLYCERIDEMIA 12/27/2008   GOITER, NONTOXIC MULTINODULAR 12/14/2006   Prediabetes 12/14/2006  Polycystic ovaries 12/14/2006   Metabolic Syndrome X 12/14/2006   OBESITY 12/14/2006   Allergic rhinitis 12/14/2006   Asthma 12/14/2006   Hirsutism 12/14/2006   Generalized anxiety disorder 12/14/2006    Past Medical History:  Diagnosis Date   Anxiety    Asthma    ALLERGY INDUCED   Bulging lumbar disc (L3-4) 02/26/2015   Degenerative disc disease    Degenerative lumbar disc    Depression    Diabetes mellitus    TYPE 2   Elective abortion    ONE   GERD (gastroesophageal reflux disease)    takes prilosec    Hypertension    Stroke (HCC) 06/11/2015   patient describes as mini stroke   Vaginal delivery     ONE NSVD    Past Surgical History:  Procedure Laterality Date   CHOLECYSTECTOMY  2001   DILATATION & CURETTAGE/HYSTEROSCOPY WITH MYOSURE N/A 09/10/2014   Procedure: DILATATION & CURETTAGE/HYSTEROSCOPY WITH MYOSURE/INSERTION OF IUD;  Surgeon: Ok Edwards, MD;  Location: [facility];  Service: Gynecology;  Laterality: N/A;   lap band surgery     Lapband  04/2012   mirena     inserted 09-2014   WISDOM TOOTH EXTRACTION      Current Outpatient Medications  Medication Sig Dispense Refill   albuterol (VENTOLIN HFA) 108 (90 Base) MCG/ACT inhaler Inhale 2 puffs into the lungs every 6 (six) hours as needed for wheezing or shortness of breath. 18 g 0   atorvastatin (LIPITOR) 10 MG tablet TAKE 1 TABLET(10 MG) BY MOUTH DAILY for cholesterol. 90 tablet 3   buPROPion (WELLBUTRIN XL) 300 MG 24 hr tablet Take 300 mg by mouth daily.     cetirizine (ZYRTEC) 10 MG tablet Take 10 mg by mouth at bedtime.     fluticasone (FLONASE) 50 MCG/ACT nasal spray SHAKE LIQUID AND USE 1 SPRAY IN EACH NOSTRIL TWICE DAILY AS NEEDED FOR ALLERGIES OR RHINITIS 16 g 3   ibuprofen (ADVIL) 200 MG tablet Take 400 mg by mouth every 8 (eight) hours as needed.     lamoTRIgine (LAMICTAL) 200 MG tablet Take 300 mg by mouth at bedtime.      LORazepam (ATIVAN) 1 MG tablet Take 0.5-1 mg by mouth every 8 (eight) hours as needed for anxiety or sleep.      metaxalone (SKELAXIN) 800 MG tablet TAKE 1 TABLET(800 MG) BY MOUTH THREE TIMES DAILY AS NEEDED FOR MUSCLE SPASMS 270 tablet 0   metFORMIN (GLUCOPHAGE XR) 500 MG 24 hr tablet Take 1 tablet (500 mg total) by mouth daily with breakfast. 90 tablet 3   misoprostol (CYTOTEC) 200 MCG tablet Insert one tablet vaginally night before IUD removal then one tablet the am of the IUD removal. 2 tablet 0   montelukast (SINGULAIR) 10 MG tablet Take 1 tablet (10 mg total) by mouth at bedtime. For allergies. 90 tablet 3   PARoxetine (PAXIL-CR) 37.5 MG 24 hr tablet Take 2 tablets by mouth daily.     RA ASPIRIN  EC ADULT LOW ST 81 MG EC tablet take 1 tablet by mouth once daily 30 tablet 0   spironolactone (ALDACTONE) 25 MG tablet TAKE 1 TABLET(25 MG) BY MOUTH DAILY 90 tablet 3   VYVANSE 40 MG capsule Take 40 mg by mouth daily.  0   HYDROcodone-acetaminophen (NORCO/VICODIN) 5-325 MG tablet Take 0.5-1 tablets by mouth 2 (two) times daily as needed for severe pain. Must last 30 days 60 tablet 0   No current facility-administered medications for this visit.  ALLERGIES: Oxycodone, Penicillins, Red dye, and Sulfa antibiotics  Family History  Problem Relation Age of Onset   Cancer Mother        thyroid cancer   Hypertension Mother    Hypertension Father    Diabetes Father    Asthma Maternal Grandmother    Stroke Maternal Grandmother    Stroke Maternal Grandfather     Social History   Socioeconomic History   Marital status: Married    Spouse name: Not on file   Number of children: Not on file   Years of education: Not on file   Highest education level: Not on file  Occupational History   Not on file  Tobacco Use   Smoking status: Never   Smokeless tobacco: Never  Vaping Use   Vaping Use: Never used  Substance and Sexual Activity   Alcohol use: Never    Alcohol/week: 0.0 standard drinks of alcohol   Drug use: No   Sexual activity: Yes    Birth control/protection: I.U.D.    Comment: 1st intercourse 56 yo-Fewer than 5 partners-Mirena  inserted 09-2014  Other Topics Concern   Not on file  Social History Narrative   Not on file   Social Determinants of Health   Financial Resource Strain: Not on file  Food Insecurity: Not on file  Transportation Needs: Not on file  Physical Activity: Not on file  Stress: Not on file  Social Connections: Not on file  Intimate Partner Violence: Not on file    Review of Systems  All other systems reviewed and are negative.   PHYSICAL EXAMINATION:    BP 136/82   Pulse 66   Wt 233 lb (105.7 kg)   SpO2 100%   BMI 41.27 kg/m     General  appearance: alert, cooperative and appears stated age Head: Normocephalic, without obvious abnormality, atraumatic Lungs: clear to auscultation bilaterally Heart: regular rate and rhythm Abdomen: soft, non-tender, no masses,  no organomegaly   Pelvic: External genitalia:  no lesions              Urethra:  normal appearing urethra with no masses, tenderness or lesions              Bartholins and Skenes: normal                 Vagina: normal appearing vagina with normal color and discharge, no lesions              Cervix: no lesions.  IUD strings visible.                 Bimanual Exam:  Uterus:  normal size, contour, position, consistency, mobility, non-tender              Adnexa: no mass, fullness, tenderness           IUD removal - procedure abandoned.  Consent for removal.   Sterile prep of cervix with Hibiclens.  Paracervical block with 10 cc 1% lidocaine, lot [id number], exp 02/23/23. Tenaculum to anterior cervical lip.  Os finder and hegar dilators used to dilate the cervix.  Ring forceps, dressing forceps and IUD removed used.  Strings broke off and small piece of the IUD stem (ring) came off.  Procedure abandoned.  Chaperone was present for exam:  Joy, CMA  ASSESSMENT  IUD removal.  Procedure abandoned.  Cervical stenosis.   PLAN  Will plan for removal in the OR under anesthesia with hysteroscopic guidance.  Risks and and benefits  reviewed.  Risks may include but are not limited to bleeding, infection, damage to surrounding organs including uterine perforation, pulmonary edema, and need for further treatment or surgical care.  She wishes to proceed.  Rx for Cytotec 200 mcg night before procedure and then 200 mcg the am of the procedure.  Stop ASA.     An After Visit Summary was printed and given to the patient.  20 min  total time was spent for this patient encounter, including preparation, face-to-face counseling with the patient, coordination of care, and documentation of  the encounter in addition to attempted IUD removal.

## 2021-11-30 ENCOUNTER — Encounter: Payer: Self-pay | Admitting: Obstetrics and Gynecology

## 2021-11-30 ENCOUNTER — Telehealth: Payer: Self-pay | Admitting: Obstetrics and Gynecology

## 2021-11-30 ENCOUNTER — Ambulatory Visit: Payer: Managed Care, Other (non HMO) | Admitting: Obstetrics and Gynecology

## 2021-11-30 VITALS — BP 136/82 | HR 66 | Wt 233.0 lb

## 2021-11-30 DIAGNOSIS — T8339XA Other mechanical complication of intrauterine contraceptive device, initial encounter: Secondary | ICD-10-CM | POA: Diagnosis not present

## 2021-11-30 DIAGNOSIS — N882 Stricture and stenosis of cervix uteri: Secondary | ICD-10-CM

## 2021-11-30 MED ORDER — MISOPROSTOL 200 MCG PO TABS: ORAL_TABLET | ORAL | 0 refills | Status: DC

## 2021-11-30 NOTE — Telephone Encounter (Signed)
Please precert and schedule hysteroscopic IUD removal and cervical dilation at [facility].   Diagnoses are:  retained IUD and cervical stenosis.   Time needed:  45 minutes.

## 2021-12-01 NOTE — Telephone Encounter (Signed)
Left message to call Dirck Butch, RN at [facility], [phone].  

## 2021-12-02 NOTE — Telephone Encounter (Signed)
Spoke with patient.   Covid positive 11/30/21  Symptoms still present. Hx of asthma. Symptoms started 10 days prior to positive test.   Advised patient will need to reschedule surgery. I will contact [facility] pre-op to review recommendations for reschedule. Discussed surgery date of 10/30 if possible. Advised patient I will f/u on 12/03/21. Patient appreciative of call.   Routing to Dr. Antony Blackbird.

## 2021-12-02 NOTE — Telephone Encounter (Signed)
No return call from patient.  Left message to call Sharee Pimple, RN at [place], [phone].   MyChart message to patient.

## 2021-12-03 NOTE — Telephone Encounter (Signed)
Thank you for the update!

## 2021-12-03 NOTE — Telephone Encounter (Signed)
Reviewed with [facility] pre-op nurse, Langley Gauss. Patient will need to reschedule at least 10 days from positive test date.

## 2021-12-03 NOTE — Telephone Encounter (Signed)
Call to patient to provide update.  Left detailed message. Advised of guidelines per [facility] pre-op. I have rescheduled surgery to 12/21/21. Please return call to further discuss.

## 2021-12-05 NOTE — Progress Notes (Unsigned)
PROVIDER NOTE: Information contained herein reflects review and annotations entered in association with encounter. Interpretation of such information and data should be left to medically-trained personnel. Information provided to patient can be located elsewhere in the medical record under "Patient Instructions". Document created using STT-dictation technology, any transcriptional errors that may result from process are unintentional.    Patient: [name]  Service Category: E/M  Provider: Oswaldo Done, MD  DOB: 05-24-1965  DOS: 12/09/2021  Referring Provider: Doreene Nest, NP  MRN: [id number]  Specialty: Interventional Pain Management  PCP: Doreene Nest, NP  Type: Established Patient  Setting: Ambulatory outpatient    Location: Office  Delivery: Face-to-face     HPI  Ms. [name], a 56 y.o. year old female, is here today because of her No primary diagnosis found.. Ms. Bergland's primary complain today is No chief complaint on file. Last encounter: My last encounter with her was on 11/02/2021. Pertinent problems: Ms. 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 Pain Assessment: Severity of   is reported as a  /10. Location:    / . Onset:  . Quality:  . Timing:  . Modifying factor(s):  Marland Kitchen Vitals:  vitals were not taken for this visit.   Reason for encounter: medication management. ***  Pharmacotherapy Assessment  Analgesic: Hydrocodone/APAP 5/325, 2 tab PO QD (10 mg/day of hydrocodone) MME/day: 10 mg/day.   Monitoring: [place] PMP: PDMP reviewed during this encounter.       Pharmacotherapy: No side-effects or adverse reactions reported. Compliance: No problems identified. Effectiveness: Clinically acceptable.  No notes on file  No results found for: "CBDTHCR" No results found for: "D8THCCBX" No results found for: "D9THCCBX"  UDS:  Summary  Date Value Ref Range Status  08/05/2021 Note  Final    Comment:    ==================================================================== [employer] [facility] (MW) ==================================================================== Test                             Result       Flag       Units  Drug Present and Declared for Prescription Verification   Amphetamine                    948          EXPECTED   ng/mg creat    Amphetamine is available as a schedule II prescription drug.    Lorazepam                      556          EXPECTED   ng/mg creat  Source of lorazepam is a scheduled prescription medication.    Hydrocodone                    667          EXPECTED   ng/mg creat   Dihydrocodeine                 102          EXPECTED   ng/mg creat   Norhydrocodone                 1229         EXPECTED   ng/mg creat    Sources of hydrocodone include scheduled prescription medications.    Dihydrocodeine and norhydrocodone are expected metabolites of    hydrocodone. Dihydrocodeine is also available as a scheduled    prescription  medication.  ==================================================================== Test                      Result    Flag   Units      Ref Range   Creatinine              95               mg/dL      >=20 ==================================================================== Declared Medications:  The flagging and interpretation on this report are based on the  following declared medications.  Unexpected results may arise from  inaccuracies in the declared medications.   **Note: The testing scope of this panel includes these medications:   Amphetamine (Vyvanse)  Hydrocodone (Norco)  Lorazepam (Ativan)   **Note: The testing scope of this panel does not include the  following reported medications:   Acetaminophen (Norco)  Albuterol  Aspirin  Atorvastatin  Bupropion (Wellbutrin XL)  Cetirizine (Zyrtec)  Fluticasone (Flonase)  Ibuprofen (Advil)  Lamotrigine (Lamictal)  Metaxalone (Skelaxin)  Metformin  Montelukast  Paroxetine (Paxil)  Spironolactone ==================================================================== For clinical consultation, please call [phone]. ====================================================================       ROS  Constitutional: Denies any fever or chills Gastrointestinal: No reported hemesis, hematochezia, vomiting, or acute GI distress Musculoskeletal: Denies any acute onset joint swelling, redness, loss of ROM, or weakness Neurological: No reported episodes of acute onset apraxia, aphasia, dysarthria, agnosia, amnesia, paralysis, loss of coordination, or loss of consciousness  Medication Review  HYDROcodone-acetaminophen, LORazepam, PARoxetine, albuterol, aspirin EC, atorvastatin, buPROPion, cetirizine, fluticasone, ibuprofen, lamoTRIgine, lisdexamfetamine, metFORMIN, metaxalone, misoprostol, montelukast, and spironolactone  History Review  Allergy: Ms. Schoff is allergic to oxycodone, penicillins, red dye, and sulfa  antibiotics. Drug: Ms. Malatesta  reports no history of drug use. Alcohol:  reports no history of alcohol use. Tobacco:  reports that she has never smoked. She has never used smokeless tobacco. Social: Ms. Dolinar  reports that she has never smoked. She has never used smokeless tobacco. She reports that she does not drink alcohol and does not use drugs. Medical:  has a past medical history of Anxiety, Asthma, Bulging lumbar disc (L3-4) (02/26/2015), Degenerative disc disease, Degenerative lumbar disc, Depression, Diabetes mellitus, Elective abortion, GERD (gastroesophageal reflux disease), Hypertension, Stroke ([place]) (06/11/2015), and Vaginal delivery. Surgical: Ms. Treloar  has a past surgical history that includes Cholecystectomy (2001); Lapband (04/2012); Wisdom tooth extraction; Dilatation & curettage/hysteroscopy with myosure (N/A, 09/10/2014); mirena; and lap band surgery. Family: family history includes Asthma in her maternal grandmother; Cancer in her mother; Diabetes in her father; Hypertension in her father and mother; Stroke in her maternal grandfather and maternal  grandmother.  Laboratory Chemistry Profile   Renal Lab Results  Component Value Date   BUN 13 01/21/2021   CREATININE 0.92 43/32/9518   BCR NOT APPLICABLE 84/16/6063   GFR 70.59 01/21/2021   GFRAA >60 12/16/2017   GFRNONAA >60 12/16/2017    Hepatic Lab Results  Component Value Date   AST 16 01/21/2021   ALT 18 01/21/2021   ALBUMIN 4.4 01/21/2021   ALKPHOS 104 01/21/2021   HCVAB NEGATIVE 05/18/2013    Electrolytes Lab Results  Component Value Date   NA 141 01/21/2021   K 4.3 01/21/2021   CL 104 01/21/2021   CALCIUM 9.2 01/21/2021   PHOS 4.0 10/31/2015    Bone Lab Results  Component Value Date   VD25OH 25.55 (L) 01/21/2021    Inflammation (CRP: Acute Phase) (ESR: Chronic Phase) Lab Results  Component Value Date   ESRSEDRATE 32 05/26/2017         Note: Above Lab results reviewed.  Recent Imaging Review  VAS  [name] LOWER EXTREMITY VENOUS (DVT) ([facility] and [facility] 7a-7p)  Lower Venous Study  Indications: Pain.   Performing Technologist: Maudry Mayhew MHA, RDMS, RVT, RDCS    Examination Guidelines: A complete evaluation includes B-mode imaging, spectral Doppler, color Doppler, and power Doppler as needed of all accessible portions of each vessel. Bilateral testing is considered an integral part of a complete examination. Limited examinations for reoccurring indications may be performed as noted.    Right Venous Findings: +---+---------------+---------+-----------+----------+-------+    CompressibilityPhasicitySpontaneityPropertiesSummary +---+---------------+---------+-----------+----------+-------+ CFVFull           Yes      Yes                          +---+---------------+---------+-----------+----------+-------+     Left Venous Findings: +---------+---------------+---------+-----------+----------+-------+          CompressibilityPhasicitySpontaneityPropertiesSummary +---------+---------------+---------+-----------+----------+-------+ CFV      Full           Yes      Yes                          +---------+---------------+---------+-----------+----------+-------+ SFJ      Full                                                 +---------+---------------+---------+-----------+----------+-------+ FV Prox  Full                                                 +---------+---------------+---------+-----------+----------+-------+ FV Mid   Full                                                 +---------+---------------+---------+-----------+----------+-------+ FV DistalFull                                                 +---------+---------------+---------+-----------+----------+-------+ PFV      Full                                                 +---------+---------------+---------+-----------+----------+-------+  POP      Full            Yes      Yes                          +---------+---------------+---------+-----------+----------+-------+ PTV      Full                                                 +---------+---------------+---------+-----------+----------+-------+ PERO     Full                                                 +---------+---------------+---------+-----------+----------+-------+          Summary: Right: No evidence of common femoral vein obstruction. Left: There is no evidence of deep vein thrombosis in the lower extremity. No cystic structure found in the popliteal fossa.   *See table(s) above for measurements and observations.  Electronically signed by Monica Martinez MD on 12/16/2017 at 6:04:46 PM.      Final   Note: Reviewed        Physical Exam  General appearance: Well nourished, well developed, and well hydrated. In no apparent acute distress Mental status: Alert, oriented x 3 (person, place, & time)       Respiratory: No evidence of acute respiratory distress Eyes: PERLA Vitals: There were no vitals taken for this visit. BMI: Estimated body mass index is 41.27 kg/m as calculated from the following:   Height as of 10/29/21: _0  (1.6 m).   Weight as of 11/30/21: 233 lb (105.7 kg). Ideal: Ideal body weight: 52.4 kg (115 lb 8.3 oz) Adjusted ideal body weight: 73.7 kg (162 lb 8.2 oz)  Assessment   Diagnosis Status  No diagnosis found. Controlled Controlled Controlled   Updated Problems: No problems updated.   Plan of Care  Problem-specific:  No problem-specific Assessment & Plan notes found for this encounter.  Ms. DONALDA JOB has a current medication list which includes the following long-term medication(s): albuterol, atorvastatin, fluticasone, hydrocodone-acetaminophen, metaxalone, metformin, misoprostol, montelukast, and spironolactone.  Pharmacotherapy (Medications Ordered): No orders of the defined types were placed in this  encounter.  Orders:  No orders of the defined types were placed in this encounter.  Follow-up plan:   No follow-ups on file.     Interventional management options: Planned, scheduled, and/or pending:      Considering:   Diagnostic bilateral lumbar facet block  Possible bilateral lumbar facet RFA.  Diagnostic left L3-4 LESI  Diagnostic left L3 TFESI  Diagnostic left L4 TFESI  Diagnostic bilateral L5 TFESI  Diagnostic left CESI  Diagnostic bilateral cervical facet block  Possible bilateral cervical facet RFA.    Palliative PRN treatment(s):   None at this time     Recent Visits No visits were found meeting these conditions. Showing recent visits within past 90 days and meeting all other requirements Future Appointments Date Type Provider Dept  12/09/21 Appointment Milinda Pointer, MD [facility]  Showing future appointments within next 90 days and meeting all other requirements  I discussed the assessment and treatment plan with the patient. The patient was provided an opportunity to ask questions and all were  answered. The patient agreed with the plan and demonstrated an understanding of the instructions.  Patient advised to call back or seek an in-person evaluation if the symptoms or condition worsens.  Duration of encounter: *** minutes.  Total time on encounter, as per AMA guidelines included both the face-to-face and non-face-to-face time personally spent by the physician and/or other qualified health care professional(s) on the day of the encounter (includes time in activities that require the physician or other qualified health care professional and does not include time in activities normally performed by clinical staff). Physician's time may include the following activities when performed: preparing to see the patient (eg, review of tests, pre-charting review of records) obtaining and/or reviewing separately obtained history performing a medically  appropriate examination and/or evaluation counseling and educating the patient/family/caregiver ordering medications, tests, or procedures referring and communicating with other health care professionals (when not separately reported) documenting clinical information in the electronic or other health record independently interpreting results (not separately reported) and communicating results to the patient/ family/caregiver care coordination (not separately reported)  Note by: Gaspar Cola, MD Date: 12/09/2021; Time: 10:08 AM

## 2021-12-08 NOTE — Patient Instructions (Signed)
Naloxone Nasal Spray What is this medication? NALOXONE (nal OX one) treats opioid overdose, which causes slow or shallow breathing, severe drowsiness, or trouble staying awake. Call emergency services after using this medication. You may need additional treatment. Naloxone works by reversing the effects of opioids. It belongs to a group of medications called opioid blockers. This medicine may be used for other purposes; ask your health care provider or pharmacist if you have questions. COMMON BRAND NAME(S): Kloxxado, Narcan What should I tell my care team before I take this medication? They need to know if you have any of these conditions: Heart disease Substance use disorder An unusual or allergic reaction to naloxone, other medications, foods, dyes, or preservatives Pregnant or trying to get pregnant Breast-feeding How should I use this medication? This medication is for use in the nose. Lay the person on their back. Support their neck with your hand and allow the head to tilt back before giving the medication. The nasal spray should be given into 1 nostril. After giving the medication, move the person onto their side. Do not remove or test the nasal spray until ready to use. Get emergency medical help right away after giving the first dose of this medication, even if the person wakes up. You should be familiar with how to recognize the signs and symptoms of a narcotic overdose. If more doses are needed, give the additional dose in the other nostril. Talk to your care team about the use of this medication in children. While this medication may be prescribed for children as young as newborns for selected conditions, precautions do apply. Overdosage: If you think you have taken too much of this medicine contact a poison control center or emergency room at once. NOTE: This medicine is only for you. Do not share this medicine with others. What if I miss a dose? This does not apply. What may  interact with this medication? This is only used during an emergency. No interactions are expected during emergency use. This list may not describe all possible interactions. Give your health care provider a list of all the medicines, herbs, non-prescription drugs, or dietary supplements you use. Also tell them if you smoke, drink alcohol, or use illegal drugs. Some items may interact with your medicine. What should I watch for while using this medication? Keep this medication ready for use in the case of an opioid overdose. Make sure that you have the phone number of your care team and local hospital ready. You may need to have additional doses of this medication. Each nasal spray contains a single dose. Some emergencies may require additional doses. After use, bring the treated person to the nearest hospital or call 911. Make sure the treating care team knows that the person has received a dose of this medication. You will receive additional instructions on what to do during and after use of this medication before an emergency occurs. What side effects may I notice from receiving this medication? Side effects that you should report to your care team as soon as possible: Allergic reactions--skin rash, itching, hives, swelling of the face, lips, tongue, or throat Side effects that usually do not require medical attention (report these to your care team if they continue or are bothersome): Constipation Dryness or irritation inside the nose Headache Increase in blood pressure Muscle spasms Stuffy nose Toothache This list may not describe all possible side effects. Call your doctor for medical advice about side effects. You may report side effects to FDA   at 1-800-FDA-1088. Where should I keep my medication? Keep out of the reach of children and pets. Store between 20 and 25 degrees C (68 and 77 degrees F). Do not freeze. Throw away any unused medication after the expiration date. Keep in original  box until ready to use. NOTE: This sheet is a summary. It may not cover all possible information. If you have questions about this medicine, talk to your doctor, pharmacist, or health care provider.  2023 Elsevier/Gold Standard (2021-01-05 00:00:00) ____________________________________________________________________________________________  Medication Rules  Purpose: To inform patients, and their family members, of our rules and regulations.  Applies to: All patients receiving prescriptions (written or electronic).  Pharmacy of record: Pharmacy where electronic prescriptions will be sent. If written prescriptions are taken to a different pharmacy, please inform the nursing staff. The pharmacy listed in the electronic medical record should be the one where you would like electronic prescriptions to be sent.  Electronic prescriptions: In compliance with the [place] Strengthen Opioid Misuse Prevention (STOP) Act of 2017 (Session Law 2017-74/H243), effective February 22, 2018, all controlled substances must be electronically prescribed. Calling prescriptions to the pharmacy will cease to exist.  Prescription refills: Only during scheduled appointments. Applies to all prescriptions.  NOTE: The following applies primarily to controlled substances (Opioid* Pain Medications).   Type of encounter (visit): For patients receiving controlled substances, face-to-face visits are required. (Not an option or up to the patient.)  Patient's responsibilities: Pain Pills: Bring all pain pills to every appointment (except for procedure appointments). Pill Bottles: Bring pills in original pharmacy bottle. Always bring the newest bottle. Bring bottle, even if empty. Medication refills: You are responsible for knowing and keeping track of what medications you take and those you need refilled. The day before your appointment: write a list of all prescriptions that need to be refilled. The day of the  appointment: give the list to the admitting nurse. Prescriptions will be written only during appointments. No prescriptions will be written on procedure days. If you forget a medication: it will not be "Called in", "Faxed", or "electronically sent". You will need to get another appointment to get these prescribed. No early refills. Do not call asking to have your prescription filled early. Prescription Accuracy: You are responsible for carefully inspecting your prescriptions before leaving our office. Have the discharge nurse carefully go over each prescription with you, before taking them home. Make sure that your name is accurately spelled, that your address is correct. Check the name and dose of your medication to make sure it is accurate. Check the number of pills, and the written instructions to make sure they are clear and accurate. Make sure that you are given enough medication to last until your next medication refill appointment. Taking Medication: Take medication as prescribed. When it comes to controlled substances, taking less pills or less frequently than prescribed is permitted and encouraged. Never take more pills than instructed. Never take medication more frequently than prescribed.  Inform other Doctors: Always inform, all of your healthcare providers, of all the medications you take. Pain Medication from other Providers: You are not allowed to accept any additional pain medication from any other Doctor or Healthcare provider. There are two exceptions to this rule. (see below) In the event that you require additional pain medication, you are responsible for notifying us, as stated below. Cough Medicine: Often these contain an opioid, such as codeine or hydrocodone. Never accept or take cough medicine containing these opioids if you are already taking   an opioid* medication. The combination may cause respiratory failure and death. Medication Agreement: You are responsible for carefully  reading and following our Medication Agreement. This must be signed before receiving any prescriptions from our practice. Safely store a copy of your signed Agreement. Violations to the Agreement will result in no further prescriptions. (Additional copies of our Medication Agreement are available upon request.) Laws, Rules, & Regulations: All patients are expected to follow all Federal and State Laws, Statutes, Rules, & Regulations. Ignorance of the Laws does not constitute a valid excuse.  Illegal drugs and Controlled Substances: The use of illegal substances (including, but not limited to marijuana and its derivatives) and/or the illegal use of any controlled substances is strictly prohibited. Violation of this rule may result in the immediate and permanent discontinuation of any and all prescriptions being written by our practice. The use of any illegal substances is prohibited. Adopted CDC guidelines & recommendations: Target dosing levels will be at or below 60 MME/day. Use of benzodiazepines** is not recommended.  Exceptions: There are only two exceptions to the rule of not receiving pain medications from other Healthcare Providers. Exception #1 (Emergencies): In the event of an emergency (i.e.: accident requiring emergency care), you are allowed to receive additional pain medication. However, you are responsible for: As soon as you are able, call our office [phone], at any time of the day or night, and leave a message stating your name, the date and nature of the emergency, and the name and dose of the medication prescribed. In the event that your call is answered by a member of our staff, make sure to document and save the date, time, and the name of the person that took your information.  Exception #2 (Planned Surgery): In the event that you are scheduled by another doctor or dentist to have any type of surgery or procedure, you are allowed (for a period no longer than 30 days), to receive  additional pain medication, for the acute post-op pain. However, in this case, you are responsible for picking up a copy of our "Post-op Pain Management for Surgeons" handout, and giving it to your surgeon or dentist. This document is available at our office, and does not require an appointment to obtain it. Simply go to our office during business hours (Monday-Thursday from 8:00 AM to 4:00 PM) (Friday 8:00 AM to 12:00 Noon) or if you have a scheduled appointment with us, prior to your surgery, and ask for it by name. In addition, you are responsible for: calling our office [phone], at any time of the day or night, and leaving a message stating your name, name of your surgeon, type of surgery, and date of procedure or surgery. Failure to comply with your responsibilities may result in termination of therapy involving the controlled substances. Medication Agreement Violation. Following the above rules, including your responsibilities will help you in avoiding a Medication Agreement Violation ("Breaking your Pain Medication Contract").  *Opioid medications include: morphine, codeine, oxycodone, oxymorphone, hydrocodone, hydromorphone, meperidine, tramadol, tapentadol, buprenorphine, fentanyl, methadone. **Benzodiazepine medications include: diazepam (Valium), alprazolam (Xanax), clonazepam (Klonopine), lorazepam (Ativan), clorazepate (Tranxene), chlordiazepoxide (Librium), estazolam (Prosom), oxazepam (Serax), temazepam (Restoril), triazolam (Halcion) (Last updated: 11/19/2020) ____________________________________________________________________________________________  ____________________________________________________________________________________________  Medication Recommendations and Reminders  Applies to: All patients receiving prescriptions (written and/or electronic).  Medication Rules & Regulations: These rules and regulations exist for your safety and that of others. They are not  flexible and neither are we. Dismissing or ignoring them will be considered "non-compliance"   with medication therapy, resulting in complete and irreversible termination of such therapy. (See document titled "Medication Rules" for more details.) In all conscience, because of safety reasons, we cannot continue providing a therapy where the patient does not follow instructions.  Pharmacy of record:  Definition: This is the pharmacy where your electronic prescriptions will be sent.  We do not endorse any particular pharmacy, however, we have experienced problems with [facility] not securing enough medication supply for the community. We do not restrict you in your choice of pharmacy. However, once we write for your prescriptions, we will NOT be re-sending more prescriptions to fix restricted supply problems created by your pharmacy, or your insurance.  The pharmacy listed in the electronic medical record should be the one where you want electronic prescriptions to be sent. If you choose to change pharmacy, simply notify our nursing staff.  Recommendations: Keep all of your pain medications in a safe place, under lock and key, even if you live alone. We will NOT replace lost, stolen, or damaged medication. After you fill your prescription, take 1 week's worth of pills and put them away in a safe place. You should keep a separate, properly labeled bottle for this purpose. The remainder should be kept in the original bottle. Use this as your primary supply, until it runs out. Once it's gone, then you know that you have 1 week's worth of medicine, and it is time to come in for a prescription refill. If you do this correctly, it is unlikely that you will ever run out of medicine. To make sure that the above recommendation works, it is very important that you make sure your medication refill appointments are scheduled at least 1 week before you run out of medicine. To do this in an effective manner, make sure that  you do not leave the office without scheduling your next medication management appointment. Always ask the nursing staff to show you in your prescription , when your medication will be running out. Then arrange for the receptionist to get you a return appointment, at least 7 days before you run out of medicine. Do not wait until you have 1 or 2 pills left, to come in. This is very poor planning and does not take into consideration that we may need to cancel appointments due to bad weather, sickness, or emergencies affecting our staff. DO NOT ACCEPT A "Partial Fill": If for any reason your pharmacy does not have enough pills/tablets to completely fill or refill your prescription, do not allow for a "partial fill". The law allows the pharmacy to complete that prescription within 72 hours, without requiring a new prescription. If they do not fill the rest of your prescription within those 72 hours, you will need a separate prescription to fill the remaining amount, which we will NOT provide. If the reason for the partial fill is your insurance, you will need to talk to the pharmacist about payment alternatives for the remaining tablets, but again, DO NOT ACCEPT A PARTIAL FILL, unless you can trust your pharmacist to obtain the remainder of the pills within 72 hours.  Prescription refills and/or changes in medication(s):  Prescription refills, and/or changes in dose or medication, will be conducted only during scheduled medication management appointments. (Applies to both, written and electronic prescriptions.) No refills on procedure days. No medication will be changed or started on procedure days. No changes, adjustments, and/or refills will be conducted on a procedure day. Doing so will interfere with the diagnostic portion   of the procedure. No phone refills. No medications will be "called into the pharmacy". No Fax refills. No weekend refills. No Holliday refills. No after hours refills.  Remember:   Business hours are:  Monday to Thursday 8:00 AM to 4:00 PM Provider's Schedule: Gevin Perea, MD - Appointments are:  Medication management: Monday and Wednesday 8:00 AM to 4:00 PM Procedure day: Tuesday and Thursday 7:30 AM to 4:00 PM Bilal Lateef, MD - Appointments are:  Medication management: Tuesday and Thursday 8:00 AM to 4:00 PM Procedure day: Monday and Wednesday 7:30 AM to 4:00 PM (Last update: 09/12/2019) ____________________________________________________________________________________________  ____________________________________________________________________________________________  Pharmacy Shortages of Pain Medication   Introduction Shockingly as it may seem, .  "No U.[place] Supreme Court decision has ever interpreted the Constitution as guaranteeing a right to health care for all Americans." - https://www.healthequityandpolicylab.com/elusive-right-to-health-care-under-us-law  "With respect to human rights, the [place] has no formally codified right to health, nor does it participate in a human rights treaty that specifies a right to health." - Scott J. Schweikart, JD, MBE  Situation By now, most of our patients have had the experience of being told by their pharmacist that they do not have enough medication to cover their prescription. If you have not had this experience, just know that you soon will.  Problem There appears to be a shortage of these medications, either at the national level or locally. This is happening with all pharmacies. When there is not enough medication, patients are offered a partial fill and they are told that they will try to get the rest of the medicine for them at a later time. If they do not have enough for even a partial fill, the pharmacists are telling the patients to call us (the prescribing physicians) to request that we send another prescription to another pharmacy to get the medicine.   This reordering of a controlled  substance creates documentation problems where additional paperwork needs to be created to explain why two prescriptions for the same period of time and the same medicine are being prescribed to the same patient. It also creates situations where the last appointment note does not accurately reflect when and what prescriptions were given to a patient. This leads to prescribing errors down the line, in subsequent follow-up visits.   [place] Board of Pharmacy ([place]BOP) Research revealed that Board of Pharmacy Rule .1806 (21 NCAC 46.1806) authorizes pharmacists to the transfer of prescriptions among pharmacies, and it sets forth procedural and recordkeeping requirements for doing so. However, this requires the pharmacist to complete the previously mentioned procedural paperwork to accomplish the transfer. As it turns out, it is much easier for them to have the prescribing physicians do the work.   Possible solutions 1. You can ask your physician to assist you in weaning yourself off these medications. 2. Ask your pharmacy if the medication is in stock, 3 days prior to your refill. 3. If you need a pharmacy change, let us know at your medication management visit. Prescriptions that have already been electronically sent to a pharmacy will not be re-sent to a different pharmacy if your pharmacy of record does not have it in stock. Proper stocking of medication is a pharmacy problem, not a prescriber problem. Work with your pharmacist to solve the problem. 4. Have the [place] Assembly add a provision to the "STOP ACT" (the law that mandates how controlled substances are prescribed) where there is an exception to the electronic prescribing rule that states that in   the event there are shortages of medications the physicians are allowed to use written prescriptions as opposed to electronic ones. This would allow patients to take their prescriptions to a different pharmacy that may have enough  medication available to fill the prescription. The problem is that currently there is a law that does not allow for written prescriptions, with the exception of instances where the electronic medical record is down due to technical issues.  5. Have [place] Congress ease the pressure on pharmaceutical companies, allowing them to produce enough quantities of the medication to adequately supply the population. 6. Have pharmacies keep enough stocks of these medications to cover their client base.  7. Have the [place] State Assembly add a provision to the "STOP ACT" where they ease the regulations surrounding the transfer of controlled substances between pharmacies, so as to simplify the transfer of supplies. As an alternative, develop a system to allow patients to obtain the remainder of their prescription at another one of their pharmacies or at an associate pharmacy.   How this shortage will affect you.  Understand that this is a pharmacy supply problem, not a prescriber problem. Work with your pharmacy to solve it. The job of the prescriber is to evaluate and monitor the patient for the appropriate indications and use of these medicines. It is not the job of the prescriber to supply the medication or to solve problems with that supply. The responsibility and the choice to obtain the medication resides on the patient. By law, supplying the medication is the job of the pharmacy. It is certainly not the job of the prescriber to solve supply problems.   Due to the above problems we are no longer taking patients to write for their pain medication. Future discussions with your physician may include potentially weaning medications or transitioning to alternatives.  We will be focusing primarily on interventional based pain management. We will continue to evaluate for appropriate indications and we may provide recommendations regarding medication, dose, and schedule, as well as monitoring recommendations, however,  we will not be taking over the actual prescribing of these substances. On those patients where we are treating their chronic pain with interventional therapies, exceptions will be considered on a case by case basis. At this time, we will try to continue providing this supplemental service to those patients we have been managing in the past. However, as of August 1st, 2023, we no longer will be sending additional prescriptions to other pharmacies for the purpose of solving their supply problems. Once we send a prescription to a pharmacy, we will not be resending it again to another pharmacy to cover for their shortages.   What to do. Write as many letters as you can. Recruit the help of family members in writing these letters. Below are some of the places where you can write to make your voice heard. Let them know what the problem is and push them to look for solutions.   Search internet for: "[place] find your legislators" https://www.ncleg.gov/findyourlegislators  Search internet for: "[place] insurance commissioner complaints" https://www.ncdoi.gov/contactscomplaints/assistance-or-file-complaint  Search internet for: "[place] Board of Pharmacy complaints" http://www.ncbop.org/contact.htm  Search internet for: "[facility] pharmacy complaints" Email [facility] Customer Relations https://www.[facility].com/help/email-customer-relations.jsp?callType=store  Search internet for: "[facility] pharmacy customer service complaints" https://www.[facility].com/topic/marketing/contactus/contactus_customerservice.jsp  ____________________________________________________________________________________________  ____________________________________________________________________________________________  Drug Holidays (Slow)  What is a "Drug Holiday"? Drug Holiday: is the name given to the period of time during which a patient stops taking a medication(s) for the purpose of eliminating   tolerance  to the drug.  Benefits Improved effectiveness of opioids. Decreased opioid dose needed to achieve benefits. Improved pain with lesser dose.  What is tolerance? Tolerance: is the progressive decreased in effectiveness of a drug due to its repetitive use. With repetitive use, the body gets use to the medication and as a consequence, it loses its effectiveness. This is a common problem seen with opioid pain medications. As a result, a larger dose of the drug is needed to achieve the same effect that used to be obtained with a smaller dose.  How long should a "Drug Holiday" last? You should stay off of the pain medicine for at least 14 consecutive days. (2 weeks)  Should I stop the medicine "cold turkey"? No. You should always coordinate with your Pain Specialist so that he/she can provide you with the correct medication dose to make the transition as smoothly as possible.  How do I stop the medicine? Slowly. You will be instructed to decrease the daily amount of pills that you take by one (1) pill every seven (7) days. This is called a "slow downward taper" of your dose. For example: if you normally take four (4) pills per day, you will be asked to drop this dose to three (3) pills per day for seven (7) days, then to two (2) pills per day for seven (7) days, then to one (1) per day for seven (7) days, and at the end of those last seven (7) days, this is when the "Drug Holiday" would start.   Will I have withdrawals? By doing a "slow downward taper" like this one, it is unlikely that you will experience any significant withdrawal symptoms. Typically, what triggers withdrawals is the sudden stop of a high dose opioid therapy. Withdrawals can usually be avoided by slowly decreasing the dose over a prolonged period of time. If you do not follow these instructions and decide to stop your medication abruptly, withdrawals may be possible.  What are withdrawals? Withdrawals: refers to the wide range of  symptoms that occur after stopping or dramatically reducing opiate drugs after heavy and prolonged use. Withdrawal symptoms do not occur to patients that use low dose opioids, or those who take the medication sporadically. Contrary to benzodiazepine (example: Valium, Xanax, etc.) or alcohol withdrawals ("Delirium Tremens"), opioid withdrawals are not lethal. Withdrawals are the physical manifestation of the body getting rid of the excess receptors.  Expected Symptoms Early symptoms of withdrawal may include: Agitation Anxiety Muscle aches Increased tearing Insomnia Runny nose Sweating Yawning  Late symptoms of withdrawal may include: Abdominal cramping Diarrhea Dilated pupils Goose bumps Nausea Vomiting  Will I experience withdrawals? Due to the slow nature of the taper, it is very unlikely that you will experience any.  What is a slow taper? Taper: refers to the gradual decrease in dose.  (Last update: 09/12/2019) ____________________________________________________________________________________________   ____________________________________________________________________________________________  CBD (cannabidiol) & Delta-8 (Delta-8 tetrahydrocannabinol) WARNING  Intro: Cannabidiol (CBD) and tetrahydrocannabinol (THC), are two natural compounds found in plants of the Cannabis genus. They can both be extracted from hemp or cannabis. Hemp and cannabis come from the Cannabis sativa plant. Both compounds interact with your body's endocannabinoid system, but they have very different effects. CBD does not produce the high sensation associated with cannabis. Delta-8 tetrahydrocannabinol, also known as delta-8 THC, is a psychoactive substance found in the Cannabis sativa plant, of which marijuana and hemp are two varieties. THC is responsible for the high associated with the illicit use of marijuana.    Applicable to: All individuals currently taking or considering taking CBD  (cannabidiol) and, more important, all patients taking opioid analgesic controlled substances (pain medication). (Example: oxycodone; oxymorphone; hydrocodone; hydromorphone; morphine; methadone; tramadol; tapentadol; fentanyl; buprenorphine; butorphanol; dextromethorphan; meperidine; codeine; etc.)  Legal status: CBD remains a Schedule I drug prohibited for any use. CBD is illegal with one exception. In the [place], CBD has a limited Food and Drug Administration (FDA) approval for the treatment of two specific types of epilepsy disorders. Only one CBD product has been approved by the FDA for this purpose: "Epidiolex". FDA is aware that some companies are marketing products containing cannabis and cannabis-derived compounds in ways that violate the Federal Food, Drug and Cosmetic Act (FD&C Act) and that may put the health and safety of consumers at risk. The FDA, a Federal agency, has not enforced the CBD status since 2018. UPDATE: (04/10/2021) The Drug Enforcement Agency (DEA) issued a letter stating that "delta" cannabinoids, including Delta-8-THCO and Delta-9-THCO, synthetically derived from hemp do not qualify as hemp and will be viewed as Schedule I drugs. (Schedule I drugs, substances, or chemicals are defined as drugs with no currently accepted medical use and a high potential for abuse. Some examples of Schedule I drugs are: heroin, lysergic acid diethylamide (LSD), marijuana (cannabis), 3,4-methylenedioxymethamphetamine (ecstasy), methaqualone, and peyote.) (https://www.dea.gov)  Legality: Some manufacturers ship CBD products nationally, which is illegal. Often such products are sold online and are therefore available throughout the country. CBD is openly sold in head shops and health food stores in some states where such sales have not been explicitly legalized. Selling unapproved products with unsubstantiated therapeutic claims is not only a violation of the law, but also can put patients at  risk, as these products have not been proven to be safe or effective. Federal illegality makes it difficult to conduct research on CBD.  Reference: "FDA Regulation of Cannabis and Cannabis-Derived Products, Including Cannabidiol (CBD)" - https://www.fda.gov/news-events/public-health-focus/fda-regulation-cannabis-and-cannabis-derived-products-including-cannabidiol-cbd  Warning: CBD is not FDA approved and has not undergo the same manufacturing controls as prescription drugs.  This means that the purity and safety of available CBD may be questionable. Most of the time, despite manufacturer's claims, it is contaminated with THC (delta-9-tetrahydrocannabinol - the chemical in marijuana responsible for the "HIGH").  When this is the case, the THC contaminant will trigger a positive urine drug screen (UDS) test for Marijuana (carboxy-THC). Because a positive UDS for any illicit substance is a violation of our medication agreement, your opioid analgesics (pain medicine) may be permanently discontinued. The FDA recently put out a warning about 5 things that everyone should be aware of regarding Delta-8 THC: Delta-8 THC products have not been evaluated or approved by the FDA for safe use and may be marketed in ways that put the public health at risk. The FDA has received adverse event reports involving delta-8 THC-containing products. Delta-8 THC has psychoactive and intoxicating effects. Delta-8 THC manufacturing often involve use of potentially harmful chemicals to create the concentrations of delta-8 THC claimed in the marketplace. The final delta-8 THC product may have potentially harmful by-products (contaminants) due to the chemicals used in the process. Manufacturing of delta-8 THC products may occur in uncontrolled or unsanitary settings, which may lead to the presence of unsafe contaminants or other potentially harmful substances. Delta-8 THC products should be kept out of the reach of children and  pets.  MORE ABOUT CBD  General Information: CBD was discovered in 1940 and it is a derivative of the cannabis sativa genus plants (Marijuana   and Hemp). It is one of the 113 identified substances found in Marijuana. It accounts for up to 40% of the plant's extract. As of 2018, preliminary clinical studies on CBD included research for the treatment of anxiety, movement disorders, and pain. CBD is available and consumed in multiple forms, including inhalation of smoke or vapor, as an aerosol spray, and by mouth. It may be supplied as an oil containing CBD, capsules, dried cannabis, or as a liquid solution. CBD is thought not to be as psychoactive as THC (delta-9-tetrahydrocannabinol - the chemical in marijuana responsible for the "HIGH"). Studies suggest that CBD may interact with different biological target receptors in the body, including cannabinoid and other neurotransmitter receptors. As of 2018 the mechanism of action for its biological effects has not been determined.  Side-effects  Adverse reactions: Dry mouth, diarrhea, decreased appetite, fatigue, drowsiness, malaise, weakness, sleep disturbances, and others.  Drug interactions: CBC may interact with other medications such as blood-thinners. Because CBD causes drowsiness on its own, it also increases the drowsiness caused by other medications, including antihistamines (such as Benadryl), benzodiazepines (Xanax, Ativan, Valium), antipsychotics, antidepressants and opioids, as well as alcohol and supplements such as kava, melatonin and St. John's Wort. Be cautious with the following combinations:   Brivaracetam (Briviact) Brivaracetam is changed and broken down by the body. CBD might decrease how quickly the body breaks down brivaracetam. This might increase levels of brivaracetam in the body.  Caffeine Caffeine is changed and broken down by the body. CBD might decrease how quickly the body breaks down caffeine. This might increase levels of  caffeine in the body.  Carbamazepine (Tegretol) Carbamazepine is changed and broken down by the body. CBD might decrease how quickly the body breaks down carbamazepine. This might increase levels of carbamazepine in the body and increase its side effects.  Citalopram (Celexa) Citalopram is changed and broken down by the body. CBD might decrease how quickly the body breaks down citalopram. This might increase levels of citalopram in the body and increase its side effects.  Clobazam (Onfi) Clobazam is changed and broken down by the liver. CBD might decrease how quickly the liver breaks down clobazam. This might increase the effects and side effects of clobazam.  Eslicarbazepine (Aptiom) Eslicarbazepine is changed and broken down by the body. CBD might decrease how quickly the body breaks down eslicarbazepine. This might increase levels of eslicarbazepine in the body by a small amount.  Everolimus (Zostress) Everolimus is changed and broken down by the body. CBD might decrease how quickly the body breaks down everolimus. This might increase levels of everolimus in the body.  Lithium Taking higher doses of CBD might increase levels of lithium. This can increase the risk of lithium toxicity.  Medications changed by the liver (Cytochrome P450 1A1 (CYP1A1) substrates) Some medications are changed and broken down by the liver. CBD might change how quickly the liver breaks down these medications. This could change the effects and side effects of these medications.  Medications changed by the liver (Cytochrome P450 1A2 (CYP1A2) substrates) Some medications are changed and broken down by the liver. CBD might change how quickly the liver breaks down these medications. This could change the effects and side effects of these medications.  Medications changed by the liver (Cytochrome P450 1B1 (CYP1B1) substrates) Some medications are changed and broken down by the liver. CBD might change how quickly the  liver breaks down these medications. This could change the effects and side effects of these medications.  Medications changed   by the liver (Cytochrome P450 2A6 (CYP2A6) substrates) Some medications are changed and broken down by the liver. CBD might change how quickly the liver breaks down these medications. This could change the effects and side effects of these medications.  Medications changed by the liver (Cytochrome P450 2B6 (CYP2B6) substrates) Some medications are changed and broken down by the liver. CBD might change how quickly the liver breaks down these medications. This could change the effects and side effects of these medications.  Medications changed by the liver (Cytochrome P450 2C19 (CYP2C19) substrates) Some medications are changed and broken down by the liver. CBD might change how quickly the liver breaks down these medications. This could change the effects and side effects of these medications.  Medications changed by the liver (Cytochrome P450 2C8 (CYP2C8) substrates) Some medications are changed and broken down by the liver. CBD might change how quickly the liver breaks down these medications. This could change the effects and side effects of these medications.  Medications changed by the liver (Cytochrome P450 2C9 (CYP2C9) substrates) Some medications are changed and broken down by the liver. CBD might change how quickly the liver breaks down these medications. This could change the effects and side effects of these medications.  Medications changed by the liver (Cytochrome P450 2D6 (CYP2D6) substrates) Some medications are changed and broken down by the liver. CBD might change how quickly the liver breaks down these medications. This could change the effects and side effects of these medications.  Medications changed by the liver (Cytochrome P450 2E1 (CYP2E1) substrates) Some medications are changed and broken down by the liver. CBD might change how quickly the liver  breaks down these medications. This could change the effects and side effects of these medications.  Medications changed by the liver (Cytochrome P450 3A4 (CYP3A4) substrates) Some medications are changed and broken down by the liver. CBD might change how quickly the liver breaks down these medications. This could change the effects and side effects of these medications.  Medications changed by the liver (Glucuronidated drugs) Some medications are changed and broken down by the liver. CBD might change how quickly the liver breaks down these medications. This could change the effects and side effects of these medications.  Medications that decrease the breakdown of other medications by the liver (Cytochrome P450 2C19 (CYP2C19) inhibitors) CBD is changed and broken down by the liver. Some drugs decrease how quickly the liver changes and breaks down CBD. This could change the effects and side effects of CBD.  Medications that decrease the breakdown of other medications in the liver (Cytochrome P450 3A4 (CYP3A4) inhibitors) CBD is changed and broken down by the liver. Some drugs decrease how quickly the liver changes and breaks down CBD. This could change the effects and side effects of CBD.  Medications that increase breakdown of other medications by the liver (Cytochrome P450 3A4 (CYP3A4) inducers) CBD is changed and broken down by the liver. Some drugs increase how quickly the liver changes and breaks down CBD. This could change the effects and side effects of CBD.  Medications that increase the breakdown of other medications by the liver (Cytochrome P450 2C19 (CYP2C19) inducers) CBD is changed and broken down by the liver. Some drugs increase how quickly the liver changes and breaks down CBD. This could change the effects and side effects of CBD.  Methadone (Dolophine) Methadone is broken down by the liver. CBD might decrease how quickly the liver breaks down methadone. Taking cannabidiol along  with methadone might   increase the effects and side effects of methadone.  Rufinamide (Banzel) Rufinamide is changed and broken down by the body. CBD might decrease how quickly the body breaks down rufinamide. This might increase levels of rufinamide in the body by a small amount.  Sedative medications (CNS depressants) CBD might cause sleepiness and slowed breathing. Some medications, called sedatives, can also cause sleepiness and slowed breathing. Taking CBD with sedative medications might cause breathing problems and/or too much sleepiness.  Sirolimus (Rapamune) Sirolimus is changed and broken down by the body. CBD might decrease how quickly the body breaks down sirolimus. This might increase levels of sirolimus in the body.  Stiripentol (Diacomit) Stiripentol is changed and broken down by the body. CBD might decrease how quickly the body breaks down stiripentol. This might increase levels of stiripentol in the body and increase its side effects.  Tacrolimus (Prograf) Tacrolimus is changed and broken down by the body. CBD might decrease how quickly the body breaks down tacrolimus. This might increase levels of tacrolimus in the body.  Tamoxifen (Soltamox) Tamoxifen is changed and broken down by the body. CBD might affect how quickly the body breaks down tamoxifen. This might affect levels of tamoxifen in the body.  Topiramate (Topamax) Topiramate is changed and broken down by the body. CBD might decrease how quickly the body breaks down topiramate. This might increase levels of topiramate in the body by a small amount.  Valproate Valproic acid can cause liver injury. Taking cannabidiol with valproic acid might increase the chance of liver injury. CBD and/or valproic acid might need to be stopped, or the dose might need to be reduced.  Warfarin (Coumadin) CBD might increase levels of warfarin, which can increase the risk for bleeding. CBD and/or warfarin might need to be stopped, or the  dose might need to be reduced.  Zonisamide Zonisamide is changed and broken down by the body. CBD might decrease how quickly the body breaks down zonisamide. This might increase levels of zonisamide in the body by a small amount. (Last update: 04/22/2021) ____________________________________________________________________________________________   

## 2021-12-09 ENCOUNTER — Ambulatory Visit: Payer: Managed Care, Other (non HMO) | Attending: Pain Medicine | Admitting: Pain Medicine

## 2021-12-09 ENCOUNTER — Encounter: Payer: Self-pay | Admitting: Pain Medicine

## 2021-12-09 VITALS — BP 150/108 | HR 99 | Temp 96.8°F | Resp 16 | Ht 63.0 in | Wt 233.0 lb

## 2021-12-09 DIAGNOSIS — M5412 Radiculopathy, cervical region: Secondary | ICD-10-CM | POA: Diagnosis present

## 2021-12-09 DIAGNOSIS — Z79891 Long term (current) use of opiate analgesic: Secondary | ICD-10-CM | POA: Diagnosis present

## 2021-12-09 DIAGNOSIS — M7918 Myalgia, other site: Secondary | ICD-10-CM | POA: Diagnosis present

## 2021-12-09 DIAGNOSIS — M47816 Spondylosis without myelopathy or radiculopathy, lumbar region: Secondary | ICD-10-CM | POA: Insufficient documentation

## 2021-12-09 DIAGNOSIS — M542 Cervicalgia: Secondary | ICD-10-CM | POA: Diagnosis present

## 2021-12-09 DIAGNOSIS — M79605 Pain in left leg: Secondary | ICD-10-CM | POA: Insufficient documentation

## 2021-12-09 DIAGNOSIS — M545 Low back pain, unspecified: Secondary | ICD-10-CM | POA: Diagnosis present

## 2021-12-09 DIAGNOSIS — Z79899 Other long term (current) drug therapy: Secondary | ICD-10-CM | POA: Diagnosis present

## 2021-12-09 DIAGNOSIS — M47812 Spondylosis without myelopathy or radiculopathy, cervical region: Secondary | ICD-10-CM | POA: Diagnosis present

## 2021-12-09 DIAGNOSIS — G894 Chronic pain syndrome: Secondary | ICD-10-CM | POA: Insufficient documentation

## 2021-12-09 DIAGNOSIS — G8929 Other chronic pain: Secondary | ICD-10-CM | POA: Insufficient documentation

## 2021-12-09 MED ORDER — HYDROCODONE-ACETAMINOPHEN 5-325 MG PO TABS: 0.5000 | ORAL_TABLET | Freq: Two times a day (BID) | ORAL | 0 refills | Status: DC | PRN

## 2021-12-09 MED ORDER — NALOXONE HCL 4 MG/0.1ML NA LIQD: 1.0000 | NASAL | 0 refills | Status: DC | PRN

## 2021-12-09 NOTE — Progress Notes (Signed)
Nursing Pain Medication Assessment:  Safety precautions to be maintained throughout the outpatient stay will include: orient to surroundings, keep bed in low position, maintain call bell within reach at all times, provide assistance with transfer out of bed and ambulation.  Medication Inspection Compliance: Pill count conducted under aseptic conditions, in front of the patient. Neither the pills nor the bottle was removed from the patient's sight at any time. Once count was completed pills were immediately returned to the patient in their original bottle.  Medication: Hydrocodone/APAP Pill/Patch Count:  0. of 60 pills remain Pill/Patch Appearance: Markings consistent with prescribed medication Bottle Appearance: Standard pharmacy container. Clearly labeled. Filled Date: 09 / 9 / 2023 Last Medication intake:  Today

## 2021-12-10 NOTE — Telephone Encounter (Signed)
I am willing to try.  Please have her use the Cytotec the night before and the morning of the attempted US guided removal.

## 2021-12-10 NOTE — Telephone Encounter (Signed)
Left message to call Genie Mirabal, RN at [facility], [phone], OPT 5.  

## 2021-12-10 NOTE — Telephone Encounter (Signed)
Spoke with patient.  Patient is asking if Dr. Quincy Simmonds will would be willing to attempt removal of IUD in office again under ultrasound guidance due to out of pocket cost of IUD removal in outpatient setting. Advised I will send to Dr. Quincy Simmonds to review and I will f/u with recommendations. Patient agreeable.   Dr. Quincy Simmonds -please review and advise.

## 2021-12-11 NOTE — Telephone Encounter (Signed)
Left message to call Jakye Mullens, RN at [facility], [phone].  

## 2021-12-14 ENCOUNTER — Encounter: Payer: Managed Care, Other (non HMO) | Admitting: Pain Medicine

## 2021-12-14 MED ORDER — MISOPROSTOL 200 MCG PO TABS: ORAL_TABLET | ORAL | 0 refills | Status: DC

## 2021-12-14 NOTE — Telephone Encounter (Signed)
Left message to call Mamie Hundertmark, RN at [facility], [phone].  

## 2021-12-14 NOTE — Telephone Encounter (Signed)
Spoke with patient, advised per Dr. Quincy Simmonds.  Patient request to cancel surgery scheduled for 12/21/21 and proceed with US guided IUD removal.   PUS guided IUD removal scheduled for 01/07/22 at 0800. Orders placed, business office notified.   Cytotec 200 mcg to verified pharmacy.  Pace 1 tablet vaginal the night before procedure and Place 1 tablet vaginal the morning of the procedure.  Motrin 800 mg with food and water one hour before procedure. Eat and hydrate well. Patient verbalizes understanding and is agreeable.   Call placed to central scheduling, spoke with Colletta [place], case cancelled.   Routing to provider for final review. Patient is agreeable to disposition. Will close encounter.   Cc: [employer], [employer]

## 2021-12-21 ENCOUNTER — Encounter (HOSPITAL_BASED_OUTPATIENT_CLINIC_OR_DEPARTMENT_OTHER): Admission: RE | Payer: Self-pay | Source: Ambulatory Visit

## 2021-12-21 ENCOUNTER — Ambulatory Visit (HOSPITAL_BASED_OUTPATIENT_CLINIC_OR_DEPARTMENT_OTHER)
Admission: RE | Admit: 2021-12-21 | Payer: Managed Care, Other (non HMO) | Source: Ambulatory Visit | Admitting: Obstetrics and Gynecology

## 2021-12-21 SURGERY — HYSTEROSCOPY
Anesthesia: Choice

## 2021-12-23 ENCOUNTER — Other Ambulatory Visit: Payer: Self-pay | Admitting: Primary Care

## 2021-12-23 DIAGNOSIS — M7918 Myalgia, other site: Secondary | ICD-10-CM

## 2021-12-23 DIAGNOSIS — M62838 Other muscle spasm: Secondary | ICD-10-CM

## 2021-12-23 NOTE — Telephone Encounter (Signed)
Patient is due for CPE/follow up, this will be required prior to any further refills. Due for CPE/follow up in early December. Please schedule.   Let me know once she is scheduled. Thanks!

## 2021-12-23 NOTE — Telephone Encounter (Signed)
LVM for patient to call back and schedule

## 2021-12-24 NOTE — Telephone Encounter (Signed)
Left voicemail to call back and schedule appointment for further refills

## 2021-12-24 NOTE — Telephone Encounter (Signed)
LVM to call back.

## 2021-12-28 NOTE — Telephone Encounter (Signed)
Patient has been scheduled

## 2021-12-29 ENCOUNTER — Other Ambulatory Visit: Payer: Self-pay | Admitting: Primary Care

## 2021-12-29 ENCOUNTER — Encounter: Payer: Self-pay | Admitting: Primary Care

## 2021-12-29 ENCOUNTER — Ambulatory Visit (INDEPENDENT_AMBULATORY_CARE_PROVIDER_SITE_OTHER): Payer: Managed Care, Other (non HMO) | Admitting: Primary Care

## 2021-12-29 VITALS — BP 128/82 | HR 90 | Temp 96.6°F | Ht 63.0 in | Wt 231.0 lb

## 2021-12-29 DIAGNOSIS — R7303 Prediabetes: Secondary | ICD-10-CM | POA: Diagnosis not present

## 2021-12-29 DIAGNOSIS — M7918 Myalgia, other site: Secondary | ICD-10-CM

## 2021-12-29 DIAGNOSIS — J4520 Mild intermittent asthma, uncomplicated: Secondary | ICD-10-CM | POA: Diagnosis not present

## 2021-12-29 DIAGNOSIS — E781 Pure hyperglyceridemia: Secondary | ICD-10-CM

## 2021-12-29 DIAGNOSIS — J309 Allergic rhinitis, unspecified: Secondary | ICD-10-CM

## 2021-12-29 DIAGNOSIS — L680 Hirsutism: Secondary | ICD-10-CM

## 2021-12-29 DIAGNOSIS — E875 Hyperkalemia: Secondary | ICD-10-CM

## 2021-12-29 DIAGNOSIS — M5136 Other intervertebral disc degeneration, lumbar region: Secondary | ICD-10-CM

## 2021-12-29 DIAGNOSIS — Z0000 Encounter for general adult medical examination without abnormal findings: Secondary | ICD-10-CM | POA: Diagnosis not present

## 2021-12-29 DIAGNOSIS — Z9884 Bariatric surgery status: Secondary | ICD-10-CM | POA: Diagnosis not present

## 2021-12-29 DIAGNOSIS — F5081 Binge eating disorder: Secondary | ICD-10-CM

## 2021-12-29 DIAGNOSIS — F411 Generalized anxiety disorder: Secondary | ICD-10-CM

## 2021-12-29 DIAGNOSIS — I10 Essential (primary) hypertension: Secondary | ICD-10-CM

## 2021-12-29 DIAGNOSIS — M5030 Other cervical disc degeneration, unspecified cervical region: Secondary | ICD-10-CM

## 2021-12-29 DIAGNOSIS — E282 Polycystic ovarian syndrome: Secondary | ICD-10-CM

## 2021-12-29 DIAGNOSIS — K219 Gastro-esophageal reflux disease without esophagitis: Secondary | ICD-10-CM

## 2021-12-29 DIAGNOSIS — G894 Chronic pain syndrome: Secondary | ICD-10-CM

## 2021-12-29 LAB — CBC
HCT: 40 % (ref 36.0–46.0)
Hemoglobin: 13.4 g/dL (ref 12.0–15.0)
MCHC: 33.4 g/dL (ref 30.0–36.0)
MCV: 89 fl (ref 78.0–100.0)
Platelets: 217 10*3/uL (ref 150.0–400.0)
RBC: 4.5 Mil/uL (ref 3.87–5.11)
RDW: 12.9 % (ref 11.5–15.5)
WBC: 7.7 10*3/uL (ref 4.0–10.5)

## 2021-12-29 LAB — COMPREHENSIVE METABOLIC PANEL
ALT: 18 U/L (ref 0–35)
AST: 18 U/L (ref 0–37)
Albumin: 4.4 g/dL (ref 3.5–5.2)
Alkaline Phosphatase: 110 U/L (ref 39–117)
BUN: 11 mg/dL (ref 6–23)
CO2: 31 mEq/L (ref 19–32)
Calcium: 9.3 mg/dL (ref 8.4–10.5)
Chloride: 101 mEq/L (ref 96–112)
Creatinine, Ser: 0.92 mg/dL (ref 0.40–1.20)
GFR: 70.13 mL/min (ref >60.00)
Glucose, Bld: 90 mg/dL (ref 70–99)
Potassium: 5.4 mEq/L — ABNORMAL HIGH (ref 3.5–5.1)
Sodium: 137 mEq/L (ref 135–145)
Total Bilirubin: 0.6 mg/dL (ref 0.2–1.2)
Total Protein: 6.8 g/dL (ref 6.0–8.3)

## 2021-12-29 LAB — LIPID PANEL
Cholesterol: 145 mg/dL (ref 0–200)
HDL: 57.8 mg/dL (ref >39.00)
LDL Cholesterol: 69 mg/dL (ref 0–99)
NonHDL: 87.18
Total CHOL/HDL Ratio: 3
Triglycerides: 91 mg/dL (ref 0.0–149.0)
VLDL: 18.2 mg/dL (ref 0.0–40.0)

## 2021-12-29 LAB — HEMOGLOBIN A1C: Hgb A1c MFr Bld: 6.1 % (ref 4.6–6.5)

## 2021-12-29 LAB — VITAMIN D 25 HYDROXY (VIT D DEFICIENCY, FRACTURES): VITD: 27.38 ng/mL — ABNORMAL LOW (ref 30.00–100.00)

## 2021-12-29 LAB — VITAMIN B12: Vitamin B-12: 312 pg/mL (ref 211–911)

## 2021-12-29 NOTE — Assessment & Plan Note (Signed)
Follows with GYN. Continue spironolactone 25 mg daily.  CMP pending

## 2021-12-29 NOTE — Assessment & Plan Note (Signed)
Due for tetanus, second Shingrix, and influenza. She declines all. Pap smear UTD. Follows with GYN. Colonoscopy due, she has appointment with GYN.   Discussed the importance of a healthy diet and regular exercise in order for weight loss, and to reduce the risk of further co-morbidity.  Exam stable. Labs pending.  Follow up in 1 year for repeat physical.

## 2021-12-29 NOTE — Assessment & Plan Note (Signed)
Controlled.  Following with psychiatry.  Continue bupropion XL 300 mg daily, Lamictal 300 mg daily, Paxil 37.5 mg BID, lorazepam 1 mg PRN.

## 2021-12-29 NOTE — Assessment & Plan Note (Signed)
Repeat A1C pending.  Continue metformin XR 500 mg daily. 

## 2021-12-29 NOTE — Progress Notes (Signed)
Subjective:    Patient ID: Debra Myers, female    DOB: 1965-07-13, 56 y.o.   MRN: [id number]  HPI  Debra Myers is a very pleasant 56 y.o. female who presents today for complete physical and follow up of chronic conditions.  Immunizations: -Tetanus: 2008, declines  -Influenza: Declines  -Shingles: Completed 1 dose of Shingrix, declines today  Diet: Fair diet.  Exercise: No regular exercise.  Eye exam: Completes annually  Dental exam: Completes semi-annually   Pap Smear: Completed in 2020, follows with GYN.  Mammogram: Completed in May 2023  Colonoscopy: Never completed, due and is scheduled.   BP Readings from Last 3 Encounters:  12/29/21 128/82  12/09/21 (!) 150/108  11/30/21 136/82        Review of Systems  Constitutional:  Negative for unexpected weight change.  HENT:  Negative for rhinorrhea.   Eyes:  Negative for visual disturbance.  Respiratory:  Negative for cough and shortness of breath.   Cardiovascular:  Negative for chest pain.  Gastrointestinal:  Negative for constipation and diarrhea.  Genitourinary:  Negative for difficulty urinating.  Musculoskeletal:  Positive for arthralgias, back pain and myalgias.  Skin:  Negative for rash.  Allergic/Immunologic: Positive for environmental allergies.  Neurological:  Negative for dizziness and headaches.  Psychiatric/Behavioral:  The patient is not nervous/anxious.          Past Medical History:  Diagnosis Date   Anxiety    Asthma    ALLERGY INDUCED   Bulging lumbar disc (L3-4) 02/26/2015   Degenerative disc disease    Degenerative lumbar disc    Depression    Diabetes mellitus    TYPE 2   Elective abortion    ONE   GERD (gastroesophageal reflux disease)    takes prilosec    Hypertension    Stroke (HCC) 06/11/2015   patient describes as mini stroke   Vaginal delivery    ONE NSVD    Social History   Socioeconomic History   Marital status: Married    Spouse name: Not on file   Number of  children: Not on file   Years of education: Not on file   Highest education level: Not on file  Occupational History   Not on file  Tobacco Use   Smoking status: Never   Smokeless tobacco: Never  Vaping Use   Vaping Use: Never used  Substance and Sexual Activity   Alcohol use: Never    Alcohol/week: 0.0 standard drinks of alcohol   Drug use: No   Sexual activity: Yes    Birth control/protection: I.U.D.    Comment: 1st intercourse 55 yo-Fewer than 5 partners-Mirena  inserted 09-2014  Other Topics Concern   Not on file  Social History Narrative   Not on file   Social Determinants of Health   Financial Resource Strain: Not on file  Food Insecurity: Not on file  Transportation Needs: Not on file  Physical Activity: Not on file  Stress: Not on file  Social Connections: Not on file  Intimate Partner Violence: Not on file    Past Surgical History:  Procedure Laterality Date   CHOLECYSTECTOMY  2001   DILATATION & CURETTAGE/HYSTEROSCOPY WITH MYOSURE N/A 09/10/2014   Procedure: DILATATION & CURETTAGE/HYSTEROSCOPY WITH MYOSURE/INSERTION OF IUD;  Surgeon: Ok Edwards, MD;  Location: [facility];  Service: Gynecology;  Laterality: N/A;   lap band surgery     Lapband  04/2012   mirena     inserted 09-2014   WISDOM  TOOTH EXTRACTION      Family History  Problem Relation Age of Onset   Cancer Mother        thyroid cancer   Hypertension Mother    Hypertension Father    Diabetes Father    Asthma Maternal Grandmother    Stroke Maternal Grandmother    Stroke Maternal Grandfather     Allergies  Allergen Reactions   Oxycodone Itching   Penicillins Other (See Comments)    Reaction:  GI upset  Has patient had a PCN reaction causing immediate rash, facial/tongue/throat swelling, SOB or lightheadedness with hypotension: No Has patient had a PCN reaction causing severe rash involving mucus membranes or skin necrosis: No Has patient had a PCN reaction that required hospitalization  No Has patient had a PCN reaction occurring within the last 10 years: No If all of the above answers are "NO", then may proceed with Cephalosporin use.   Red Dye     Numb tongue   Sulfa Antibiotics Itching and Rash    Current Outpatient Medications on File Prior to Visit  Medication Sig Dispense Refill   albuterol (VENTOLIN HFA) 108 (90 Base) MCG/ACT inhaler Inhale 2 puffs into the lungs every 6 (six) hours as needed for wheezing or shortness of breath. 18 g 0   atorvastatin (LIPITOR) 10 MG tablet TAKE 1 TABLET(10 MG) BY MOUTH DAILY for cholesterol. 90 tablet 3   buPROPion (WELLBUTRIN XL) 300 MG 24 hr tablet Take 300 mg by mouth daily.     cetirizine (ZYRTEC) 10 MG tablet Take 10 mg by mouth at bedtime.     fluticasone (FLONASE) 50 MCG/ACT nasal spray SHAKE LIQUID AND USE 1 SPRAY IN EACH NOSTRIL TWICE DAILY AS NEEDED FOR ALLERGIES OR RHINITIS 16 g 3   HYDROcodone-acetaminophen (NORCO/VICODIN) 5-325 MG tablet Take 0.5-1 tablets by mouth 2 (two) times daily as needed for severe pain. Must last 30 days 60 tablet 0   [START ON 01/07/2022] HYDROcodone-acetaminophen (NORCO/VICODIN) 5-325 MG tablet Take 0.5-1 tablets by mouth 2 (two) times daily as needed for severe pain. Must last 30 days 60 tablet 0   [START ON 02/06/2022] HYDROcodone-acetaminophen (NORCO/VICODIN) 5-325 MG tablet Take 0.5-1 tablets by mouth 2 (two) times daily as needed for severe pain. Must last 30 days 60 tablet 0   ibuprofen (ADVIL) 200 MG tablet Take 400 mg by mouth every 8 (eight) hours as needed.     lamoTRIgine (LAMICTAL) 200 MG tablet Take 300 mg by mouth at bedtime.      LORazepam (ATIVAN) 1 MG tablet Take 0.5-1 mg by mouth every 8 (eight) hours as needed for anxiety or sleep.      metaxalone (SKELAXIN) 800 MG tablet TAKE 1 TABLET(800 MG) BY MOUTH THREE TIMES DAILY AS NEEDED FOR MUSCLE SPASMS 90 tablet 0   metFORMIN (GLUCOPHAGE XR) 500 MG 24 hr tablet Take 1 tablet (500 mg total) by mouth daily with breakfast. 90 tablet 3    misoprostol (CYTOTEC) 200 MCG tablet Pace 1 tablet vaginal the night before procedure and place 1 tablet vaginal the morning of the procedure. 2 tablet 0   montelukast (SINGULAIR) 10 MG tablet Take 1 tablet (10 mg total) by mouth at bedtime. For allergies. 90 tablet 3   naloxone (NARCAN) nasal spray 4 mg/0.1 mL Place 1 spray into the nose as needed for up to 365 doses (for opioid-induced respiratory depresssion). In case of emergency (overdose), spray once into each nostril. If no response within 3 minutes, repeat application and call 911.  1 each 0   PARoxetine (PAXIL-CR) 37.5 MG 24 hr tablet Take 2 tablets by mouth daily.     RA ASPIRIN EC ADULT LOW ST 81 MG EC tablet take 1 tablet by mouth once daily 30 tablet 0   spironolactone (ALDACTONE) 25 MG tablet TAKE 1 TABLET(25 MG) BY MOUTH DAILY 90 tablet 3   VYVANSE 40 MG capsule Take 40 mg by mouth daily.  0   No current facility-administered medications on file prior to visit.    BP 128/82   Pulse 90   Temp (!) 96.6 F (35.9 C) (Temporal)   Ht 5\' 3"  (1.6 m)   Wt 231 lb (104.8 kg)   SpO2 99%   BMI 40.92 kg/m  Objective:   Physical Exam HENT:     Right Ear: Tympanic membrane and ear canal normal.     Left Ear: Tympanic membrane and ear canal normal.     Nose: Nose normal.  Eyes:     Conjunctiva/sclera: Conjunctivae normal.     Pupils: Pupils are equal, round, and reactive to light.  Neck:     Thyroid: No thyromegaly.  Cardiovascular:     Rate and Rhythm: Normal rate and regular rhythm.     Heart sounds: No murmur heard. Pulmonary:     Effort: Pulmonary effort is normal.     Breath sounds: Normal breath sounds. No rales.  Abdominal:     General: Bowel sounds are normal.     Palpations: Abdomen is soft.     Tenderness: There is no abdominal tenderness.  Musculoskeletal:        General: Normal range of motion.     Cervical back: Neck supple.  Lymphadenopathy:     Cervical: No cervical adenopathy.  Skin:    General: Skin is  warm and dry.     Findings: No rash.  Neurological:     Mental Status: She is alert and oriented to person, place, and time.     Cranial Nerves: No cranial nerve deficit.     Deep Tendon Reflexes: Reflexes are normal and symmetric.  Psychiatric:        Mood and Affect: Mood normal.           Assessment & Plan:   Problem List Items Addressed This Visit       Cardiovascular and Mediastinum   Hypertension    Controlled.  Continue spironolactone 25 mg daily for which she uses for PCOS. CMP pending.      Relevant Orders   Comprehensive metabolic panel   CBC     Respiratory   Allergic rhinitis    Intermittent, seasonal.  Continue Flonase PRN, Singulair 10 mg daily.       Asthma    Controlled.  Continue albuterol inhaler PRN.  She uses sparingly.         Digestive   GERD (gastroesophageal reflux disease)    Controlled, symptoms are intermittent.   Continue omeprazole 20 mg PRN.          Endocrine   Polycystic ovaries    Continue spironolactone 25 mg daily and metformin XR 500 mg daily.  CMP pending.         Musculoskeletal and Integument   DDD (degenerative disc disease), lumbar (Chronic)    Controlled. Following with pain management.  Continue hydrocodone-acetaminophen 5-325 mg BID PRN. Continue Skelaxin 800 mg for which she takes BID.       DDD (degenerative disc disease), cervical (Chronic)    Controlled. Following  with pain management.  Continue hydrocodone-acetaminophen 5-325 mg BID PRN. Continue Skelaxin 800 mg BID.       Hirsutism    Follows with GYN. Continue spironolactone 25 mg daily.  CMP pending        Other   Musculoskeletal pain (Chronic)    Controlled.  Continue Skelaxin 800 mg for which she takes BID mostly.       Chronic pain syndrome (Chronic)    Controlled. Is trying to wean down.  Continue hydrocodone-acetaminophen 5-325 mg BID PRN. Continue Skelaxin 800 mg BID.   Following with pain management.         Prediabetes    Repeat A1C pending.  Continue metformin XR 500 mg daily.      Relevant Orders   Hemoglobin A1c   HYPERTRIGLYCERIDEMIA    Continue atorvastatin 10 mg daily. Repeat lipid panel pending.      Relevant Orders   Lipid panel   Generalized anxiety disorder    Controlled.  Following with psychiatry.  Continue bupropion XL 300 mg daily, Lamictal 300 mg daily, Paxil 37.5 mg BID, lorazepam 1 mg PRN.      Binge eating disorder    Following with psychiatry.  Continue Vyvanse 40 mg daily.       History of bariatric surgery   Relevant Orders   Vitamin B12   VITAMIN D 25 Hydroxy (Vit-D Deficiency, Fractures)   Preventative health care - Primary    Due for tetanus, second Shingrix, and influenza. She declines all. Pap smear UTD. Follows with GYN. Colonoscopy due, she has appointment with GYN.   Discussed the importance of a healthy diet and regular exercise in order for weight loss, and to reduce the risk of further co-morbidity.  Exam stable. Labs pending.  Follow up in 1 year for repeat physical.           Pleas Koch, NP

## 2021-12-29 NOTE — Assessment & Plan Note (Addendum)
Controlled. Following with pain management.  Continue hydrocodone-acetaminophen 5-325 mg BID PRN. Continue Skelaxin 800 mg for which she takes BID.

## 2021-12-29 NOTE — Assessment & Plan Note (Addendum)
Controlled.  Continue spironolactone 25 mg daily for which she uses for PCOS. CMP pending.

## 2021-12-29 NOTE — Assessment & Plan Note (Signed)
Controlled.  Continue albuterol inhaler PRN. She uses sparingly. 

## 2021-12-29 NOTE — Patient Instructions (Signed)
Stop by the lab prior to leaving today. I will notify you of your results once received.   Consider the Shingles, tetanus, and flu shots.   Complete your colonoscopy.  It was a pleasure to see you today!  Preventive Care 5-55 Years Old, Female Preventive care refers to lifestyle choices and visits with your health care provider that can promote health and wellness. Preventive care visits are also called wellness exams. What can I expect for my preventive care visit? Counseling Your health care provider may ask you questions about your: Medical history, including: Past medical problems. Family medical history. Pregnancy history. Current health, including: Menstrual cycle. Method of birth control. Emotional well-being. Home life and relationship well-being. Sexual activity and sexual health. Lifestyle, including: Alcohol, nicotine or tobacco, and drug use. Access to firearms. Diet, exercise, and sleep habits. Work and work Statistician. Sunscreen use. Safety issues such as seatbelt and bike helmet use. Physical exam Your health care provider will check your: Height and weight. These may be used to calculate your BMI (body mass index). BMI is a measurement that tells if you are at a healthy weight. Waist circumference. This measures the distance around your waistline. This measurement also tells if you are at a healthy weight and may help predict your risk of certain diseases, such as type 2 diabetes and high blood pressure. Heart rate and blood pressure. Body temperature. Skin for abnormal spots. What immunizations do I need?  Vaccines are usually given at various ages, according to a schedule. Your health care provider will recommend vaccines for you based on your age, medical history, and lifestyle or other factors, such as travel or where you work. What tests do I need? Screening Your health care provider may recommend screening tests for certain conditions. This may  include: Lipid and cholesterol levels. Diabetes screening. This is done by checking your blood sugar (glucose) after you have not eaten for a while (fasting). Pelvic exam and Pap test. Hepatitis B test. Hepatitis C test. HIV (human immunodeficiency virus) test. STI (sexually transmitted infection) testing, if you are at risk. Lung cancer screening. Colorectal cancer screening. Mammogram. Talk with your health care provider about when you should start having regular mammograms. This may depend on whether you have a family history of breast cancer. BRCA-related cancer screening. This may be done if you have a family history of breast, ovarian, tubal, or peritoneal cancers. Bone density scan. This is done to screen for osteoporosis. Talk with your health care provider about your test results, treatment options, and if necessary, the need for more tests. Follow these instructions at home: Eating and drinking  Eat a diet that includes fresh fruits and vegetables, whole grains, lean protein, and low-fat dairy products. Take vitamin and mineral supplements as recommended by your health care provider. Do not drink alcohol if: Your health care provider tells you not to drink. You are pregnant, may be pregnant, or are planning to become pregnant. If you drink alcohol: Limit how much you have to 0-1 drink a day. Know how much alcohol is in your drink. In the [place] one drink equals one 12 oz bottle of beer (355 mL), one 5 oz glass of wine (148 mL), or one 1 oz glass of hard liquor (44 mL). Lifestyle Brush your teeth every morning and night with fluoride toothpaste. Floss one time each day. Exercise for at least 30 minutes 5 or more days each week. Do not use any products that contain nicotine or tobacco. These products  include cigarettes, chewing tobacco, and vaping devices, such as e-cigarettes. If you need help quitting, ask your health care provider. Do not use drugs. If you are sexually  active, practice safe sex. Use a condom or other form of protection to prevent STIs. If you do not wish to become pregnant, use a form of birth control. If you plan to become pregnant, see your health care provider for a prepregnancy visit. Take aspirin only as told by your health care provider. Make sure that you understand how much to take and what form to take. Work with your health care provider to find out whether it is safe and beneficial for you to take aspirin daily. Find healthy ways to manage stress, such as: Meditation, yoga, or listening to music. Journaling. Talking to a trusted person. Spending time with friends and family. Minimize exposure to UV radiation to reduce your risk of skin cancer. Safety Always wear your seat belt while driving or riding in a vehicle. Do not drive: If you have been drinking alcohol. Do not ride with someone who has been drinking. When you are tired or distracted. While texting. If you have been using any mind-altering substances or drugs. Wear a helmet and other protective equipment during sports activities. If you have firearms in your house, make sure you follow all gun safety procedures. Seek help if you have been physically or sexually abused. What's next? Visit your health care provider once a year for an annual wellness visit. Ask your health care provider how often you should have your eyes and teeth checked. Stay up to date on all vaccines. This information is not intended to replace advice given to you by your health care provider. Make sure you discuss any questions you have with your health care provider. Document Revised: 08/06/2020 Document Reviewed: 08/06/2020 Elsevier Patient Education  [place].

## 2021-12-29 NOTE — Assessment & Plan Note (Signed)
Continue spironolactone 25 mg daily and metformin XR 500 mg daily.  CMP pending.

## 2021-12-29 NOTE — Assessment & Plan Note (Signed)
Controlled.  Continue Skelaxin 800 mg for which she takes BID mostly.

## 2021-12-29 NOTE — Assessment & Plan Note (Addendum)
Controlled. Following with pain management.  Continue hydrocodone-acetaminophen 5-325 mg BID PRN. Continue Skelaxin 800 mg BID.

## 2021-12-29 NOTE — Assessment & Plan Note (Signed)
Following with psychiatry.  Continue Vyvanse 40 mg daily.

## 2021-12-29 NOTE — Assessment & Plan Note (Signed)
Controlled. Is trying to wean down.  Continue hydrocodone-acetaminophen 5-325 mg BID PRN. Continue Skelaxin 800 mg BID.   Following with pain management.

## 2021-12-29 NOTE — Assessment & Plan Note (Signed)
Intermittent, seasonal.  Continue Flonase PRN, Singulair 10 mg daily.

## 2021-12-29 NOTE — Assessment & Plan Note (Signed)
Controlled, symptoms are intermittent.   Continue omeprazole 20 mg PRN.

## 2021-12-29 NOTE — Assessment & Plan Note (Signed)
Continue atorvastatin 10 mg daily. Repeat lipid panel pending. 

## 2022-01-01 ENCOUNTER — Other Ambulatory Visit (INDEPENDENT_AMBULATORY_CARE_PROVIDER_SITE_OTHER): Payer: Managed Care, Other (non HMO)

## 2022-01-01 DIAGNOSIS — E875 Hyperkalemia: Secondary | ICD-10-CM

## 2022-01-01 LAB — POTASSIUM: Potassium: 4.5 mEq/L (ref 3.5–5.1)

## 2022-01-07 ENCOUNTER — Ambulatory Visit (INDEPENDENT_AMBULATORY_CARE_PROVIDER_SITE_OTHER): Payer: Managed Care, Other (non HMO)

## 2022-01-07 ENCOUNTER — Other Ambulatory Visit: Payer: Self-pay | Admitting: Obstetrics and Gynecology

## 2022-01-07 ENCOUNTER — Encounter: Payer: Self-pay | Admitting: Obstetrics and Gynecology

## 2022-01-07 ENCOUNTER — Ambulatory Visit: Payer: Managed Care, Other (non HMO) | Admitting: Obstetrics and Gynecology

## 2022-01-07 ENCOUNTER — Telehealth: Payer: Self-pay | Admitting: Obstetrics and Gynecology

## 2022-01-07 VITALS — BP 132/80 | Ht 63.0 in | Wt 232.0 lb

## 2022-01-07 DIAGNOSIS — N882 Stricture and stenosis of cervix uteri: Secondary | ICD-10-CM

## 2022-01-07 DIAGNOSIS — T8339XA Other mechanical complication of intrauterine contraceptive device, initial encounter: Secondary | ICD-10-CM

## 2022-01-07 MED ORDER — DOXYCYCLINE HYCLATE 100 MG PO CAPS: 100.0000 mg | ORAL_CAPSULE | Freq: Two times a day (BID) | ORAL | 0 refills | Status: DC

## 2022-01-07 NOTE — Telephone Encounter (Signed)
Please precert and schedule surgery.  My patient needs a hysteroscopic IUD removal at [facility].   Diagnosis is retained IUD.   Time needed 45 minutes.

## 2022-01-07 NOTE — Progress Notes (Signed)
GYNECOLOGY  VISIT   HPI: 56 y.o.   Married  Caucasian  female   Debra Myers with No LMP recorded. (Menstrual status: IUD).   here for   IUD removal  She had attempted IUD removal in the office and the strings broke off along with a small piece of the IUD 11/30/21.   She declined hysteroscopic removal and requested an additional attempt in the office with US guidance.   She has cervical stenosis.   She took Cytotec in preparation for visit today.  GYNECOLOGIC HISTORY: No LMP recorded. (Menstrual status: IUD). Contraception:  iud Menopausal hormone therapy:  none  Last mammogram:  06-29-21 neg birads 1:neg Last pap smear:   03-01-18 neg HPV HR neg, 05-08-13 neg HPV HR neg        OB History     Gravida  2   Para  1   Term  1   Preterm      AB  1   Living  1      SAB  1   IAB      Ectopic      Multiple      Live Births  1              Patient Active Problem List   Diagnosis Date Noted   Preventative health care 12/29/2021   History of bariatric surgery 01/21/2021   Chronic use of opiate for therapeutic purpose 07/27/2020   Acute exacerbation of chronic low back pain 12/24/2019   Binge eating disorder [id number]   Uncomplicated opioid dependence ([place]) 11/07/2019   Pharmacologic therapy 08/07/2019   Disorder of skeletal system 08/07/2019   Problems influencing health status 08/07/2019   Osteoarthritis involving multiple joints 07/20/2018   Chronic pain of left knee 04/18/2018   Pain in right knee 01/09/2018   Pain in left knee 01/09/2018   DDD (degenerative disc disease), lumbar 04/11/2017   DDD (degenerative disc disease), cervical 04/11/2017   Well woman exam 09/15/2016   Chronic pain syndrome 01/30/2016   Morbid obesity with BMI of 40.0-44.9, adult ([place]) 01/30/2016   Muscle spasm, nocturnal 08/05/2015   Musculoskeletal pain 08/05/2015   Carpal tunnel syndrome (Bilateral) (L>R) 08/05/2015   Cervical facet syndrome (Bilateral) (L>R) 08/05/2015   History  of TIA (transient ischemic attack) 06/12/2015   Cervical spondylosis (C5-6 & C6-7 DDD) 03/04/2015   Cervical (3 mm) Grade 1 Anterolisthesis of C4 over C5 03/04/2015   Chronic neck pain (3ry area of Pain) (Bilateral) (L>R) 03/04/2015   Chronic cervical radicular pain (Bilateral) (L>R) 03/04/2015   Cervical foraminal stenosis (multilevel) (Bilateral) 03/04/2015   Long term current use of opiate analgesic 02/26/2015   Encounter for therapeutic drug level monitoring 02/26/2015   Encounter for chronic pain management 02/26/2015   Numbness of upper extremity 02/26/2015   Radiculitis involving upper extremity 02/26/2015   Chronic low back pain (1ry area of Pain) (Bilateral) (L>R) 02/26/2015   Lumbar spondylosis (Bulging Disc & Severe Left Foraminal Stenosis at L3-4) 02/26/2015   Chronic lower extremity pain (2ry area of Pain) (Left) 02/26/2015   Lumbar foraminal stenosis (Severe Left L3-4) 02/26/2015   Lumbar facet syndrome (Bilateral) (L>R) 02/26/2015   Opiate use (10 MME/Day) 12/02/2014   Long term prescription opiate use 12/02/2014   IUD (intrauterine device) in place 10/01/2014   GERD (gastroesophageal reflux disease) 01/30/2014   Overweight(278.02) 02/14/2012   Hypertension 07/09/2011   HYPERTRIGLYCERIDEMIA 12/27/2008   GOITER, NONTOXIC MULTINODULAR 12/14/2006   Prediabetes 12/14/2006   Polycystic ovaries 12/14/2006  Metabolic Syndrome X [id number]   OBESITY 12/14/2006   Allergic rhinitis 12/14/2006   Asthma 12/14/2006   Hirsutism 12/14/2006   Generalized anxiety disorder 12/14/2006    Past Medical History:  Diagnosis Date   Anxiety    Asthma    ALLERGY INDUCED   Bulging lumbar disc (L3-4) 02/26/2015   Degenerative disc disease    Degenerative lumbar disc    Depression    Diabetes mellitus    TYPE 2   Elective abortion    ONE   GERD (gastroesophageal reflux disease)    takes prilosec    Hypertension    Stroke ([place]) 06/11/2015   patient describes as mini stroke    Vaginal delivery    ONE NSVD    Past Surgical History:  Procedure Laterality Date   CHOLECYSTECTOMY  2001   [place] N/A 09/10/2014   Procedure: DILATATION & CURETTAGE/HYSTEROSCOPY WITH MYOSURE/INSERTION OF IUD;  Surgeon: Terrance Mass, MD;  Location: [facility];  Service: Gynecology;  Laterality: N/A;   lap band surgery     Lapband  04/2012   mirena     inserted 09-2014   WISDOM TOOTH EXTRACTION      Current Outpatient Medications  Medication Sig Dispense Refill   albuterol (VENTOLIN HFA) 108 (90 Base) MCG/ACT inhaler Inhale 2 puffs into the lungs every 6 (six) hours as needed for wheezing or shortness of breath. 18 g 0   atorvastatin (LIPITOR) 10 MG tablet TAKE 1 TABLET(10 MG) BY MOUTH DAILY for cholesterol. 90 tablet 3   buPROPion (WELLBUTRIN XL) 300 MG 24 hr tablet Take 300 mg by mouth daily.     cetirizine (ZYRTEC) 10 MG tablet Take 10 mg by mouth at bedtime.     fluticasone (FLONASE) 50 MCG/ACT nasal spray SHAKE LIQUID AND USE 1 SPRAY IN EACH NOSTRIL TWICE DAILY AS NEEDED FOR ALLERGIES OR RHINITIS 16 g 3   HYDROcodone-acetaminophen (NORCO/VICODIN) 5-325 MG tablet Take 0.5-1 tablets by mouth 2 (two) times daily as needed for severe pain. Must last 30 days 60 tablet 0   HYDROcodone-acetaminophen (NORCO/VICODIN) 5-325 MG tablet Take 0.5-1 tablets by mouth 2 (two) times daily as needed for severe pain. Must last 30 days 60 tablet 0   [START ON 02/06/2022] HYDROcodone-acetaminophen (NORCO/VICODIN) 5-325 MG tablet Take 0.5-1 tablets by mouth 2 (two) times daily as needed for severe pain. Must last 30 days 60 tablet 0   ibuprofen (ADVIL) 200 MG tablet Take 400 mg by mouth every 8 (eight) hours as needed.     lamoTRIgine (LAMICTAL) 200 MG tablet Take 300 mg by mouth at bedtime.      LORazepam (ATIVAN) 1 MG tablet Take 0.5-1 mg by mouth every 8 (eight) hours as needed for anxiety or sleep.      metaxalone (SKELAXIN) 800 MG tablet TAKE 1 TABLET(800 MG) BY  MOUTH THREE TIMES DAILY AS NEEDED FOR MUSCLE SPASMS 90 tablet 0   metFORMIN (GLUCOPHAGE XR) 500 MG 24 hr tablet Take 1 tablet (500 mg total) by mouth daily with breakfast. 90 tablet 3   misoprostol (CYTOTEC) 200 MCG tablet Pace 1 tablet vaginal the night before procedure and place 1 tablet vaginal the morning of the procedure. 2 tablet 0   montelukast (SINGULAIR) 10 MG tablet Take 1 tablet (10 mg total) by mouth at bedtime. For allergies. 90 tablet 3   naloxone (NARCAN) nasal spray 4 mg/0.1 mL Place 1 spray into the nose as needed for up to 365 doses (for opioid-induced respiratory  depresssion). In case of emergency (overdose), spray once into each nostril. If no response within 3 minutes, repeat application and call 911. 1 each 0   PARoxetine (PAXIL-CR) 37.5 MG 24 hr tablet Take 2 tablets by mouth daily.     RA ASPIRIN EC ADULT LOW ST 81 MG EC tablet take 1 tablet by mouth once daily 30 tablet 0   spironolactone (ALDACTONE) 25 MG tablet TAKE 1 TABLET(25 MG) BY MOUTH DAILY 90 tablet 3   VYVANSE 40 MG capsule Take 40 mg by mouth daily.  0   No current facility-administered medications for this visit.     ALLERGIES: Oxycodone, Penicillins, Red dye, and Sulfa antibiotics  Family History  Problem Relation Age of Onset   Cancer Mother        thyroid cancer   Hypertension Mother    Hypertension Father    Diabetes Father    Asthma Maternal Grandmother    Stroke Maternal Grandmother    Stroke Maternal Grandfather     Social History   Socioeconomic History   Marital status: Married    Spouse name: Not on file   Number of children: Not on file   Years of education: Not on file   Highest education level: Not on file  Occupational History   Not on file  Tobacco Use   Smoking status: Never   Smokeless tobacco: Never  Vaping Use   Vaping Use: Never used  Substance and Sexual Activity   Alcohol use: Never    Alcohol/week: 0.0 standard drinks of alcohol   Drug use: No   Sexual activity:  Yes    Birth control/protection: I.U.D.    Comment: 1st intercourse 56 yo-Fewer than 5 partners-Mirena  inserted 09-2014  Other Topics Concern   Not on file  Social History Narrative   Not on file   Social Determinants of Health   Financial Resource Strain: Not on file  Food Insecurity: Not on file  Transportation Needs: Not on file  Physical Activity: Not on file  Stress: Not on file  Social Connections: Not on file  Intimate Partner Violence: Not on file    Review of Systems  See HPI.   PHYSICAL EXAMINATION:    BP 132/80 (BP Location: Right Arm, Patient Position: Sitting, Cuff Size: Large)   Ht 5\' 3"  (1.6 m)   Wt 232 lb (105.2 kg)   BMI 41.10 kg/m     General appearance: alert, cooperative and appears stated age Head: Normocephalic, without obvious abnormality, atraumatic Neck: no adenopathy, supple, symmetrical, trachea midline and thyroid normal to inspection and palpation Lungs: clear to auscultation bilaterally Heart: regular rate and rhythm Neurologic: Grossly normal  Pelvic: External genitalia:  no lesions              Urethra:  normal appearing urethra with no masses, tenderness or lesions              Bartholins and Skenes: normal                 Vagina: normal appearing vagina with normal color and discharge, no lesions              Cervix: no lesions                Bimanual Exam:  Uterus:  normal size, contour, position, consistency, mobility, non-tender              Adnexa: no mass, fullness, tenderness  Pelvic US Uterus 5.59 x 3.44 x 2.69 cm.  IUD noted in endometrial canal.  It is 1.8 cm superior to the external os.  Left ovary 2.38 x 1.67 x 1.16 cm.   Atrophic. Right ovary 2.55 x 1.10 x 1.43 mm.  Atrophic.  No adnexal mass.  No free fluid.   US Guided Attempted IUD removal.  Consent done.  Sterile prep with betadine.  10 cc 1% lidocaine paracervical block, lot [id number], exp 02/23/23 Tenaculum to anterior cervical lip and then posterior  cervical lip.  Os finder used.  Dilators used  Ring forceps, dressing forceps, and IUD grasper used to attempt IUD removal under transabdominal US guidance. Not successful. Procedure abandoned.  Chaperone was present for exam:  Kimalexis  ASSESSMENT  Retained IUD.   PLAN  Discussion of hysteroscopy with IUD removal.  Risks include but are not limited to bleeding, infection, damage to surrounding organs including uterine perforation requiring laparoscopy, pulmonary edema, reaction to anesthesia. Patient wishes to proceed. Doxycycline 100 mg po bid x 24 hours for infection prevention.    An After Visit Summary was printed and given to the patient.  20 min  total time was spent for this patient encounter, including preparation, face-to-face counseling with the patient, coordination of care, and documentation of the encounter in addition to attempted IUD removal.

## 2022-01-13 ENCOUNTER — Other Ambulatory Visit: Payer: Self-pay | Admitting: Primary Care

## 2022-01-13 DIAGNOSIS — E282 Polycystic ovarian syndrome: Secondary | ICD-10-CM

## 2022-01-13 DIAGNOSIS — R7303 Prediabetes: Secondary | ICD-10-CM

## 2022-01-13 DIAGNOSIS — L680 Hirsutism: Secondary | ICD-10-CM

## 2022-01-13 DIAGNOSIS — E781 Pure hyperglyceridemia: Secondary | ICD-10-CM

## 2022-01-19 ENCOUNTER — Ambulatory Visit: Payer: Managed Care, Other (non HMO) | Admitting: Obstetrics and Gynecology

## 2022-01-19 NOTE — Telephone Encounter (Signed)
Spoke with patient.  Advised per Dr. Edward Jolly. OV cancelled for today. PUS and consult scheduled for 12/5 at 1600, consult to follow. Future order placed. Patient asking if Cytotec will be need prior to visit? Advised I will forward to Dr. Edward Jolly to review and I will f/u, patient agreeable.   Dr. Edward Jolly -please review and advise.

## 2022-01-19 NOTE — Telephone Encounter (Signed)
Patient will need a pelvic ultrasound.

## 2022-01-19 NOTE — Progress Notes (Deleted)
GYNECOLOGY  VISIT   HPI: 56 y.o.   Married  Caucasian  female   [name] with No LMP recorded. (Menstrual status: IUD).   here for   IUD check  GYNECOLOGIC HISTORY: No LMP recorded. (Menstrual status: IUD). Contraception:  Mirena IUD inserted 8/16 Menopausal hormone therapy:  n/a  Last mammogram:  07/01/21, Breast Composition Category B, BI-RADS CATEGORY 1 Negative Last pap smear:   03/01/18 negative: HR HPV Negative, 05/18/13 negative: HR HPV negative        OB History     Gravida  2   Para  1   Term  1   Preterm      AB  1   Living  1      SAB  1   IAB      Ectopic      Multiple      Live Births  1              Patient Active Problem List   Diagnosis Date Noted   Preventative health care 12/29/2021   History of bariatric surgery 01/21/2021   Chronic use of opiate for therapeutic purpose 07/27/2020   Acute exacerbation of chronic low back pain 12/24/2019   Binge eating disorder 11/23/2019   Uncomplicated opioid dependence ([facility]) 11/07/2019   Pharmacologic therapy 08/07/2019   Disorder of skeletal system 08/07/2019   Problems influencing health status 08/07/2019   Osteoarthritis involving multiple joints 07/20/2018   Chronic pain of left knee 04/18/2018   Pain in right knee 01/09/2018   Pain in left knee 01/09/2018   DDD (degenerative disc disease), lumbar 04/11/2017   DDD (degenerative disc disease), cervical 04/11/2017   Well woman exam 09/15/2016   Chronic pain syndrome 01/30/2016   Morbid obesity with BMI of 40.0-44.9, adult ([facility]) 01/30/2016   Muscle spasm, nocturnal 08/05/2015   Musculoskeletal pain 08/05/2015   Carpal tunnel syndrome (Bilateral) (L>R) 08/05/2015   Cervical facet syndrome (Bilateral) (L>R) 08/05/2015   History of TIA (transient ischemic attack) 06/12/2015   Cervical spondylosis (C5-6 & C6-7 DDD) 03/04/2015   Cervical (3 mm) Grade 1 Anterolisthesis of C4 over C5 03/04/2015   Chronic neck pain (3ry area of Pain) (Bilateral) (L>R)  03/04/2015   Chronic cervical radicular pain (Bilateral) (L>R) 03/04/2015   Cervical foraminal stenosis (multilevel) (Bilateral) 03/04/2015   Long term current use of opiate analgesic 02/26/2015   Encounter for therapeutic drug level monitoring 02/26/2015   Encounter for chronic pain management 02/26/2015   Numbness of upper extremity 02/26/2015   Radiculitis involving upper extremity 02/26/2015   Chronic low back pain (1ry area of Pain) (Bilateral) (L>R) 02/26/2015   Lumbar spondylosis (Bulging Disc & Severe Left Foraminal Stenosis at L3-4) 02/26/2015   Chronic lower extremity pain (2ry area of Pain) (Left) 02/26/2015   Lumbar foraminal stenosis (Severe Left L3-4) 02/26/2015   Lumbar facet syndrome (Bilateral) (L>R) 02/26/2015   Opiate use (10 MME/Day) 12/02/2014   Long term prescription opiate use 12/02/2014   IUD (intrauterine device) in place 10/01/2014   GERD (gastroesophageal reflux disease) 01/30/2014   Overweight(278.02) 02/14/2012   Hypertension 07/09/2011   HYPERTRIGLYCERIDEMIA 12/27/2008   GOITER, NONTOXIC MULTINODULAR 12/14/2006   Prediabetes 12/14/2006   Polycystic ovaries 12/14/2006   Metabolic Syndrome X 12/14/2006   OBESITY 12/14/2006   Allergic rhinitis 12/14/2006   Asthma 12/14/2006   Hirsutism 12/14/2006   Generalized anxiety disorder 12/14/2006    Past Medical History:  Diagnosis Date   Anxiety    Asthma  ALLERGY INDUCED   Bulging lumbar disc (L3-4) 02/26/2015   Degenerative disc disease    Degenerative lumbar disc    Depression    Diabetes mellitus    TYPE 2   Elective abortion    ONE   GERD (gastroesophageal reflux disease)    takes prilosec    Hypertension    Stroke ([facility]) 06/11/2015   patient describes as mini stroke   Vaginal delivery    ONE NSVD    Past Surgical History:  Procedure Laterality Date   CHOLECYSTECTOMY  2001   DILATATION & CURETTAGE/HYSTEROSCOPY WITH MYOSURE N/A 09/10/2014   Procedure: DILATATION & CURETTAGE/HYSTEROSCOPY  WITH MYOSURE/INSERTION OF IUD;  Surgeon: Ok Edwards, MD;  Location: [facility];  Service: Gynecology;  Laterality: N/A;   lap band surgery     Lapband  04/2012   mirena     inserted 09-2014   WISDOM TOOTH EXTRACTION      Current Outpatient Medications  Medication Sig Dispense Refill   albuterol (VENTOLIN HFA) 108 (90 Base) MCG/ACT inhaler Inhale 2 puffs into the lungs every 6 (six) hours as needed for wheezing or shortness of breath. 18 g 0   atorvastatin (LIPITOR) 10 MG tablet TAKE 1 TABLET(10 MG) BY MOUTH DAILY FOR CHOLESTEROL 90 tablet 3   buPROPion (WELLBUTRIN XL) 300 MG 24 hr tablet Take 300 mg by mouth daily.     cetirizine (ZYRTEC) 10 MG tablet Take 10 mg by mouth at bedtime.     doxycycline (VIBRAMYCIN) 100 MG capsule Take 1 capsule (100 mg total) by mouth 2 (two) times daily. Take for one day for infection prevention. 2 capsule 0   fluticasone (FLONASE) 50 MCG/ACT nasal spray SHAKE LIQUID AND USE 1 SPRAY IN EACH NOSTRIL TWICE DAILY AS NEEDED FOR ALLERGIES OR RHINITIS 16 g 3   HYDROcodone-acetaminophen (NORCO/VICODIN) 5-325 MG tablet Take 0.5-1 tablets by mouth 2 (two) times daily as needed for severe pain. Must last 30 days 60 tablet 0   HYDROcodone-acetaminophen (NORCO/VICODIN) 5-325 MG tablet Take 0.5-1 tablets by mouth 2 (two) times daily as needed for severe pain. Must last 30 days 60 tablet 0   [START ON 02/06/2022] HYDROcodone-acetaminophen (NORCO/VICODIN) 5-325 MG tablet Take 0.5-1 tablets by mouth 2 (two) times daily as needed for severe pain. Must last 30 days 60 tablet 0   ibuprofen (ADVIL) 200 MG tablet Take 400 mg by mouth every 8 (eight) hours as needed.     lamoTRIgine (LAMICTAL) 200 MG tablet Take 300 mg by mouth at bedtime.      LORazepam (ATIVAN) 1 MG tablet Take 0.5-1 mg by mouth every 8 (eight) hours as needed for anxiety or sleep.      metaxalone (SKELAXIN) 800 MG tablet TAKE 1 TABLET(800 MG) BY MOUTH THREE TIMES DAILY AS NEEDED FOR MUSCLE SPASMS 90 tablet 0    metFORMIN (GLUCOPHAGE-XR) 500 MG 24 hr tablet TAKE 1 TABLET(500 MG) BY MOUTH DAILY WITH BREAKFAST 90 tablet 3   misoprostol (CYTOTEC) 200 MCG tablet Pace 1 tablet vaginal the night before procedure and place 1 tablet vaginal the morning of the procedure. 2 tablet 0   montelukast (SINGULAIR) 10 MG tablet Take 1 tablet (10 mg total) by mouth at bedtime. For allergies. 90 tablet 3   naloxone (NARCAN) nasal spray 4 mg/0.1 mL Place 1 spray into the nose as needed for up to 365 doses (for opioid-induced respiratory depresssion). In case of emergency (overdose), spray once into each nostril. If no response within 3 minutes, repeat application and  call 911. 1 each 0   PARoxetine (PAXIL-CR) 37.5 MG 24 hr tablet Take 2 tablets by mouth daily.     RA ASPIRIN EC ADULT LOW ST 81 MG EC tablet take 1 tablet by mouth once daily 30 tablet 0   spironolactone (ALDACTONE) 25 MG tablet TAKE 1 TABLET(25 MG) BY MOUTH DAILY 90 tablet 3   VYVANSE 40 MG capsule Take 40 mg by mouth daily.  0   No current facility-administered medications for this visit.     ALLERGIES: Oxycodone, Penicillins, Red dye, and Sulfa antibiotics  Family History  Problem Relation Age of Onset   Cancer Mother        thyroid cancer   Hypertension Mother    Hypertension Father    Diabetes Father    Asthma Maternal Grandmother    Stroke Maternal Grandmother    Stroke Maternal Grandfather     Social History   Socioeconomic History   Marital status: Married    Spouse name: Not on file   Number of children: Not on file   Years of education: Not on file   Highest education level: Not on file  Occupational History   Not on file  Tobacco Use   Smoking status: Never   Smokeless tobacco: Never  Vaping Use   Vaping Use: Never used  Substance and Sexual Activity   Alcohol use: Never    Alcohol/week: 0.0 standard drinks of alcohol   Drug use: No   Sexual activity: Yes    Birth control/protection: I.U.D.    Comment: 1st intercourse 56  yo-Fewer than 5 partners-Mirena  inserted 09-2014  Other Topics Concern   Not on file  Social History Narrative   Not on file   Social Determinants of Health   Financial Resource Strain: Not on file  Food Insecurity: Not on file  Transportation Needs: Not on file  Physical Activity: Not on file  Stress: Not on file  Social Connections: Not on file  Intimate Partner Violence: Not on file    Review of Systems  PHYSICAL EXAMINATION:    There were no vitals taken for this visit.    General appearance: alert, cooperative and appears stated age Head: Normocephalic, without obvious abnormality, atraumatic Neck: no adenopathy, supple, symmetrical, trachea midline and thyroid normal to inspection and palpation Lungs: clear to auscultation bilaterally Breasts: normal appearance, no masses or tenderness, No nipple retraction or dimpling, No nipple discharge or bleeding, No axillary or supraclavicular adenopathy Heart: regular rate and rhythm Abdomen: soft, non-tender, no masses,  no organomegaly Extremities: extremities normal, atraumatic, no cyanosis or edema Skin: Skin color, texture, turgor normal. No rashes or lesions Lymph nodes: Cervical, supraclavicular, and axillary nodes normal. No abnormal inguinal nodes palpated Neurologic: Grossly normal  Pelvic: External genitalia:  no lesions              Urethra:  normal appearing urethra with no masses, tenderness or lesions              Bartholins and Skenes: normal                 Vagina: normal appearing vagina with normal color and discharge, no lesions              Cervix: no lesions                Bimanual Exam:  Uterus:  normal size, contour, position, consistency, mobility, non-tender  Adnexa: no mass, fullness, tenderness              Rectal exam: {yes no:[id number]}.  Confirms.              Anus:  normal sphincter tone, no lesions  Chaperone was present for exam:  ***  ASSESSMENT     PLAN     An After  Visit Summary was printed and given to the patient.  ______ minutes face to face time of which over 50% was spent in counseling.

## 2022-01-19 NOTE — Telephone Encounter (Signed)
Call to patient to schedule surgery. Patient states she had heavy bleeding following second IUD removal attempt on 01/07/22. States she went to the bathroom and thinks that IUD had expelled. Requesting this be checked prior to scheduling surgery. OV scheduled for today at 1630. Advised patient I will review with Dr. Edward Jolly and return call only if any additional recommendations, otherwise keep OV as scheduled. Patient agreeable.   Dr. Edward Jolly -please review and advise if any additional recommendations.

## 2022-01-19 NOTE — Telephone Encounter (Signed)
Thank you for scheduling the pelvic US.   No Cytotec prescription at this time.

## 2022-01-20 NOTE — Telephone Encounter (Signed)
Encounter reviewed and closed.  

## 2022-01-20 NOTE — Progress Notes (Unsigned)
GYNECOLOGY  VISIT   HPI: 56 y.o.   Married  Caucasian  female   [name] with No LMP recorded. (Menstrual status: IUD).   here for U/S consult.  She had attempted removal of her Mirena IUD in the office and strings broke off along with small piece of the IUD on 11/30/21.   She was scheduled for hysteroscopic removal of the IUD for 12/21/21, and she requested to cancel the surgery and instead try with ultrasound guided removal.  This was attempted on 01/07/22, and it was not successful.   Per telephone contact on 01/19/22, patient stated she passed something per vagina and thought that she had expelled the IUD.   GYNECOLOGIC HISTORY: No LMP recorded. (Menstrual status: IUD). Contraception:  IUD Menopausal hormone therapy:  n/a Last mammogram:  07/01/21, Breast Composition Category B, BI-RADS CATEGORY 1 Negative Last pap smear:   03/01/18 negative: HR HPV negative, 05/18/13 negative: HR HPV negative        OB History     Gravida  2   Para  1   Term  1   Preterm      AB  1   Living  1      SAB  1   IAB      Ectopic      Multiple      Live Births  1              Patient Active Problem List   Diagnosis Date Noted   Preventative health care 12/29/2021   History of bariatric surgery 01/21/2021   Chronic use of opiate for therapeutic purpose 07/27/2020   Acute exacerbation of chronic low back pain 12/24/2019   Binge eating disorder 11/23/2019   Uncomplicated opioid dependence (HCC) 11/07/2019   Pharmacologic therapy 08/07/2019   Disorder of skeletal system 08/07/2019   Problems influencing health status 08/07/2019   Osteoarthritis involving multiple joints 07/20/2018   Chronic pain of left knee 04/18/2018   Pain in right knee 01/09/2018   Pain in left knee 01/09/2018   DDD (degenerative disc disease), lumbar 04/11/2017   DDD (degenerative disc disease), cervical 04/11/2017   Well woman exam 09/15/2016   Chronic pain syndrome 01/30/2016   Morbid obesity with  BMI of 40.0-44.9, adult (HCC) 01/30/2016   Muscle spasm, nocturnal 08/05/2015   Musculoskeletal pain 08/05/2015   Carpal tunnel syndrome (Bilateral) (L>R) 08/05/2015   Cervical facet syndrome (Bilateral) (L>R) 08/05/2015   History of TIA (transient ischemic attack) 06/12/2015   Cervical spondylosis (C5-6 & C6-7 DDD) 03/04/2015   Cervical (3 mm) Grade 1 Anterolisthesis of C4 over C5 03/04/2015   Chronic neck pain (3ry area of Pain) (Bilateral) (L>R) 03/04/2015   Chronic cervical radicular pain (Bilateral) (L>R) 03/04/2015   Cervical foraminal stenosis (multilevel) (Bilateral) 03/04/2015   Long term current use of opiate analgesic 02/26/2015   Encounter for therapeutic drug level monitoring 02/26/2015   Encounter for chronic pain management 02/26/2015   Numbness of upper extremity 02/26/2015   Radiculitis involving upper extremity 02/26/2015   Chronic low back pain (1ry area of Pain) (Bilateral) (L>R) 02/26/2015   Lumbar spondylosis (Bulging Disc & Severe Left Foraminal Stenosis at L3-4) 02/26/2015   Chronic lower extremity pain (2ry area of Pain) (Left) 02/26/2015   Lumbar foraminal stenosis (Severe Left L3-4) 02/26/2015   Lumbar facet syndrome (Bilateral) (L>R) 02/26/2015   Opiate use (10 MME/Day) 12/02/2014   Long term prescription opiate use 12/02/2014   IUD (intrauterine device) in place 10/01/2014  GERD (gastroesophageal reflux disease) 01/30/2014   Overweight(278.02) 02/14/2012   Hypertension 07/09/2011   HYPERTRIGLYCERIDEMIA 12/27/2008   GOITER, NONTOXIC MULTINODULAR 12/14/2006   Prediabetes 12/14/2006   Polycystic ovaries 12/14/2006   Metabolic Syndrome X 12/14/2006   OBESITY 12/14/2006   Allergic rhinitis 12/14/2006   Asthma 12/14/2006   Hirsutism 12/14/2006   Generalized anxiety disorder 12/14/2006    Past Medical History:  Diagnosis Date   Anxiety    Asthma    ALLERGY INDUCED   Bulging lumbar disc (L3-4) 02/26/2015   Degenerative disc disease    Degenerative  lumbar disc    Depression    Diabetes mellitus    TYPE 2   Elective abortion    ONE   GERD (gastroesophageal reflux disease)    takes prilosec    Hypertension    Stroke (HCC) 06/11/2015   patient describes as mini stroke   Vaginal delivery    ONE NSVD    Past Surgical History:  Procedure Laterality Date   CHOLECYSTECTOMY  2001   DILATATION & CURETTAGE/HYSTEROSCOPY WITH MYOSURE N/A 09/10/2014   Procedure: DILATATION & CURETTAGE/HYSTEROSCOPY WITH MYOSURE/INSERTION OF IUD;  Surgeon: Ok Edwards, MD;  Location: [facility];  Service: Gynecology;  Laterality: N/A;   lap band surgery     Lapband  04/2012   mirena     inserted 09-2014   WISDOM TOOTH EXTRACTION      Current Outpatient Medications  Medication Sig Dispense Refill   albuterol (VENTOLIN HFA) 108 (90 Base) MCG/ACT inhaler Inhale 2 puffs into the lungs every 6 (six) hours as needed for wheezing or shortness of breath. 18 g 0   atorvastatin (LIPITOR) 10 MG tablet TAKE 1 TABLET(10 MG) BY MOUTH DAILY FOR CHOLESTEROL 90 tablet 3   buPROPion (WELLBUTRIN XL) 300 MG 24 hr tablet Take 300 mg by mouth daily.     cetirizine (ZYRTEC) 10 MG tablet Take 10 mg by mouth at bedtime.     doxycycline (VIBRAMYCIN) 100 MG capsule Take 1 capsule (100 mg total) by mouth 2 (two) times daily. Take for one day for infection prevention. 2 capsule 0   fluticasone (FLONASE) 50 MCG/ACT nasal spray SHAKE LIQUID AND USE 1 SPRAY IN EACH NOSTRIL TWICE DAILY AS NEEDED FOR ALLERGIES OR RHINITIS 16 g 3   HYDROcodone-acetaminophen (NORCO/VICODIN) 5-325 MG tablet Take 0.5-1 tablets by mouth 2 (two) times daily as needed for severe pain. Must last 30 days 60 tablet 0   [START ON 02/06/2022] HYDROcodone-acetaminophen (NORCO/VICODIN) 5-325 MG tablet Take 0.5-1 tablets by mouth 2 (two) times daily as needed for severe pain. Must last 30 days 60 tablet 0   ibuprofen (ADVIL) 200 MG tablet Take 400 mg by mouth every 8 (eight) hours as needed.     lamoTRIgine (LAMICTAL) 200  MG tablet Take 300 mg by mouth at bedtime.      LORazepam (ATIVAN) 1 MG tablet Take 0.5-1 mg by mouth every 8 (eight) hours as needed for anxiety or sleep.      metaxalone (SKELAXIN) 800 MG tablet TAKE 1 TABLET(800 MG) BY MOUTH THREE TIMES DAILY AS NEEDED FOR MUSCLE SPASMS 90 tablet 0   metFORMIN (GLUCOPHAGE-XR) 500 MG 24 hr tablet TAKE 1 TABLET(500 MG) BY MOUTH DAILY WITH BREAKFAST 90 tablet 3   misoprostol (CYTOTEC) 200 MCG tablet Pace 1 tablet vaginal the night before procedure and place 1 tablet vaginal the morning of the procedure. 2 tablet 0   montelukast (SINGULAIR) 10 MG tablet Take 1 tablet (10 mg total) by mouth  at bedtime. For allergies. 90 tablet 3   naloxone (NARCAN) nasal spray 4 mg/0.1 mL Place 1 spray into the nose as needed for up to 365 doses (for opioid-induced respiratory depresssion). In case of emergency (overdose), spray once into each nostril. If no response within 3 minutes, repeat application and call 911. 1 each 0   PARoxetine (PAXIL-CR) 37.5 MG 24 hr tablet Take 2 tablets by mouth daily.     RA ASPIRIN EC ADULT LOW ST 81 MG EC tablet take 1 tablet by mouth once daily 30 tablet 0   spironolactone (ALDACTONE) 25 MG tablet TAKE 1 TABLET(25 MG) BY MOUTH DAILY 90 tablet 3   VYVANSE 40 MG capsule Take 40 mg by mouth daily.  0   HYDROcodone-acetaminophen (NORCO/VICODIN) 5-325 MG tablet Take 0.5-1 tablets by mouth 2 (two) times daily as needed for severe pain. Must last 30 days 60 tablet 0   No current facility-administered medications for this visit.     ALLERGIES: Oxycodone, Penicillins, Red dye, and Sulfa antibiotics  Family History  Problem Relation Age of Onset   Cancer Mother        thyroid cancer   Hypertension Mother    Hypertension Father    Diabetes Father    Asthma Maternal Grandmother    Stroke Maternal Grandmother    Stroke Maternal Grandfather     Social History   Socioeconomic History   Marital status: Married    Spouse name: Not on file   Number  of children: Not on file   Years of education: Not on file   Highest education level: Not on file  Occupational History   Not on file  Tobacco Use   Smoking status: Never   Smokeless tobacco: Never  Vaping Use   Vaping Use: Never used  Substance and Sexual Activity   Alcohol use: Never    Alcohol/week: 0.0 standard drinks of alcohol   Drug use: No   Sexual activity: Yes    Birth control/protection: I.U.D.    Comment: 1st intercourse 56 yo-Fewer than 5 partners-Mirena  inserted 09-2014  Other Topics Concern   Not on file  Social History Narrative   Not on file   Social Determinants of Health   Financial Resource Strain: Not on file  Food Insecurity: Not on file  Transportation Needs: Not on file  Physical Activity: Not on file  Stress: Not on file  Social Connections: Not on file  Intimate Partner Violence: Not on file    Review of Systems  All other systems reviewed and are negative.   PHYSICAL EXAMINATION:    BP 134/80 (BP Location: Right Arm, Patient Position: Sitting, Cuff Size: Large)   Ht 5\' 3"  (1.6 m)   Wt 232 lb (105.2 kg)   BMI 41.10 kg/m     General appearance: alert, cooperative and appears stated age Head: Normocephalic, without obvious abnormality, atraumatic Neck: no adenopathy, supple, symmetrical, trachea midline and thyroid normal to inspection and palpation Lungs: clear to auscultation bilaterally Heart: regular rate and rhythm  Pelvic: deferred.  Pelvic  Uterus 5.82 x 4.49 x 2.59 cm.  IUD in endometrial canal.  Left ovary 2.36 x 1.4 cm.  Right ovary 2.51 x 1.58 cm.  No adnexal masses.  No free fluid.   ASSESSMENT  Retained IUD.   PLAN  Proceed with hysteroscopy with IUD removal. Risks and benefits reviewed. Risks include but are not limited to bleeding, infection, damage to surrounding organs including uterine perforation requiring hospitalization and laparoscopy,  reaction to anesthesia, DVT, PE, death, need for further treatment  and surgery. Surgical expectations and recovery discussed.  Patient wishes to proceed. Rx for Cytotec 200 mcg pv the night prior to procedure and then 200 mcg pv the am of the procedure.    An After Visit Summary was printed and given to the patient.  27 min  total time was spent for this patient encounter, including preparation, face-to-face counseling with the patient, coordination of care, and documentation of the encounter.

## 2022-01-26 ENCOUNTER — Ambulatory Visit (INDEPENDENT_AMBULATORY_CARE_PROVIDER_SITE_OTHER): Payer: Managed Care, Other (non HMO)

## 2022-01-26 ENCOUNTER — Ambulatory Visit (INDEPENDENT_AMBULATORY_CARE_PROVIDER_SITE_OTHER): Payer: Managed Care, Other (non HMO) | Admitting: Obstetrics and Gynecology

## 2022-01-26 ENCOUNTER — Encounter: Payer: Self-pay | Admitting: Obstetrics and Gynecology

## 2022-01-26 VITALS — BP 134/80 | Ht 63.0 in | Wt 232.0 lb

## 2022-01-26 DIAGNOSIS — T8339XA Other mechanical complication of intrauterine contraceptive device, initial encounter: Secondary | ICD-10-CM

## 2022-01-26 DIAGNOSIS — N882 Stricture and stenosis of cervix uteri: Secondary | ICD-10-CM

## 2022-01-26 MED ORDER — MISOPROSTOL 200 MCG PO TABS: ORAL_TABLET | ORAL | 0 refills | Status: DC

## 2022-01-26 NOTE — Patient Instructions (Signed)
Hysteroscopy Hysteroscopy is a procedure used to look inside a woman's womb (uterus). This may be done for various reasons, including: To look for tumors and other growths in the uterus. To evaluate abnormal bleeding, fibroid tumors, polyps, scar tissue, or uterine cancer. To determine why a woman is unable to get pregnant or has had repeated pregnancy losses (miscarriages). To locate an intrauterine device (IUD). To place a birth control device into the fallopian tubes. During this procedure, a thin, flexible tube with a small light and camera (hysteroscope) is used to examine the uterus. The camera sends images to a monitor in the room so that your health care provider can view the inside of your uterus. A hysteroscopy should be done right after a menstrual period. Tell a health care provider about: Any allergies you have. All medicines you are taking, including vitamins, herbs, eye drops, creams, and over-the-counter medicines. Any problems you or family members have had with anesthetic medicines. Any bleeding problems you have. Any surgeries you have had. Any medical conditions you have. Whether you are pregnant or may be pregnant. Whether you have been diagnosed with an sexually transmitted infection (STI) or you think you have an STI. What are the risks? Your health care provider will talk with you about risks. These may include: Excessive bleeding. Infection. Damage to the uterus or other structures or organs. Allergic reaction to medicines or fluids that are used in the procedure. What happens before the procedure? When to stop eating and drinking Follow instructions from your health care provider about what you may eat and drink. These may include: 8 hours before your procedure Stop eating most foods. Do not eat meat, fried foods, or fatty foods. Eat only light foods, such as toast or crackers. All liquids are okay except energy drinks and alcohol. 6 hours before your  procedure Stop eating. Drink only clear liquids, such as water, clear fruit juice, black coffee, plain tea, and sports drinks. Do not drink energy drinks or alcohol. 2 hours before your procedure Stop drinking all liquids. You may be allowed to take medicines with small sips of water. If you do not follow your health care provider's instructions, your procedure may be delayed or canceled. Medicines Ask your health care provider about: Changing or stopping your regular medicines. These include any diabetes medicines or blood thinners you take. Taking medicines such as aspirin and ibuprofen. These medicines can thin your blood. Do not take them unless your health care provider tells you to. Taking over-the-counter medicines, vitamins, herbs, and supplements. Medicine may be placed in your cervix the day before the procedure. This medicine causes the cervix to open (dilate). The larger opening makes it easier for the hysteroscope to be inserted into the uterus during the procedure. General instructions Ask your health care provider: What steps will be taken to help prevent infection. These steps may include: Washing skin with a soap that kills germs. Taking antibiotic medicine. Do not use any products that contain nicotine or tobacco for at least 4 weeks before the procedure. These products include cigarettes, chewing tobacco, and vaping devices, such as e-cigarettes. If you need help quitting, ask your health care provider. If you will be going home right after the procedure, plan to have a responsible adult: Take you home from the hospital or clinic. You will not be allowed to drive. Care for you for the time you are told. Empty your bladder before the procedure begins. What happens during the procedure? An IV will be   inserted into one of your veins. You may be given: A sedative. This helps you relax. Anesthesia. This will: Numb certain areas of your body. Make you fall asleep for  surgery. A hysteroscope will be inserted through your vagina and into your uterus. Air or fluid will be used to enlarge your uterus to allow your health care provider to see it better. The amount of fluid used will be carefully checked throughout the procedure. In some cases, tissue may be gently scraped from inside the uterus and sent to a lab for testing (biopsy). The procedure may vary among health care providers and hospitals. What happens after the procedure? Your blood pressure, heart rate, breathing rate, and blood oxygen level will be monitored until you leave the hospital or clinic. You may have cramps. You may be given medicines for this. You may have bleeding, which may vary from light spotting to menstrual-like bleeding. This is normal. If you had a biopsy, it is up to you to get the results. Ask your health care provider, or the department that is doing the procedure, when your results will be ready. Summary Hysteroscopy is a procedure that is used to look inside a woman's womb (uterus). After the procedure, you may have bleeding, which varies from light spotting to menstrual-like bleeding. This is normal. You may also have cramps. Plan to have a responsible adult take you home from the hospital or clinic. If you had a biopsy, it is up to you to get the results. Ask your health care provider, or the department that is doing the procedure, when your results will be ready. This information is not intended to replace advice given to you by your health care provider. Make sure you discuss any questions you have with your health care provider. Document Revised: 05/25/2021 Document Reviewed: 05/25/2021 Elsevier Patient Education  2023 [employer]  

## 2022-01-27 ENCOUNTER — Telehealth: Payer: Self-pay | Admitting: Obstetrics and Gynecology

## 2022-01-27 NOTE — Telephone Encounter (Signed)
Please precert and schedule hysteroscopic IUD removal with me at [facility].  Time needed is 45 minutes.   Diagnosis:  retained IUD.  No preop visit is needed.

## 2022-01-28 NOTE — Telephone Encounter (Signed)
Call to patient, no answer. Voicemail did not pick up to leave message.   MyChart message to patient.

## 2022-01-29 NOTE — Telephone Encounter (Signed)
Spoke with patient. Reviewed surgery dates. Patient request to proceed with surgery on 02/11/22.  Advised patient I will forward to business office for return call. I will return call once surgery date and time confirmed. Patient verbalizes understanding and is agreeable.   Surgery request sent.

## 2022-02-01 NOTE — Telephone Encounter (Signed)
Left message to call Pallavi Clifton, RN at [facility], [phone].  

## 2022-02-03 NOTE — Telephone Encounter (Signed)
Spoke with patient. Surgery date request confirmed.  Advised surgery is scheduled for [facility] 02/11/22.  Surgery instruction sheet and hospital brochure reviewed, printed copy will be mailed.  Patient verbalizes understanding and is agreeable.   Patient notified of benefits per [place].   Routing to provider. Encounter closed.

## 2022-02-03 NOTE — Telephone Encounter (Signed)
Left message to call Zuri Bradway, RN at [facility], [phone].  

## 2022-02-09 ENCOUNTER — Encounter (HOSPITAL_BASED_OUTPATIENT_CLINIC_OR_DEPARTMENT_OTHER): Payer: Self-pay | Admitting: Obstetrics and Gynecology

## 2022-02-10 ENCOUNTER — Encounter (HOSPITAL_BASED_OUTPATIENT_CLINIC_OR_DEPARTMENT_OTHER): Payer: Self-pay | Admitting: Obstetrics and Gynecology

## 2022-02-10 NOTE — H&P (Signed)
Debra Salles, MD Physician Gynecology Progress Notes    Signed Encounter Date:  01/26/2022   Signed      GYNECOLOGY  VISIT   HPI: 56 y.o.   Married  Caucasian  female   [name] with No LMP recorded. (Menstrual status: IUD).   here for U/S consult.   She had attempted removal of her Mirena IUD in the office and strings broke off along with small piece of the IUD on 11/30/21.   She was scheduled for hysteroscopic removal of the IUD for 12/21/21, and she requested to cancel the surgery and instead try with ultrasound guided removal.  This was attempted on 01/07/22, and it was not successful.    Per telephone contact on 01/19/22, patient stated she passed something per vagina and thought that she had expelled the IUD.    GYNECOLOGIC HISTORY: No LMP recorded. (Menstrual status: IUD). Contraception:  IUD Menopausal hormone therapy:  n/a Last mammogram:  07/01/21, Breast Composition Category B, BI-RADS CATEGORY 1 Negative Last pap smear:   03/01/18 negative: HR HPV negative, 05/18/13 negative: HR HPV negative        OB History       Gravida  2   Para  1   Term  1   Preterm      AB  1   Living  1        SAB  1   IAB      Ectopic      Multiple      Live Births  1                     Patient Active Problem List    Diagnosis Date Noted   Preventative health care 12/29/2021   History of bariatric surgery 01/21/2021   Chronic use of opiate for therapeutic purpose 07/27/2020   Acute exacerbation of chronic low back pain 12/24/2019   Binge eating disorder 11/23/2019   Uncomplicated opioid dependence (HCC) 11/07/2019   Pharmacologic therapy 08/07/2019   Disorder of skeletal system 08/07/2019   Problems influencing health status 08/07/2019   Osteoarthritis involving multiple joints 07/20/2018   Chronic pain of left knee 04/18/2018   Pain in right knee 01/09/2018   Pain in left knee 01/09/2018   DDD (degenerative disc disease), lumbar 04/11/2017    DDD (degenerative disc disease), cervical 04/11/2017   Well woman exam 09/15/2016   Chronic pain syndrome 01/30/2016   Morbid obesity with BMI of 40.0-44.9, adult (HCC) 01/30/2016   Muscle spasm, nocturnal 08/05/2015   Musculoskeletal pain 08/05/2015   Carpal tunnel syndrome (Bilateral) (L>R) 08/05/2015   Cervical facet syndrome (Bilateral) (L>R) 08/05/2015   History of TIA (transient ischemic attack) 06/12/2015   Cervical spondylosis (C5-6 & C6-7 DDD) 03/04/2015   Cervical (3 mm) Grade 1 Anterolisthesis of C4 over C5 03/04/2015   Chronic neck pain (3ry area of Pain) (Bilateral) (L>R) 03/04/2015   Chronic cervical radicular pain (Bilateral) (L>R) 03/04/2015   Cervical foraminal stenosis (multilevel) (Bilateral) 03/04/2015   Long term current use of opiate analgesic 02/26/2015   Encounter for therapeutic drug level monitoring 02/26/2015   Encounter for chronic pain management 02/26/2015   Numbness of upper extremity 02/26/2015   Radiculitis involving upper extremity 02/26/2015   Chronic low back pain (1ry area of Pain) (Bilateral) (L>R) 02/26/2015   Lumbar spondylosis (Bulging Disc & Severe Left Foraminal Stenosis at L3-4) 02/26/2015   Chronic lower extremity pain (2ry area of Pain) (Left) 02/26/2015  Lumbar foraminal stenosis (Severe Left L3-4) 02/26/2015   Lumbar facet syndrome (Bilateral) (L>R) 02/26/2015   Opiate use (10 MME/Day) 12/02/2014   Long term prescription opiate use 12/02/2014   IUD (intrauterine device) in place 10/01/2014   GERD (gastroesophageal reflux disease) 01/30/2014   Overweight(278.02) 02/14/2012   Hypertension 07/09/2011   HYPERTRIGLYCERIDEMIA 12/27/2008   GOITER, NONTOXIC MULTINODULAR 12/14/2006   Prediabetes 12/14/2006   Polycystic ovaries 12/14/2006   Metabolic Syndrome X 12/14/2006   OBESITY 12/14/2006   Allergic rhinitis 12/14/2006   Asthma 12/14/2006   Hirsutism 12/14/2006   Generalized anxiety disorder 12/14/2006          Past Medical  History:  Diagnosis Date   Anxiety     Asthma      ALLERGY INDUCED   Bulging lumbar disc (L3-4) 02/26/2015   Degenerative disc disease     Degenerative lumbar disc     Depression     Diabetes mellitus      TYPE 2   Elective abortion      ONE   GERD (gastroesophageal reflux disease)      takes prilosec    Hypertension     Stroke (HCC) 06/11/2015    patient describes as mini stroke   Vaginal delivery      ONE NSVD           Past Surgical History:  Procedure Laterality Date   CHOLECYSTECTOMY   2001   DILATATION & CURETTAGE/HYSTEROSCOPY WITH MYOSURE N/A 09/10/2014    Procedure: DILATATION & CURETTAGE/HYSTEROSCOPY WITH MYOSURE/INSERTION OF IUD;  Surgeon: Ok Edwards, MD;  Location: [facility];  Service: Gynecology;  Laterality: N/A;   lap band surgery       Lapband   04/2012   mirena        inserted 09-2014   WISDOM TOOTH EXTRACTION                Current Outpatient Medications  Medication Sig Dispense Refill   albuterol (VENTOLIN HFA) 108 (90 Base) MCG/ACT inhaler Inhale 2 puffs into the lungs every 6 (six) hours as needed for wheezing or shortness of breath. 18 g 0   atorvastatin (LIPITOR) 10 MG tablet TAKE 1 TABLET(10 MG) BY MOUTH DAILY FOR CHOLESTEROL 90 tablet 3   buPROPion (WELLBUTRIN XL) 300 MG 24 hr tablet Take 300 mg by mouth daily.       cetirizine (ZYRTEC) 10 MG tablet Take 10 mg by mouth at bedtime.       doxycycline (VIBRAMYCIN) 100 MG capsule Take 1 capsule (100 mg total) by mouth 2 (two) times daily. Take for one day for infection prevention. 2 capsule 0   fluticasone (FLONASE) 50 MCG/ACT nasal spray SHAKE LIQUID AND USE 1 SPRAY IN EACH NOSTRIL TWICE DAILY AS NEEDED FOR ALLERGIES OR RHINITIS 16 g 3   HYDROcodone-acetaminophen (NORCO/VICODIN) 5-325 MG tablet Take 0.5-1 tablets by mouth 2 (two) times daily as needed for severe pain. Must last 30 days 60 tablet 0   [START ON 02/06/2022] HYDROcodone-acetaminophen (NORCO/VICODIN) 5-325 MG tablet Take 0.5-1 tablets by  mouth 2 (two) times daily as needed for severe pain. Must last 30 days 60 tablet 0   ibuprofen (ADVIL) 200 MG tablet Take 400 mg by mouth every 8 (eight) hours as needed.       lamoTRIgine (LAMICTAL) 200 MG tablet Take 300 mg by mouth at bedtime.        LORazepam (ATIVAN) 1 MG tablet Take 0.5-1 mg by mouth every 8 (eight) hours as needed  for anxiety or sleep.        metaxalone (SKELAXIN) 800 MG tablet TAKE 1 TABLET(800 MG) BY MOUTH THREE TIMES DAILY AS NEEDED FOR MUSCLE SPASMS 90 tablet 0   metFORMIN (GLUCOPHAGE-XR) 500 MG 24 hr tablet TAKE 1 TABLET(500 MG) BY MOUTH DAILY WITH BREAKFAST 90 tablet 3   misoprostol (CYTOTEC) 200 MCG tablet Pace 1 tablet vaginal the night before procedure and place 1 tablet vaginal the morning of the procedure. 2 tablet 0   montelukast (SINGULAIR) 10 MG tablet Take 1 tablet (10 mg total) by mouth at bedtime. For allergies. 90 tablet 3   naloxone (NARCAN) nasal spray 4 mg/0.1 mL Place 1 spray into the nose as needed for up to 365 doses (for opioid-induced respiratory depresssion). In case of emergency (overdose), spray once into each nostril. If no response within 3 minutes, repeat application and call 911. 1 each 0   PARoxetine (PAXIL-CR) 37.5 MG 24 hr tablet Take 2 tablets by mouth daily.       RA ASPIRIN EC ADULT LOW ST 81 MG EC tablet take 1 tablet by mouth once daily 30 tablet 0   spironolactone (ALDACTONE) 25 MG tablet TAKE 1 TABLET(25 MG) BY MOUTH DAILY 90 tablet 3   VYVANSE 40 MG capsule Take 40 mg by mouth daily.   0   HYDROcodone-acetaminophen (NORCO/VICODIN) 5-325 MG tablet Take 0.5-1 tablets by mouth 2 (two) times daily as needed for severe pain. Must last 30 days 60 tablet 0    No current facility-administered medications for this visit.      ALLERGIES: Oxycodone, Penicillins, Red dye, and Sulfa antibiotics        Family History  Problem Relation Age of Onset   Cancer Mother          thyroid cancer   Hypertension Mother     Hypertension Father      Diabetes Father     Asthma Maternal Grandmother     Stroke Maternal Grandmother     Stroke Maternal Grandfather        Social History         Socioeconomic History   Marital status: Married      Spouse name: Not on file   Number of children: Not on file   Years of education: Not on file   Highest education level: Not on file  Occupational History   Not on file  Tobacco Use   Smoking status: Never   Smokeless tobacco: Never  Vaping Use   Vaping Use: Never used  Substance and Sexual Activity   Alcohol use: Never      Alcohol/week: 0.0 standard drinks of alcohol   Drug use: No   Sexual activity: Yes      Birth control/protection: I.U.D.      Comment: 1st intercourse 56 yo-Fewer than 5 partners-Mirena  inserted 09-2014  Other Topics Concern   Not on file  Social History Narrative   Not on file    Social Determinants of Health    Financial Resource Strain: Not on file  Food Insecurity: Not on file  Transportation Needs: Not on file  Physical Activity: Not on file  Stress: Not on file  Social Connections: Not on file  Intimate Partner Violence: Not on file      Review of Systems  All other systems reviewed and are negative.     PHYSICAL EXAMINATION:     BP 134/80 (BP Location: Right Arm, Patient Position: Sitting, Cuff Size: Large)   Ht  5\' 3"  (1.6 m)   Wt 232 lb (105.2 kg)   BMI 41.10 kg/m     General appearance: alert, cooperative and appears stated age Head: Normocephalic, without obvious abnormality, atraumatic Neck: no adenopathy, supple, symmetrical, trachea midline and thyroid normal to inspection and palpation Lungs: clear to auscultation bilaterally Heart: regular rate and rhythm   Pelvic: deferred.   Pelvic US  Uterus 5.82 x 4.49 x 2.59 cm.  IUD in endometrial canal.  Left ovary 2.36 x 1.4 cm.  Right ovary 2.51 x 1.58 cm.  No adnexal masses.  No free fluid.    ASSESSMENT   Retained IUD.    PLAN   Proceed with hysteroscopy with IUD  removal. Risks and benefits reviewed. Risks include but are not limited to bleeding, infection, damage to surrounding organs including uterine perforation requiring hospitalization and laparoscopy, reaction to anesthesia, DVT, PE, death, need for further treatment and surgery. Surgical expectations and recovery discussed.  Patient wishes to proceed. Rx for Cytotec 200 mcg pv the night prior to procedure and then 200 mcg pv the am of the procedure.    An After Visit Summary was printed and given to the patient.   27 min  total time was spent for this patient encounter, including preparation, face-to-face counseling with the patient, coordination of care, and documentation of the encounter.             Electronically signed by Debra SallesAmundson C Silva, Terrianna Holsclaw E, MD at 01/27/2022  8:00 PM

## 2022-02-10 NOTE — Progress Notes (Signed)
Spoke w/ via phone for pre-op interview--- pt Lab needs dos----  istat, EKG, urine preg             Lab results------ no COVID test -----patient states asymptomatic no test needed Arrive at ------- 0915 on 02-11-2022 NPO after MN NO Solid Food.  Clear liquids from MN until--- 0815 Med rec completed Medications to take morning of surgery ----- wellbutrin, paxil, pepcid, lipitor Diabetic medication ----- do not metformin morning of surgery Patient instructed no nail polish to be worn day of surgery Patient instructed to bring photo id and insurance card day of surgery Patient aware to have Driver (ride ) / caregiver    for 24 hours after surgery --husband, mike Patient Special Instructions ----- asked to bring rescue inhaler dos Pre-Op special Istructions ----- sent inbox message to dr Edward Jolly in epic, requested orders Patient verbalized understanding of instructions that were given at this phone interview. Patient denies shortness of breath, chest pain, fever, cough at this phone interview.

## 2022-02-11 ENCOUNTER — Ambulatory Visit (HOSPITAL_BASED_OUTPATIENT_CLINIC_OR_DEPARTMENT_OTHER): Payer: Managed Care, Other (non HMO) | Admitting: Anesthesiology

## 2022-02-11 ENCOUNTER — Ambulatory Visit (HOSPITAL_BASED_OUTPATIENT_CLINIC_OR_DEPARTMENT_OTHER)
Admission: RE | Admit: 2022-02-11 | Discharge: 2022-02-11 | Disposition: A | Discharge status: Home / Self Care | Payer: Managed Care, Other (non HMO) | Attending: Obstetrics and Gynecology | Admitting: Obstetrics and Gynecology

## 2022-02-11 ENCOUNTER — Other Ambulatory Visit: Payer: Self-pay | Admitting: Primary Care

## 2022-02-11 ENCOUNTER — Encounter (HOSPITAL_BASED_OUTPATIENT_CLINIC_OR_DEPARTMENT_OTHER)
Admission: RE | Disposition: A | Discharge status: Home / Self Care | Payer: Self-pay | Source: Home / Self Care | Attending: Obstetrics and Gynecology

## 2022-02-11 ENCOUNTER — Other Ambulatory Visit: Payer: Self-pay

## 2022-02-11 ENCOUNTER — Encounter (HOSPITAL_BASED_OUTPATIENT_CLINIC_OR_DEPARTMENT_OTHER): Payer: Self-pay | Admitting: Obstetrics and Gynecology

## 2022-02-11 DIAGNOSIS — N879 Dysplasia of cervix uteri, unspecified: Secondary | ICD-10-CM | POA: Diagnosis not present

## 2022-02-11 DIAGNOSIS — N840 Polyp of corpus uteri: Secondary | ICD-10-CM | POA: Insufficient documentation

## 2022-02-11 DIAGNOSIS — J45909 Unspecified asthma, uncomplicated: Secondary | ICD-10-CM | POA: Diagnosis not present

## 2022-02-11 DIAGNOSIS — K219 Gastro-esophageal reflux disease without esophagitis: Secondary | ICD-10-CM | POA: Insufficient documentation

## 2022-02-11 DIAGNOSIS — I10 Essential (primary) hypertension: Secondary | ICD-10-CM | POA: Diagnosis not present

## 2022-02-11 DIAGNOSIS — Z30432 Encounter for removal of intrauterine contraceptive device: Secondary | ICD-10-CM

## 2022-02-11 DIAGNOSIS — E119 Type 2 diabetes mellitus without complications: Secondary | ICD-10-CM | POA: Diagnosis not present

## 2022-02-11 DIAGNOSIS — F32A Depression, unspecified: Secondary | ICD-10-CM | POA: Insufficient documentation

## 2022-02-11 DIAGNOSIS — T8339XA Other mechanical complication of intrauterine contraceptive device, initial encounter: Secondary | ICD-10-CM | POA: Diagnosis not present

## 2022-02-11 DIAGNOSIS — Z6841 Body Mass Index (BMI) 40.0 and over, adult: Secondary | ICD-10-CM

## 2022-02-11 DIAGNOSIS — Z01818 Encounter for other preprocedural examination: Secondary | ICD-10-CM

## 2022-02-11 DIAGNOSIS — Z79899 Other long term (current) drug therapy: Secondary | ICD-10-CM | POA: Insufficient documentation

## 2022-02-11 DIAGNOSIS — F419 Anxiety disorder, unspecified: Secondary | ICD-10-CM | POA: Diagnosis not present

## 2022-02-11 DIAGNOSIS — J309 Allergic rhinitis, unspecified: Secondary | ICD-10-CM

## 2022-02-11 HISTORY — DX: Other intervertebral disc degeneration, lumbar region: M51.36

## 2022-02-11 HISTORY — DX: Mild intermittent asthma, uncomplicated: J45.20

## 2022-02-11 HISTORY — DX: Polycystic ovarian syndrome: E28.2

## 2022-02-11 HISTORY — PX: IUD REMOVAL: SHX5392

## 2022-02-11 HISTORY — DX: Major depressive disorder, single episode, unspecified: F32.9

## 2022-02-11 HISTORY — DX: Presence of spectacles and contact lenses: Z97.3

## 2022-02-11 HISTORY — DX: Type 2 diabetes mellitus without complications: E11.9

## 2022-02-11 HISTORY — DX: Prediabetes: R73.03

## 2022-02-11 HISTORY — DX: Other cervical disc degeneration, unspecified cervical region: M50.30

## 2022-02-11 HISTORY — DX: Other mechanical complication of intrauterine contraceptive device, initial encounter: T83.39XA

## 2022-02-11 HISTORY — PX: HYSTEROSCOPY: SHX211

## 2022-02-11 HISTORY — DX: Other seasonal allergic rhinitis: J30.2

## 2022-02-11 HISTORY — DX: Chronic pain syndrome: G89.4

## 2022-02-11 HISTORY — DX: Other intervertebral disc degeneration, lumbar region without mention of lumbar back pain or lower extremity pain: M51.369

## 2022-02-11 HISTORY — DX: Generalized anxiety disorder: F41.1

## 2022-02-11 LAB — CBC
HCT: 39.3 % (ref 36.0–46.0)
Hemoglobin: 13 g/dL (ref 12.0–15.0)
MCH: 29.4 pg (ref 26.0–34.0)
MCHC: 33.1 g/dL (ref 30.0–36.0)
MCV: 88.9 fL (ref 80.0–100.0)
Platelets: 226 10*3/uL (ref 150–400)
RBC: 4.42 MIL/uL (ref 3.87–5.11)
RDW: 12.4 % (ref 11.5–15.5)
WBC: 8.2 10*3/uL (ref 4.0–10.5)
nRBC: 0 % (ref 0.0–0.2)

## 2022-02-11 LAB — BASIC METABOLIC PANEL
Anion gap: 8 (ref 5–15)
BUN: 19 mg/dL (ref 6–20)
CO2: 24 mmol/L (ref 22–32)
Calcium: 9.2 mg/dL (ref 8.9–10.3)
Chloride: 105 mmol/L (ref 98–111)
Creatinine, Ser: 0.9 mg/dL (ref 0.44–1.00)
GFR, Estimated: >60 mL/min (ref >60)
Glucose, Bld: 110 mg/dL — ABNORMAL HIGH (ref 70–99)
Potassium: 4.4 mmol/L (ref 3.5–5.1)
Sodium: 137 mmol/L (ref 135–145)

## 2022-02-11 LAB — POCT PREGNANCY, URINE: Preg Test, Ur: NEGATIVE

## 2022-02-11 SURGERY — HYSTEROSCOPY
Anesthesia: General | Site: Vagina

## 2022-02-11 MED ORDER — KETOROLAC TROMETHAMINE 30 MG/ML IJ SOLN
INTRAMUSCULAR | Status: DC | PRN
  Administered 2022-02-11: 30 mg via INTRAVENOUS

## 2022-02-11 MED ORDER — LIDOCAINE 2% (20 MG/ML) 5 ML SYRINGE
INTRAMUSCULAR | Status: DC | PRN
  Administered 2022-02-11: 100 mg via INTRAVENOUS

## 2022-02-11 MED ORDER — LIDOCAINE HCL (PF) 2 % IJ SOLN
INTRAMUSCULAR | Status: AC
  Filled 2022-02-11: qty 5

## 2022-02-11 MED ORDER — PROPOFOL 10 MG/ML IV BOLUS
INTRAVENOUS | Status: DC | PRN
  Administered 2022-02-11: 200 mg via INTRAVENOUS
  Administered 2022-02-11: 20 mg via INTRAVENOUS
  Administered 2022-02-11: 40 mg via INTRAVENOUS

## 2022-02-11 MED ORDER — KETOROLAC TROMETHAMINE 30 MG/ML IJ SOLN
INTRAMUSCULAR | Status: AC
  Filled 2022-02-11: qty 1

## 2022-02-11 MED ORDER — SCOPOLAMINE 1 MG/3DAYS TD PT72
1.0000 | MEDICATED_PATCH | TRANSDERMAL | Status: DC
  Administered 2022-02-11: 1.5 mg via TRANSDERMAL

## 2022-02-11 MED ORDER — AMISULPRIDE (ANTIEMETIC) 5 MG/2ML IV SOLN: 10.0000 mg | Freq: Once | INTRAVENOUS | Status: DC | PRN

## 2022-02-11 MED ORDER — CLOTRIMAZOLE-BETAMETHASONE 1-0.05 % EX CREA: TOPICAL_CREAM | CUTANEOUS | 0 refills | Status: DC

## 2022-02-11 MED ORDER — MIDAZOLAM HCL 5 MG/5ML IJ SOLN
INTRAMUSCULAR | Status: DC | PRN
  Administered 2022-02-11: 2 mg via INTRAVENOUS

## 2022-02-11 MED ORDER — LACTATED RINGERS IV SOLN: INTRAVENOUS | Status: DC

## 2022-02-11 MED ORDER — MIDAZOLAM HCL 2 MG/2ML IJ SOLN
INTRAMUSCULAR | Status: AC
  Filled 2022-02-11: qty 2

## 2022-02-11 MED ORDER — PHENYLEPHRINE 80 MCG/ML (10ML) SYRINGE FOR IV PUSH (FOR BLOOD PRESSURE SUPPORT)
PREFILLED_SYRINGE | INTRAVENOUS | Status: AC
  Filled 2022-02-11: qty 10

## 2022-02-11 MED ORDER — FENTANYL CITRATE (PF) 100 MCG/2ML IJ SOLN
INTRAMUSCULAR | Status: DC | PRN
  Administered 2022-02-11: 100 ug via INTRAVENOUS

## 2022-02-11 MED ORDER — LIDOCAINE HCL 1 % IJ SOLN
INTRAMUSCULAR | Status: DC | PRN
  Administered 2022-02-11: 10 mL

## 2022-02-11 MED ORDER — ONDANSETRON HCL 4 MG/2ML IJ SOLN
INTRAMUSCULAR | Status: AC
  Filled 2022-02-11: qty 2

## 2022-02-11 MED ORDER — PHENYLEPHRINE 80 MCG/ML (10ML) SYRINGE FOR IV PUSH (FOR BLOOD PRESSURE SUPPORT)
PREFILLED_SYRINGE | INTRAVENOUS | Status: DC | PRN
  Administered 2022-02-11: 80 ug via INTRAVENOUS

## 2022-02-11 MED ORDER — ACETAMINOPHEN 500 MG PO TABS
ORAL_TABLET | ORAL | Status: AC
  Filled 2022-02-11: qty 2

## 2022-02-11 MED ORDER — SODIUM CHLORIDE 0.9 % IR SOLN
Status: DC | PRN
  Administered 2022-02-11: 3000 mL

## 2022-02-11 MED ORDER — POVIDONE-IODINE 10 % EX SWAB: 2.0000 | Freq: Once | CUTANEOUS | Status: DC

## 2022-02-11 MED ORDER — ONDANSETRON HCL 4 MG/2ML IJ SOLN
INTRAMUSCULAR | Status: DC | PRN
  Administered 2022-02-11: 4 mg via INTRAVENOUS

## 2022-02-11 MED ORDER — DEXAMETHASONE SODIUM PHOSPHATE 10 MG/ML IJ SOLN
INTRAMUSCULAR | Status: DC | PRN
  Administered 2022-02-11: 5 mg via INTRAVENOUS

## 2022-02-11 MED ORDER — IBUPROFEN 800 MG PO TABS: 800.0000 mg | ORAL_TABLET | Freq: Three times a day (TID) | ORAL | 0 refills | Status: DC | PRN

## 2022-02-11 MED ORDER — DEXAMETHASONE SODIUM PHOSPHATE 10 MG/ML IJ SOLN
INTRAMUSCULAR | Status: AC
  Filled 2022-02-11: qty 1

## 2022-02-11 MED ORDER — SCOPOLAMINE 1 MG/3DAYS TD PT72
MEDICATED_PATCH | TRANSDERMAL | Status: AC
  Filled 2022-02-11: qty 1

## 2022-02-11 MED ORDER — ACETAMINOPHEN 500 MG PO TABS
1000.0000 mg | ORAL_TABLET | Freq: Once | ORAL | Status: AC
  Administered 2022-02-11: 1000 mg via ORAL

## 2022-02-11 MED ORDER — PROMETHAZINE HCL 25 MG/ML IJ SOLN: 6.2500 mg | INTRAMUSCULAR | Status: DC | PRN

## 2022-02-11 MED ORDER — FENTANYL CITRATE (PF) 100 MCG/2ML IJ SOLN
INTRAMUSCULAR | Status: AC
  Filled 2022-02-11: qty 2

## 2022-02-11 MED ORDER — FENTANYL CITRATE (PF) 100 MCG/2ML IJ SOLN: 25.0000 ug | INTRAMUSCULAR | Status: DC | PRN

## 2022-02-11 SURGICAL SUPPLY — 19 items
CATH ROBINSON RED A/P 16FR (CATHETERS) ×1 IMPLANT
DEVICE MYOSURE REACH (MISCELLANEOUS) IMPLANT
DRSG TELFA 3X8 NADH STRL (GAUZE/BANDAGES/DRESSINGS) ×1 IMPLANT
GAUZE 4X4 16PLY ~~LOC~~+RFID DBL (SPONGE) ×2 IMPLANT
GLOVE BIO SURGEON STRL SZ 6.5 (GLOVE) ×1 IMPLANT
GLOVE BIOGEL PI IND STRL 7.0 (GLOVE) IMPLANT
GOWN STRL REUS W/ TWL LRG LVL3 (GOWN DISPOSABLE) IMPLANT
GOWN STRL REUS W/TWL LRG LVL3 (GOWN DISPOSABLE) ×2 IMPLANT
IV NS IRRIG 3000ML ARTHROMATIC (IV SOLUTION) ×1 IMPLANT
KIT PROCEDURE FLUENT (KITS) ×1 IMPLANT
KIT TURNOVER CYSTO (KITS) ×1 IMPLANT
NDL HYPO 18GX1.5 BLUNT FILL (NEEDLE) IMPLANT
NEEDLE HYPO 18GX1.5 BLUNT FILL (NEEDLE) ×1 IMPLANT
PACK VAGINAL MINOR WOMEN LF (CUSTOM PROCEDURE TRAY) ×1 IMPLANT
PAD OB MATERNITY 4.3X12.25 (PERSONAL CARE ITEMS) ×1 IMPLANT
SEAL ROD LENS SCOPE MYOSURE (ABLATOR) ×1 IMPLANT
SOL PREP POV-IOD 16OZ 10% (MISCELLANEOUS) IMPLANT
SYR 20ML LL LF (SYRINGE) IMPLANT
TOWEL OR 17X26 10 PK STRL BLUE (TOWEL DISPOSABLE) ×1 IMPLANT

## 2022-02-11 NOTE — Transfer of Care (Signed)
Immediate Anesthesia Transfer of Care Note  Patient: BELICIA DIFATTA  Procedure(s) Performed: HYSTEROSCOPY WITH MYOSURE RESECTION OF ENDOMETRIAL POLYP, DILATION AND CURETTAGE (Vagina ) INTRAUTERINE DEVICE (IUD) REMOVAL (Vagina )  Patient Location: PACU  Anesthesia Type:General  Level of Consciousness: awake, alert , oriented, and drowsy  Airway & Oxygen Therapy: Patient Spontanous Breathing and Patient connected to face mask oxygen  Post-op Assessment: Report given to RN and Post -op Vital signs reviewed and stable  Post vital signs: Reviewed and stable  Last Vitals:  Vitals Value Taken Time  BP    Temp    Pulse 85 02/11/22 1206  Resp 17 02/11/22 1206  SpO2 97 % 02/11/22 1206  Vitals shown include unvalidated device data.  Last Pain:  Vitals:   02/11/22 0943  TempSrc: Oral  PainSc: 1       Patients Stated Pain Goal: 5 (02/11/22 0943)  Complications: No notable events documented.

## 2022-02-11 NOTE — Anesthesia Preprocedure Evaluation (Addendum)
Anesthesia Evaluation  Patient identified by MRN, date of birth, ID band Patient awake    Reviewed: Allergy & Precautions, NPO status , Patient's Chart, lab work & pertinent test results  History of Anesthesia Complications Negative for: history of anesthetic complications  Airway Mallampati: II  TM Distance: >3 FB     Dental no notable dental hx. (+) Dental Advisory Given   Pulmonary asthma    Pulmonary exam normal        Cardiovascular hypertension, Normal cardiovascular exam  Normal echo 2017   Neuro/Psych  PSYCHIATRIC DISORDERS Anxiety Depression    TIA   GI/Hepatic Neg liver ROS,GERD  Medicated,,  Endo/Other  diabetes  Morbid obesity  Renal/GU negative Renal ROS     Musculoskeletal negative musculoskeletal ROS (+)    Abdominal   Peds  Hematology negative hematology ROS (+)   Anesthesia Other Findings   Reproductive/Obstetrics PCOS                             Anesthesia Physical Anesthesia Plan  ASA: 3  Anesthesia Plan: General   Post-op Pain Management: Tylenol PO (pre-op)* and Toradol IV (intra-op)*   Induction: Intravenous  PONV Risk Score and Plan: 4 or greater and Ondansetron, Dexamethasone, Midazolam and Scopolamine patch - Pre-op  Airway Management Planned: LMA  Additional Equipment:   Intra-op Plan:   Post-operative Plan: Extubation in OR  Informed Consent: I have reviewed the patients History and Physical, chart, labs and discussed the procedure including the risks, benefits and alternatives for the proposed anesthesia with the patient or authorized representative who has indicated his/her understanding and acceptance.     Dental advisory given  Plan Discussed with: Anesthesiologist and CRNA  Anesthesia Plan Comments:        Anesthesia Quick Evaluation

## 2022-02-11 NOTE — Anesthesia Postprocedure Evaluation (Signed)
Anesthesia Post Note  Patient: Debra Myers  Procedure(s) Performed: HYSTEROSCOPY WITH MYOSURE RESECTION OF ENDOMETRIAL POLYP, DILATION AND CURETTAGE (Vagina ) INTRAUTERINE DEVICE (IUD) REMOVAL (Vagina )     Anesthesia Type: General Anesthetic complications: no   No notable events documented.  Last Vitals:  Vitals:   02/11/22 1203 02/11/22 1238  BP: 116/68   Pulse: 86 90  Resp: 17 17  Temp: 36.7 C   SpO2: 97% 98%    Last Pain:  Vitals:   02/11/22 1238  TempSrc:   PainSc: 0-No pain                 Jkai Arwood DANIEL

## 2022-02-11 NOTE — Progress Notes (Signed)
Update to History and Physical  States she developed a rash on her lower abdomen after her transabdominal US done for localization of the IUD 01/26/22.  She used Monistat, and the rash persists.   She used Cytotec in the vaginal for cervical   Vitals:   02/11/22 0943  BP: (!) 157/88  Pulse: 91  Resp: 18  Temp: 98 F (36.7 C)  SpO2: 98%    Glucose 110.  UPT negative.   Will examine rash in the OR.   OK to proceed with surgery, hysteroscopic removal of retained IUD.

## 2022-02-11 NOTE — Anesthesia Procedure Notes (Signed)
Procedure Name: LMA Insertion Date/Time: 02/11/2022 11:23 AM  Performed by: Bishop Limbo, CRNAPre-anesthesia Checklist: Patient identified, Emergency Drugs available, Suction available and Patient being monitored Patient Re-evaluated:Patient Re-evaluated prior to induction Oxygen Delivery Method: Circle System Utilized Preoxygenation: Pre-oxygenation with 100% oxygen Induction Type: IV induction Ventilation: Mask ventilation without difficulty LMA: LMA inserted LMA Size: 4.0 Number of attempts: 1 Airway Equipment and Method: Bite block Placement Confirmation: positive ETCO2 Tube secured with: Tape Dental Injury: Teeth and Oropharynx as per pre-operative assessment

## 2022-02-11 NOTE — Discharge Instructions (Addendum)
Hi Debra Myers,   I was able to remove your IUD with the assistance of the hysteroscope.   I found a polyp inside your uterus today, and I did remove it and send it for biopsy.  I also did a curettage of the walls of the uterus and sent that for biopsy as well.  This is standard of care when polyps are identified in postmenopausal women.   The biopsy results should be ready in about 2 - 3 days.  I am not sure if it will take longer due to the upcoming Holiday.   The rash on your lower abdomen looks like yeast infection.  I will send a prescription to your pharmacy for Lotrisone cream.  This has steroid and antifungal treatment in it.  You can apply this to your skin twice a day for one week.   Everything went well today!  Conley Simmonds, MD  Post Anesthesia Home Care Instructions  Activity: Get plenty of rest for the remainder of the day. A responsible adult should stay with you for 24 hours following the procedure.  For the next 24 hours, DO NOT: -Drive a car -Advertising copywriter -Drink alcoholic beverages -Take any medication unless instructed by your physician -Make any legal decisions or sign important papers.  Meals: Start with liquid foods such as gelatin or soup. Progress to regular foods as tolerated. Avoid greasy, spicy, heavy foods. If nausea and/or vomiting occur, drink only clear liquids until the nausea and/or vomiting subsides. Call your physician if vomiting continues.  Special Instructions/Symptoms: Your throat may feel dry or sore from the anesthesia or the breathing tube placed in your throat during surgery. If this causes discomfort, gargle with warm salt water. The discomfort should disappear within 24 hours.  If you had a scopolamine patch placed behind your ear for the management of post- operative nausea and/or vomiting:  1. The medication in the patch is effective for 72 hours, after which it should be removed.  Wrap patch in a tissue and discard in the trash. Wash  hands thoroughly with soap and water. 2. You may remove the patch earlier than 72 hours if you experience unpleasant side effects which may include dry mouth, dizziness or visual disturbances. 3. Avoid touching the patch. Wash your hands with soap and water after contact with the patch.  DISCHARGE INSTRUCTIONS: HYSTEROSCOPY / ENDOMETRIAL ABLATION The following instructions have been prepared to help you care for yourself upon your return home.  May Remove Scop patch on or before 12/23   May take Ibuprofen after 6pm tonight  DO NOT TAKE TYLENOL UNTIL AFTER 4pm today.   May take stool softner while taking narcotic pain medication to prevent constipation.  Drink plenty of water.  Personal hygiene:  Use sanitary pads for vaginal drainage, not tampons.  Shower the day after your procedure.  NO tub baths, pools or Jacuzzis for 2-3 weeks.  Wipe front to back after using the bathroom.  Activity and limitations:  Do NOT drive or operate any equipment for 24 hours. The effects of anesthesia are still present and drowsiness may result.  Do NOT rest in bed all day.  Walking is encouraged.  Walk up and down stairs slowly.  You may resume your normal activity in one to two days or as indicated by your physician. Sexual activity: NO intercourse for at least 2 weeks after the procedure, or as indicated by your Doctor.  Diet: Eat a light meal as desired this evening. You may resume your  usual diet tomorrow.  Return to Work: You may resume your work activities in one to two days or as indicated by Therapist, sports.  What to expect after your surgery: Expect to have vaginal bleeding/discharge for 2-3 days and spotting for up to 10 days. It is not unusual to have soreness for up to 1-2 weeks. You may have a slight burning sensation when you urinate for the first day. Mild cramps may continue for a couple of days. You may have a regular period in 2-6 weeks.  Call your doctor for any of the following:   Excessive vaginal bleeding or clotting, saturating and changing one pad every hour.  Inability to urinate 6 hours after discharge from hospital.  Pain not relieved by pain medication.  Fever of 100.4 F or greater.  Unusual vaginal discharge or odor.

## 2022-02-11 NOTE — Op Note (Signed)
OPERATIVE REPORT  PREOPERATIVE DIAGNOSES:   Retained IUD.   POSTOPERATIVE DIAGNOSES:   Retained IUD, endometrial polyp.  PROCEDURE:  Hysteroscopy with IUD removal, Myosure resection of endometrial polyp, dilation and curettage.  SURGEON:  Randye Lobo, MD  ANESTHESIA:  LMA, paracervical block with 10 mL of 1% lidocaine.  IV FLUIDS:   700 cc LR  EBL:   5 cc  URINE OUTPUT:   50 cc.   NORMAL SALINE DEFICIT:   260 cc  COMPLICATIONS:  None.  INDICATIONS FOR THE PROCEDURE:     The patient is a 56 year old G4P1011 Caucasian postmenopausal female who presents with a retained Mirena IUD.  Office attempt to remove the IUD was not successful.  The strings and the distal ring of the IUD snapped off with office attempt.  Pelvic ultrasound showed the IUD in a normal position in the uterine cavity.   Vaginal Cytotec, cervical dilation and Ultrasound guided IUD removal in the office was not successful.  A plan is now made to proceed with a hysteroscopy after risks, benefits and alternatives were reviewed.  FINDINGS:  Exam under anesthesia revealed a small anteverted, mobile uterus.  No adnexal masses were noted.  The uterus was sounded to 6.5 cm. Hysteroscopy showed the IUD present in the endometrial canal.  The tubal ostia regions were normal.  There was a 5 mm polyp along the right fundal wall.   Endometrial currettings were scant.  SPECIMENS:  The endometrial polyp and the endometrial curettings were sent to Pathology separately.   PROCEDURE IN DETAIL:  The patient was reidentified in the preoperative hold area.  She received TED hose and PAS stockings for DVT prophylaxis.  In the operating room, the patient was placed in the dorsal lithotomy position and then an LMA anesthetic was introduced.    An exam under anesthesia was performed.  Cytotec was removed from the vagina.   The patient's lower abdomen, vagina and perineum were sterilely prepped with Betadine and the  patient's  bladder was catheterized of urine.  She was sterilly draped.  A speculum was placed inside the vagina and a single-tooth tenaculum was placed on the anterior cervical lip.   A paracervical block was performed with a total of 10 mL of 1% lidocaine plain.   The uterus was sounded. The cervix was dilated to a #19 Pratt dilator, and the MyoSure hysteroscope was then inserted inside the uterine cavity under the continuous infusion of normal saline solution.   The IUD stem was noted.  The grasper was placed through the Myosure channel, and the IUD was not removable.  The cervix was further dilated to a #23 Pratt dilator.  A dressing forceps was passed through the cervix and into the uterine cavity, and the IUD was successfully grasped and removed.  A picture was taken, and it was discarded. The Myosure hysteroscope was reinserted, and a polyp was noted along the right uterine sidewall.  The Reach Myosure device was used to resect the endometrial polyp without difficulty.  It was sent to Pathology.    The serrated curette was introduced into the uterine cavity and the endometrium was curetted in all 4 quadrants.   A minimal amount of endometrial curettings was obtained.  This specimen was sent to Pathology separately.   The single-tooth tenaculum which had been placed on the anterior cervical lip was removed.     Hemostasis was good, and all of the vaginal instruments were removed.  The patient was awakened and  escorted to the recovery room in stable condition after she was cleansed of Betadine.  There were no complications to the procedure.   All needle, instrument and sponge counts were correct.  Randye Lobo, MD

## 2022-02-12 ENCOUNTER — Encounter (HOSPITAL_BASED_OUTPATIENT_CLINIC_OR_DEPARTMENT_OTHER): Payer: Self-pay | Admitting: Obstetrics and Gynecology

## 2022-02-12 LAB — SURGICAL PATHOLOGY

## 2022-02-17 NOTE — Progress Notes (Deleted)
GYNECOLOGY  VISIT   HPI: 56 y.o.   Married  Caucasian  female   [name] with No LMP recorded. (Menstrual status: IUD).   here for   post op  GYNECOLOGIC HISTORY: No LMP recorded. (Menstrual status: IUD). Contraception:  n/a Menopausal hormone therapy:  n/a Last mammogram:  07/01/21 Breast Composition Category B, BI-RADS CATEGORY 1 Negative Last pap smear:   03/01/18 negative: HR HPV negative, 05/18/13 negative: HR HPV negative         OB History     Gravida  2   Para  1   Term  1   Preterm      AB  1   Living  1      SAB  1   IAB      Ectopic      Multiple      Live Births  1              Patient Active Problem List   Diagnosis Date Noted   Preventative health care 12/29/2021   History of bariatric surgery 01/21/2021   Chronic use of opiate for therapeutic purpose 07/27/2020   Acute exacerbation of chronic low back pain 12/24/2019   Binge eating disorder 11/23/2019   Uncomplicated opioid dependence (HCC) 11/07/2019   Pharmacologic therapy 08/07/2019   Disorder of skeletal system 08/07/2019   Problems influencing health status 08/07/2019   Osteoarthritis involving multiple joints 07/20/2018   Chronic pain of left knee 04/18/2018   Pain in right knee 01/09/2018   Pain in left knee 01/09/2018   DDD (degenerative disc disease), lumbar 04/11/2017   DDD (degenerative disc disease), cervical 04/11/2017   Well woman exam 09/15/2016   Chronic pain syndrome 01/30/2016   Morbid obesity with BMI of 40.0-44.9, adult (HCC) 01/30/2016   Muscle spasm, nocturnal 08/05/2015   Musculoskeletal pain 08/05/2015   Carpal tunnel syndrome (Bilateral) (L>R) 08/05/2015   Cervical facet syndrome (Bilateral) (L>R) 08/05/2015   History of TIA (transient ischemic attack) 06/12/2015   Cervical spondylosis (C5-6 & C6-7 DDD) 03/04/2015   Cervical (3 mm) Grade 1 Anterolisthesis of C4 over C5 03/04/2015   Chronic neck pain (3ry area of Pain) (Bilateral) (L>R) 03/04/2015   Chronic  cervical radicular pain (Bilateral) (L>R) 03/04/2015   Cervical foraminal stenosis (multilevel) (Bilateral) 03/04/2015   Long term current use of opiate analgesic 02/26/2015   Encounter for therapeutic drug level monitoring 02/26/2015   Encounter for chronic pain management 02/26/2015   Numbness of upper extremity 02/26/2015   Radiculitis involving upper extremity 02/26/2015   Chronic low back pain (1ry area of Pain) (Bilateral) (L>R) 02/26/2015   Lumbar spondylosis (Bulging Disc & Severe Left Foraminal Stenosis at L3-4) 02/26/2015   Chronic lower extremity pain (2ry area of Pain) (Left) 02/26/2015   Lumbar foraminal stenosis (Severe Left L3-4) 02/26/2015   Lumbar facet syndrome (Bilateral) (L>R) 02/26/2015   Opiate use (10 MME/Day) 12/02/2014   Long term prescription opiate use 12/02/2014   IUD (intrauterine device) in place 10/01/2014   GERD (gastroesophageal reflux disease) 01/30/2014   Overweight(278.02) 02/14/2012   Hypertension 07/09/2011   HYPERTRIGLYCERIDEMIA 12/27/2008   GOITER, NONTOXIC MULTINODULAR 12/14/2006   Prediabetes 12/14/2006   Polycystic ovaries 12/14/2006   Metabolic Syndrome X 12/14/2006   OBESITY 12/14/2006   Allergic rhinitis 12/14/2006   Asthma 12/14/2006   Hirsutism 12/14/2006   Generalized anxiety disorder 12/14/2006    Past Medical History:  Diagnosis Date   Bulging lumbar disc (L3-4) 02/26/2015   Chronic pain syndrome  followed by pain clinic;    back, neck, lower extremities   DDD (degenerative disc disease), cervical    DDD (degenerative disc disease), lumbar    GAD (generalized anxiety disorder)    GERD (gastroesophageal reflux disease)    History of transient ischemic attack (TIA) 06/11/2015   admission in epic--  (02-10-2022 per pt no resiudal's and no s&s since)   Hypertension    MDD (major depressive disorder)    Mild intermittent asthma    followed  by pcp   PCOS (polycystic ovarian syndrome)    Pre-diabetes    Retained  intrauterine contraceptive device (IUD)    Seasonal allergic rhinitis    Wears contact lenses     Past Surgical History:  Procedure Laterality Date   CHOLECYSTECTOMY, LAPAROSCOPIC  11/22/1999   @[facility]  by dr d. Magnus Ivanblackman   DILATATION & CURETTAGE/HYSTEROSCOPY WITH MYOSURE N/A 09/10/2014   Procedure: DILATATION & CURETTAGE/HYSTEROSCOPY WITH MYOSURE/INSERTION OF IUD;  Surgeon: Ok EdwardsJuan H Fernandez, MD;  Location: [facility];  Service: Gynecology;  Laterality: N/A;   HYSTEROSCOPY N/A 02/11/2022   Procedure: HYSTEROSCOPY WITH MYOSURE RESECTION OF ENDOMETRIAL POLYP, DILATION AND CURETTAGE;  Surgeon: Patton SallesAmundson C Silva, Brook E, MD;  [facility] [place];  Service: Gynecology;  Laterality: N/A;   IUD REMOVAL N/A 02/11/2022   Procedure: INTRAUTERINE DEVICE (IUD) REMOVAL;  Surgeon: Patton SallesAmundson C Silva, Brook E, MD;  [facility] [place];  Service: Gynecology;  Laterality: N/A;   LAPAROSCOPIC GASTRIC BANDING  04/2012   WISDOM TOOTH EXTRACTION      Current Outpatient Medications  Medication Sig Dispense Refill   albuterol (VENTOLIN HFA) 108 (90 Base) MCG/ACT inhaler Inhale 2 puffs into the lungs every 6 (six) hours as needed for wheezing or shortness of breath. (Patient taking differently: Inhale 2 puffs into the lungs every 6 (six) hours as needed for wheezing or shortness of breath.) 18 g 0   amphetamine-dextroamphetamine (ADDERALL XR) 20 MG 24 hr capsule Take 20 mg by mouth daily.     atorvastatin (LIPITOR) 10 MG tablet TAKE 1 TABLET(10 MG) BY MOUTH DAILY FOR CHOLESTEROL (Patient taking differently: Take 10 mg by mouth daily. TAKE 1 TABLET(10 MG) BY MOUTH DAILY for cholesterol.) 90 tablet 3   buPROPion (WELLBUTRIN XL) 300 MG 24 hr tablet Take 300 mg by mouth daily.     CALCIUM-MAGNESIUM-VITAMIN D ER PO Take 2 tablets by mouth daily.     cetirizine (ZYRTEC) 10 MG tablet Take 10 mg by mouth at bedtime.     clotrimazole-betamethasone (LOTRISONE) cream Apply to affected area 2 times  daily.  Use for one week. 30 g 0   famotidine (PEPCID AC) 10 MG tablet Take 10 mg by mouth as needed for heartburn or indigestion.     fluticasone (FLONASE) 50 MCG/ACT nasal spray SHAKE LIQUID AND USE 1 SPRAY IN EACH NOSTRIL TWICE DAILY AS NEEDED FOR ALLERGIES OR RHINITIS (Patient taking differently: Place 1 spray into both nostrils 2 (two) times daily as needed.) 16 g 3   HYDROcodone-acetaminophen (NORCO/VICODIN) 5-325 MG tablet Take 0.5-1 tablets by mouth 2 (two) times daily as needed for severe pain. Must last 30 days 60 tablet 0   hydrocortisone cream 0.5 % Apply 1 Application topically as needed for itching.     ibuprofen (ADVIL) 800 MG tablet Take 1 tablet (800 mg total) by mouth every 8 (eight) hours as needed. Takes as needed for pain. 30 tablet 0   lamoTRIgine (LAMICTAL) 200 MG tablet Take 300 mg by mouth at  bedtime.      LORazepam (ATIVAN) 1 MG tablet Take 0.5-1 mg by mouth every 8 (eight) hours as needed for anxiety or sleep. Per pt takes one nightly regularly     metaxalone (SKELAXIN) 800 MG tablet TAKE 1 TABLET(800 MG) BY MOUTH THREE TIMES DAILY AS NEEDED FOR MUSCLE SPASMS (Patient taking differently: Take 800 mg by mouth 3 (three) times daily as needed.) 90 tablet 0   metFORMIN (GLUCOPHAGE-XR) 500 MG 24 hr tablet TAKE 1 TABLET(500 MG) BY MOUTH DAILY WITH BREAKFAST (Patient taking differently: Take 500 mg by mouth daily with breakfast.) 90 tablet 3   montelukast (SINGULAIR) 10 MG tablet TAKE 1 TABLET(10 MG) BY MOUTH AT BEDTIME FOR ALLERGIES 90 tablet 2   naloxone (NARCAN) nasal spray 4 mg/0.1 mL Place 1 spray into the nose as needed for up to 365 doses (for opioid-induced respiratory depresssion). In case of emergency (overdose), spray once into each nostril. If no response within 3 minutes, repeat application and call 911. 1 each 0   PARoxetine (PAXIL-CR) 37.5 MG 24 hr tablet Take 2 tablets by mouth at bedtime.     RA ASPIRIN EC ADULT LOW ST 81 MG EC tablet take 1 tablet by mouth once  daily (Patient taking differently: Take 81 mg by mouth daily.) 30 tablet 0   spironolactone (ALDACTONE) 25 MG tablet TAKE 1 TABLET(25 MG) BY MOUTH DAILY (Patient taking differently: Take 25 mg by mouth daily. TAKE 1 TABLET(25 MG) BY MOUTH DAILY) 90 tablet 3   No current facility-administered medications for this visit.     ALLERGIES: Oxycodone, Penicillins, Red dye, and Sulfa antibiotics  Family History  Problem Relation Age of Onset   Cancer Mother        thyroid cancer   Hypertension Mother    Hypertension Father    Diabetes Father    Asthma Maternal Grandmother    Stroke Maternal Grandmother    Stroke Maternal Grandfather     Social History   Socioeconomic History   Marital status: Married    Spouse name: Not on file   Number of children: Not on file   Years of education: Not on file   Highest education level: Not on file  Occupational History   Not on file  Tobacco Use   Smoking status: Never   Smokeless tobacco: Never  Vaping Use   Vaping Use: Never used  Substance and Sexual Activity   Alcohol use: Never   Drug use: Never   Sexual activity: Yes    Birth control/protection: I.U.D.    Comment: 1st intercourse 56 yo-Fewer than 5 partners-Mirena  inserted 09-2014  Other Topics Concern   Not on file  Social History Narrative   Not on file   Social Determinants of Health   Financial Resource Strain: Not on file  Food Insecurity: Not on file  Transportation Needs: Not on file  Physical Activity: Not on file  Stress: Not on file  Social Connections: Not on file  Intimate Partner Violence: Not on file    Review of Systems  PHYSICAL EXAMINATION:    There were no vitals taken for this visit.    General appearance: alert, cooperative and appears stated age Head: Normocephalic, without obvious abnormality, atraumatic Neck: no adenopathy, supple, symmetrical, trachea midline and thyroid normal to inspection and palpation Lungs: clear to auscultation  bilaterally Breasts: normal appearance, no masses or tenderness, No nipple retraction or dimpling, No nipple discharge or bleeding, No axillary or supraclavicular adenopathy Heart: regular rate and  rhythm Abdomen: soft, non-tender, no masses,  no organomegaly Extremities: extremities normal, atraumatic, no cyanosis or edema Skin: Skin color, texture, turgor normal. No rashes or lesions Lymph nodes: Cervical, supraclavicular, and axillary nodes normal. No abnormal inguinal nodes palpated Neurologic: Grossly normal  Pelvic: External genitalia:  no lesions              Urethra:  normal appearing urethra with no masses, tenderness or lesions              Bartholins and Skenes: normal                 Vagina: normal appearing vagina with normal color and discharge, no lesions              Cervix: no lesions                Bimanual Exam:  Uterus:  normal size, contour, position, consistency, mobility, non-tender              Adnexa: no mass, fullness, tenderness              Rectal exam: {yes no:[id number]}.  Confirms.              Anus:  normal sphincter tone, no lesions  Chaperone was present for exam:  ***  ASSESSMENT     PLAN     An After Visit Summary was printed and given to the patient.  ______ minutes face to face time of which over 50% was spent in counseling.

## 2022-02-23 ENCOUNTER — Other Ambulatory Visit: Payer: Self-pay

## 2022-02-23 NOTE — Telephone Encounter (Signed)
Refill of Lotrisone is declined.  I need to assess her in the office to determine next steps in care.   I hope she recovers soon from the flu.  I will see her next week in the office if she is well enough for her visit.

## 2022-02-23 NOTE — Telephone Encounter (Signed)
Patient called. Has been using the Lotrisone cream that was prescribed 02/11/22.  She said the area seemed to be improving but she has been sick with the flu and very clammy/sweaty and it seems like it was not able to help.  She used the last of it today and is inquiring if you would prescribe it once more for her. She said you had mentioned her following with PCP about it but she said she had to see provider for flu and is coming for appt here next Monday and would like to try to avoid another appointment.

## 2022-02-24 NOTE — Telephone Encounter (Signed)
Ok for Diflucan 150 mg po q 3 days x 3.  Disp:  3 tablets.  RF:  none.

## 2022-02-24 NOTE — Telephone Encounter (Signed)
Per DPR access note on file left detailed message in patient's voice mail letting her know refill declined as Dr. Quincy Simmonds needs to see her to determine the next steps in her care and hopefully she will be well and able to keep her visit next week.

## 2022-02-24 NOTE — Telephone Encounter (Signed)
Patient received my message that Dr. Quincy Simmonds declined refilling the Lotrisone Cream and recommended she needs to see Dr. Quincy Simmonds next week to determine the next steps.  Patient called back asking what she is supposed to do until that visit. She said area is as bad as it was the day Dr. Quincy Simmonds saw it and the only other thing she can do is try to schedule with PCP but she will have to pay another copayment. She wants Dr. Quincy Simmonds to prescribe for her to avoid pcp visit before she sees Dr. Quincy Simmonds next week.

## 2022-02-25 ENCOUNTER — Other Ambulatory Visit: Payer: Self-pay

## 2022-02-25 ENCOUNTER — Telehealth: Payer: Self-pay | Admitting: Primary Care

## 2022-02-25 DIAGNOSIS — M7918 Myalgia, other site: Secondary | ICD-10-CM

## 2022-02-25 DIAGNOSIS — M62838 Other muscle spasm: Secondary | ICD-10-CM

## 2022-02-25 MED ORDER — FLUCONAZOLE 150 MG PO TABS: ORAL_TABLET | ORAL | 0 refills | Status: DC

## 2022-02-25 NOTE — Telephone Encounter (Signed)
Unable to reach patient. Left voicemail to return call to our office.   

## 2022-02-25 NOTE — Telephone Encounter (Signed)
Dr. Quincy Simmonds, when I went to send Rx patient has Allergy/Contraindication to Red Dye.  Please advise.

## 2022-02-25 NOTE — Addendum Note (Signed)
Addended by: Ramond Craver on: 02/25/2022 08:59 AM   Modules accepted: Orders

## 2022-02-25 NOTE — Telephone Encounter (Signed)
Patient called. She said red dye allergy is questionable because she has had red dye since without problems.  She has also taken Diflucan before with no problem. Please advise.

## 2022-02-25 NOTE — Telephone Encounter (Signed)
Rx for Skelaxin was provided in early November 2023 for 90 day supply. She should have 1 month remaining. Have her notify us when she is down to 1 week. It's too soon to fill now.

## 2022-02-25 NOTE — Telephone Encounter (Signed)
Ok to proceed with the Diflucan.  I am signing the order.   If she develops hives, she needs to take Benadryl 25 - 50 mg by mouth every 6 hours as needed.  If she develops shortness of breath or facial swelling, she needs to go to the ER.

## 2022-02-25 NOTE — Telephone Encounter (Signed)
Per DPR access note on file I left patient a detailed message in her voice mail advising ok to take the Diflucan and  f she develops hives, she needs to take Benadryl 25 - 50 mg by mouth every 6 hours as needed.  If she develops shortness of breath or facial swelling, she needs to go to the ER.      I asked her to call if any questions.

## 2022-02-25 NOTE — Telephone Encounter (Signed)
From: Tamala Julian To: Office of Pleas Koch, NP Sent: 02/24/2022 12:21 PM EST Subject: Medication Renewal Request  Refills have been requested for the following medications:   metaxalone (SKELAXIN) 800 MG tablet [Makih Stefanko K Kyreese Chio]  Preferred pharmacy: [facility] [place], [place] Delivery method: Pickup

## 2022-02-25 NOTE — Addendum Note (Signed)
Addended by: Yisroel Ramming, Dietrich Pates E on: 02/25/2022 03:07 PM   Modules accepted: Orders

## 2022-02-26 NOTE — Telephone Encounter (Signed)
Unable to reach patient. Left voicemail to return call to our office.   

## 2022-02-26 NOTE — Telephone Encounter (Signed)
Patient returned call regarding her medication. I relayed Clearence Cheek message to her and she said that she will double check her bottles,she think that she pulled up the wrong bottle.

## 2022-02-28 NOTE — Progress Notes (Unsigned)
PROVIDER NOTE: Information contained herein reflects review and annotations entered in association with encounter. Interpretation of such information and data should be left to medically-trained personnel. Information provided to patient can be located elsewhere in the medical record under "Patient Instructions". Document created using STT-dictation technology, any transcriptional errors that may result from process are unintentional.    Patient: Debra Myers  Service Category: E/M  Provider: Oswaldo Done, MD  DOB: 1966-01-24  DOS: 03/03/2022  Referring Provider: Doreene Nest, NP  MRN: [id number]  Specialty: Interventional Pain Management  PCP: Doreene Nest, NP  Type: Established Patient  Setting: Ambulatory outpatient    Location: Office  Delivery: Face-to-face     HPI  [name], a 57 y.o. year old female, is here today because of her No primary diagnosis found.. Debra Myers's primary complain today is No chief complaint on file. Last encounter: My last encounter with her was on 12/09/2021. Pertinent problems: Debra Myers has Numbness of upper extremity; Radiculitis involving upper extremity; Chronic low back pain (1ry area of Pain) (Bilateral) (L>R); Lumbar spondylosis (Bulging Disc & Severe Left Foraminal Stenosis at L3-4); Chronic lower extremity pain (2ry area of Pain) (Left); Lumbar foraminal stenosis (Severe Left L3-4); Lumbar facet syndrome (Bilateral) (L>R); Cervical spondylosis (C5-6 & C6-7 DDD); Cervical (3 mm) Grade 1 Anterolisthesis of C4 over C5; Chronic neck pain (3ry area of Pain) (Bilateral) (L>R); Chronic cervical radicular pain (Bilateral) (L>R); Cervical foraminal stenosis (multilevel) (Bilateral); Muscle spasm, nocturnal; Musculoskeletal pain; Carpal tunnel syndrome (Bilateral) (L>R); Cervical facet syndrome (Bilateral) (L>R); Chronic pain syndrome; DDD (degenerative disc disease), lumbar; DDD (degenerative disc disease), cervical; Pain in right knee; Chronic  pain of left knee; Osteoarthritis involving multiple joints; Acute exacerbation of chronic low back pain; and Pain in left knee on their pertinent problem list. Pain Assessment: Severity of   is reported as a  /10. Location:    / . Onset:  . Quality:  . Timing:  . Modifying factor(s):  Marland Kitchen Vitals:  vitals were not taken for this visit.  BMI: Estimated body mass index is 41.06 kg/m as calculated from the following:   Height as of 02/11/22: 5\' 3"  (1.6 m).   Weight as of 02/11/22: 231 lb 12.8 oz (105.1 kg).  Reason for encounter: medication management. ***    Pharmacotherapy Assessment  Analgesic: Hydrocodone/APAP 5/325, 2 tab PO QD (10 mg/day of hydrocodone) MME/day: 10 mg/day.   Monitoring: [place] PMP: PDMP reviewed during this encounter.       Pharmacotherapy: No side-effects or adverse reactions reported. Compliance: No problems identified. Effectiveness: Clinically acceptable.  No notes on file  No results found for: "CBDTHCR" No results found for: "D8THCCBX" No results found for: "D9THCCBX"  UDS:  Summary  Date Value Ref Range Status  08/05/2021 Note  Final    Comment:    ==================================================================== [facility] (MW) ==================================================================== Test                             Result       Flag       Units  Drug Present and Declared for Prescription Verification   Amphetamine                    948          EXPECTED   ng/mg creat    Amphetamine is available as a schedule II prescription drug.    Lorazepam  556          EXPECTED   ng/mg creat    Source of lorazepam is a scheduled prescription medication.    Hydrocodone                    667          EXPECTED   ng/mg creat   Dihydrocodeine                 102          EXPECTED   ng/mg creat   Norhydrocodone                 1229         EXPECTED   ng/mg creat    Sources of hydrocodone include scheduled prescription  medications.    Dihydrocodeine and norhydrocodone are expected metabolites of    hydrocodone. Dihydrocodeine is also available as a scheduled    prescription medication.  ==================================================================== Test                      Result    Flag   Units      Ref Range   Creatinine              95               mg/dL      >=58 ==================================================================== Declared Medications:  The flagging and interpretation on this report are based on the  following declared medications.  Unexpected results may arise from  inaccuracies in the declared medications.   **Note: The testing scope of this panel includes these medications:   Amphetamine (Vyvanse)  Hydrocodone (Norco)  Lorazepam (Ativan)   **Note: The testing scope of this panel does not include the  following reported medications:   Acetaminophen (Norco)  Albuterol  Aspirin  Atorvastatin  Bupropion (Wellbutrin XL)  Cetirizine (Zyrtec)  Fluticasone (Flonase)  Ibuprofen (Advil)  Lamotrigine (Lamictal)  Metaxalone (Skelaxin)  Metformin  Montelukast  Paroxetine (Paxil)  Spironolactone ==================================================================== For clinical consultation, please call [phone]. ====================================================================       ROS  Constitutional: Denies any fever or chills Gastrointestinal: No reported hemesis, hematochezia, vomiting, or acute GI distress Musculoskeletal: Denies any acute onset joint swelling, redness, loss of ROM, or weakness Neurological: No reported episodes of acute onset apraxia, aphasia, dysarthria, agnosia, amnesia, paralysis, loss of coordination, or loss of consciousness  Medication Review  Calcium-Magnesium-Vitamin D, HYDROcodone-acetaminophen, LORazepam, PARoxetine, albuterol, amphetamine-dextroamphetamine, aspirin EC, atorvastatin, buPROPion, cetirizine,  clotrimazole-betamethasone, famotidine, fluconazole, fluticasone, hydrocortisone cream, ibuprofen, lamoTRIgine, metFORMIN, metaxalone, montelukast, naloxone, and spironolactone  History Review  Allergy: Debra Myers is allergic to oxycodone, penicillins, red dye, and sulfa antibiotics. Drug: Debra Myers  reports no history of drug use. Alcohol:  reports no history of alcohol use. Tobacco:  reports that she has never smoked. She has never used smokeless tobacco. Social: Debra Myers  reports that she has never smoked. She has never used smokeless tobacco. She reports that she does not drink alcohol and does not use drugs. Medical:  has a past medical history of Bulging lumbar disc (L3-4) (02/26/2015), Chronic pain syndrome, DDD (degenerative disc disease), cervical, DDD (degenerative disc disease), lumbar, GAD (generalized anxiety disorder), GERD (gastroesophageal reflux disease), History of transient ischemic attack (TIA) (06/11/2015), Hypertension, MDD (major depressive disorder), Mild intermittent asthma, PCOS (polycystic ovarian syndrome), Pre-diabetes, Retained intrauterine contraceptive device (IUD), Seasonal allergic rhinitis, and Wears contact lenses. Surgical: Debra Myers  has a  past surgical history that includes Cholecystectomy, laparoscopic (11/22/1999); Wisdom tooth extraction; Dilatation & curettage/hysteroscopy with myosure (N/A, 09/10/2014); Laparoscopic gastric banding (04/2012); Hysteroscopy (N/A, 02/11/2022); and IUD removal (N/A, 02/11/2022). Family: family history includes Asthma in her maternal grandmother; Cancer in her mother; Diabetes in her father; Hypertension in her father and mother; Stroke in her maternal grandfather and maternal grandmother.  Laboratory Chemistry Profile   Renal Lab Results  Component Value Date   BUN 19 02/11/2022   CREATININE 0.90 09/32/6712   BCR NOT APPLICABLE 45/80/9983   GFR 70.13 12/29/2021   GFRAA >60 12/16/2017   GFRNONAA >60 02/11/2022     Hepatic Lab Results  Component Value Date   AST 18 12/29/2021   ALT 18 12/29/2021   ALBUMIN 4.4 12/29/2021   ALKPHOS 110 12/29/2021   HCVAB NEGATIVE 05/18/2013    Electrolytes Lab Results  Component Value Date   NA 137 02/11/2022   K 4.4 02/11/2022   CL 105 02/11/2022   CALCIUM 9.2 02/11/2022   PHOS 4.0 10/31/2015    Bone Lab Results  Component Value Date   VD25OH 27.38 (L) 12/29/2021    Inflammation (CRP: Acute Phase) (ESR: Chronic Phase) Lab Results  Component Value Date   ESRSEDRATE 32 05/26/2017         Note: Above Lab results reviewed.  Recent Imaging Review  US Transvaginal Non-OB Indication:  retained IUD, possible recent expulsion of IUD.   Findings: Pelvic US  Uterus 5.82 x 4.49 x 2.59 cm.  IUD in endometrial canal.  Left ovary 2.36 x 1.4 cm.  Right ovary 2.51 x 1.58 cm.  No adnexal masses.  No free fluid.   Impression:  IUD in normal position in the endometrial canal. Note: Reviewed        Physical Exam  General appearance: Well nourished, well developed, and well hydrated. In no apparent acute distress Mental status: Alert, oriented x 3 (person, place, & time)       Respiratory: No evidence of acute respiratory distress Eyes: PERLA Vitals: There were no vitals taken for this visit. BMI: Estimated body mass index is 41.06 kg/m as calculated from the following:   Height as of 02/11/22: 5\' 3"  (1.6 m).   Weight as of 02/11/22: 231 lb 12.8 oz (105.1 kg). Ideal: Patient weight not recorded  Assessment   Diagnosis Status  No diagnosis found. Controlled Controlled Controlled   Updated Problems: No problems updated.  Plan of Care  Problem-specific:  No problem-specific Assessment & Plan notes found for this encounter.  Debra Myers has a current medication list which includes the following long-term medication(s): albuterol, amphetamine-dextroamphetamine, atorvastatin, calcium-magnesium-vitamin d, famotidine, fluticasone,  hydrocodone-acetaminophen, metaxalone, metformin, montelukast, and spironolactone.  Pharmacotherapy (Medications Ordered): No orders of the defined types were placed in this encounter.  Orders:  No orders of the defined types were placed in this encounter.  Follow-up plan:   No follow-ups on file.     Interventional management options: Planned, scheduled, and/or pending:      Considering:   Diagnostic bilateral lumbar facet block  Possible bilateral lumbar facet RFA.  Diagnostic left L3-4 LESI  Diagnostic left L3 TFESI  Diagnostic left L4 TFESI  Diagnostic bilateral L5 TFESI  Diagnostic left CESI  Diagnostic bilateral cervical facet block  Possible bilateral cervical facet RFA.    Palliative PRN treatment(s):   None at this time      Recent Visits Date Type Provider Dept  12/09/21 Office Visit Milinda Pointer, MD [facility]  Showing recent visits within past 90 days and meeting all other requirements Future Appointments Date Type Provider Dept  03/03/22 Appointment Delano Metz, MD [facility]  Showing future appointments within next 90 days and meeting all other requirements  I discussed the assessment and treatment plan with the patient. The patient was provided an opportunity to ask questions and all were answered. The patient agreed with the plan and demonstrated an understanding of the instructions.  Patient advised to call back or seek an in-person evaluation if the symptoms or condition worsens.  Duration of encounter: *** minutes.  Total time on encounter, as per AMA guidelines included both the face-to-face and non-face-to-face time personally spent by the physician and/or other qualified health care professional(s) on the day of the encounter (includes time in activities that require the physician or other qualified health care professional and does not include time in activities normally performed by clinical staff). Physician's  time may include the following activities when performed: Preparing to see the patient (e.g., pre-charting review of records, searching for previously ordered imaging, lab work, and nerve conduction tests) Review of prior analgesic pharmacotherapies. Reviewing PMP Interpreting ordered tests (e.g., lab work, imaging, nerve conduction tests) Performing post-procedure evaluations, including interpretation of diagnostic procedures Obtaining and/or reviewing separately obtained history Performing a medically appropriate examination and/or evaluation Counseling and educating the patient/family/caregiver Ordering medications, tests, or procedures Referring and communicating with other health care professionals (when not separately reported) Documenting clinical information in the electronic or other health record Independently interpreting results (not separately reported) and communicating results to the patient/ family/caregiver Care coordination (not separately reported)  Note by: Oswaldo Done, MD Date: 03/03/2022; Time: 6:30 PM

## 2022-03-01 ENCOUNTER — Encounter: Payer: Managed Care, Other (non HMO) | Admitting: Obstetrics and Gynecology

## 2022-03-01 NOTE — Telephone Encounter (Signed)
Unable to reach patient. Left voicemail to return call to our office.   

## 2022-03-03 ENCOUNTER — Encounter: Payer: Self-pay | Admitting: Pain Medicine

## 2022-03-03 ENCOUNTER — Ambulatory Visit: Payer: Managed Care, Other (non HMO) | Attending: Pain Medicine | Admitting: Pain Medicine

## 2022-03-03 VITALS — BP 158/77 | HR 94 | Temp 97.6°F | Resp 16 | Ht 63.0 in | Wt 232.0 lb

## 2022-03-03 DIAGNOSIS — G894 Chronic pain syndrome: Secondary | ICD-10-CM | POA: Diagnosis not present

## 2022-03-03 DIAGNOSIS — M542 Cervicalgia: Secondary | ICD-10-CM | POA: Diagnosis not present

## 2022-03-03 DIAGNOSIS — M47812 Spondylosis without myelopathy or radiculopathy, cervical region: Secondary | ICD-10-CM | POA: Diagnosis present

## 2022-03-03 DIAGNOSIS — M5412 Radiculopathy, cervical region: Secondary | ICD-10-CM | POA: Insufficient documentation

## 2022-03-03 DIAGNOSIS — M7918 Myalgia, other site: Secondary | ICD-10-CM | POA: Insufficient documentation

## 2022-03-03 DIAGNOSIS — Z79891 Long term (current) use of opiate analgesic: Secondary | ICD-10-CM | POA: Diagnosis present

## 2022-03-03 DIAGNOSIS — Z79899 Other long term (current) drug therapy: Secondary | ICD-10-CM | POA: Insufficient documentation

## 2022-03-03 DIAGNOSIS — M79605 Pain in left leg: Secondary | ICD-10-CM | POA: Diagnosis not present

## 2022-03-03 DIAGNOSIS — M545 Low back pain, unspecified: Secondary | ICD-10-CM | POA: Insufficient documentation

## 2022-03-03 DIAGNOSIS — G8929 Other chronic pain: Secondary | ICD-10-CM | POA: Insufficient documentation

## 2022-03-03 DIAGNOSIS — M47816 Spondylosis without myelopathy or radiculopathy, lumbar region: Secondary | ICD-10-CM | POA: Insufficient documentation

## 2022-03-03 MED ORDER — HYDROCODONE-ACETAMINOPHEN 5-325 MG PO TABS: 0.5000 | ORAL_TABLET | Freq: Two times a day (BID) | ORAL | 0 refills | Status: DC | PRN

## 2022-03-03 NOTE — Patient Instructions (Signed)
____________________________________________________________________________________________  Patient Information update  To: All of our patients.  Re: Name change.  It has been made official that our current name, "[facility]"   will soon be changed to "[facility]".   The purpose of this change is to eliminate any confusion created by the concept of our practice being a "Medication Management Pain Clinic". In the past this has led to the misconception that we treat pain primarily by the use of prescription medications.  Nothing can be farther from the truth.   Understanding PAIN MANAGEMENT: To further understand what our practice does, you first have to understand that "Pain Management" is a subspecialty that requires additional training once a physician has completed their specialty training, which can be in either Anesthesia, Neurology, Psychiatry, or Physical Medicine and Rehabilitation (PMR). Each one of these contributes to the final approach taken by each physician to the management of their patient's pain. To be a "Pain Management Specialist" you must have first completed one of the specialty trainings below.  Anesthesiologists - trained in clinical pharmacology and interventional techniques such as nerve blockade and regional as well as central neuroanatomy. They are trained to block pain before, during, and after surgical interventions.  Neurologists - trained in the diagnosis and pharmacological treatment of complex neurological conditions, such as Multiple Sclerosis, Parkinson's, spinal cord injuries, and other systemic conditions that may be associated with symptoms that may include but are not limited to pain. They tend to rely primarily on the treatment of chronic pain using prescription medications.  Psychiatrist - trained in conditions affecting the psychosocial  wellbeing of patients including but not limited to depression, anxiety, schizophrenia, personality disorders, addiction, and other substance use disorders that may be associated with chronic pain. They tend to rely primarily on the treatment of chronic pain using prescription medications.   Physical Medicine and Rehabilitation (PMR) physicians, also known as physiatrists - trained to treat a wide variety of medical conditions affecting the brain, spinal cord, nerves, bones, joints, ligaments, muscles, and tendons. Their training is primarily aimed at treating patients that have suffered injuries that have caused severe physical impairment. Their training is primarily aimed at the physical therapy and rehabilitation of those patients. They may also work alongside orthopedic surgeons or neurosurgeons using their expertise in assisting surgical patients to recover after their surgeries.  INTERVENTIONAL PAIN MANAGEMENT is sub-subspecialty of Pain Management.  Our physicians are Board-certified in Anesthesia, Pain Management, and Interventional Pain Management.  This meaning that not only have they been trained and Board-certified in their specialty of Anesthesia, and subspecialty of Pain Management, but they have also received further training in the sub-subspecialty of Interventional Pain Management, in order to become Board-certified as INTERVENTIONAL PAIN MANAGEMENT SPECIALIST.    Mission: Our goal is to use our skills in  INTERVENTIONAL PAIN MANAGEMENT as alternatives to the chronic use of prescription opioid medications for the treatment of pain. To make this more clear, we have changed our name to reflect what we do and offer. We will continue to offer medication management assessment and recommendations, but we will not be taking over any patient's medication management.  ____________________________________________________________________________________________      ____________________________________________________________________________________________  National Pain Medication Shortage  The [place] is experiencing worsening drug shortages. These have had a negative widespread effect on patient care and treatment. Not expected to improve any time soon. Predicted to last past 2029.   Drug shortage list (generic   names) Oxycodone IR Oxycodone/APAP Oxymorphone IR Hydromorphone Hydrocodone/APAP Morphine  Where is the problem?  Manufacturing and supply level.  Will this shortage affect you?  Only if you take any of the above pain medications.  How? You may be unable to fill your prescription.  Your pharmacist may offer a "partial fill" of your prescription. (Warning: Do not accept partial fills.) Prescriptions partially filled cannot be transferred to another pharmacy. Read our Medication Rules and Regulation. Depending on how much medicine you are dependent on, you may experience withdrawals when unable to get the medication.  Recommendations: Consider ending your dependence on opioid pain medications. Ask your pain specialist to assist you with the process. Consider switching to a medication currently not in shortage, such as Buprenorphine. Talk to your pain specialist about this option. Consider decreasing your pain medication requirements by managing tolerance thru "Drug Holidays". This may help minimize withdrawals, should you run out of medicine. Control your pain thru the use of non-pharmacological interventional therapies.   Your prescriber: Prescribers cannot be blamed for shortages. Medication manufacturing and supply issues cannot be fixed by the prescriber.   NOTE: The prescriber is not responsible for supplying the medication, or solving supply issues. Work with your pharmacist to solve it. The patient is responsible for the decision to take or continue taking the medication and for identifying and securing a legal supply source. By  law, supplying the medication is the job and responsibility of the pharmacy. The prescriber is responsible for the evaluation, monitoring, and prescribing of these medications.   Prescribers will NOT: Re-issue prescriptions that have been partially filled. Re-issue prescriptions already sent to a pharmacy.  Re-send prescriptions to a different pharmacy because yours did not have your medication. Ask pharmacist to order more medicine or transfer the prescription to another pharmacy. (Read below.)  New 2023 regulation: "October 23, 2021 Revised Regulation Allows DEA-Registered Pharmacies to Transfer Electronic Prescriptions at a Patient's Request DEA Headquarters Division - Public Information Office Patients now have the ability to request their electronic prescription be transferred to another pharmacy without having to go back to their practitioner to initiate the request. This revised regulation went into effect on Monday, October 19, 2021.     At a patient's request, a DEA-registered retail pharmacy can now transfer an electronic prescription for a controlled substance (schedules II-V) to another DEA-registered retail pharmacy. Prior to this change, patients would have to go through their practitioner to cancel their prescription and have it re-issued to a different pharmacy. The process was taxing and time consuming for both patients and practitioners.    The Drug Enforcement Administration (DEA) published its intent to revise the process for transferring electronic prescriptions on January 11, 2020.  The final rule was published in the federal register on September 17, 2021 and went into effect 30 days later.  Under the final rule, a prescription can only be transferred once between pharmacies, and only if allowed under existing state or other applicable law. The prescription must remain in its electronic form; may not be altered in any way; and the transfer must be communicated directly between  two licensed pharmacists. It's important to note, any authorized refills transfer with the original prescription, which means the entire prescription will be filled at the same pharmacy".  Reference: https://www.dea.gov/stories/2023/2023-10/2021-09-01/revised-regulation-allows-dea-registered-pharmacies-transfer (DEA website announcement)  https://www.govinfo.gov/content/pkg/FR-2021-09-17/pdf/2023-15847.pdf (Federal Register  Department of Justice)   Federal Register / Vol. 88, No. 143 / Thursday, September 17, 2021 / Rules and Regulations DEPARTMENT OF JUSTICE  Drug Enforcement   Administration  21 CFR Part 1306  [Docket No. [id number]]  RIN 1117-AB64 Transfer of Electronic Prescriptions for Schedules II-V Controlled Substances Between Pharmacies for Initial Filling  ____________________________________________________________________________________________     _______________________________________________________________________  Medication Rules  Purpose: To inform patients, and their family members, of our medication rules and regulations.  Applies to: All patients receiving prescriptions from our practice (written or electronic).  Pharmacy of record: This is the pharmacy where your electronic prescriptions will be sent. Make sure we have the correct one.  Electronic prescriptions: In compliance with the [place] Strengthen Opioid Misuse Prevention (STOP) Act of 2017 (Session Law 2017-74/H243), effective February 22, 2018, all controlled substances must be electronically prescribed. Written prescriptions, faxing, or calling prescriptions to a pharmacy will no longer be done.  Prescription refills: These will be provided only during in-person appointments. No medications will be renewed without a "face-to-face" evaluation with your provider. Applies to all prescriptions.  NOTE: The following applies primarily to controlled substances (Opioid* Pain Medications).   Type of encounter  (visit): For patients receiving controlled substances, face-to-face visits are required. (Not an option and not up to the patient.)  Patient's responsibilities: Pain Pills: Bring all pain pills to every appointment (except for procedure appointments). Pill Bottles: Bring pills in original pharmacy bottle. Bring bottle, even if empty. Always bring the bottle of the most recent fill.  Medication refills: You are responsible for knowing and keeping track of what medications you are taking and when is it that you will need a refill. The day before your appointment: write a list of all prescriptions that need to be refilled. The day of the appointment: give the list to the admitting nurse. Prescriptions will be written only during appointments. No prescriptions will be written on procedure days. If you forget a medication: it will not be "Called in", "Faxed", or "electronically sent". You will need to get another appointment to get these prescribed. No early refills. Do not call asking to have your prescription filled early. Partial  or short prescriptions: Occasionally your pharmacy may not have enough pills to fill your prescription.  NEVER ACCEPT a partial fill or a prescription that is short of the total amount of pills that you were prescribed.  With controlled substances the law allows 72 hours for the pharmacy to complete the prescription.  If the prescription is not completed within 72 hours, the pharmacist will require a new prescription to be written. This means that you will be short on your medicine and we WILL NOT send another prescription to complete your original prescription.  Instead, request the pharmacy to send a carrier to a nearby branch to get enough medication to provide you with your full prescription. Prescription Accuracy: You are responsible for carefully inspecting your prescriptions before leaving our office. Have the discharge nurse carefully go over each prescription with you,  before taking them home. Make sure that your name is accurately spelled, that your address is correct. Check the name and dose of your medication to make sure it is accurate. Check the number of pills, and the written instructions to make sure they are clear and accurate. Make sure that you are given enough medication to last until your next medication refill appointment. Taking Medication: Take medication as prescribed. When it comes to controlled substances, taking less pills or less frequently than prescribed is permitted and encouraged. Never take more pills than instructed. Never take the medication more frequently than prescribed.  Inform other Doctors: Always inform, all of   your healthcare providers, of all the medications you take. Pain Medication from other Providers: You are not allowed to accept any additional pain medication from any other Doctor or Healthcare provider. There are two exceptions to this rule. (see below) In the event that you require additional pain medication, you are responsible for notifying us, as stated below. Cough Medicine: Often these contain an opioid, such as codeine or hydrocodone. Never accept or take cough medicine containing these opioids if you are already taking an opioid* medication. The combination may cause respiratory failure and death. Medication Agreement: You are responsible for carefully reading and following our Medication Agreement. This must be signed before receiving any prescriptions from our practice. Safely store a copy of your signed Agreement. Violations to the Agreement will result in no further prescriptions. (Additional copies of our Medication Agreement are available upon request.) Laws, Rules, & Regulations: All patients are expected to follow all Federal and State Laws, Statutes, Rules, & Regulations. Ignorance of the Laws does not constitute a valid excuse.  Illegal drugs and Controlled Substances: The use of illegal substances (including,  but not limited to marijuana and its derivatives) and/or the illegal use of any controlled substances is strictly prohibited. Violation of this rule may result in the immediate and permanent discontinuation of any and all prescriptions being written by our practice. The use of any illegal substances is prohibited. Adopted CDC guidelines & recommendations: Target dosing levels will be at or below 60 MME/day. Use of benzodiazepines** is not recommended.  Exceptions: There are only two exceptions to the rule of not receiving pain medications from other Healthcare Providers. Exception #1 (Emergencies): In the event of an emergency (i.e.: accident requiring emergency care), you are allowed to receive additional pain medication. However, you are responsible for: As soon as you are able, call our office [phone], at any time of the day or night, and leave a message stating your name, the date and nature of the emergency, and the name and dose of the medication prescribed. In the event that your call is answered by a member of our staff, make sure to document and save the date, time, and the name of the person that took your information.  Exception #2 (Planned Surgery): In the event that you are scheduled by another doctor or dentist to have any type of surgery or procedure, you are allowed (for a period no longer than 30 days), to receive additional pain medication, for the acute post-op pain. However, in this case, you are responsible for picking up a copy of our "Post-op Pain Management for Surgeons" handout, and giving it to your surgeon or dentist. This document is available at our office, and does not require an appointment to obtain it. Simply go to our office during business hours (Monday-Thursday from 8:00 AM to 4:00 PM) (Friday 8:00 AM to 12:00 Noon) or if you have a scheduled appointment with us, prior to your surgery, and ask for it by name. In addition, you are responsible for: calling our office  [phone], at any time of the day or night, and leaving a message stating your name, name of your surgeon, type of surgery, and date of procedure or surgery. Failure to comply with your responsibilities may result in termination of therapy involving the controlled substances. Medication Agreement Violation. Following the above rules, including your responsibilities will help you in avoiding a Medication Agreement Violation ("Breaking your Pain Medication Contract").  Consequences:  Not following the above rules may result   in permanent discontinuation of medication prescription therapy.  *Opioid medications include: morphine, codeine, oxycodone, oxymorphone, hydrocodone, hydromorphone, meperidine, tramadol, tapentadol, buprenorphine, fentanyl, methadone. **Benzodiazepine medications include: diazepam (Valium), alprazolam (Xanax), clonazepam (Klonopine), lorazepam (Ativan), clorazepate (Tranxene), chlordiazepoxide (Librium), estazolam (Prosom), oxazepam (Serax), temazepam (Restoril), triazolam (Halcion) (Last updated: 12/15/2021) ______________________________________________________________________    ______________________________________________________________________  Medication Recommendations and Reminders  Applies to: All patients receiving prescriptions (written and/or electronic).  Medication Rules & Regulations: You are responsible for reading, knowing, and following our "Medication Rules" document. These exist for your safety and that of others. They are not flexible and neither are we. Dismissing or ignoring them is an act of "non-compliance" that may result in complete and irreversible termination of such medication therapy. For safety reasons, "non-compliance" will not be tolerated. As with the [place] fundamental legal principle of "ignorance of the law is no defense", we will accept no excuses for not having read and knowing the content of documents provided to you by our  practice.  Pharmacy of record:  Definition: This is the pharmacy where your electronic prescriptions will be sent.  We do not endorse any particular pharmacy. It is up to you and your insurance to decide what pharmacy to use.  We do not restrict you in your choice of pharmacy. However, once we write for your prescriptions, we will NOT be re-sending more prescriptions to fix restricted supply problems created by your pharmacy, or your insurance.  The pharmacy listed in the electronic medical record should be the one where you want electronic prescriptions to be sent. If you choose to change pharmacy, simply notify our nursing staff. Changes will be made only during your regular appointments and not over the phone.  Recommendations: Keep all of your pain medications in a safe place, under lock and key, even if you live alone. We will NOT replace lost, stolen, or damaged medication. We do not accept "Police Reports" as proof of medications having been stolen. After you fill your prescription, take 1 week's worth of pills and put them away in a safe place. You should keep a separate, properly labeled bottle for this purpose. The remainder should be kept in the original bottle. Use this as your primary supply, until it runs out. Once it's gone, then you know that you have 1 week's worth of medicine, and it is time to come in for a prescription refill. If you do this correctly, it is unlikely that you will ever run out of medicine. To make sure that the above recommendation works, it is very important that you make sure your medication refill appointments are scheduled at least 1 week before you run out of medicine. To do this in an effective manner, make sure that you do not leave the office without scheduling your next medication management appointment. Always ask the nursing staff to show you in your prescription , when your medication will be running out. Then arrange for the receptionist to get you a  return appointment, at least 7 days before you run out of medicine. Do not wait until you have 1 or 2 pills left, to come in. This is very poor planning and does not take into consideration that we may need to cancel appointments due to bad weather, sickness, or emergencies affecting our staff. DO NOT ACCEPT A "Partial Fill": If for any reason your pharmacy does not have enough pills/tablets to completely fill or refill your prescription, do not allow for a "partial fill". The law allows the pharmacy to complete   that prescription within 72 hours, without requiring a new prescription. If they do not fill the rest of your prescription within those 72 hours, you will need a separate prescription to fill the remaining amount, which we will NOT provide. If the reason for the partial fill is your insurance, you will need to talk to the pharmacist about payment alternatives for the remaining tablets, but again, DO NOT ACCEPT A PARTIAL FILL, unless you can trust your pharmacist to obtain the remainder of the pills within 72 hours.  Prescription refills and/or changes in medication(s):  Prescription refills, and/or changes in dose or medication, will be conducted only during scheduled medication management appointments. (Applies to both, written and electronic prescriptions.) No refills on procedure days. No medication will be changed or started on procedure days. No changes, adjustments, and/or refills will be conducted on a procedure day. Doing so will interfere with the diagnostic portion of the procedure. No phone refills. No medications will be "called into the pharmacy". No Fax refills. No weekend refills. No Holliday refills. No after hours refills.  Remember:  Business hours are:  Monday to Thursday 8:00 AM to 4:00 PM Provider's Schedule: Endre Coutts, MD - Appointments are:  Medication management: Monday and Wednesday 8:00 AM to 4:00 PM Procedure day: Tuesday and Thursday 7:30 AM to 4:00  PM Bilal Lateef, MD - Appointments are:  Medication management: Tuesday and Thursday 8:00 AM to 4:00 PM Procedure day: Monday and Wednesday 7:30 AM to 4:00 PM (Last update: 12/15/2021) ______________________________________________________________________    ____________________________________________________________________________________________  Drug Holidays  What is a "Drug Holiday"? Drug Holiday: is the name given to the process of slowly tapering down and temporarily stopping the pain medication for the purpose of decreasing or eliminating tolerance to the drug.  Benefits Improved effectiveness Decreased required effective dose Improved pain control End dependence on high dose therapy Decrease cost of therapy Uncovering "opioid-induced hyperalgesia". (OIH)  What is "opioid hyperalgesia"? It is a paradoxical increase in pain caused by exposure to opioids. Stopping the opioid pain medication, contrary to the expected, it actually decreases or completely eliminates the pain. Ref.: "A comprehensive review of opioid-induced hyperalgesia". Marion Lee, et.al. Pain Physician. 2011 Mar-Apr;14(2):145-61.  What is tolerance? Tolerance: the progressive loss of effectiveness of a pain medicine due to repetitive use. A common problem of opioid pain medications.  How long should a "Drug Holiday" last? Effectiveness depends on the patient staying off all opioid pain medicines for a minimum of 14 consecutive days. (2 weeks)  How about just taking less of the medicine? Does not work. Will not accomplish goal of eliminating the excess receptors.  How about switching to a different pain medicine? (AKA. "Opioid rotation") Does not work. Creates the illusion of effectiveness by taking advantage of inaccurate equivalent dose calculations between different opioids. -This "technique" was promoted by studies funded by pharmaceutical companies, such as [employer], creators of  "OxyContin".  Can I stop the medicine "cold turkey"? Depends. You should always coordinate with your Pain Specialist to make the transition as smoothly as possible. Avoid stopping the medicine abruptly without consulting. We recommend a "slow taper".  What is a slow taper? Taper: refers to the gradual decrease in dose.   How do I stop/taper the dose? Slowly. Decrease the daily amount of pills that you take by one (1) pill every seven (7) days. This is called a "slow downward taper". Example: if you normally take four (4) pills per day, drop it to three (3) pills per day   for seven (7) days, then to two (2) pills per day for seven (7) days, then to one (1) per day for seven (7) days, and then stop the medicine. The 14 day "Drug Holiday" starts on the first day without medicine.   Will I experience withdrawals? Unlikely with a slow taper.  What triggers withdrawals? Withdrawals are triggered by the sudden/abrupt stop of high dose opioids. Withdrawals can be avoided by slowly decreasing the dose over a prolonged period of time.  What are withdrawals? Symptoms associated with sudden/abrupt reduction/stopping of high-dose, long-term use of pain medication. Withdrawal are seldom seen on low dose therapy, or patients rarely taking opioid medication.  Early Withdrawal Symptoms may include: Agitation Anxiety Muscle aches Increased tearing Insomnia Runny nose Sweating Yawning  Late symptoms may include: Abdominal cramping Diarrhea Dilated pupils Goose bumps Nausea Vomiting  (Last update: 01/31/2022) ____________________________________________________________________________________________    ____________________________________________________________________________________________  WARNING: CBD (cannabidiol) & Delta (Delta-8 tetrahydrocannabinol) products.   Applicable to:  All individuals currently taking or considering taking CBD (cannabidiol) and, more important, all  patients taking opioid analgesic controlled substances (pain medication). (Example: oxycodone; oxymorphone; hydrocodone; hydromorphone; morphine; methadone; tramadol; tapentadol; fentanyl; buprenorphine; butorphanol; dextromethorphan; meperidine; codeine; etc.)  Introduction:  Recently there has been a drive towards the use of "natural" products for the treatment of different conditions, including pain anxiety and sleep disorders. Marijuana and hemp are two varieties of the cannabis genus plants. Marijuana and its derivatives are illegal, while hemp and its derivatives are not. Cannabidiol (CBD) and tetrahydrocannabinol (THC), are two natural compounds found in plants of the Cannabis genus. They can both be extracted from hemp or marijuana. Both compounds interact with your body's endocannabinoid system in very different ways. CBD is associated with pain relief (analgesia) while THC is associated with the psychoactive effects ("the high") obtained from the use of marijuana products. There are two main types of THC: Delta-9, which comes from the marijuana plant and it is illegal, and Delta-8, which comes from the hemp plant, and it is legal. (Both, Delta-9-THC and Delta-8-THC are psychoactive and give you "the high".)   Legality:  Marijuana and its derivatives: illegal Hemp and its derivatives: Legal (State dependent) UPDATE: (04/10/2021) The Drug Enforcement Agency (DEA) issued a letter stating that "delta" cannabinoids, including Delta-8-THCO and Delta-9-THCO, synthetically derived from hemp do not qualify as hemp and will be viewed as Schedule I drugs. (Schedule I drugs, substances, or chemicals are defined as drugs with no currently accepted medical use and a high potential for abuse. Some examples of Schedule I drugs are: heroin, lysergic acid diethylamide (LSD), marijuana (cannabis), 3,4-methylenedioxymethamphetamine (ecstasy), methaqualone, and peyote.) (https://www.dea.gov)  Legal status of CBD in  [place]:  "Conditionally Legal"  Reference: "FDA Regulation of Cannabis and Cannabis-Derived Products, Including Cannabidiol (CBD)" - https://www.fda.gov/news-events/public-health-focus/fda-regulation-cannabis-and-cannabis-derived-products-including-cannabidiol-cbd  Warning:  CBD is not FDA approved and has not undergo the same manufacturing controls as prescription drugs.  This means that the purity and safety of available CBD may be questionable. Most of the time, despite manufacturer's claims, it is contaminated with THC (delta-9-tetrahydrocannabinol - the chemical in marijuana responsible for the "HIGH").  When this is the case, the THC contaminant will trigger a positive urine drug screen (UDS) test for Marijuana (carboxy-THC).   The FDA recently put out a warning about 5 things that everyone should be aware of regarding Delta-8 THC: Delta-8 THC products have not been evaluated or approved by the FDA for safe use and may be marketed in ways that put the public health at   risk. The FDA has received adverse event reports involving delta-8 THC-containing products. Delta-8 THC has psychoactive and intoxicating effects. Delta-8 THC manufacturing often involve use of potentially harmful chemicals to create the concentrations of delta-8 THC claimed in the marketplace. The final delta-8 THC product may have potentially harmful by-products (contaminants) due to the chemicals used in the process. Manufacturing of delta-8 THC products may occur in uncontrolled or unsanitary settings, which may lead to the presence of unsafe contaminants or other potentially harmful substances. Delta-8 THC products should be kept out of the reach of children and pets.  NOTE: Because a positive UDS for any illicit substance is a violation of our medication agreement, your opioid analgesics (pain medicine) may be permanently discontinued.  MORE ABOUT CBD  General Information: CBD was discovered in 1940 and it is a derivative of  the cannabis sativa genus plants (Marijuana and Hemp). It is one of the 113 identified substances found in Marijuana. It accounts for up to 40% of the plant's extract. As of 2018, preliminary clinical studies on CBD included research for the treatment of anxiety, movement disorders, and pain. CBD is available and consumed in multiple forms, including inhalation of smoke or vapor, as an aerosol spray, and by mouth. It may be supplied as an oil containing CBD, capsules, dried cannabis, or as a liquid solution. CBD is thought not to be as psychoactive as THC (delta-9-tetrahydrocannabinol - the chemical in marijuana responsible for the "HIGH"). Studies suggest that CBD may interact with different biological target receptors in the body, including cannabinoid and other neurotransmitter receptors. As of 2018 the mechanism of action for its biological effects has not been determined.  Side-effects  Adverse reactions: Dry mouth, diarrhea, decreased appetite, fatigue, drowsiness, malaise, weakness, sleep disturbances, and others.  Drug interactions:  CBD may interact with medications such as blood-thinners. CBD causes drowsiness on its own and it will increase drowsiness caused by other medications, including antihistamines (such as Benadryl), benzodiazepines (Xanax, Ativan, Valium), antipsychotics, antidepressants, opioids, alcohol and supplements such as kava, melatonin and St. John's Wort.  Other drug interactions: Brivaracetam (Briviact); Caffeine; Carbamazepine (Tegretol); Citalopram (Celexa); Clobazam (Onfi); Eslicarbazepine (Aptiom); Everolimus (Zostress); Lithium; Methadone (Dolophine); Rufinamide (Banzel); Sedative medications (CNS depressants); Sirolimus (Rapamune); Stiripentol (Diacomit); Tacrolimus (Prograf); Tamoxifen ; Soltamox); Topiramate (Topamax); Valproate; Warfarin (Coumadin); Zonisamide. (Last update:  02/01/2022) ____________________________________________________________________________________________   ____________________________________________________________________________________________  Naloxone Nasal Spray  Why am I receiving this medication? [place] STOP ACT requires that all patients taking high dose opioids or at risk of opioids respiratory depression, be prescribed an opioid reversal agent, such as Naloxone (AKA: Narcan).  What is this medication? NALOXONE (nal OX one) treats opioid overdose, which causes slow or shallow breathing, severe drowsiness, or trouble staying awake. Call emergency services after using this medication. You may need additional treatment. Naloxone works by reversing the effects of opioids. It belongs to a group of medications called opioid blockers.  COMMON BRAND NAME(S): Kloxxado, Narcan  What should I tell my care team before I take this medication? They need to know if you have any of these conditions: Heart disease Substance use disorder An unusual or allergic reaction to naloxone, other medications, foods, dyes, or preservatives Pregnant or trying to get pregnant Breast-feeding  When to use this medication? This medication is to be used for the treatment of respiratory depression (less than 8 breaths per minute) secondary to opioid overdose.   How to use this medication? This medication is for use in the nose. Lay the person on their   back. Support their neck with your hand and allow the head to tilt back before giving the medication. The nasal spray should be given into 1 nostril. After giving the medication, move the person onto their side. Do not remove or test the nasal spray until ready to use. Get emergency medical help right away after giving the first dose of this medication, even if the person wakes up. You should be familiar with how to recognize the signs and symptoms of a narcotic overdose. If more doses are needed, give  the additional dose in the other nostril. Talk to your care team about the use of this medication in children. While this medication may be prescribed for children as young as newborns for selected conditions, precautions do apply.  Naloxone Overdosage: If you think you have taken too much of this medicine contact a poison control center or emergency room at once.  NOTE: This medicine is only for you. Do not share this medicine with others.  What if I miss a dose? This does not apply.  What may interact with this medication? This is only used during an emergency. No interactions are expected during emergency use. This list may not describe all possible interactions. Give your health care provider a list of all the medicines, herbs, non-prescription drugs, or dietary supplements you use. Also tell them if you smoke, drink alcohol, or use illegal drugs. Some items may interact with your medicine.  What should I watch for while using this medication? Keep this medication ready for use in the case of an opioid overdose. Make sure that you have the phone number of your care team and local hospital ready. You may need to have additional doses of this medication. Each nasal spray contains a single dose. Some emergencies may require additional doses. After use, bring the treated person to the nearest hospital or call 911. Make sure the treating care team knows that the person has received a dose of this medication. You will receive additional instructions on what to do during and after use of this medication before an emergency occurs.  What side effects may I notice from receiving this medication? Side effects that you should report to your care team as soon as possible: Allergic reactions--skin rash, itching, hives, swelling of the face, lips, tongue, or throat Side effects that usually do not require medical attention (report these to your care team if they continue or are  bothersome): Constipation Dryness or irritation inside the nose Headache Increase in blood pressure Muscle spasms Stuffy nose Toothache This list may not describe all possible side effects. Call your doctor for medical advice about side effects. You may report side effects to FDA at 1-800-FDA-1088.  Where should I keep my medication? Because this is an emergency medication, you should keep it with you at all times.  Keep out of the reach of children and pets. Store between 20 and 25 degrees C (68 and 77 degrees F). Do not freeze. Throw away any unused medication after the expiration date. Keep in original box until ready to use.  NOTE: This sheet is a summary. It may not cover all possible information. If you have questions about this medicine, talk to your doctor, pharmacist, or health care provider.   2023 Elsevier/Gold Standard (2020-10-17 00:00:00)  ____________________________________________________________________________________________   

## 2022-03-03 NOTE — Progress Notes (Signed)
Nursing Pain Medication Assessment:  Safety precautions to be maintained throughout the outpatient stay will include: orient to surroundings, keep bed in low position, maintain call bell within reach at all times, provide assistance with transfer out of bed and ambulation.  Medication Inspection Compliance: Pill count conducted under aseptic conditions, in front of the patient. Neither the pills nor the bottle was removed from the patient's sight at any time. Once count was completed pills were immediately returned to the patient in their original bottle.  Medication: Hydrocodone/APAP Pill/Patch Count:  24 of 60 pills remain Pill/Patch Appearance: Markings consistent with prescribed medication Bottle Appearance: Standard pharmacy container. Clearly labeled. Filled Date: 73 / 23 / 2023 Last Medication intake:  Today

## 2022-03-09 NOTE — Telephone Encounter (Signed)
Sent patient mychart message

## 2022-03-11 ENCOUNTER — Telehealth: Payer: Self-pay | Admitting: Obstetrics and Gynecology

## 2022-03-11 NOTE — Telephone Encounter (Signed)
Please schedule post op appointment with me.  Patient cancelled and has not rescheduled yet.

## 2022-03-11 NOTE — Telephone Encounter (Signed)
Left message to call Yaritzy Huser, RN at [facility], [phone], OPT [facility].  

## 2022-03-15 NOTE — Telephone Encounter (Signed)
Left message to call Marjani Kobel, RN at [facility], [phone], OPT 5.  

## 2022-03-29 NOTE — Telephone Encounter (Signed)
No response from patient.   MyChart message sent.  

## 2022-03-30 ENCOUNTER — Encounter: Payer: Self-pay | Admitting: Obstetrics and Gynecology

## 2022-03-30 NOTE — Telephone Encounter (Signed)
I just sent patient a My Chart message.

## 2022-03-30 NOTE — Telephone Encounter (Signed)
No response from patient. MyChart messages not read to date.   Routing to Dr. Quincy Simmonds to advise.

## 2022-04-13 NOTE — Telephone Encounter (Signed)
No response from patient.  MyChart messages not read.   Routing to Dr. Quincy Simmonds to advise.

## 2022-04-19 NOTE — Telephone Encounter (Signed)
Spoke with patient and read her Dr. Elza Rafter message. She has not yet had post op visit and would like to schedule. Message sent to appt desk to arrange post op visit appt.

## 2022-04-20 NOTE — Telephone Encounter (Signed)
Has appointment on 04/26/22.

## 2022-04-20 NOTE — Telephone Encounter (Signed)
Post op visit scheduled 04/26/22 at 11:30am.

## 2022-04-22 NOTE — Progress Notes (Signed)
GYNECOLOGY  VISIT   HPI: 57 y.o.   Married  Caucasian  female   [id number] with No LMP recorded. Patient is perimenopausal.   here for   post op.  Status post  Hysteroscopy with IUD removal, Myosure resection of endometrial polyp, dilation and curettage.  The patient had a retained IUD that was not removable in the office setting.  The endometrial polyp was an incidental finding and was removed at the time of her procedure.  Pathology report showed a benign endometrial polyp.  No bleeding since surgery.   Some hot flashes post surgery.   Had Covid and the flu after surgery.   Sees psychiatry.  Back on Adderall.   Taking Paxil and Wellbutrin.   GYNECOLOGIC HISTORY: No LMP recorded. Patient is perimenopausal. Contraception:  n/a Menopausal hormone therapy:  n/a Last mammogram:  07/01/21, Breast Composition Category B, BI-RADS CATEGORY 1 Negative  Last pap smear:   03/01/18 negative: HR HPV negative, 05/18/13 negative: HR HPV negative         OB History     Gravida  2   Para  1   Term  1   Preterm      AB  1   Living  1      SAB  1   IAB      Ectopic      Multiple      Live Births  1              Patient Active Problem List   Diagnosis Date Noted   Preventative health care 12/29/2021   History of bariatric surgery 01/21/2021   Chronic use of opiate for therapeutic purpose 07/27/2020   Acute exacerbation of chronic low back pain 12/24/2019   Binge eating disorder [id number]   Uncomplicated opioid dependence ([place]) 11/07/2019   Pharmacologic therapy 08/07/2019   Disorder of skeletal system 08/07/2019   Problems influencing health status 08/07/2019   Osteoarthritis involving multiple joints 07/20/2018   Chronic pain of left knee 04/18/2018   Pain in right knee 01/09/2018   Pain in left knee 01/09/2018   DDD (degenerative disc disease), lumbar 04/11/2017   DDD (degenerative disc disease), cervical 04/11/2017   Well woman exam 09/15/2016   Chronic pain  syndrome 01/30/2016   Morbid obesity with BMI of 40.0-44.9, adult ([place]) 01/30/2016   Muscle spasm, nocturnal 08/05/2015   Musculoskeletal pain 08/05/2015   Carpal tunnel syndrome (Bilateral) (L>R) 08/05/2015   Cervical facet syndrome (Bilateral) (L>R) 08/05/2015   History of TIA (transient ischemic attack) 06/12/2015   Cervical spondylosis (C5-6 & C6-7 DDD) 03/04/2015   Cervical (3 mm) Grade 1 Anterolisthesis of C4 over C5 03/04/2015   Chronic neck pain (3ry area of Pain) (Bilateral) (L>R) 03/04/2015   Chronic cervical radicular pain (Bilateral) (L>R) 03/04/2015   Cervical foraminal stenosis (multilevel) (Bilateral) 03/04/2015   Long term current use of opiate analgesic 02/26/2015   Encounter for therapeutic drug level monitoring 02/26/2015   Encounter for chronic pain management 02/26/2015   Numbness of upper extremity 02/26/2015   Radiculitis involving upper extremity 02/26/2015   Chronic low back pain (1ry area of Pain) (Bilateral) (L>R) 02/26/2015   Lumbar spondylosis (Bulging Disc & Severe Left Foraminal Stenosis at L3-4) 02/26/2015   Chronic lower extremity pain (2ry area of Pain) (Left) 02/26/2015   Lumbar foraminal stenosis (Severe Left L3-4) 02/26/2015   Lumbar facet syndrome (Bilateral) (L>R) 02/26/2015   Opiate use (10 MME/Day) 12/02/2014   Long term prescription opiate use 12/02/2014  IUD (intrauterine device) in place 10/01/2014   GERD (gastroesophageal reflux disease) 01/30/2014   Overweight(278.02) 02/14/2012   Hypertension 07/09/2011   HYPERTRIGLYCERIDEMIA 12/27/2008   GOITER, NONTOXIC MULTINODULAR 12/14/2006   Prediabetes 12/14/2006   Polycystic ovaries [id number]   Metabolic Syndrome X [id number]   OBESITY 12/14/2006   Allergic rhinitis 12/14/2006   Asthma 12/14/2006   Hirsutism 12/14/2006   Generalized anxiety disorder 12/14/2006    Past Medical History:  Diagnosis Date   Bulging lumbar disc (L3-4) 02/26/2015   Chronic pain syndrome    followed by pain  clinic;    back, neck, lower extremities   DDD (degenerative disc disease), cervical    DDD (degenerative disc disease), lumbar    GAD (generalized anxiety disorder)    GERD (gastroesophageal reflux disease)    History of transient ischemic attack (TIA) 06/11/2015   admission in epic--  (02-10-2022 per pt no resiudal's and no s&s since)   Hypertension    MDD (major depressive disorder)    Mild intermittent asthma    followed  by pcp   PCOS (polycystic ovarian syndrome)    Pre-diabetes    Retained intrauterine contraceptive device (IUD)    Seasonal allergic rhinitis    Wears contact lenses     Past Surgical History:  Procedure Laterality Date   CHOLECYSTECTOMY, LAPAROSCOPIC  11/22/1999   '@[facility]'$  by dr d. Ninfa Linden   DILATATION & CURETTAGE/HYSTEROSCOPY WITH MYOSURE N/A 09/10/2014   Procedure: DILATATION & CURETTAGE/HYSTEROSCOPY WITH MYOSURE/INSERTION OF IUD;  Surgeon: Terrance Mass, MD;  Location: [facility];  Service: Gynecology;  Laterality: N/A;   HYSTEROSCOPY N/A 02/11/2022   Procedure: HYSTEROSCOPY WITH MYOSURE RESECTION OF ENDOMETRIAL POLYP, DILATION AND CURETTAGE;  Surgeon: Nunzio Cobbs, MD;  Location: [facility];  Service: Gynecology;  Laterality: N/A;   IUD REMOVAL N/A 02/11/2022   Procedure: INTRAUTERINE DEVICE (IUD) REMOVAL;  Surgeon: Nunzio Cobbs, MD;  Location: [facility];  Service: Gynecology;  Laterality: N/A;   LAPAROSCOPIC GASTRIC BANDING  04/2012   WISDOM TOOTH EXTRACTION      Current Outpatient Medications  Medication Sig Dispense Refill   albuterol (VENTOLIN HFA) 108 (90 Base) MCG/ACT inhaler Inhale 2 puffs into the lungs every 6 (six) hours as needed for wheezing or shortness of breath. (Patient taking differently: Inhale 2 puffs into the lungs every 6 (six) hours as needed for wheezing or shortness of breath.) 18 g 0   amphetamine-dextroamphetamine (ADDERALL XR) 20 MG 24 hr capsule Take 20 mg by mouth daily.      atorvastatin (LIPITOR) 10 MG tablet TAKE 1 TABLET(10 MG) BY MOUTH DAILY FOR CHOLESTEROL (Patient taking differently: Take 10 mg by mouth daily. TAKE 1 TABLET(10 MG) BY MOUTH DAILY for cholesterol.) 90 tablet 3   buPROPion (WELLBUTRIN XL) 300 MG 24 hr tablet Take 300 mg by mouth daily.     CALCIUM-MAGNESIUM-VITAMIN D ER PO Take 2 tablets by mouth daily.     cetirizine (ZYRTEC) 10 MG tablet Take 10 mg by mouth at bedtime.     famotidine (PEPCID AC) 10 MG tablet Take 10 mg by mouth as needed for heartburn or indigestion.     fluticasone (FLONASE) 50 MCG/ACT nasal spray SHAKE LIQUID AND USE 1 SPRAY IN EACH NOSTRIL TWICE DAILY AS NEEDED FOR ALLERGIES OR RHINITIS (Patient taking differently: Place 1 spray into both nostrils 2 (two) times daily as needed.) 16 g 3   HYDROcodone-acetaminophen (NORCO/VICODIN) 5-325 MG tablet Take 0.5-1 tablets by mouth 2 (  two) times daily as needed for severe pain. Must last 30 days 60 tablet 0   [START ON 05/07/2022] HYDROcodone-acetaminophen (NORCO/VICODIN) 5-325 MG tablet Take 0.5-1 tablets by mouth 2 (two) times daily as needed for severe pain. Must last 30 days 60 tablet 0   hydrocortisone cream 0.5 % Apply 1 Application topically as needed for itching.     ibuprofen (ADVIL) 800 MG tablet Take 1 tablet (800 mg total) by mouth every 8 (eight) hours as needed. Takes as needed for pain. 30 tablet 0   lamoTRIgine (LAMICTAL) 200 MG tablet Take 300 mg by mouth at bedtime.      LORazepam (ATIVAN) 1 MG tablet Take 0.5-1 mg by mouth every 8 (eight) hours as needed for anxiety or sleep. Per pt takes one nightly regularly     metFORMIN (GLUCOPHAGE-XR) 500 MG 24 hr tablet TAKE 1 TABLET(500 MG) BY MOUTH DAILY WITH BREAKFAST (Patient taking differently: Take 500 mg by mouth daily with breakfast.) 90 tablet 3   montelukast (SINGULAIR) 10 MG tablet TAKE 1 TABLET(10 MG) BY MOUTH AT BEDTIME FOR ALLERGIES 90 tablet 2   naloxone (NARCAN) nasal spray 4 mg/0.1 mL Place 1 spray into the nose as  needed for up to 365 doses (for opioid-induced respiratory depresssion). In case of emergency (overdose), spray once into each nostril. If no response within 3 minutes, repeat application and call [phone]. 1 each 0   PARoxetine (PAXIL-CR) 37.5 MG 24 hr tablet Take 2 tablets by mouth at bedtime.     RA ASPIRIN EC ADULT LOW ST 81 MG EC tablet take 1 tablet by mouth once daily (Patient taking differently: Take 81 mg by mouth daily.) 30 tablet 0   spironolactone (ALDACTONE) 25 MG tablet TAKE 1 TABLET(25 MG) BY MOUTH DAILY (Patient taking differently: Take 25 mg by mouth daily. TAKE 1 TABLET(25 MG) BY MOUTH DAILY) 90 tablet 3   HYDROcodone-acetaminophen (NORCO/VICODIN) 5-325 MG tablet Take 0.5-1 tablets by mouth 2 (two) times daily as needed for severe pain. Must last 30 days 60 tablet 0   No current facility-administered medications for this visit.     ALLERGIES: Oxycodone, Penicillins, Red dye, and Sulfa antibiotics  Family History  Problem Relation Age of Onset   Cancer Mother        thyroid cancer   Hypertension Mother    Hypertension Father    Diabetes Father    Asthma Maternal Grandmother    Stroke Maternal Grandmother    Stroke Maternal Grandfather     Social History   Socioeconomic History   Marital status: Married    Spouse name: Not on file   Number of children: Not on file   Years of education: Not on file   Highest education level: Not on file  Occupational History   Not on file  Tobacco Use   Smoking status: Never   Smokeless tobacco: Never  Vaping Use   Vaping Use: Never used  Substance and Sexual Activity   Alcohol use: Never   Drug use: Never   Sexual activity: Yes    Comment: 1st intercourse 57 yo-Fewer than 5 partners-Mirena  inserted 09-2014  Other Topics Concern   Not on file  Social History Narrative   Not on file   Social Determinants of Health   Financial Resource Strain: Not on file  Food Insecurity: Not on file  Transportation Needs: Not on file   Physical Activity: Not on file  Stress: Not on file  Social Connections: Not on file  Intimate Partner Violence: Not on file    Review of Systems  All other systems reviewed and are negative.   PHYSICAL EXAMINATION:    BP 138/84 (BP Location: Left Arm, Patient Position: Sitting, Cuff Size: Large)   Pulse 73   Ht '5\' 3"'$  (1.6 m)   Wt 234 lb (106.1 kg)   SpO2 98%   BMI 41.45 kg/m     General appearance: alert, cooperative and appears stated age  Pelvic: External genitalia:  no lesions              Urethra:  normal appearing urethra with no masses, tenderness or lesions              Bartholins and Skenes: normal                 Vagina: normal appearing vagina with normal color and discharge, no lesions              Cervix: no lesions                Bimanual Exam:  Uterus:  normal size, contour, position, consistency, mobility, non-tender              Adnexa: no mass, fullness, tenderness              Chaperone was present for exam:  Raquel Sarna  ASSESSMENT  Status post  Hysteroscopy with IUD removal, Myosure resection of endometrial polyp, dilation and curettage.  Doing well post op.  Hot flashes.   PLAN  Surgical findings, procedure, and pathology report reviewed with patient.  She will call for any future vaginal bleeding.  We discussed the benefit of Paxil for treating vasomotor symptoms.  Fu for annual exam and prn.   An After Visit Summary was printed and given to the patient.

## 2022-04-26 ENCOUNTER — Ambulatory Visit (INDEPENDENT_AMBULATORY_CARE_PROVIDER_SITE_OTHER): Payer: Managed Care, Other (non HMO) | Admitting: Obstetrics and Gynecology

## 2022-04-26 ENCOUNTER — Encounter: Payer: Self-pay | Admitting: Obstetrics and Gynecology

## 2022-04-26 VITALS — BP 138/84 | HR 73 | Ht 63.0 in | Wt 234.0 lb

## 2022-04-26 DIAGNOSIS — Z9889 Other specified postprocedural states: Secondary | ICD-10-CM

## 2022-05-07 ENCOUNTER — Other Ambulatory Visit: Payer: Self-pay | Admitting: Primary Care

## 2022-05-07 DIAGNOSIS — J309 Allergic rhinitis, unspecified: Secondary | ICD-10-CM

## 2022-05-27 ENCOUNTER — Other Ambulatory Visit: Payer: Self-pay | Admitting: Primary Care

## 2022-05-27 DIAGNOSIS — M62838 Other muscle spasm: Secondary | ICD-10-CM

## 2022-05-27 DIAGNOSIS — M7918 Myalgia, other site: Secondary | ICD-10-CM

## 2022-05-27 NOTE — Telephone Encounter (Signed)
Please call patient:  Received refill request for metaxalone muscle relaxer from the pharmacy.  It looks like a CMA from another doctor's office discontinue this medication.  Is she still taking this?  If so, how many a day?

## 2022-05-28 NOTE — Telephone Encounter (Signed)
Unable to reach patient. Left voicemail to return call to our office.   

## 2022-05-28 NOTE — Telephone Encounter (Signed)
Patient called back in and states she only uses this on occasion when her back pain flares up. At most she takes 1 in the morning and 1 in the evening. She doesn't know why it was discontinued off of her list.

## 2022-05-30 NOTE — Telephone Encounter (Signed)
Noted. Refill(s) sent to pharmacy.  

## 2022-06-01 NOTE — Patient Instructions (Signed)
____________________________________________________________________________________________  Opioid Pain Medication Update  To: All patients taking opioid pain medications. (I.e.: hydrocodone, hydromorphone, oxycodone, oxymorphone, morphine, codeine, methadone, tapentadol, tramadol, buprenorphine, fentanyl, etc.)  Re: Updated review of side effects and adverse reactions of opioid analgesics, as well as new information about long term effects of this class of medications.  Direct risks of long-term opioid therapy are not limited to opioid addiction and overdose. Potential medical risks include serious fractures, breathing problems during sleep, hyperalgesia, immunosuppression, chronic constipation, bowel obstruction, myocardial infarction, and tooth decay secondary to xerostomia.  Unpredictable adverse effects that can occur even if you take your medication correctly: Cognitive impairment, respiratory depression, and death. Most people think that if they take their medication "correctly", and "as instructed", that they will be safe. Nothing could be farther from the truth. In reality, a significant amount of recorded deaths associated with the use of opioids has occurred in individuals that had taken the medication for a long time, and were taking their medication correctly. The following are examples of how this can happen: Patient taking his/her medication for a long time, as instructed, without any side effects, is given a certain antibiotic or another unrelated medication, which in turn triggers a "Drug-to-drug interaction" leading to disorientation, cognitive impairment, impaired reflexes, respiratory depression or an untoward event leading to serious bodily harm or injury, including death.  Patient taking his/her medication for a long time, as instructed, without any side effects, develops an acute impairment of liver and/or kidney function. This will lead to a rapid inability of the body to  breakdown and eliminate their pain medication, which will result in effects similar to an "overdose", but with the same medicine and dose that they had always taken. This again may lead to disorientation, cognitive impairment, impaired reflexes, respiratory depression or an untoward event leading to serious bodily harm or injury, including death.  A similar problem will occur with patients as they grow older and their liver and kidney function begins to decrease as part of the aging process.  Background information: Historically, the original case for using long-term opioid therapy to treat chronic noncancer pain was based on safety assumptions that subsequent experience has called into question. In 1996, the American Pain Society and the American Academy of Pain Medicine issued a consensus statement supporting long-term opioid therapy. This statement acknowledged the dangers of opioid prescribing but concluded that the risk for addiction was low; respiratory depression induced by opioids was short-lived, occurred mainly in opioid-naive patients, and was antagonized by pain; tolerance was not a common problem; and efforts to control diversion should not constrain opioid prescribing. This has now proven to be wrong. Experience regarding the risks for opioid addiction, misuse, and overdose in community practice has failed to support these assumptions.  According to the Centers for Disease Control and Prevention, fatal overdoses involving opioid analgesics have increased sharply over the past decade. Currently, more than 96,700 people die from drug overdoses every year. Opioids are a factor in 7 out of every 10 overdose deaths. Deaths from drug overdose have surpassed motor vehicle accidents as the leading cause of death for individuals between the ages of 35 and 54.  Clinical data suggest that neuroendocrine dysfunction may be very common in both men and women, potentially causing hypogonadism, erectile  dysfunction, infertility, decreased libido, osteoporosis, and depression. Recent studies linked higher opioid dose to increased opioid-related mortality. Controlled observational studies reported that long-term opioid therapy may be associated with increased risk for cardiovascular events. Subsequent meta-analysis concluded   that the safety of long-term opioid therapy in elderly patients has not been proven.   Side Effects and adverse reactions: Common side effects: Drowsiness (sedation). Dizziness. Nausea and vomiting. Constipation. Physical dependence -- Dependence often manifests with withdrawal symptoms when opioids are discontinued or decreased. Tolerance -- As you take repeated doses of opioids, you require increased medication to experience the same effect of pain relief. Respiratory depression -- This can occur in healthy people, especially with higher doses. However, people with COPD, asthma or other lung conditions may be even more susceptible to fatal respiratory impairment.  Uncommon side effects: An increased sensitivity to feeling pain and extreme response to pain (hyperalgesia). Chronic use of opioids can lead to this. Delayed gastric emptying (the process by which the contents of your stomach are moved into your small intestine). Muscle rigidity. Immune system and hormonal dysfunction. Quick, involuntary muscle jerks (myoclonus). Arrhythmia. Itchy skin (pruritus). Dry mouth (xerostomia).  Long-term side effects: Chronic constipation. Sleep-disordered breathing (SDB). Increased risk of bone fractures. Hypothalamic-pituitary-adrenal dysregulation. Increased risk of overdose.  RISKS: Fractures and Falls:  Opioids increase the risk and incidence of falls. This is of particular importance in elderly patients.  Endocrine System:  Long-term administration is associated with endocrine abnormalities (endocrinopathies). (Also known as Opioid-induced Endocrinopathy) Influences  on both the hypothalamic-pituitary-adrenal axis?and the hypothalamic-pituitary-gonadal axis have been demonstrated with consequent hypogonadism and adrenal insufficiency in both sexes. Hypogonadism and decreased levels of dehydroepiandrosterone sulfate have been reported in men and women. Endocrine effects include: Amenorrhoea in women (abnormal absence of menstruation) Reduced libido in both sexes Decreased sexual function Erectile dysfunction in men Hypogonadisms (decreased testicular function with shrinkage of testicles) Infertility Depression and fatigue Loss of muscle mass Anxiety Depression Immune suppression Hyperalgesia Weight gain Anemia Osteoporosis Patients (particularly women of childbearing age) should avoid opioids. There is insufficient evidence to recommend routine monitoring of asymptomatic patients taking opioids in the long-term for hormonal deficiencies.  Immune System: Human studies have demonstrated that opioids have an immunomodulating effect. These effects are mediated via opioid receptors both on immune effector cells and in the central nervous system. Opioids have been demonstrated to have adverse effects on antimicrobial response and anti-tumour surveillance. Buprenorphine has been demonstrated to have no impact on immune function.  Opioid Induced Hyperalgesia: Human studies have demonstrated that prolonged use of opioids can lead to a state of abnormal pain sensitivity, sometimes called opioid induced hyperalgesia (OIH). Opioid induced hyperalgesia is not usually seen in the absence of tolerance to opioid analgesia. Clinically, hyperalgesia may be diagnosed if the patient on long-term opioid therapy presents with increased pain. This might be qualitatively and anatomically distinct from pain related to disease progression or to breakthrough pain resulting from development of opioid tolerance. Pain associated with hyperalgesia tends to be more diffuse than the  pre-existing pain and less defined in quality. Management of opioid induced hyperalgesia requires opioid dose reduction.  Cancer: Chronic opioid therapy has been associated with an increased risk of cancer among noncancer patients with chronic pain. This association was more evident in chronic strong opioid users. Chronic opioid consumption causes significant pathological changes in the small intestine and colon. Epidemiological studies have found that there is a link between opium dependence and initiation of gastrointestinal cancers. Cancer is the second leading cause of death after cardiovascular disease. Chronic use of opioids can cause multiple conditions such as GERD, immunosuppression and renal damage as well as carcinogenic effects, which are associated with the incidence of cancers.   Mortality: Long-term opioid use   has been associated with increased mortality among patients with chronic non-cancer pain (CNCP).  Prescription of long-acting opioids for chronic noncancer pain was associated with a significantly increased risk of all-cause mortality, including deaths from causes other than overdose.  Reference: Von Korff M, Kolodny A, Deyo RA, Chou R. Long-term opioid therapy reconsidered. Ann Intern Med. 2011 Sep 6;155(5):325-8. doi: 10.7326/[id number]. PMID: 21893626; PMCID: PMC3280085. Bedson J, Chen Y, Ashworth J, Hayward RA, Dunn KM, Jordan KP. Risk of adverse events in patients prescribed long-term opioids: A cohort study in the [place] Clinical Practice Research Datalink. Eur J Pain. 2019 May;23(5):908-922. doi: 10.1002/ejp.1357. Epub 2019 Jan 31. PMID: 30620116. Colameco S, Coren JS, Ciervo CA. Continuous opioid treatment for chronic noncancer pain: a time for moderation in prescribing. Postgrad Med. 2009 Jul;121(4):61-6. doi: 10.3810/pgm.2009.07.2032. PMID: 19641271. Chou R, Turner JA, Devine EB, Hansen RN, Sullivan SD, Blazina I, Dana T, Bougatsos C, Deyo RA. The  effectiveness and risks of long-term opioid therapy for chronic pain: a systematic review for a National Institutes of Health Pathways to Prevention Workshop. Ann Intern Med. 2015 Feb 17;162(4):276-86. doi: 10.7326/M14-2559. PMID: 25581257. Warner M, Chen LH, Makuc DM. NCHS Data Brief No. 22. [place]: Centers for Disease Control and Prevention; 2009. Sep, Increase in Fatal Poisonings Involving Opioid Analgesics in the [place], 1999-2006. Song IA, Choi HR, Oh TK. Long-term opioid use and mortality in patients with chronic non-cancer pain: Ten-year follow-up study in [name] from 2010 through 2019. EClinicalMedicine. 2022 Jul 18;51:101558. doi: 10.1016/j.eclinm.2022.101558. PMID: 35875817; PMCID: PMC9304910. Huser, W., Schubert, T., Vogelmann, T. et al. All-cause mortality in patients with long-term opioid therapy compared with non-opioid analgesics for chronic non-cancer pain: a database study. BMC Med 18, 162 (2020). https://doi.org/10.1186/[email or web address] Rashidian H, Zendehdel K, Kamangar F, Malekzadeh R, Haghdoost AA. An Ecological Study of the Association between Opiate Use and Incidence of Cancers. Addict Health. 2016 Fall;8(4):252-260. PMID: 28819556; PMCID: PMC5554805.  Our Goal: Our goal is to control your pain with means other than the use of opioid pain medications.  Our Recommendation: Talk to your physician about coming off of these medications. We can assist you with the tapering down and stopping these medicines. Based on the new information, even if you cannot completely stop the medication, a decrease in the dose may be associated with a lesser risk. Ask for other means of controlling the pain. Decrease or eliminate those factors that significantly contribute to your pain such as smoking, obesity, and a diet heavily tilted towards "inflammatory" nutrients.  Last Updated: 04/21/2022    ____________________________________________________________________________________________     ____________________________________________________________________________________________  Patient Information update  To: All of our patients.  Re: Name change.  It has been made official that our current name, "[facility]"   will soon be changed to "[facility]".   The purpose of this change is to eliminate any confusion created by the concept of our practice being a "Medication Management Pain Clinic". In the past this has led to the misconception that we treat pain primarily by the use of prescription medications.  Nothing can be farther from the truth.   Understanding PAIN MANAGEMENT: To further understand what our practice does, you first have to understand that "Pain Management" is a subspecialty that requires additional training once a physician has completed their specialty training, which can be in either Anesthesia, Neurology, Psychiatry, or Physical Medicine and Rehabilitation (PMR). Each one of these contributes to the final approach taken by each physician to   the management of their patient's pain. To be a "Pain Management Specialist" you must have first completed one of the specialty trainings below.  Anesthesiologists - trained in clinical pharmacology and interventional techniques such as nerve blockade and regional as well as central neuroanatomy. They are trained to block pain before, during, and after surgical interventions.  Neurologists - trained in the diagnosis and pharmacological treatment of complex neurological conditions, such as Multiple Sclerosis, Parkinson's, spinal cord injuries, and other systemic conditions that may be associated with symptoms that may include but are not limited to pain. They tend to rely primarily on the treatment of chronic pain  using prescription medications.  Psychiatrist - trained in conditions affecting the psychosocial wellbeing of patients including but not limited to depression, anxiety, schizophrenia, personality disorders, addiction, and other substance use disorders that may be associated with chronic pain. They tend to rely primarily on the treatment of chronic pain using prescription medications.   Physical Medicine and Rehabilitation (PMR) physicians, also known as physiatrists - trained to treat a wide variety of medical conditions affecting the brain, spinal cord, nerves, bones, joints, ligaments, muscles, and tendons. Their training is primarily aimed at treating patients that have suffered injuries that have caused severe physical impairment. Their training is primarily aimed at the physical therapy and rehabilitation of those patients. They may also work alongside orthopedic surgeons or neurosurgeons using their expertise in assisting surgical patients to recover after their surgeries.  INTERVENTIONAL PAIN MANAGEMENT is sub-subspecialty of Pain Management.  Our physicians are Board-certified in Anesthesia, Pain Management, and Interventional Pain Management.  This meaning that not only have they been trained and Board-certified in their specialty of Anesthesia, and subspecialty of Pain Management, but they have also received further training in the sub-subspecialty of Interventional Pain Management, in order to become Board-certified as INTERVENTIONAL PAIN MANAGEMENT SPECIALIST.    Mission: Our goal is to use our skills in  INTERVENTIONAL PAIN MANAGEMENT as alternatives to the chronic use of prescription opioid medications for the treatment of pain. To make this more clear, we have changed our name to reflect what we do and offer. We will continue to offer medication management assessment and recommendations, but we will not be taking over any patient's medication  management.  ____________________________________________________________________________________________     ____________________________________________________________________________________________  National Pain Medication Shortage  The [place] is experiencing worsening drug shortages. These have had a negative widespread effect on patient care and treatment. Not expected to improve any time soon. Predicted to last past 2029.   Drug shortage list (generic names) Oxycodone IR Oxycodone/APAP Oxymorphone IR Hydromorphone Hydrocodone/APAP Morphine  Where is the problem?  Manufacturing and supply level.  Will this shortage affect you?  Only if you take any of the above pain medications.  How? You may be unable to fill your prescription.  Your pharmacist may offer a "partial fill" of your prescription. (Warning: Do not accept partial fills.) Prescriptions partially filled cannot be transferred to another pharmacy. Read our Medication Rules and Regulation. Depending on how much medicine you are dependent on, you may experience withdrawals when unable to get the medication.  Recommendations: Consider ending your dependence on opioid pain medications. Ask your pain specialist to assist you with the process. Consider switching to a medication currently not in shortage, such as Buprenorphine. Talk to your pain specialist about this option. Consider decreasing your pain medication requirements by managing tolerance thru "Drug Holidays". This may help minimize withdrawals, should you run out of medicine. Control your pain thru   the use of non-pharmacological interventional therapies.   Your prescriber: Prescribers cannot be blamed for shortages. Medication manufacturing and supply issues cannot be fixed by the prescriber.   NOTE: The prescriber is not responsible for supplying the medication, or solving supply issues. Work with your pharmacist to solve it. The patient is responsible for  the decision to take or continue taking the medication and for identifying and securing a legal supply source. By law, supplying the medication is the job and responsibility of the pharmacy. The prescriber is responsible for the evaluation, monitoring, and prescribing of these medications.   Prescribers will NOT: Re-issue prescriptions that have been partially filled. Re-issue prescriptions already sent to a pharmacy.  Re-send prescriptions to a different pharmacy because yours did not have your medication. Ask pharmacist to order more medicine or transfer the prescription to another pharmacy. (Read below.)  New 2023 regulation: "October 23, 2021 Revised Regulation Allows DEA-Registered Pharmacies to Transfer Electronic Prescriptions at a Patient's Request DEA Headquarters Division - Public Information Office Patients now have the ability to request their electronic prescription be transferred to another pharmacy without having to go back to their practitioner to initiate the request. This revised regulation went into effect on Monday, October 19, 2021.     At a patient's request, a DEA-registered retail pharmacy can now transfer an electronic prescription for a controlled substance (schedules II-V) to another DEA-registered retail pharmacy. Prior to this change, patients would have to go through their practitioner to cancel their prescription and have it re-issued to a different pharmacy. The process was taxing and time consuming for both patients and practitioners.    The Drug Enforcement Administration (DEA) published its intent to revise the process for transferring electronic prescriptions on January 11, 2020.  The final rule was published in the federal register on September 17, 2021 and went into effect 30 days later.  Under the final rule, a prescription can only be transferred once between pharmacies, and only if allowed under existing state or other applicable law. The prescription must  remain in its electronic form; may not be altered in any way; and the transfer must be communicated directly between two licensed pharmacists. It's important to note, any authorized refills transfer with the original prescription, which means the entire prescription will be filled at the same pharmacy".  Reference: https://www.dea.gov/stories/2023/2023-10/2021-09-01/revised-regulation-allows-dea-registered-pharmacies-transfer (DEA website announcement)  https://www.govinfo.gov/content/pkg/FR-2021-09-17/pdf/2023-15847.pdf (Federal Register  Department of Justice)   Federal Register / Vol. 88, No. 143 / Thursday, September 17, 2021 / Rules and Regulations DEPARTMENT OF JUSTICE  Drug Enforcement Administration  21 CFR Part 1306  [Docket No. [id number]]  RIN [id number]-AB64 Transfer of Electronic Prescriptions for Schedules II-V Controlled Substances Between Pharmacies for Initial Filling  ____________________________________________________________________________________________     ____________________________________________________________________________________________  Transfer of Pain Medication between Pharmacies  Re: 2023 DEA Clarification on existing regulation  Published on DEA Website: October 23, 2021  Title: Revised Regulation Allows DEA-Registered Pharmacies to Transfer Electronic Prescriptions at a Patient's Request DEA Headquarters Division - Public Information Office  "Patients now have the ability to request their electronic prescription be transferred to another pharmacy without having to go back to their practitioner to initiate the request. This revised regulation went into effect on Monday, October 19, 2021.     At a patient's request, a DEA-registered retail pharmacy can now transfer an electronic prescription for a controlled substance (schedules II-V) to another DEA-registered retail pharmacy. Prior to this change, patients would have to go through their practitioner to  cancel their prescription   and have it re-issued to a different pharmacy. The process was taxing and time consuming for both patients and practitioners.    The Drug Enforcement Administration (DEA) published its intent to revise the process for transferring electronic prescriptions on January 11, 2020.  The final rule was published in the federal register on September 17, 2021 and went into effect 30 days later.  Under the final rule, a prescription can only be transferred once between pharmacies, and only if allowed under existing state or other applicable law. The prescription must remain in its electronic form; may not be altered in any way; and the transfer must be communicated directly between two licensed pharmacists. It's important to note, any authorized refills transfer with the original prescription, which means the entire prescription will be filled at the same pharmacy."    REFERENCES: 1. DEA website announcement https://www.dea.gov/stories/2023/2023-10/2021-09-01/revised-regulation-allows-dea-registered-pharmacies-transfer  2. Department of Justice website  https://www.govinfo.gov/content/pkg/FR-2021-09-17/pdf/2023-15847.pdf  3. DEPARTMENT OF JUSTICE Drug Enforcement Administration 21 CFR Part 1306 [Docket No. [id number]] RIN [id number] "Transfer of Electronic Prescriptions for Schedules II-V Controlled Substances Between Pharmacies for Initial Filling"  ____________________________________________________________________________________________     _______________________________________________________________________  Medication Rules  Purpose: To inform patients, and their family members, of our medication rules and regulations.  Applies to: All patients receiving prescriptions from our practice (written or electronic).  Pharmacy of record: This is the pharmacy where your electronic prescriptions will be sent. Make sure we have the correct one.  Electronic prescriptions: In  compliance with the [place] Strengthen Opioid Misuse Prevention (STOP) Act of 2017 (Session Law 2017-74/H243), effective February 22, 2018, all controlled substances must be electronically prescribed. Written prescriptions, faxing, or calling prescriptions to a pharmacy will no longer be done.  Prescription refills: These will be provided only during in-person appointments. No medications will be renewed without a "face-to-face" evaluation with your provider. Applies to all prescriptions.  NOTE: The following applies primarily to controlled substances (Opioid* Pain Medications).   Type of encounter (visit): For patients receiving controlled substances, face-to-face visits are required. (Not an option and not up to the patient.)  Patient's responsibilities: Pain Pills: Bring all pain pills to every appointment (except for procedure appointments). Pill Bottles: Bring pills in original pharmacy bottle. Bring bottle, even if empty. Always bring the bottle of the most recent fill.  Medication refills: You are responsible for knowing and keeping track of what medications you are taking and when is it that you will need a refill. The day before your appointment: write a list of all prescriptions that need to be refilled. The day of the appointment: give the list to the admitting nurse. Prescriptions will be written only during appointments. No prescriptions will be written on procedure days. If you forget a medication: it will not be "Called in", "Faxed", or "electronically sent". You will need to get another appointment to get these prescribed. No early refills. Do not call asking to have your prescription filled early. Partial  or short prescriptions: Occasionally your pharmacy may not have enough pills to fill your prescription.  NEVER ACCEPT a partial fill or a prescription that is short of the total amount of pills that you were prescribed.  With controlled substances the law allows 72 hours for  the pharmacy to complete the prescription.  If the prescription is not completed within 72 hours, the pharmacist will require a new prescription to be written. This means that you will be short on your medicine and we WILL NOT send another prescription to complete your original   prescription.  Instead, request the pharmacy to send a carrier to a nearby branch to get enough medication to provide you with your full prescription. Prescription Accuracy: You are responsible for carefully inspecting your prescriptions before leaving our office. Have the discharge nurse carefully go over each prescription with you, before taking them home. Make sure that your name is accurately spelled, that your address is correct. Check the name and dose of your medication to make sure it is accurate. Check the number of pills, and the written instructions to make sure they are clear and accurate. Make sure that you are given enough medication to last until your next medication refill appointment. Taking Medication: Take medication as prescribed. When it comes to controlled substances, taking less pills or less frequently than prescribed is permitted and encouraged. Never take more pills than instructed. Never take the medication more frequently than prescribed.  Inform other Doctors: Always inform, all of your healthcare providers, of all the medications you take. Pain Medication from other Providers: You are not allowed to accept any additional pain medication from any other Doctor or Healthcare provider. There are two exceptions to this rule. (see below) In the event that you require additional pain medication, you are responsible for notifying us, as stated below. Cough Medicine: Often these contain an opioid, such as codeine or hydrocodone. Never accept or take cough medicine containing these opioids if you are already taking an opioid* medication. The combination may cause respiratory failure and death. Medication Agreement:  You are responsible for carefully reading and following our Medication Agreement. This must be signed before receiving any prescriptions from our practice. Safely store a copy of your signed Agreement. Violations to the Agreement will result in no further prescriptions. (Additional copies of our Medication Agreement are available upon request.) Laws, Rules, & Regulations: All patients are expected to follow all Federal and State Laws, Statutes, Rules, & Regulations. Ignorance of the Laws does not constitute a valid excuse.  Illegal drugs and Controlled Substances: The use of illegal substances (including, but not limited to marijuana and its derivatives) and/or the illegal use of any controlled substances is strictly prohibited. Violation of this rule may result in the immediate and permanent discontinuation of any and all prescriptions being written by our practice. The use of any illegal substances is prohibited. Adopted CDC guidelines & recommendations: Target dosing levels will be at or below 60 MME/day. Use of benzodiazepines** is not recommended.  Exceptions: There are only two exceptions to the rule of not receiving pain medications from other Healthcare Providers. Exception #1 (Emergencies): In the event of an emergency (i.e.: accident requiring emergency care), you are allowed to receive additional pain medication. However, you are responsible for: As soon as you are able, call our office [phone], at any time of the day or night, and leave a message stating your name, the date and nature of the emergency, and the name and dose of the medication prescribed. In the event that your call is answered by a member of our staff, make sure to document and save the date, time, and the name of the person that took your information.  Exception #2 (Planned Surgery): In the event that you are scheduled by another doctor or dentist to have any type of surgery or procedure, you are allowed (for a period no  longer than 30 days), to receive additional pain medication, for the acute post-op pain. However, in this case, you are responsible for picking up a copy of   our "Post-op Pain Management for Surgeons" handout, and giving it to your surgeon or dentist. This document is available at our office, and does not require an appointment to obtain it. Simply go to our office during business hours (Monday-Thursday from 8:00 AM to 4:00 PM) (Friday 8:00 AM to 12:00 Noon) or if you have a scheduled appointment with us, prior to your surgery, and ask for it by name. In addition, you are responsible for: calling our office [phone], at any time of the day or night, and leaving a message stating your name, name of your surgeon, type of surgery, and date of procedure or surgery. Failure to comply with your responsibilities may result in termination of therapy involving the controlled substances. Medication Agreement Violation. Following the above rules, including your responsibilities will help you in avoiding a Medication Agreement Violation ("Breaking your Pain Medication Contract").  Consequences:  Not following the above rules may result in permanent discontinuation of medication prescription therapy.  *Opioid medications include: morphine, codeine, oxycodone, oxymorphone, hydrocodone, hydromorphone, meperidine, tramadol, tapentadol, buprenorphine, fentanyl, methadone. **Benzodiazepine medications include: diazepam (Valium), alprazolam (Xanax), clonazepam (Klonopine), lorazepam (Ativan), clorazepate (Tranxene), chlordiazepoxide (Librium), estazolam (Prosom), oxazepam (Serax), temazepam (Restoril), triazolam (Halcion) (Last updated: 12/15/2021) ______________________________________________________________________    ______________________________________________________________________  Medication Recommendations and Reminders  Applies to: All patients receiving prescriptions (written and/or  electronic).  Medication Rules & Regulations: You are responsible for reading, knowing, and following our "Medication Rules" document. These exist for your safety and that of others. They are not flexible and neither are we. Dismissing or ignoring them is an act of "non-compliance" that may result in complete and irreversible termination of such medication therapy. For safety reasons, "non-compliance" will not be tolerated. As with the [place] fundamental legal principle of "ignorance of the law is no defense", we will accept no excuses for not having read and knowing the content of documents provided to you by our practice.  Pharmacy of record:  Definition: This is the pharmacy where your electronic prescriptions will be sent.  We do not endorse any particular pharmacy. It is up to you and your insurance to decide what pharmacy to use.  We do not restrict you in your choice of pharmacy. However, once we write for your prescriptions, we will NOT be re-sending more prescriptions to fix restricted supply problems created by your pharmacy, or your insurance.  The pharmacy listed in the electronic medical record should be the one where you want electronic prescriptions to be sent. If you choose to change pharmacy, simply notify our nursing staff. Changes will be made only during your regular appointments and not over the phone.  Recommendations: Keep all of your pain medications in a safe place, under lock and key, even if you live alone. We will NOT replace lost, stolen, or damaged medication. We do not accept "Police Reports" as proof of medications having been stolen. After you fill your prescription, take 1 week's worth of pills and put them away in a safe place. You should keep a separate, properly labeled bottle for this purpose. The remainder should be kept in the original bottle. Use this as your primary supply, until it runs out. Once it's gone, then you know that you have 1 week's worth of medicine,  and it is time to come in for a prescription refill. If you do this correctly, it is unlikely that you will ever run out of medicine. To make sure that the above recommendation works, it is very important that you make   sure your medication refill appointments are scheduled at least 1 week before you run out of medicine. To do this in an effective manner, make sure that you do not leave the office without scheduling your next medication management appointment. Always ask the nursing staff to show you in your prescription , when your medication will be running out. Then arrange for the receptionist to get you a return appointment, at least 7 days before you run out of medicine. Do not wait until you have 1 or 2 pills left, to come in. This is very poor planning and does not take into consideration that we may need to cancel appointments due to bad weather, sickness, or emergencies affecting our staff. DO NOT ACCEPT A "Partial Fill": If for any reason your pharmacy does not have enough pills/tablets to completely fill or refill your prescription, do not allow for a "partial fill". The law allows the pharmacy to complete that prescription within 72 hours, without requiring a new prescription. If they do not fill the rest of your prescription within those 72 hours, you will need a separate prescription to fill the remaining amount, which we will NOT provide. If the reason for the partial fill is your insurance, you will need to talk to the pharmacist about payment alternatives for the remaining tablets, but again, DO NOT ACCEPT A PARTIAL FILL, unless you can trust your pharmacist to obtain the remainder of the pills within 72 hours.  Prescription refills and/or changes in medication(s):  Prescription refills, and/or changes in dose or medication, will be conducted only during scheduled medication management appointments. (Applies to both, written and electronic prescriptions.) No refills on procedure days. No  medication will be changed or started on procedure days. No changes, adjustments, and/or refills will be conducted on a procedure day. Doing so will interfere with the diagnostic portion of the procedure. No phone refills. No medications will be "called into the pharmacy". No Fax refills. No weekend refills. No Holliday refills. No after hours refills.  Remember:  Business hours are:  Monday to Thursday 8:00 AM to 4:00 PM Provider's Schedule: Hazel Wrinkle, MD - Appointments are:  Medication management: Monday and Wednesday 8:00 AM to 4:00 PM Procedure day: Tuesday and Thursday 7:30 AM to 4:00 PM Bilal Lateef, MD - Appointments are:  Medication management: Tuesday and Thursday 8:00 AM to 4:00 PM Procedure day: Monday and Wednesday 7:30 AM to 4:00 PM (Last update: 12/15/2021) ______________________________________________________________________    ____________________________________________________________________________________________  Drug Holidays  What is a "Drug Holiday"? Drug Holiday: is the name given to the process of slowly tapering down and temporarily stopping the pain medication for the purpose of decreasing or eliminating tolerance to the drug.  Benefits Improved effectiveness Decreased required effective dose Improved pain control End dependence on high dose therapy Decrease cost of therapy Uncovering "opioid-induced hyperalgesia". (OIH)  What is "opioid hyperalgesia"? It is a paradoxical increase in pain caused by exposure to opioids. Stopping the opioid pain medication, contrary to the expected, it actually decreases or completely eliminates the pain. Ref.: "A comprehensive review of opioid-induced hyperalgesia". Marion Lee, et.al. Pain Physician. 2011 Mar-Apr;14(2):145-61.  What is tolerance? Tolerance: the progressive loss of effectiveness of a pain medicine due to repetitive use. A common problem of opioid pain medications.  How long should a "Drug  Holiday" last? Effectiveness depends on the patient staying off all opioid pain medicines for a minimum of 14 consecutive days. (2 weeks)  How about just taking less of the medicine? Does not   work. Will not accomplish goal of eliminating the excess receptors.  How about switching to a different pain medicine? (AKA. "Opioid rotation") Does not work. Creates the illusion of effectiveness by taking advantage of inaccurate equivalent dose calculations between different opioids. -This "technique" was promoted by studies funded by pharmaceutical companies, such as [employer], creators of "OxyContin".  Can I stop the medicine "cold turkey"? We do not recommend it. You should always coordinate with your prescribing physician to make the transition as smoothly as possible. Avoid stopping the medicine abruptly without consulting. We recommend a "slow taper".  What is a slow taper? Taper: refers to the gradual decrease in dose.   How do I stop/taper the dose? Slowly. Decrease the daily amount of pills that you take by one (1) pill every seven (7) days. This is called a "slow downward taper". Example: if you normally take four (4) pills per day, drop it to three (3) pills per day for seven (7) days, then to two (2) pills per day for seven (7) days, then to one (1) per day for seven (7) days, and then stop the medicine. The 14 day "Drug Holiday" starts on the first day without medicine.   Will I experience withdrawals? Unlikely with a slow taper.  What triggers withdrawals? Withdrawals are triggered by the sudden/abrupt stop of high dose opioids. Withdrawals can be avoided by slowly decreasing the dose over a prolonged period of time.  What are withdrawals? Symptoms associated with sudden/abrupt reduction/stopping of high-dose, long-term use of pain medication. Withdrawal are seldom seen on low dose therapy, or patients rarely taking opioid medication.  Early Withdrawal Symptoms may  include: Agitation Anxiety Muscle aches Increased tearing Insomnia Runny nose Sweating Yawning  Late symptoms may include: Abdominal cramping Diarrhea Dilated pupils Goose bumps Nausea Vomiting  When could I see withdrawals? Onset: 8-24 hours after last use for most opioids. 12-48 hours for long-acting opioids (i.e.: methadone)  How long could they last? Duration: 4-10 days for most opioids. 14-21 days for long-acting opioids (i.e.: methadone)  What will happen after I complete my "Drug Holiday"? The need and indications for the opioid analgesic will be reviewed before restarting the medication. Dose requirements will likely decrease and the dose will need to be adjusted accordingly.   (Last update: 05/12/2022) ____________________________________________________________________________________________    ____________________________________________________________________________________________  WARNING: CBD (cannabidiol) & Delta (Delta-8 tetrahydrocannabinol) products.   Applicable to:  All individuals currently taking or considering taking CBD (cannabidiol) and, more important, all patients taking opioid analgesic controlled substances (pain medication). (Example: oxycodone; oxymorphone; hydrocodone; hydromorphone; morphine; methadone; tramadol; tapentadol; fentanyl; buprenorphine; butorphanol; dextromethorphan; meperidine; codeine; etc.)  Introduction:  Recently there has been a drive towards the use of "natural" products for the treatment of different conditions, including pain anxiety and sleep disorders. Marijuana and hemp are two varieties of the cannabis genus plants. Marijuana and its derivatives are illegal, while hemp and its derivatives are not. Cannabidiol (CBD) and tetrahydrocannabinol (THC), are two natural compounds found in plants of the Cannabis genus. They can both be extracted from hemp or marijuana. Both compounds interact with your body's endocannabinoid  system in very different ways. CBD is associated with pain relief (analgesia) while THC is associated with the psychoactive effects ("the high") obtained from the use of marijuana products. There are two main types of THC: Delta-9, which comes from the marijuana plant and it is illegal, and Delta-8, which comes from the hemp plant, and it is legal. (Both, Delta-9-THC and Delta-8-THC are psychoactive and   give you "the high".)   Legality:  Marijuana and its derivatives: illegal Hemp and its derivatives: Legal (State dependent) UPDATE: (04/10/2021) The Drug Enforcement Agency (DEA) issued a letter stating that "delta" cannabinoids, including Delta-8-THCO and Delta-9-THCO, synthetically derived from hemp do not qualify as hemp and will be viewed as Schedule I drugs. (Schedule I drugs, substances, or chemicals are defined as drugs with no currently accepted medical use and a high potential for abuse. Some examples of Schedule I drugs are: heroin, lysergic acid diethylamide (LSD), marijuana (cannabis), 3,4-methylenedioxymethamphetamine (ecstasy), methaqualone, and peyote.) (https://www.dea.gov)  Legal status of CBD in [place]:  "Conditionally Legal"  Reference: "FDA Regulation of Cannabis and Cannabis-Derived Products, Including Cannabidiol (CBD)" - https://www.fda.gov/news-events/public-health-focus/fda-regulation-cannabis-and-cannabis-derived-products-including-cannabidiol-cbd  Warning:  CBD is not FDA approved and has not undergo the same manufacturing controls as prescription drugs.  This means that the purity and safety of available CBD may be questionable. Most of the time, despite manufacturer's claims, it is contaminated with THC (delta-9-tetrahydrocannabinol - the chemical in marijuana responsible for the "HIGH").  When this is the case, the THC contaminant will trigger a positive urine drug screen (UDS) test for Marijuana (carboxy-THC).   The FDA recently put out a warning about 5 things that everyone  should be aware of regarding Delta-8 THC: Delta-8 THC products have not been evaluated or approved by the FDA for safe use and may be marketed in ways that put the public health at risk. The FDA has received adverse event reports involving delta-8 THC-containing products. Delta-8 THC has psychoactive and intoxicating effects. Delta-8 THC manufacturing often involve use of potentially harmful chemicals to create the concentrations of delta-8 THC claimed in the marketplace. The final delta-8 THC product may have potentially harmful by-products (contaminants) due to the chemicals used in the process. Manufacturing of delta-8 THC products may occur in uncontrolled or unsanitary settings, which may lead to the presence of unsafe contaminants or other potentially harmful substances. Delta-8 THC products should be kept out of the reach of children and pets.  NOTE: Because a positive UDS for any illicit substance is a violation of our medication agreement, your opioid analgesics (pain medicine) may be permanently discontinued.  MORE ABOUT CBD  General Information: CBD was discovered in 1940 and it is a derivative of the cannabis sativa genus plants (Marijuana and Hemp). It is one of the 113 identified substances found in Marijuana. It accounts for up to 40% of the plant's extract. As of 2018, preliminary clinical studies on CBD included research for the treatment of anxiety, movement disorders, and pain. CBD is available and consumed in multiple forms, including inhalation of smoke or vapor, as an aerosol spray, and by mouth. It may be supplied as an oil containing CBD, capsules, dried cannabis, or as a liquid solution. CBD is thought not to be as psychoactive as THC (delta-9-tetrahydrocannabinol - the chemical in marijuana responsible for the "HIGH"). Studies suggest that CBD may interact with different biological target receptors in the body, including cannabinoid and other neurotransmitter receptors. As of  2018 the mechanism of action for its biological effects has not been determined.  Side-effects  Adverse reactions: Dry mouth, diarrhea, decreased appetite, fatigue, drowsiness, malaise, weakness, sleep disturbances, and others.  Drug interactions:  CBD may interact with medications such as blood-thinners. CBD causes drowsiness on its own and it will increase drowsiness caused by other medications, including antihistamines (such as Benadryl), benzodiazepines (Xanax, Ativan, Valium), antipsychotics, antidepressants, opioids, alcohol and supplements such as kava, melatonin and St. John's Wort.    Other drug interactions: Brivaracetam (Briviact); Caffeine; Carbamazepine (Tegretol); Citalopram (Celexa); Clobazam (Onfi); Eslicarbazepine (Aptiom); Everolimus (Zostress); Lithium; Methadone (Dolophine); Rufinamide (Banzel); Sedative medications (CNS depressants); Sirolimus (Rapamune); Stiripentol (Diacomit); Tacrolimus (Prograf); Tamoxifen ; Soltamox); Topiramate (Topamax); Valproate; Warfarin (Coumadin); Zonisamide. (Last update: 02/01/2022) ____________________________________________________________________________________________   ____________________________________________________________________________________________  Naloxone Nasal Spray  Why am I receiving this medication? [place] STOP ACT requires that all patients taking high dose opioids or at risk of opioids respiratory depression, be prescribed an opioid reversal agent, such as Naloxone (AKA: Narcan).  What is this medication? NALOXONE (nal OX one) treats opioid overdose, which causes slow or shallow breathing, severe drowsiness, or trouble staying awake. Call emergency services after using this medication. You may need additional treatment. Naloxone works by reversing the effects of opioids. It belongs to a group of medications called opioid blockers.  COMMON BRAND NAME(S): Kloxxado, Narcan  What should I tell my care team before  I take this medication? They need to know if you have any of these conditions: Heart disease Substance use disorder An unusual or allergic reaction to naloxone, other medications, foods, dyes, or preservatives Pregnant or trying to get pregnant Breast-feeding  When to use this medication? This medication is to be used for the treatment of respiratory depression (less than 8 breaths per minute) secondary to opioid overdose.   How to use this medication? This medication is for use in the nose. Lay the person on their back. Support their neck with your hand and allow the head to tilt back before giving the medication. The nasal spray should be given into 1 nostril. After giving the medication, move the person onto their side. Do not remove or test the nasal spray until ready to use. Get emergency medical help right away after giving the first dose of this medication, even if the person wakes up. You should be familiar with how to recognize the signs and symptoms of a narcotic overdose. If more doses are needed, give the additional dose in the other nostril. Talk to your care team about the use of this medication in children. While this medication may be prescribed for children as young as newborns for selected conditions, precautions do apply.  Naloxone Overdosage: If you think you have taken too much of this medicine contact a poison control center or emergency room at once.  NOTE: This medicine is only for you. Do not share this medicine with others.  What if I miss a dose? This does not apply.  What may interact with this medication? This is only used during an emergency. No interactions are expected during emergency use. This list may not describe all possible interactions. Give your health care provider a list of all the medicines, herbs, non-prescription drugs, or dietary supplements you use. Also tell them if you smoke, drink alcohol, or use illegal drugs. Some items may interact with  your medicine.  What should I watch for while using this medication? Keep this medication ready for use in the case of an opioid overdose. Make sure that you have the phone number of your care team and local hospital ready. You may need to have additional doses of this medication. Each nasal spray contains a single dose. Some emergencies may require additional doses. After use, bring the treated person to the nearest hospital or call 911. Make sure the treating care team knows that the person has received a dose of this medication. You will receive additional instructions on what to do during and after use of this   medication before an emergency occurs.  What side effects may I notice from receiving this medication? Side effects that you should report to your care team as soon as possible: Allergic reactions--skin rash, itching, hives, swelling of the face, lips, tongue, or throat Side effects that usually do not require medical attention (report these to your care team if they continue or are bothersome): Constipation Dryness or irritation inside the nose Headache Increase in blood pressure Muscle spasms Stuffy nose Toothache This list may not describe all possible side effects. Call your doctor for medical advice about side effects. You may report side effects to FDA at 1-800-FDA-1088.  Where should I keep my medication? Because this is an emergency medication, you should keep it with you at all times.  Keep out of the reach of children and pets. Store between 20 and 25 degrees C (68 and 77 degrees F). Do not freeze. Throw away any unused medication after the expiration date. Keep in original box until ready to use.  NOTE: This sheet is a summary. It may not cover all possible information. If you have questions about this medicine, talk to your doctor, pharmacist, or health care provider.   2023 Elsevier/Gold Standard (2020-10-17  00:00:00)  ____________________________________________________________________________________________   

## 2022-06-01 NOTE — Progress Notes (Unsigned)
PROVIDER NOTE: Information contained herein reflects review and annotations entered in association with encounter. Interpretation of such information and data should be left to medically-trained personnel. Information provided to patient can be located elsewhere in the medical record under "Patient Instructions". Document created using STT-dictation technology, any transcriptional errors that may result from process are unintentional.    Patient: Debra Myers  Service Category: E/M  Provider: Oswaldo Done, MD  DOB: 08-28-1965  DOS: 06/02/2022  Referring Provider: Doreene Nest, NP  MRN: [id number]  Specialty: Interventional Pain Management  PCP: Debra Nest, NP  Type: Established Patient  Setting: Ambulatory outpatient    Location: Office  Delivery: Face-to-face     HPI  [name], a 57 y.o. year old female, is here today because of her Chronic pain syndrome [G89.4]. Debra Myers primary complain today is No chief complaint on file.  Pertinent problems: Debra Myers has Numbness of upper extremity; Radiculitis involving upper extremity; Chronic low back pain (1ry area of Pain) (Bilateral) (L>R); Lumbar spondylosis (Bulging Disc & Severe Left Foraminal Stenosis at L3-4); Chronic lower extremity pain (2ry area of Pain) (Left); Lumbar foraminal stenosis (Severe Left L3-4); Lumbar facet syndrome (Bilateral) (L>R); Cervical spondylosis (C5-6 & C6-7 DDD); Cervical (3 mm) Grade 1 Anterolisthesis of C4 over C5; Chronic neck pain (3ry area of Pain) (Bilateral) (L>R); Chronic cervical radicular pain (Bilateral) (L>R); Cervical foraminal stenosis (multilevel) (Bilateral); Muscle spasm, nocturnal; Musculoskeletal pain; Carpal tunnel syndrome (Bilateral) (L>R); Cervical facet syndrome (Bilateral) (L>R); Chronic pain syndrome; DDD (degenerative disc disease), lumbar; DDD (degenerative disc disease), cervical; Pain in right knee; Chronic pain of left knee; Osteoarthritis involving multiple joints;  Acute exacerbation of chronic low back pain; and Pain in left knee on their pertinent problem list. Pain Assessment: Severity of   is reported as a  /10. Location:    / . Onset:  . Quality:  . Timing:  . Modifying factor(s):  Marland Kitchen Vitals:  vitals were not taken for this visit.  BMI: Estimated body mass index is 41.45 kg/m as calculated from the following:   Height as of 04/26/22: 5\' 3"  (1.6 m).   Weight as of 04/26/22: 234 lb (106.1 kg). Last encounter: 03/03/2022. Last procedure: Visit date not found.  Reason for encounter: medication management. ***  RTCB: 09/04/2022   Pharmacotherapy Assessment  Analgesic: Hydrocodone/APAP 5/325, 2 tab PO QD (10 mg/day of hydrocodone) MME/day: 10 mg/day.   Monitoring: [place] PMP: PDMP reviewed during this encounter.       Pharmacotherapy: No side-effects or adverse reactions reported. Compliance: No problems identified. Effectiveness: Clinically acceptable.  No notes on file  No results found for: "CBDTHCR" No results found for: "D8THCCBX" No results found for: "D9THCCBX"  UDS:  Summary  Date Value Ref Range Status  08/05/2021 Note  Final    Comment:    ==================================================================== [employer] (MW) ==================================================================== Test                             Result       Flag       Units  Drug Present and Declared for Prescription Verification   Amphetamine                    948          EXPECTED   ng/mg creat    Amphetamine is available as a schedule II prescription drug.    Lorazepam  556          EXPECTED   ng/mg creat    Source of lorazepam is a scheduled prescription medication.    Hydrocodone                    667          EXPECTED   ng/mg creat   Dihydrocodeine                 102          EXPECTED   ng/mg creat   Norhydrocodone                 1229         EXPECTED   ng/mg creat    Sources of hydrocodone include scheduled  prescription medications.    Dihydrocodeine and norhydrocodone are expected metabolites of    hydrocodone. Dihydrocodeine is also available as a scheduled    prescription medication.  ==================================================================== Test                      Result    Flag   Units      Ref Range   Creatinine              95               mg/dL      >=44 ==================================================================== Declared Medications:  The flagging and interpretation on this report are based on the  following declared medications.  Unexpected results may arise from  inaccuracies in the declared medications.   **Note: The testing scope of this panel includes these medications:   Amphetamine (Vyvanse)  Hydrocodone (Norco)  Lorazepam (Ativan)   **Note: The testing scope of this panel does not include the  following reported medications:   Acetaminophen (Norco)  Albuterol  Aspirin  Atorvastatin  Bupropion (Wellbutrin XL)  Cetirizine (Zyrtec)  Fluticasone (Flonase)  Ibuprofen (Advil)  Lamotrigine (Lamictal)  Metaxalone (Skelaxin)  Metformin  Montelukast  Paroxetine (Paxil)  Spironolactone ==================================================================== For clinical consultation, please call [phone]. ====================================================================       ROS  Constitutional: Denies any fever or chills Gastrointestinal: No reported hemesis, hematochezia, vomiting, or acute GI distress Musculoskeletal: Denies any acute onset joint swelling, redness, loss of ROM, or weakness Neurological: No reported episodes of acute onset apraxia, aphasia, dysarthria, agnosia, amnesia, paralysis, loss of coordination, or loss of consciousness  Medication Review  Calcium-Magnesium-Vitamin D, HYDROcodone-acetaminophen, LORazepam, PARoxetine, albuterol, amphetamine-dextroamphetamine, aspirin EC, atorvastatin, buPROPion, cetirizine,  famotidine, fluticasone, hydrocortisone cream, ibuprofen, lamoTRIgine, metFORMIN, metaxalone, montelukast, naloxone, and spironolactone  History Review  Allergy: Debra Myers is allergic to oxycodone, penicillins, red dye, and sulfa antibiotics. Drug: Ms. Bhavsar  reports no history of drug use. Alcohol:  reports no history of alcohol use. Tobacco:  reports that she has never smoked. She has never used smokeless tobacco. Social: Ms. Gilfillan  reports that she has never smoked. She has never used smokeless tobacco. She reports that she does not drink alcohol and does not use drugs. Medical:  has a past medical history of Bulging lumbar disc (L3-4) (02/26/2015), Chronic pain syndrome, DDD (degenerative disc disease), cervical, DDD (degenerative disc disease), lumbar, GAD (generalized anxiety disorder), GERD (gastroesophageal reflux disease), History of transient ischemic attack (TIA) (06/11/2015), Hypertension, MDD (major depressive disorder), Mild intermittent asthma, PCOS (polycystic ovarian syndrome), Pre-diabetes, Retained intrauterine contraceptive device (IUD), Seasonal allergic rhinitis, and Wears contact lenses. Surgical: Ms. Garrelts  has a past surgical  history that includes Cholecystectomy, laparoscopic (11/22/1999); Wisdom tooth extraction; Dilatation & curettage/hysteroscopy with myosure (N/A, 09/10/2014); Laparoscopic gastric banding (04/2012); Hysteroscopy (N/A, 02/11/2022); and IUD removal (N/A, 02/11/2022). Family: family history includes Asthma in her maternal grandmother; Cancer in her mother; Diabetes in her father; Hypertension in her father and mother; Stroke in her maternal grandfather and maternal grandmother.  Laboratory Chemistry Profile   Renal Lab Results  Component Value Date   BUN 19 02/11/2022   CREATININE 0.90 02/11/2022   BCR NOT APPLICABLE 11/23/2019   GFR 70.13 12/29/2021   GFRAA >60 12/16/2017   GFRNONAA >60 02/11/2022    Hepatic Lab Results  Component Value Date    AST 18 12/29/2021   ALT 18 12/29/2021   ALBUMIN 4.4 12/29/2021   ALKPHOS 110 12/29/2021   HCVAB NEGATIVE 05/18/2013    Electrolytes Lab Results  Component Value Date   NA 137 02/11/2022   K 4.4 02/11/2022   CL 105 02/11/2022   CALCIUM 9.2 02/11/2022   PHOS 4.0 10/31/2015    Bone Lab Results  Component Value Date   VD25OH 27.38 (L) 12/29/2021    Inflammation (CRP: Acute Phase) (ESR: Chronic Phase) Lab Results  Component Value Date   ESRSEDRATE 32 05/26/2017         Note: Above Lab results reviewed.  Recent Imaging Review  US Transvaginal Non-OB Indication:  retained IUD, possible recent expulsion of IUD.   Findings: Pelvic US  Uterus 5.82 x 4.49 x 2.59 cm.  IUD in endometrial canal.  Left ovary 2.36 x 1.4 cm.  Right ovary 2.51 x 1.58 cm.  No adnexal masses.  No free fluid.   Impression:  IUD in normal position in the endometrial canal. Note: Reviewed        Physical Exam  General appearance: Well nourished, well developed, and well hydrated. In no apparent acute distress Mental status: Alert, oriented x 3 (person, place, & time)       Respiratory: No evidence of acute respiratory distress Eyes: PERLA Vitals: There were no vitals taken for this visit. BMI: Estimated body mass index is 41.45 kg/m as calculated from the following:   Height as of 04/26/22: 5\' 3"  (1.6 m).   Weight as of 04/26/22: 234 lb (106.1 kg). Ideal: Patient weight not recorded  Assessment   Diagnosis Status  1. Chronic pain syndrome   2. Chronic low back pain (1ry area of Pain) (Bilateral) (L>R)   3. Chronic lower extremity pain (2ry area of Pain) (Left)   4. Chronic neck pain (3ry area of Pain) (Bilateral) (L>R)   5. Lumbar facet syndrome (Bilateral) (L>R)   6. Cervical facet syndrome (Bilateral) (L>R)   7. Chronic cervical radicular pain (Bilateral) (L>R)   8. Pharmacologic therapy   9. Musculoskeletal pain   10. Chronic use of opiate for therapeutic purpose   11. Encounter for  medication management   12. Encounter for chronic pain management    Controlled Controlled Controlled   Updated Problems: No problems updated.  Plan of Care  Problem-specific:  No problem-specific Assessment & Plan notes found for this encounter.  Ms. JENIENE GRONDAHL has a current medication list which includes the following long-term medication(s): albuterol, amphetamine-dextroamphetamine, atorvastatin, calcium-magnesium-vitamin d, famotidine, fluticasone, hydrocodone-acetaminophen, hydrocodone-acetaminophen, metformin, montelukast, and spironolactone.  Pharmacotherapy (Medications Ordered): No orders of the defined types were placed in this encounter.  Orders:  No orders of the defined types were placed in this encounter.  Follow-up plan:   No follow-ups on file.  Interventional Therapies  Risk Factors  Considerations:     Planned  Pending:      Under consideration:   Diagnostic bilateral lumbar facet block  Possible bilateral lumbar facet RFA.  Diagnostic left L3-4 LESI  Diagnostic left L3 TFESI  Diagnostic left L4 TFESI  Diagnostic bilateral L5 TFESI  Diagnostic left CESI  Diagnostic bilateral cervical facet block  Possible bilateral cervical facet RFA.    Completed:   None at this time   Completed by other providers:   None at this time   Therapeutic  Palliative (PRN) options:   None established   Pharmacotherapy  Nonopioids transferred to 12/24/2019: Skelaxin        Recent Visits Date Type Provider Dept  03/03/22 Office Visit Delano MetzNaveira, Lavora Brisbon, MD [facility]  Showing recent visits within past 90 days and meeting all other requirements Future Appointments Date Type Provider Dept  06/02/22 Appointment Delano MetzNaveira, Treavor Blomquist, MD [facility]  Showing future appointments within next 90 days and meeting all other requirements  I discussed the assessment and treatment plan with the patient. The patient was provided an  opportunity to ask questions and all were answered. The patient agreed with the plan and demonstrated an understanding of the instructions.  Patient advised to call back or seek an in-person evaluation if the symptoms or condition worsens.  Duration of encounter: *** minutes.  Total time on encounter, as per AMA guidelines included both the face-to-face and non-face-to-face time personally spent by the physician and/or other qualified health care professional(s) on the day of the encounter (includes time in activities that require the physician or other qualified health care professional and does not include time in activities normally performed by clinical staff). Physician's time may include the following activities when performed: Preparing to see the patient (e.g., pre-charting review of records, searching for previously ordered imaging, lab work, and nerve conduction tests) Review of prior analgesic pharmacotherapies. Reviewing PMP Interpreting ordered tests (e.g., lab work, imaging, nerve conduction tests) Performing post-procedure evaluations, including interpretation of diagnostic procedures Obtaining and/or reviewing separately obtained history Performing a medically appropriate examination and/or evaluation Counseling and educating the patient/family/caregiver Ordering medications, tests, or procedures Referring and communicating with other health care professionals (when not separately reported) Documenting clinical information in the electronic or other health record Independently interpreting results (not separately reported) and communicating results to the patient/ family/caregiver Care coordination (not separately reported)  Note by: Debra DoneFrancisco A Chiyeko Ferre, MD Date: 06/02/2022; Time: 5:00 PM

## 2022-06-02 ENCOUNTER — Ambulatory Visit (HOSPITAL_BASED_OUTPATIENT_CLINIC_OR_DEPARTMENT_OTHER): Payer: Managed Care, Other (non HMO) | Admitting: Pain Medicine

## 2022-06-02 DIAGNOSIS — M47816 Spondylosis without myelopathy or radiculopathy, lumbar region: Secondary | ICD-10-CM

## 2022-06-02 DIAGNOSIS — M47812 Spondylosis without myelopathy or radiculopathy, cervical region: Secondary | ICD-10-CM

## 2022-06-02 DIAGNOSIS — G8929 Other chronic pain: Secondary | ICD-10-CM

## 2022-06-02 DIAGNOSIS — Z79899 Other long term (current) drug therapy: Secondary | ICD-10-CM

## 2022-06-02 DIAGNOSIS — Z79891 Long term (current) use of opiate analgesic: Secondary | ICD-10-CM

## 2022-06-02 DIAGNOSIS — M7918 Myalgia, other site: Secondary | ICD-10-CM

## 2022-06-02 DIAGNOSIS — Z91199 Patient's noncompliance with other medical treatment and regimen due to unspecified reason: Secondary | ICD-10-CM | POA: Insufficient documentation

## 2022-06-02 DIAGNOSIS — G894 Chronic pain syndrome: Secondary | ICD-10-CM

## 2022-06-09 ENCOUNTER — Ambulatory Visit (HOSPITAL_BASED_OUTPATIENT_CLINIC_OR_DEPARTMENT_OTHER): Payer: Managed Care, Other (non HMO) | Admitting: Pain Medicine

## 2022-06-09 DIAGNOSIS — Z91199 Patient's noncompliance with other medical treatment and regimen due to unspecified reason: Secondary | ICD-10-CM

## 2022-06-09 NOTE — Patient Instructions (Signed)
____________________________________________________________________________________________  Opioid Pain Medication Update  To: All patients taking opioid pain medications. (I.e.: hydrocodone, hydromorphone, oxycodone, oxymorphone, morphine, codeine, methadone, tapentadol, tramadol, buprenorphine, fentanyl, etc.)  Re: Updated review of side effects and adverse reactions of opioid analgesics, as well as new information about long term effects of this class of medications.  Direct risks of long-term opioid therapy are not limited to opioid addiction and overdose. Potential medical risks include serious fractures, breathing problems during sleep, hyperalgesia, immunosuppression, chronic constipation, bowel obstruction, myocardial infarction, and tooth decay secondary to xerostomia.  Unpredictable adverse effects that can occur even if you take your medication correctly: Cognitive impairment, respiratory depression, and death. Most people think that if they take their medication "correctly", and "as instructed", that they will be safe. Nothing could be farther from the truth. In reality, a significant amount of recorded deaths associated with the use of opioids has occurred in individuals that had taken the medication for a long time, and were taking their medication correctly. The following are examples of how this can happen: Patient taking his/her medication for a long time, as instructed, without any side effects, is given a certain antibiotic or another unrelated medication, which in turn triggers a "Drug-to-drug interaction" leading to disorientation, cognitive impairment, impaired reflexes, respiratory depression or an untoward event leading to serious bodily harm or injury, including death.  Patient taking his/her medication for a long time, as instructed, without any side effects, develops an acute impairment of liver and/or kidney function. This will lead to a rapid inability of the body to  breakdown and eliminate their pain medication, which will result in effects similar to an "overdose", but with the same medicine and dose that they had always taken. This again may lead to disorientation, cognitive impairment, impaired reflexes, respiratory depression or an untoward event leading to serious bodily harm or injury, including death.  A similar problem will occur with patients as they grow older and their liver and kidney function begins to decrease as part of the aging process.  Background information: Historically, the original case for using long-term opioid therapy to treat chronic noncancer pain was based on safety assumptions that subsequent experience has called into question. In 1996, the American Pain Society and the American Academy of Pain Medicine issued a consensus statement supporting long-term opioid therapy. This statement acknowledged the dangers of opioid prescribing but concluded that the risk for addiction was low; respiratory depression induced by opioids was short-lived, occurred mainly in opioid-naive patients, and was antagonized by pain; tolerance was not a common problem; and efforts to control diversion should not constrain opioid prescribing. This has now proven to be wrong. Experience regarding the risks for opioid addiction, misuse, and overdose in community practice has failed to support these assumptions.  According to the Centers for Disease Control and Prevention, fatal overdoses involving opioid analgesics have increased sharply over the past decade. Currently, more than 96,700 people die from drug overdoses every year. Opioids are a factor in 7 out of every 10 overdose deaths. Deaths from drug overdose have surpassed motor vehicle accidents as the leading cause of death for individuals between the ages of 35 and 54.  Clinical data suggest that neuroendocrine dysfunction may be very common in both men and women, potentially causing hypogonadism, erectile  dysfunction, infertility, decreased libido, osteoporosis, and depression. Recent studies linked higher opioid dose to increased opioid-related mortality. Controlled observational studies reported that long-term opioid therapy may be associated with increased risk for cardiovascular events. Subsequent meta-analysis concluded   that the safety of long-term opioid therapy in elderly patients has not been proven.   Side Effects and adverse reactions: Common side effects: Drowsiness (sedation). Dizziness. Nausea and vomiting. Constipation. Physical dependence -- Dependence often manifests with withdrawal symptoms when opioids are discontinued or decreased. Tolerance -- As you take repeated doses of opioids, you require increased medication to experience the same effect of pain relief. Respiratory depression -- This can occur in healthy people, especially with higher doses. However, people with COPD, asthma or other lung conditions may be even more susceptible to fatal respiratory impairment.  Uncommon side effects: An increased sensitivity to feeling pain and extreme response to pain (hyperalgesia). Chronic use of opioids can lead to this. Delayed gastric emptying (the process by which the contents of your stomach are moved into your small intestine). Muscle rigidity. Immune system and hormonal dysfunction. Quick, involuntary muscle jerks (myoclonus). Arrhythmia. Itchy skin (pruritus). Dry mouth (xerostomia).  Long-term side effects: Chronic constipation. Sleep-disordered breathing (SDB). Increased risk of bone fractures. Hypothalamic-pituitary-adrenal dysregulation. Increased risk of overdose.  RISKS: Fractures and Falls:  Opioids increase the risk and incidence of falls. This is of particular importance in elderly patients.  Endocrine System:  Long-term administration is associated with endocrine abnormalities (endocrinopathies). (Also known as Opioid-induced Endocrinopathy) Influences  on both the hypothalamic-pituitary-adrenal axis?and the hypothalamic-pituitary-gonadal axis have been demonstrated with consequent hypogonadism and adrenal insufficiency in both sexes. Hypogonadism and decreased levels of dehydroepiandrosterone sulfate have been reported in men and women. Endocrine effects include: Amenorrhoea in women (abnormal absence of menstruation) Reduced libido in both sexes Decreased sexual function Erectile dysfunction in men Hypogonadisms (decreased testicular function with shrinkage of testicles) Infertility Depression and fatigue Loss of muscle mass Anxiety Depression Immune suppression Hyperalgesia Weight gain Anemia Osteoporosis Patients (particularly women of childbearing age) should avoid opioids. There is insufficient evidence to recommend routine monitoring of asymptomatic patients taking opioids in the long-term for hormonal deficiencies.  Immune System: Human studies have demonstrated that opioids have an immunomodulating effect. These effects are mediated via opioid receptors both on immune effector cells and in the central nervous system. Opioids have been demonstrated to have adverse effects on antimicrobial response and anti-tumour surveillance. Buprenorphine has been demonstrated to have no impact on immune function.  Opioid Induced Hyperalgesia: Human studies have demonstrated that prolonged use of opioids can lead to a state of abnormal pain sensitivity, sometimes called opioid induced hyperalgesia (OIH). Opioid induced hyperalgesia is not usually seen in the absence of tolerance to opioid analgesia. Clinically, hyperalgesia may be diagnosed if the patient on long-term opioid therapy presents with increased pain. This might be qualitatively and anatomically distinct from pain related to disease progression or to breakthrough pain resulting from development of opioid tolerance. Pain associated with hyperalgesia tends to be more diffuse than the  pre-existing pain and less defined in quality. Management of opioid induced hyperalgesia requires opioid dose reduction.  Cancer: Chronic opioid therapy has been associated with an increased risk of cancer among noncancer patients with chronic pain. This association was more evident in chronic strong opioid users. Chronic opioid consumption causes significant pathological changes in the small intestine and colon. Epidemiological studies have found that there is a link between opium dependence and initiation of gastrointestinal cancers. Cancer is the second leading cause of death after cardiovascular disease. Chronic use of opioids can cause multiple conditions such as GERD, immunosuppression and renal damage as well as carcinogenic effects, which are associated with the incidence of cancers.   Mortality: Long-term opioid use   has been associated with increased mortality among patients with chronic non-cancer pain (CNCP).  Prescription of long-acting opioids for chronic noncancer pain was associated with a significantly increased risk of all-cause mortality, including deaths from causes other than overdose.  Reference: Von Korff M, Kolodny A, Deyo RA, Chou R. Long-term opioid therapy reconsidered. Ann Intern Med. 2011 Sep 6;155(5):325-8. doi: 10.7326/[id number]. PMID: 21893626; PMCID: PMC3280085. Bedson J, Chen Y, Ashworth J, Hayward RA, Dunn KM, Jordan KP. Risk of adverse events in patients prescribed long-term opioids: A cohort study in the [place] Clinical Practice Research Datalink. Eur J Pain. 2019 May;23(5):908-922. doi: 10.1002/ejp.1357. Epub 2019 Jan 31. PMID: 30620116. Colameco S, Coren JS, Ciervo CA. Continuous opioid treatment for chronic noncancer pain: a time for moderation in prescribing. Postgrad Med. 2009 Jul;121(4):61-6. doi: 10.3810/pgm.2009.07.2032. PMID: 19641271. Chou R, Turner JA, Devine EB, Hansen RN, Sullivan SD, Blazina I, Dana T, Bougatsos C, Deyo RA. The  effectiveness and risks of long-term opioid therapy for chronic pain: a systematic review for a National Institutes of Health Pathways to Prevention Workshop. Ann Intern Med. 2015 Feb 17;162(4):276-86. doi: 10.7326/M14-2559. PMID: 25581257. Warner M, Chen LH, Makuc DM. NCHS Data Brief No. 22. [place]: Centers for Disease Control and Prevention; 2009. Sep, Increase in Fatal Poisonings Involving Opioid Analgesics in the [place], 1999-2006. Song IA, Choi HR, Oh TK. Long-term opioid use and mortality in patients with chronic non-cancer pain: Ten-year follow-up study in [name] from 2010 through 2019. EClinicalMedicine. 2022 Jul 18;51:101558. doi: 10.1016/j.eclinm.2022.101558. PMID: 35875817; PMCID: PMC9304910. Huser, W., Schubert, T., Vogelmann, T. et al. All-cause mortality in patients with long-term opioid therapy compared with non-opioid analgesics for chronic non-cancer pain: a database study. BMC Med 18, 162 (2020). https://doi.org/10.1186/[email or web address] Rashidian H, Zendehdel K, Kamangar F, Malekzadeh R, Haghdoost AA. An Ecological Study of the Association between Opiate Use and Incidence of Cancers. Addict Health. 2016 Fall;8(4):252-260. PMID: 28819556; PMCID: PMC5554805.  Our Goal: Our goal is to control your pain with means other than the use of opioid pain medications.  Our Recommendation: Talk to your physician about coming off of these medications. We can assist you with the tapering down and stopping these medicines. Based on the new information, even if you cannot completely stop the medication, a decrease in the dose may be associated with a lesser risk. Ask for other means of controlling the pain. Decrease or eliminate those factors that significantly contribute to your pain such as smoking, obesity, and a diet heavily tilted towards "inflammatory" nutrients.  Last Updated: 04/21/2022    ____________________________________________________________________________________________     ____________________________________________________________________________________________  Patient Information update  To: All of our patients.  Re: Name change.  It has been made official that our current name, "[facility]"   will soon be changed to "[facility]".   The purpose of this change is to eliminate any confusion created by the concept of our practice being a "Medication Management Pain Clinic". In the past this has led to the misconception that we treat pain primarily by the use of prescription medications.  Nothing can be farther from the truth.   Understanding PAIN MANAGEMENT: To further understand what our practice does, you first have to understand that "Pain Management" is a subspecialty that requires additional training once a physician has completed their specialty training, which can be in either Anesthesia, Neurology, Psychiatry, or Physical Medicine and Rehabilitation (PMR). Each one of these contributes to the final approach taken by each physician to   the management of their patient's pain. To be a "Pain Management Specialist" you must have first completed one of the specialty trainings below.  Anesthesiologists - trained in clinical pharmacology and interventional techniques such as nerve blockade and regional as well as central neuroanatomy. They are trained to block pain before, during, and after surgical interventions.  Neurologists - trained in the diagnosis and pharmacological treatment of complex neurological conditions, such as Multiple Sclerosis, Parkinson's, spinal cord injuries, and other systemic conditions that may be associated with symptoms that may include but are not limited to pain. They tend to rely primarily on the treatment of chronic pain  using prescription medications.  Psychiatrist - trained in conditions affecting the psychosocial wellbeing of patients including but not limited to depression, anxiety, schizophrenia, personality disorders, addiction, and other substance use disorders that may be associated with chronic pain. They tend to rely primarily on the treatment of chronic pain using prescription medications.   Physical Medicine and Rehabilitation (PMR) physicians, also known as physiatrists - trained to treat a wide variety of medical conditions affecting the brain, spinal cord, nerves, bones, joints, ligaments, muscles, and tendons. Their training is primarily aimed at treating patients that have suffered injuries that have caused severe physical impairment. Their training is primarily aimed at the physical therapy and rehabilitation of those patients. They may also work alongside orthopedic surgeons or neurosurgeons using their expertise in assisting surgical patients to recover after their surgeries.  INTERVENTIONAL PAIN MANAGEMENT is sub-subspecialty of Pain Management.  Our physicians are Board-certified in Anesthesia, Pain Management, and Interventional Pain Management.  This meaning that not only have they been trained and Board-certified in their specialty of Anesthesia, and subspecialty of Pain Management, but they have also received further training in the sub-subspecialty of Interventional Pain Management, in order to become Board-certified as INTERVENTIONAL PAIN MANAGEMENT SPECIALIST.    Mission: Our goal is to use our skills in  INTERVENTIONAL PAIN MANAGEMENT as alternatives to the chronic use of prescription opioid medications for the treatment of pain. To make this more clear, we have changed our name to reflect what we do and offer. We will continue to offer medication management assessment and recommendations, but we will not be taking over any patient's medication  management.  ____________________________________________________________________________________________     ____________________________________________________________________________________________  National Pain Medication Shortage  The [place] is experiencing worsening drug shortages. These have had a negative widespread effect on patient care and treatment. Not expected to improve any time soon. Predicted to last past 2029.   Drug shortage list (generic names) Oxycodone IR Oxycodone/APAP Oxymorphone IR Hydromorphone Hydrocodone/APAP Morphine  Where is the problem?  Manufacturing and supply level.  Will this shortage affect you?  Only if you take any of the above pain medications.  How? You may be unable to fill your prescription.  Your pharmacist may offer a "partial fill" of your prescription. (Warning: Do not accept partial fills.) Prescriptions partially filled cannot be transferred to another pharmacy. Read our Medication Rules and Regulation. Depending on how much medicine you are dependent on, you may experience withdrawals when unable to get the medication.  Recommendations: Consider ending your dependence on opioid pain medications. Ask your pain specialist to assist you with the process. Consider switching to a medication currently not in shortage, such as Buprenorphine. Talk to your pain specialist about this option. Consider decreasing your pain medication requirements by managing tolerance thru "Drug Holidays". This may help minimize withdrawals, should you run out of medicine. Control your pain thru   the use of non-pharmacological interventional therapies.   Your prescriber: Prescribers cannot be blamed for shortages. Medication manufacturing and supply issues cannot be fixed by the prescriber.   NOTE: The prescriber is not responsible for supplying the medication, or solving supply issues. Work with your pharmacist to solve it. The patient is responsible for  the decision to take or continue taking the medication and for identifying and securing a legal supply source. By law, supplying the medication is the job and responsibility of the pharmacy. The prescriber is responsible for the evaluation, monitoring, and prescribing of these medications.   Prescribers will NOT: Re-issue prescriptions that have been partially filled. Re-issue prescriptions already sent to a pharmacy.  Re-send prescriptions to a different pharmacy because yours did not have your medication. Ask pharmacist to order more medicine or transfer the prescription to another pharmacy. (Read below.)  New 2023 regulation: "October 23, 2021 Revised Regulation Allows DEA-Registered Pharmacies to Transfer Electronic Prescriptions at a Patient's Request DEA Headquarters Division - Public Information Office Patients now have the ability to request their electronic prescription be transferred to another pharmacy without having to go back to their practitioner to initiate the request. This revised regulation went into effect on Monday, October 19, 2021.     At a patient's request, a DEA-registered retail pharmacy can now transfer an electronic prescription for a controlled substance (schedules II-V) to another DEA-registered retail pharmacy. Prior to this change, patients would have to go through their practitioner to cancel their prescription and have it re-issued to a different pharmacy. The process was taxing and time consuming for both patients and practitioners.    The Drug Enforcement Administration (DEA) published its intent to revise the process for transferring electronic prescriptions on January 11, 2020.  The final rule was published in the federal register on September 17, 2021 and went into effect 30 days later.  Under the final rule, a prescription can only be transferred once between pharmacies, and only if allowed under existing state or other applicable law. The prescription must  remain in its electronic form; may not be altered in any way; and the transfer must be communicated directly between two licensed pharmacists. It's important to note, any authorized refills transfer with the original prescription, which means the entire prescription will be filled at the same pharmacy".  Reference: https://www.dea.gov/stories/2023/2023-10/2021-09-01/revised-regulation-allows-dea-registered-pharmacies-transfer (DEA website announcement)  https://www.govinfo.gov/content/pkg/FR-2021-09-17/pdf/2023-15847.pdf (Federal Register  Department of Justice)   Federal Register / Vol. 88, No. 143 / Thursday, September 17, 2021 / Rules and Regulations DEPARTMENT OF JUSTICE  Drug Enforcement Administration  21 CFR Part 1306  [Docket No. [id number]]  RIN [id number]-AB64 Transfer of Electronic Prescriptions for Schedules II-V Controlled Substances Between Pharmacies for Initial Filling  ____________________________________________________________________________________________     ____________________________________________________________________________________________  Transfer of Pain Medication between Pharmacies  Re: 2023 DEA Clarification on existing regulation  Published on DEA Website: October 23, 2021  Title: Revised Regulation Allows DEA-Registered Pharmacies to Transfer Electronic Prescriptions at a Patient's Request DEA Headquarters Division - Public Information Office  "Patients now have the ability to request their electronic prescription be transferred to another pharmacy without having to go back to their practitioner to initiate the request. This revised regulation went into effect on Monday, October 19, 2021.     At a patient's request, a DEA-registered retail pharmacy can now transfer an electronic prescription for a controlled substance (schedules II-V) to another DEA-registered retail pharmacy. Prior to this change, patients would have to go through their practitioner to  cancel their prescription   and have it re-issued to a different pharmacy. The process was taxing and time consuming for both patients and practitioners.    The Drug Enforcement Administration (DEA) published its intent to revise the process for transferring electronic prescriptions on January 11, 2020.  The final rule was published in the federal register on September 17, 2021 and went into effect 30 days later.  Under the final rule, a prescription can only be transferred once between pharmacies, and only if allowed under existing state or other applicable law. The prescription must remain in its electronic form; may not be altered in any way; and the transfer must be communicated directly between two licensed pharmacists. It's important to note, any authorized refills transfer with the original prescription, which means the entire prescription will be filled at the same pharmacy."    REFERENCES: 1. DEA website announcement https://www.dea.gov/stories/2023/2023-10/2021-09-01/revised-regulation-allows-dea-registered-pharmacies-transfer  2. Department of Justice website  https://www.govinfo.gov/content/pkg/FR-2021-09-17/pdf/2023-15847.pdf  3. DEPARTMENT OF JUSTICE Drug Enforcement Administration 21 CFR Part 1306 [Docket No. [id number]] RIN [id number] "Transfer of Electronic Prescriptions for Schedules II-V Controlled Substances Between Pharmacies for Initial Filling"  ____________________________________________________________________________________________     _______________________________________________________________________  Medication Rules  Purpose: To inform patients, and their family members, of our medication rules and regulations.  Applies to: All patients receiving prescriptions from our practice (written or electronic).  Pharmacy of record: This is the pharmacy where your electronic prescriptions will be sent. Make sure we have the correct one.  Electronic prescriptions: In  compliance with the [place] Strengthen Opioid Misuse Prevention (STOP) Act of 2017 (Session Law 2017-74/H243), effective February 22, 2018, all controlled substances must be electronically prescribed. Written prescriptions, faxing, or calling prescriptions to a pharmacy will no longer be done.  Prescription refills: These will be provided only during in-person appointments. No medications will be renewed without a "face-to-face" evaluation with your provider. Applies to all prescriptions.  NOTE: The following applies primarily to controlled substances (Opioid* Pain Medications).   Type of encounter (visit): For patients receiving controlled substances, face-to-face visits are required. (Not an option and not up to the patient.)  Patient's responsibilities: Pain Pills: Bring all pain pills to every appointment (except for procedure appointments). Pill Bottles: Bring pills in original pharmacy bottle. Bring bottle, even if empty. Always bring the bottle of the most recent fill.  Medication refills: You are responsible for knowing and keeping track of what medications you are taking and when is it that you will need a refill. The day before your appointment: write a list of all prescriptions that need to be refilled. The day of the appointment: give the list to the admitting nurse. Prescriptions will be written only during appointments. No prescriptions will be written on procedure days. If you forget a medication: it will not be "Called in", "Faxed", or "electronically sent". You will need to get another appointment to get these prescribed. No early refills. Do not call asking to have your prescription filled early. Partial  or short prescriptions: Occasionally your pharmacy may not have enough pills to fill your prescription.  NEVER ACCEPT a partial fill or a prescription that is short of the total amount of pills that you were prescribed.  With controlled substances the law allows 72 hours for  the pharmacy to complete the prescription.  If the prescription is not completed within 72 hours, the pharmacist will require a new prescription to be written. This means that you will be short on your medicine and we WILL NOT send another prescription to complete your original   prescription.  Instead, request the pharmacy to send a carrier to a nearby branch to get enough medication to provide you with your full prescription. Prescription Accuracy: You are responsible for carefully inspecting your prescriptions before leaving our office. Have the discharge nurse carefully go over each prescription with you, before taking them home. Make sure that your name is accurately spelled, that your address is correct. Check the name and dose of your medication to make sure it is accurate. Check the number of pills, and the written instructions to make sure they are clear and accurate. Make sure that you are given enough medication to last until your next medication refill appointment. Taking Medication: Take medication as prescribed. When it comes to controlled substances, taking less pills or less frequently than prescribed is permitted and encouraged. Never take more pills than instructed. Never take the medication more frequently than prescribed.  Inform other Doctors: Always inform, all of your healthcare providers, of all the medications you take. Pain Medication from other Providers: You are not allowed to accept any additional pain medication from any other Doctor or Healthcare provider. There are two exceptions to this rule. (see below) In the event that you require additional pain medication, you are responsible for notifying us, as stated below. Cough Medicine: Often these contain an opioid, such as codeine or hydrocodone. Never accept or take cough medicine containing these opioids if you are already taking an opioid* medication. The combination may cause respiratory failure and death. Medication Agreement:  You are responsible for carefully reading and following our Medication Agreement. This must be signed before receiving any prescriptions from our practice. Safely store a copy of your signed Agreement. Violations to the Agreement will result in no further prescriptions. (Additional copies of our Medication Agreement are available upon request.) Laws, Rules, & Regulations: All patients are expected to follow all Federal and State Laws, Statutes, Rules, & Regulations. Ignorance of the Laws does not constitute a valid excuse.  Illegal drugs and Controlled Substances: The use of illegal substances (including, but not limited to marijuana and its derivatives) and/or the illegal use of any controlled substances is strictly prohibited. Violation of this rule may result in the immediate and permanent discontinuation of any and all prescriptions being written by our practice. The use of any illegal substances is prohibited. Adopted CDC guidelines & recommendations: Target dosing levels will be at or below 60 MME/day. Use of benzodiazepines** is not recommended.  Exceptions: There are only two exceptions to the rule of not receiving pain medications from other Healthcare Providers. Exception #1 (Emergencies): In the event of an emergency (i.e.: accident requiring emergency care), you are allowed to receive additional pain medication. However, you are responsible for: As soon as you are able, call our office [phone], at any time of the day or night, and leave a message stating your name, the date and nature of the emergency, and the name and dose of the medication prescribed. In the event that your call is answered by a member of our staff, make sure to document and save the date, time, and the name of the person that took your information.  Exception #2 (Planned Surgery): In the event that you are scheduled by another doctor or dentist to have any type of surgery or procedure, you are allowed (for a period no  longer than 30 days), to receive additional pain medication, for the acute post-op pain. However, in this case, you are responsible for picking up a copy of   our "Post-op Pain Management for Surgeons" handout, and giving it to your surgeon or dentist. This document is available at our office, and does not require an appointment to obtain it. Simply go to our office during business hours (Monday-Thursday from 8:00 AM to 4:00 PM) (Friday 8:00 AM to 12:00 Noon) or if you have a scheduled appointment with us, prior to your surgery, and ask for it by name. In addition, you are responsible for: calling our office [phone], at any time of the day or night, and leaving a message stating your name, name of your surgeon, type of surgery, and date of procedure or surgery. Failure to comply with your responsibilities may result in termination of therapy involving the controlled substances. Medication Agreement Violation. Following the above rules, including your responsibilities will help you in avoiding a Medication Agreement Violation ("Breaking your Pain Medication Contract").  Consequences:  Not following the above rules may result in permanent discontinuation of medication prescription therapy.  *Opioid medications include: morphine, codeine, oxycodone, oxymorphone, hydrocodone, hydromorphone, meperidine, tramadol, tapentadol, buprenorphine, fentanyl, methadone. **Benzodiazepine medications include: diazepam (Valium), alprazolam (Xanax), clonazepam (Klonopine), lorazepam (Ativan), clorazepate (Tranxene), chlordiazepoxide (Librium), estazolam (Prosom), oxazepam (Serax), temazepam (Restoril), triazolam (Halcion) (Last updated: 12/15/2021) ______________________________________________________________________    ______________________________________________________________________  Medication Recommendations and Reminders  Applies to: All patients receiving prescriptions (written and/or  electronic).  Medication Rules & Regulations: You are responsible for reading, knowing, and following our "Medication Rules" document. These exist for your safety and that of others. They are not flexible and neither are we. Dismissing or ignoring them is an act of "non-compliance" that may result in complete and irreversible termination of such medication therapy. For safety reasons, "non-compliance" will not be tolerated. As with the [place] fundamental legal principle of "ignorance of the law is no defense", we will accept no excuses for not having read and knowing the content of documents provided to you by our practice.  Pharmacy of record:  Definition: This is the pharmacy where your electronic prescriptions will be sent.  We do not endorse any particular pharmacy. It is up to you and your insurance to decide what pharmacy to use.  We do not restrict you in your choice of pharmacy. However, once we write for your prescriptions, we will NOT be re-sending more prescriptions to fix restricted supply problems created by your pharmacy, or your insurance.  The pharmacy listed in the electronic medical record should be the one where you want electronic prescriptions to be sent. If you choose to change pharmacy, simply notify our nursing staff. Changes will be made only during your regular appointments and not over the phone.  Recommendations: Keep all of your pain medications in a safe place, under lock and key, even if you live alone. We will NOT replace lost, stolen, or damaged medication. We do not accept "Police Reports" as proof of medications having been stolen. After you fill your prescription, take 1 week's worth of pills and put them away in a safe place. You should keep a separate, properly labeled bottle for this purpose. The remainder should be kept in the original bottle. Use this as your primary supply, until it runs out. Once it's gone, then you know that you have 1 week's worth of medicine,  and it is time to come in for a prescription refill. If you do this correctly, it is unlikely that you will ever run out of medicine. To make sure that the above recommendation works, it is very important that you make   sure your medication refill appointments are scheduled at least 1 week before you run out of medicine. To do this in an effective manner, make sure that you do not leave the office without scheduling your next medication management appointment. Always ask the nursing staff to show you in your prescription , when your medication will be running out. Then arrange for the receptionist to get you a return appointment, at least 7 days before you run out of medicine. Do not wait until you have 1 or 2 pills left, to come in. This is very poor planning and does not take into consideration that we may need to cancel appointments due to bad weather, sickness, or emergencies affecting our staff. DO NOT ACCEPT A "Partial Fill": If for any reason your pharmacy does not have enough pills/tablets to completely fill or refill your prescription, do not allow for a "partial fill". The law allows the pharmacy to complete that prescription within 72 hours, without requiring a new prescription. If they do not fill the rest of your prescription within those 72 hours, you will need a separate prescription to fill the remaining amount, which we will NOT provide. If the reason for the partial fill is your insurance, you will need to talk to the pharmacist about payment alternatives for the remaining tablets, but again, DO NOT ACCEPT A PARTIAL FILL, unless you can trust your pharmacist to obtain the remainder of the pills within 72 hours.  Prescription refills and/or changes in medication(s):  Prescription refills, and/or changes in dose or medication, will be conducted only during scheduled medication management appointments. (Applies to both, written and electronic prescriptions.) No refills on procedure days. No  medication will be changed or started on procedure days. No changes, adjustments, and/or refills will be conducted on a procedure day. Doing so will interfere with the diagnostic portion of the procedure. No phone refills. No medications will be "called into the pharmacy". No Fax refills. No weekend refills. No Holliday refills. No after hours refills.  Remember:  Business hours are:  Monday to Thursday 8:00 AM to 4:00 PM Provider's Schedule: Heith Haigler, MD - Appointments are:  Medication management: Monday and Wednesday 8:00 AM to 4:00 PM Procedure day: Tuesday and Thursday 7:30 AM to 4:00 PM Bilal Lateef, MD - Appointments are:  Medication management: Tuesday and Thursday 8:00 AM to 4:00 PM Procedure day: Monday and Wednesday 7:30 AM to 4:00 PM (Last update: 12/15/2021) ______________________________________________________________________    ____________________________________________________________________________________________  Drug Holidays  What is a "Drug Holiday"? Drug Holiday: is the name given to the process of slowly tapering down and temporarily stopping the pain medication for the purpose of decreasing or eliminating tolerance to the drug.  Benefits Improved effectiveness Decreased required effective dose Improved pain control End dependence on high dose therapy Decrease cost of therapy Uncovering "opioid-induced hyperalgesia". (OIH)  What is "opioid hyperalgesia"? It is a paradoxical increase in pain caused by exposure to opioids. Stopping the opioid pain medication, contrary to the expected, it actually decreases or completely eliminates the pain. Ref.: "A comprehensive review of opioid-induced hyperalgesia". Marion Lee, et.al. Pain Physician. 2011 Mar-Apr;14(2):145-61.  What is tolerance? Tolerance: the progressive loss of effectiveness of a pain medicine due to repetitive use. A common problem of opioid pain medications.  How long should a "Drug  Holiday" last? Effectiveness depends on the patient staying off all opioid pain medicines for a minimum of 14 consecutive days. (2 weeks)  How about just taking less of the medicine? Does not   work. Will not accomplish goal of eliminating the excess receptors.  How about switching to a different pain medicine? (AKA. "Opioid rotation") Does not work. Creates the illusion of effectiveness by taking advantage of inaccurate equivalent dose calculations between different opioids. -This "technique" was promoted by studies funded by pharmaceutical companies, such as [employer], creators of "OxyContin".  Can I stop the medicine "cold turkey"? We do not recommend it. You should always coordinate with your prescribing physician to make the transition as smoothly as possible. Avoid stopping the medicine abruptly without consulting. We recommend a "slow taper".  What is a slow taper? Taper: refers to the gradual decrease in dose.   How do I stop/taper the dose? Slowly. Decrease the daily amount of pills that you take by one (1) pill every seven (7) days. This is called a "slow downward taper". Example: if you normally take four (4) pills per day, drop it to three (3) pills per day for seven (7) days, then to two (2) pills per day for seven (7) days, then to one (1) per day for seven (7) days, and then stop the medicine. The 14 day "Drug Holiday" starts on the first day without medicine.   Will I experience withdrawals? Unlikely with a slow taper.  What triggers withdrawals? Withdrawals are triggered by the sudden/abrupt stop of high dose opioids. Withdrawals can be avoided by slowly decreasing the dose over a prolonged period of time.  What are withdrawals? Symptoms associated with sudden/abrupt reduction/stopping of high-dose, long-term use of pain medication. Withdrawal are seldom seen on low dose therapy, or patients rarely taking opioid medication.  Early Withdrawal Symptoms may  include: Agitation Anxiety Muscle aches Increased tearing Insomnia Runny nose Sweating Yawning  Late symptoms may include: Abdominal cramping Diarrhea Dilated pupils Goose bumps Nausea Vomiting  When could I see withdrawals? Onset: 8-24 hours after last use for most opioids. 12-48 hours for long-acting opioids (i.e.: methadone)  How long could they last? Duration: 4-10 days for most opioids. 14-21 days for long-acting opioids (i.e.: methadone)  What will happen after I complete my "Drug Holiday"? The need and indications for the opioid analgesic will be reviewed before restarting the medication. Dose requirements will likely decrease and the dose will need to be adjusted accordingly.   (Last update: 05/12/2022) ____________________________________________________________________________________________    ____________________________________________________________________________________________  WARNING: CBD (cannabidiol) & Delta (Delta-8 tetrahydrocannabinol) products.   Applicable to:  All individuals currently taking or considering taking CBD (cannabidiol) and, more important, all patients taking opioid analgesic controlled substances (pain medication). (Example: oxycodone; oxymorphone; hydrocodone; hydromorphone; morphine; methadone; tramadol; tapentadol; fentanyl; buprenorphine; butorphanol; dextromethorphan; meperidine; codeine; etc.)  Introduction:  Recently there has been a drive towards the use of "natural" products for the treatment of different conditions, including pain anxiety and sleep disorders. Marijuana and hemp are two varieties of the cannabis genus plants. Marijuana and its derivatives are illegal, while hemp and its derivatives are not. Cannabidiol (CBD) and tetrahydrocannabinol (THC), are two natural compounds found in plants of the Cannabis genus. They can both be extracted from hemp or marijuana. Both compounds interact with your body's endocannabinoid  system in very different ways. CBD is associated with pain relief (analgesia) while THC is associated with the psychoactive effects ("the high") obtained from the use of marijuana products. There are two main types of THC: Delta-9, which comes from the marijuana plant and it is illegal, and Delta-8, which comes from the hemp plant, and it is legal. (Both, Delta-9-THC and Delta-8-THC are psychoactive and   give you "the high".)   Legality:  Marijuana and its derivatives: illegal Hemp and its derivatives: Legal (State dependent) UPDATE: (04/10/2021) The Drug Enforcement Agency (DEA) issued a letter stating that "delta" cannabinoids, including Delta-8-THCO and Delta-9-THCO, synthetically derived from hemp do not qualify as hemp and will be viewed as Schedule I drugs. (Schedule I drugs, substances, or chemicals are defined as drugs with no currently accepted medical use and a high potential for abuse. Some examples of Schedule I drugs are: heroin, lysergic acid diethylamide (LSD), marijuana (cannabis), 3,4-methylenedioxymethamphetamine (ecstasy), methaqualone, and peyote.) (https://www.dea.gov)  Legal status of CBD in [place]:  "Conditionally Legal"  Reference: "FDA Regulation of Cannabis and Cannabis-Derived Products, Including Cannabidiol (CBD)" - https://www.fda.gov/news-events/public-health-focus/fda-regulation-cannabis-and-cannabis-derived-products-including-cannabidiol-cbd  Warning:  CBD is not FDA approved and has not undergo the same manufacturing controls as prescription drugs.  This means that the purity and safety of available CBD may be questionable. Most of the time, despite manufacturer's claims, it is contaminated with THC (delta-9-tetrahydrocannabinol - the chemical in marijuana responsible for the "HIGH").  When this is the case, the THC contaminant will trigger a positive urine drug screen (UDS) test for Marijuana (carboxy-THC).   The FDA recently put out a warning about 5 things that everyone  should be aware of regarding Delta-8 THC: Delta-8 THC products have not been evaluated or approved by the FDA for safe use and may be marketed in ways that put the public health at risk. The FDA has received adverse event reports involving delta-8 THC-containing products. Delta-8 THC has psychoactive and intoxicating effects. Delta-8 THC manufacturing often involve use of potentially harmful chemicals to create the concentrations of delta-8 THC claimed in the marketplace. The final delta-8 THC product may have potentially harmful by-products (contaminants) due to the chemicals used in the process. Manufacturing of delta-8 THC products may occur in uncontrolled or unsanitary settings, which may lead to the presence of unsafe contaminants or other potentially harmful substances. Delta-8 THC products should be kept out of the reach of children and pets.  NOTE: Because a positive UDS for any illicit substance is a violation of our medication agreement, your opioid analgesics (pain medicine) may be permanently discontinued.  MORE ABOUT CBD  General Information: CBD was discovered in 1940 and it is a derivative of the cannabis sativa genus plants (Marijuana and Hemp). It is one of the 113 identified substances found in Marijuana. It accounts for up to 40% of the plant's extract. As of 2018, preliminary clinical studies on CBD included research for the treatment of anxiety, movement disorders, and pain. CBD is available and consumed in multiple forms, including inhalation of smoke or vapor, as an aerosol spray, and by mouth. It may be supplied as an oil containing CBD, capsules, dried cannabis, or as a liquid solution. CBD is thought not to be as psychoactive as THC (delta-9-tetrahydrocannabinol - the chemical in marijuana responsible for the "HIGH"). Studies suggest that CBD may interact with different biological target receptors in the body, including cannabinoid and other neurotransmitter receptors. As of  2018 the mechanism of action for its biological effects has not been determined.  Side-effects  Adverse reactions: Dry mouth, diarrhea, decreased appetite, fatigue, drowsiness, malaise, weakness, sleep disturbances, and others.  Drug interactions:  CBD may interact with medications such as blood-thinners. CBD causes drowsiness on its own and it will increase drowsiness caused by other medications, including antihistamines (such as Benadryl), benzodiazepines (Xanax, Ativan, Valium), antipsychotics, antidepressants, opioids, alcohol and supplements such as kava, melatonin and St. John's Wort.    Other drug interactions: Brivaracetam (Briviact); Caffeine; Carbamazepine (Tegretol); Citalopram (Celexa); Clobazam (Onfi); Eslicarbazepine (Aptiom); Everolimus (Zostress); Lithium; Methadone (Dolophine); Rufinamide (Banzel); Sedative medications (CNS depressants); Sirolimus (Rapamune); Stiripentol (Diacomit); Tacrolimus (Prograf); Tamoxifen ; Soltamox); Topiramate (Topamax); Valproate; Warfarin (Coumadin); Zonisamide. (Last update: 02/01/2022) ____________________________________________________________________________________________   ____________________________________________________________________________________________  Naloxone Nasal Spray  Why am I receiving this medication? [place] STOP ACT requires that all patients taking high dose opioids or at risk of opioids respiratory depression, be prescribed an opioid reversal agent, such as Naloxone (AKA: Narcan).  What is this medication? NALOXONE (nal OX one) treats opioid overdose, which causes slow or shallow breathing, severe drowsiness, or trouble staying awake. Call emergency services after using this medication. You may need additional treatment. Naloxone works by reversing the effects of opioids. It belongs to a group of medications called opioid blockers.  COMMON BRAND NAME(S): Kloxxado, Narcan  What should I tell my care team before  I take this medication? They need to know if you have any of these conditions: Heart disease Substance use disorder An unusual or allergic reaction to naloxone, other medications, foods, dyes, or preservatives Pregnant or trying to get pregnant Breast-feeding  When to use this medication? This medication is to be used for the treatment of respiratory depression (less than 8 breaths per minute) secondary to opioid overdose.   How to use this medication? This medication is for use in the nose. Lay the person on their back. Support their neck with your hand and allow the head to tilt back before giving the medication. The nasal spray should be given into 1 nostril. After giving the medication, move the person onto their side. Do not remove or test the nasal spray until ready to use. Get emergency medical help right away after giving the first dose of this medication, even if the person wakes up. You should be familiar with how to recognize the signs and symptoms of a narcotic overdose. If more doses are needed, give the additional dose in the other nostril. Talk to your care team about the use of this medication in children. While this medication may be prescribed for children as young as newborns for selected conditions, precautions do apply.  Naloxone Overdosage: If you think you have taken too much of this medicine contact a poison control center or emergency room at once.  NOTE: This medicine is only for you. Do not share this medicine with others.  What if I miss a dose? This does not apply.  What may interact with this medication? This is only used during an emergency. No interactions are expected during emergency use. This list may not describe all possible interactions. Give your health care provider a list of all the medicines, herbs, non-prescription drugs, or dietary supplements you use. Also tell them if you smoke, drink alcohol, or use illegal drugs. Some items may interact with  your medicine.  What should I watch for while using this medication? Keep this medication ready for use in the case of an opioid overdose. Make sure that you have the phone number of your care team and local hospital ready. You may need to have additional doses of this medication. Each nasal spray contains a single dose. Some emergencies may require additional doses. After use, bring the treated person to the nearest hospital or call 911. Make sure the treating care team knows that the person has received a dose of this medication. You will receive additional instructions on what to do during and after use of this   medication before an emergency occurs.  What side effects may I notice from receiving this medication? Side effects that you should report to your care team as soon as possible: Allergic reactions--skin rash, itching, hives, swelling of the face, lips, tongue, or throat Side effects that usually do not require medical attention (report these to your care team if they continue or are bothersome): Constipation Dryness or irritation inside the nose Headache Increase in blood pressure Muscle spasms Stuffy nose Toothache This list may not describe all possible side effects. Call your doctor for medical advice about side effects. You may report side effects to FDA at 1-800-FDA-1088.  Where should I keep my medication? Because this is an emergency medication, you should keep it with you at all times.  Keep out of the reach of children and pets. Store between 20 and 25 degrees C (68 and 77 degrees F). Do not freeze. Throw away any unused medication after the expiration date. Keep in original box until ready to use.  NOTE: This sheet is a summary. It may not cover all possible information. If you have questions about this medicine, talk to your doctor, pharmacist, or health care provider.   2023 [employer] (2020-10-17  00:00:00)  ____________________________________________________________________________________________   

## 2022-06-09 NOTE — Progress Notes (Signed)
(  06/09/2022) patient called to cancel on appointment day (NO-SHOW) indicating that she was sick.  She was also NO-SHOW to the 06/02/2022 appointment.

## 2022-06-15 NOTE — Patient Instructions (Signed)
____________________________________________________________________________________________  Opioid Pain Medication Update  To: All patients taking opioid pain medications. (I.e.: hydrocodone, hydromorphone, oxycodone, oxymorphone, morphine, codeine, methadone, tapentadol, tramadol, buprenorphine, fentanyl, etc.)  Re: Updated review of side effects and adverse reactions of opioid analgesics, as well as new information about long term effects of this class of medications.  Direct risks of long-term opioid therapy are not limited to opioid addiction and overdose. Potential medical risks include serious fractures, breathing problems during sleep, hyperalgesia, immunosuppression, chronic constipation, bowel obstruction, myocardial infarction, and tooth decay secondary to xerostomia.  Unpredictable adverse effects that can occur even if you take your medication correctly: Cognitive impairment, respiratory depression, and death. Most people think that if they take their medication "correctly", and "as instructed", that they will be safe. Nothing could be farther from the truth. In reality, a significant amount of recorded deaths associated with the use of opioids has occurred in individuals that had taken the medication for a long time, and were taking their medication correctly. The following are examples of how this can happen: Patient taking his/her medication for a long time, as instructed, without any side effects, is given a certain antibiotic or another unrelated medication, which in turn triggers a "Drug-to-drug interaction" leading to disorientation, cognitive impairment, impaired reflexes, respiratory depression or an untoward event leading to serious bodily harm or injury, including death.  Patient taking his/her medication for a long time, as instructed, without any side effects, develops an acute impairment of liver and/or kidney function. This will lead to a rapid inability of the body to  breakdown and eliminate their pain medication, which will result in effects similar to an "overdose", but with the same medicine and dose that they had always taken. This again may lead to disorientation, cognitive impairment, impaired reflexes, respiratory depression or an untoward event leading to serious bodily harm or injury, including death.  A similar problem will occur with patients as they grow older and their liver and kidney function begins to decrease as part of the aging process.  Background information: Historically, the original case for using long-term opioid therapy to treat chronic noncancer pain was based on safety assumptions that subsequent experience has called into question. In 1996, the American Pain Society and the American Academy of Pain Medicine issued a consensus statement supporting long-term opioid therapy. This statement acknowledged the dangers of opioid prescribing but concluded that the risk for addiction was low; respiratory depression induced by opioids was short-lived, occurred mainly in opioid-naive patients, and was antagonized by pain; tolerance was not a common problem; and efforts to control diversion should not constrain opioid prescribing. This has now proven to be wrong. Experience regarding the risks for opioid addiction, misuse, and overdose in community practice has failed to support these assumptions.  According to the Centers for Disease Control and Prevention, fatal overdoses involving opioid analgesics have increased sharply over the past decade. Currently, more than 96,700 people die from drug overdoses every year. Opioids are a factor in 7 out of every 10 overdose deaths. Deaths from drug overdose have surpassed motor vehicle accidents as the leading cause of death for individuals between the ages of 35 and 54.  Clinical data suggest that neuroendocrine dysfunction may be very common in both men and women, potentially causing hypogonadism, erectile  dysfunction, infertility, decreased libido, osteoporosis, and depression. Recent studies linked higher opioid dose to increased opioid-related mortality. Controlled observational studies reported that long-term opioid therapy may be associated with increased risk for cardiovascular events. Subsequent meta-analysis concluded   that the safety of long-term opioid therapy in elderly patients has not been proven.   Side Effects and adverse reactions: Common side effects: Drowsiness (sedation). Dizziness. Nausea and vomiting. Constipation. Physical dependence -- Dependence often manifests with withdrawal symptoms when opioids are discontinued or decreased. Tolerance -- As you take repeated doses of opioids, you require increased medication to experience the same effect of pain relief. Respiratory depression -- This can occur in healthy people, especially with higher doses. However, people with COPD, asthma or other lung conditions may be even more susceptible to fatal respiratory impairment.  Uncommon side effects: An increased sensitivity to feeling pain and extreme response to pain (hyperalgesia). Chronic use of opioids can lead to this. Delayed gastric emptying (the process by which the contents of your stomach are moved into your small intestine). Muscle rigidity. Immune system and hormonal dysfunction. Quick, involuntary muscle jerks (myoclonus). Arrhythmia. Itchy skin (pruritus). Dry mouth (xerostomia).  Long-term side effects: Chronic constipation. Sleep-disordered breathing (SDB). Increased risk of bone fractures. Hypothalamic-pituitary-adrenal dysregulation. Increased risk of overdose.  RISKS: Fractures and Falls:  Opioids increase the risk and incidence of falls. This is of particular importance in elderly patients.  Endocrine System:  Long-term administration is associated with endocrine abnormalities (endocrinopathies). (Also known as Opioid-induced Endocrinopathy) Influences  on both the hypothalamic-pituitary-adrenal axis?and the hypothalamic-pituitary-gonadal axis have been demonstrated with consequent hypogonadism and adrenal insufficiency in both sexes. Hypogonadism and decreased levels of dehydroepiandrosterone sulfate have been reported in men and women. Endocrine effects include: Amenorrhoea in women (abnormal absence of menstruation) Reduced libido in both sexes Decreased sexual function Erectile dysfunction in men Hypogonadisms (decreased testicular function with shrinkage of testicles) Infertility Depression and fatigue Loss of muscle mass Anxiety Depression Immune suppression Hyperalgesia Weight gain Anemia Osteoporosis Patients (particularly women of childbearing age) should avoid opioids. There is insufficient evidence to recommend routine monitoring of asymptomatic patients taking opioids in the long-term for hormonal deficiencies.  Immune System: Human studies have demonstrated that opioids have an immunomodulating effect. These effects are mediated via opioid receptors both on immune effector cells and in the central nervous system. Opioids have been demonstrated to have adverse effects on antimicrobial response and anti-tumour surveillance. Buprenorphine has been demonstrated to have no impact on immune function.  Opioid Induced Hyperalgesia: Human studies have demonstrated that prolonged use of opioids can lead to a state of abnormal pain sensitivity, sometimes called opioid induced hyperalgesia (OIH). Opioid induced hyperalgesia is not usually seen in the absence of tolerance to opioid analgesia. Clinically, hyperalgesia may be diagnosed if the patient on long-term opioid therapy presents with increased pain. This might be qualitatively and anatomically distinct from pain related to disease progression or to breakthrough pain resulting from development of opioid tolerance. Pain associated with hyperalgesia tends to be more diffuse than the  pre-existing pain and less defined in quality. Management of opioid induced hyperalgesia requires opioid dose reduction.  Cancer: Chronic opioid therapy has been associated with an increased risk of cancer among noncancer patients with chronic pain. This association was more evident in chronic strong opioid users. Chronic opioid consumption causes significant pathological changes in the small intestine and colon. Epidemiological studies have found that there is a link between opium dependence and initiation of gastrointestinal cancers. Cancer is the second leading cause of death after cardiovascular disease. Chronic use of opioids can cause multiple conditions such as GERD, immunosuppression and renal damage as well as carcinogenic effects, which are associated with the incidence of cancers.   Mortality: Long-term opioid use   has been associated with increased mortality among patients with chronic non-cancer pain (CNCP).  Prescription of long-acting opioids for chronic noncancer pain was associated with a significantly increased risk of all-cause mortality, including deaths from causes other than overdose.  Reference: Von Korff M, Kolodny A, Deyo RA, Chou R. Long-term opioid therapy reconsidered. Ann Intern Med. 2011 Sep 6;155(5):325-8. doi: 10.7326/[id number]. PMID: 21893626; PMCID: PMC3280085. Bedson J, Chen Y, Ashworth J, Hayward RA, Dunn KM, Jordan KP. Risk of adverse events in patients prescribed long-term opioids: A cohort study in the [place] Clinical Practice Research Datalink. Eur J Pain. 2019 May;23(5):908-922. doi: 10.1002/ejp.1357. Epub 2019 Jan 31. PMID: 30620116. Colameco S, Coren JS, Ciervo CA. Continuous opioid treatment for chronic noncancer pain: a time for moderation in prescribing. Postgrad Med. 2009 Jul;121(4):61-6. doi: 10.3810/pgm.2009.07.2032. PMID: 19641271. Chou R, Turner JA, Devine EB, Hansen RN, Sullivan SD, Blazina I, Dana T, Bougatsos C, Deyo RA. The  effectiveness and risks of long-term opioid therapy for chronic pain: a systematic review for a National Institutes of Health Pathways to Prevention Workshop. Ann Intern Med. 2015 Feb 17;162(4):276-86. doi: 10.7326/M14-2559. PMID: 25581257. Warner M, Chen LH, Makuc DM. NCHS Data Brief No. 22. [place]: Centers for Disease Control and Prevention; 2009. Sep, Increase in Fatal Poisonings Involving Opioid Analgesics in the [place], 1999-2006. Song IA, Choi HR, Oh TK. Long-term opioid use and mortality in patients with chronic non-cancer pain: Ten-year follow-up study in [name] from 2010 through 2019. EClinicalMedicine. 2022 Jul 18;51:101558. doi: 10.1016/j.eclinm.2022.101558. PMID: 35875817; PMCID: PMC9304910. Huser, W., Schubert, T., Vogelmann, T. et al. All-cause mortality in patients with long-term opioid therapy compared with non-opioid analgesics for chronic non-cancer pain: a database study. BMC Med 18, 162 (2020). https://doi.org/10.1186/[email or web address] Rashidian H, Zendehdel K, Kamangar F, Malekzadeh R, Haghdoost AA. An Ecological Study of the Association between Opiate Use and Incidence of Cancers. Addict Health. 2016 Fall;8(4):252-260. PMID: 28819556; PMCID: PMC5554805.  Our Goal: Our goal is to control your pain with means other than the use of opioid pain medications.  Our Recommendation: Talk to your physician about coming off of these medications. We can assist you with the tapering down and stopping these medicines. Based on the new information, even if you cannot completely stop the medication, a decrease in the dose may be associated with a lesser risk. Ask for other means of controlling the pain. Decrease or eliminate those factors that significantly contribute to your pain such as smoking, obesity, and a diet heavily tilted towards "inflammatory" nutrients.  Last Updated: 04/21/2022    ____________________________________________________________________________________________     ____________________________________________________________________________________________  Patient Information update  To: All of our patients.  Re: Name change.  It has been made official that our current name, "[facility]"   will soon be changed to "[facility]".   The purpose of this change is to eliminate any confusion created by the concept of our practice being a "Medication Management Pain Clinic". In the past this has led to the misconception that we treat pain primarily by the use of prescription medications.  Nothing can be farther from the truth.   Understanding PAIN MANAGEMENT: To further understand what our practice does, you first have to understand that "Pain Management" is a subspecialty that requires additional training once a physician has completed their specialty training, which can be in either Anesthesia, Neurology, Psychiatry, or Physical Medicine and Rehabilitation (PMR). Each one of these contributes to the final approach taken by each physician to   the management of their patient's pain. To be a "Pain Management Specialist" you must have first completed one of the specialty trainings below.  Anesthesiologists - trained in clinical pharmacology and interventional techniques such as nerve blockade and regional as well as central neuroanatomy. They are trained to block pain before, during, and after surgical interventions.  Neurologists - trained in the diagnosis and pharmacological treatment of complex neurological conditions, such as Multiple Sclerosis, Parkinson's, spinal cord injuries, and other systemic conditions that may be associated with symptoms that may include but are not limited to pain. They tend to rely primarily on the treatment of chronic pain  using prescription medications.  Psychiatrist - trained in conditions affecting the psychosocial wellbeing of patients including but not limited to depression, anxiety, schizophrenia, personality disorders, addiction, and other substance use disorders that may be associated with chronic pain. They tend to rely primarily on the treatment of chronic pain using prescription medications.   Physical Medicine and Rehabilitation (PMR) physicians, also known as physiatrists - trained to treat a wide variety of medical conditions affecting the brain, spinal cord, nerves, bones, joints, ligaments, muscles, and tendons. Their training is primarily aimed at treating patients that have suffered injuries that have caused severe physical impairment. Their training is primarily aimed at the physical therapy and rehabilitation of those patients. They may also work alongside orthopedic surgeons or neurosurgeons using their expertise in assisting surgical patients to recover after their surgeries.  INTERVENTIONAL PAIN MANAGEMENT is sub-subspecialty of Pain Management.  Our physicians are Board-certified in Anesthesia, Pain Management, and Interventional Pain Management.  This meaning that not only have they been trained and Board-certified in their specialty of Anesthesia, and subspecialty of Pain Management, but they have also received further training in the sub-subspecialty of Interventional Pain Management, in order to become Board-certified as INTERVENTIONAL PAIN MANAGEMENT SPECIALIST.    Mission: Our goal is to use our skills in  INTERVENTIONAL PAIN MANAGEMENT as alternatives to the chronic use of prescription opioid medications for the treatment of pain. To make this more clear, we have changed our name to reflect what we do and offer. We will continue to offer medication management assessment and recommendations, but we will not be taking over any patient's medication  management.  ____________________________________________________________________________________________     ____________________________________________________________________________________________  National Pain Medication Shortage  The [place] is experiencing worsening drug shortages. These have had a negative widespread effect on patient care and treatment. Not expected to improve any time soon. Predicted to last past 2029.   Drug shortage list (generic names) Oxycodone IR Oxycodone/APAP Oxymorphone IR Hydromorphone Hydrocodone/APAP Morphine  Where is the problem?  Manufacturing and supply level.  Will this shortage affect you?  Only if you take any of the above pain medications.  How? You may be unable to fill your prescription.  Your pharmacist may offer a "partial fill" of your prescription. (Warning: Do not accept partial fills.) Prescriptions partially filled cannot be transferred to another pharmacy. Read our Medication Rules and Regulation. Depending on how much medicine you are dependent on, you may experience withdrawals when unable to get the medication.  Recommendations: Consider ending your dependence on opioid pain medications. Ask your pain specialist to assist you with the process. Consider switching to a medication currently not in shortage, such as Buprenorphine. Talk to your pain specialist about this option. Consider decreasing your pain medication requirements by managing tolerance thru "Drug Holidays". This may help minimize withdrawals, should you run out of medicine. Control your pain thru   the use of non-pharmacological interventional therapies.   Your prescriber: Prescribers cannot be blamed for shortages. Medication manufacturing and supply issues cannot be fixed by the prescriber.   NOTE: The prescriber is not responsible for supplying the medication, or solving supply issues. Work with your pharmacist to solve it. The patient is responsible for  the decision to take or continue taking the medication and for identifying and securing a legal supply source. By law, supplying the medication is the job and responsibility of the pharmacy. The prescriber is responsible for the evaluation, monitoring, and prescribing of these medications.   Prescribers will NOT: Re-issue prescriptions that have been partially filled. Re-issue prescriptions already sent to a pharmacy.  Re-send prescriptions to a different pharmacy because yours did not have your medication. Ask pharmacist to order more medicine or transfer the prescription to another pharmacy. (Read below.)  New 2023 regulation: "October 23, 2021 Revised Regulation Allows DEA-Registered Pharmacies to Transfer Electronic Prescriptions at a Patient's Request DEA Headquarters Division - Public Information Office Patients now have the ability to request their electronic prescription be transferred to another pharmacy without having to go back to their practitioner to initiate the request. This revised regulation went into effect on Monday, October 19, 2021.     At a patient's request, a DEA-registered retail pharmacy can now transfer an electronic prescription for a controlled substance (schedules II-V) to another DEA-registered retail pharmacy. Prior to this change, patients would have to go through their practitioner to cancel their prescription and have it re-issued to a different pharmacy. The process was taxing and time consuming for both patients and practitioners.    The Drug Enforcement Administration (DEA) published its intent to revise the process for transferring electronic prescriptions on January 11, 2020.  The final rule was published in the federal register on September 17, 2021 and went into effect 30 days later.  Under the final rule, a prescription can only be transferred once between pharmacies, and only if allowed under existing state or other applicable law. The prescription must  remain in its electronic form; may not be altered in any way; and the transfer must be communicated directly between two licensed pharmacists. It's important to note, any authorized refills transfer with the original prescription, which means the entire prescription will be filled at the same pharmacy".  Reference: https://www.dea.gov/stories/2023/2023-10/2021-09-01/revised-regulation-allows-dea-registered-pharmacies-transfer (DEA website announcement)  https://www.govinfo.gov/content/pkg/FR-2021-09-17/pdf/2023-15847.pdf (Federal Register  Department of Justice)   Federal Register / Vol. 88, No. 143 / Thursday, September 17, 2021 / Rules and Regulations DEPARTMENT OF JUSTICE  Drug Enforcement Administration  21 CFR Part 1306  [Docket No. [id number]]  RIN [id number]-AB64 Transfer of Electronic Prescriptions for Schedules II-V Controlled Substances Between Pharmacies for Initial Filling  ____________________________________________________________________________________________     ____________________________________________________________________________________________  Transfer of Pain Medication between Pharmacies  Re: 2023 DEA Clarification on existing regulation  Published on DEA Website: October 23, 2021  Title: Revised Regulation Allows DEA-Registered Pharmacies to Transfer Electronic Prescriptions at a Patient's Request DEA Headquarters Division - Public Information Office  "Patients now have the ability to request their electronic prescription be transferred to another pharmacy without having to go back to their practitioner to initiate the request. This revised regulation went into effect on Monday, October 19, 2021.     At a patient's request, a DEA-registered retail pharmacy can now transfer an electronic prescription for a controlled substance (schedules II-V) to another DEA-registered retail pharmacy. Prior to this change, patients would have to go through their practitioner to  cancel their prescription   and have it re-issued to a different pharmacy. The process was taxing and time consuming for both patients and practitioners.    The Drug Enforcement Administration (DEA) published its intent to revise the process for transferring electronic prescriptions on January 11, 2020.  The final rule was published in the federal register on September 17, 2021 and went into effect 30 days later.  Under the final rule, a prescription can only be transferred once between pharmacies, and only if allowed under existing state or other applicable law. The prescription must remain in its electronic form; may not be altered in any way; and the transfer must be communicated directly between two licensed pharmacists. It's important to note, any authorized refills transfer with the original prescription, which means the entire prescription will be filled at the same pharmacy."    REFERENCES: 1. DEA website announcement https://www.dea.gov/stories/2023/2023-10/2021-09-01/revised-regulation-allows-dea-registered-pharmacies-transfer  2. Department of Justice website  https://www.govinfo.gov/content/pkg/FR-2021-09-17/pdf/2023-15847.pdf  3. DEPARTMENT OF JUSTICE Drug Enforcement Administration 21 CFR Part 1306 [Docket No. [id number]] RIN [id number]-AB64 "Transfer of Electronic Prescriptions for Schedules II-V Controlled Substances Between Pharmacies for Initial Filling"  ____________________________________________________________________________________________     _______________________________________________________________________  Medication Rules  Purpose: To inform patients, and their family members, of our medication rules and regulations.  Applies to: All patients receiving prescriptions from our practice (written or electronic).  Pharmacy of record: This is the pharmacy where your electronic prescriptions will be sent. Make sure we have the correct one.  Electronic prescriptions: In  compliance with the [place] Strengthen Opioid Misuse Prevention (STOP) Act of 2017 (Session Law 2017-74/H243), effective February 22, 2018, all controlled substances must be electronically prescribed. Written prescriptions, faxing, or calling prescriptions to a pharmacy will no longer be done.  Prescription refills: These will be provided only during in-person appointments. No medications will be renewed without a "face-to-face" evaluation with your provider. Applies to all prescriptions.  NOTE: The following applies primarily to controlled substances (Opioid* Pain Medications).   Type of encounter (visit): For patients receiving controlled substances, face-to-face visits are required. (Not an option and not up to the patient.)  Patient's responsibilities: Pain Pills: Bring all pain pills to every appointment (except for procedure appointments). Pill Bottles: Bring pills in original pharmacy bottle. Bring bottle, even if empty. Always bring the bottle of the most recent fill.  Medication refills: You are responsible for knowing and keeping track of what medications you are taking and when is it that you will need a refill. The day before your appointment: write a list of all prescriptions that need to be refilled. The day of the appointment: give the list to the admitting nurse. Prescriptions will be written only during appointments. No prescriptions will be written on procedure days. If you forget a medication: it will not be "Called in", "Faxed", or "electronically sent". You will need to get another appointment to get these prescribed. No early refills. Do not call asking to have your prescription filled early. Partial  or short prescriptions: Occasionally your pharmacy may not have enough pills to fill your prescription.  NEVER ACCEPT a partial fill or a prescription that is short of the total amount of pills that you were prescribed.  With controlled substances the law allows 72 hours for  the pharmacy to complete the prescription.  If the prescription is not completed within 72 hours, the pharmacist will require a new prescription to be written. This means that you will be short on your medicine and we WILL NOT send another prescription to complete your original   prescription.  Instead, request the pharmacy to send a carrier to a nearby branch to get enough medication to provide you with your full prescription. Prescription Accuracy: You are responsible for carefully inspecting your prescriptions before leaving our office. Have the discharge nurse carefully go over each prescription with you, before taking them home. Make sure that your name is accurately spelled, that your address is correct. Check the name and dose of your medication to make sure it is accurate. Check the number of pills, and the written instructions to make sure they are clear and accurate. Make sure that you are given enough medication to last until your next medication refill appointment. Taking Medication: Take medication as prescribed. When it comes to controlled substances, taking less pills or less frequently than prescribed is permitted and encouraged. Never take more pills than instructed. Never take the medication more frequently than prescribed.  Inform other Doctors: Always inform, all of your healthcare providers, of all the medications you take. Pain Medication from other Providers: You are not allowed to accept any additional pain medication from any other Doctor or Healthcare provider. There are two exceptions to this rule. (see below) In the event that you require additional pain medication, you are responsible for notifying us, as stated below. Cough Medicine: Often these contain an opioid, such as codeine or hydrocodone. Never accept or take cough medicine containing these opioids if you are already taking an opioid* medication. The combination may cause respiratory failure and death. Medication Agreement:  You are responsible for carefully reading and following our Medication Agreement. This must be signed before receiving any prescriptions from our practice. Safely store a copy of your signed Agreement. Violations to the Agreement will result in no further prescriptions. (Additional copies of our Medication Agreement are available upon request.) Laws, Rules, & Regulations: All patients are expected to follow all Federal and State Laws, Statutes, Rules, & Regulations. Ignorance of the Laws does not constitute a valid excuse.  Illegal drugs and Controlled Substances: The use of illegal substances (including, but not limited to marijuana and its derivatives) and/or the illegal use of any controlled substances is strictly prohibited. Violation of this rule may result in the immediate and permanent discontinuation of any and all prescriptions being written by our practice. The use of any illegal substances is prohibited. Adopted CDC guidelines & recommendations: Target dosing levels will be at or below 60 MME/day. Use of benzodiazepines** is not recommended.  Exceptions: There are only two exceptions to the rule of not receiving pain medications from other Healthcare Providers. Exception #1 (Emergencies): In the event of an emergency (i.e.: accident requiring emergency care), you are allowed to receive additional pain medication. However, you are responsible for: As soon as you are able, call our office [phone], at any time of the day or night, and leave a message stating your name, the date and nature of the emergency, and the name and dose of the medication prescribed. In the event that your call is answered by a member of our staff, make sure to document and save the date, time, and the name of the person that took your information.  Exception #2 (Planned Surgery): In the event that you are scheduled by another doctor or dentist to have any type of surgery or procedure, you are allowed (for a period no  longer than 30 days), to receive additional pain medication, for the acute post-op pain. However, in this case, you are responsible for picking up a copy of   our "Post-op Pain Management for Surgeons" handout, and giving it to your surgeon or dentist. This document is available at our office, and does not require an appointment to obtain it. Simply go to our office during business hours (Monday-Thursday from 8:00 AM to 4:00 PM) (Friday 8:00 AM to 12:00 Noon) or if you have a scheduled appointment with us, prior to your surgery, and ask for it by name. In addition, you are responsible for: calling our office [phone], at any time of the day or night, and leaving a message stating your name, name of your surgeon, type of surgery, and date of procedure or surgery. Failure to comply with your responsibilities may result in termination of therapy involving the controlled substances. Medication Agreement Violation. Following the above rules, including your responsibilities will help you in avoiding a Medication Agreement Violation ("Breaking your Pain Medication Contract").  Consequences:  Not following the above rules may result in permanent discontinuation of medication prescription therapy.  *Opioid medications include: morphine, codeine, oxycodone, oxymorphone, hydrocodone, hydromorphone, meperidine, tramadol, tapentadol, buprenorphine, fentanyl, methadone. **Benzodiazepine medications include: diazepam (Valium), alprazolam (Xanax), clonazepam (Klonopine), lorazepam (Ativan), clorazepate (Tranxene), chlordiazepoxide (Librium), estazolam (Prosom), oxazepam (Serax), temazepam (Restoril), triazolam (Halcion) (Last updated: 12/15/2021) ______________________________________________________________________    ______________________________________________________________________  Medication Recommendations and Reminders  Applies to: All patients receiving prescriptions (written and/or  electronic).  Medication Rules & Regulations: You are responsible for reading, knowing, and following our "Medication Rules" document. These exist for your safety and that of others. They are not flexible and neither are we. Dismissing or ignoring them is an act of "non-compliance" that may result in complete and irreversible termination of such medication therapy. For safety reasons, "non-compliance" will not be tolerated. As with the [place] fundamental legal principle of "ignorance of the law is no defense", we will accept no excuses for not having read and knowing the content of documents provided to you by our practice.  Pharmacy of record:  Definition: This is the pharmacy where your electronic prescriptions will be sent.  We do not endorse any particular pharmacy. It is up to you and your insurance to decide what pharmacy to use.  We do not restrict you in your choice of pharmacy. However, once we write for your prescriptions, we will NOT be re-sending more prescriptions to fix restricted supply problems created by your pharmacy, or your insurance.  The pharmacy listed in the electronic medical record should be the one where you want electronic prescriptions to be sent. If you choose to change pharmacy, simply notify our nursing staff. Changes will be made only during your regular appointments and not over the phone.  Recommendations: Keep all of your pain medications in a safe place, under lock and key, even if you live alone. We will NOT replace lost, stolen, or damaged medication. We do not accept "Police Reports" as proof of medications having been stolen. After you fill your prescription, take 1 week's worth of pills and put them away in a safe place. You should keep a separate, properly labeled bottle for this purpose. The remainder should be kept in the original bottle. Use this as your primary supply, until it runs out. Once it's gone, then you know that you have 1 week's worth of medicine,  and it is time to come in for a prescription refill. If you do this correctly, it is unlikely that you will ever run out of medicine. To make sure that the above recommendation works, it is very important that you make   sure your medication refill appointments are scheduled at least 1 week before you run out of medicine. To do this in an effective manner, make sure that you do not leave the office without scheduling your next medication management appointment. Always ask the nursing staff to show you in your prescription , when your medication will be running out. Then arrange for the receptionist to get you a return appointment, at least 7 days before you run out of medicine. Do not wait until you have 1 or 2 pills left, to come in. This is very poor planning and does not take into consideration that we may need to cancel appointments due to bad weather, sickness, or emergencies affecting our staff. DO NOT ACCEPT A "Partial Fill": If for any reason your pharmacy does not have enough pills/tablets to completely fill or refill your prescription, do not allow for a "partial fill". The law allows the pharmacy to complete that prescription within 72 hours, without requiring a new prescription. If they do not fill the rest of your prescription within those 72 hours, you will need a separate prescription to fill the remaining amount, which we will NOT provide. If the reason for the partial fill is your insurance, you will need to talk to the pharmacist about payment alternatives for the remaining tablets, but again, DO NOT ACCEPT A PARTIAL FILL, unless you can trust your pharmacist to obtain the remainder of the pills within 72 hours.  Prescription refills and/or changes in medication(s):  Prescription refills, and/or changes in dose or medication, will be conducted only during scheduled medication management appointments. (Applies to both, written and electronic prescriptions.) No refills on procedure days. No  medication will be changed or started on procedure days. No changes, adjustments, and/or refills will be conducted on a procedure day. Doing so will interfere with the diagnostic portion of the procedure. No phone refills. No medications will be "called into the pharmacy". No Fax refills. No weekend refills. No Holliday refills. No after hours refills.  Remember:  Business hours are:  Monday to Thursday 8:00 AM to 4:00 PM Provider's Schedule: Greysyn Vanderberg, MD - Appointments are:  Medication management: Monday and Wednesday 8:00 AM to 4:00 PM Procedure day: Tuesday and Thursday 7:30 AM to 4:00 PM Bilal Lateef, MD - Appointments are:  Medication management: Tuesday and Thursday 8:00 AM to 4:00 PM Procedure day: Monday and Wednesday 7:30 AM to 4:00 PM (Last update: 12/15/2021) ______________________________________________________________________    ____________________________________________________________________________________________  Drug Holidays  What is a "Drug Holiday"? Drug Holiday: is the name given to the process of slowly tapering down and temporarily stopping the pain medication for the purpose of decreasing or eliminating tolerance to the drug.  Benefits Improved effectiveness Decreased required effective dose Improved pain control End dependence on high dose therapy Decrease cost of therapy Uncovering "opioid-induced hyperalgesia". (OIH)  What is "opioid hyperalgesia"? It is a paradoxical increase in pain caused by exposure to opioids. Stopping the opioid pain medication, contrary to the expected, it actually decreases or completely eliminates the pain. Ref.: "A comprehensive review of opioid-induced hyperalgesia". Marion Lee, et.al. Pain Physician. 2011 Mar-Apr;14(2):145-61.  What is tolerance? Tolerance: the progressive loss of effectiveness of a pain medicine due to repetitive use. A common problem of opioid pain medications.  How long should a "Drug  Holiday" last? Effectiveness depends on the patient staying off all opioid pain medicines for a minimum of 14 consecutive days. (2 weeks)  How about just taking less of the medicine? Does not   work. Will not accomplish goal of eliminating the excess receptors.  How about switching to a different pain medicine? (AKA. "Opioid rotation") Does not work. Creates the illusion of effectiveness by taking advantage of inaccurate equivalent dose calculations between different opioids. -This "technique" was promoted by studies funded by pharmaceutical companies, such as [employer], creators of "OxyContin".  Can I stop the medicine "cold turkey"? We do not recommend it. You should always coordinate with your prescribing physician to make the transition as smoothly as possible. Avoid stopping the medicine abruptly without consulting. We recommend a "slow taper".  What is a slow taper? Taper: refers to the gradual decrease in dose.   How do I stop/taper the dose? Slowly. Decrease the daily amount of pills that you take by one (1) pill every seven (7) days. This is called a "slow downward taper". Example: if you normally take four (4) pills per day, drop it to three (3) pills per day for seven (7) days, then to two (2) pills per day for seven (7) days, then to one (1) per day for seven (7) days, and then stop the medicine. The 14 day "Drug Holiday" starts on the first day without medicine.   Will I experience withdrawals? Unlikely with a slow taper.  What triggers withdrawals? Withdrawals are triggered by the sudden/abrupt stop of high dose opioids. Withdrawals can be avoided by slowly decreasing the dose over a prolonged period of time.  What are withdrawals? Symptoms associated with sudden/abrupt reduction/stopping of high-dose, long-term use of pain medication. Withdrawal are seldom seen on low dose therapy, or patients rarely taking opioid medication.  Early Withdrawal Symptoms may  include: Agitation Anxiety Muscle aches Increased tearing Insomnia Runny nose Sweating Yawning  Late symptoms may include: Abdominal cramping Diarrhea Dilated pupils Goose bumps Nausea Vomiting  When could I see withdrawals? Onset: 8-24 hours after last use for most opioids. 12-48 hours for long-acting opioids (i.e.: methadone)  How long could they last? Duration: 4-10 days for most opioids. 14-21 days for long-acting opioids (i.e.: methadone)  What will happen after I complete my "Drug Holiday"? The need and indications for the opioid analgesic will be reviewed before restarting the medication. Dose requirements will likely decrease and the dose will need to be adjusted accordingly.   (Last update: 05/12/2022) ____________________________________________________________________________________________    ____________________________________________________________________________________________  WARNING: CBD (cannabidiol) & Delta (Delta-8 tetrahydrocannabinol) products.   Applicable to:  All individuals currently taking or considering taking CBD (cannabidiol) and, more important, all patients taking opioid analgesic controlled substances (pain medication). (Example: oxycodone; oxymorphone; hydrocodone; hydromorphone; morphine; methadone; tramadol; tapentadol; fentanyl; buprenorphine; butorphanol; dextromethorphan; meperidine; codeine; etc.)  Introduction:  Recently there has been a drive towards the use of "natural" products for the treatment of different conditions, including pain anxiety and sleep disorders. Marijuana and hemp are two varieties of the cannabis genus plants. Marijuana and its derivatives are illegal, while hemp and its derivatives are not. Cannabidiol (CBD) and tetrahydrocannabinol (THC), are two natural compounds found in plants of the Cannabis genus. They can both be extracted from hemp or marijuana. Both compounds interact with your body's endocannabinoid  system in very different ways. CBD is associated with pain relief (analgesia) while THC is associated with the psychoactive effects ("the high") obtained from the use of marijuana products. There are two main types of THC: Delta-9, which comes from the marijuana plant and it is illegal, and Delta-8, which comes from the hemp plant, and it is legal. (Both, Delta-9-THC and Delta-8-THC are psychoactive and   give you "the high".)   Legality:  Marijuana and its derivatives: illegal Hemp and its derivatives: Legal (State dependent) UPDATE: (04/10/2021) The Drug Enforcement Agency (DEA) issued a letter stating that "delta" cannabinoids, including Delta-8-THCO and Delta-9-THCO, synthetically derived from hemp do not qualify as hemp and will be viewed as Schedule I drugs. (Schedule I drugs, substances, or chemicals are defined as drugs with no currently accepted medical use and a high potential for abuse. Some examples of Schedule I drugs are: heroin, lysergic acid diethylamide (LSD), marijuana (cannabis), 3,4-methylenedioxymethamphetamine (ecstasy), methaqualone, and peyote.) (https://www.dea.gov)  Legal status of CBD in [place]:  "Conditionally Legal"  Reference: "FDA Regulation of Cannabis and Cannabis-Derived Products, Including Cannabidiol (CBD)" - https://www.fda.gov/news-events/public-health-focus/fda-regulation-cannabis-and-cannabis-derived-products-including-cannabidiol-cbd  Warning:  CBD is not FDA approved and has not undergo the same manufacturing controls as prescription drugs.  This means that the purity and safety of available CBD may be questionable. Most of the time, despite manufacturer's claims, it is contaminated with THC (delta-9-tetrahydrocannabinol - the chemical in marijuana responsible for the "HIGH").  When this is the case, the THC contaminant will trigger a positive urine drug screen (UDS) test for Marijuana (carboxy-THC).   The FDA recently put out a warning about 5 things that everyone  should be aware of regarding Delta-8 THC: Delta-8 THC products have not been evaluated or approved by the FDA for safe use and may be marketed in ways that put the public health at risk. The FDA has received adverse event reports involving delta-8 THC-containing products. Delta-8 THC has psychoactive and intoxicating effects. Delta-8 THC manufacturing often involve use of potentially harmful chemicals to create the concentrations of delta-8 THC claimed in the marketplace. The final delta-8 THC product may have potentially harmful by-products (contaminants) due to the chemicals used in the process. Manufacturing of delta-8 THC products may occur in uncontrolled or unsanitary settings, which may lead to the presence of unsafe contaminants or other potentially harmful substances. Delta-8 THC products should be kept out of the reach of children and pets.  NOTE: Because a positive UDS for any illicit substance is a violation of our medication agreement, your opioid analgesics (pain medicine) may be permanently discontinued.  MORE ABOUT CBD  General Information: CBD was discovered in 1940 and it is a derivative of the cannabis sativa genus plants (Marijuana and Hemp). It is one of the 113 identified substances found in Marijuana. It accounts for up to 40% of the plant's extract. As of 2018, preliminary clinical studies on CBD included research for the treatment of anxiety, movement disorders, and pain. CBD is available and consumed in multiple forms, including inhalation of smoke or vapor, as an aerosol spray, and by mouth. It may be supplied as an oil containing CBD, capsules, dried cannabis, or as a liquid solution. CBD is thought not to be as psychoactive as THC (delta-9-tetrahydrocannabinol - the chemical in marijuana responsible for the "HIGH"). Studies suggest that CBD may interact with different biological target receptors in the body, including cannabinoid and other neurotransmitter receptors. As of  2018 the mechanism of action for its biological effects has not been determined.  Side-effects  Adverse reactions: Dry mouth, diarrhea, decreased appetite, fatigue, drowsiness, malaise, weakness, sleep disturbances, and others.  Drug interactions:  CBD may interact with medications such as blood-thinners. CBD causes drowsiness on its own and it will increase drowsiness caused by other medications, including antihistamines (such as Benadryl), benzodiazepines (Xanax, Ativan, Valium), antipsychotics, antidepressants, opioids, alcohol and supplements such as kava, melatonin and St. John's Wort.    Other drug interactions: Brivaracetam (Briviact); Caffeine; Carbamazepine (Tegretol); Citalopram (Celexa); Clobazam (Onfi); Eslicarbazepine (Aptiom); Everolimus (Zostress); Lithium; Methadone (Dolophine); Rufinamide (Banzel); Sedative medications (CNS depressants); Sirolimus (Rapamune); Stiripentol (Diacomit); Tacrolimus (Prograf); Tamoxifen ; Soltamox); Topiramate (Topamax); Valproate; Warfarin (Coumadin); Zonisamide. (Last update: 02/01/2022) ____________________________________________________________________________________________   ____________________________________________________________________________________________  Naloxone Nasal Spray  Why am I receiving this medication? [place] STOP ACT requires that all patients taking high dose opioids or at risk of opioids respiratory depression, be prescribed an opioid reversal agent, such as Naloxone (AKA: Narcan).  What is this medication? NALOXONE (nal OX one) treats opioid overdose, which causes slow or shallow breathing, severe drowsiness, or trouble staying awake. Call emergency services after using this medication. You may need additional treatment. Naloxone works by reversing the effects of opioids. It belongs to a group of medications called opioid blockers.  COMMON BRAND NAME(S): Kloxxado, Narcan  What should I tell my care team before  I take this medication? They need to know if you have any of these conditions: Heart disease Substance use disorder An unusual or allergic reaction to naloxone, other medications, foods, dyes, or preservatives Pregnant or trying to get pregnant Breast-feeding  When to use this medication? This medication is to be used for the treatment of respiratory depression (less than 8 breaths per minute) secondary to opioid overdose.   How to use this medication? This medication is for use in the nose. Lay the person on their back. Support their neck with your hand and allow the head to tilt back before giving the medication. The nasal spray should be given into 1 nostril. After giving the medication, move the person onto their side. Do not remove or test the nasal spray until ready to use. Get emergency medical help right away after giving the first dose of this medication, even if the person wakes up. You should be familiar with how to recognize the signs and symptoms of a narcotic overdose. If more doses are needed, give the additional dose in the other nostril. Talk to your care team about the use of this medication in children. While this medication may be prescribed for children as young as newborns for selected conditions, precautions do apply.  Naloxone Overdosage: If you think you have taken too much of this medicine contact a poison control center or emergency room at once.  NOTE: This medicine is only for you. Do not share this medicine with others.  What if I miss a dose? This does not apply.  What may interact with this medication? This is only used during an emergency. No interactions are expected during emergency use. This list may not describe all possible interactions. Give your health care provider a list of all the medicines, herbs, non-prescription drugs, or dietary supplements you use. Also tell them if you smoke, drink alcohol, or use illegal drugs. Some items may interact with  your medicine.  What should I watch for while using this medication? Keep this medication ready for use in the case of an opioid overdose. Make sure that you have the phone number of your care team and local hospital ready. You may need to have additional doses of this medication. Each nasal spray contains a single dose. Some emergencies may require additional doses. After use, bring the treated person to the nearest hospital or call 911. Make sure the treating care team knows that the person has received a dose of this medication. You will receive additional instructions on what to do during and after use of this   medication before an emergency occurs.  What side effects may I notice from receiving this medication? Side effects that you should report to your care team as soon as possible: Allergic reactions--skin rash, itching, hives, swelling of the face, lips, tongue, or throat Side effects that usually do not require medical attention (report these to your care team if they continue or are bothersome): Constipation Dryness or irritation inside the nose Headache Increase in blood pressure Muscle spasms Stuffy nose Toothache This list may not describe all possible side effects. Call your doctor for medical advice about side effects. You may report side effects to FDA at 1-800-FDA-1088.  Where should I keep my medication? Because this is an emergency medication, you should keep it with you at all times.  Keep out of the reach of children and pets. Store between 20 and 25 degrees C (68 and 77 degrees F). Do not freeze. Throw away any unused medication after the expiration date. Keep in original box until ready to use.  NOTE: This sheet is a summary. It may not cover all possible information. If you have questions about this medicine, talk to your doctor, pharmacist, or health care provider.   2023 Elsevier/Gold Standard (2020-10-17  00:00:00)  ____________________________________________________________________________________________   

## 2022-06-15 NOTE — Progress Notes (Unsigned)
PROVIDER NOTE: Information contained herein reflects review and annotations entered in association with encounter. Interpretation of such information and data should be left to medically-trained personnel. Information provided to patient can be located elsewhere in the medical record under "Patient Instructions". Document created using STT-dictation technology, any transcriptional errors that may result from process are unintentional.    Patient: Debra Myers  Service Category: E/M  Provider: Oswaldo Done, MD  DOB: February 24, 1965  DOS: 06/16/2022  Referring Provider: Doreene Nest, NP  MRN: [id number]  Specialty: Interventional Pain Management  PCP: Doreene Nest, NP  Type: Established Patient  Setting: Ambulatory outpatient    Location: Office  Delivery: Face-to-face     HPI  Debra Myers, a 57 y.o. year old female, is here today because of her No primary diagnosis found.. Debra Myers's primary complain today is No chief complaint on file.  Pertinent problems: Debra Myers has Numbness of upper extremity; Radiculitis involving upper extremity; Chronic low back pain (1ry area of Pain) (Bilateral) (L>R); Lumbar spondylosis (Bulging Disc & Severe Left Foraminal Stenosis at L3-4); Chronic lower extremity pain (2ry area of Pain) (Left); Lumbar foraminal stenosis (Severe Left L3-4); Lumbar facet syndrome (Bilateral) (L>R); Cervical spondylosis (C5-6 & C6-7 DDD); Cervical (3 mm) Grade 1 Anterolisthesis of C4 over C5; Chronic neck pain (3ry area of Pain) (Bilateral) (L>R); Chronic cervical radicular pain (Bilateral) (L>R); Cervical foraminal stenosis (multilevel) (Bilateral); Muscle spasm, nocturnal; Musculoskeletal pain; Carpal tunnel syndrome (Bilateral) (L>R); Cervical facet syndrome (Bilateral) (L>R); Chronic pain syndrome; DDD (degenerative disc disease), lumbar; DDD (degenerative disc disease), cervical; Pain in right knee; Chronic pain of left knee; Osteoarthritis involving multiple joints;  Acute exacerbation of chronic low back pain; and Pain in left knee on their pertinent problem list. Pain Assessment: Severity of   is reported as a  /10. Location:    / . Onset:  . Quality:  . Timing:  . Modifying factor(s):  Marland Kitchen Vitals:  vitals were not taken for this visit.  BMI: Estimated body mass index is 41.45 kg/m as calculated from the following:   Height as of 04/26/22:  (1.6 m).   Weight as of 04/26/22: 234 lb (106.1 kg). Last encounter: 06/09/2022. Last procedure: Visit date not found.  Reason for encounter: medication management. ***  RTCB: 09/18/2022   Pharmacotherapy Assessment  Analgesic: Hydrocodone/APAP 5/325, 2 tab PO QD (10 mg/day of hydrocodone) MME/day: 10 mg/day.   Monitoring: [place] PMP: PDMP reviewed during this encounter.       Pharmacotherapy: No side-effects or adverse reactions reported. Compliance: No problems identified. Effectiveness: Clinically acceptable.  No notes on file  No results found for: "CBDTHCR" No results found for: "D8THCCBX" No results found for: "D9THCCBX"  UDS:  Summary  Date Value Ref Range Status  08/05/2021 Note  Final    Comment:    ==================================================================== [employer] (MW) ==================================================================== Test                             Result       Flag       Units  Drug Present and Declared for Prescription Verification   Amphetamine                    948          EXPECTED   ng/mg creat    Amphetamine is available as a schedule II prescription drug.    Lorazepam  556          EXPECTED   ng/mg creat    Source of lorazepam is a scheduled prescription medication.    Hydrocodone                    667          EXPECTED   ng/mg creat   Dihydrocodeine                 102          EXPECTED   ng/mg creat   Norhydrocodone                 1229         EXPECTED   ng/mg creat    Sources of hydrocodone include scheduled  prescription medications.    Dihydrocodeine and norhydrocodone are expected metabolites of    hydrocodone. Dihydrocodeine is also available as a scheduled    prescription medication.  ==================================================================== Test                      Result    Flag   Units      Ref Range   Creatinine              95               mg/dL      >=44 ==================================================================== Declared Medications:  The flagging and interpretation on this report are based on the  following declared medications.  Unexpected results may arise from  inaccuracies in the declared medications.   **Note: The testing scope of this panel includes these medications:   Amphetamine (Vyvanse)  Hydrocodone (Norco)  Lorazepam (Ativan)   **Note: The testing scope of this panel does not include the  following reported medications:   Acetaminophen (Norco)  Albuterol  Aspirin  Atorvastatin  Bupropion (Wellbutrin XL)  Cetirizine (Zyrtec)  Fluticasone (Flonase)  Ibuprofen (Advil)  Lamotrigine (Lamictal)  Metaxalone (Skelaxin)  Metformin  Montelukast  Paroxetine (Paxil)  Spironolactone ==================================================================== For clinical consultation, please call [phone]. ====================================================================       ROS  Constitutional: Denies any fever or chills Gastrointestinal: No reported hemesis, hematochezia, vomiting, or acute GI distress Musculoskeletal: Denies any acute onset joint swelling, redness, loss of ROM, or weakness Neurological: No reported episodes of acute onset apraxia, aphasia, dysarthria, agnosia, amnesia, paralysis, loss of coordination, or loss of consciousness  Medication Review  Calcium-Magnesium-Vitamin D, HYDROcodone-acetaminophen, LORazepam, PARoxetine, albuterol, amphetamine-dextroamphetamine, aspirin EC, atorvastatin, buPROPion, cetirizine,  famotidine, fluticasone, hydrocortisone cream, ibuprofen, lamoTRIgine, metFORMIN, metaxalone, montelukast, naloxone, and spironolactone  History Review  Allergy: Debra Myers is allergic to oxycodone, penicillins, red dye, and sulfa antibiotics. Drug: Debra Myers  reports no history of drug use. Alcohol:  reports no history of alcohol use. Tobacco:  reports that she has never smoked. She has never used smokeless tobacco. Social: Debra Myers  reports that she has never smoked. She has never used smokeless tobacco. She reports that she does not drink alcohol and does not use drugs. Medical:  has a past medical history of Bulging lumbar disc (L3-4) (02/26/2015), Chronic pain syndrome, DDD (degenerative disc disease), cervical, DDD (degenerative disc disease), lumbar, GAD (generalized anxiety disorder), GERD (gastroesophageal reflux disease), History of transient ischemic attack (TIA) (06/11/2015), Hypertension, MDD (major depressive disorder), Mild intermittent asthma, PCOS (polycystic ovarian syndrome), Pre-diabetes, Retained intrauterine contraceptive device (IUD), Seasonal allergic rhinitis, and Wears contact lenses. Surgical: Debra Myers  has a past surgical  history that includes Cholecystectomy, laparoscopic (11/22/1999); Wisdom tooth extraction; Dilatation & curettage/hysteroscopy with myosure (N/A, 09/10/2014); Laparoscopic gastric banding (04/2012); Hysteroscopy (N/A, 02/11/2022); and IUD removal (N/A, 02/11/2022). Family: family history includes Asthma in her maternal grandmother; Cancer in her mother; Diabetes in her father; Hypertension in her father and mother; Stroke in her maternal grandfather and maternal grandmother.  Laboratory Chemistry Profile   Renal Lab Results  Component Value Date   BUN 19 02/11/2022   CREATININE 0.90 02/11/2022   BCR NOT APPLICABLE 11/23/2019   GFR 70.13 12/29/2021   GFRAA >60 12/16/2017   GFRNONAA >60 02/11/2022    Hepatic Lab Results  Component Value Date    AST 18 12/29/2021   ALT 18 12/29/2021   ALBUMIN 4.4 12/29/2021   ALKPHOS 110 12/29/2021   HCVAB NEGATIVE 05/18/2013    Electrolytes Lab Results  Component Value Date   NA 137 02/11/2022   K 4.4 02/11/2022   CL 105 02/11/2022   CALCIUM 9.2 02/11/2022   PHOS 4.0 10/31/2015    Bone Lab Results  Component Value Date   VD25OH 27.38 (L) 12/29/2021    Inflammation (CRP: Acute Phase) (ESR: Chronic Phase) Lab Results  Component Value Date   ESRSEDRATE 32 05/26/2017         Note: Above Lab results reviewed.  Recent Imaging Review  US Transvaginal Non-OB Indication:  retained IUD, possible recent expulsion of IUD.   Findings: Pelvic US  Uterus 5.82 x 4.49 x 2.59 cm.  IUD in endometrial canal.  Left ovary 2.36 x 1.4 cm.  Right ovary 2.51 x 1.58 cm.  No adnexal masses.  No free fluid.   Impression:  IUD in normal position in the endometrial canal. Note: Reviewed        Physical Exam  General appearance: Well nourished, well developed, and well hydrated. In no apparent acute distress Mental status: Alert, oriented x 3 (person, place, & time)       Respiratory: No evidence of acute respiratory distress Eyes: PERLA Vitals: There were no vitals taken for this visit. BMI: Estimated body mass index is 41.45 kg/m as calculated from the following:   Height as of 04/26/22: 5\' 3"  (1.6 m).   Weight as of 04/26/22: 234 lb (106.1 kg). Ideal: Patient weight not recorded  Assessment   Diagnosis Status  1. Chronic pain syndrome   2. Pharmacologic therapy   3. Musculoskeletal pain   4. Chronic neck pain (3ry area of Pain) (Bilateral) (L>R)   5. Cervical facet syndrome (Bilateral) (L>R)   6. Lumbar facet syndrome (Bilateral) (L>R)   7. Encounter for medication management   8. Encounter for chronic pain management   9. Chronic lower extremity pain (2ry area of Pain) (Left)   10. Chronic cervical radicular pain (Bilateral) (L>R)   11. Chronic use of opiate for therapeutic purpose    12. Chronic low back pain (1ry area of Pain) (Bilateral) (L>R)    Controlled Controlled Controlled   Updated Problems: No problems updated.  Plan of Care  Problem-specific:  No problem-specific Assessment & Plan notes found for this encounter.  Debra Myers has a current medication list which includes the following long-term medication(s): albuterol, amphetamine-dextroamphetamine, atorvastatin, calcium-magnesium-vitamin d, famotidine, fluticasone, hydrocodone-acetaminophen, metformin, montelukast, and spironolactone.  Pharmacotherapy (Medications Ordered): No orders of the defined types were placed in this encounter.  Orders:  No orders of the defined types were placed in this encounter.  Follow-up plan:   No follow-ups on file.  Interventional Therapies  Risk Factors  Considerations:     Planned  Pending:      Under consideration:   Diagnostic bilateral lumbar facet block  Possible bilateral lumbar facet RFA.  Diagnostic left L3-4 LESI  Diagnostic left L3 TFESI  Diagnostic left L4 TFESI  Diagnostic bilateral L5 TFESI  Diagnostic left CESI  Diagnostic bilateral cervical facet block  Possible bilateral cervical facet RFA.    Completed:   None at this time   Completed by other providers:   None at this time   Therapeutic  Palliative (PRN) options:   None established   Pharmacotherapy  Nonopioids transferred to 12/24/2019: Skelaxin       Recent Visits No visits were found meeting these conditions. Showing recent visits within past 90 days and meeting all other requirements Future Appointments Date Type Provider Dept  06/16/22 Appointment Delano Metz, MD [facility]  Showing future appointments within next 90 days and meeting all other requirements  I discussed the assessment and treatment plan with the patient. The patient was provided an opportunity to ask questions and all were answered. The patient agreed with the plan and  demonstrated an understanding of the instructions.  Patient advised to call back or seek an in-person evaluation if the symptoms or condition worsens.  Duration of encounter: *** minutes.  Total time on encounter, as per AMA guidelines included both the face-to-face and non-face-to-face time personally spent by the physician and/or other qualified health care professional(s) on the day of the encounter (includes time in activities that require the physician or other qualified health care professional and does not include time in activities normally performed by clinical staff). Physician's time may include the following activities when performed: Preparing to see the patient (e.g., pre-charting review of records, searching for previously ordered imaging, lab work, and nerve conduction tests) Review of prior analgesic pharmacotherapies. Reviewing PMP Interpreting ordered tests (e.g., lab work, imaging, nerve conduction tests) Performing post-procedure evaluations, including interpretation of diagnostic procedures Obtaining and/or reviewing separately obtained history Performing a medically appropriate examination and/or evaluation Counseling and educating the patient/family/caregiver Ordering medications, tests, or procedures Referring and communicating with other health care professionals (when not separately reported) Documenting clinical information in the electronic or other health record Independently interpreting results (not separately reported) and communicating results to the patient/ family/caregiver Care coordination (not separately reported)  Note by: Oswaldo Done, MD Date: 06/16/2022; Time: 8:03 AM

## 2022-06-16 ENCOUNTER — Ambulatory Visit: Payer: Managed Care, Other (non HMO) | Attending: Pain Medicine | Admitting: Pain Medicine

## 2022-06-16 ENCOUNTER — Encounter: Payer: Self-pay | Admitting: Pain Medicine

## 2022-06-16 DIAGNOSIS — G894 Chronic pain syndrome: Secondary | ICD-10-CM | POA: Diagnosis present

## 2022-06-16 DIAGNOSIS — M542 Cervicalgia: Secondary | ICD-10-CM | POA: Insufficient documentation

## 2022-06-16 DIAGNOSIS — Z79891 Long term (current) use of opiate analgesic: Secondary | ICD-10-CM | POA: Diagnosis present

## 2022-06-16 DIAGNOSIS — M47812 Spondylosis without myelopathy or radiculopathy, cervical region: Secondary | ICD-10-CM | POA: Diagnosis present

## 2022-06-16 DIAGNOSIS — Z79899 Other long term (current) drug therapy: Secondary | ICD-10-CM | POA: Diagnosis present

## 2022-06-16 DIAGNOSIS — M5412 Radiculopathy, cervical region: Secondary | ICD-10-CM | POA: Diagnosis present

## 2022-06-16 DIAGNOSIS — G8929 Other chronic pain: Secondary | ICD-10-CM | POA: Diagnosis present

## 2022-06-16 DIAGNOSIS — M7918 Myalgia, other site: Secondary | ICD-10-CM | POA: Diagnosis present

## 2022-06-16 DIAGNOSIS — M79605 Pain in left leg: Secondary | ICD-10-CM | POA: Diagnosis present

## 2022-06-16 DIAGNOSIS — M47816 Spondylosis without myelopathy or radiculopathy, lumbar region: Secondary | ICD-10-CM | POA: Insufficient documentation

## 2022-06-16 DIAGNOSIS — M545 Low back pain, unspecified: Secondary | ICD-10-CM | POA: Insufficient documentation

## 2022-06-16 MED ORDER — HYDROCODONE-ACETAMINOPHEN 5-325 MG PO TABS: 0.5000 | ORAL_TABLET | Freq: Two times a day (BID) | ORAL | 0 refills | Status: DC | PRN

## 2022-06-16 NOTE — Progress Notes (Signed)
Nursing Pain Medication Assessment:  Safety precautions to be maintained throughout the outpatient stay will include: orient to surroundings, keep bed in low position, maintain call bell within reach at all times, provide assistance with transfer out of bed and ambulation.  Medication Inspection Compliance: Pill count conducted under aseptic conditions, in front of the patient. Neither the pills nor the bottle was removed from the patient's sight at any time. Once count was completed pills were immediately returned to the patient in their original bottle.  Medication: Hydrocodone/APAP Pill/Patch Count:  10.5 of 60 pills remain Pill/Patch Appearance: Markings consistent with prescribed medication Bottle Appearance: Standard pharmacy container. Clearly labeled. Filled Date: 03 / 25 / 2024 Last Medication intake:  Today

## 2022-07-07 ENCOUNTER — Encounter: Payer: Self-pay | Admitting: Obstetrics and Gynecology

## 2022-08-05 DIAGNOSIS — G894 Chronic pain syndrome: Secondary | ICD-10-CM

## 2022-08-05 DIAGNOSIS — M7918 Myalgia, other site: Secondary | ICD-10-CM

## 2022-08-07 MED ORDER — IBUPROFEN 800 MG PO TABS: 800.0000 mg | ORAL_TABLET | Freq: Two times a day (BID) | ORAL | 0 refills | Status: DC | PRN

## 2022-09-05 NOTE — Patient Instructions (Signed)
____________________________________________________________________________________________  Opioid Pain Medication Update  To: All patients taking opioid pain medications. (I.e.: hydrocodone, hydromorphone, oxycodone, oxymorphone, morphine, codeine, methadone, tapentadol, tramadol, buprenorphine, fentanyl, etc.)  Re: Updated review of side effects and adverse reactions of opioid analgesics, as well as new information about long term effects of this class of medications.  Direct risks of long-term opioid therapy are not limited to opioid addiction and overdose. Potential medical risks include serious fractures, breathing problems during sleep, hyperalgesia, immunosuppression, chronic constipation, bowel obstruction, myocardial infarction, and tooth decay secondary to xerostomia.  Unpredictable adverse effects that can occur even if you take your medication correctly: Cognitive impairment, respiratory depression, and death. Most people think that if they take their medication "correctly", and "as instructed", that they will be safe. Nothing could be farther from the truth. In reality, a significant amount of recorded deaths associated with the use of opioids has occurred in individuals that had taken the medication for a long time, and were taking their medication correctly. The following are examples of how this can happen: Patient taking his/her medication for a long time, as instructed, without any side effects, is given a certain antibiotic or another unrelated medication, which in turn triggers a "Drug-to-drug interaction" leading to disorientation, cognitive impairment, impaired reflexes, respiratory depression or an untoward event leading to serious bodily harm or injury, including death.  Patient taking his/her medication for a long time, as instructed, without any side effects, develops an acute impairment of liver and/or kidney function. This will lead to a rapid inability of the body to  breakdown and eliminate their pain medication, which will result in effects similar to an "overdose", but with the same medicine and dose that they had always taken. This again may lead to disorientation, cognitive impairment, impaired reflexes, respiratory depression or an untoward event leading to serious bodily harm or injury, including death.  A similar problem will occur with patients as they grow older and their liver and kidney function begins to decrease as part of the aging process.  Background information: Historically, the original case for using long-term opioid therapy to treat chronic noncancer pain was based on safety assumptions that subsequent experience has called into question. In 1996, the American Pain Society and the American Academy of Pain Medicine issued a consensus statement supporting long-term opioid therapy. This statement acknowledged the dangers of opioid prescribing but concluded that the risk for addiction was low; respiratory depression induced by opioids was short-lived, occurred mainly in opioid-naive patients, and was antagonized by pain; tolerance was not a common problem; and efforts to control diversion should not constrain opioid prescribing. This has now proven to be wrong. Experience regarding the risks for opioid addiction, misuse, and overdose in community practice has failed to support these assumptions.  According to the Centers for Disease Control and Prevention, fatal overdoses involving opioid analgesics have increased sharply over the past decade. Currently, more than 96,700 people die from drug overdoses every year. Opioids are a factor in 7 out of every 10 overdose deaths. Deaths from drug overdose have surpassed motor vehicle accidents as the leading cause of death for individuals between the ages of 35 and 54.  Clinical data suggest that neuroendocrine dysfunction may be very common in both men and women, potentially causing hypogonadism, erectile  dysfunction, infertility, decreased libido, osteoporosis, and depression. Recent studies linked higher opioid dose to increased opioid-related mortality. Controlled observational studies reported that long-term opioid therapy may be associated with increased risk for cardiovascular events. Subsequent meta-analysis concluded   that the safety of long-term opioid therapy in elderly patients has not been proven.   Side Effects and adverse reactions: Common side effects: Drowsiness (sedation). Dizziness. Nausea and vomiting. Constipation. Physical dependence -- Dependence often manifests with withdrawal symptoms when opioids are discontinued or decreased. Tolerance -- As you take repeated doses of opioids, you require increased medication to experience the same effect of pain relief. Respiratory depression -- This can occur in healthy people, especially with higher doses. However, people with COPD, asthma or other lung conditions may be even more susceptible to fatal respiratory impairment.  Uncommon side effects: An increased sensitivity to feeling pain and extreme response to pain (hyperalgesia). Chronic use of opioids can lead to this. Delayed gastric emptying (the process by which the contents of your stomach are moved into your small intestine). Muscle rigidity. Immune system and hormonal dysfunction. Quick, involuntary muscle jerks (myoclonus). Arrhythmia. Itchy skin (pruritus). Dry mouth (xerostomia).  Long-term side effects: Chronic constipation. Sleep-disordered breathing (SDB). Increased risk of bone fractures. Hypothalamic-pituitary-adrenal dysregulation. Increased risk of overdose.  RISKS: Respiratory depression and death: Opioids increase the risk of respiratory depression and death.  Drug-to-drug interactions: Opioids are relatively contraindicated in combination with benzodiazepines, sleep inducers, and other central nervous system depressants. Other classes of medications  (i.e.: certain antibiotics and even over-the-counter medications) may also trigger or induce respiratory depression in some patients.  Medical conditions: Patients with pre-existing respiratory problems are at higher risk of respiratory failure and/or depression when in combination with opioid analgesics. Opioids are relatively contraindicated in some medical conditions such as central sleep apnea.   Fractures and Falls:  Opioids increase the risk and incidence of falls. This is of particular importance in elderly patients.  Endocrine System:  Long-term administration is associated with endocrine abnormalities (endocrinopathies). (Also known as Opioid-induced Endocrinopathy) Influences on both the hypothalamic-pituitary-adrenal axis?and the hypothalamic-pituitary-gonadal axis have been demonstrated with consequent hypogonadism and adrenal insufficiency in both sexes. Hypogonadism and decreased levels of dehydroepiandrosterone sulfate have been reported in men and women. Endocrine effects include: Amenorrhoea in women (abnormal absence of menstruation) Reduced libido in both sexes Decreased sexual function Erectile dysfunction in men Hypogonadisms (decreased testicular function with shrinkage of testicles) Infertility Depression and fatigue Loss of muscle mass Anxiety Depression Immune suppression Hyperalgesia Weight gain Anemia Osteoporosis Patients (particularly women of childbearing age) should avoid opioids. There is insufficient evidence to recommend routine monitoring of asymptomatic patients taking opioids in the long-term for hormonal deficiencies.  Immune System: Human studies have demonstrated that opioids have an immunomodulating effect. These effects are mediated via opioid receptors both on immune effector cells and in the central nervous system. Opioids have been demonstrated to have adverse effects on antimicrobial response and anti-tumour surveillance. Buprenorphine has  been demonstrated to have no impact on immune function.  Opioid Induced Hyperalgesia: Human studies have demonstrated that prolonged use of opioids can lead to a state of abnormal pain sensitivity, sometimes called opioid induced hyperalgesia (OIH). Opioid induced hyperalgesia is not usually seen in the absence of tolerance to opioid analgesia. Clinically, hyperalgesia may be diagnosed if the patient on long-term opioid therapy presents with increased pain. This might be qualitatively and anatomically distinct from pain related to disease progression or to breakthrough pain resulting from development of opioid tolerance. Pain associated with hyperalgesia tends to be more diffuse than the pre-existing pain and less defined in quality. Management of opioid induced hyperalgesia requires opioid dose reduction.  Cancer: Chronic opioid therapy has been associated with an increased risk of cancer   among noncancer patients with chronic pain. This association was more evident in chronic strong opioid users. Chronic opioid consumption causes significant pathological changes in the small intestine and colon. Epidemiological studies have found that there is a link between opium dependence and initiation of gastrointestinal cancers. Cancer is the second leading cause of death after cardiovascular disease. Chronic use of opioids can cause multiple conditions such as GERD, immunosuppression and renal damage as well as carcinogenic effects, which are associated with the incidence of cancers.   Mortality: Long-term opioid use has been associated with increased mortality among patients with chronic non-cancer pain (CNCP).  Prescription of long-acting opioids for chronic noncancer pain was associated with a significantly increased risk of all-cause mortality, including deaths from causes other than overdose.  Reference: Von Korff M, Kolodny A, Deyo RA, Chou R. Long-term opioid therapy reconsidered. Ann Intern Med. 2011  Sep 6;155(5):325-8. doi: 10.7326/[id number]. PMID: 21893626; PMCID: PMC3280085. Bedson J, Chen Y, Ashworth J, Hayward RA, Dunn KM, Jordan KP. Risk of adverse events in patients prescribed long-term opioids: A cohort study in the [place] Clinical Practice Research Datalink. Eur J Pain. 2019 May;23(5):908-922. doi: 10.1002/ejp.1357. Epub 2019 Jan 31. PMID: 30620116. Colameco S, Coren JS, Ciervo CA. Continuous opioid treatment for chronic noncancer pain: a time for moderation in prescribing. Postgrad Med. 2009 Jul;121(4):61-6. doi: 10.3810/pgm.2009.07.2032. PMID: 19641271. Chou R, Turner JA, Devine EB, Hansen RN, Sullivan SD, Blazina I, Dana T, Bougatsos C, Deyo RA. The effectiveness and risks of long-term opioid therapy for chronic pain: a systematic review for a National Institutes of Health Pathways to Prevention Workshop. Ann Intern Med. 2015 Feb 17;162(4):276-86. doi: 10.7326/M14-2559. PMID: 25581257. Warner M, Chen LH, Makuc DM. NCHS Data Brief No. 22. [place]: Centers for Disease Control and Prevention; 2009. Sep, Increase in Fatal Poisonings Involving Opioid Analgesics in the [place], 1999-2006. Song IA, Choi HR, Oh TK. Long-term opioid use and mortality in patients with chronic non-cancer pain: Ten-year follow-up study in [name] from 2010 through 2019. EClinicalMedicine. 2022 Jul 18;51:101558. doi: 10.1016/j.eclinm.2022.101558. PMID: 35875817; PMCID: PMC9304910. Huser, W., Schubert, T., Vogelmann, T. et al. All-cause mortality in patients with long-term opioid therapy compared with non-opioid analgesics for chronic non-cancer pain: a database study. BMC Med 18, 162 (2020). https://doi.org/10.1186/[email or web address] Rashidian H, Zendehdel K, Kamangar F, Malekzadeh R, Haghdoost AA. An Ecological Study of the Association between Opiate Use and Incidence of Cancers. Addict Health. 2016 Fall;8(4):252-260. PMID: 28819556; PMCID: PMC5554805.  Our Goal: Our goal is to control your  pain with means other than the use of opioid pain medications.  Our Recommendation: Talk to your physician about coming off of these medications. We can assist you with the tapering down and stopping these medicines. Based on the new information, even if you cannot completely stop the medication, a decrease in the dose may be associated with a lesser risk. Ask for other means of controlling the pain. Decrease or eliminate those factors that significantly contribute to your pain such as smoking, obesity, and a diet heavily tilted towards "inflammatory" nutrients.  Last Updated: 08/30/2022   ____________________________________________________________________________________________     ____________________________________________________________________________________________  Transfer of Pain Medication between Pharmacies  Re: 2023 DEA Clarification on existing regulation  Published on DEA Website: October 23, 2021  Title: Revised Regulation Allows DEA-Registered Pharmacies to Transfer Electronic Prescriptions at a Patient's Request DEA Headquarters Division - Public Information Office  "Patients now have the ability to request their electronic prescription be transferred to another pharmacy without having to go back to their practitioner to initiate the   request. This revised regulation went into effect on Monday, October 19, 2021.     At a patient's request, a DEA-registered retail pharmacy can now transfer an electronic prescription for a controlled substance (schedules II-V) to another DEA-registered retail pharmacy. Prior to this change, patients would have to go through their practitioner to cancel their prescription and have it re-issued to a different pharmacy. The process was taxing and time consuming for both patients and practitioners.    The Drug Enforcement Administration (DEA) published its intent to revise the process for transferring electronic prescriptions on January 11, 2020.  The final rule was published in the federal register on September 17, 2021 and went into effect 30 days later.  Under the final rule, a prescription can only be transferred once between pharmacies, and only if allowed under existing state or other applicable law. The prescription must remain in its electronic form; may not be altered in any way; and the transfer must be communicated directly between two licensed pharmacists. It's important to note, any authorized refills transfer with the original prescription, which means the entire prescription will be filled at the same pharmacy."    REFERENCES: 1. DEA website announcement https://www.dea.gov/stories/2023/2023-10/2021-09-01/revised-regulation-allows-dea-registered-pharmacies-transfer  2. Department of Justice website  https://www.govinfo.gov/content/pkg/FR-2021-09-17/pdf/2023-15847.pdf  3. DEPARTMENT OF JUSTICE Drug Enforcement Administration 21 CFR Part 1306 [Docket No. DEA-637] RIN [id number] "Transfer of Electronic Prescriptions for Schedules II-V Controlled Substances Between Pharmacies for Initial Filling"  ____________________________________________________________________________________________     _______________________________________________________________________  Medication Rules  Purpose: To inform patients, and their family members, of our medication rules and regulations.  Applies to: All patients receiving prescriptions from our practice (written or electronic).  Pharmacy of record: This is the pharmacy where your electronic prescriptions will be sent. Make sure we have the correct one.  Electronic prescriptions: In compliance with the [place] Strengthen Opioid Misuse Prevention (STOP) Act of 2017 (Session Law 2017-74/H243), effective February 22, 2018, all controlled substances must be electronically prescribed. Written prescriptions, faxing, or calling prescriptions to a pharmacy will no longer be  done.  Prescription refills: These will be provided only during in-person appointments. No medications will be renewed without a "face-to-face" evaluation with your provider. Applies to all prescriptions.  NOTE: The following applies primarily to controlled substances (Opioid* Pain Medications).   Type of encounter (visit): For patients receiving controlled substances, face-to-face visits are required. (Not an option and not up to the patient.)  Patient's responsibilities: Pain Pills: Bring all pain pills to every appointment (except for procedure appointments). Pill Bottles: Bring pills in original pharmacy bottle. Bring bottle, even if empty. Always bring the bottle of the most recent fill.  Medication refills: You are responsible for knowing and keeping track of what medications you are taking and when is it that you will need a refill. The day before your appointment: write a list of all prescriptions that need to be refilled. The day of the appointment: give the list to the admitting nurse. Prescriptions will be written only during appointments. No prescriptions will be written on procedure days. If you forget a medication: it will not be "Called in", "Faxed", or "electronically sent". You will need to get another appointment to get these prescribed. No early refills. Do not call asking to have your prescription filled early. Partial  or short prescriptions: Occasionally your pharmacy may not have enough pills to fill your prescription.  NEVER ACCEPT a partial fill or a prescription that is short of the total amount of pills that you were prescribed.    With controlled substances the law allows 72 hours for the pharmacy to complete the prescription.  If the prescription is not completed within 72 hours, the pharmacist will require a new prescription to be written. This means that you will be short on your medicine and we WILL NOT send another prescription to complete your original prescription.   Instead, request the pharmacy to send a carrier to a nearby branch to get enough medication to provide you with your full prescription. Prescription Accuracy: You are responsible for carefully inspecting your prescriptions before leaving our office. Have the discharge nurse carefully go over each prescription with you, before taking them home. Make sure that your name is accurately spelled, that your address is correct. Check the name and dose of your medication to make sure it is accurate. Check the number of pills, and the written instructions to make sure they are clear and accurate. Make sure that you are given enough medication to last until your next medication refill appointment. Taking Medication: Take medication as prescribed. When it comes to controlled substances, taking less pills or less frequently than prescribed is permitted and encouraged. Never take more pills than instructed. Never take the medication more frequently than prescribed.  Inform other Doctors: Always inform, all of your healthcare providers, of all the medications you take. Pain Medication from other Providers: You are not allowed to accept any additional pain medication from any other Doctor or Healthcare provider. There are two exceptions to this rule. (see below) In the event that you require additional pain medication, you are responsible for notifying us, as stated below. Cough Medicine: Often these contain an opioid, such as codeine or hydrocodone. Never accept or take cough medicine containing these opioids if you are already taking an opioid* medication. The combination may cause respiratory failure and death. Medication Agreement: You are responsible for carefully reading and following our Medication Agreement. This must be signed before receiving any prescriptions from our practice. Safely store a copy of your signed Agreement. Violations to the Agreement will result in no further prescriptions. (Additional copies of  our Medication Agreement are available upon request.) Laws, Rules, & Regulations: All patients are expected to follow all Federal and State Laws, Statutes, Rules, & Regulations. Ignorance of the Laws does not constitute a valid excuse.  Illegal drugs and Controlled Substances: The use of illegal substances (including, but not limited to marijuana and its derivatives) and/or the illegal use of any controlled substances is strictly prohibited. Violation of this rule may result in the immediate and permanent discontinuation of any and all prescriptions being written by our practice. The use of any illegal substances is prohibited. Adopted CDC guidelines & recommendations: Target dosing levels will be at or below 60 MME/day. Use of benzodiazepines** is not recommended.  Exceptions: There are only two exceptions to the rule of not receiving pain medications from other Healthcare Providers. Exception #1 (Emergencies): In the event of an emergency (i.e.: accident requiring emergency care), you are allowed to receive additional pain medication. However, you are responsible for: As soon as you are able, call our office [phone], at any time of the day or night, and leave a message stating your name, the date and nature of the emergency, and the name and dose of the medication prescribed. In the event that your call is answered by a member of our staff, make sure to document and save the date, time, and the name of the person that took your information.  Exception #2 (  Planned Surgery): In the event that you are scheduled by another doctor or dentist to have any type of surgery or procedure, you are allowed (for a period no longer than 30 days), to receive additional pain medication, for the acute post-op pain. However, in this case, you are responsible for picking up a copy of our "Post-op Pain Management for Surgeons" handout, and giving it to your surgeon or dentist. This document is available at our office,  and does not require an appointment to obtain it. Simply go to our office during business hours (Monday-Thursday from 8:00 AM to 4:00 PM) (Friday 8:00 AM to 12:00 Noon) or if you have a scheduled appointment with us, prior to your surgery, and ask for it by name. In addition, you are responsible for: calling our office [phone], at any time of the day or night, and leaving a message stating your name, name of your surgeon, type of surgery, and date of procedure or surgery. Failure to comply with your responsibilities may result in termination of therapy involving the controlled substances. Medication Agreement Violation. Following the above rules, including your responsibilities will help you in avoiding a Medication Agreement Violation ("Breaking your Pain Medication Contract").  Consequences:  Not following the above rules may result in permanent discontinuation of medication prescription therapy.  *Opioid medications include: morphine, codeine, oxycodone, oxymorphone, hydrocodone, hydromorphone, meperidine, tramadol, tapentadol, buprenorphine, fentanyl, methadone. **Benzodiazepine medications include: diazepam (Valium), alprazolam (Xanax), clonazepam (Klonopine), lorazepam (Ativan), clorazepate (Tranxene), chlordiazepoxide (Librium), estazolam (Prosom), oxazepam (Serax), temazepam (Restoril), triazolam (Halcion) (Last updated: 12/15/2021) ______________________________________________________________________    ______________________________________________________________________  Medication Recommendations and Reminders  Applies to: All patients receiving prescriptions (written and/or electronic).  Medication Rules & Regulations: You are responsible for reading, knowing, and following our "Medication Rules" document. These exist for your safety and that of others. They are not flexible and neither are we. Dismissing or ignoring them is an act of "non-compliance" that may result in  complete and irreversible termination of such medication therapy. For safety reasons, "non-compliance" will not be tolerated. As with the [place] fundamental legal principle of "ignorance of the law is no defense", we will accept no excuses for not having read and knowing the content of documents provided to you by our practice.  Pharmacy of record:  Definition: This is the pharmacy where your electronic prescriptions will be sent.  We do not endorse any particular pharmacy. It is up to you and your insurance to decide what pharmacy to use.  We do not restrict you in your choice of pharmacy. However, once we write for your prescriptions, we will NOT be re-sending more prescriptions to fix restricted supply problems created by your pharmacy, or your insurance.  The pharmacy listed in the electronic medical record should be the one where you want electronic prescriptions to be sent. If you choose to change pharmacy, simply notify our nursing staff. Changes will be made only during your regular appointments and not over the phone.  Recommendations: Keep all of your pain medications in a safe place, under lock and key, even if you live alone. We will NOT replace lost, stolen, or damaged medication. We do not accept "Police Reports" as proof of medications having been stolen. After you fill your prescription, take 1 week's worth of pills and put them away in a safe place. You should keep a separate, properly labeled bottle for this purpose. The remainder should be kept in the original bottle. Use this as your primary supply, until it runs out.   Once it's gone, then you know that you have 1 week's worth of medicine, and it is time to come in for a prescription refill. If you do this correctly, it is unlikely that you will ever run out of medicine. To make sure that the above recommendation works, it is very important that you make sure your medication refill appointments are scheduled at least 1 week before you  run out of medicine. To do this in an effective manner, make sure that you do not leave the office without scheduling your next medication management appointment. Always ask the nursing staff to show you in your prescription , when your medication will be running out. Then arrange for the receptionist to get you a return appointment, at least 7 days before you run out of medicine. Do not wait until you have 1 or 2 pills left, to come in. This is very poor planning and does not take into consideration that we may need to cancel appointments due to bad weather, sickness, or emergencies affecting our staff. DO NOT ACCEPT A "Partial Fill": If for any reason your pharmacy does not have enough pills/tablets to completely fill or refill your prescription, do not allow for a "partial fill". The law allows the pharmacy to complete that prescription within 72 hours, without requiring a new prescription. If they do not fill the rest of your prescription within those 72 hours, you will need a separate prescription to fill the remaining amount, which we will NOT provide. If the reason for the partial fill is your insurance, you will need to talk to the pharmacist about payment alternatives for the remaining tablets, but again, DO NOT ACCEPT A PARTIAL FILL, unless you can trust your pharmacist to obtain the remainder of the pills within 72 hours.  Prescription refills and/or changes in medication(s):  Prescription refills, and/or changes in dose or medication, will be conducted only during scheduled medication management appointments. (Applies to both, written and electronic prescriptions.) No refills on procedure days. No medication will be changed or started on procedure days. No changes, adjustments, and/or refills will be conducted on a procedure day. Doing so will interfere with the diagnostic portion of the procedure. No phone refills. No medications will be "called into the pharmacy". No Fax refills. No weekend  refills. No Holliday refills. No after hours refills.  Remember:  Business hours are:  Monday to Thursday 8:00 AM to 4:00 PM Provider's Schedule: Emillie Chasen, MD - Appointments are:  Medication management: Monday and Wednesday 8:00 AM to 4:00 PM Procedure day: Tuesday and Thursday 7:30 AM to 4:00 PM Bilal Lateef, MD - Appointments are:  Medication management: Tuesday and Thursday 8:00 AM to 4:00 PM Procedure day: Monday and Wednesday 7:30 AM to 4:00 PM (Last update: 12/15/2021) ______________________________________________________________________   ____________________________________________________________________________________________  Naloxone Nasal Spray  Why am I receiving this medication? [place] STOP ACT requires that all patients taking high dose opioids or at risk of opioids respiratory depression, be prescribed an opioid reversal agent, such as Naloxone (AKA: Narcan).  What is this medication? NALOXONE (nal OX one) treats opioid overdose, which causes slow or shallow breathing, severe drowsiness, or trouble staying awake. Call emergency services after using this medication. You may need additional treatment. Naloxone works by reversing the effects of opioids. It belongs to a group of medications called opioid blockers.  COMMON BRAND NAME(S): Kloxxado, Narcan  What should I tell my care team before I take this medication? They need to know if you have   any of these conditions: Heart disease Substance use disorder An unusual or allergic reaction to naloxone, other medications, foods, dyes, or preservatives Pregnant or trying to get pregnant Breast-feeding  When to use this medication? This medication is to be used for the treatment of respiratory depression (less than 8 breaths per minute) secondary to opioid overdose.   How to use this medication? This medication is for use in the nose. Lay the person on their back. Support their neck with your hand  and allow the head to tilt back before giving the medication. The nasal spray should be given into 1 nostril. After giving the medication, move the person onto their side. Do not remove or test the nasal spray until ready to use. Get emergency medical help right away after giving the first dose of this medication, even if the person wakes up. You should be familiar with how to recognize the signs and symptoms of a narcotic overdose. If more doses are needed, give the additional dose in the other nostril. Talk to your care team about the use of this medication in children. While this medication may be prescribed for children as young as newborns for selected conditions, precautions do apply.  Naloxone Overdosage: If you think you have taken too much of this medicine contact a poison control center or emergency room at once.  NOTE: This medicine is only for you. Do not share this medicine with others.  What if I miss a dose? This does not apply.  What may interact with this medication? This is only used during an emergency. No interactions are expected during emergency use. This list may not describe all possible interactions. Give your health care provider a list of all the medicines, herbs, non-prescription drugs, or dietary supplements you use. Also tell them if you smoke, drink alcohol, or use illegal drugs. Some items may interact with your medicine.  What should I watch for while using this medication? Keep this medication ready for use in the case of an opioid overdose. Make sure that you have the phone number of your care team and local hospital ready. You may need to have additional doses of this medication. Each nasal spray contains a single dose. Some emergencies may require additional doses. After use, bring the treated person to the nearest hospital or call 911. Make sure the treating care team knows that the person has received a dose of this medication. You will receive additional  instructions on what to do during and after use of this medication before an emergency occurs.  What side effects may I notice from receiving this medication? Side effects that you should report to your care team as soon as possible: Allergic reactions--skin rash, itching, hives, swelling of the face, lips, tongue, or throat Side effects that usually do not require medical attention (report these to your care team if they continue or are bothersome): Constipation Dryness or irritation inside the nose Headache Increase in blood pressure Muscle spasms Stuffy nose Toothache This list may not describe all possible side effects. Call your doctor for medical advice about side effects. You may report side effects to FDA at 1-800-FDA-1088.  Where should I keep my medication? Because this is an emergency medication, you should keep it with you at all times.  Keep out of the reach of children and pets. Store between 20 and 25 degrees C (68 and 77 degrees F). Do not freeze. Throw away any unused medication after the expiration date. Keep in original box   until ready to use.  NOTE: This sheet is a summary. It may not cover all possible information. If you have questions about this medicine, talk to your doctor, pharmacist, or health care provider.   2023 Elsevier/Gold Standard (2020-10-17 00:00:00)  ____________________________________________________________________________________________   

## 2022-09-05 NOTE — Progress Notes (Signed)
(  09/06/2022) NO-SHOW to medication management encounter.

## 2022-09-06 ENCOUNTER — Ambulatory Visit (HOSPITAL_BASED_OUTPATIENT_CLINIC_OR_DEPARTMENT_OTHER): Payer: Managed Care, Other (non HMO) | Admitting: Pain Medicine

## 2022-09-06 DIAGNOSIS — M545 Low back pain, unspecified: Secondary | ICD-10-CM

## 2022-09-06 DIAGNOSIS — Z91199 Patient's noncompliance with other medical treatment and regimen due to unspecified reason: Secondary | ICD-10-CM

## 2022-09-06 DIAGNOSIS — M7918 Myalgia, other site: Secondary | ICD-10-CM

## 2022-09-06 DIAGNOSIS — G894 Chronic pain syndrome: Secondary | ICD-10-CM

## 2022-09-06 DIAGNOSIS — M47816 Spondylosis without myelopathy or radiculopathy, lumbar region: Secondary | ICD-10-CM

## 2022-09-06 DIAGNOSIS — Z79899 Other long term (current) drug therapy: Secondary | ICD-10-CM

## 2022-09-06 DIAGNOSIS — Z79891 Long term (current) use of opiate analgesic: Secondary | ICD-10-CM

## 2022-09-06 DIAGNOSIS — G8929 Other chronic pain: Secondary | ICD-10-CM

## 2022-09-06 DIAGNOSIS — M47812 Spondylosis without myelopathy or radiculopathy, cervical region: Secondary | ICD-10-CM

## 2022-09-14 NOTE — Progress Notes (Unsigned)
PROVIDER NOTE: Information contained herein reflects review and annotations entered in association with encounter. Interpretation of such information and data should be left to medically-trained personnel. Information provided to patient can be located elsewhere in the medical record under "Patient Instructions". Document created using STT-dictation technology, any transcriptional errors that may result from process are unintentional.    Patient: Debra Myers  Service Category: E/M  Provider: Oswaldo Done, MD  DOB: 30-Nov-1965  DOS: 09/15/2022  Referring Provider: Doreene Nest, NP  MRN: [id number]  Specialty: Interventional Pain Management  PCP: Debra Nest, NP  Type: Established Patient  Setting: Ambulatory outpatient    Location: Office  Delivery: Face-to-face     HPI  [name], a 57 y.o. year old female, is here today because of her No primary diagnosis found.. [name]'s primary complain today is No chief complaint on file.  Pertinent problems: [name] has Numbness of upper extremity; Radiculitis involving upper extremity; Chronic low back pain (1ry area of Pain) (Bilateral) (L>R); Lumbar spondylosis (Bulging Disc & Severe Left Foraminal Stenosis at L3-4); Chronic lower extremity pain (2ry area of Pain) (Left); Lumbar foraminal stenosis (Severe Left L3-4); Lumbar facet syndrome (Bilateral) (L>R); Cervical spondylosis (C5-6 & C6-7 DDD); Cervical (3 mm) Grade 1 Anterolisthesis of C4 over C5; Chronic neck pain (3ry area of Pain) (Bilateral) (L>R); Chronic cervical radicular pain (Bilateral) (L>R); Cervical foraminal stenosis (multilevel) (Bilateral); Muscle spasm, nocturnal; Musculoskeletal pain; Carpal tunnel syndrome (Bilateral) (L>R); Cervical facet syndrome (Bilateral) (L>R); Chronic pain syndrome; DDD (degenerative disc disease), lumbar; DDD (degenerative disc disease), cervical; Pain in right knee; Chronic pain of left knee; Osteoarthritis involving multiple joints;  Acute exacerbation of chronic low back pain; and Pain in left knee on their pertinent problem list. Pain Assessment: Severity of   is reported as a  /10. Location:    / . Onset:  . Quality:  . Timing:  . Modifying factor(s):  Marland Kitchen Vitals:  vitals were not taken for this visit.  BMI: Estimated body mass index is 40.34 kg/m as calculated from the following:   Height as of 06/16/22: 5\' 4"  (1.626 m).   Weight as of 06/16/22: 235 lb (106.6 kg). Last encounter: 09/06/2022. Last procedure: Visit date not found.  Reason for encounter: medication management. ***  Routine UDS ordered today.   RTCB:   Pharmacotherapy Assessment  Analgesic: Hydrocodone/APAP 5/325, 2 tab PO QD (10 mg/day of hydrocodone) MME/day: 10 mg/day.   Monitoring: Debra Myers PMP: PDMP reviewed during this encounter.       Pharmacotherapy: No side-effects or adverse reactions reported. Compliance: No problems identified. Effectiveness: Clinically acceptable.  No notes on file  No results found for: "CBDTHCR" No results found for: "D8THCCBX" No results found for: "D9THCCBX"  UDS:  Summary  Date Value Ref Range Status  08/05/2021 Note  Final    Comment:    ==================================================================== [facility] (MW) ==================================================================== Test                             Result       Flag       Units  Drug Present and Declared for Prescription Verification   Amphetamine                    948          EXPECTED   ng/mg creat    Amphetamine is available as a schedule II prescription drug.  Lorazepam                      556          EXPECTED   ng/mg creat    Source of lorazepam is a scheduled prescription medication.    Hydrocodone                    667          EXPECTED   ng/mg creat   Dihydrocodeine                 102          EXPECTED   ng/mg creat   Norhydrocodone                 1229         EXPECTED   ng/mg creat    Sources of hydrocodone  include scheduled prescription medications.    Dihydrocodeine and norhydrocodone are expected metabolites of    hydrocodone. Dihydrocodeine is also available as a scheduled    prescription medication.  ==================================================================== Test                      Result    Flag   Units      Ref Range   Creatinine              95               mg/dL      >=16 ==================================================================== Declared Medications:  The flagging and interpretation on this report are based on the  following declared medications.  Unexpected results may arise from  inaccuracies in the declared medications.   **Note: The testing scope of this panel includes these medications:   Amphetamine (Vyvanse)  Hydrocodone (Norco)  Lorazepam (Ativan)   **Note: The testing scope of this panel does not include the  following reported medications:   Acetaminophen (Norco)  Albuterol  Aspirin  Atorvastatin  Bupropion (Wellbutrin XL)  Cetirizine (Zyrtec)  Fluticasone (Flonase)  Ibuprofen (Advil)  Lamotrigine (Lamictal)  Metaxalone (Skelaxin)  Metformin  Montelukast  Paroxetine (Paxil)  Spironolactone ==================================================================== For clinical consultation, please call [phone]. ====================================================================       ROS  Constitutional: Denies any fever or chills Gastrointestinal: No reported hemesis, hematochezia, vomiting, or acute GI distress Musculoskeletal: Denies any acute onset joint swelling, redness, loss of ROM, or weakness Neurological: No reported episodes of acute onset apraxia, aphasia, dysarthria, agnosia, amnesia, paralysis, loss of coordination, or loss of consciousness  Medication Review  Calcium-Magnesium-Vitamin D, HYDROcodone-acetaminophen, LORazepam, PARoxetine, albuterol, amphetamine-dextroamphetamine, aspirin EC, atorvastatin,  buPROPion, cetirizine, famotidine, fluticasone, hydrocortisone cream, ibuprofen, lamoTRIgine, metFORMIN, metaxalone, montelukast, naloxone, and spironolactone  History Review  Allergy: [name] is allergic to oxycodone, penicillins, red dye, and sulfa antibiotics. Drug: Ms. Rambo  reports no history of drug use. Alcohol:  reports no history of alcohol use. Tobacco:  reports that she has never smoked. She has never used smokeless tobacco. Social: Ms. Ammar  reports that she has never smoked. She has never used smokeless tobacco. She reports that she does not drink alcohol and does not use drugs. Medical:  has a past medical history of Bulging lumbar disc (L3-4) (02/26/2015), Chronic pain syndrome, DDD (degenerative disc disease), cervical, DDD (degenerative disc disease), lumbar, GAD (generalized anxiety disorder), GERD (gastroesophageal reflux disease), History of transient ischemic attack (TIA) (06/11/2015), Hypertension, MDD (major depressive disorder), Mild intermittent asthma, PCOS (polycystic ovarian  syndrome), Pre-diabetes, Retained intrauterine contraceptive device (IUD), Seasonal allergic rhinitis, and Wears contact lenses. Surgical: Ms. Hack  has a past surgical history that includes Cholecystectomy, laparoscopic (11/22/1999); Wisdom tooth extraction; Dilatation & curettage/hysteroscopy with myosure (N/A, 09/10/2014); Laparoscopic gastric banding (04/2012); Hysteroscopy (N/A, 02/11/2022); and IUD removal (N/A, 02/11/2022). Family: family history includes Asthma in her maternal grandmother; Cancer in her mother; Diabetes in her father; Hypertension in her father and mother; Stroke in her maternal grandfather and maternal grandmother.  Laboratory Chemistry Profile   Renal Lab Results  Component Value Date   BUN 19 02/11/2022   CREATININE 0.90 02/11/2022   BCR NOT APPLICABLE 11/23/2019   GFR 70.13 12/29/2021   GFRAA >60 12/16/2017   GFRNONAA >60 02/11/2022    Hepatic Lab Results   Component Value Date   AST 18 12/29/2021   ALT 18 12/29/2021   ALBUMIN 4.4 12/29/2021   ALKPHOS 110 12/29/2021   HCVAB NEGATIVE 05/18/2013    Electrolytes Lab Results  Component Value Date   NA 137 02/11/2022   K 4.4 02/11/2022   CL 105 02/11/2022   CALCIUM 9.2 02/11/2022   PHOS 4.0 10/31/2015    Bone Lab Results  Component Value Date   VD25OH 27.38 (L) 12/29/2021    Inflammation (CRP: Acute Phase) (ESR: Chronic Phase) Lab Results  Component Value Date   ESRSEDRATE 32 05/26/2017         Note: Above Lab results reviewed.  Recent Imaging Review  US Transvaginal Non-OB Indication:  retained IUD, possible recent expulsion of IUD.   Findings: Pelvic US  Uterus 5.82 x 4.49 x 2.59 cm.  IUD in endometrial canal.  Left ovary 2.36 x 1.4 cm.  Right ovary 2.51 x 1.58 cm.  No adnexal masses.  No free fluid.   Impression:  IUD in normal position in the endometrial canal. Note: Reviewed        Physical Exam  General appearance: Well nourished, well developed, and well hydrated. In no apparent acute distress Mental status: Alert, oriented x 3 (person, place, & time)       Respiratory: No evidence of acute respiratory distress Eyes: PERLA Vitals: There were no vitals taken for this visit. BMI: Estimated body mass index is 40.34 kg/m as calculated from the following:   Height as of 06/16/22: 5\' 4"  (1.626 m).   Weight as of 06/16/22: 235 lb (106.6 kg). Ideal: Patient weight not recorded  Assessment   Diagnosis Status  No diagnosis found. Controlled Controlled Controlled   Updated Problems: No problems updated.  Plan of Care  Problem-specific:  No problem-specific Assessment & Plan notes found for this encounter.  Ms. KAYLEY ZEIDERS has a current medication list which includes the following long-term medication(s): albuterol, amphetamine-dextroamphetamine, atorvastatin, calcium-magnesium-vitamin d, famotidine, fluticasone, hydrocodone-acetaminophen, metformin,  montelukast, and spironolactone.  Pharmacotherapy (Medications Ordered): No orders of the defined types were placed in this encounter.  Orders:  No orders of the defined types were placed in this encounter.  Follow-up plan:   No follow-ups on file.      Interventional Therapies  Risk Factors  Considerations:     Planned  Pending:      Under consideration:   Diagnostic bilateral lumbar facet block  Possible bilateral lumbar facet RFA.  Diagnostic left L3-4 LESI  Diagnostic left L3 TFESI  Diagnostic left L4 TFESI  Diagnostic bilateral L5 TFESI  Diagnostic left CESI  Diagnostic bilateral cervical facet block  Possible bilateral cervical facet RFA.    Completed:   None at  this time   Completed by other providers:   None at this time   Therapeutic  Palliative (PRN) options:   None established   Pharmacotherapy  Nonopioids transferred to 12/24/2019: Skelaxin        Recent Visits Date Type Provider Dept  06/16/22 Office Visit Delano Metz, MD [facility]  Showing recent visits within past 90 days and meeting all other requirements Future Appointments Date Type Provider Dept  09/15/22 Appointment Delano Metz, MD [facility]  Showing future appointments within next 90 days and meeting all other requirements  I discussed the assessment and treatment plan with the patient. The patient was provided an opportunity to ask questions and all were answered. The patient agreed with the plan and demonstrated an understanding of the instructions.  Patient advised to call back or seek an in-person evaluation if the symptoms or condition worsens.  Duration of encounter: *** minutes.  Total time on encounter, as per AMA guidelines included both the face-to-face and non-face-to-face time personally spent by the physician and/or other qualified health care professional(s) on the day of the encounter (includes time in activities that require the  physician or other qualified health care professional and does not include time in activities normally performed by clinical staff). Physician's time may include the following activities when performed: Preparing to see the patient (e.g., pre-charting review of records, searching for previously ordered imaging, lab work, and nerve conduction tests) Review of prior analgesic pharmacotherapies. Reviewing PMP Interpreting ordered tests (e.g., lab work, imaging, nerve conduction tests) Performing post-procedure evaluations, including interpretation of diagnostic procedures Obtaining and/or reviewing separately obtained history Performing a medically appropriate examination and/or evaluation Counseling and educating the patient/family/caregiver Ordering medications, tests, or procedures Referring and communicating with other health care professionals (when not separately reported) Documenting clinical information in the electronic or other health record Independently interpreting results (not separately reported) and communicating results to the patient/ family/caregiver Care coordination (not separately reported)  Note by: Debra Done, MD Date: 09/15/2022; Time: 12:43 PM

## 2022-09-15 ENCOUNTER — Ambulatory Visit: Payer: Managed Care, Other (non HMO) | Attending: Pain Medicine | Admitting: Pain Medicine

## 2022-09-15 ENCOUNTER — Encounter: Payer: Self-pay | Admitting: Pain Medicine

## 2022-09-15 VITALS — BP 137/70 | HR 87 | Temp 97.5°F | Resp 17 | Ht 64.0 in | Wt 235.0 lb

## 2022-09-15 DIAGNOSIS — M542 Cervicalgia: Secondary | ICD-10-CM

## 2022-09-15 DIAGNOSIS — G894 Chronic pain syndrome: Secondary | ICD-10-CM | POA: Insufficient documentation

## 2022-09-15 DIAGNOSIS — M545 Low back pain, unspecified: Secondary | ICD-10-CM | POA: Insufficient documentation

## 2022-09-15 DIAGNOSIS — Z79891 Long term (current) use of opiate analgesic: Secondary | ICD-10-CM | POA: Diagnosis present

## 2022-09-15 DIAGNOSIS — M79605 Pain in left leg: Secondary | ICD-10-CM

## 2022-09-15 DIAGNOSIS — Z79899 Other long term (current) drug therapy: Secondary | ICD-10-CM | POA: Diagnosis present

## 2022-09-15 DIAGNOSIS — M7918 Myalgia, other site: Secondary | ICD-10-CM | POA: Insufficient documentation

## 2022-09-15 DIAGNOSIS — M47812 Spondylosis without myelopathy or radiculopathy, cervical region: Secondary | ICD-10-CM | POA: Diagnosis present

## 2022-09-15 DIAGNOSIS — G8929 Other chronic pain: Secondary | ICD-10-CM | POA: Diagnosis present

## 2022-09-15 DIAGNOSIS — M47816 Spondylosis without myelopathy or radiculopathy, lumbar region: Secondary | ICD-10-CM | POA: Insufficient documentation

## 2022-09-15 DIAGNOSIS — M5412 Radiculopathy, cervical region: Secondary | ICD-10-CM | POA: Insufficient documentation

## 2022-09-15 DIAGNOSIS — M4722 Other spondylosis with radiculopathy, cervical region: Secondary | ICD-10-CM

## 2022-09-15 MED ORDER — NALOXONE HCL 4 MG/0.1ML NA LIQD: 1.0000 | NASAL | 0 refills | Status: AC | PRN

## 2022-09-15 MED ORDER — HYDROCODONE-ACETAMINOPHEN 5-325 MG PO TABS: 0.5000 | ORAL_TABLET | Freq: Two times a day (BID) | ORAL | 0 refills | Status: DC | PRN

## 2022-09-15 NOTE — Patient Instructions (Signed)
____________________________________________________________________________________________  Opioid Pain Medication Update  To: All patients taking opioid pain medications. (I.e.: hydrocodone, hydromorphone, oxycodone, oxymorphone, morphine, codeine, methadone, tapentadol, tramadol, buprenorphine, fentanyl, etc.)  Re: Updated review of side effects and adverse reactions of opioid analgesics, as well as new information about long term effects of this class of medications.  Direct risks of long-term opioid therapy are not limited to opioid addiction and overdose. Potential medical risks include serious fractures, breathing problems during sleep, hyperalgesia, immunosuppression, chronic constipation, bowel obstruction, myocardial infarction, and tooth decay secondary to xerostomia.  Unpredictable adverse effects that can occur even if you take your medication correctly: Cognitive impairment, respiratory depression, and death. Most people think that if they take their medication "correctly", and "as instructed", that they will be safe. Nothing could be farther from the truth. In reality, a significant amount of recorded deaths associated with the use of opioids has occurred in individuals that had taken the medication for a long time, and were taking their medication correctly. The following are examples of how this can happen: Patient taking his/her medication for a long time, as instructed, without any side effects, is given a certain antibiotic or another unrelated medication, which in turn triggers a "Drug-to-drug interaction" leading to disorientation, cognitive impairment, impaired reflexes, respiratory depression or an untoward event leading to serious bodily harm or injury, including death.  Patient taking his/her medication for a long time, as instructed, without any side effects, develops an acute impairment of liver and/or kidney function. This will lead to a rapid inability of the body to  breakdown and eliminate their pain medication, which will result in effects similar to an "overdose", but with the same medicine and dose that they had always taken. This again may lead to disorientation, cognitive impairment, impaired reflexes, respiratory depression or an untoward event leading to serious bodily harm or injury, including death.  A similar problem will occur with patients as they grow older and their liver and kidney function begins to decrease as part of the aging process.  Background information: Historically, the original case for using long-term opioid therapy to treat chronic noncancer pain was based on safety assumptions that subsequent experience has called into question. In 1996, the American Pain Society and the American Academy of Pain Medicine issued a consensus statement supporting long-term opioid therapy. This statement acknowledged the dangers of opioid prescribing but concluded that the risk for addiction was low; respiratory depression induced by opioids was short-lived, occurred mainly in opioid-naive patients, and was antagonized by pain; tolerance was not a common problem; and efforts to control diversion should not constrain opioid prescribing. This has now proven to be wrong. Experience regarding the risks for opioid addiction, misuse, and overdose in community practice has failed to support these assumptions.  According to the Centers for Disease Control and Prevention, fatal overdoses involving opioid analgesics have increased sharply over the past decade. Currently, more than 96,700 people die from drug overdoses every year. Opioids are a factor in 7 out of every 10 overdose deaths. Deaths from drug overdose have surpassed motor vehicle accidents as the leading cause of death for individuals between the ages of 80 and 61.  Clinical data suggest that neuroendocrine dysfunction may be very common in both men and women, potentially causing hypogonadism, erectile  dysfunction, infertility, decreased libido, osteoporosis, and depression. Recent studies linked higher opioid dose to increased opioid-related mortality. Controlled observational studies reported that long-term opioid therapy may be associated with increased risk for cardiovascular events. Subsequent meta-analysis concluded  that the safety of long-term opioid therapy in elderly patients has not been proven.   Side Effects and adverse reactions: Common side effects: Drowsiness (sedation). Dizziness. Nausea and vomiting. Constipation. Physical dependence -- Dependence often manifests with withdrawal symptoms when opioids are discontinued or decreased. Tolerance -- As you take repeated doses of opioids, you require increased medication to experience the same effect of pain relief. Respiratory depression -- This can occur in healthy people, especially with higher doses. However, people with COPD, asthma or other lung conditions may be even more susceptible to fatal respiratory impairment.  Uncommon side effects: An increased sensitivity to feeling pain and extreme response to pain (hyperalgesia). Chronic use of opioids can lead to this. Delayed gastric emptying (the process by which the contents of your stomach are moved into your small intestine). Muscle rigidity. Immune system and hormonal dysfunction. Quick, involuntary muscle jerks (myoclonus). Arrhythmia. Itchy skin (pruritus). Dry mouth (xerostomia).  Long-term side effects: Chronic constipation. Sleep-disordered breathing (SDB). Increased risk of bone fractures. Hypothalamic-pituitary-adrenal dysregulation. Increased risk of overdose.  RISKS: Respiratory depression and death: Opioids increase the risk of respiratory depression and death.  Drug-to-drug interactions: Opioids are relatively contraindicated in combination with benzodiazepines, sleep inducers, and other central nervous system depressants. Other classes of medications  (i.e.: certain antibiotics and even over-the-counter medications) may also trigger or induce respiratory depression in some patients.  Medical conditions: Patients with pre-existing respiratory problems are at higher risk of respiratory failure and/or depression when in combination with opioid analgesics. Opioids are relatively contraindicated in some medical conditions such as central sleep apnea.   Fractures and Falls:  Opioids increase the risk and incidence of falls. This is of particular importance in elderly patients.  Endocrine System:  Long-term administration is associated with endocrine abnormalities (endocrinopathies). (Also known as Opioid-induced Endocrinopathy) Influences on both the hypothalamic-pituitary-adrenal axis?and the hypothalamic-pituitary-gonadal axis have been demonstrated with consequent hypogonadism and adrenal insufficiency in both sexes. Hypogonadism and decreased levels of dehydroepiandrosterone sulfate have been reported in men and women. Endocrine effects include: Amenorrhoea in women (abnormal absence of menstruation) Reduced libido in both sexes Decreased sexual function Erectile dysfunction in men Hypogonadisms (decreased testicular function with shrinkage of testicles) Infertility Depression and fatigue Loss of muscle mass Anxiety Depression Immune suppression Hyperalgesia Weight gain Anemia Osteoporosis Patients (particularly women of childbearing age) should avoid opioids. There is insufficient evidence to recommend routine monitoring of asymptomatic patients taking opioids in the long-term for hormonal deficiencies.  Immune System: Human studies have demonstrated that opioids have an immunomodulating effect. These effects are mediated via opioid receptors both on immune effector cells and in the central nervous system. Opioids have been demonstrated to have adverse effects on antimicrobial response and anti-tumour surveillance. Buprenorphine has  been demonstrated to have no impact on immune function.  Opioid Induced Hyperalgesia: Human studies have demonstrated that prolonged use of opioids can lead to a state of abnormal pain sensitivity, sometimes called opioid induced hyperalgesia (OIH). Opioid induced hyperalgesia is not usually seen in the absence of tolerance to opioid analgesia. Clinically, hyperalgesia may be diagnosed if the patient on long-term opioid therapy presents with increased pain. This might be qualitatively and anatomically distinct from pain related to disease progression or to breakthrough pain resulting from development of opioid tolerance. Pain associated with hyperalgesia tends to be more diffuse than the pre-existing pain and less defined in quality. Management of opioid induced hyperalgesia requires opioid dose reduction.  Cancer: Chronic opioid therapy has been associated with an increased risk of cancer  among noncancer patients with chronic pain. This association was more evident in chronic strong opioid users. Chronic opioid consumption causes significant pathological changes in the small intestine and colon. Epidemiological studies have found that there is a link between opium dependence and initiation of gastrointestinal cancers. Cancer is the second leading cause of death after cardiovascular disease. Chronic use of opioids can cause multiple conditions such as GERD, immunosuppression and renal damage as well as carcinogenic effects, which are associated with the incidence of cancers.   Mortality: Long-term opioid use has been associated with increased mortality among patients with chronic non-cancer pain (CNCP).  Prescription of long-acting opioids for chronic noncancer pain was associated with a significantly increased risk of all-cause mortality, including deaths from causes other than overdose.  Reference: Von Korff M, Kolodny A, Deyo RA, Chou R. Long-term opioid therapy reconsidered. Ann Intern Med. 2011  Sep 6;155(5):325-8. doi: 10.7326/[id number]. PMID: 64403474; PMCID: [id number]. Randon Goldsmith, Hayward RA, Dunn KM, [place] KP. Risk of adverse events in patients prescribed long-term opioids: A cohort study in the [place] [facility] Research Datalink. Eur J Pain. 2019 May;23(5):908-922. doi: 10.1002/ejp.1357. Epub 2019 Jan 31. PMID: 43329518. Colameco S, Coren JS, Ciervo CA. Continuous opioid treatment for chronic noncancer pain: a time for moderation in prescribing. Postgrad Med. 2009 Jul;121(4):61-6. doi: 10.3810/pgm.2009.07.2032. PMID: 84166063. William Hamburger RN, [place] SD, Blazina I, Cristopher Peru, Bougatsos C, Deyo RA. The effectiveness and risks of long-term opioid therapy for chronic pain: a systematic review for a [employer] of [employer] Pathways to [employer]. Ann Intern Med. 2015 Feb 17;162(4):276-86. doi: 10.7326/M14-2559. PMID: 01601093. Caryl Bis [facility], Makuc DM. NCHS Data Brief No. 22. [place]: Centers for Disease Control and Prevention; 2009. Sep, Increase in Fatal Poisonings Involving Opioid Analgesics in the [place], 1999-2006. Song IA, Choi HR, Oh TK. Long-term opioid use and mortality in patients with chronic non-cancer pain: Ten-year follow-up study in [place] from 2010 through 2019. EClinicalMedicine. 2022 Jul 18;51:101558. doi: 10.1016/j.eclinm.2022.235573. PMID: 22025427; PMCID: [id number]. Huser, W., Schubert, T., Vogelmann, T. et al. All-cause mortality in patients with long-term opioid therapy compared with non-opioid analgesics for chronic non-cancer pain: a database study. BMC Med 18, 162 (2020). http://lester.info/ Rashidian H, Karie Kirks, Malekzadeh R, Haghdoost AA. An Ecological Study of the Association between Opiate Use and Incidence of Cancers. Addict Health. 2016 Fall;8(4):252-260. PMID: 51761607; PMCID: [id number].  Our Goal: Our goal is to control your  pain with means other than the use of opioid pain medications.  Our Recommendation: Talk to your physician about coming off of these medications. We can assist you with the tapering down and stopping these medicines. Based on the new information, even if you cannot completely stop the medication, a decrease in the dose may be associated with a lesser risk. Ask for other means of controlling the pain. Decrease or eliminate those factors that significantly contribute to your pain such as smoking, obesity, and a diet heavily tilted towards "inflammatory" nutrients.  Last Updated: 08/30/2022   ____________________________________________________________________________________________     ____________________________________________________________________________________________  National Pain Medication Shortage  The [place] is experiencing worsening drug shortages. These have had a negative widespread effect on patient care and treatment. Not expected to improve any time soon. Predicted to last past 2029.   Drug shortage list (generic names) Oxycodone IR Oxycodone/APAP Oxymorphone IR Hydromorphone Hydrocodone/APAP Morphine  Where is the problem?  Manufacturing and supply level.  Will this shortage affect you?  Only if you  take any of the above pain medications.  How? You may be unable to fill your prescription.  Your pharmacist may offer a "partial fill" of your prescription. (Warning: Do not accept partial fills.) Prescriptions partially filled cannot be transferred to another pharmacy. Read our Medication Rules and Regulation. Depending on how much medicine you are dependent on, you may experience withdrawals when unable to get the medication.  Recommendations: Consider ending your dependence on opioid pain medications. Ask your pain specialist to assist you with the process. Consider switching to a medication currently not in shortage, such as Buprenorphine. Talk to your pain  specialist about this option. Consider decreasing your pain medication requirements by managing tolerance thru "Drug Holidays". This may help minimize withdrawals, should you run out of medicine. Control your pain thru the use of non-pharmacological interventional therapies.   Your prescriber: Prescribers cannot be blamed for shortages. Medication manufacturing and supply issues cannot be fixed by the prescriber.   NOTE: The prescriber is not responsible for supplying the medication, or solving supply issues. Work with your pharmacist to solve it. The patient is responsible for the decision to take or continue taking the medication and for identifying and securing a legal supply source. By law, supplying the medication is the job and responsibility of the pharmacy. The prescriber is responsible for the evaluation, monitoring, and prescribing of these medications.   Prescribers will NOT: Re-issue prescriptions that have been partially filled. Re-issue prescriptions already sent to a pharmacy.  Re-send prescriptions to a different pharmacy because yours did not have your medication. Ask pharmacist to order more medicine or transfer the prescription to another pharmacy. (Read below.)  New 2023 regulation: "October 23, 2021 Revised Regulation Allows DEA-Registered Pharmacies to Transfer Electronic Prescriptions at a Patient's Request DEA Headquarters Division - Public Information Office Patients now have the ability to request their electronic prescription be transferred to another pharmacy without having to go back to their practitioner to initiate the request. This revised regulation went into effect on Monday, October 19, 2021.     At a patient's request, a DEA-registered retail pharmacy can now transfer an electronic prescription for a controlled substance (schedules II-V) to another DEA-registered retail pharmacy. Prior to this change, patients would have to go through their practitioner to  cancel their prescription and have it re-issued to a different pharmacy. The process was taxing and time consuming for both patients and practitioners.    The Drug Enforcement Administration [facility]) published its intent to revise the process for transferring electronic prescriptions on January 11, 2020.  The final rule was published in the federal register on September 17, 2021 and went into effect 30 days later.  Under the final rule, a prescription can only be transferred once between pharmacies, and only if allowed under existing state or other applicable law. The prescription must remain in its electronic form; may not be altered in any way; and the transfer must be communicated directly between two licensed pharmacists. It's important to note, any authorized refills transfer with the original prescription, which means the entire prescription will be filled at the same pharmacy".  Reference: HugeHand.is [facility] website announcement)  [email or web address] [employer] of Justice)   [employer] / Vol. 88, No. 143 / Thursday, September 17, 2021 / Rules and Regulations DEPARTMENT OF JUSTICE  Drug Enforcement Administration  21 CFR Part 1306  [Docket No. [id number]]  RIN [id number] Transfer of Electronic Prescriptions for Schedules II-V Controlled Substances Between Pharmacies for Initial Filling  ____________________________________________________________________________________________  ____________________________________________________________________________________________  Transfer of Pain Medication between Pharmacies  Re: 2023 DEA Clarification on existing regulation  Published on DEA Website: October 23, 2021  Title: Revised Regulation Allows DEA-Registered Pharmacies to Electrical engineer Prescriptions at a Patient's  Request DEA Headquarters Division - [employer]  "Patients now have the ability to request their electronic prescription be transferred to another pharmacy without having to go back to their practitioner to initiate the request. This revised regulation went into effect on Monday, October 19, 2021.     At a patient's request, a DEA-registered retail pharmacy can now transfer an electronic prescription for a controlled substance (schedules II-V) to another DEA-registered retail pharmacy. Prior to this change, patients would have to go through their practitioner to cancel their prescription and have it re-issued to a different pharmacy. The process was taxing and time consuming for both patients and practitioners.    The Drug Enforcement Administration [facility]) published its intent to revise the process for transferring electronic prescriptions on January 11, 2020.  The final rule was published in the federal register on September 17, 2021 and went into effect 30 days later.  Under the final rule, a prescription can only be transferred once between pharmacies, and only if allowed under existing state or other applicable law. The prescription must remain in its electronic form; may not be altered in any way; and the transfer must be communicated directly between two licensed pharmacists. It's important to note, any authorized refills transfer with the original prescription, which means the entire prescription will be filled at the same pharmacy."    REFERENCES: 1. DEA website announcement [email or web address]  2. Department of Justice website  [email or web address].pdf  3. DEPARTMENT OF JUSTICE Drug Enforcement Administration 21 CFR Part 1306 [Docket No. [id number]] RIN [id number] "Transfer of Electronic Prescriptions for Schedules II-V Controlled Substances  Between Pharmacies for Initial Filling"  ____________________________________________________________________________________________     _______________________________________________________________________  Medication Rules  Purpose: To inform patients, and their family members, of our medication rules and regulations.  Applies to: All patients receiving prescriptions from our practice (written or electronic).  Pharmacy of record: This is the pharmacy where your electronic prescriptions will be sent. Make sure we have the correct one.  Electronic prescriptions: In compliance with the [facility] Strengthen Opioid Misuse Prevention (STOP) Act of 2017 (Session Conni Elliot [phone]), effective February 22, 2018, all controlled substances must be electronically prescribed. Written prescriptions, faxing, or calling prescriptions to a pharmacy will no longer be done.  Prescription refills: These will be provided only during in-person appointments. No medications will be renewed without a "face-to-face" evaluation with your provider. Applies to all prescriptions.  NOTE: The following applies primarily to controlled substances (Opioid* Pain Medications).   Type of encounter (visit): For patients receiving controlled substances, face-to-face visits are required. (Not an option and not up to the patient.)  Patient's responsibilities: Pain Pills: Bring all pain pills to every appointment (except for procedure appointments). Pill Bottles: Bring pills in original pharmacy bottle. Bring bottle, even if empty. Always bring the bottle of the most recent fill.  Medication refills: You are responsible for knowing and keeping track of what medications you are taking and when is it that you will need a refill. The day before your appointment: write a list of all prescriptions that need to be refilled. The day of the appointment: give the list to the admitting nurse. Prescriptions will be written only  during appointments. No prescriptions will be written on procedure days. If you forget a  medication: it will not be "Called in", "Faxed", or "electronically sent". You will need to get another appointment to get these prescribed. No early refills. Do not call asking to have your prescription filled early. Partial  or short prescriptions: Occasionally your pharmacy may not have enough pills to fill your prescription.  NEVER ACCEPT a partial fill or a prescription that is short of the total amount of pills that you were prescribed.  With controlled substances the law allows 72 hours for the pharmacy to complete the prescription.  If the prescription is not completed within 72 hours, the pharmacist will require a new prescription to be written. This means that you will be short on your medicine and we WILL NOT send another prescription to complete your original prescription.  Instead, request the pharmacy to send a carrier to a nearby branch to get enough medication to provide you with your full prescription. Prescription Accuracy: You are responsible for carefully inspecting your prescriptions before leaving our office. Have the discharge nurse carefully go over each prescription with you, before taking them home. Make sure that your name is accurately spelled, that your address is correct. Check the name and dose of your medication to make sure it is accurate. Check the number of pills, and the written instructions to make sure they are clear and accurate. Make sure that you are given enough medication to last until your next medication refill appointment. Taking Medication: Take medication as prescribed. When it comes to controlled substances, taking less pills or less frequently than prescribed is permitted and encouraged. Never take more pills than instructed. Never take the medication more frequently than prescribed.  Inform other Doctors: Always inform, all of your healthcare providers, of all the  medications you take. Pain Medication from other Providers: You are not allowed to accept any additional pain medication from any other Doctor or Healthcare provider. There are two exceptions to this rule. (see below) In the event that you require additional pain medication, you are responsible for notifying us, as stated below. Cough Medicine: Often these contain an opioid, such as codeine or hydrocodone. Never accept or take cough medicine containing these opioids if you are already taking an opioid* medication. The combination may cause respiratory failure and death. Medication Agreement: You are responsible for carefully reading and following our Medication Agreement. This must be signed before receiving any prescriptions from our practice. Safely store a copy of your signed Agreement. Violations to the Agreement will result in no further prescriptions. (Additional copies of our Medication Agreement are available upon request.) Laws, Rules, & Regulations: All patients are expected to follow all [address] and [employer], [employer], Rules, [place] [place]. Ignorance of the Laws does not constitute a valid excuse.  Illegal drugs and Controlled Substances: The use of illegal substances (including, but not limited to marijuana and its derivatives) and/or the illegal use of any controlled substances is strictly prohibited. Violation of this rule may result in the immediate and permanent discontinuation of any and all prescriptions being written by our practice. The use of any illegal substances is prohibited. Adopted CDC guidelines & recommendations: Target dosing levels will be at or below 60 MME/day. Use of benzodiazepines** is not recommended.  Exceptions: There are only two exceptions to the rule of not receiving pain medications from other Healthcare Providers. Exception #1 (Emergencies): In the event of an emergency (i.e.: accident requiring emergency care), you are allowed to receive additional pain  medication. However, you are responsible for: As soon as  you are able, call our office [phone], at any time of the day or night, and leave a message stating your name, the date and nature of the emergency, and the name and dose of the medication prescribed. In the event that your call is answered by a member of our staff, make sure to document and save the date, time, and the name of the person that took your information.  Exception #2 (Planned Surgery): In the event that you are scheduled by another doctor or dentist to have any type of surgery or procedure, you are allowed (for a period no longer than 30 days), to receive additional pain medication, for the acute post-op pain. However, in this case, you are responsible for picking up a copy of our "Post-op Pain Management for Surgeons" handout, and giving it to your surgeon or dentist. This document is available at our office, and does not require an appointment to obtain it. Simply go to our office during business hours (Monday-Thursday from 8:00 AM to 4:00 PM) (Friday 8:00 AM to 12:00 Noon) or if you have a scheduled appointment with [name], prior to your surgery, and ask for it by name. In addition, you are responsible for: calling our office [phone], at any time of the day or night, and leaving a message stating your name, name of your surgeon, type of surgery, and date of procedure or surgery. Failure to comply with your responsibilities may result in termination of therapy involving the controlled substances. Medication Agreement Violation. Following the above rules, including your responsibilities will help you in avoiding a Medication Agreement Violation ("Breaking your Pain Medication Contract").  Consequences:  Not following the above rules may result in permanent discontinuation of medication prescription therapy.  *Opioid medications include: morphine, codeine, oxycodone, oxymorphone, hydrocodone, hydromorphone, meperidine, tramadol,  tapentadol, buprenorphine, fentanyl, methadone. **Benzodiazepine medications include: diazepam (Valium), alprazolam (Xanax), clonazepam (Klonopine), lorazepam (Ativan), clorazepate (Tranxene), chlordiazepoxide (Librium), estazolam (Prosom), oxazepam (Serax), temazepam (Restoril), triazolam (Halcion) (Last updated: 12/15/2021) ______________________________________________________________________    ______________________________________________________________________  Medication Recommendations and Reminders  Applies to: All patients receiving prescriptions (written and/or electronic).  Medication Rules & Regulations: You are responsible for reading, knowing, and following our "Medication Rules" document. These exist for your safety and that of others. They are not flexible and neither are we. Dismissing or ignoring them is an act of "non-compliance" that may result in complete and irreversible termination of such medication therapy. For safety reasons, "non-compliance" will not be tolerated. As with the [place] fundamental legal principle of "ignorance of the law is no defense", we will accept no excuses for not having read and knowing the content of documents provided to you by our practice.  Pharmacy of record:  Definition: This is the pharmacy where your electronic prescriptions will be sent.  We do not endorse any particular pharmacy. It is up to you and your insurance to decide what pharmacy to use.  We do not restrict you in your choice of pharmacy. However, once we write for your prescriptions, we will NOT be re-sending more prescriptions to fix restricted supply problems created by your pharmacy, or your insurance.  The pharmacy listed in the electronic medical record should be the one where you want electronic prescriptions to be sent. If you choose to change pharmacy, simply notify our nursing staff. Changes will be made only during your regular appointments and not over the  phone.  Recommendations: Keep all of your pain medications in a safe place, under lock and key, even  if you live alone. We will NOT replace lost, stolen, or damaged medication. We do not accept "Police Reports" as proof of medications having been stolen. After you fill your prescription, take 1 week's worth of pills and put them away in a safe place. You should keep a separate, properly labeled bottle for this purpose. The remainder should be kept in the original bottle. Use this as your primary supply, until it runs out. Once it's gone, then you know that you have 1 week's worth of medicine, and it is time to come in for a prescription refill. If you do this correctly, it is unlikely that you will ever run out of medicine. To make sure that the above recommendation works, it is very important that you make sure your medication refill appointments are scheduled at least 1 week before you run out of medicine. To do this in an effective manner, make sure that you do not leave the office without scheduling your next medication management appointment. Always ask the nursing staff to show you in your prescription , when your medication will be running out. Then arrange for the receptionist to get you a return appointment, at least 7 days before you run out of medicine. Do not wait until you have 1 or 2 pills left, to come in. This is very poor planning and does not take into consideration that we may need to cancel appointments due to bad weather, sickness, or emergencies affecting our staff. DO NOT ACCEPT A "Partial Fill": If for any reason your pharmacy does not have enough pills/tablets to completely fill or refill your prescription, do not allow for a "partial fill". The law allows the pharmacy to complete that prescription within 72 hours, without requiring a new prescription. If they do not fill the rest of your prescription within those 72 hours, you will need a separate prescription to fill the remaining  amount, which we will NOT provide. If the reason for the partial fill is your insurance, you will need to talk to the pharmacist about payment alternatives for the remaining tablets, but again, DO NOT ACCEPT A PARTIAL FILL, unless you can trust your pharmacist to obtain the remainder of the pills within 72 hours.  Prescription refills and/or changes in medication(s):  Prescription refills, and/or changes in dose or medication, will be conducted only during scheduled medication management appointments. (Applies to both, written and electronic prescriptions.) No refills on procedure days. No medication will be changed or started on procedure days. No changes, adjustments, and/or refills will be conducted on a procedure day. Doing so will interfere with the diagnostic portion of the procedure. No phone refills. No medications will be "called into the pharmacy". No Fax refills. No weekend refills. No Holliday refills. No after hours refills.  Remember:  Business hours are:  Monday to Thursday 8:00 AM to 4:00 PM Provider's Schedule: Delano Metz, MD - Appointments are:  Medication management: Monday and Wednesday 8:00 AM to 4:00 PM Procedure day: Tuesday and Thursday 7:30 AM to 4:00 PM Edward Jolly, MD - Appointments are:  Medication management: Tuesday and Thursday 8:00 AM to 4:00 PM Procedure day: Monday and Wednesday 7:30 AM to 4:00 PM (Last update: 12/15/2021) ______________________________________________________________________   ____________________________________________________________________________________________  Naloxone Nasal Spray  Why am I receiving this medication? [place] STOP ACT requires that all patients taking high dose opioids or at risk of opioids respiratory depression, be prescribed an opioid reversal agent, such as Naloxone (AKA: Narcan).  What is this medication? NALOXONE (  nal OX one) treats opioid overdose, which causes slow or shallow breathing,  severe drowsiness, or trouble staying awake. Call emergency services after using this medication. You may need additional treatment. Naloxone works by reversing the effects of opioids. It belongs to a group of medications called opioid blockers.  COMMON BRAND NAME(S): Kloxxado, Narcan  What should I tell my care team before I take this medication? They need to know if you have any of these conditions: Heart disease Substance use disorder An unusual or allergic reaction to naloxone, other medications, foods, dyes, or preservatives Pregnant or trying to get pregnant Breast-feeding  When to use this medication? This medication is to be used for the treatment of respiratory depression (less than 8 breaths per minute) secondary to opioid overdose.   How to use this medication? This medication is for use in the nose. Lay the person on their back. Support their neck with your hand and allow the head to tilt back before giving the medication. The nasal spray should be given into 1 nostril. After giving the medication, move the person onto their side. Do not remove or test the nasal spray until ready to use. Get emergency medical help right away after giving the first dose of this medication, even if the person wakes up. You should be familiar with how to recognize the signs and symptoms of a narcotic overdose. If more doses are needed, give the additional dose in the other nostril. Talk to your care team about the use of this medication in children. While this medication may be prescribed for children as young as newborns for selected conditions, precautions do apply.  Naloxone Overdosage: If you think you have taken too much of this medicine contact a poison control center or emergency room at once.  NOTE: This medicine is only for you. Do not share this medicine with others.  What if I miss a dose? This does not apply.  What may interact with this medication? This is only used during an  emergency. No interactions are expected during emergency use. This list may not describe all possible interactions. Give your health care provider a list of all the medicines, herbs, non-prescription drugs, or dietary supplements you use. Also tell them if you smoke, drink alcohol, or use illegal drugs. Some items may interact with your medicine.  What should I watch for while using this medication? Keep this medication ready for use in the case of an opioid overdose. Make sure that you have the phone number of your care team and local hospital ready. You may need to have additional doses of this medication. Each nasal spray contains a single dose. Some emergencies may require additional doses. After use, bring the treated person to the nearest hospital or call 911. Make sure the treating care team knows that the person has received a dose of this medication. You will receive additional instructions on what to do during and after use of this medication before an emergency occurs.  What side effects may I notice from receiving this medication? Side effects that you should report to your care team as soon as possible: Allergic reactions--skin rash, itching, hives, swelling of the face, lips, tongue, or throat Side effects that usually do not require medical attention (report these to your care team if they continue or are bothersome): Constipation Dryness or irritation inside the nose Headache Increase in blood pressure Muscle spasms Stuffy nose Toothache This list may not describe all possible side effects. Call your doctor for  medical advice about side effects. You may report side effects to FDA at 1-800-FDA-1088.  Where should I keep my medication? Because this is an emergency medication, you should keep it with you at all times.  Keep out of the reach of children and pets. Store between 20 and 25 degrees C (68 and 77 degrees F). Do not freeze. Throw away any unused medication after the  expiration date. Keep in original box until ready to use.  NOTE: This sheet is a summary. It may not cover all possible information. If you have questions about this medicine, talk to your doctor, pharmacist, or health care provider.   2023 Elsevier/Gold Standard (2020-10-17 00:00:00)  ____________________________________________________________________________________________

## 2022-09-15 NOTE — Progress Notes (Signed)
Nursing Pain Medication Assessment:  Safety precautions to be maintained throughout the outpatient stay will include: orient to surroundings, keep bed in low position, maintain call bell within reach at all times, provide assistance with transfer out of bed and ambulation.  Medication Inspection Compliance: Pill count conducted under aseptic conditions, in front of the patient. Neither the pills nor the bottle was removed from the patient's sight at any time. Once count was completed pills were immediately returned to the patient in their original bottle.  Medication: Hydrocodone/APAP Pill/Patch Count:  18.5 of 60 pills remain Pill/Patch Appearance: Markings consistent with prescribed medication Bottle Appearance: Standard pharmacy container. Clearly labeled. Filled Date: 6 / 81 / 2024 Last Medication intake:  Yesterday

## 2022-10-18 ENCOUNTER — Other Ambulatory Visit: Payer: Self-pay | Admitting: Primary Care

## 2022-10-18 DIAGNOSIS — M7918 Myalgia, other site: Secondary | ICD-10-CM

## 2022-10-18 DIAGNOSIS — M62838 Other muscle spasm: Secondary | ICD-10-CM

## 2022-10-18 NOTE — Telephone Encounter (Signed)
 Patient is due for CPE/follow up in early November, this will be required prior to any further refills.  Please schedule, thank you!

## 2022-10-21 NOTE — Progress Notes (Deleted)
57 y.o. G11P1011 Married Caucasian female here for annual exam.    PCP:     No LMP recorded. Patient is perimenopausal.           Sexually active: {yes no:314532}  The current method of family planning is perimenopausal.    Exercising: {yes [id number]}  {types:19826} Smoker:  no  Health Maintenance: Pap:  03/01/18 neg: HR HPV neg, 05/18/13 WNL History of abnormal Pap:  no MMG:  07/05/22 Breast Density Cat B, BI-RADS CAT 1 neg Colonoscopy:  n/a BMD:   n/a  Result  n/a TDaP:  PCP Gardasil:   no HIV: 2021 neg Hep C: 2021 neg Screening Labs:  Hb today: ***, Urine today: ***   reports that she has never smoked. She has never used smokeless tobacco. She reports that she does not drink alcohol and does not use drugs.  Past Medical History:  Diagnosis Date   Bulging lumbar disc (L3-4) 02/26/2015   Chronic pain syndrome    followed by pain clinic;    back, neck, lower extremities   DDD (degenerative disc disease), cervical    DDD (degenerative disc disease), lumbar    GAD (generalized anxiety disorder)    GERD (gastroesophageal reflux disease)    History of transient ischemic attack (TIA) 06/11/2015   admission in epic--  (02-10-2022 per pt no resiudal's and no s&s since)   Hypertension    MDD (major depressive disorder)    Mild intermittent asthma    followed  by pcp   PCOS (polycystic ovarian syndrome)    Pre-diabetes    Retained intrauterine contraceptive device (IUD)    Seasonal allergic rhinitis    Wears contact lenses     Past Surgical History:  Procedure Laterality Date   CHOLECYSTECTOMY, LAPAROSCOPIC  11/22/1999   @[facility]  by dr d. Magnus Ivan   DILATATION & CURETTAGE/HYSTEROSCOPY WITH MYOSURE N/A 09/10/2014   Procedure: DILATATION & CURETTAGE/HYSTEROSCOPY WITH MYOSURE/INSERTION OF IUD;  Surgeon: Ok Edwards, MD;  Location: [facility];  Service: Gynecology;  Laterality: N/A;   HYSTEROSCOPY N/A 02/11/2022   Procedure: HYSTEROSCOPY WITH MYOSURE RESECTION OF ENDOMETRIAL POLYP,  DILATION AND CURETTAGE;  Surgeon: Patton Salles, MD;  Location: [facility] [place];  Service: Gynecology;  Laterality: N/A;   IUD REMOVAL N/A 02/11/2022   Procedure: INTRAUTERINE DEVICE (IUD) REMOVAL;  Surgeon: Patton Salles, MD;  Location: [facility];  Service: Gynecology;  Laterality: N/A;   LAPAROSCOPIC GASTRIC BANDING  04/2012   WISDOM TOOTH EXTRACTION      Current Outpatient Medications  Medication Sig Dispense Refill   albuterol (VENTOLIN HFA) 108 (90 Base) MCG/ACT inhaler Inhale 2 puffs into the lungs every 6 (six) hours as needed for wheezing or shortness of breath. (Patient taking differently: Inhale 2 puffs into the lungs every 6 (six) hours as needed for wheezing or shortness of breath.) 18 g 0   amphetamine-dextroamphetamine (ADDERALL XR) 20 MG 24 hr capsule Take 20 mg by mouth daily.     atorvastatin (LIPITOR) 10 MG tablet TAKE 1 TABLET(10 MG) BY MOUTH DAILY FOR CHOLESTEROL (Patient taking differently: Take 10 mg by mouth daily. TAKE 1 TABLET(10 MG) BY MOUTH DAILY for cholesterol.) 90 tablet 3   buPROPion (WELLBUTRIN XL) 300 MG 24 hr tablet Take 300 mg by mouth daily.     CALCIUM-MAGNESIUM-VITAMIN D ER PO Take 2 tablets by mouth daily.     cetirizine (ZYRTEC) 10 MG tablet Take 10 mg by mouth at bedtime.  famotidine (PEPCID AC) 10 MG tablet Take 10 mg by mouth as needed for heartburn or indigestion.     fluticasone (FLONASE) 50 MCG/ACT nasal spray SHAKE LIQUID AND USE 1 SPRAY IN EACH NOSTRIL TWICE DAILY AS NEEDED FOR ALLERGIES OR RHINITIS (Patient taking differently: Place 1 spray into both nostrils 2 (two) times daily as needed.) 16 g 3   HYDROcodone-acetaminophen (NORCO/VICODIN) 5-325 MG tablet Take 0.5-1 tablets by mouth 2 (two) times daily as needed for severe pain. Must last 30 days 60 tablet 0   HYDROcodone-acetaminophen (NORCO/VICODIN) 5-325 MG tablet Take 0.5-1 tablets by mouth 2 (two) times daily as needed for severe pain. Must  last 30 days 60 tablet 0   [START ON 11/18/2022] HYDROcodone-acetaminophen (NORCO/VICODIN) 5-325 MG tablet Take 0.5-1 tablets by mouth 2 (two) times daily as needed for severe pain. Must last 30 days 60 tablet 0   hydrocortisone cream 0.5 % Apply 1 Application topically as needed for itching.     ibuprofen (ADVIL) 800 MG tablet Take 1 tablet (800 mg total) by mouth 2 (two) times daily as needed for moderate pain. 180 tablet 0   lamoTRIgine (LAMICTAL) 200 MG tablet Take 300 mg by mouth at bedtime.      LORazepam (ATIVAN) 1 MG tablet Take 0.5-1 mg by mouth every 8 (eight) hours as needed for anxiety or sleep. Per pt takes one nightly regularly     metaxalone (SKELAXIN) 800 MG tablet TAKE 1 TABLET(800 MG) BY MOUTH TWICE DAILY AS NEEDED FOR MUSCLE SPASMS 180 tablet 0   metFORMIN (GLUCOPHAGE-XR) 500 MG 24 hr tablet TAKE 1 TABLET(500 MG) BY MOUTH DAILY WITH BREAKFAST (Patient taking differently: Take 500 mg by mouth daily with breakfast.) 90 tablet 3   montelukast (SINGULAIR) 10 MG tablet TAKE 1 TABLET(10 MG) BY MOUTH AT BEDTIME FOR ALLERGIES 90 tablet 2   naloxone (NARCAN) nasal spray 4 mg/0.1 mL Place 1 spray into the nose as needed for up to 365 doses (for opioid-induced respiratory depresssion). In case of emergency (overdose), spray once into each nostril. If no response within 3 minutes, repeat application and call 911. 1 each 0   PARoxetine (PAXIL-CR) 37.5 MG 24 hr tablet Take 2 tablets by mouth at bedtime.     RA ASPIRIN EC ADULT LOW ST 81 MG EC tablet take 1 tablet by mouth once daily (Patient taking differently: Take 81 mg by mouth daily.) 30 tablet 0   spironolactone (ALDACTONE) 25 MG tablet TAKE 1 TABLET(25 MG) BY MOUTH DAILY (Patient taking differently: Take 25 mg by mouth daily. TAKE 1 TABLET(25 MG) BY MOUTH DAILY) 90 tablet 3   No current facility-administered medications for this visit.    Family History  Problem Relation Age of Onset   Cancer Mother        thyroid cancer   Hypertension  Mother    Hypertension Father    Diabetes Father    Asthma Maternal Grandmother    Stroke Maternal Grandmother    Stroke Maternal Grandfather     Review of Systems  Exam:   There were no vitals taken for this visit.    General appearance: alert, cooperative and appears stated age Head: normocephalic, without obvious abnormality, atraumatic Neck: no adenopathy, supple, symmetrical, trachea midline and thyroid normal to inspection and palpation Lungs: clear to auscultation bilaterally Breasts: normal appearance, no masses or tenderness, No nipple retraction or dimpling, No nipple discharge or bleeding, No axillary adenopathy Heart: regular rate and rhythm Abdomen: soft, non-tender; no masses, no organomegaly  Extremities: extremities normal, atraumatic, no cyanosis or edema Skin: skin color, texture, turgor normal. No rashes or lesions Lymph nodes: cervical, supraclavicular, and axillary nodes normal. Neurologic: grossly normal  Pelvic: External genitalia:  no lesions              No abnormal inguinal nodes palpated.              Urethra:  normal appearing urethra with no masses, tenderness or lesions              Bartholins and Skenes: normal                 Vagina: normal appearing vagina with normal color and discharge, no lesions              Cervix: no lesions              Pap taken: {yes no:314532} Bimanual Exam:  Uterus:  normal size, contour, position, consistency, mobility, non-tender              Adnexa: no mass, fullness, tenderness              Rectal exam: {yes no:314532}.  Confirms.              Anus:  normal sphincter tone, no lesions  Chaperone was present for exam:  ***  Assessment:   Well woman visit with gynecologic exam.   Plan: Mammogram screening discussed. Self breast awareness reviewed. Pap and HR HPV as above. Guidelines for Calcium, Vitamin D, regular exercise program including cardiovascular and weight bearing exercise.   Follow up annually and  prn.   Additional counseling given.  {yes [id number]. _______ minutes face to face time of which over 50% was spent in counseling.    After visit summary provided.

## 2022-11-04 ENCOUNTER — Ambulatory Visit: Payer: Managed Care, Other (non HMO) | Admitting: Obstetrics and Gynecology

## 2022-11-23 ENCOUNTER — Other Ambulatory Visit: Payer: Self-pay

## 2022-11-23 DIAGNOSIS — M7918 Myalgia, other site: Secondary | ICD-10-CM

## 2022-11-23 DIAGNOSIS — G894 Chronic pain syndrome: Secondary | ICD-10-CM

## 2022-11-23 MED ORDER — IBUPROFEN 800 MG PO TABS: 800.0000 mg | ORAL_TABLET | Freq: Two times a day (BID) | ORAL | 0 refills | Status: DC | PRN

## 2022-11-23 NOTE — Telephone Encounter (Signed)
Patient is due for CPE/follow up in mid November, this will be required prior to any further refills.  Please schedule, thank you!

## 2022-11-24 NOTE — Telephone Encounter (Signed)
LVM for patient to call back and schedule

## 2022-12-14 NOTE — Patient Instructions (Signed)
____________________________________________________________________________________________  Opioid Pain Medication Update  To: All patients taking opioid pain medications. (I.e.: hydrocodone, hydromorphone, oxycodone, oxymorphone, morphine, codeine, methadone, tapentadol, tramadol, buprenorphine, fentanyl, etc.)  Re: Updated review of side effects and adverse reactions of opioid analgesics, as well as new information about long term effects of this class of medications.  Direct risks of long-term opioid therapy are not limited to opioid addiction and overdose. Potential medical risks include serious fractures, breathing problems during sleep, hyperalgesia, immunosuppression, chronic constipation, bowel obstruction, myocardial infarction, and tooth decay secondary to xerostomia.  Unpredictable adverse effects that can occur even if you take your medication correctly: Cognitive impairment, respiratory depression, and death. Most people think that if they take their medication "correctly", and "as instructed", that they will be safe. Nothing could be farther from the truth. In reality, a significant amount of recorded deaths associated with the use of opioids has occurred in individuals that had taken the medication for a long time, and were taking their medication correctly. The following are examples of how this can happen: Patient taking his/her medication for a long time, as instructed, without any side effects, is given a certain antibiotic or another unrelated medication, which in turn triggers a "Drug-to-drug interaction" leading to disorientation, cognitive impairment, impaired reflexes, respiratory depression or an untoward event leading to serious bodily harm or injury, including death.  Patient taking his/her medication for a long time, as instructed, without any side effects, develops an acute impairment of liver and/or kidney function. This will lead to a rapid inability of the body to  breakdown and eliminate their pain medication, which will result in effects similar to an "overdose", but with the same medicine and dose that they had always taken. This again may lead to disorientation, cognitive impairment, impaired reflexes, respiratory depression or an untoward event leading to serious bodily harm or injury, including death.  A similar problem will occur with patients as they grow older and their liver and kidney function begins to decrease as part of the aging process.  Background information: Historically, the original case for using long-term opioid therapy to treat chronic noncancer pain was based on safety assumptions that subsequent experience has called into question. In 1996, the American Pain Society and the American Academy of Pain Medicine issued a consensus statement supporting long-term opioid therapy. This statement acknowledged the dangers of opioid prescribing but concluded that the risk for addiction was low; respiratory depression induced by opioids was short-lived, occurred mainly in opioid-naive patients, and was antagonized by pain; tolerance was not a common problem; and efforts to control diversion should not constrain opioid prescribing. This has now proven to be wrong. Experience regarding the risks for opioid addiction, misuse, and overdose in community practice has failed to support these assumptions.  According to the Centers for Disease Control and Prevention, fatal overdoses involving opioid analgesics have increased sharply over the past decade. Currently, more than 96,700 people die from drug overdoses every year. Opioids are a factor in 7 out of every 10 overdose deaths. Deaths from drug overdose have surpassed motor vehicle accidents as the leading cause of death for individuals between the ages of 80 and 61.  Clinical data suggest that neuroendocrine dysfunction may be very common in both men and women, potentially causing hypogonadism, erectile  dysfunction, infertility, decreased libido, osteoporosis, and depression. Recent studies linked higher opioid dose to increased opioid-related mortality. Controlled observational studies reported that long-term opioid therapy may be associated with increased risk for cardiovascular events. Subsequent meta-analysis concluded  that the safety of long-term opioid therapy in elderly patients has not been proven.   Side Effects and adverse reactions: Common side effects: Drowsiness (sedation). Dizziness. Nausea and vomiting. Constipation. Physical dependence -- Dependence often manifests with withdrawal symptoms when opioids are discontinued or decreased. Tolerance -- As you take repeated doses of opioids, you require increased medication to experience the same effect of pain relief. Respiratory depression -- This can occur in healthy people, especially with higher doses. However, people with COPD, asthma or other lung conditions may be even more susceptible to fatal respiratory impairment.  Uncommon side effects: An increased sensitivity to feeling pain and extreme response to pain (hyperalgesia). Chronic use of opioids can lead to this. Delayed gastric emptying (the process by which the contents of your stomach are moved into your small intestine). Muscle rigidity. Immune system and hormonal dysfunction. Quick, involuntary muscle jerks (myoclonus). Arrhythmia. Itchy skin (pruritus). Dry mouth (xerostomia).  Long-term side effects: Chronic constipation. Sleep-disordered breathing (SDB). Increased risk of bone fractures. Hypothalamic-pituitary-adrenal dysregulation. Increased risk of overdose.  RISKS: Respiratory depression and death: Opioids increase the risk of respiratory depression and death.  Drug-to-drug interactions: Opioids are relatively contraindicated in combination with benzodiazepines, sleep inducers, and other central nervous system depressants. Other classes of medications  (i.e.: certain antibiotics and even over-the-counter medications) may also trigger or induce respiratory depression in some patients.  Medical conditions: Patients with pre-existing respiratory problems are at higher risk of respiratory failure and/or depression when in combination with opioid analgesics. Opioids are relatively contraindicated in some medical conditions such as central sleep apnea.   Fractures and Falls:  Opioids increase the risk and incidence of falls. This is of particular importance in elderly patients.  Endocrine System:  Long-term administration is associated with endocrine abnormalities (endocrinopathies). (Also known as Opioid-induced Endocrinopathy) Influences on both the hypothalamic-pituitary-adrenal axis?and the hypothalamic-pituitary-gonadal axis have been demonstrated with consequent hypogonadism and adrenal insufficiency in both sexes. Hypogonadism and decreased levels of dehydroepiandrosterone sulfate have been reported in men and women. Endocrine effects include: Amenorrhoea in women (abnormal absence of menstruation) Reduced libido in both sexes Decreased sexual function Erectile dysfunction in men Hypogonadisms (decreased testicular function with shrinkage of testicles) Infertility Depression and fatigue Loss of muscle mass Anxiety Depression Immune suppression Hyperalgesia Weight gain Anemia Osteoporosis Patients (particularly women of childbearing age) should avoid opioids. There is insufficient evidence to recommend routine monitoring of asymptomatic patients taking opioids in the long-term for hormonal deficiencies.  Immune System: Human studies have demonstrated that opioids have an immunomodulating effect. These effects are mediated via opioid receptors both on immune effector cells and in the central nervous system. Opioids have been demonstrated to have adverse effects on antimicrobial response and anti-tumour surveillance. Buprenorphine has  been demonstrated to have no impact on immune function.  Opioid Induced Hyperalgesia: Human studies have demonstrated that prolonged use of opioids can lead to a state of abnormal pain sensitivity, sometimes called opioid induced hyperalgesia (OIH). Opioid induced hyperalgesia is not usually seen in the absence of tolerance to opioid analgesia. Clinically, hyperalgesia may be diagnosed if the patient on long-term opioid therapy presents with increased pain. This might be qualitatively and anatomically distinct from pain related to disease progression or to breakthrough pain resulting from development of opioid tolerance. Pain associated with hyperalgesia tends to be more diffuse than the pre-existing pain and less defined in quality. Management of opioid induced hyperalgesia requires opioid dose reduction.  Cancer: Chronic opioid therapy has been associated with an increased risk of cancer  among noncancer patients with chronic pain. This association was more evident in chronic strong opioid users. Chronic opioid consumption causes significant pathological changes in the small intestine and colon. Epidemiological studies have found that there is a link between opium dependence and initiation of gastrointestinal cancers. Cancer is the second leading cause of death after cardiovascular disease. Chronic use of opioids can cause multiple conditions such as GERD, immunosuppression and renal damage as well as carcinogenic effects, which are associated with the incidence of cancers.   Mortality: Long-term opioid use has been associated with increased mortality among patients with chronic non-cancer pain (CNCP).  Prescription of long-acting opioids for chronic noncancer pain was associated with a significantly increased risk of all-cause mortality, including deaths from causes other than overdose.  Reference: Von Korff M, Kolodny A, Deyo RA, Chou R. Long-term opioid therapy reconsidered. Ann Intern Med. 2011  Sep 6;155(5):325-8. doi: 10.7326/[id number]. PMID: 64403474; PMCID: [id number]. Randon Goldsmith, Hayward RA, Dunn KM, [place] KP. Risk of adverse events in patients prescribed long-term opioids: A cohort study in the [place] [facility] Research Datalink. Eur J Pain. 2019 May;23(5):908-922. doi: 10.1002/ejp.1357. Epub 2019 Jan 31. PMID: 43329518. Colameco S, Coren JS, Ciervo CA. Continuous opioid treatment for chronic noncancer pain: a time for moderation in prescribing. Postgrad Med. 2009 Jul;121(4):61-6. doi: 10.3810/pgm.2009.07.2032. PMID: 84166063. William Hamburger RN, [place] SD, Blazina I, Cristopher Peru, Bougatsos C, Deyo RA. The effectiveness and risks of long-term opioid therapy for chronic pain: a systematic review for a [employer] of [employer] Pathways to [employer]. Ann Intern Med. 2015 Feb 17;162(4):276-86. doi: 10.7326/M14-2559. PMID: 01601093. Caryl Bis [facility], Makuc DM. NCHS Data Brief No. 22. [place]: Centers for Disease Control and Prevention; 2009. Sep, Increase in Fatal Poisonings Involving Opioid Analgesics in the [place], 1999-2006. Song IA, Choi HR, Oh TK. Long-term opioid use and mortality in patients with chronic non-cancer pain: Ten-year follow-up study in [place] from 2010 through 2019. EClinicalMedicine. 2022 Jul 18;51:101558. doi: 10.1016/j.eclinm.2022.235573. PMID: 22025427; PMCID: [id number]. Huser, W., Schubert, T., Vogelmann, T. et al. All-cause mortality in patients with long-term opioid therapy compared with non-opioid analgesics for chronic non-cancer pain: a database study. BMC Med 18, 162 (2020). http://lester.info/ Rashidian H, Karie Kirks, Malekzadeh R, Haghdoost AA. An Ecological Study of the Association between Opiate Use and Incidence of Cancers. Addict Health. 2016 Fall;8(4):252-260. PMID: 51761607; PMCID: [id number].  Our Goal: Our goal is to control your  pain with means other than the use of opioid pain medications.  Our Recommendation: Talk to your physician about coming off of these medications. We can assist you with the tapering down and stopping these medicines. Based on the new information, even if you cannot completely stop the medication, a decrease in the dose may be associated with a lesser risk. Ask for other means of controlling the pain. Decrease or eliminate those factors that significantly contribute to your pain such as smoking, obesity, and a diet heavily tilted towards "inflammatory" nutrients.  Last Updated: 08/30/2022   ____________________________________________________________________________________________     ____________________________________________________________________________________________  National Pain Medication Shortage  The [place] is experiencing worsening drug shortages. These have had a negative widespread effect on patient care and treatment. Not expected to improve any time soon. Predicted to last past 2029.   Drug shortage list (generic names) Oxycodone IR Oxycodone/APAP Oxymorphone IR Hydromorphone Hydrocodone/APAP Morphine  Where is the problem?  Manufacturing and supply level.  Will this shortage affect you?  Only if you  take any of the above pain medications.  How? You may be unable to fill your prescription.  Your pharmacist may offer a "partial fill" of your prescription. (Warning: Do not accept partial fills.) Prescriptions partially filled cannot be transferred to another pharmacy. Read our Medication Rules and Regulation. Depending on how much medicine you are dependent on, you may experience withdrawals when unable to get the medication.  Recommendations: Consider ending your dependence on opioid pain medications. Ask your pain specialist to assist you with the process. Consider switching to a medication currently not in shortage, such as Buprenorphine. Talk to your pain  specialist about this option. Consider decreasing your pain medication requirements by managing tolerance thru "Drug Holidays". This may help minimize withdrawals, should you run out of medicine. Control your pain thru the use of non-pharmacological interventional therapies.   Your prescriber: Prescribers cannot be blamed for shortages. Medication manufacturing and supply issues cannot be fixed by the prescriber.   NOTE: The prescriber is not responsible for supplying the medication, or solving supply issues. Work with your pharmacist to solve it. The patient is responsible for the decision to take or continue taking the medication and for identifying and securing a legal supply source. By law, supplying the medication is the job and responsibility of the pharmacy. The prescriber is responsible for the evaluation, monitoring, and prescribing of these medications.   Prescribers will NOT: Re-issue prescriptions that have been partially filled. Re-issue prescriptions already sent to a pharmacy.  Re-send prescriptions to a different pharmacy because yours did not have your medication. Ask pharmacist to order more medicine or transfer the prescription to another pharmacy. (Read below.)  New 2023 regulation: "October 23, 2021 Revised Regulation Allows DEA-Registered Pharmacies to Transfer Electronic Prescriptions at a Patient's Request DEA Headquarters Division - Public Information Office Patients now have the ability to request their electronic prescription be transferred to another pharmacy without having to go back to their practitioner to initiate the request. This revised regulation went into effect on Monday, October 19, 2021.     At a patient's request, a DEA-registered retail pharmacy can now transfer an electronic prescription for a controlled substance (schedules II-V) to another DEA-registered retail pharmacy. Prior to this change, patients would have to go through their practitioner to  cancel their prescription and have it re-issued to a different pharmacy. The process was taxing and time consuming for both patients and practitioners.    The Drug Enforcement Administration [facility]) published its intent to revise the process for transferring electronic prescriptions on January 11, 2020.  The final rule was published in the federal register on September 17, 2021 and went into effect 30 days later.  Under the final rule, a prescription can only be transferred once between pharmacies, and only if allowed under existing state or other applicable law. The prescription must remain in its electronic form; may not be altered in any way; and the transfer must be communicated directly between two licensed pharmacists. It's important to note, any authorized refills transfer with the original prescription, which means the entire prescription will be filled at the same pharmacy".  Reference: HugeHand.is [facility] website announcement)  [email or web address] [employer] of Justice)   [employer] / Vol. 88, No. 143 / Thursday, September 17, 2021 / Rules and Regulations DEPARTMENT OF JUSTICE  Drug Enforcement Administration  21 CFR Part 1306  [Docket No. [id number]]  RIN [id number] Transfer of Electronic Prescriptions for Schedules II-V Controlled Substances Between Pharmacies for Initial Filling  ____________________________________________________________________________________________  ____________________________________________________________________________________________  Transfer of Pain Medication between Pharmacies  Re: 2023 DEA Clarification on existing regulation  Published on DEA Website: October 23, 2021  Title: Revised Regulation Allows DEA-Registered Pharmacies to Electrical engineer Prescriptions at a Patient's  Request DEA Headquarters Division - [employer]  "Patients now have the ability to request their electronic prescription be transferred to another pharmacy without having to go back to their practitioner to initiate the request. This revised regulation went into effect on Monday, October 19, 2021.     At a patient's request, a DEA-registered retail pharmacy can now transfer an electronic prescription for a controlled substance (schedules II-V) to another DEA-registered retail pharmacy. Prior to this change, patients would have to go through their practitioner to cancel their prescription and have it re-issued to a different pharmacy. The process was taxing and time consuming for both patients and practitioners.    The Drug Enforcement Administration [facility]) published its intent to revise the process for transferring electronic prescriptions on January 11, 2020.  The final rule was published in the federal register on September 17, 2021 and went into effect 30 days later.  Under the final rule, a prescription can only be transferred once between pharmacies, and only if allowed under existing state or other applicable law. The prescription must remain in its electronic form; may not be altered in any way; and the transfer must be communicated directly between two licensed pharmacists. It's important to note, any authorized refills transfer with the original prescription, which means the entire prescription will be filled at the same pharmacy."    REFERENCES: 1. DEA website announcement [email or web address]  2. Department of Justice website  [email or web address].pdf  3. DEPARTMENT OF JUSTICE Drug Enforcement Administration 21 CFR Part 1306 [Docket No. [id number]] RIN [id number] "Transfer of Electronic Prescriptions for Schedules II-V Controlled Substances  Between Pharmacies for Initial Filling"  ____________________________________________________________________________________________     _______________________________________________________________________  Medication Rules  Purpose: To inform patients, and their family members, of our medication rules and regulations.  Applies to: All patients receiving prescriptions from our practice (written or electronic).  Pharmacy of record: This is the pharmacy where your electronic prescriptions will be sent. Make sure we have the correct one.  Electronic prescriptions: In compliance with the [facility] Strengthen Opioid Misuse Prevention (STOP) Act of 2017 (Session Conni Elliot [phone]), effective February 22, 2018, all controlled substances must be electronically prescribed. Written prescriptions, faxing, or calling prescriptions to a pharmacy will no longer be done.  Prescription refills: These will be provided only during in-person appointments. No medications will be renewed without a "face-to-face" evaluation with your provider. Applies to all prescriptions.  NOTE: The following applies primarily to controlled substances (Opioid* Pain Medications).   Type of encounter (visit): For patients receiving controlled substances, face-to-face visits are required. (Not an option and not up to the patient.)  Patient's responsibilities: Pain Pills: Bring all pain pills to every appointment (except for procedure appointments). Pill Bottles: Bring pills in original pharmacy bottle. Bring bottle, even if empty. Always bring the bottle of the most recent fill.  Medication refills: You are responsible for knowing and keeping track of what medications you are taking and when is it that you will need a refill. The day before your appointment: write a list of all prescriptions that need to be refilled. The day of the appointment: give the list to the admitting nurse. Prescriptions will be written only  during appointments. No prescriptions will be written on procedure days. If you forget a  medication: it will not be "Called in", "Faxed", or "electronically sent". You will need to get another appointment to get these prescribed. No early refills. Do not call asking to have your prescription filled early. Partial  or short prescriptions: Occasionally your pharmacy may not have enough pills to fill your prescription.  NEVER ACCEPT a partial fill or a prescription that is short of the total amount of pills that you were prescribed.  With controlled substances the law allows 72 hours for the pharmacy to complete the prescription.  If the prescription is not completed within 72 hours, the pharmacist will require a new prescription to be written. This means that you will be short on your medicine and we WILL NOT send another prescription to complete your original prescription.  Instead, request the pharmacy to send a carrier to a nearby branch to get enough medication to provide you with your full prescription. Prescription Accuracy: You are responsible for carefully inspecting your prescriptions before leaving our office. Have the discharge nurse carefully go over each prescription with you, before taking them home. Make sure that your name is accurately spelled, that your address is correct. Check the name and dose of your medication to make sure it is accurate. Check the number of pills, and the written instructions to make sure they are clear and accurate. Make sure that you are given enough medication to last until your next medication refill appointment. Taking Medication: Take medication as prescribed. When it comes to controlled substances, taking less pills or less frequently than prescribed is permitted and encouraged. Never take more pills than instructed. Never take the medication more frequently than prescribed.  Inform other Doctors: Always inform, all of your healthcare providers, of all the  medications you take. Pain Medication from other Providers: You are not allowed to accept any additional pain medication from any other Doctor or Healthcare provider. There are two exceptions to this rule. (see below) In the event that you require additional pain medication, you are responsible for notifying us, as stated below. Cough Medicine: Often these contain an opioid, such as codeine or hydrocodone. Never accept or take cough medicine containing these opioids if you are already taking an opioid* medication. The combination may cause respiratory failure and death. Medication Agreement: You are responsible for carefully reading and following our Medication Agreement. This must be signed before receiving any prescriptions from our practice. Safely store a copy of your signed Agreement. Violations to the Agreement will result in no further prescriptions. (Additional copies of our Medication Agreement are available upon request.) Laws, Rules, & Regulations: All patients are expected to follow all [address] and [employer], [employer], Rules, [place] [place]. Ignorance of the Laws does not constitute a valid excuse.  Illegal drugs and Controlled Substances: The use of illegal substances (including, but not limited to marijuana and its derivatives) and/or the illegal use of any controlled substances is strictly prohibited. Violation of this rule may result in the immediate and permanent discontinuation of any and all prescriptions being written by our practice. The use of any illegal substances is prohibited. Adopted CDC guidelines & recommendations: Target dosing levels will be at or below 60 MME/day. Use of benzodiazepines** is not recommended.  Exceptions: There are only two exceptions to the rule of not receiving pain medications from other Healthcare Providers. Exception #1 (Emergencies): In the event of an emergency (i.e.: accident requiring emergency care), you are allowed to receive additional pain  medication. However, you are responsible for: As soon as  you are able, call our office [phone], at any time of the day or night, and leave a message stating your name, the date and nature of the emergency, and the name and dose of the medication prescribed. In the event that your call is answered by a member of our staff, make sure to document and save the date, time, and the name of the person that took your information.  Exception #2 (Planned Surgery): In the event that you are scheduled by another doctor or dentist to have any type of surgery or procedure, you are allowed (for a period no longer than 30 days), to receive additional pain medication, for the acute post-op pain. However, in this case, you are responsible for picking up a copy of our "Post-op Pain Management for Surgeons" handout, and giving it to your surgeon or dentist. This document is available at our office, and does not require an appointment to obtain it. Simply go to our office during business hours (Monday-Thursday from 8:00 AM to 4:00 PM) (Friday 8:00 AM to 12:00 Noon) or if you have a scheduled appointment with [name], prior to your surgery, and ask for it by name. In addition, you are responsible for: calling our office [phone], at any time of the day or night, and leaving a message stating your name, name of your surgeon, type of surgery, and date of procedure or surgery. Failure to comply with your responsibilities may result in termination of therapy involving the controlled substances. Medication Agreement Violation. Following the above rules, including your responsibilities will help you in avoiding a Medication Agreement Violation ("Breaking your Pain Medication Contract").  Consequences:  Not following the above rules may result in permanent discontinuation of medication prescription therapy.  *Opioid medications include: morphine, codeine, oxycodone, oxymorphone, hydrocodone, hydromorphone, meperidine, tramadol,  tapentadol, buprenorphine, fentanyl, methadone. **Benzodiazepine medications include: diazepam (Valium), alprazolam (Xanax), clonazepam (Klonopine), lorazepam (Ativan), clorazepate (Tranxene), chlordiazepoxide (Librium), estazolam (Prosom), oxazepam (Serax), temazepam (Restoril), triazolam (Halcion) (Last updated: 12/15/2021) ______________________________________________________________________    ______________________________________________________________________  Medication Recommendations and Reminders  Applies to: All patients receiving prescriptions (written and/or electronic).  Medication Rules & Regulations: You are responsible for reading, knowing, and following our "Medication Rules" document. These exist for your safety and that of others. They are not flexible and neither are we. Dismissing or ignoring them is an act of "non-compliance" that may result in complete and irreversible termination of such medication therapy. For safety reasons, "non-compliance" will not be tolerated. As with the [place] fundamental legal principle of "ignorance of the law is no defense", we will accept no excuses for not having read and knowing the content of documents provided to you by our practice.  Pharmacy of record:  Definition: This is the pharmacy where your electronic prescriptions will be sent.  We do not endorse any particular pharmacy. It is up to you and your insurance to decide what pharmacy to use.  We do not restrict you in your choice of pharmacy. However, once we write for your prescriptions, we will NOT be re-sending more prescriptions to fix restricted supply problems created by your pharmacy, or your insurance.  The pharmacy listed in the electronic medical record should be the one where you want electronic prescriptions to be sent. If you choose to change pharmacy, simply notify our nursing staff. Changes will be made only during your regular appointments and not over the  phone.  Recommendations: Keep all of your pain medications in a safe place, under lock and key, even  if you live alone. We will NOT replace lost, stolen, or damaged medication. We do not accept "Police Reports" as proof of medications having been stolen. After you fill your prescription, take 1 week's worth of pills and put them away in a safe place. You should keep a separate, properly labeled bottle for this purpose. The remainder should be kept in the original bottle. Use this as your primary supply, until it runs out. Once it's gone, then you know that you have 1 week's worth of medicine, and it is time to come in for a prescription refill. If you do this correctly, it is unlikely that you will ever run out of medicine. To make sure that the above recommendation works, it is very important that you make sure your medication refill appointments are scheduled at least 1 week before you run out of medicine. To do this in an effective manner, make sure that you do not leave the office without scheduling your next medication management appointment. Always ask the nursing staff to show you in your prescription , when your medication will be running out. Then arrange for the receptionist to get you a return appointment, at least 7 days before you run out of medicine. Do not wait until you have 1 or 2 pills left, to come in. This is very poor planning and does not take into consideration that we may need to cancel appointments due to bad weather, sickness, or emergencies affecting our staff. DO NOT ACCEPT A "Partial Fill": If for any reason your pharmacy does not have enough pills/tablets to completely fill or refill your prescription, do not allow for a "partial fill". The law allows the pharmacy to complete that prescription within 72 hours, without requiring a new prescription. If they do not fill the rest of your prescription within those 72 hours, you will need a separate prescription to fill the remaining  amount, which we will NOT provide. If the reason for the partial fill is your insurance, you will need to talk to the pharmacist about payment alternatives for the remaining tablets, but again, DO NOT ACCEPT A PARTIAL FILL, unless you can trust your pharmacist to obtain the remainder of the pills within 72 hours.  Prescription refills and/or changes in medication(s):  Prescription refills, and/or changes in dose or medication, will be conducted only during scheduled medication management appointments. (Applies to both, written and electronic prescriptions.) No refills on procedure days. No medication will be changed or started on procedure days. No changes, adjustments, and/or refills will be conducted on a procedure day. Doing so will interfere with the diagnostic portion of the procedure. No phone refills. No medications will be "called into the pharmacy". No Fax refills. No weekend refills. No Holliday refills. No after hours refills.  Remember:  Business hours are:  Monday to Thursday 8:00 AM to 4:00 PM Provider's Schedule: Delano Metz, MD - Appointments are:  Medication management: Monday and Wednesday 8:00 AM to 4:00 PM Procedure day: Tuesday and Thursday 7:30 AM to 4:00 PM Edward Jolly, MD - Appointments are:  Medication management: Tuesday and Thursday 8:00 AM to 4:00 PM Procedure day: Monday and Wednesday 7:30 AM to 4:00 PM (Last update: 12/15/2021) ______________________________________________________________________   ____________________________________________________________________________________________  Naloxone Nasal Spray  Why am I receiving this medication? [place] STOP ACT requires that all patients taking high dose opioids or at risk of opioids respiratory depression, be prescribed an opioid reversal agent, such as Naloxone (AKA: Narcan).  What is this medication? NALOXONE (  nal OX one) treats opioid overdose, which causes slow or shallow breathing,  severe drowsiness, or trouble staying awake. Call emergency services after using this medication. You may need additional treatment. Naloxone works by reversing the effects of opioids. It belongs to a group of medications called opioid blockers.  COMMON BRAND NAME(S): Kloxxado, Narcan  What should I tell my care team before I take this medication? They need to know if you have any of these conditions: Heart disease Substance use disorder An unusual or allergic reaction to naloxone, other medications, foods, dyes, or preservatives Pregnant or trying to get pregnant Breast-feeding  When to use this medication? This medication is to be used for the treatment of respiratory depression (less than 8 breaths per minute) secondary to opioid overdose.   How to use this medication? This medication is for use in the nose. Lay the person on their back. Support their neck with your hand and allow the head to tilt back before giving the medication. The nasal spray should be given into 1 nostril. After giving the medication, move the person onto their side. Do not remove or test the nasal spray until ready to use. Get emergency medical help right away after giving the first dose of this medication, even if the person wakes up. You should be familiar with how to recognize the signs and symptoms of a narcotic overdose. If more doses are needed, give the additional dose in the other nostril. Talk to your care team about the use of this medication in children. While this medication may be prescribed for children as young as newborns for selected conditions, precautions do apply.  Naloxone Overdosage: If you think you have taken too much of this medicine contact a poison control center or emergency room at once.  NOTE: This medicine is only for you. Do not share this medicine with others.  What if I miss a dose? This does not apply.  What may interact with this medication? This is only used during an  emergency. No interactions are expected during emergency use. This list may not describe all possible interactions. Give your health care provider a list of all the medicines, herbs, non-prescription drugs, or dietary supplements you use. Also tell them if you smoke, drink alcohol, or use illegal drugs. Some items may interact with your medicine.  What should I watch for while using this medication? Keep this medication ready for use in the case of an opioid overdose. Make sure that you have the phone number of your care team and local hospital ready. You may need to have additional doses of this medication. Each nasal spray contains a single dose. Some emergencies may require additional doses. After use, bring the treated person to the nearest hospital or call 911. Make sure the treating care team knows that the person has received a dose of this medication. You will receive additional instructions on what to do during and after use of this medication before an emergency occurs.  What side effects may I notice from receiving this medication? Side effects that you should report to your care team as soon as possible: Allergic reactions--skin rash, itching, hives, swelling of the face, lips, tongue, or throat Side effects that usually do not require medical attention (report these to your care team if they continue or are bothersome): Constipation Dryness or irritation inside the nose Headache Increase in blood pressure Muscle spasms Stuffy nose Toothache This list may not describe all possible side effects. Call your doctor for  medical advice about side effects. You may report side effects to FDA at 1-800-FDA-1088.  Where should I keep my medication? Because this is an emergency medication, you should keep it with you at all times.  Keep out of the reach of children and pets. Store between 20 and 25 degrees C (68 and 77 degrees F). Do not freeze. Throw away any unused medication after the  expiration date. Keep in original box until ready to use.  NOTE: This sheet is a summary. It may not cover all possible information. If you have questions about this medicine, talk to your doctor, pharmacist, or health care provider.   2023 Elsevier/Gold Standard (2020-10-17 00:00:00)  ____________________________________________________________________________________________

## 2022-12-14 NOTE — Progress Notes (Unsigned)
PROVIDER NOTE: Information contained herein reflects review and annotations entered in association with encounter. Interpretation of such information and data should be left to medically-trained personnel. Information provided to patient can be located elsewhere in the medical record under "Patient Instructions". Document created using STT-dictation technology, any transcriptional errors that may result from process are unintentional.    Patient: Debra Myers  Service Category: E/M  Provider: Oswaldo Done, MD  DOB: [birth year]  DOS: 12/15/2022  Referring Provider: Doreene Nest, NP  MRN: [id number]  Specialty: Interventional Pain Management  PCP: Doreene Nest, NP  Type: Established Patient  Setting: Ambulatory outpatient    Location: Office  Delivery: Face-to-face     HPI  [name], a 57 y.o. year old female, is here today because of her No primary diagnosis found.. Ms. Kreutzer's primary complain today is No chief complaint on file.  Pertinent problems: Ms. Childress has Numbness of upper extremity; Radiculitis involving upper extremity; Chronic low back pain (1ry area of Pain) (Bilateral) (L>R); Lumbar spondylosis (Bulging Disc & Severe Left Foraminal Stenosis at L3-4); Chronic lower extremity pain (2ry area of Pain) (Left); Lumbar foraminal stenosis (Severe Left L3-4); Lumbar facet syndrome (Bilateral) (L>R); Cervical spondylosis (C5-6 & C6-7 DDD); Cervical (3 mm) Grade 1 Anterolisthesis of C4 over C5; Chronic neck pain (3ry area of Pain) (Bilateral) (L>R); Chronic cervical radicular pain (Bilateral) (L>R); Cervical foraminal stenosis (multilevel) (Bilateral); Muscle spasm, nocturnal; Musculoskeletal pain; Carpal tunnel syndrome (Bilateral) (L>R); Cervical facet syndrome (Bilateral) (L>R); Chronic pain syndrome; DDD (degenerative disc disease), lumbar; DDD (degenerative disc disease), cervical; Pain in right knee; Chronic pain of left knee; Osteoarthritis involving multiple joints;  Acute exacerbation of chronic low back pain; and Pain in left knee on their pertinent problem list. Pain Assessment: Severity of   is reported as a  /10. Location:    / . Onset:  . Quality:  . Timing:  . Modifying factor(s):  Marland Kitchen Vitals:  vitals were not taken for this visit.  BMI: Estimated body mass index is 40.34 kg/m as calculated from the following:   Height as of 09/15/22: 5\' 4"  (1.626 m).   Weight as of 09/15/22: 235 lb (106.6 kg). Last encounter: 09/15/2022. Last procedure: Visit date not found.  Reason for encounter: medication management. ***  Pharmacotherapy Assessment  Analgesic: Hydrocodone/APAP 5/325, 2 tab PO QD (10 mg/day of hydrocodone) MME/day: 10 mg/day.   Monitoring: [place] PMP: PDMP reviewed during this encounter.       Pharmacotherapy: No side-effects or adverse reactions reported. Compliance: No problems identified. Effectiveness: Clinically acceptable.  No notes on file  No results found for: "CBDTHCR" No results found for: "D8THCCBX" No results found for: "D9THCCBX"  UDS:  Summary  Date Value Ref Range Status  09/15/2022 Note  Final    Comment:    ==================================================================== [facility] (MW) ==================================================================== Test                             Result       Flag       Units  Drug Present and Declared for Prescription Verification   Amphetamine                    3067         EXPECTED   ng/mg creat    Amphetamine is available as a schedule II prescription drug.    Lorazepam  543          EXPECTED   ng/mg creat    Source of lorazepam is a scheduled prescription medication.    Hydrocodone                    439          EXPECTED   ng/mg creat   Dihydrocodeine                 138          EXPECTED   ng/mg creat   Norhydrocodone                 688          EXPECTED   ng/mg creat    Sources of hydrocodone include scheduled prescription  medications.    Dihydrocodeine and norhydrocodone are expected metabolites of    hydrocodone. Dihydrocodeine is also available as a scheduled    prescription medication.  ==================================================================== Test                      Result    Flag   Units      Ref Range   Creatinine              136              mg/dL      >=44 ==================================================================== Declared Medications:  The flagging and interpretation on this report are based on the  following declared medications.  Unexpected results may arise from  inaccuracies in the declared medications.   **Note: The testing scope of this panel includes these medications:   Amphetamine (Adderall)  Hydrocodone (Norco)  Lorazepam (Ativan)   **Note: The testing scope of this panel does not include the  following reported medications:   Acetaminophen (Norco)  Albuterol (Ventolin HFA)  Aspirin  Atorvastatin (Lipitor)  Bupropion (Wellbutrin XL)  Calcium  Cetirizine (Zyrtec)  Famotidine (Pepcid)  Fluticasone (Flonase)  Hydrocortisone  Ibuprofen (Advil)  Lamotrigine (Lamictal)  Magnesium  Metaxalone (Skelaxin)  Metformin (Glucophage)  Montelukast (Singulair)  Naloxone (Narcan)  Paroxetine (Paxil)  Spironolactone (Aldactone)  Vitamin D ==================================================================== For clinical consultation, please call [phone]. ====================================================================       ROS  Constitutional: Denies any fever or chills Gastrointestinal: No reported hemesis, hematochezia, vomiting, or acute GI distress Musculoskeletal: Denies any acute onset joint swelling, redness, loss of ROM, or weakness Neurological: No reported episodes of acute onset apraxia, aphasia, dysarthria, agnosia, amnesia, paralysis, loss of coordination, or loss of consciousness  Medication Review  Calcium-Magnesium-Vitamin D,  HYDROcodone-acetaminophen, LORazepam, PARoxetine, albuterol, amphetamine-dextroamphetamine, aspirin EC, atorvastatin, buPROPion, cetirizine, famotidine, fluticasone, hydrocortisone cream, ibuprofen, lamoTRIgine, metFORMIN, metaxalone, montelukast, naloxone, and spironolactone  History Review  Allergy: Ms. Villari is allergic to oxycodone, penicillins, red dye #40 (allura red), and sulfa antibiotics. Drug: Ms. Diebel  reports no history of drug use. Alcohol:  reports no history of alcohol use. Tobacco:  reports that she has never smoked. She has never used smokeless tobacco. Social: Ms. Bartholf  reports that she has never smoked. She has never used smokeless tobacco. She reports that she does not drink alcohol and does not use drugs. Medical:  has a past medical history of Bulging lumbar disc (L3-4) (02/26/2015), Chronic pain syndrome, DDD (degenerative disc disease), cervical, DDD (degenerative disc disease), lumbar, GAD (generalized anxiety disorder), GERD (gastroesophageal reflux disease), History of transient ischemic attack (TIA) (06/11/2015), Hypertension, MDD (major depressive disorder), Mild intermittent asthma, PCOS (  polycystic ovarian syndrome), Pre-diabetes, Retained intrauterine contraceptive device (IUD), Seasonal allergic rhinitis, and Wears contact lenses. Surgical: Ms. Biermann  has a past surgical history that includes Cholecystectomy, laparoscopic (11/22/1999); Wisdom tooth extraction; Dilatation & curettage/hysteroscopy with myosure (N/A, 09/10/2014); Laparoscopic gastric banding (04/2012); Hysteroscopy (N/A, 02/11/2022); and IUD removal (N/A, 02/11/2022). Family: family history includes Asthma in her maternal grandmother; Cancer in her mother; Diabetes in her father; Hypertension in her father and mother; Stroke in her maternal grandfather and maternal grandmother.  Laboratory Chemistry Profile   Renal Lab Results  Component Value Date   BUN 19 02/11/2022   CREATININE 0.90 02/11/2022    BCR NOT APPLICABLE 11/23/2019   GFR 70.13 12/29/2021   GFRAA >60 12/16/2017   GFRNONAA >60 02/11/2022    Hepatic Lab Results  Component Value Date   AST 18 12/29/2021   ALT 18 12/29/2021   ALBUMIN 4.4 12/29/2021   ALKPHOS 110 12/29/2021   HCVAB NEGATIVE 05/18/2013    Electrolytes Lab Results  Component Value Date   NA 137 02/11/2022   K 4.4 02/11/2022   CL 105 02/11/2022   CALCIUM 9.2 02/11/2022   PHOS 4.0 10/31/2015    Bone Lab Results  Component Value Date   VD25OH 27.38 (L) 12/29/2021    Inflammation (CRP: Acute Phase) (ESR: Chronic Phase) Lab Results  Component Value Date   ESRSEDRATE 32 05/26/2017         Note: Above Lab results reviewed.  Recent Imaging Review  US Transvaginal Non-OB Indication:  retained IUD, possible recent expulsion of IUD.   Findings: Pelvic US  Uterus 5.82 x 4.49 x 2.59 cm.  IUD in endometrial canal.  Left ovary 2.36 x 1.4 cm.  Right ovary 2.51 x 1.58 cm.  No adnexal masses.  No free fluid.   Impression:  IUD in normal position in the endometrial canal. Note: Reviewed        Physical Exam  General appearance: Well nourished, well developed, and well hydrated. In no apparent acute distress Mental status: Alert, oriented x 3 (person, place, & time)       Respiratory: No evidence of acute respiratory distress Eyes: PERLA Vitals: There were no vitals taken for this visit. BMI: Estimated body mass index is 40.34 kg/m as calculated from the following:   Height as of 09/15/22: 5\' 4"  (1.626 m).   Weight as of 09/15/22: 235 lb (106.6 kg). Ideal: Patient weight not recorded  Assessment   Diagnosis Status  1. Chronic low back pain (1ry area of Pain) (Bilateral) (L>R)   2. Chronic lower extremity pain (2ry area of Pain) (Left)   3. Chronic neck pain (3ry area of Pain) (Bilateral) (L>R)   4. Cervical facet syndrome (Bilateral) (L>R)   5. Lumbar facet syndrome (Bilateral) (L>R)   6. Chronic cervical radicular pain (Bilateral) (L>R)    7. Musculoskeletal pain   8. Chronic pain syndrome   9. Pharmacologic therapy   10. Chronic use of opiate for therapeutic purpose   11. Encounter for medication management   12. Encounter for chronic pain management    Controlled Controlled Controlled   Updated Problems: No problems updated.  Plan of Care  Problem-specific:  No problem-specific Assessment & Plan notes found for this encounter.  Ms. MAEGHEN GENT has a current medication list which includes the following long-term medication(s): albuterol, amphetamine-dextroamphetamine, atorvastatin, calcium-magnesium-vitamin d, famotidine, fluticasone, hydrocodone-acetaminophen, metformin, montelukast, and spironolactone.  Pharmacotherapy (Medications Ordered): No orders of the defined types were placed in this encounter.  Orders:  No orders of the defined types were placed in this encounter.  Follow-up plan:   No follow-ups on file.      Interventional Therapies  Risk Factors  Considerations:     Planned  Pending:      Under consideration:   Diagnostic bilateral lumbar facet block  Possible bilateral lumbar facet RFA.  Diagnostic left L3-4 LESI  Diagnostic left L3 TFESI  Diagnostic left L4 TFESI  Diagnostic bilateral L5 TFESI  Diagnostic left CESI  Diagnostic bilateral cervical facet block  Possible bilateral cervical facet RFA.    Completed:   None at this time   Completed by other providers:   None at this time   Therapeutic  Palliative (PRN) options:   None established   Pharmacotherapy  Nonopioids transferred to 12/24/2019: Skelaxin         Recent Visits Date Type Provider Dept  09/15/22 Office Visit Delano Metz, MD [facility]  Showing recent visits within past 90 days and meeting all other requirements Future Appointments Date Type Provider Dept  12/15/22 Appointment Delano Metz, MD [facility]  Showing future appointments within next 90 days and  meeting all other requirements  I discussed the assessment and treatment plan with the patient. The patient was provided an opportunity to ask questions and all were answered. The patient agreed with the plan and demonstrated an understanding of the instructions.  Patient advised to call back or seek an in-person evaluation if the symptoms or condition worsens.  Duration of encounter: *** minutes.  Total time on encounter, as per AMA guidelines included both the face-to-face and non-face-to-face time personally spent by the physician and/or other qualified health care professional(s) on the day of the encounter (includes time in activities that require the physician or other qualified health care professional and does not include time in activities normally performed by clinical staff). Physician's time may include the following activities when performed: Preparing to see the patient (e.g., pre-charting review of records, searching for previously ordered imaging, lab work, and nerve conduction tests) Review of prior analgesic pharmacotherapies. Reviewing PMP Interpreting ordered tests (e.g., lab work, imaging, nerve conduction tests) Performing post-procedure evaluations, including interpretation of diagnostic procedures Obtaining and/or reviewing separately obtained history Performing a medically appropriate examination and/or evaluation Counseling and educating the patient/family/caregiver Ordering medications, tests, or procedures Referring and communicating with other health care professionals (when not separately reported) Documenting clinical information in the electronic or other health record Independently interpreting results (not separately reported) and communicating results to the patient/ family/caregiver Care coordination (not separately reported)  Note by: Oswaldo Done, MD Date: 12/15/2022; Time: 9:35 AM

## 2022-12-15 ENCOUNTER — Ambulatory Visit (HOSPITAL_BASED_OUTPATIENT_CLINIC_OR_DEPARTMENT_OTHER): Payer: Managed Care, Other (non HMO) | Admitting: Pain Medicine

## 2022-12-15 DIAGNOSIS — G8929 Other chronic pain: Secondary | ICD-10-CM

## 2022-12-15 DIAGNOSIS — Z91199 Patient's noncompliance with other medical treatment and regimen due to unspecified reason: Secondary | ICD-10-CM

## 2022-12-15 DIAGNOSIS — M545 Low back pain, unspecified: Secondary | ICD-10-CM

## 2022-12-15 DIAGNOSIS — Z79891 Long term (current) use of opiate analgesic: Secondary | ICD-10-CM

## 2022-12-15 DIAGNOSIS — M7918 Myalgia, other site: Secondary | ICD-10-CM

## 2022-12-15 DIAGNOSIS — Z79899 Other long term (current) drug therapy: Secondary | ICD-10-CM

## 2022-12-15 DIAGNOSIS — G894 Chronic pain syndrome: Secondary | ICD-10-CM

## 2022-12-15 DIAGNOSIS — M47816 Spondylosis without myelopathy or radiculopathy, lumbar region: Secondary | ICD-10-CM

## 2022-12-15 DIAGNOSIS — M47812 Spondylosis without myelopathy or radiculopathy, cervical region: Secondary | ICD-10-CM

## 2022-12-27 ENCOUNTER — Other Ambulatory Visit: Payer: Self-pay

## 2022-12-27 DIAGNOSIS — G894 Chronic pain syndrome: Secondary | ICD-10-CM

## 2022-12-27 DIAGNOSIS — M7918 Myalgia, other site: Secondary | ICD-10-CM

## 2022-12-27 MED ORDER — IBUPROFEN 800 MG PO TABS: 800.0000 mg | ORAL_TABLET | Freq: Two times a day (BID) | ORAL | 0 refills | Status: DC | PRN

## 2022-12-31 ENCOUNTER — Ambulatory Visit: Payer: Managed Care, Other (non HMO) | Admitting: Primary Care

## 2023-01-03 ENCOUNTER — Ambulatory Visit: Payer: Managed Care, Other (non HMO) | Admitting: Primary Care

## 2023-01-11 ENCOUNTER — Telehealth: Payer: Self-pay | Admitting: *Deleted

## 2023-01-12 ENCOUNTER — Encounter: Payer: Self-pay | Admitting: Primary Care

## 2023-01-12 ENCOUNTER — Ambulatory Visit: Payer: Managed Care, Other (non HMO) | Admitting: Primary Care

## 2023-01-12 VITALS — BP 146/82 | HR 100 | Temp 96.8°F | Ht 64.0 in | Wt 242.0 lb

## 2023-01-12 DIAGNOSIS — G894 Chronic pain syndrome: Secondary | ICD-10-CM

## 2023-01-12 DIAGNOSIS — J4520 Mild intermittent asthma, uncomplicated: Secondary | ICD-10-CM

## 2023-01-12 DIAGNOSIS — Z0001 Encounter for general adult medical examination with abnormal findings: Secondary | ICD-10-CM | POA: Diagnosis not present

## 2023-01-12 DIAGNOSIS — M7918 Myalgia, other site: Secondary | ICD-10-CM

## 2023-01-12 DIAGNOSIS — Z0000 Encounter for general adult medical examination without abnormal findings: Secondary | ICD-10-CM

## 2023-01-12 DIAGNOSIS — K219 Gastro-esophageal reflux disease without esophagitis: Secondary | ICD-10-CM

## 2023-01-12 DIAGNOSIS — L680 Hirsutism: Secondary | ICD-10-CM

## 2023-01-12 DIAGNOSIS — R7303 Prediabetes: Secondary | ICD-10-CM

## 2023-01-12 DIAGNOSIS — F50819 Binge eating disorder, unspecified: Secondary | ICD-10-CM

## 2023-01-12 DIAGNOSIS — Z1211 Encounter for screening for malignant neoplasm of colon: Secondary | ICD-10-CM

## 2023-01-12 DIAGNOSIS — J309 Allergic rhinitis, unspecified: Secondary | ICD-10-CM

## 2023-01-12 DIAGNOSIS — F411 Generalized anxiety disorder: Secondary | ICD-10-CM

## 2023-01-12 LAB — COMPREHENSIVE METABOLIC PANEL
ALT: 15 U/L (ref 0–35)
AST: 14 U/L (ref 0–37)
Albumin: 4.2 g/dL (ref 3.5–5.2)
Alkaline Phosphatase: 113 U/L (ref 39–117)
BUN: 14 mg/dL (ref 6–23)
CO2: 30 meq/L (ref 19–32)
Calcium: 8.6 mg/dL (ref 8.4–10.5)
Chloride: 105 meq/L (ref 96–112)
Creatinine, Ser: 0.79 mg/dL (ref 0.40–1.20)
GFR: 83.59 mL/min (ref >60.00)
Glucose, Bld: 110 mg/dL — ABNORMAL HIGH (ref 70–99)
Potassium: 5 meq/L (ref 3.5–5.1)
Sodium: 139 meq/L (ref 135–145)
Total Bilirubin: 0.4 mg/dL (ref 0.2–1.2)
Total Protein: 6.8 g/dL (ref 6.0–8.3)

## 2023-01-12 LAB — LIPID PANEL
Cholesterol: 132 mg/dL (ref 0–200)
HDL: 48.4 mg/dL (ref >39.00)
LDL Cholesterol: 72 mg/dL (ref 0–99)
NonHDL: 83.45
Total CHOL/HDL Ratio: 3
Triglycerides: 58 mg/dL (ref 0.0–149.0)
VLDL: 11.6 mg/dL (ref 0.0–40.0)

## 2023-01-12 LAB — HEMOGLOBIN A1C: Hgb A1c MFr Bld: 6.2 % (ref 4.6–6.5)

## 2023-01-12 MED ORDER — MONTELUKAST SODIUM 10 MG PO TABS: 10.0000 mg | ORAL_TABLET | Freq: Every day | ORAL | 3 refills | Status: DC

## 2023-01-12 NOTE — Assessment & Plan Note (Signed)
Stable.   Continue Singulair 10 mg HS.

## 2023-01-12 NOTE — Assessment & Plan Note (Signed)
Stable. Following with psychiatry.  Continue bupropion XL 150 mg daily, Lamictal 300 mg HS, lorazepam 0.5-1 mg PRN, paroxetine CR 75 mg daily.

## 2023-01-12 NOTE — Assessment & Plan Note (Signed)
Declines tetanus and shingrix vaccines.  Mammogram UTD. Pap smear UTD, follows with GYN Colonoscopy overdue, referral placed to GI.  Discussed the importance of a healthy diet and regular exercise in order for weight loss, and to reduce the risk of further co-morbidity.  Exam stable. Labs pending.  Follow up in 1 year for repeat physical.

## 2023-01-12 NOTE — Assessment & Plan Note (Signed)
Improved.  Following with psychiatry.  Continue Vyvanse 40 mg daily.

## 2023-01-12 NOTE — Assessment & Plan Note (Signed)
Stable.  Continue Skelaxin 800 mg 1-2 times daily PRN.

## 2023-01-12 NOTE — Patient Instructions (Addendum)
Stop by the lab prior to leaving today. I will notify you of your results once received.   You will either be contacted via phone regarding your referral to GI for the colonoscopy, or you may receive a letter on your MyChart portal from our referral team with instructions for scheduling an appointment. Please let us know if you have not been contacted by anyone within two weeks.  It was a pleasure to see you today!   

## 2023-01-12 NOTE — Progress Notes (Signed)
Subjective:    Patient ID: Debra Myers, female    DOB: [birth year], 57 y.o.   MRN: [id number]  HPI  Debra Myers is a very pleasant 57 y.o. female with a history of hypertension, chronic pain syndrome, chronic back pain, TIA, binge eating disorder, GAD, hyperlipidemia, prediabetes who presents today for complete physical and follow up of chronic conditions.  Immunizations: -Tetanus: Completed in 2008 -Influenza: Completed this season   -Shingles: Completed 1 shingles vaccine, declines today.   Diet: Fair diet.  Exercise: No regular exercise.  Eye exam: Completes annually  Dental exam: Completes semi-annually    Pap Smear:  UTD, follows with GYN Mammogram: May 2024   Colonoscopy: Never completed.     BP Readings from Last 3 Encounters:  01/12/23 (!) 144/72  09/15/22 137/70  06/16/22 129/86      Review of Systems  Constitutional:  Negative for unexpected weight change.  HENT:  Negative for rhinorrhea.   Respiratory:  Negative for cough and shortness of breath.   Cardiovascular:  Negative for chest pain.  Gastrointestinal:  Negative for constipation and diarrhea.  Genitourinary:  Negative for difficulty urinating.  Musculoskeletal:  Positive for arthralgias and myalgias.  Skin:  Negative for rash.  Allergic/Immunologic: Negative for environmental allergies.  Neurological:  Negative for dizziness and headaches.  Psychiatric/Behavioral:  The patient is not nervous/anxious.          Past Medical History:  Diagnosis Date   Bulging lumbar disc (L3-4) 02/26/2015   Chronic pain syndrome    followed by pain clinic;    back, neck, lower extremities   DDD (degenerative disc disease), cervical    DDD (degenerative disc disease), lumbar    GAD (generalized anxiety disorder)    GERD (gastroesophageal reflux disease)    History of transient ischemic attack (TIA) 06/11/2015   admission in epic--  (02-10-2022 per pt no resiudal's and no s&s since)   Hypertension     MDD (major depressive disorder)    Mild intermittent asthma    followed  by pcp   PCOS (polycystic ovarian syndrome)    Pre-diabetes    Retained intrauterine contraceptive device (IUD)    Seasonal allergic rhinitis    Wears contact lenses     Social History   Socioeconomic History   Marital status: Married    Spouse name: Not on file   Number of children: Not on file   Years of education: Not on file   Highest education level: Not on file  Occupational History   Not on file  Tobacco Use   Smoking status: Never   Smokeless tobacco: Never  Vaping Use   Vaping status: Never Used  Substance and Sexual Activity   Alcohol use: Never   Drug use: Never   Sexual activity: Yes    Comment: 1st intercourse 57 yo-Fewer than 5 partners-Mirena  inserted 09-2014  Other Topics Concern   Not on file  Social History Narrative   Not on file   Social Determinants of Health   Financial Resource Strain: Not on file  Food Insecurity: Not on file  Transportation Needs: Not on file  Physical Activity: Not on file  Stress: Not on file  Social Connections: Not on file  Intimate Partner Violence: Not on file    Past Surgical History:  Procedure Laterality Date   CHOLECYSTECTOMY, LAPAROSCOPIC  11/22/1999   @[facility]  by dr d. Magnus Ivan   DILATATION & CURETTAGE/HYSTEROSCOPY WITH MYOSURE N/A 09/10/2014   Procedure: DILATATION & CURETTAGE/HYSTEROSCOPY  WITH MYOSURE/INSERTION OF IUD;  Surgeon: Ok Edwards, MD;  Location: [facility];  Service: Gynecology;  Laterality: N/A;   HYSTEROSCOPY N/A 02/11/2022   Procedure: HYSTEROSCOPY WITH MYOSURE RESECTION OF ENDOMETRIAL POLYP, DILATION AND CURETTAGE;  Surgeon: Patton Salles, MD;  Location: [facility];  Service: Gynecology;  Laterality: N/A;   IUD REMOVAL N/A 02/11/2022   Procedure: INTRAUTERINE DEVICE (IUD) REMOVAL;  Surgeon: Patton Salles, MD;  Location: [facility];  Service: Gynecology;  Laterality:  N/A;   LAPAROSCOPIC GASTRIC BANDING  04/2012   WISDOM TOOTH EXTRACTION      Family History  Problem Relation Age of Onset   Cancer Mother        thyroid cancer   Hypertension Mother    Hypertension Father    Diabetes Father    Asthma Maternal Grandmother    Stroke Maternal Grandmother    Stroke Maternal Grandfather     Allergies  Allergen Reactions   Oxycodone Itching   Penicillins Other (See Comments)    Reaction:  GI upset  Has patient had a PCN reaction causing immediate rash, facial/tongue/throat swelling, SOB or lightheadedness with hypotension: No Has patient had a PCN reaction causing severe rash involving mucus membranes or skin necrosis: No Has patient had a PCN reaction that required hospitalization No Has patient had a PCN reaction occurring within the last 10 years: No If all of the above answers are "NO", then may proceed with Cephalosporin use.   Red Dye #40 (Allura Red)     Numb tongue   Sulfa Antibiotics Itching and Rash    Current Outpatient Medications on File Prior to Visit  Medication Sig Dispense Refill   albuterol (VENTOLIN HFA) 108 (90 Base) MCG/ACT inhaler Inhale 2 puffs into the lungs every 6 (six) hours as needed for wheezing or shortness of breath. (Patient taking differently: Inhale 2 puffs into the lungs every 6 (six) hours as needed for wheezing or shortness of breath.) 18 g 0   atorvastatin (LIPITOR) 10 MG tablet TAKE 1 TABLET(10 MG) BY MOUTH DAILY FOR CHOLESTEROL (Patient taking differently: Take 10 mg by mouth daily. TAKE 1 TABLET(10 MG) BY MOUTH DAILY for cholesterol.) 90 tablet 3   buPROPion (WELLBUTRIN XL) 300 MG 24 hr tablet Take 300 mg by mouth daily.     cetirizine (ZYRTEC) 10 MG tablet Take 10 mg by mouth at bedtime.     famotidine (PEPCID AC) 10 MG tablet Take 10 mg by mouth as needed for heartburn or indigestion.     fluticasone (FLONASE) 50 MCG/ACT nasal spray SHAKE LIQUID AND USE 1 SPRAY IN EACH NOSTRIL TWICE DAILY AS NEEDED FOR  ALLERGIES OR RHINITIS (Patient taking differently: Place 1 spray into both nostrils 2 (two) times daily as needed.) 16 g 3   hydrocortisone cream 0.5 % Apply 1 Application topically as needed for itching.     ibuprofen (ADVIL) 800 MG tablet Take 1 tablet (800 mg total) by mouth 2 (two) times daily as needed for moderate pain (pain score 4-6). 60 tablet 0   lamoTRIgine (LAMICTAL) 200 MG tablet Take 300 mg by mouth at bedtime.      lisdexamfetamine (VYVANSE) 40 MG capsule Take 40 mg by mouth every morning.     LORazepam (ATIVAN) 1 MG tablet Take 0.5-1 mg by mouth every 8 (eight) hours as needed for anxiety or sleep. Per pt takes one nightly regularly     metaxalone (SKELAXIN) 800 MG tablet TAKE 1  TABLET(800 MG) BY MOUTH TWICE DAILY AS NEEDED FOR MUSCLE SPASMS 180 tablet 0   metFORMIN (GLUCOPHAGE-XR) 500 MG 24 hr tablet TAKE 1 TABLET(500 MG) BY MOUTH DAILY WITH BREAKFAST (Patient taking differently: Take 500 mg by mouth daily with breakfast.) 90 tablet 3   naloxone (NARCAN) nasal spray 4 mg/0.1 mL Place 1 spray into the nose as needed for up to 365 doses (for opioid-induced respiratory depresssion). In case of emergency (overdose), spray once into each nostril. If no response within 3 minutes, repeat application and call 911. 1 each 0   PARoxetine (PAXIL-CR) 37.5 MG 24 hr tablet Take 2 tablets by mouth at bedtime.     RA ASPIRIN EC ADULT LOW ST 81 MG EC tablet take 1 tablet by mouth once daily (Patient taking differently: Take 81 mg by mouth daily.) 30 tablet 0   spironolactone (ALDACTONE) 25 MG tablet TAKE 1 TABLET(25 MG) BY MOUTH DAILY (Patient taking differently: Take 25 mg by mouth daily. TAKE 1 TABLET(25 MG) BY MOUTH DAILY) 90 tablet 3   CALCIUM-MAGNESIUM-VITAMIN D ER PO Take 2 tablets by mouth daily. (Patient not taking: Reported on 01/12/2023)     HYDROcodone-acetaminophen (NORCO/VICODIN) 5-325 MG tablet Take 0.5-1 tablets by mouth 2 (two) times daily as needed for severe pain. Must last 30 days 60  tablet 0   No current facility-administered medications on file prior to visit.    BP (!) 144/72   Pulse 100   Temp (!) 96.8 F (36 C) (Temporal)   Ht 5\' 4"  (1.626 m)   Wt 242 lb (109.8 kg)   SpO2 95%   BMI 41.54 kg/m  Objective:   Physical Exam HENT:     Right Ear: Tympanic membrane and ear canal normal.     Left Ear: Tympanic membrane and ear canal normal.  Eyes:     Pupils: Pupils are equal, round, and reactive to light.  Cardiovascular:     Rate and Rhythm: Normal rate and regular rhythm.  Pulmonary:     Effort: Pulmonary effort is normal.     Breath sounds: Normal breath sounds.  Abdominal:     General: Bowel sounds are normal.     Palpations: Abdomen is soft.     Tenderness: There is no abdominal tenderness.  Musculoskeletal:        General: Normal range of motion.     Cervical back: Neck supple.  Skin:    General: Skin is warm and dry.  Neurological:     Mental Status: She is alert and oriented to person, place, and time.     Cranial Nerves: No cranial nerve deficit.     Deep Tendon Reflexes:     Reflex Scores:      Patellar reflexes are 2+ on the right side and 2+ on the left side. Psychiatric:        Mood and Affect: Mood normal.           Assessment & Plan:  Preventative health care Assessment & Plan: Declines tetanus and shingrix vaccines.  Mammogram UTD. Pap smear UTD, follows with GYN Colonoscopy overdue, referral placed to GI.  Discussed the importance of a healthy diet and regular exercise in order for weight loss, and to reduce the risk of further co-morbidity.  Exam stable. Labs pending.  Follow up in 1 year for repeat physical.    Screening for colon cancer -     Ambulatory referral to Gastroenterology  Chronic pain syndrome Assessment & Plan: Recently dismissed from pain  managed from pain management due to missed appointments. She is looking for a new pain management team.  Continue ibuprofen 800 mg BID, Skelaxin 800 mg  1-2 times daily.     Generalized anxiety disorder Assessment & Plan: Stable. Following with psychiatry.  Continue bupropion XL 150 mg daily, Lamictal 300 mg HS, lorazepam 0.5-1 mg PRN, paroxetine CR 75 mg daily.    Binge eating disorder, unspecified severity Assessment & Plan: Improved.  Following with psychiatry.  Continue Vyvanse 40 mg daily.   Prediabetes Assessment & Plan: Repeat A1C pending.  Continue metformin ER 500 mg daily  Orders: -     Lipid panel -     Comprehensive metabolic panel -     Hemoglobin A1c  Musculoskeletal pain Assessment & Plan: Stable.  Continue Skelaxin 800 mg 1-2 times daily PRN.   Gastroesophageal reflux disease, unspecified whether esophagitis present Assessment & Plan: Controlled.  Continue famotidine 10 mg PRN.   Mild intermittent asthma without complication Assessment & Plan: Stable.  Continue albuterol inhaler PRN and Singulair 10 mg HS.   Allergic rhinitis, unspecified seasonality, unspecified trigger Assessment & Plan: Stable.   Continue Singulair 10 mg HS.  Orders: -     Montelukast Sodium; Take 1 tablet (10 mg total) by mouth at bedtime. For allergies/asthma  Dispense: 90 tablet; Refill: 3  Hirsutism Assessment & Plan: Controlled.  Continue spironolactone 25 mg daily.          Doreene Nest, NP

## 2023-01-12 NOTE — Assessment & Plan Note (Signed)
Controlled.  Continue famotidine 10 mg PRN.

## 2023-01-12 NOTE — Assessment & Plan Note (Addendum)
Repeat A1C pending.  Continue metformin ER 500 mg daily

## 2023-01-12 NOTE — Assessment & Plan Note (Signed)
Recently dismissed from pain managed from pain management due to missed appointments. She is looking for a new pain management team.  Continue ibuprofen 800 mg BID, Skelaxin 800 mg 1-2 times daily.

## 2023-01-12 NOTE — Assessment & Plan Note (Signed)
Stable.  Continue albuterol inhaler PRN and Singulair 10 mg HS.

## 2023-01-12 NOTE — Assessment & Plan Note (Signed)
Controlled.  Continue spironolactone 25 mg daily.

## 2023-01-13 ENCOUNTER — Encounter: Payer: Self-pay | Admitting: Anesthesiology

## 2023-01-13 ENCOUNTER — Ambulatory Visit: Payer: Managed Care, Other (non HMO) | Attending: Anesthesiology | Admitting: Anesthesiology

## 2023-01-13 VITALS — BP 143/83 | HR 94 | Temp 97.4°F | Resp 18 | Ht 64.0 in | Wt 233.0 lb

## 2023-01-13 DIAGNOSIS — M79605 Pain in left leg: Secondary | ICD-10-CM | POA: Diagnosis present

## 2023-01-13 DIAGNOSIS — M542 Cervicalgia: Secondary | ICD-10-CM | POA: Insufficient documentation

## 2023-01-13 DIAGNOSIS — M47816 Spondylosis without myelopathy or radiculopathy, lumbar region: Secondary | ICD-10-CM | POA: Diagnosis present

## 2023-01-13 DIAGNOSIS — G8929 Other chronic pain: Secondary | ICD-10-CM | POA: Insufficient documentation

## 2023-01-13 DIAGNOSIS — M79604 Pain in right leg: Secondary | ICD-10-CM

## 2023-01-13 DIAGNOSIS — M47812 Spondylosis without myelopathy or radiculopathy, cervical region: Secondary | ICD-10-CM | POA: Insufficient documentation

## 2023-01-13 DIAGNOSIS — M5412 Radiculopathy, cervical region: Secondary | ICD-10-CM | POA: Insufficient documentation

## 2023-01-13 DIAGNOSIS — M7918 Myalgia, other site: Secondary | ICD-10-CM

## 2023-01-13 DIAGNOSIS — Z79891 Long term (current) use of opiate analgesic: Secondary | ICD-10-CM | POA: Diagnosis present

## 2023-01-13 DIAGNOSIS — M4722 Other spondylosis with radiculopathy, cervical region: Secondary | ICD-10-CM

## 2023-01-13 DIAGNOSIS — Z79899 Other long term (current) drug therapy: Secondary | ICD-10-CM

## 2023-01-13 DIAGNOSIS — G894 Chronic pain syndrome: Secondary | ICD-10-CM

## 2023-01-13 DIAGNOSIS — M545 Low back pain, unspecified: Secondary | ICD-10-CM | POA: Diagnosis present

## 2023-01-13 MED ORDER — HYDROCODONE-ACETAMINOPHEN 5-325 MG PO TABS: 0.5000 | ORAL_TABLET | Freq: Two times a day (BID) | ORAL | 0 refills | Status: DC | PRN

## 2023-01-13 NOTE — Progress Notes (Signed)
Nursing Pain Medication Assessment:  Safety precautions to be maintained throughout the outpatient stay will include: orient to surroundings, keep bed in low position, maintain call bell within reach at all times, provide assistance with transfer out of bed and ambulation.  Medication Inspection Compliance: Pill count conducted under aseptic conditions, in front of the patient. Neither the pills nor the bottle was removed from the patient's sight at any time. Once count was completed pills were immediately returned to the patient in their original bottle.  Medication: Hydrocodone/APAP Pill/Patch Count:  3.5 of 60 pills remain Pill/Patch Appearance: Markings consistent with prescribed medication Bottle Appearance: Standard pharmacy container. Clearly labeled. Filled Date: 63 / 16 / 2024 Last Medication intake:  Yesterday Safety precautions to be maintained throughout the outpatient stay will include: orient to surroundings, keep bed in low position, maintain call bell within reach at all times, provide assistance with transfer out of bed and ambulation.

## 2023-01-13 NOTE — Progress Notes (Signed)
Subjective:  Patient ID: Debra Myers, female    DOB: 09/30/1965  Age: 57 y.o. MRN: [id number]  CC: Back Pain (Right, lower, sometimes in left)     PROCEDURE: None  HPI Debra Myers presents for a new patient evaluation.  In the past she has been seen by Dr. Delano Myers.  She has a history of chronic neck and low back pain.  This has been present for greater than 15 to 20 years.  The pain she describes as a gnawing aching intermittent neck and low back pain.  She occasionally gets some numbness and tingling affecting the upper extremities but this is rare and generally is centralized neck pain worse with certain activities and position.  She uses an orthotic pillow at night and generally has decent control of this pain if she uses hydrocodone 1 or 2 tablets/day which she has been doing chronically for many years.  Additionally she has chronic low back pain and sees a chiropractor intermittently when she has flareups for this.  This pain can be incapacitating and limiting and restricting on her activity.  She tries to do home physical therapy exercises and this helps to some extent however she frequently has breakthrough pain throughout the day and is limited by this.  For this, the hydrocodone additionally adds benefit.  She is restricted on anti-inflammatory secondary to some gastritis and history of peptic ulcer disease and therefore is more reliant on the hydrocodone.  Furthermore, she has some anxiety and maintains that she uses half of an Ativan once or twice a week at bedtime to help with anxiety as she cares for her mother who is sickly.  No change in lower extremity strength function bowel or bladder function has noted.  Furthermore, she does well with the hydrocodone with no side effects reported.  History Debra Myers has a past medical history of Bulging lumbar disc (L3-4) (02/26/2015), Chronic pain syndrome, DDD (degenerative disc disease), cervical, DDD (degenerative disc disease),  lumbar, GAD (generalized anxiety disorder), GERD (gastroesophageal reflux disease), History of transient ischemic attack (TIA) (06/11/2015), Hypertension, MDD (major depressive disorder), Mild intermittent asthma, PCOS (polycystic ovarian syndrome), Pre-diabetes, Retained intrauterine contraceptive device (IUD), Seasonal allergic rhinitis, and Wears contact lenses.   She has a past surgical history that includes Cholecystectomy, laparoscopic (11/22/1999); Wisdom tooth extraction; Dilatation & curettage/hysteroscopy with myosure (N/A, 09/10/2014); Laparoscopic gastric banding (04/2012); Hysteroscopy (N/A, 02/11/2022); and IUD removal (N/A, 02/11/2022).   Her family history includes Asthma in her maternal grandmother; Cancer in her mother; Diabetes in her father; Hypertension in her father and mother; Stroke in her maternal grandfather and maternal grandmother.She reports that she has never smoked. She has never used smokeless tobacco. She reports that she does not drink alcohol and does not use drugs.  No valid procedures specified.  [facility] Select 13  Date Value Ref Range Status  08/05/2015 FINAL  Final    Comment:    ==================================================================== TOX[facility]SU[facility] SELECT 13 (MW) ==================================================================== Test                             Result       Flag       Units Drug Present and Declared for Prescription Verification   Lorazepam                      925          EXPECTED   ng/mg creat  Source of lorazepam is a scheduled prescription medication.   Hydrocodone                    433          EXPECTED   ng/mg creat   Norhydrocodone                 871          EXPECTED   ng/mg creat    Sources of hydrocodone include scheduled prescription    medications. Norhydrocodone is an expected metabolite of    hydrocodone. ==================================================================== Test                      Result     Flag   Units      Ref Range   Creatinine              52               mg/dL      >=44 ==================================================================== Declared Medications:  The flagging and interpretation on this report are based on the  following declared medications.  Unexpected results may arise from  inaccuracies in the declared medications.  **Note: The testing scope of this panel includes these medications:  Hydrocodone (Hydrocodone-Acetaminophen)  Lorazepam (Ativan)  **Note: The testing scope of this panel does not include following  reported medications:  Acetaminophen (Hydrocodone-Acetaminophen)  Albuterol  Aspirin  Atorvastatin  Bupropion (Wellbutrin)  Cetirizine  Fluticasone (Flonase)  Ibuprofen  Lamotrigine  Metaxalone (Skelaxin)  Metformin  Montelukast (Singulair)  Omeprazole  Paroxetine  Spironolactone (Aldactone) ==================================================================== For clinical consultation, please call [phone]. ====================================================================     Outpatient Medications Prior to Visit  Medication Sig Dispense Refill   albuterol (VENTOLIN HFA) 108 (90 Base) MCG/ACT inhaler Inhale 2 puffs into the lungs every 6 (six) hours as needed for wheezing or shortness of breath. (Patient taking differently: Inhale 2 puffs into the lungs every 6 (six) hours as needed for wheezing or shortness of breath.) 18 g 0   atorvastatin (LIPITOR) 10 MG tablet TAKE 1 TABLET(10 MG) BY MOUTH DAILY FOR CHOLESTEROL (Patient taking differently: Take 10 mg by mouth daily. TAKE 1 TABLET(10 MG) BY MOUTH DAILY for cholesterol.) 90 tablet 3   buPROPion (WELLBUTRIN XL) 300 MG 24 hr tablet Take 150 mg by mouth daily.     cetirizine (ZYRTEC) 10 MG tablet Take 10 mg by mouth at bedtime.     famotidine (PEPCID AC) 10 MG tablet Take 10 mg by mouth as needed for heartburn or indigestion.     fluticasone (FLONASE) 50 MCG/ACT nasal spray  SHAKE LIQUID AND USE 1 SPRAY IN EACH NOSTRIL TWICE DAILY AS NEEDED FOR ALLERGIES OR RHINITIS (Patient taking differently: Place 1 spray into both nostrils 2 (two) times daily as needed.) 16 g 3   hydrocortisone cream 0.5 % Apply 1 Application topically as needed for itching.     ibuprofen (ADVIL) 800 MG tablet Take 1 tablet (800 mg total) by mouth 2 (two) times daily as needed for moderate pain (pain score 4-6). 60 tablet 0   lamoTRIgine (LAMICTAL) 200 MG tablet Take 300 mg by mouth at bedtime.      lisdexamfetamine (VYVANSE) 40 MG capsule Take 40 mg by mouth every morning.     LORazepam (ATIVAN) 1 MG tablet Take 0.5-1 mg by mouth at bedtime. Per pt takes one nightly regularly     metaxalone (SKELAXIN) 800 MG tablet TAKE 1 TABLET(800  MG) BY MOUTH TWICE DAILY AS NEEDED FOR MUSCLE SPASMS 180 tablet 0   metFORMIN (GLUCOPHAGE-XR) 500 MG 24 hr tablet TAKE 1 TABLET(500 MG) BY MOUTH DAILY WITH BREAKFAST (Patient taking differently: Take 500 mg by mouth daily with breakfast.) 90 tablet 3   montelukast (SINGULAIR) 10 MG tablet Take 1 tablet (10 mg total) by mouth at bedtime. For allergies/asthma 90 tablet 3   naloxone (NARCAN) nasal spray 4 mg/0.1 mL Place 1 spray into the nose as needed for up to 365 doses (for opioid-induced respiratory depresssion). In case of emergency (overdose), spray once into each nostril. If no response within 3 minutes, repeat application and call 911. 1 each 0   PARoxetine (PAXIL-CR) 37.5 MG 24 hr tablet Take 2 tablets by mouth at bedtime.     RA ASPIRIN EC ADULT LOW ST 81 MG EC tablet take 1 tablet by mouth once daily (Patient taking differently: Take 81 mg by mouth daily.) 30 tablet 0   spironolactone (ALDACTONE) 25 MG tablet TAKE 1 TABLET(25 MG) BY MOUTH DAILY (Patient taking differently: Take 25 mg by mouth daily. TAKE 1 TABLET(25 MG) BY MOUTH DAILY) 90 tablet 3   CALCIUM-MAGNESIUM-VITAMIN D ER PO Take 2 tablets by mouth daily. (Patient not taking: Reported on 01/12/2023)      HYDROcodone-acetaminophen (NORCO/VICODIN) 5-325 MG tablet Take 0.5-1 tablets by mouth 2 (two) times daily as needed for severe pain. Must last 30 days 60 tablet 0   No facility-administered medications prior to visit.   Lab Results  Component Value Date   WBC 8.2 02/11/2022   HGB 13.0 02/11/2022   HCT 39.3 02/11/2022   PLT 226 02/11/2022   GLUCOSE 110 (H) 01/12/2023   CHOL 132 01/12/2023   TRIG 58.0 01/12/2023   HDL 48.40 01/12/2023   LDLCALC 72 01/12/2023   ALT 15 01/12/2023   AST 14 01/12/2023   NA 139 01/12/2023   K 5.0 01/12/2023   CL 105 01/12/2023   CREATININE 0.79 01/12/2023   BUN 14 01/12/2023   CO2 30 01/12/2023   TSH 0.57 01/21/2021   INR 1.10 06/11/2015   HGBA1C 6.2 01/12/2023    --------------------------------------------------------------------------------------------------------------------- No results found.     ---------------------------------------------------------------------------------------------------------------------- Past Medical History:  Diagnosis Date   Bulging lumbar disc (L3-4) 02/26/2015   Chronic pain syndrome    followed by pain clinic;    back, neck, lower extremities   DDD (degenerative disc disease), cervical    DDD (degenerative disc disease), lumbar    GAD (generalized anxiety disorder)    GERD (gastroesophageal reflux disease)    History of transient ischemic attack (TIA) 06/11/2015   admission in epic--  (02-10-2022 per pt no resiudal's and no s&s since)   Hypertension    MDD (major depressive disorder)    Mild intermittent asthma    followed  by pcp   PCOS (polycystic ovarian syndrome)    Pre-diabetes    Retained intrauterine contraceptive device (IUD)    Seasonal allergic rhinitis    Wears contact lenses     Past Surgical History:  Procedure Laterality Date   CHOLECYSTECTOMY, LAPAROSCOPIC  11/22/1999   @[facility]  by dr d. Magnus Ivan   DILATATION & CURETTAGE/HYSTEROSCOPY WITH MYOSURE N/A 09/10/2014   Procedure:  DILATATION & CURETTAGE/HYSTEROSCOPY WITH MYOSURE/INSERTION OF IUD;  Surgeon: Ok Edwards, MD;  Location: [facility];  Service: Gynecology;  Laterality: N/A;   HYSTEROSCOPY N/A 02/11/2022   Procedure: HYSTEROSCOPY WITH MYOSURE RESECTION OF ENDOMETRIAL POLYP, DILATION AND CURETTAGE;  Surgeon: Ardell Isaacs, Debbe Bales  E, MD;  Location: [facility];  Service: Gynecology;  Laterality: N/A;   IUD REMOVAL N/A 02/11/2022   Procedure: INTRAUTERINE DEVICE (IUD) REMOVAL;  Surgeon: Patton Salles, MD;  Location[facility];  Service: Gynecology;  Laterality: N/A;   LAPAROSCOPIC GASTRIC BANDING  04/2012   WISDOM TOOTH EXTRACTION      Family History  Problem Relation Age of Onset   Cancer Mother        thyroid cancer   Hypertension Mother    Hypertension Father    Diabetes Father    Asthma Maternal Grandmother    Stroke Maternal Grandmother    Stroke Maternal Grandfather     Social History   Tobacco Use   Smoking status: Never   Smokeless tobacco: Never  Substance Use Topics   Alcohol use: Never    ---------------------------------------------------------------------------------------------------------------------  Scheduled Meds: Continuous Infusions: PRN Meds:.   BP (!) 143/83   Pulse 94   Temp (!) 97.4 F (36.3 C) (Temporal)   Resp 18   Ht 5\' 4"  (1.626 m)   Wt 233 lb (105.7 kg)   SpO2 100%   BMI 39.99 kg/m    BP Readings from Last 3 Encounters:  01/13/23 (!) 143/83  01/12/23 (!) 146/82  09/15/22 137/70     Wt Readings from Last 3 Encounters:  01/13/23 233 lb (105.7 kg)  01/12/23 242 lb (109.8 kg)  09/15/22 235 lb (106.6 kg)     ----------------------------------------------------------------------------------------------------------------------  ROS Review of Systems CNS: No headaches or memory issues Cardiac: No angina or palpitations Pulmonary: No shortness of breath or wheezing: Musculoskeletal: As  above  Objective:  BP (!) 143/83   Pulse 94   Temp (!) 97.4 F (36.3 C) (Temporal)   Resp 18   Ht 5\' 4"  (1.626 m)   Wt 233 lb (105.7 kg)   SpO2 100%   BMI 39.99 kg/m   Physical Exam Patient is alert oriented cooperative compliant and a good historian. Heart is regular rate and rhythm Musculoskeletal: She goes from seated to standing without significant difficulty.  She has a slightly antalgic gait.  Balance is slightly diminished but good overall and muscle tone and bulk is good.  Strength at the ankles knees and hips appears to be 5/5 bilateral and symmetric.  With the patient standing she notes that extension and left and right lateral rotation do reproduce some of her primary pain complaint.  She has some mild tenderness in the bilateral paraspinous musculature of the lumbar region.  It is worse in the right L5-S1 region than on the left.     Assessment & Plan:   Debra Myers was seen today for back pain.  Diagnoses and all orders for this visit:  Encounter for medication management -     HYDROcodone-acetaminophen (NORCO/VICODIN) 5-325 MG tablet; Take 0.5-1 tablets by mouth 2 (two) times daily as needed for severe pain (pain score 7-10). Must last 30 days  Chronic low back pain (1ry area of Pain) (Bilateral) (L>R) -     HYDROcodone-acetaminophen (NORCO/VICODIN) 5-325 MG tablet; Take 0.5-1 tablets by mouth 2 (two) times daily as needed for severe pain (pain score 7-10). Must last 30 days  Chronic lower extremity pain (2ry area of Pain) (Left) -     HYDROcodone-acetaminophen (NORCO/VICODIN) 5-325 MG tablet; Take 0.5-1 tablets by mouth 2 (two) times daily as needed for severe pain (pain score 7-10). Must last 30 days  Chronic neck pain (3ry area of Pain) (Bilateral) (L>R) -  HYDROcodone-acetaminophen (NORCO/VICODIN) 5-325 MG tablet; Take 0.5-1 tablets by mouth 2 (two) times daily as needed for severe pain (pain score 7-10). Must last 30 days  Cervical facet syndrome (Bilateral)  (L>R) -     HYDROcodone-acetaminophen (NORCO/VICODIN) 5-325 MG tablet; Take 0.5-1 tablets by mouth 2 (two) times daily as needed for severe pain (pain score 7-10). Must last 30 days  Lumbar facet syndrome (Bilateral) (L>R) -     HYDROcodone-acetaminophen (NORCO/VICODIN) 5-325 MG tablet; Take 0.5-1 tablets by mouth 2 (two) times daily as needed for severe pain (pain score 7-10). Must last 30 days  Chronic cervical radicular pain (Bilateral) (L>R) -     HYDROcodone-acetaminophen (NORCO/VICODIN) 5-325 MG tablet; Take 0.5-1 tablets by mouth 2 (two) times daily as needed for severe pain (pain score 7-10). Must last 30 days  Musculoskeletal pain -     HYDROcodone-acetaminophen (NORCO/VICODIN) 5-325 MG tablet; Take 0.5-1 tablets by mouth 2 (two) times daily as needed for severe pain (pain score 7-10). Must last 30 days  Chronic pain syndrome -     HYDROcodone-acetaminophen (NORCO/VICODIN) 5-325 MG tablet; Take 0.5-1 tablets by mouth 2 (two) times daily as needed for severe pain (pain score 7-10). Must last 30 days  Chronic use of opiate for therapeutic purpose -     HYDROcodone-acetaminophen (NORCO/VICODIN) 5-325 MG tablet; Take 0.5-1 tablets by mouth 2 (two) times daily as needed for severe pain (pain score 7-10). Must last 30 days  Pharmacologic therapy -     HYDROcodone-acetaminophen (NORCO/VICODIN) 5-325 MG tablet; Take 0.5-1 tablets by mouth 2 (two) times daily as needed for severe pain (pain score 7-10). Must last 30 days  Encounter for chronic pain management -     HYDROcodone-acetaminophen (NORCO/VICODIN) 5-325 MG tablet; Take 0.5-1 tablets by mouth 2 (two) times daily as needed for severe pain (pain score 7-10). Must last 30 days     ----------------------------------------------------------------------------------------------------------------------  Problem List Items Addressed This Visit       Unprioritized   Cervical facet syndrome (Bilateral) (L>R) (Chronic)   Relevant  Medications   HYDROcodone-acetaminophen (NORCO/VICODIN) 5-325 MG tablet   Chronic cervical radicular pain (Bilateral) (L>R) (Chronic)   Relevant Medications   HYDROcodone-acetaminophen (NORCO/VICODIN) 5-325 MG tablet   Chronic low back pain (1ry area of Pain) (Bilateral) (L>R) (Chronic)   Relevant Medications   HYDROcodone-acetaminophen (NORCO/VICODIN) 5-325 MG tablet   Chronic lower extremity pain (2ry area of Pain) (Left) (Chronic)   Relevant Medications   HYDROcodone-acetaminophen (NORCO/VICODIN) 5-325 MG tablet   Chronic neck pain (3ry area of Pain) (Bilateral) (L>R) (Chronic)   Relevant Medications   HYDROcodone-acetaminophen (NORCO/VICODIN) 5-325 MG tablet   Chronic pain syndrome (Chronic)   Relevant Medications   HYDROcodone-acetaminophen (NORCO/VICODIN) 5-325 MG tablet   Chronic use of opiate for therapeutic purpose (Chronic)   Relevant Medications   HYDROcodone-acetaminophen (NORCO/VICODIN) 5-325 MG tablet   Lumbar facet syndrome (Bilateral) (L>R) (Chronic)   Relevant Medications   HYDROcodone-acetaminophen (NORCO/VICODIN) 5-325 MG tablet   Musculoskeletal pain (Chronic)   Relevant Medications   HYDROcodone-acetaminophen (NORCO/VICODIN) 5-325 MG tablet   Pharmacologic therapy (Chronic)   Relevant Medications   HYDROcodone-acetaminophen (NORCO/VICODIN) 5-325 MG tablet   Encounter for chronic pain management   Relevant Medications   HYDROcodone-acetaminophen (NORCO/VICODIN) 5-325 MG tablet   Other Visit Diagnoses     Encounter for medication management    -  Primary   Relevant Medications   HYDROcodone-acetaminophen (NORCO/VICODIN) 5-325 MG tablet       ----------------------------------------------------------------------------------------------------------------------  1. Encounter for medication management I think is  reasonable to reinstitute the chronic opioid therapy as she has done very well with this.  This was discontinued by Dr. Laban Emperor secondary to the  patient's failure to present for clinic evaluation.  She does mention that she has been undergoing significant stress secondary to increased responsibilities in the care of her aging mother.  She gets good relief with the medication rated about 50 to 75% improvement lasting about 5 to 6 hours when she takes the medications and she uses these sparingly.  Additionally, she knows not to use these concomitantly with any of the benzodiazepine Ativan that she is using.  She uses the Ativan once or twice a week at the 0.5 mg strength and on those days takes her hydrocodone 4 to 6 hours in advance of this.  We did talk about using ibuprofen which she says she tolerates well on the nights when she takes the Ativan so that she can skip her hydrocodone evening dose. - HYDROcodone-acetaminophen (NORCO/VICODIN) 5-325 MG tablet; Take 0.5-1 tablets by mouth 2 (two) times daily as needed for severe pain (pain score 7-10). Must last 30 days  Dispense: 60 tablet; Refill: 0  2. Chronic low back pain (1ry area of Pain) (Bilateral) (L>R) As above and continue core stretching strengthening exercises as reviewed today. - HYDROcodone-acetaminophen (NORCO/VICODIN) 5-325 MG tablet; Take 0.5-1 tablets by mouth 2 (two) times daily as needed for severe pain (pain score 7-10). Must last 30 days  Dispense: 60 tablet; Refill: 0  3. Chronic lower extremity pain (2ry area of Pain) (Left)  - HYDROcodone-acetaminophen (NORCO/VICODIN) 5-325 MG tablet; Take 0.5-1 tablets by mouth 2 (two) times daily as needed for severe pain (pain score 7-10). Must last 30 days  Dspense: 60 tablet; Refill: 0  4. Chronic neck pain (3ry area of Pain) (Bilateral) (L>R)  - HYDROcodone-acetaminophen (NORCO/VICODIN) 5-325 MG tablet; Take 0.5-1 tablets by mouth 2 (two) times daily as needed for severe pain (pain score 7-10). Must last 30 days  Dispense: 60 tablet; Refill: 0  5. Cervical facet syndrome (Bilateral) (L>R)  - HYDROcodone-acetaminophen  (NORCO/VICODIN) 5-325 MG tablet; Take 0.5-1 tablets by mouth 2 (two) times daily as needed for severe pain (pain score 7-10). Must last 30 days  Dispense: 60 tablet; Refill: 0  6. Lumbar facet syndrome (Bilateral) (L>R)  - HYDROcodone-acetaminophen (NORCO/VICODIN) 5-325 MG tablet; Take 0.5-1 tablets by mouth 2 (two) times daily as needed for severe pain (pain score 7-10). Must last 30 days  Dispense: 60 tablet; Refill: 0  7. Chronic cervical radicular pain (Bilateral) (L>R)  - HYDROcodone-acetaminophen (NORCO/VICODIN) 5-325 MG tablet; Take 0.5-1 tablets by mouth 2 (two) times daily as needed for severe pain (pain score 7-10). Must last 30 days  Dispense: 60 tablet; Refill: 0  8. Musculoskeletal pain Continue intermittent use of physical therapy and chiropractic as needed - HYDROcodone-acetaminophen (NORCO/VICODIN) 5-325 MG tablet; Take 0.5-1 tablets by mouth 2 (two) times daily as needed for severe pain (pain score 7-10). Must last 30 days  Dispense: 60 tablet; Refill: 0  9. Chronic pain syndrome I have reviewed the [facility] practitioner database information is appropriate to reinstitute opioid therapy.  We gone over the risks and benefits of chronic opioid therapy.  It appears the patient has derive good functional improvement with chronic opioid therapy compared to conservative care.  I have offered her possible epidural steroid injection should her sciatica intensify and have encouraged her to continue with exercise management weight loss and physical therapy. - HYDROcodone-acetaminophen (NORCO/VICODIN) 5-325 MG tablet; Take 0.5-1  tablets by mouth 2 (two) times daily as needed for severe pain (pain score 7-10). Must last 30 days  Dispense: 60 tablet; Refill: 0  10. Chronic use of opiate for therapeutic purpose  - HYDROcodone-acetaminophen (NORCO/VICODIN) 5-325 MG tablet; Take 0.5-1 tablets by mouth 2 (two) times daily as needed for severe pain (pain score 7-10). Must last 30 days   Dispense: 60 tablet; Refill: 0  11. Pharmacologic therapy  - HYDROcodone-acetaminophen (NORCO/VICODIN) 5-325 MG tablet; Take 0.5-1 tablets by mouth 2 (two) times daily as needed for severe pain (pain score 7-10). Must last 30 days  Dispense: 60 tablet; Refill: 0  12. Encounter for chronic pain management  - HYDROcodone-acetaminophen (NORCO/VICODIN) 5-325 MG tablet; Take 0.5-1 tablets by mouth 2 (two) times daily as needed for severe pain (pain score 7-10). Must last 30 days  Dispense: 60 tablet; Refill: 0  Continue follow-up with her primary care physicians for baseline medical care    ----------------------------------------------------------------------------------------------------------------------  I have changed Jansen D. Perz's HYDROcodone-acetaminophen. I am also having her maintain her buPROPion, lamoTRIgine, LORazepam, cetirizine, RA Aspirin EC Adult Low St, fluticasone, albuterol, PARoxetine, spironolactone, metFORMIN, atorvastatin, famotidine, hydrocortisone cream, CALCIUM-MAGNESIUM-VITAMIN D ER PO, naloxone, metaxalone, ibuprofen, lisdexamfetamine, and montelukast.   Meds ordered this encounter  Medications   HYDROcodone-acetaminophen (NORCO/VICODIN) 5-325 MG tablet    Sig: Take 0.5-1 tablets by mouth 2 (two) times daily as needed for severe pain (pain score 7-10). Must last 30 days    Dispense:  60 tablet    Refill:  0    DO NOT: delete (not duplicate); no partial-fill (will deny script to complete), no refill request (F/U required). DISPENSE: 1 day early if closed on fill date. WARN: No CNS-depressants within 8 hrs of med.       Follow-up: Return in about 1 month (around 02/12/2023) for evaluation, med refill.    Yevette Edwards, MD 9:58 AM  The [facility] practitioner database for opioid medications on this patient has been reviewed by me and my staff   Greater than 50% of the total encounter time was spent in counseling and / or coordination of care.     This dictation  was performed utilizing Conservation officer, historic buildings.  Please excuse any unintentional or mistaken typographical errors as a result.

## 2023-01-25 ENCOUNTER — Other Ambulatory Visit: Payer: Self-pay

## 2023-01-25 ENCOUNTER — Telehealth: Payer: Self-pay | Admitting: Primary Care

## 2023-01-25 DIAGNOSIS — E282 Polycystic ovarian syndrome: Secondary | ICD-10-CM

## 2023-01-25 DIAGNOSIS — J4520 Mild intermittent asthma, uncomplicated: Secondary | ICD-10-CM

## 2023-01-25 DIAGNOSIS — L680 Hirsutism: Secondary | ICD-10-CM

## 2023-01-25 DIAGNOSIS — R7303 Prediabetes: Secondary | ICD-10-CM

## 2023-01-25 NOTE — Telephone Encounter (Signed)
Prescription Request  01/25/2023  LOV: 01/12/2023  What is the name of the medication or equipment? albuterol (VENTOLIN HFA) 108 (90 Base) MCG/ACT inhaler metFORMIN (GLUCOPHAGE-XR) 500 MG 24 hr tablet spironolactone (ALDACTONE) 25 MG tablet   Have you contacted your pharmacy to request a refill? No   Which pharmacy would you like this sent to?  [id number] - [place], [place] - [address] AT [facility] [address] [place] [address] Phone: [phone] Fax: [phone]    Patient notified that their request is being sent to the clinical staff for review and that they should receive a response within 2 business days.   Please advise at Mobile [phone] (mobile)

## 2023-01-26 MED ORDER — METFORMIN HCL ER 500 MG PO TB24: 500.0000 mg | ORAL_TABLET | Freq: Every day | ORAL | 3 refills | Status: DC

## 2023-01-26 MED ORDER — ALBUTEROL SULFATE HFA 108 (90 BASE) MCG/ACT IN AERS
2.0000 | INHALATION_SPRAY | Freq: Four times a day (QID) | RESPIRATORY_TRACT | 0 refills | Status: AC | PRN
Start: 2023-01-26 — End: ?

## 2023-01-26 MED ORDER — SPIRONOLACTONE 25 MG PO TABS: ORAL_TABLET | ORAL | 3 refills | Status: DC

## 2023-01-26 NOTE — Telephone Encounter (Signed)
Refills sent to pharmacy. 

## 2023-02-01 ENCOUNTER — Other Ambulatory Visit: Payer: Self-pay

## 2023-02-01 DIAGNOSIS — M7918 Myalgia, other site: Secondary | ICD-10-CM

## 2023-02-01 DIAGNOSIS — G894 Chronic pain syndrome: Secondary | ICD-10-CM

## 2023-02-01 NOTE — Telephone Encounter (Signed)
Please call patient:  Received refill request for ibuprofen. There is an interaction between ibuprofen and her Paxil medication. Also, recurrent Ibuprofen use can also lead to ulcers in the stomach which can cause a lot of pain and bleeding.  How often is she taking the Ibuprofen? My recommendation would be to take it sparingly and alternate with Tylenol.

## 2023-02-02 MED ORDER — IBUPROFEN 800 MG PO TABS: 800.0000 mg | ORAL_TABLET | Freq: Two times a day (BID) | ORAL | 0 refills | Status: DC | PRN

## 2023-02-02 NOTE — Telephone Encounter (Signed)
Called and spoke with patient she states she takes the ibuprofen in the morning and at night for her back pain. Advised of [employer]. Patient said she would try to take it less, she will try Tylenol at night and take Ibuprofen in the morning.  She is completely out and would like a refill.

## 2023-02-02 NOTE — Telephone Encounter (Signed)
Unable to reach patient. Left voicemail to return call to our office.   

## 2023-02-02 NOTE — Telephone Encounter (Signed)
Noted  

## 2023-02-02 NOTE — Telephone Encounter (Signed)
Patient called in returning call she received. 

## 2023-02-08 ENCOUNTER — Ambulatory Visit: Payer: Managed Care, Other (non HMO) | Attending: Anesthesiology | Admitting: Anesthesiology

## 2023-02-08 ENCOUNTER — Encounter: Payer: Self-pay | Admitting: Anesthesiology

## 2023-02-08 DIAGNOSIS — M5412 Radiculopathy, cervical region: Secondary | ICD-10-CM | POA: Diagnosis not present

## 2023-02-08 DIAGNOSIS — Z79891 Long term (current) use of opiate analgesic: Secondary | ICD-10-CM

## 2023-02-08 DIAGNOSIS — Z79899 Other long term (current) drug therapy: Secondary | ICD-10-CM

## 2023-02-08 DIAGNOSIS — G8929 Other chronic pain: Secondary | ICD-10-CM

## 2023-02-08 DIAGNOSIS — M47816 Spondylosis without myelopathy or radiculopathy, lumbar region: Secondary | ICD-10-CM

## 2023-02-08 DIAGNOSIS — M79605 Pain in left leg: Secondary | ICD-10-CM | POA: Diagnosis not present

## 2023-02-08 DIAGNOSIS — G894 Chronic pain syndrome: Secondary | ICD-10-CM

## 2023-02-08 DIAGNOSIS — M47812 Spondylosis without myelopathy or radiculopathy, cervical region: Secondary | ICD-10-CM

## 2023-02-08 DIAGNOSIS — M7918 Myalgia, other site: Secondary | ICD-10-CM

## 2023-02-08 MED ORDER — HYDROCODONE-ACETAMINOPHEN 5-325 MG PO TABS: 0.5000 | ORAL_TABLET | Freq: Two times a day (BID) | ORAL | 0 refills | Status: DC | PRN

## 2023-02-08 NOTE — Progress Notes (Signed)
Virtual Visit via Telephone Note  I connected with Debra Myers on 02/08/23 at  2:40 PM EST by telephone and verified that I am speaking with the correct person using two identifiers.  Location: Patient: Home Provider: [facility] control center   I discussed the limitations, risks, security and privacy concerns of performing an evaluation and management service by telephone and the availability of in person appointments. I also discussed with the patient that there may be a patient responsible charge related to this service. The patient expressed understanding and agreed to proceed.   History of Present Illness: I spoke with Debra Myers via telephone as we are unable link for the video portion of carbs.  I last saw her about a month ago and she is currently doing fairly well with the Vicodin twice a day and trying to wean back the Ativan.  She is taking the Ativan in the morning and try and avoid the evening Ativan or if she does take the evening Ativan to avoid taking hydrocodone at nighttime.  Otherwise the pain in her low back is stable in nature with no recent changes in the quality characteristic or distribution.  It is responding favorably to the hydrocodone generally giving her 50 to 75% relief.  The opioid medications enable her to stay active functional and help take care of her mom which has been an increased demand on her recently.  No change in lower extremity strength function bowel or bladder function is noted. Review of systems: General: No fevers or chills Pulmonary: No shortness of breath or dyspnea Cardiac: No angina or palpitations or lightheadedness GI: No abdominal pain or constipation Psych: No depression     Observations/Objective:  Current Outpatient Medications:    albuterol (VENTOLIN HFA) 108 (90 Base) MCG/ACT inhaler, Inhale 2 puffs into the lungs every 6 (six) hours as needed for wheezing or shortness of breath., Disp: 18 g, Rfl: 0   atorvastatin (LIPITOR) 10 MG  tablet, TAKE 1 TABLET(10 MG) BY MOUTH DAILY FOR CHOLESTEROL (Patient taking differently: Take 10 mg by mouth daily. TAKE 1 TABLET(10 MG) BY MOUTH DAILY for cholesterol.), Disp: 90 tablet, Rfl: 3   buPROPion (WELLBUTRIN XL) 300 MG 24 hr tablet, Take 150 mg by mouth daily., Disp: , Rfl:    CALCIUM-MAGNESIUM-VITAMIN D ER PO, Take 2 tablets by mouth daily. (Patient not taking: Reported on 01/12/2023), Disp: , Rfl:    cetirizine (ZYRTEC) 10 MG tablet, Take 10 mg by mouth at bedtime., Disp: , Rfl:    famotidine (PEPCID AC) 10 MG tablet, Take 10 mg by mouth as needed for heartburn or indigestion., Disp: , Rfl:    fluticasone (FLONASE) 50 MCG/ACT nasal spray, SHAKE LIQUID AND USE 1 SPRAY IN EACH NOSTRIL TWICE DAILY AS NEEDED FOR ALLERGIES OR RHINITIS (Patient taking differently: Place 1 spray into both nostrils 2 (two) times daily as needed.), Disp: 16 g, Rfl: 3   [START ON 02/12/2023] HYDROcodone-acetaminophen (NORCO/VICODIN) 5-325 MG tablet, Take 0.5-1 tablets by mouth 2 (two) times daily as needed for severe pain (pain score 7-10). Must last 30 days, Disp: 60 tablet, Rfl: 0   hydrocortisone cream 0.5 %, Apply 1 Application topically as needed for itching., Disp: , Rfl:    ibuprofen (ADVIL) 800 MG tablet, Take 1 tablet (800 mg total) by mouth 2 (two) times daily as needed for moderate pain (pain score 4-6)., Disp: 180 tablet, Rfl: 0   lamoTRIgine (LAMICTAL) 200 MG tablet, Take 300 mg by mouth at bedtime. , Disp: ,  Rfl:    lisdexamfetamine (VYVANSE) 40 MG capsule, Take 40 mg by mouth every morning., Disp: , Rfl:    LORazepam (ATIVAN) 1 MG tablet, Take 0.5-1 mg by mouth at bedtime. Per pt takes one nightly regularly, Disp: , Rfl:    metaxalone (SKELAXIN) 800 MG tablet, TAKE 1 TABLET(800 MG) BY MOUTH TWICE DAILY AS NEEDED FOR MUSCLE SPASMS, Disp: 180 tablet, Rfl: 0   metFORMIN (GLUCOPHAGE-XR) 500 MG 24 hr tablet, Take 1 tablet (500 mg total) by mouth daily with breakfast. For blood sugar, Disp: 90 tablet, Rfl:  3   montelukast (SINGULAIR) 10 MG tablet, Take 1 tablet (10 mg total) by mouth at bedtime. For allergies/asthma, Disp: 90 tablet, Rfl: 3   naloxone (NARCAN) nasal spray 4 mg/0.1 mL, Place 1 spray into the nose as needed for up to 365 doses (for opioid-induced respiratory depresssion). In case of emergency (overdose), spray once into each nostril. If no response within 3 minutes, repeat application and call 911., Disp: 1 each, Rfl: 0   PARoxetine (PAXIL-CR) 37.5 MG 24 hr tablet, Take 2 tablets by mouth at bedtime., Disp: , Rfl:    RA ASPIRIN EC ADULT LOW ST 81 MG EC tablet, take 1 tablet by mouth once daily (Patient taking differently: Take 81 mg by mouth daily.), Disp: 30 tablet, Rfl: 0   spironolactone (ALDACTONE) 25 MG tablet, TAKE 1 TABLET(25 MG) BY MOUTH DAILY for blood pressure, Disp: 90 tablet, Rfl: 3   Past Medical History:  Diagnosis Date   Bulging lumbar disc (L3-4) 02/26/2015   Chronic pain syndrome    followed by pain clinic;    back, neck, lower extremities   DDD (degenerative disc disease), cervical    DDD (degenerative disc disease), lumbar    GAD (generalized anxiety disorder)    GERD (gastroesophageal reflux disease)    History of transient ischemic attack (TIA) 06/11/2015   admission in epic--  (02-10-2022 per pt no resiudal's and no s&s since)   Hypertension    MDD (major depressive disorder)    Mild intermittent asthma    followed  by pcp   PCOS (polycystic ovarian syndrome)    Pre-diabetes    Retained intrauterine contraceptive device (IUD)    Seasonal allergic rhinitis    Wears contact lenses    Assessment and Plan:  1. Encounter for medication management   2. Chronic low back pain (1ry area of Pain) (Bilateral) (L>R)   3. Chronic lower extremity pain (2ry area of Pain) (Left)   4. Chronic neck pain (3ry area of Pain) (Bilateral) (L>R)   5. Cervical facet syndrome (Bilateral) (L>R)   6. Lumbar facet syndrome (Bilateral) (L>R)   7. Chronic cervical radicular  pain (Bilateral) (L>R)   8. Musculoskeletal pain   9. Chronic pain syndrome   10. Chronic use of opiate for therapeutic purpose   11. Pharmacologic therapy   12. Encounter for chronic pain management    Based on our conversation it is appropriate to refill her medicines this is dated for December 21.  I have reviewed the [facility] practitioner database information and it is appropriate for refill.  Continue hydrocodone once or twice a day for pain relief.  Continue avoidance of benzo competently with opioids.  Continue with her stretching exercises as reviewed today with follow-up in 1 month.  Continue follow-up with her primary care physician for baseline medical care.  Scheduled for 1 month return Follow Up Instructions:    I discussed the assessment and treatment plan with  the patient. The patient was provided an opportunity to ask questions and all were answered. The patient agreed with the plan and demonstrated an understanding of the instructions.   The patient was advised to call back or seek an in-person evaluation if the symptoms worsen or if the condition fails to improve as anticipated.  I provided 30 minutes of non-face-to-face time during this encounter.   Yevette Edwards, MD

## 2023-02-14 ENCOUNTER — Other Ambulatory Visit: Payer: Self-pay | Admitting: Primary Care

## 2023-02-14 ENCOUNTER — Telehealth: Payer: Self-pay | Admitting: *Deleted

## 2023-02-14 DIAGNOSIS — E781 Pure hyperglyceridemia: Secondary | ICD-10-CM

## 2023-02-14 NOTE — Telephone Encounter (Signed)
Refills sent to pharmacy. 

## 2023-02-14 NOTE — Telephone Encounter (Signed)
Copied from CRM [id number]. Topic: Clinical - Medication Refill >> Feb 14, 2023 12:13 PM Louie Boston wrote: Most Recent Primary Care Visit:  Provider: Doreene Nest  Department: [facility]  Visit Type: PHYSICAL  Date: 01/12/2023  Medication: atorvastatin (LIPITOR) 10 MG tablet   Has the patient contacted their pharmacy? Yes (Agent: If no, request that the patient contact the pharmacy for the refill. If patient does not wish to contact the pharmacy document the reason why and proceed with request.) (Agent: If yes, when and what did the pharmacy advise?)  Patient is out of medication. Patient did reach out to pharmacy and was advised that she does not have a refill available.   Is this the correct pharmacy for this prescription? Yes If no, delete pharmacy and type the correct one.  This is the patient's preferred pharmacy:  [id number] - [place], [place] - [address] AT [facility] [address] [facility] [address] [place] [address] Phone: [phone] Fax: [phone]   Has the prescription been filled recently? No  Is the patient out of the medication? Yes  Has the patient been seen for an appointment in the last year OR does the patient have an upcoming appointment? Yes  Can we respond through MyChart? Yes  Agent: Please be advised that Rx refills may take up to 3 business days. We ask that you follow-up with your pharmacy.

## 2023-03-07 ENCOUNTER — Ambulatory Visit: Payer: Managed Care, Other (non HMO) | Attending: Anesthesiology | Admitting: Anesthesiology

## 2023-03-07 ENCOUNTER — Encounter: Payer: Self-pay | Admitting: Anesthesiology

## 2023-03-07 DIAGNOSIS — M545 Low back pain, unspecified: Secondary | ICD-10-CM | POA: Diagnosis not present

## 2023-03-07 DIAGNOSIS — M4722 Other spondylosis with radiculopathy, cervical region: Secondary | ICD-10-CM

## 2023-03-07 DIAGNOSIS — M79605 Pain in left leg: Secondary | ICD-10-CM | POA: Diagnosis not present

## 2023-03-07 DIAGNOSIS — Z79899 Other long term (current) drug therapy: Secondary | ICD-10-CM

## 2023-03-07 DIAGNOSIS — Z79891 Long term (current) use of opiate analgesic: Secondary | ICD-10-CM

## 2023-03-07 DIAGNOSIS — M7918 Myalgia, other site: Secondary | ICD-10-CM

## 2023-03-07 DIAGNOSIS — M62838 Other muscle spasm: Secondary | ICD-10-CM

## 2023-03-07 DIAGNOSIS — G894 Chronic pain syndrome: Secondary | ICD-10-CM

## 2023-03-07 DIAGNOSIS — M47816 Spondylosis without myelopathy or radiculopathy, lumbar region: Secondary | ICD-10-CM | POA: Diagnosis not present

## 2023-03-07 DIAGNOSIS — M47812 Spondylosis without myelopathy or radiculopathy, cervical region: Secondary | ICD-10-CM

## 2023-03-07 DIAGNOSIS — G8929 Other chronic pain: Secondary | ICD-10-CM

## 2023-03-07 MED ORDER — HYDROCODONE-ACETAMINOPHEN 5-325 MG PO TABS: 1.0000 | ORAL_TABLET | Freq: Two times a day (BID) | ORAL | 0 refills | Status: DC

## 2023-03-07 MED ORDER — METAXALONE 800 MG PO TABS: 800.0000 mg | ORAL_TABLET | Freq: Two times a day (BID) | ORAL | 3 refills | Status: AC

## 2023-03-07 MED ORDER — HYDROCODONE-ACETAMINOPHEN 5-325 MG PO TABS: 0.5000 | ORAL_TABLET | Freq: Two times a day (BID) | ORAL | 0 refills | Status: DC | PRN

## 2023-03-07 NOTE — Progress Notes (Signed)
 Virtual Visit via Telephone Note  I connected with Debra Myers on 03/07/23 at 11:20 AM EST by telephone and verified that I am speaking with the correct person using two identifiers.  Location: Patient: Home Provider: Pain control center   I discussed the limitations, risks, security and privacy concerns of performing an evaluation and management service by telephone and the availability of in person appointments. I also discussed with the patient that there may be a patient responsible charge related to this service. The patient expressed understanding and agreed to proceed.   History of Present Illness: I spoke with Debra Myers via telephone for her virtual conference.  She reports that she is doing reasonably well.  She is trying to stay active.  She is trying to restrict her hydrocodone  to 2 tablets a day and also avoid usage of the hydrocodone  at bedtime or in the evening.  She attributes this to some of the pain she is experiencing at night.  She still takes her 800 mg ibuprofen  3 times a day and has been using Skelaxin  up to twice a day generally half a tablet at a time to help with muscle spasm and low back pain.  Low back pain she experiences mainly in the left and right lower lumbar region with spasming noted.  Occasionally she gets some pain in the hip buttock region.  Her lower extremity strength function bowel and bladder function been good and the medications continue to enable her to stay active functional where she has failed more conservative therapy.  Otherwise she is in her usual state of health.  Review of systems: General: No fevers or chills Pulmonary: No shortness of breath or dyspnea Cardiac: No angina or palpitations or lightheadedness GI: No abdominal pain or constipation Psych: No depression    Observations/Objective:  Current Outpatient Medications:    albuterol  (VENTOLIN  HFA) 108 (90 Base) MCG/ACT inhaler, Inhale 2 puffs into the lungs every 6 (six) hours as  needed for wheezing or shortness of breath., Disp: 18 g, Rfl: 0   atorvastatin  (LIPITOR) 10 MG tablet, TAKE 1 TABLET(10 MG) BY MOUTH DAILY FOR CHOLESTEROL, Disp: 90 tablet, Rfl: 2   buPROPion  (WELLBUTRIN  XL) 300 MG 24 hr tablet, Take 150 mg by mouth daily., Disp: , Rfl:    CALCIUM -MAGNESIUM-VITAMIN D  ER PO, Take 2 tablets by mouth daily. (Patient not taking: Reported on 01/12/2023), Disp: , Rfl:    cetirizine  (ZYRTEC ) 10 MG tablet, Take 10 mg by mouth at bedtime., Disp: , Rfl:    famotidine  (PEPCID  AC) 10 MG tablet, Take 10 mg by mouth as needed for heartburn or indigestion., Disp: , Rfl:    fluticasone  (FLONASE ) 50 MCG/ACT nasal spray, SHAKE LIQUID AND USE 1 SPRAY IN EACH NOSTRIL TWICE DAILY AS NEEDED FOR ALLERGIES OR RHINITIS (Patient taking differently: Place 1 spray into both nostrils 2 (two) times daily as needed.), Disp: 16 g, Rfl: 3   [START ON 03/14/2023] HYDROcodone -acetaminophen  (NORCO/VICODIN) 5-325 MG tablet, Take 0.5-1 tablets by mouth 2 (two) times daily as needed for severe pain (pain score 7-10). Must last 30 days, Disp: 60 tablet, Rfl: 0   hydrocortisone cream 0.5 %, Apply 1 Application topically as needed for itching., Disp: , Rfl:    ibuprofen  (ADVIL ) 800 MG tablet, Take 1 tablet (800 mg total) by mouth 2 (two) times daily as needed for moderate pain (pain score 4-6)., Disp: 180 tablet, Rfl: 0   lamoTRIgine  (LAMICTAL ) 200 MG tablet, Take 300 mg by mouth at bedtime. , Disp: ,  Rfl:    lisdexamfetamine (VYVANSE) 40 MG capsule, Take 40 mg by mouth every morning., Disp: , Rfl:    LORazepam  (ATIVAN ) 1 MG tablet, Take 0.5-1 mg by mouth at bedtime. Per pt takes one nightly regularly, Disp: , Rfl:    metaxalone  (SKELAXIN ) 800 MG tablet, Take 1 tablet (800 mg total) by mouth 2 (two) times daily., Disp: 60 tablet, Rfl: 3   metFORMIN  (GLUCOPHAGE -XR) 500 MG 24 hr tablet, Take 1 tablet (500 mg total) by mouth daily with breakfast. For blood sugar, Disp: 90 tablet, Rfl: 3   montelukast  (SINGULAIR )  10 MG tablet, Take 1 tablet (10 mg total) by mouth at bedtime. For allergies/asthma, Disp: 90 tablet, Rfl: 3   naloxone  (NARCAN ) nasal spray 4 mg/0.1 mL, Place 1 spray into the nose as needed for up to 365 doses (for opioid-induced respiratory depresssion). In case of emergency (overdose), spray once into each nostril. If no response within 3 minutes, repeat application and call 911., Disp: 1 each, Rfl: 0   PARoxetine  (PAXIL -CR) 37.5 MG 24 hr tablet, Take 2 tablets by mouth at bedtime., Disp: , Rfl:    RA ASPIRIN  EC ADULT LOW ST 81 MG EC tablet, take 1 tablet by mouth once daily (Patient taking differently: Take 81 mg by mouth daily.), Disp: 30 tablet, Rfl: 0   spironolactone  (ALDACTONE ) 25 MG tablet, TAKE 1 TABLET(25 MG) BY MOUTH DAILY for blood pressure, Disp: 90 tablet, Rfl: 3   Past Medical History:  Diagnosis Date   Bulging lumbar disc (L3-4) 02/26/2015   Chronic pain syndrome    followed by pain clinic;    back, neck, lower extremities   DDD (degenerative disc disease), cervical    DDD (degenerative disc disease), lumbar    GAD (generalized anxiety disorder)    GERD (gastroesophageal reflux disease)    History of transient ischemic attack (TIA) 06/11/2015   admission in epic--  (02-10-2022 per pt no resiudal's and no s&s since)   Hypertension    MDD (major depressive disorder)    Mild intermittent asthma    followed  by pcp   PCOS (polycystic ovarian syndrome)    Pre-diabetes    Retained intrauterine contraceptive device (IUD)    Seasonal allergic rhinitis    Wears contact lenses    Assessment and Plan:  1. Encounter for medication management   2. Chronic low back pain (1ry area of Pain) (Bilateral) (L>R)   3. Chronic lower extremity pain (2ry area of Pain) (Left)   4. Chronic neck pain (3ry area of Pain) (Bilateral) (L>R)   5. Cervical facet syndrome (Bilateral) (L>R)   6. Lumbar facet syndrome (Bilateral) (L>R)   7. Chronic cervical radicular pain (Bilateral) (L>R)   8.  Musculoskeletal pain   9. Chronic pain syndrome   10. Chronic use of opiate for therapeutic purpose   11. Pharmacologic therapy   12. Encounter for chronic pain management   13. Muscle spasm, nocturnal    Based on conversation it is appropriate to refill her medicines for the next 2 month and will schedule her for a return to clinic in 2 months.  I have reviewed the [place]  practitioner database information is appropriate for refill.  Continue core exercising as reviewed today and continue follow-up with her primary care physician for baseline medical care.  Will defer any other interventional therapies that she has had some success with trigger point injections as reviewed today which would be reasonable if she gets any significant spasm in the low  back. Follow Up Instructions:    I discussed the assessment and treatment plan with the patient. The patient was provided an opportunity to ask questions and all were answered. The patient agreed with the plan and demonstrated an understanding of the instructions.   The patient was advised to call back or seek an in-person evaluation if the symptoms worsen or if the condition fails to improve as anticipated.  I provided 30 minutes of non-face-to-face time during this encounter.   Lynwood KANDICE Clause, MD

## 2023-04-26 ENCOUNTER — Ambulatory Visit: Payer: Managed Care, Other (non HMO) | Attending: Anesthesiology | Admitting: Anesthesiology

## 2023-04-26 ENCOUNTER — Encounter: Payer: Self-pay | Admitting: Anesthesiology

## 2023-04-26 DIAGNOSIS — G8929 Other chronic pain: Secondary | ICD-10-CM

## 2023-04-26 DIAGNOSIS — G894 Chronic pain syndrome: Secondary | ICD-10-CM

## 2023-04-26 DIAGNOSIS — M4722 Other spondylosis with radiculopathy, cervical region: Secondary | ICD-10-CM | POA: Diagnosis not present

## 2023-04-26 DIAGNOSIS — M79605 Pain in left leg: Secondary | ICD-10-CM | POA: Diagnosis not present

## 2023-04-26 DIAGNOSIS — M47816 Spondylosis without myelopathy or radiculopathy, lumbar region: Secondary | ICD-10-CM | POA: Diagnosis not present

## 2023-04-26 DIAGNOSIS — M545 Low back pain, unspecified: Secondary | ICD-10-CM

## 2023-04-26 DIAGNOSIS — Z79899 Other long term (current) drug therapy: Secondary | ICD-10-CM

## 2023-04-26 DIAGNOSIS — Z79891 Long term (current) use of opiate analgesic: Secondary | ICD-10-CM

## 2023-04-26 DIAGNOSIS — M7918 Myalgia, other site: Secondary | ICD-10-CM

## 2023-04-26 DIAGNOSIS — M47812 Spondylosis without myelopathy or radiculopathy, cervical region: Secondary | ICD-10-CM

## 2023-04-26 MED ORDER — IBUPROFEN 800 MG PO TABS: 800.0000 mg | ORAL_TABLET | Freq: Two times a day (BID) | ORAL | 0 refills | Status: AC | PRN

## 2023-04-26 MED ORDER — HYDROCODONE-ACETAMINOPHEN 5-325 MG PO TABS: 1.0000 | ORAL_TABLET | Freq: Two times a day (BID) | ORAL | 0 refills | Status: DC | PRN

## 2023-04-26 MED ORDER — HYDROCODONE-ACETAMINOPHEN 5-325 MG PO TABS: 1.0000 | ORAL_TABLET | Freq: Two times a day (BID) | ORAL | 0 refills | Status: DC

## 2023-04-26 NOTE — Patient Instructions (Signed)

## 2023-04-27 NOTE — Progress Notes (Signed)
 Virtual Visit via Telephone Note  I connected with Debra Myers on 04/27/23 at  9:20 AM EST by telephone and verified that I am speaking with the correct person using two identifiers.  Location: Patient: Home Provider: Pain control center   I discussed the limitations, risks, security and privacy concerns of performing an evaluation and management service by telephone and the availability of in person appointments. I also discussed with the patient that there may be a patient responsible charge related to this service. The patient expressed understanding and agreed to proceed.   History of Present Illness: I spoke with Debra Myers via telephone as we are unable link for the video portion of the conference.  She continues to do well with her current opioid management.  This is working well for her.  She generally takes one half of the 5 mg tablet about twice a day to help with pain relief and this keeps her neck and low back pain under adequate control.  She gets about a 65% 70% reduction in pain per her description with the medication lasting about 3 to 4 hours.  Otherwise she is in her usual state of health.  She also uses some 800 mg ibuprofen tablets to help with pain and discomfort.  This combination works well for her.  She denies any side effects with the medication.  The symptom quality characteristic and distribution has been stable for multiple years.  Otherwise she is doing well with no change in lower extremity strength or function.  Review of systems: General: No fevers or chills Pulmonary: No shortness of breath or dyspnea Cardiac: No angina or palpitations or lightheadedness GI: No abdominal pain or constipation Psych: No depression    Observations/Objective:  Current Outpatient Medications:    albuterol (VENTOLIN HFA) 108 (90 Base) MCG/ACT inhaler, Inhale 2 puffs into the lungs every 6 (six) hours as needed for wheezing or shortness of breath., Disp: 18 g, Rfl: 0    atorvastatin (LIPITOR) 10 MG tablet, TAKE 1 TABLET(10 MG) BY MOUTH DAILY FOR CHOLESTEROL, Disp: 90 tablet, Rfl: 2   buPROPion (WELLBUTRIN XL) 300 MG 24 hr tablet, Take 150 mg by mouth daily., Disp: , Rfl:    CALCIUM-MAGNESIUM-VITAMIN D ER PO, Take 2 tablets by mouth daily. (Patient not taking: Reported on 01/12/2023), Disp: , Rfl:    cetirizine (ZYRTEC) 10 MG tablet, Take 10 mg by mouth at bedtime., Disp: , Rfl:    famotidine (PEPCID AC) 10 MG tablet, Take 10 mg by mouth as needed for heartburn or indigestion., Disp: , Rfl:    fluticasone (FLONASE) 50 MCG/ACT nasal spray, SHAKE LIQUID AND USE 1 SPRAY IN EACH NOSTRIL TWICE DAILY AS NEEDED FOR ALLERGIES OR RHINITIS (Patient taking differently: Place 1 spray into both nostrils 2 (two) times daily as needed.), Disp: 16 g, Rfl: 3   [START ON 05/13/2023] HYDROcodone-acetaminophen (NORCO/VICODIN) 5-325 MG tablet, Take 1 tablet by mouth 2 (two) times daily., Disp: 60 tablet, Rfl: 0   [START ON 06/12/2023] HYDROcodone-acetaminophen (NORCO/VICODIN) 5-325 MG tablet, Take 1 tablet by mouth 2 (two) times daily as needed for severe pain (pain score 7-10). Must last 30 days, Disp: 60 tablet, Rfl: 0   hydrocortisone cream 0.5 %, Apply 1 Application topically as needed for itching., Disp: , Rfl:    ibuprofen (ADVIL) 800 MG tablet, Take 1 tablet (800 mg total) by mouth 2 (two) times daily as needed for moderate pain (pain score 4-6)., Disp: 180 tablet, Rfl: 0   lamoTRIgine (LAMICTAL) 200  MG tablet, Take 300 mg by mouth at bedtime. , Disp: , Rfl:    lisdexamfetamine (VYVANSE) 40 MG capsule, Take 40 mg by mouth every morning., Disp: , Rfl:    LORazepam (ATIVAN) 1 MG tablet, Take 0.5-1 mg by mouth at bedtime. Per pt takes one nightly regularly, Disp: , Rfl:    metaxalone (SKELAXIN) 800 MG tablet, Take 1 tablet (800 mg total) by mouth 2 (two) times daily., Disp: 60 tablet, Rfl: 3   metFORMIN (GLUCOPHAGE-XR) 500 MG 24 hr tablet, Take 1 tablet (500 mg total) by mouth daily with  breakfast. For blood sugar, Disp: 90 tablet, Rfl: 3   montelukast (SINGULAIR) 10 MG tablet, Take 1 tablet (10 mg total) by mouth at bedtime. For allergies/asthma, Disp: 90 tablet, Rfl: 3   naloxone (NARCAN) nasal spray 4 mg/0.1 mL, Place 1 spray into the nose as needed for up to 365 doses (for opioid-induced respiratory depresssion). In case of emergency (overdose), spray once into each nostril. If no response within 3 minutes, repeat application and call 911., Disp: 1 each, Rfl: 0   PARoxetine (PAXIL-CR) 37.5 MG 24 hr tablet, Take 2 tablets by mouth at bedtime., Disp: , Rfl:    RA ASPIRIN EC ADULT LOW ST 81 MG EC tablet, take 1 tablet by mouth once daily (Patient taking differently: Take 81 mg by mouth daily.), Disp: 30 tablet, Rfl: 0   spironolactone (ALDACTONE) 25 MG tablet, TAKE 1 TABLET(25 MG) BY MOUTH DAILY for blood pressure, Disp: 90 tablet, Rfl: 3   Past Medical History:  Diagnosis Date   Bulging lumbar disc (L3-4) 02/26/2015   Chronic pain syndrome    followed by pain clinic;    back, neck, lower extremities   DDD (degenerative disc disease), cervical    DDD (degenerative disc disease), lumbar    GAD (generalized anxiety disorder)    GERD (gastroesophageal reflux disease)    History of transient ischemic attack (TIA) 06/11/2015   admission in epic--  (02-10-2022 per pt no resiudal's and no s&s since)   Hypertension    MDD (major depressive disorder)    Mild intermittent asthma    followed  by pcp   PCOS (polycystic ovarian syndrome)    Pre-diabetes    Retained intrauterine contraceptive device (IUD)    Seasonal allergic rhinitis    Wears contact lenses    Assessment and Plan:  1. Encounter for medication management   2. Chronic low back pain (1ry area of Pain) (Bilateral) (L>R)   3. Chronic lower extremity pain (2ry area of Pain) (Left)   4. Chronic neck pain (3ry area of Pain) (Bilateral) (L>R)   5. Cervical facet syndrome (Bilateral) (L>R)   6. Lumbar facet syndrome  (Bilateral) (L>R)   7. Chronic cervical radicular pain (Bilateral) (L>R)   8. Musculoskeletal pain   9. Chronic pain syndrome   10. Chronic use of opiate for therapeutic purpose   11. Pharmacologic therapy   12. Encounter for chronic pain management    Based our conversation it is appropriate to refill her medicines for the next 2 months.  Will continue with her current regimen no other pharmacologic changes are noted.  Continue with stretching strengthening exercises for upper and lower extremity strength and function as reviewed today.  Continue follow-up with her primary care physician for baseline medical care with return to clinic in 2 months. Follow Up Instructions:    I discussed the assessment and treatment plan with the patient. The patient was provided an opportunity to  ask questions and all were answered. The patient agreed with the plan and demonstrated an understanding of the instructions.   The patient was advised to call back or seek an in-person evaluation if the symptoms worsen or if the condition fails to improve as anticipated.  I provided 30 minutes of non-face-to-face time during this encounter.   Yevette Edwards, MD

## 2023-04-29 LAB — HM DIABETES EYE EXAM

## 2023-07-19 ENCOUNTER — Telehealth: Payer: Self-pay

## 2023-07-19 NOTE — Telephone Encounter (Signed)
 Telephone call from patient stating that she needed to make a Telephone visit with Dr. Welton Hall. Patient scheduled for 08/09/23, however patient has already ran out of medication. Wants to know if she can get a refill or enough meds to last until appointment.

## 2023-08-09 ENCOUNTER — Ambulatory Visit: Attending: Anesthesiology | Admitting: Anesthesiology

## 2023-08-09 DIAGNOSIS — M47812 Spondylosis without myelopathy or radiculopathy, cervical region: Secondary | ICD-10-CM

## 2023-08-09 DIAGNOSIS — G8929 Other chronic pain: Secondary | ICD-10-CM

## 2023-08-09 DIAGNOSIS — G894 Chronic pain syndrome: Secondary | ICD-10-CM

## 2023-08-09 DIAGNOSIS — M47816 Spondylosis without myelopathy or radiculopathy, lumbar region: Secondary | ICD-10-CM

## 2023-08-09 DIAGNOSIS — M545 Low back pain, unspecified: Secondary | ICD-10-CM

## 2023-08-09 DIAGNOSIS — Z79899 Other long term (current) drug therapy: Secondary | ICD-10-CM

## 2023-08-09 DIAGNOSIS — Z79891 Long term (current) use of opiate analgesic: Secondary | ICD-10-CM

## 2023-08-09 DIAGNOSIS — M7918 Myalgia, other site: Secondary | ICD-10-CM

## 2023-08-09 MED ORDER — HYDROCODONE-ACETAMINOPHEN 5-325 MG PO TABS: 1.0000 | ORAL_TABLET | Freq: Two times a day (BID) | ORAL | 0 refills | Status: DC

## 2023-08-10 ENCOUNTER — Encounter: Payer: Self-pay | Admitting: Anesthesiology

## 2023-08-10 NOTE — Progress Notes (Signed)
 I attempted to call Debra Myers twice on the phone and left 2 messages.  Ultimately we were unable to touch base for the telemedicine review and I am schedule her for a 1 month return.

## 2023-10-25 LAB — HM MAMMOGRAPHY

## 2023-10-28 ENCOUNTER — Encounter: Payer: Self-pay | Admitting: Internal Medicine

## 2023-10-28 ENCOUNTER — Ambulatory Visit (HOSPITAL_BASED_OUTPATIENT_CLINIC_OR_DEPARTMENT_OTHER): Payer: Self-pay | Admitting: Obstetrics and Gynecology

## 2023-11-01 ENCOUNTER — Encounter: Payer: Self-pay | Admitting: Anesthesiology

## 2023-11-01 ENCOUNTER — Ambulatory Visit: Attending: Anesthesiology | Admitting: Anesthesiology

## 2023-11-01 DIAGNOSIS — M5412 Radiculopathy, cervical region: Secondary | ICD-10-CM

## 2023-11-01 DIAGNOSIS — G894 Chronic pain syndrome: Secondary | ICD-10-CM

## 2023-11-01 DIAGNOSIS — M79605 Pain in left leg: Secondary | ICD-10-CM | POA: Diagnosis not present

## 2023-11-01 DIAGNOSIS — M545 Low back pain, unspecified: Secondary | ICD-10-CM

## 2023-11-01 DIAGNOSIS — G8929 Other chronic pain: Secondary | ICD-10-CM

## 2023-11-01 DIAGNOSIS — M542 Cervicalgia: Secondary | ICD-10-CM

## 2023-11-01 DIAGNOSIS — Z79891 Long term (current) use of opiate analgesic: Secondary | ICD-10-CM

## 2023-11-01 DIAGNOSIS — Z79899 Other long term (current) drug therapy: Secondary | ICD-10-CM | POA: Diagnosis not present

## 2023-11-01 DIAGNOSIS — M7918 Myalgia, other site: Secondary | ICD-10-CM

## 2023-11-01 DIAGNOSIS — M47812 Spondylosis without myelopathy or radiculopathy, cervical region: Secondary | ICD-10-CM

## 2023-11-01 DIAGNOSIS — M47816 Spondylosis without myelopathy or radiculopathy, lumbar region: Secondary | ICD-10-CM

## 2023-11-01 MED ORDER — PREDNISONE 10 MG (21) PO TBPK: ORAL_TABLET | Freq: Every day | ORAL | 0 refills | Status: AC

## 2023-11-01 MED ORDER — HYDROCODONE-ACETAMINOPHEN 5-325 MG PO TABS: 1.0000 | ORAL_TABLET | Freq: Two times a day (BID) | ORAL | 0 refills | Status: DC

## 2023-11-01 MED ORDER — METAXALONE 800 MG PO TABS: 800.0000 mg | ORAL_TABLET | Freq: Two times a day (BID) | ORAL | 1 refills | Status: AC

## 2023-11-01 MED ORDER — HYDROCODONE-ACETAMINOPHEN 5-325 MG PO TABS: 1.0000 | ORAL_TABLET | Freq: Two times a day (BID) | ORAL | 0 refills | Status: DC | PRN

## 2023-11-01 NOTE — Progress Notes (Signed)
 Virtual Visit via Telephone Note  I connected with Debra Myers on 11/01/23 at  1:00 PM EDT by telephone and verified that I am speaking with the correct person using two identifiers.  Location: Patient: Home Provider: [facility]   I discussed the limitations, risks, security and privacy concerns of performing an evaluation and management service by telephone and the availability of in person appointments. I also discussed with the patient that there may be a patient responsible charge related to this service. The patient expressed understanding and agreed to proceed.   History of Present Illness: I spoke to Debra Myers via telephone for our pain clinic evaluation today.  She could not do the video portion of the conversation.  She states that she has had some difficulties over the last couple months secondary to the recent death of her mother.  Because of that she has been sleeping in several different locations and has had some exacerbation of her neck and low back pain.  It is the same quality characteristic and distribution of pain that she has previously had.  In the past she is also seeing a chiropractor and is working with that as well.  It is primarily centralized neck pain and low back pain of a gnawing aching nature with occasional numbness and tingling affecting the hands similar to what she has had in the past with exacerbation.  She is out of her hydrocodone  at present.  Otherwise no change in the quality characteristic or distribution of her symptoms is noted.  No recent change in strength is noted.  Review of systems: General: No fevers or chills Pulmonary: No shortness of breath or dyspnea Cardiac: No angina or palpitations or lightheadedness GI: No abdominal pain or constipation Psych: No depression    Observations/Objective:  Current Outpatient Medications:    metaxalone  (SKELAXIN ) 800 MG tablet, Take 1 tablet (800 mg total) by mouth 2 (two) times daily., Disp: 60  tablet, Rfl: 1   predniSONE  (STERAPRED UNI-PAK 21 TAB) 10 MG (21) TBPK tablet, Take by mouth daily for 12 days. 6 tablets day one and two, five tablets day 3 and day 4,  and  taper by one tablet every other day  over 12 days as directed, Disp: 42 tablet, Rfl: 0   albuterol  (VENTOLIN  HFA) 108 (90 Base) MCG/ACT inhaler, Inhale 2 puffs into the lungs every 6 (six) hours as needed for wheezing or shortness of breath., Disp: 18 g, Rfl: 0   atorvastatin  (LIPITOR) 10 MG tablet, TAKE 1 TABLET(10 MG) BY MOUTH DAILY FOR CHOLESTEROL, Disp: 90 tablet, Rfl: 2   buPROPion  (WELLBUTRIN  XL) 300 MG 24 hr tablet, Take 150 mg by mouth daily., Disp: , Rfl:    CALCIUM -MAGNESIUM-VITAMIN D  ER PO, Take 2 tablets by mouth daily. (Patient not taking: Reported on 01/12/2023), Disp: , Rfl:    cetirizine  (ZYRTEC ) 10 MG tablet, Take 10 mg by mouth at bedtime., Disp: , Rfl:    famotidine  (PEPCID  AC) 10 MG tablet, Take 10 mg by mouth as needed for heartburn or indigestion., Disp: , Rfl:    fluticasone  (FLONASE ) 50 MCG/ACT nasal spray, SHAKE LIQUID AND USE 1 SPRAY IN EACH NOSTRIL TWICE DAILY AS NEEDED FOR ALLERGIES OR RHINITIS (Patient taking differently: Place 1 spray into both nostrils 2 (two) times daily as needed.), Disp: 16 g, Rfl: 3   HYDROcodone -acetaminophen  (NORCO/VICODIN) 5-325 MG tablet, Take 1 tablet by mouth 2 (two) times daily as needed for severe pain (pain score 7-10). Must last 30 days, Disp:  60 tablet, Rfl: 0   [START ON 12/01/2023] HYDROcodone -acetaminophen  (NORCO/VICODIN) 5-325 MG tablet, Take 1 tablet by mouth 2 (two) times daily., Disp: 60 tablet, Rfl: 0   hydrocortisone cream 0.5 %, Apply 1 Application topically as needed for itching., Disp: , Rfl:    lamoTRIgine  (LAMICTAL ) 200 MG tablet, Take 300 mg by mouth at bedtime. , Disp: , Rfl:    lisdexamfetamine (VYVANSE) 40 MG capsule, Take 40 mg by mouth every morning., Disp: , Rfl:    LORazepam  (ATIVAN ) 1 MG tablet, Take 0.5-1 mg by mouth at bedtime. Per pt takes one  nightly regularly, Disp: , Rfl:    metFORMIN  (GLUCOPHAGE -XR) 500 MG 24 hr tablet, Take 1 tablet (500 mg total) by mouth daily with breakfast. For blood sugar, Disp: 90 tablet, Rfl: 3   montelukast  (SINGULAIR ) 10 MG tablet, Take 1 tablet (10 mg total) by mouth at bedtime. For allergies/asthma, Disp: 90 tablet, Rfl: 3   PARoxetine  (PAXIL -CR) 37.5 MG 24 hr tablet, Take 2 tablets by mouth at bedtime., Disp: , Rfl:    RA ASPIRIN  EC ADULT LOW ST 81 MG EC tablet, take 1 tablet by mouth once daily (Patient taking differently: Take 81 mg by mouth daily.), Disp: 30 tablet, Rfl: 0   spironolactone  (ALDACTONE ) 25 MG tablet, TAKE 1 TABLET(25 MG) BY MOUTH DAILY for blood pressure, Disp: 90 tablet, Rfl: 3   Past Medical History:  Diagnosis Date   Bulging lumbar disc (L3-4) 02/26/2015   Chronic pain syndrome    followed by pain clinic;    back, neck, lower extremities   DDD (degenerative disc disease), cervical    DDD (degenerative disc disease), lumbar    GAD (generalized anxiety disorder)    GERD (gastroesophageal reflux disease)    History of transient ischemic attack (TIA) 06/11/2015   admission in epic--  (02-10-2022 per pt no resiudal's and no s&s since)   Hypertension    MDD (major depressive disorder)    Mild intermittent asthma    followed  by pcp   PCOS (polycystic ovarian syndrome)    Pre-diabetes    Retained intrauterine contraceptive device (IUD)    Seasonal allergic rhinitis    Wears contact lenses    Assessment and Plan:  1. Encounter for medication management   2. Chronic low back pain (1ry area of Pain) (Bilateral) (L>R)   3. Chronic lower extremity pain (2ry area of Pain) (Left)   4. Chronic neck pain (3ry area of Pain) (Bilateral) (L>R)   5. Cervical facet syndrome (Bilateral) (L>R)   6. Lumbar facet syndrome (Bilateral) (L>R)   7. Chronic cervical radicular pain (Bilateral) (L>R)   8. Musculoskeletal pain   9. Chronic pain syndrome   10. Chronic use of opiate for  therapeutic purpose   11. Pharmacologic therapy   12. Encounter for chronic pain management    Based on conversation today and after review of the [place]  practitioner database and medical information I gets appropriate refill of medicines for September 9 and October 9 for the Vicodin 5 mg tablets twice daily.  She can take these as needed.  I am also going to call her in a prednisone  tapering Dosepak at 60 mg start for 2 days with tapering as instructed.  She understands the risk and benefits of oral prednisone .  Hopefully this will help reduce some of the inflammation affecting her low back and neck.  Continue with stretching strengthening exercises.  Will schedule her for return to clinic in 2 months.  In  the meantime continue follow-up with her primary care physician for baseline medical care. Follow Up Instructions:    I discussed the assessment and treatment plan with the patient. The patient was provided an opportunity to ask questions and all were answered. The patient agreed with the plan and demonstrated an understanding of the instructions.   The patient was advised to call back or seek an in-person evaluation if the symptoms worsen or if the condition fails to improve as anticipated.  I provided 30 minutes of non-face-to-face time during this encounter.   Lynwood KANDICE Clause, MD

## 2023-11-22 ENCOUNTER — Ambulatory Visit (AMBULATORY_SURGERY_CENTER)

## 2023-11-22 ENCOUNTER — Encounter: Payer: Self-pay | Admitting: Internal Medicine

## 2023-11-22 VITALS — Ht 64.0 in | Wt 250.0 lb

## 2023-11-22 DIAGNOSIS — Z1211 Encounter for screening for malignant neoplasm of colon: Secondary | ICD-10-CM

## 2023-11-22 MED ORDER — NA SULFATE-K SULFATE-MG SULF 17.5-3.13-1.6 GM/177ML PO SOLN: 1.0000 | Freq: Once | ORAL | 0 refills | Status: AC

## 2023-11-22 NOTE — Progress Notes (Signed)
 No egg or soy allergy known to patient  No issues known to pt with past sedation with any surgeries or procedures Patient denies ever being told they had issues or difficulty with intubation  No FH of Malignant Hyperthermia Pt is not on diet pills Pt is not on  home 02  Pt is not on blood thinners  Pt denies issues with constipation  No A fib or A flutter Have any cardiac testing pending-- no  LOA: independent   Prep: suprep   Patient's chart reviewed by Norleen Schillings CNRA prior to previsit and patient appropriate for the [facility].  Previsit completed and red dot placed by patient's name on their procedure day (on provider's schedule).     PV completed with patient. Prep instructions sent via mychart and home address.

## 2023-11-28 ENCOUNTER — Other Ambulatory Visit: Payer: Self-pay

## 2023-11-28 DIAGNOSIS — E781 Pure hyperglyceridemia: Secondary | ICD-10-CM

## 2023-11-28 MED ORDER — ATORVASTATIN CALCIUM 10 MG PO TABS: ORAL_TABLET | ORAL | 0 refills | Status: DC

## 2023-11-28 NOTE — Telephone Encounter (Signed)
 Patient is due for CPE/follow up shortly after November 20th, this will be required prior to any further refills.  Please schedule, thank you!

## 2023-12-06 ENCOUNTER — Encounter: Admitting: Internal Medicine

## 2023-12-12 NOTE — Telephone Encounter (Signed)
 lvm for pt to call office to schedule appt.

## 2023-12-13 ENCOUNTER — Encounter: Payer: Self-pay | Admitting: Internal Medicine

## 2023-12-13 ENCOUNTER — Ambulatory Visit: Admitting: Internal Medicine

## 2023-12-13 VITALS — BP 139/81 | HR 81 | Temp 97.5°F | Resp 15 | Ht 64.0 in | Wt 250.0 lb

## 2023-12-13 DIAGNOSIS — D120 Benign neoplasm of cecum: Secondary | ICD-10-CM | POA: Diagnosis not present

## 2023-12-13 DIAGNOSIS — K648 Other hemorrhoids: Secondary | ICD-10-CM | POA: Diagnosis not present

## 2023-12-13 DIAGNOSIS — D122 Benign neoplasm of ascending colon: Secondary | ICD-10-CM

## 2023-12-13 DIAGNOSIS — Z1211 Encounter for screening for malignant neoplasm of colon: Secondary | ICD-10-CM | POA: Diagnosis present

## 2023-12-13 DIAGNOSIS — K635 Polyp of colon: Secondary | ICD-10-CM | POA: Diagnosis not present

## 2023-12-13 MED ORDER — SODIUM CHLORIDE 0.9 % IV SOLN: 500.0000 mL | Freq: Once | INTRAVENOUS | Status: DC

## 2023-12-13 NOTE — Patient Instructions (Signed)
Handout provided on polyps.  Resume previous diet.  Continue present medications.  Await pathology results.  Repeat colonoscopy in 5 years for surveillance.   YOU HAD AN ENDOSCOPIC PROCEDURE TODAY AT THE [facility]:   Refer to the procedure report that was given to you for any specific questions about what was found during the examination.  If the procedure report does not answer your questions, please call your gastroenterologist to clarify.  If you requested that your care partner not be given the details of your procedure findings, then the procedure report has been included in a sealed envelope for you to review at your convenience later.  YOU SHOULD EXPECT: Some feelings of bloating in the abdomen. Passage of more gas than usual.  Walking can help get rid of the air that was put into your GI tract during the procedure and reduce the bloating. If you had a lower endoscopy (such as a colonoscopy or flexible sigmoidoscopy) you may notice spotting of blood in your stool or on the toilet paper. If you underwent a bowel prep for your procedure, you may not have a normal bowel movement for a few days.  Please Note:  You might notice some irritation and congestion in your nose or some drainage.  This is from the oxygen used during your procedure.  There is no need for concern and it should clear up in a day or so.  SYMPTOMS TO REPORT IMMEDIATELY:  Following lower endoscopy (colonoscopy or flexible sigmoidoscopy):  Excessive amounts of blood in the stool  Significant tenderness or worsening of abdominal pains  Swelling of the abdomen that is new, acute  Fever of 100F or higher  For urgent or emergent issues, a gastroenterologist can be reached at any hour by calling [phone]. Do not use MyChart messaging for urgent concerns.    DIET:  We do recommend a small meal at first, but then you may proceed to your regular diet.  Drink plenty of fluids but you should avoid alcoholic  beverages for 24 hours.  ACTIVITY:  You should plan to take it easy for the rest of today and you should NOT DRIVE or use heavy machinery until tomorrow (because of the sedation medicines used during the test).    FOLLOW UP: Our staff will call the number listed on your records the next business day following your procedure.  We will call around 7:15- 8:00 am to check on you and address any questions or concerns that you may have regarding the information given to you following your procedure. If we do not reach you, we will leave a message.     If any biopsies were taken you will be contacted by phone or by letter within the next 1-3 weeks.  Please call us at [phone] if you have not heard about the biopsies in 3 weeks.    SIGNATURES/CONFIDENTIALITY: You and/or your care partner have signed paperwork which will be entered into your electronic medical record.  These signatures attest to the fact that that the information above on your After Visit Summary has been reviewed and is understood.  Full responsibility of the confidentiality of this discharge information lies with you and/or your care-partner.

## 2023-12-13 NOTE — Op Note (Signed)
 [facility] Patient Name: Debra Myers Procedure Date: 12/13/2023 9:56 AM MRN: [id number] Endoscopist: Norleen SAILOR. Abran , MD, [id number] Age: 58 Referring MD:  Date of Birth: 1965-07-29 Gender: Female Account #: [id number] Procedure:                Colonoscopy with cold snare polypectomy x 3 Indications:              Screening for colorectal malignant neoplasm Medicines:                Monitored Anesthesia Care Procedure:                Pre-Anesthesia Assessment:                           - Prior to the procedure, a History and Physical                            was performed, and patient medications and                            allergies were reviewed. The patient's tolerance of                            previous anesthesia was also reviewed. The risks                            and benefits of the procedure and the sedation                            options and risks were discussed with the patient.                            All questions were answered, and informed consent                            was obtained. Prior Anticoagulants: The patient has                            taken no anticoagulant or antiplatelet agents. ASA                            Grade Assessment: II - A patient with mild systemic                            disease. After reviewing the risks and benefits,                            the patient was deemed in satisfactory condition to                            undergo the procedure.                           After obtaining informed consent, the colonoscope  was passed under direct vision. Throughout the                            procedure, the patient's blood pressure, pulse, and                            oxygen saturations were monitored continuously. The                            Olympus Scope SN [id number] was introduced through the                            anus and advanced to the the cecum, identified by                             appendiceal orifice and ileocecal valve. The                            ileocecal valve, appendiceal orifice, and rectum                            were photographed. The quality of the bowel                            preparation was excellent. The colonoscopy was                            performed without difficulty. The patient tolerated                            the procedure well. The bowel preparation used was                            SUPREP via split dose instruction. Scope In: 10:13:16 AM Scope Out: 10:27:46 AM Scope Withdrawal Time: 0 hours 12 minutes 20 seconds  Total Procedure Duration: 0 hours 14 minutes 30 seconds  Findings:                 Three polyps were found in the ascending colon and                            cecum. The polyps were 2 to 8 mm in size. These                            polyps were removed with a cold snare. Resection                            and retrieval were complete.                           The exam was otherwise without abnormality on                            direct and retroflexion  views. Internal hemorrhoids. Complications:            No immediate complications. Estimated blood loss:                            None. Estimated Blood Loss:     Estimated blood loss: none. Impression:               - Three 2 to 8 mm polyps in the ascending colon and                            in the cecum, removed with a cold snare. Resected                            and retrieved.                           - The examination was otherwise normal on direct                            and retroflexion views. Internal hemorrhoids. Recommendation:           - Repeat colonoscopy in 5 years for surveillance.                           - Patient has a contact number available for                            emergencies. The signs and symptoms of potential                            delayed complications were discussed with the                             patient. Return to normal activities tomorrow.                            Written discharge instructions were provided to the                            patient.                           - Resume previous diet.                           - Continue present medications.                           - Await pathology results. Norleen SAILOR. Abran, MD 12/13/2023 10:32:16 AM This report has been signed electronically.

## 2023-12-13 NOTE — Progress Notes (Signed)
 HISTORY OF PRESENT ILLNESS:  Debra Myers is a 58 y.o. female sent directly for screening colonoscopy.  REVIEW OF SYSTEMS:  All non-GI ROS negative except for  Past Medical History:  Diagnosis Date   Bulging lumbar disc (L3-4) 02/26/2015   Chronic pain syndrome    followed by pain clinic;    back, neck, lower extremities   DDD (degenerative disc disease), cervical    DDD (degenerative disc disease), lumbar    GAD (generalized anxiety disorder)    GERD (gastroesophageal reflux disease)    History of transient ischemic attack (TIA) 06/11/2015   admission in epic--  (02-10-2022 per pt no resiudal's and no s&s since)   Hypertension    MDD (major depressive disorder)    Mild intermittent asthma    followed  by pcp   PCOS (polycystic ovarian syndrome)    Pre-diabetes    Retained intrauterine contraceptive device (IUD)    Seasonal allergic rhinitis    Wears contact lenses     Past Surgical History:  Procedure Laterality Date   CHOLECYSTECTOMY, LAPAROSCOPIC  11/22/1999   @[facility]  by dr d. vernetta   DILATATION & CURETTAGE/HYSTEROSCOPY WITH MYOSURE N/A 09/10/2014   Procedure: DILATATION & CURETTAGE/HYSTEROSCOPY WITH MYOSURE/INSERTION OF IUD;  Surgeon: Curlee VEAR Guan, MD;  Location: [facility];  Service: Gynecology;  Laterality: N/A;   HYSTEROSCOPY N/A 02/11/2022   Procedure: HYSTEROSCOPY WITH MYOSURE RESECTION OF ENDOMETRIAL POLYP, DILATION AND CURETTAGE;  Surgeon: Cathlyn JAYSON Nikki Bobie FORBES, MD;  Location: [facility];  Service: Gynecology;  Laterality: N/A;   IUD REMOVAL N/A 02/11/2022   Procedure: INTRAUTERINE DEVICE (IUD) REMOVAL;  Surgeon: Cathlyn JAYSON Nikki Bobie FORBES, MD;  Location: [facility];  Service: Gynecology;  Laterality: N/A;   LAPAROSCOPIC GASTRIC BANDING  04/2012   WISDOM TOOTH EXTRACTION      Social History Debra Myers  reports that she has never smoked. She has never used smokeless tobacco. She reports that she does not drink alcohol and  does not use drugs.  family history includes Asthma in her maternal grandmother; Cancer in her mother; Diabetes in her father; Hypertension in her father and mother; Stroke in her maternal grandfather and maternal grandmother.  Allergies  Allergen Reactions   Penicillins Other (See Comments)    Reaction:  GI upset  Has patient had a PCN reaction causing immediate rash, facial/tongue/throat swelling, SOB or lightheadedness with hypotension: No Has patient had a PCN reaction causing severe rash involving mucus membranes or skin necrosis: No Has patient had a PCN reaction that required hospitalization No Has patient had a PCN reaction occurring within the last 10 years: No If all of the above answers are NO, then may proceed with Cephalosporin use.   Red Dye #40 (Allura Red) Other (See Comments)    Numb tongue   Oxycodone  Itching   Sulfa Antibiotics Itching and Rash       PHYSICAL EXAMINATION: Vital signs: BP 129/73   Pulse 80   Temp (!) 97.5 F (36.4 C) (Temporal)   Ht 5' 4 (1.626 m)   Wt 250 lb (113.4 kg)   SpO2 95%   BMI 42.91 kg/m  General: Well-developed, well-nourished, no acute distress HEENT: Sclerae are anicteric, conjunctiva pink. Oral mucosa intact Lungs: Clear Heart: Regular Abdomen: soft, nontender, nondistended, no obvious ascites, no peritoneal signs, normal bowel sounds. No organomegaly. Extremities: No edema Psychiatric: alert and oriented x3. Cooperative     ASSESSMENT:  Colon cancer screening   PLAN:  Screening  colonoscopy

## 2023-12-13 NOTE — Progress Notes (Signed)
 Vss nad trans to pacu

## 2023-12-13 NOTE — Progress Notes (Signed)
 Called to room to assist during endoscopic procedure.  Patient ID and intended procedure confirmed with present staff. Received instructions for my participation in the procedure from the performing physician.

## 2023-12-14 ENCOUNTER — Telehealth: Payer: Self-pay | Admitting: *Deleted

## 2023-12-14 NOTE — Telephone Encounter (Signed)
  Follow up Call-     12/13/2023    9:00 AM  Call back number  Post procedure Call Back phone  # [phone]  Permission to leave phone message Yes     Patient questions:  Do you have a fever, pain , or abdominal swelling? No. Pain Score  0 *  Have you tolerated food without any problems? Yes.    Have you been able to return to your normal activities? Yes.    Do you have any questions about your discharge instructions: Diet   No. Medications  No. Follow up visit  No.  Do you have questions or concerns about your Care? No.  Actions: * If pain score is 4 or above: No action needed, pain <4.

## 2023-12-15 LAB — SURGICAL PATHOLOGY

## 2023-12-21 ENCOUNTER — Ambulatory Visit: Payer: Self-pay | Admitting: Internal Medicine

## 2024-01-10 ENCOUNTER — Ambulatory Visit

## 2024-01-10 VITALS — BP 138/80 | HR 92 | Temp 98.7°F | Ht 64.0 in | Wt 238.0 lb

## 2024-01-10 DIAGNOSIS — R059 Cough, unspecified: Secondary | ICD-10-CM | POA: Diagnosis not present

## 2024-01-10 DIAGNOSIS — J019 Acute sinusitis, unspecified: Secondary | ICD-10-CM | POA: Diagnosis not present

## 2024-01-10 DIAGNOSIS — B9689 Other specified bacterial agents as the cause of diseases classified elsewhere: Secondary | ICD-10-CM

## 2024-01-10 MED ORDER — BENZONATATE 200 MG PO CAPS: 200.0000 mg | ORAL_CAPSULE | Freq: Three times a day (TID) | ORAL | 0 refills | Status: AC | PRN

## 2024-01-10 MED ORDER — DOXYCYCLINE HYCLATE 100 MG PO TBEC: 100.0000 mg | DELAYED_RELEASE_TABLET | Freq: Two times a day (BID) | ORAL | 0 refills | Status: AC

## 2024-01-10 NOTE — Progress Notes (Signed)
 Subjective:   This visit was conducted in person. The patient gave informed consent to the use of Abridge AI technology to record the contents of the encounter as documented below.   Patient ID: Debra Myers, female    DOB: 07/18/1965, 58 y.o.   MRN: [id number]   Discussed the use of AI scribe software for clinical note transcription with the patient, who gave verbal consent to proceed.  History of Present Illness Debra Myers is a 58 year old female with allergy-induced asthma who presents with worsening cough and nasal congestion.  Her symptoms began two weeks ago, initially resembling seasonal allergies with head congestion and a sore throat. The sore throat worsened due to postnasal drip, leading to a cough described as 'wheezy'. Despite using her nasal spray, inhaler, and allergy medications, her cough has worsened over the past three to four days. She notes yellow-tinged nasal discharge and yellow sputum when coughing.  Approximately one month ago, she experienced similar symptoms and tested negative for COVID-19 and flu at a walk-in clinic. She associates the onset of her symptoms with attending a crafting event with friends.  No fever, chills, nausea, vomiting, or diarrhea. She experiences body aches localized to her back, attributed to coughing. She reports difficulty sleeping due to coughing, often sleeping in a recliner to alleviate symptoms.  She has been using liquid Motrin  for throat pain relief and has tried decaf tea with honey and lemon for her cough. She plans to purchase a new humidifier as she noticed moisture from wearing a mask helped her symptoms.  Her mother passed away in 2023/09/28, and she notes that her immune system feels weakened, making her more susceptible to respiratory infections. She has been avoiding crowds to prevent illness.  She is allergic to penicillin, which causes a rash, and has not taken antibiotics in several years. She recalls taking Biaxin in the  past for sinus issues.   Review of Systems  All other systems reviewed and are negative.       Allergies  Allergen Reactions   Penicillins Other (See Comments)    Reaction:  GI upset  Has patient had a PCN reaction causing immediate rash, facial/tongue/throat swelling, SOB or lightheadedness with hypotension: No Has patient had a PCN reaction causing severe rash involving mucus membranes or skin necrosis: No Has patient had a PCN reaction that required hospitalization No Has patient had a PCN reaction occurring within the last 10 years: No If all of the above answers are NO, then may proceed with Cephalosporin use.   Red Dye #40 (Allura Red) Other (See Comments)    Numb tongue   Oxycodone  Itching   Sulfa Antibiotics Itching and Rash    Current Outpatient Medications on File Prior to Visit  Medication Sig Dispense Refill   albuterol  (VENTOLIN  HFA) 108 (90 Base) MCG/ACT inhaler Inhale 2 puffs into the lungs every 6 (six) hours as needed for wheezing or shortness of breath. 18 g 0   atorvastatin  (LIPITOR) 10 MG tablet TAKE 1 TABLET(10 MG) BY MOUTH DAILY for cholesterol. 90 tablet 0   buPROPion  (WELLBUTRIN  XL) 150 MG 24 hr tablet Take 150 mg by mouth every morning.     cetirizine  (ZYRTEC ) 10 MG tablet Take 10 mg by mouth at bedtime.     famotidine  (PEPCID  AC) 10 MG tablet Take 10 mg by mouth as needed for heartburn or indigestion.     fluticasone  (FLONASE ) 50 MCG/ACT nasal spray SHAKE LIQUID AND USE 1 SPRAY IN  EACH NOSTRIL TWICE DAILY AS NEEDED FOR ALLERGIES OR RHINITIS 16 g 3   HYDROcodone -acetaminophen  (NORCO/VICODIN) 5-325 MG tablet Take 1 tablet by mouth 2 (two) times daily as needed for severe pain (pain score 7-10). Must last 30 days 60 tablet 0   HYDROcodone -acetaminophen  (NORCO/VICODIN) 5-325 MG tablet Take 1 tablet by mouth 2 (two) times daily. 60 tablet 0   hydrocortisone cream 0.5 % Apply 1 Application topically as needed for itching.     ibuprofen  (ADVIL ) 800 MG tablet  Take 800 mg by mouth 2 (two) times daily as needed.     lamoTRIgine  (LAMICTAL ) 200 MG tablet Take 300 mg by mouth at bedtime.      lisdexamfetamine (VYVANSE) 40 MG capsule Take 40 mg by mouth every morning.     LORazepam  (ATIVAN ) 1 MG tablet Take 0.5-1 mg by mouth at bedtime. Per pt takes one nightly regularly     metFORMIN  (GLUCOPHAGE -XR) 500 MG 24 hr tablet Take 1 tablet (500 mg total) by mouth daily with breakfast. For blood sugar 90 tablet 3   montelukast  (SINGULAIR ) 10 MG tablet Take 1 tablet (10 mg total) by mouth at bedtime. For allergies/asthma 90 tablet 3   PARoxetine  (PAXIL -CR) 25 MG 24 hr tablet Take 50 mg by mouth daily.     spironolactone  (ALDACTONE ) 25 MG tablet TAKE 1 TABLET(25 MG) BY MOUTH DAILY for blood pressure 90 tablet 3   CALCIUM -MAGNESIUM-VITAMIN D  ER PO Take 2 tablets by mouth daily. (Patient not taking: Reported on 01/10/2024)     RA ASPIRIN  EC ADULT LOW ST 81 MG EC tablet take 1 tablet by mouth once daily (Patient not taking: Reported on 01/10/2024) 30 tablet 0   No current facility-administered medications on file prior to visit.    BP 138/80 (BP Location: Left Arm, Patient Position: Sitting, Cuff Size: Large)   Pulse 92   Temp 98.7 F (37.1 C) (Oral)   Ht 5' 4 (1.626 m)   Wt 238 lb (108 kg)   SpO2 97%   BMI 40.85 kg/m   Objective:      Physical Exam GENERAL: Alert, cooperative, well developed, no acute distress. HEAD: Normocephalic atraumatic, no frontal or maxillary sinus tenderness. EYES: Extraocular movements intact bilaterally, pupils round. EARS: Tympanic membrane, ear canal and external ear normal bilaterally. NOSE: No congestion or rhinorrhea, mucous membranes are moist. THROAT: No tonsillar exudate, mild posterior oropharyngeal erythema, slightly enlarged tonsils BL. CARDIOVASCULAR: Normal heart rate and rhythm, S1 and S2 normal without murmurs. CHEST: Clear to auscultation bilaterally, no wheezes, rhonchi, or crackles. EXTREMITIES: No cyanosis  or edema. NEUROLOGICAL: Oriented to person, place and time, no gait abnormalities, moves all extremities without gross motor or sensory deficit. NECK: Cervical lymphadenopathy.         Assessment & Plan:   Assessment & Plan Acute sinusitis with persistent cough Symptoms lasting up to 2 weeks now with brief improvement in between and subsequent worsening suggest bacterial etiology, will treat with antibiotic. Allergy to penicillin noted, will use doxycycline . Cardiorespiratory exam benign, no suspicion for asthma exacerbation at this time, no indication for steroids.  - Prescribed doxycycline  100 mg BID for 5 days. - Advised taking doxycycline  with food and staying upright to prevent reflux. - Discussed potential side effects of doxycycline . - Prescribed Tessalonperles for cough, one capsule every 8 hours as needed for up to 14 days. - Advised follow-up if symptoms worsen or do not improve after antibiotics.   Return for worsening of symptoms or failure to improve.  Debra Dart K Delmus Warwick, MD  01/10/24     Contains text generated by Abridge.

## 2024-01-10 NOTE — Patient Instructions (Signed)
 Thank you for visiting [facility] today! Here's what we talked about: - START Doxycline and tessalon pearles - Schedule follow up if fever, worsening or unimproved symptoms

## 2024-01-16 ENCOUNTER — Ambulatory Visit: Attending: Anesthesiology | Admitting: Anesthesiology

## 2024-01-16 DIAGNOSIS — G8929 Other chronic pain: Secondary | ICD-10-CM

## 2024-01-16 DIAGNOSIS — M79605 Pain in left leg: Secondary | ICD-10-CM

## 2024-01-16 DIAGNOSIS — M545 Low back pain, unspecified: Secondary | ICD-10-CM | POA: Diagnosis not present

## 2024-01-16 DIAGNOSIS — Z79891 Long term (current) use of opiate analgesic: Secondary | ICD-10-CM

## 2024-01-16 DIAGNOSIS — M542 Cervicalgia: Secondary | ICD-10-CM | POA: Diagnosis not present

## 2024-01-16 DIAGNOSIS — M7918 Myalgia, other site: Secondary | ICD-10-CM

## 2024-01-16 DIAGNOSIS — Z79899 Other long term (current) drug therapy: Secondary | ICD-10-CM

## 2024-01-16 DIAGNOSIS — M47816 Spondylosis without myelopathy or radiculopathy, lumbar region: Secondary | ICD-10-CM

## 2024-01-16 DIAGNOSIS — M47812 Spondylosis without myelopathy or radiculopathy, cervical region: Secondary | ICD-10-CM

## 2024-01-16 DIAGNOSIS — G894 Chronic pain syndrome: Secondary | ICD-10-CM

## 2024-01-16 DIAGNOSIS — M5412 Radiculopathy, cervical region: Secondary | ICD-10-CM

## 2024-01-16 MED ORDER — HYDROCODONE-ACETAMINOPHEN 5-325 MG PO TABS: 1.0000 | ORAL_TABLET | Freq: Two times a day (BID) | ORAL | 0 refills | Status: DC | PRN

## 2024-01-16 MED ORDER — HYDROCODONE-ACETAMINOPHEN 5-325 MG PO TABS: 1.0000 | ORAL_TABLET | Freq: Two times a day (BID) | ORAL | 0 refills | Status: AC

## 2024-01-16 NOTE — Progress Notes (Signed)
 Virtual Visit via Telephone Note  I connected with Debra Myers on 01/16/24 at  9:40 AM EST by telephone and verified that I am speaking with the correct person using two identifiers.  Location: Patient: Home Provider: [facility] control center   I discussed the limitations, risks, security and privacy concerns of performing an evaluation and management service by telephone and the availability of in person appointments. I also discussed with the patient that there may be a patient responsible charge related to this service. The patient expressed understanding and agreed to proceed.   History of Present Illness: I spoke with Debra Myers via telephone as we were unable to link for the video portion of coverage.  She reports that she has had a chronic ongoing sinusitis for about 3 weeks and was recently placed on doxycycline .  She has been coughing a lot with some upper respiratory symptoms that are generally well-controlled but the coughing spasms has exacerbated her midthoracic back pain.  Her low back pain and neck pain are stable.  She continues to take her Vicodin very sparingly.  She averages about 2 of the 5 mg tablets per day.  She recently switched to taking one of her Vicodin tablets at bedtime to help with the cough and this was effective for her.  The quality characteristic and distribution of the low back pain and neck pain have been stable in nature with no recent changes.  She is trying to stay active and do her physical therapy regimen as well.  Otherwise she is in her usual state of health.  Review of systems: General: No fevers or chills Pulmonary: No shortness of breath or dyspnea Cardiac: No angina or palpitations or lightheadedness GI: No abdominal pain or constipation Psych: No depression    Observations/Objective:  Current Outpatient Medications:    [START ON 02/14/2024] HYDROcodone -acetaminophen  (NORCO/VICODIN) 5-325 MG tablet, Take 1 tablet by mouth 2 (two) times daily.,  Disp: 60 tablet, Rfl: 0   albuterol  (VENTOLIN  HFA) 108 (90 Base) MCG/ACT inhaler, Inhale 2 puffs into the lungs every 6 (six) hours as needed for wheezing or shortness of breath., Disp: 18 g, Rfl: 0   atorvastatin  (LIPITOR) 10 MG tablet, TAKE 1 TABLET(10 MG) BY MOUTH DAILY for cholesterol., Disp: 90 tablet, Rfl: 0   benzonatate  (TESSALON ) 200 MG capsule, Take 1 capsule (200 mg total) by mouth 3 (three) times daily as needed for up to 14 days for cough., Disp: 42 capsule, Rfl: 0   buPROPion  (WELLBUTRIN  XL) 150 MG 24 hr tablet, Take 150 mg by mouth every morning., Disp: , Rfl:    CALCIUM -MAGNESIUM-VITAMIN D  ER PO, Take 2 tablets by mouth daily. (Patient not taking: Reported on 01/10/2024), Disp: , Rfl:    cetirizine  (ZYRTEC ) 10 MG tablet, Take 10 mg by mouth at bedtime., Disp: , Rfl:    famotidine  (PEPCID  AC) 10 MG tablet, Take 10 mg by mouth as needed for heartburn or indigestion., Disp: , Rfl:    fluticasone  (FLONASE ) 50 MCG/ACT nasal spray, SHAKE LIQUID AND USE 1 SPRAY IN EACH NOSTRIL TWICE DAILY AS NEEDED FOR ALLERGIES OR RHINITIS, Disp: 16 g, Rfl: 3   HYDROcodone -acetaminophen  (NORCO/VICODIN) 5-325 MG tablet, Take 1 tablet by mouth 2 (two) times daily., Disp: 60 tablet, Rfl: 0   HYDROcodone -acetaminophen  (NORCO/VICODIN) 5-325 MG tablet, Take 1 tablet by mouth 2 (two) times daily as needed for severe pain (pain score 7-10). Must last 30 days, Disp: 60 tablet, Rfl: 0   hydrocortisone cream 0.5 %, Apply 1 Application  topically as needed for itching., Disp: , Rfl:    ibuprofen  (ADVIL ) 800 MG tablet, Take 800 mg by mouth 2 (two) times daily as needed., Disp: , Rfl:    lamoTRIgine  (LAMICTAL ) 200 MG tablet, Take 300 mg by mouth at bedtime. , Disp: , Rfl:    lisdexamfetamine (VYVANSE) 40 MG capsule, Take 40 mg by mouth every morning., Disp: , Rfl:    LORazepam  (ATIVAN ) 1 MG tablet, Take 0.5-1 mg by mouth at bedtime. Per pt takes one nightly regularly, Disp: , Rfl:    metFORMIN  (GLUCOPHAGE -XR) 500 MG 24 hr  tablet, Take 1 tablet (500 mg total) by mouth daily with breakfast. For blood sugar, Disp: 90 tablet, Rfl: 3   montelukast  (SINGULAIR ) 10 MG tablet, Take 1 tablet (10 mg total) by mouth at bedtime. For allergies/asthma, Disp: 90 tablet, Rfl: 3   PARoxetine  (PAXIL -CR) 25 MG 24 hr tablet, Take 50 mg by mouth daily., Disp: , Rfl:    RA ASPIRIN  EC ADULT LOW ST 81 MG EC tablet, take 1 tablet by mouth once daily (Patient not taking: Reported on 01/10/2024), Disp: 30 tablet, Rfl: 0   spironolactone  (ALDACTONE ) 25 MG tablet, TAKE 1 TABLET(25 MG) BY MOUTH DAILY for blood pressure, Disp: 90 tablet, Rfl: 3  Past Medical History:  Diagnosis Date   Bulging lumbar disc (L3-4) 02/26/2015   Chronic pain syndrome    followed by pain clinic;    back, neck, lower extremities   DDD (degenerative disc disease), cervical    DDD (degenerative disc disease), lumbar    GAD (generalized anxiety disorder)    GERD (gastroesophageal reflux disease)    History of transient ischemic attack (TIA) 06/11/2015   admission in epic--  (02-10-2022 per pt no resiudal's and no s&s since)   Hypertension    MDD (major depressive disorder)    Mild intermittent asthma    followed  by pcp   PCOS (polycystic ovarian syndrome)    Pre-diabetes    Retained intrauterine contraceptive device (IUD)    Seasonal allergic rhinitis    Wears contact lenses     Assessment and Plan:  1. Encounter for medication management   2. Chronic low back pain (1ry area of Pain) (Bilateral) (L>R)   3. Chronic lower extremity pain (2ry area of Pain) (Left)   4. Chronic neck pain (3ry area of Pain) (Bilateral) (L>R)   5. Cervical facet syndrome (Bilateral) (L>R)   6. Lumbar facet syndrome (Bilateral) (L>R)   7. Chronic cervical radicular pain (Bilateral) (L>R)   8. Musculoskeletal pain   9. Chronic pain syndrome   10. Chronic use of opiate for therapeutic purpose   11. Pharmacologic therapy   12. Encounter for chronic pain management    Based on  our conversation today and after review of the [place]  practitioner database information I think is appropriate to refill her medicines.  Refills will be generated for November 24 and December 23.  This will be for Vicodin 5 mg tablets twice a day.  No other changes in her regimen are initiated.  I encouraged her to continue with her antibiotic therapy and continue follow-up with her primary care physicians for baseline medical care with scheduled return to clinic in 2 months. Follow Up Instructions:    I discussed the assessment and treatment plan with the patient. The patient was provided an opportunity to ask questions and all were answered. The patient agreed with the plan and demonstrated an understanding of the instructions.   The patient  was advised to call back or seek an in-person evaluation if the symptoms worsen or if the condition fails to improve as anticipated.  I provided 30 minutes of non-face-to-face time during this encounter.   Lynwood KANDICE Clause, MD

## 2024-01-17 ENCOUNTER — Encounter: Payer: Managed Care, Other (non HMO) | Admitting: Primary Care

## 2024-01-26 ENCOUNTER — Encounter: Admitting: Anesthesiology

## 2024-01-30 ENCOUNTER — Other Ambulatory Visit: Payer: Self-pay

## 2024-01-30 DIAGNOSIS — L680 Hirsutism: Secondary | ICD-10-CM

## 2024-01-30 DIAGNOSIS — E781 Pure hyperglyceridemia: Secondary | ICD-10-CM

## 2024-01-30 DIAGNOSIS — E282 Polycystic ovarian syndrome: Secondary | ICD-10-CM

## 2024-01-30 DIAGNOSIS — R7303 Prediabetes: Secondary | ICD-10-CM

## 2024-01-30 DIAGNOSIS — J309 Allergic rhinitis, unspecified: Secondary | ICD-10-CM

## 2024-01-30 NOTE — Telephone Encounter (Signed)
 10/20 message you wanted patient to call and set up Cpe don't see where that was scheduled.

## 2024-02-02 ENCOUNTER — Ambulatory Visit: Payer: Self-pay | Admitting: Primary Care

## 2024-02-02 ENCOUNTER — Ambulatory Visit: Admitting: Primary Care

## 2024-02-02 ENCOUNTER — Encounter: Payer: Self-pay | Admitting: Primary Care

## 2024-02-02 VITALS — BP 134/76 | HR 83 | Temp 97.9°F | Ht 62.5 in | Wt 236.5 lb

## 2024-02-02 DIAGNOSIS — Z0000 Encounter for general adult medical examination without abnormal findings: Secondary | ICD-10-CM

## 2024-02-02 DIAGNOSIS — K219 Gastro-esophageal reflux disease without esophagitis: Secondary | ICD-10-CM

## 2024-02-02 DIAGNOSIS — J4520 Mild intermittent asthma, uncomplicated: Secondary | ICD-10-CM

## 2024-02-02 DIAGNOSIS — G894 Chronic pain syndrome: Secondary | ICD-10-CM

## 2024-02-02 DIAGNOSIS — I10 Essential (primary) hypertension: Secondary | ICD-10-CM

## 2024-02-02 DIAGNOSIS — F411 Generalized anxiety disorder: Secondary | ICD-10-CM

## 2024-02-02 DIAGNOSIS — R7303 Prediabetes: Secondary | ICD-10-CM

## 2024-02-02 DIAGNOSIS — F50819 Binge eating disorder, unspecified: Secondary | ICD-10-CM

## 2024-02-02 DIAGNOSIS — E781 Pure hyperglyceridemia: Secondary | ICD-10-CM

## 2024-02-02 LAB — LIPID PANEL
Cholesterol: 128 mg/dL (ref 0–200)
HDL: 51 mg/dL (ref >39.00)
LDL Cholesterol: 60 mg/dL (ref 0–99)
NonHDL: 77.01
Total CHOL/HDL Ratio: 3
Triglycerides: 87 mg/dL (ref 0.0–149.0)
VLDL: 17.4 mg/dL (ref 0.0–40.0)

## 2024-02-02 LAB — HEMOGLOBIN A1C: Hgb A1c MFr Bld: 6.2 % (ref 4.6–6.5)

## 2024-02-02 LAB — COMPREHENSIVE METABOLIC PANEL WITH GFR
ALT: 17 U/L (ref 0–35)
AST: 14 U/L (ref 0–37)
Albumin: 4.1 g/dL (ref 3.5–5.2)
Alkaline Phosphatase: 94 U/L (ref 39–117)
BUN: 13 mg/dL (ref 6–23)
CO2: 28 meq/L (ref 19–32)
Calcium: 8.8 mg/dL (ref 8.4–10.5)
Chloride: 105 meq/L (ref 96–112)
Creatinine, Ser: 0.84 mg/dL (ref 0.40–1.20)
GFR: 77.08 mL/min (ref >60.00)
Glucose, Bld: 107 mg/dL — ABNORMAL HIGH (ref 70–99)
Potassium: 4.3 meq/L (ref 3.5–5.1)
Sodium: 140 meq/L (ref 135–145)
Total Bilirubin: 0.6 mg/dL (ref 0.2–1.2)
Total Protein: 6.3 g/dL (ref 6.0–8.3)

## 2024-02-02 MED ORDER — ATORVASTATIN CALCIUM 10 MG PO TABS: ORAL_TABLET | ORAL | 3 refills | Status: DC

## 2024-02-02 MED ORDER — MONTELUKAST SODIUM 10 MG PO TABS: 10.0000 mg | ORAL_TABLET | Freq: Every day | ORAL | 3 refills | Status: AC

## 2024-02-02 MED ORDER — METFORMIN HCL ER 500 MG PO TB24: 500.0000 mg | ORAL_TABLET | Freq: Every day | ORAL | 3 refills | Status: AC

## 2024-02-02 MED ORDER — SPIRONOLACTONE 25 MG PO TABS: ORAL_TABLET | ORAL | 3 refills | Status: AC

## 2024-02-02 NOTE — Assessment & Plan Note (Signed)
 Repeat A1C pending.  Work on a healthy diet and regular exercise in order for weight loss, and to reduce the risk of further co-morbidity. Continue metformin  ER 500 mg daily.

## 2024-02-02 NOTE — Assessment & Plan Note (Signed)
 Following with pain management, reviewed office notes from November 2025.  Continue Norco 5/325 mg BID PRN.

## 2024-02-02 NOTE — Assessment & Plan Note (Signed)
 Waxes and wanes.  No concerns today.  Continue Singulair  10 mg HS, albuterol  inhaler PRN.

## 2024-02-02 NOTE — Patient Instructions (Signed)
 Stop by the lab prior to leaving today. I will notify you of your results once received.   It was a pleasure to see you today!

## 2024-02-02 NOTE — Progress Notes (Signed)
 Subjective:    Patient ID: Debra Myers, female    DOB: May 18, 1965, 58 y.o.   MRN: [id number]  Debra Myers is a very pleasant 58 y.o. female who presents today for complete physical and follow up of chronic conditions.  She would also like to discuss recurrent URI symptoms. She's experienced intermittent cough/cold symptoms for the last 2 months. Evaluated by Dr. Bennett last month, is feeling better but has ongoing dry cough. She began taking lansoprazole for the last week have helped.   Immunizations: -Tetanus: Completed in 2018 -Influenza: she will obtain at drug store. -Shingles: Completed Shingrix vaccine x 1  Diet: Fair diet.  Exercise: No regular exercise.  Eye exam: Completes annually  Dental exam: Completes semi-annually    Pap Smear: Completes per GYN Mammogram: Completed in September 2025  Colonoscopy: Completed in 2025, due 2030  BP Readings from Last 3 Encounters:  02/02/24 134/76  01/10/24 138/80  12/13/23 139/81       Review of Systems  Constitutional:  Negative for unexpected weight change.  HENT:  Negative for rhinorrhea.   Respiratory:  Positive for cough. Negative for shortness of breath.   Cardiovascular:  Negative for chest pain.  Gastrointestinal:  Negative for constipation and diarrhea.  Genitourinary:  Negative for difficulty urinating.  Musculoskeletal:  Positive for arthralgias.  Skin:  Negative for rash.  Allergic/Immunologic: Positive for environmental allergies.  Neurological:  Negative for dizziness, numbness and headaches.  Psychiatric/Behavioral:  The patient is not nervous/anxious.          Past Medical History:  Diagnosis Date   Bulging lumbar disc (L3-4) 02/26/2015   Chronic pain syndrome    followed by pain clinic;    back, neck, lower extremities   DDD (degenerative disc disease), cervical    DDD (degenerative disc disease), lumbar    GAD (generalized anxiety disorder)    GERD (gastroesophageal reflux disease)     History of transient ischemic attack (TIA) 06/11/2015   admission in epic--  (02-10-2022 per pt no resiudal's and no s&s since)   Hypertension    MDD (major depressive disorder)    Mild intermittent asthma    followed  by pcp   PCOS (polycystic ovarian syndrome)    Pre-diabetes    Retained intrauterine contraceptive device (IUD)    Seasonal allergic rhinitis    Wears contact lenses     Social History   Socioeconomic History   Marital status: Married    Spouse name: Not on file   Number of children: Not on file   Years of education: Not on file   Highest education level: Not on file  Occupational History   Not on file  Tobacco Use   Smoking status: Never   Smokeless tobacco: Never  Vaping Use   Vaping status: Never Used  Substance and Sexual Activity   Alcohol use: Never   Drug use: Never   Sexual activity: Yes    Comment: 1st intercourse 58 yo-Fewer than 5 partners-Mirena   inserted 09-2014  Other Topics Concern   Not on file  Social History Narrative   Not on file   Social Drivers of Health   Tobacco Use: Low Risk (02/02/2024)   Patient History    Smoking Tobacco Use: Never    Smokeless Tobacco Use: Never    Passive Exposure: Not on file  Financial Resource Strain: Not on file  Food Insecurity: Not on file  Transportation Needs: Not on file  Physical Activity: Not on file  Stress: Not on file  Social Connections: Not on file  Intimate Partner Violence: Not on file  Depression (PHQ2-9): Low Risk (02/02/2024)   Depression (PHQ2-9)    PHQ-2 Score: 1  Alcohol Screen: Not on file  Housing: Not on file  Utilities: Not on file  Health Literacy: Not on file    Past Surgical History:  Procedure Laterality Date   CHOLECYSTECTOMY, LAPAROSCOPIC  11/22/1999   @[facility]  by dr d. vernetta   DILATATION & CURETTAGE/HYSTEROSCOPY WITH MYOSURE N/A 09/10/2014   Procedure: DILATATION & CURETTAGE/HYSTEROSCOPY WITH MYOSURE/INSERTION OF IUD;  Surgeon: Curlee VEAR Guan, MD;   Location: [facility];  Service: Gynecology;  Laterality: N/A;   HYSTEROSCOPY N/A 02/11/2022   Procedure: HYSTEROSCOPY WITH MYOSURE RESECTION OF ENDOMETRIAL POLYP, DILATION AND CURETTAGE;  Surgeon: Cathlyn JAYSON Nikki Bobie FORBES, MD;  Location: [facility];  Service: Gynecology;  Laterality: N/A;   IUD REMOVAL N/A 02/11/2022   Procedure: INTRAUTERINE DEVICE (IUD) REMOVAL;  Surgeon: Cathlyn JAYSON Nikki Bobie FORBES, MD;  Location: [facility];  Service: Gynecology;  Laterality: N/A;   LAPAROSCOPIC GASTRIC BANDING  04/2012   WISDOM TOOTH EXTRACTION      Family History  Problem Relation Age of Onset   Cancer Mother        thyroid  cancer   Hypertension Mother    Hypertension Father    Diabetes Father    Asthma Maternal Grandmother    Stroke Maternal Grandmother    Stroke Maternal Grandfather    Colon cancer Neg Hx    Rectal cancer Neg Hx    Stomach cancer Neg Hx     Allergies[1]  Medications Ordered Prior to Encounter[2]  BP 134/76   Pulse 83   Temp 97.9 F (36.6 C) (Oral)   Ht 5' 2.5 (1.588 m)   Wt 236 lb 8 oz (107.3 kg)   SpO2 95%   BMI 42.57 kg/m  Objective:   Physical Exam HENT:     Right Ear: Tympanic membrane and ear canal normal.     Left Ear: Tympanic membrane and ear canal normal.  Eyes:     Pupils: Pupils are equal, round, and reactive to light.  Cardiovascular:     Rate and Rhythm: Normal rate and regular rhythm.  Pulmonary:     Effort: Pulmonary effort is normal.     Breath sounds: Normal breath sounds.  Abdominal:     General: Bowel sounds are normal.     Palpations: Abdomen is soft.     Tenderness: There is no abdominal tenderness.  Musculoskeletal:        General: Normal range of motion.     Cervical back: Neck supple.  Skin:    General: Skin is warm and dry.  Neurological:     Mental Status: She is alert and oriented to person, place, and time.     Cranial Nerves: No cranial nerve deficit.     Deep Tendon Reflexes:     Reflex  Scores:      Patellar reflexes are 2+ on the right side and 2+ on the left side. Psychiatric:        Mood and Affect: Mood normal.     Physical Exam        Assessment & Plan:  Preventative health care Assessment & Plan: Immunizations UTD. Pap smear UTD. Follows with GYN Mammogram UTD Colonoscopy UTD, due 2030  Discussed the importance of a healthy diet and regular exercise in order for weight loss, and to reduce the  risk of further co-morbidity.  Exam stable. Labs pending.  Follow up in 1 year for repeat physical.    Primary hypertension Assessment & Plan: Stable.  Continue spironolactone  25 mg daily. CMP pending.  Orders: -     Comprehensive metabolic panel with GFR  Mild intermittent asthma without complication Assessment & Plan: Waxes and wanes.  No concerns today.  Continue Singulair  10 mg HS, albuterol  inhaler PRN.    Gastroesophageal reflux disease, unspecified whether esophagitis present Assessment & Plan: Current cough since ongoing URIs.  Discussed to continue lansoprazole OTC as this is helping. Transition back to famotidine  10 mg when ready.   HYPERTRIGLYCERIDEMIA Assessment & Plan: Repeat lipid panel pending.  Work on a healthy diet and regular exercise in order for weight loss, and to reduce the risk of further co-morbidity.  Continue atorvastatin  10 mg daily.  Orders: -     Comprehensive metabolic panel with GFR -     Lipid panel  Generalized anxiety disorder Assessment & Plan: Deteriorated with mother's health.  Following with psychiatry.  Continue bupropion  XL 150 mg daily, Lamictal  300 mg HS, Vyvanse 40 mg daily, lorazepam  1 mg PRN, Paxil  CR 25 mg daily.    Chronic pain syndrome Assessment & Plan: Following with pain management, reviewed office notes from November 2025.  Continue Norco 5/325 mg BID PRN.   Binge eating disorder, unspecified severity Assessment & Plan: Following with psychiatry.   Continue Vyvanse  40 mg daily.   Prediabetes Assessment & Plan: Repeat A1C pending.  Work on a healthy diet and regular exercise in order for weight loss, and to reduce the risk of further co-morbidity. Continue metformin  ER 500 mg daily.   Orders: -     Comprehensive metabolic panel with GFR -     Hemoglobin A1c    Assessment and Plan Assessment & Plan         Comer MARLA Gaskins, NP        [1]  Allergies Allergen Reactions   Penicillins Other (See Comments)    Reaction:  GI upset  Has patient had a PCN reaction causing immediate rash, facial/tongue/throat swelling, SOB or lightheadedness with hypotension: No Has patient had a PCN reaction causing severe rash involving mucus membranes or skin necrosis: No Has patient had a PCN reaction that required hospitalization No Has patient had a PCN reaction occurring within the last 10 years: No If all of the above answers are NO, then may proceed with Cephalosporin use.   Red Dye #40 (Allura Red) Other (See Comments)    Numb tongue   Oxycodone  Itching   Sulfa Antibiotics Itching and Rash  [2]  Current Outpatient Medications on File Prior to Visit  Medication Sig Dispense Refill   albuterol  (VENTOLIN  HFA) 108 (90 Base) MCG/ACT inhaler Inhale 2 puffs into the lungs every 6 (six) hours as needed for wheezing or shortness of breath. 18 g 0   atorvastatin  (LIPITOR) 10 MG tablet TAKE 1 TABLET(10 MG) BY MOUTH DAILY for cholesterol. 90 tablet 0   buPROPion  (WELLBUTRIN  XL) 150 MG 24 hr tablet Take 150 mg by mouth every morning.     CALCIUM -MAGNESIUM-VITAMIN D  ER PO Take 2 tablets by mouth daily.     cetirizine  (ZYRTEC ) 10 MG tablet Take 10 mg by mouth at bedtime.     famotidine  (PEPCID  AC) 10 MG tablet Take 10 mg by mouth as needed for heartburn or indigestion.     fluticasone  (FLONASE ) 50 MCG/ACT nasal spray SHAKE LIQUID AND USE 1  SPRAY IN EACH NOSTRIL TWICE DAILY AS NEEDED FOR ALLERGIES OR RHINITIS 16 g 3   HYDROcodone -acetaminophen   (NORCO/VICODIN) 5-325 MG tablet Take 1 tablet by mouth 2 (two) times daily. 60 tablet 0   HYDROcodone -acetaminophen  (NORCO/VICODIN) 5-325 MG tablet Take 1 tablet by mouth 2 (two) times daily as needed for severe pain (pain score 7-10). Must last 30 days 60 tablet 0   [START ON 02/14/2024] HYDROcodone -acetaminophen  (NORCO/VICODIN) 5-325 MG tablet Take 1 tablet by mouth 2 (two) times daily. 60 tablet 0   hydrocortisone cream 0.5 % Apply 1 Application topically as needed for itching.     ibuprofen  (ADVIL ) 800 MG tablet Take 800 mg by mouth 2 (two) times daily as needed.     lamoTRIgine  (LAMICTAL ) 200 MG tablet Take 300 mg by mouth at bedtime.      lisdexamfetamine (VYVANSE) 40 MG capsule Take 40 mg by mouth every morning.     LORazepam  (ATIVAN ) 1 MG tablet Take 0.5-1 mg by mouth at bedtime. Per pt takes one nightly regularly     metFORMIN  (GLUCOPHAGE -XR) 500 MG 24 hr tablet Take 1 tablet (500 mg total) by mouth daily with breakfast. For blood sugar 90 tablet 3   montelukast  (SINGULAIR ) 10 MG tablet Take 1 tablet (10 mg total) by mouth at bedtime. For allergies/asthma 90 tablet 3   PARoxetine  (PAXIL -CR) 25 MG 24 hr tablet Take 50 mg by mouth daily.     spironolactone  (ALDACTONE ) 25 MG tablet TAKE 1 TABLET(25 MG) BY MOUTH DAILY for blood pressure 90 tablet 3   RA ASPIRIN  EC ADULT LOW ST 81 MG EC tablet take 1 tablet by mouth once daily (Patient not taking: Reported on 02/02/2024) 30 tablet 0   No current facility-administered medications on file prior to visit.

## 2024-02-02 NOTE — Assessment & Plan Note (Signed)
 Stable.  Continue spironolactone  25 mg daily. CMP pending.

## 2024-02-02 NOTE — Assessment & Plan Note (Signed)
 Deteriorated with mother's health.  Following with psychiatry.  Continue bupropion  XL 150 mg daily, Lamictal  300 mg HS, Vyvanse 40 mg daily, lorazepam  1 mg PRN, Paxil  CR 25 mg daily.

## 2024-02-02 NOTE — Assessment & Plan Note (Signed)
Following with psychiatry.  Continue Vyvanse 40 mg daily.

## 2024-02-02 NOTE — Assessment & Plan Note (Signed)
 Repeat lipid panel pending.  Work on a healthy diet and regular exercise in order for weight loss, and to reduce the risk of further co-morbidity.  Continue atorvastatin  10 mg daily.

## 2024-02-02 NOTE — Assessment & Plan Note (Signed)
 Immunizations UTD. Pap smear UTD. Follows with GYN Mammogram UTD Colonoscopy UTD, due 2030  Discussed the importance of a healthy diet and regular exercise in order for weight loss, and to reduce the risk of further co-morbidity.  Exam stable. Labs pending.  Follow up in 1 year for repeat physical.

## 2024-02-02 NOTE — Assessment & Plan Note (Signed)
 Current cough since ongoing URIs.  Discussed to continue lansoprazole OTC as this is helping. Transition back to famotidine  10 mg when ready.

## 2024-03-02 ENCOUNTER — Other Ambulatory Visit: Payer: Self-pay | Admitting: Anesthesiology

## 2024-03-07 ENCOUNTER — Other Ambulatory Visit: Payer: Self-pay | Admitting: *Deleted

## 2024-03-07 DIAGNOSIS — M62838 Other muscle spasm: Secondary | ICD-10-CM

## 2024-03-07 DIAGNOSIS — M7918 Myalgia, other site: Secondary | ICD-10-CM

## 2024-03-07 NOTE — Progress Notes (Unsigned)
 Safety precautions to be maintained throughout the outpatient stay will include: orient to surroundings, keep bed in low position, maintain call bell within reach at all times, provide assistance with transfer out of bed and ambulation.

## 2024-03-08 ENCOUNTER — Other Ambulatory Visit: Payer: Self-pay | Admitting: *Deleted

## 2024-03-08 DIAGNOSIS — E781 Pure hyperglyceridemia: Secondary | ICD-10-CM

## 2024-03-08 MED ORDER — ATORVASTATIN CALCIUM 10 MG PO TABS: ORAL_TABLET | ORAL | 2 refills | Status: AC

## 2024-03-20 ENCOUNTER — Ambulatory Visit: Admitting: Anesthesiology

## 2024-03-20 DIAGNOSIS — Z79891 Long term (current) use of opiate analgesic: Secondary | ICD-10-CM

## 2024-03-20 DIAGNOSIS — G894 Chronic pain syndrome: Secondary | ICD-10-CM

## 2024-03-20 DIAGNOSIS — M545 Low back pain, unspecified: Secondary | ICD-10-CM

## 2024-03-20 DIAGNOSIS — M47816 Spondylosis without myelopathy or radiculopathy, lumbar region: Secondary | ICD-10-CM

## 2024-03-20 DIAGNOSIS — Z79899 Other long term (current) drug therapy: Secondary | ICD-10-CM

## 2024-03-20 DIAGNOSIS — G8929 Other chronic pain: Secondary | ICD-10-CM

## 2024-03-20 DIAGNOSIS — M7918 Myalgia, other site: Secondary | ICD-10-CM

## 2024-03-20 DIAGNOSIS — M47812 Spondylosis without myelopathy or radiculopathy, cervical region: Secondary | ICD-10-CM

## 2024-03-20 MED ORDER — HYDROCODONE-ACETAMINOPHEN 5-325 MG PO TABS: 1.0000 | ORAL_TABLET | Freq: Two times a day (BID) | ORAL | 0 refills | Status: AC | PRN

## 2024-03-20 MED ORDER — HYDROCODONE-ACETAMINOPHEN 5-325 MG PO TABS: 1.0000 | ORAL_TABLET | Freq: Two times a day (BID) | ORAL | 0 refills | Status: AC

## 2024-03-20 MED ORDER — METAXALONE 800 MG PO TABS: 800.0000 mg | ORAL_TABLET | Freq: Two times a day (BID) | ORAL | 5 refills | Status: AC
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
## 2025-02-05 ENCOUNTER — Encounter: Admitting: Primary Care
